# Patient Record
Sex: Female | Born: 1955
Health system: Southern US, Community
[De-identification: ages and names within clinical notes are randomized; demographics above are authoritative.]

## PROBLEM LIST (undated history)

## (undated) DIAGNOSIS — R7303 Prediabetes: Secondary | ICD-10-CM

## (undated) DIAGNOSIS — K219 Gastro-esophageal reflux disease without esophagitis: Secondary | ICD-10-CM

## (undated) DIAGNOSIS — I10 Essential (primary) hypertension: Secondary | ICD-10-CM

## (undated) DIAGNOSIS — G8929 Other chronic pain: Secondary | ICD-10-CM

## (undated) DIAGNOSIS — Z114 Encounter for screening for human immunodeficiency virus [HIV]: Secondary | ICD-10-CM

## (undated) DIAGNOSIS — I700 Atherosclerosis of aorta: Secondary | ICD-10-CM

## (undated) DIAGNOSIS — M1990 Unspecified osteoarthritis, unspecified site: Secondary | ICD-10-CM

## (undated) DIAGNOSIS — F909 Attention-deficit hyperactivity disorder, unspecified type: Secondary | ICD-10-CM

## (undated) DIAGNOSIS — N179 Acute kidney failure, unspecified: Secondary | ICD-10-CM

## (undated) DIAGNOSIS — M87242 Osteonecrosis due to previous trauma, left hand: Secondary | ICD-10-CM

## (undated) DIAGNOSIS — I272 Pulmonary hypertension, unspecified: Secondary | ICD-10-CM

## (undated) DIAGNOSIS — A6009 Herpesviral infection of other urogenital tract: Secondary | ICD-10-CM

## (undated) DIAGNOSIS — E038 Other specified hypothyroidism: Secondary | ICD-10-CM

## (undated) DIAGNOSIS — Z8719 Personal history of other diseases of the digestive system: Secondary | ICD-10-CM

## (undated) DIAGNOSIS — E785 Hyperlipidemia, unspecified: Secondary | ICD-10-CM

## (undated) DIAGNOSIS — I2510 Atherosclerotic heart disease of native coronary artery without angina pectoris: Secondary | ICD-10-CM

## (undated) DIAGNOSIS — F419 Anxiety disorder, unspecified: Secondary | ICD-10-CM

## (undated) DIAGNOSIS — M549 Dorsalgia, unspecified: Secondary | ICD-10-CM

## (undated) DIAGNOSIS — Z8711 Personal history of peptic ulcer disease: Secondary | ICD-10-CM

## (undated) DIAGNOSIS — R6889 Other general symptoms and signs: Secondary | ICD-10-CM

## (undated) DIAGNOSIS — IMO0002 Reserved for concepts with insufficient information to code with codable children: Secondary | ICD-10-CM

## (undated) HISTORY — DX: Atherosclerosis of aorta: I70.0

## (undated) HISTORY — PX: TONSILLECTOMY: SUR1361

## (undated) HISTORY — DX: Other specified hypothyroidism: E03.8

## (undated) HISTORY — DX: Herpesviral infection of other urogenital tract: A60.09

## (undated) HISTORY — DX: Hyperlipidemia, unspecified: E78.5

## (undated) HISTORY — PX: CARDIAC CATHETERIZATION: SHX172

## (undated) HISTORY — DX: Reserved for concepts with insufficient information to code with codable children: IMO0002

## (undated) HISTORY — DX: Other chronic pain: G89.29

## (undated) HISTORY — DX: Pulmonary hypertension, unspecified: I27.20

## (undated) HISTORY — DX: Gastro-esophageal reflux disease without esophagitis: K21.9

## (undated) HISTORY — DX: Atherosclerotic heart disease of native coronary artery without angina pectoris: I25.10

## (undated) HISTORY — PX: COLON SURGERY: SHX602

## (undated) HISTORY — DX: Essential (primary) hypertension: I10

## (undated) HISTORY — DX: Prediabetes: R73.03

## (undated) HISTORY — DX: Unspecified osteoarthritis, unspecified site: M19.90

## (undated) HISTORY — DX: Attention-deficit hyperactivity disorder, unspecified type: F90.9

## (undated) HISTORY — PX: APPENDECTOMY: SHX54

## (undated) HISTORY — PX: EYE SURGERY: SHX253

## (undated) HISTORY — PX: CORONARY ANGIOPLASTY: SHX604

## (undated) HISTORY — PX: SMALL INTESTINE SURGERY: SHX150

## (undated) HISTORY — DX: Osteonecrosis due to previous trauma, left hand: M87.242

## (undated) HISTORY — DX: Other general symptoms and signs: R68.89

## (undated) HISTORY — DX: Dorsalgia, unspecified: M54.9

## (undated) HISTORY — DX: Acute kidney failure, unspecified: N17.9

## (undated) HISTORY — DX: Anxiety disorder, unspecified: F41.9

## (undated) HISTORY — PX: TUBAL LIGATION: SHX77

---

## 2003-05-23 ENCOUNTER — Other Ambulatory Visit: Payer: Self-pay

## 2004-02-15 ENCOUNTER — Emergency Department: Payer: Self-pay | Admitting: General Practice

## 2004-02-15 ENCOUNTER — Other Ambulatory Visit: Payer: Self-pay

## 2004-08-10 ENCOUNTER — Emergency Department: Payer: Self-pay | Admitting: Emergency Medicine

## 2004-11-24 ENCOUNTER — Inpatient Hospital Stay: Payer: Self-pay | Admitting: Internal Medicine

## 2004-11-24 ENCOUNTER — Other Ambulatory Visit: Payer: Self-pay

## 2004-11-25 ENCOUNTER — Other Ambulatory Visit: Payer: Self-pay

## 2006-04-02 ENCOUNTER — Emergency Department: Payer: Self-pay | Admitting: Unknown Physician Specialty

## 2007-04-01 ENCOUNTER — Emergency Department: Payer: Self-pay | Admitting: Emergency Medicine

## 2007-05-06 ENCOUNTER — Other Ambulatory Visit: Payer: Self-pay

## 2007-05-06 ENCOUNTER — Emergency Department: Payer: Self-pay | Admitting: Internal Medicine

## 2007-08-31 ENCOUNTER — Inpatient Hospital Stay (HOSPITAL_COMMUNITY): Admission: EM | Admit: 2007-08-31 | Discharge: 2007-09-01 | Payer: Self-pay | Admitting: Emergency Medicine

## 2007-08-31 ENCOUNTER — Ambulatory Visit: Payer: Self-pay | Admitting: Cardiology

## 2008-05-12 ENCOUNTER — Inpatient Hospital Stay: Payer: Self-pay | Admitting: Psychiatry

## 2008-11-27 ENCOUNTER — Emergency Department: Payer: Self-pay | Admitting: Emergency Medicine

## 2009-01-16 ENCOUNTER — Emergency Department (HOSPITAL_COMMUNITY): Admission: EM | Admit: 2009-01-16 | Discharge: 2009-01-16 | Payer: Self-pay | Admitting: Emergency Medicine

## 2009-09-15 ENCOUNTER — Inpatient Hospital Stay (HOSPITAL_COMMUNITY): Admission: EM | Admit: 2009-09-15 | Discharge: 2009-09-18 | Payer: Self-pay | Admitting: Emergency Medicine

## 2009-09-15 ENCOUNTER — Ambulatory Visit: Payer: Self-pay | Admitting: Internal Medicine

## 2009-09-16 ENCOUNTER — Encounter: Payer: Self-pay | Admitting: Cardiovascular Disease

## 2009-09-18 ENCOUNTER — Telehealth: Payer: Self-pay | Admitting: Cardiovascular Disease

## 2009-09-21 ENCOUNTER — Inpatient Hospital Stay (HOSPITAL_COMMUNITY): Admission: EM | Admit: 2009-09-21 | Discharge: 2009-09-23 | Payer: Self-pay | Admitting: Emergency Medicine

## 2009-09-21 ENCOUNTER — Ambulatory Visit: Payer: Self-pay | Admitting: Cardiology

## 2009-10-02 DIAGNOSIS — I2510 Atherosclerotic heart disease of native coronary artery without angina pectoris: Secondary | ICD-10-CM | POA: Insufficient documentation

## 2009-10-02 DIAGNOSIS — I10 Essential (primary) hypertension: Secondary | ICD-10-CM | POA: Insufficient documentation

## 2009-10-02 DIAGNOSIS — I25119 Atherosclerotic heart disease of native coronary artery with unspecified angina pectoris: Secondary | ICD-10-CM | POA: Insufficient documentation

## 2009-10-03 ENCOUNTER — Ambulatory Visit: Payer: Self-pay | Admitting: Cardiovascular Disease

## 2009-10-03 DIAGNOSIS — E785 Hyperlipidemia, unspecified: Secondary | ICD-10-CM | POA: Insufficient documentation

## 2009-10-23 ENCOUNTER — Emergency Department (HOSPITAL_COMMUNITY): Admission: EM | Admit: 2009-10-23 | Discharge: 2009-10-23 | Payer: Self-pay | Admitting: Emergency Medicine

## 2009-11-05 ENCOUNTER — Encounter: Payer: Self-pay | Admitting: Cardiovascular Disease

## 2009-11-07 ENCOUNTER — Encounter: Payer: Self-pay | Admitting: Cardiovascular Disease

## 2009-11-08 ENCOUNTER — Inpatient Hospital Stay: Payer: Medicare Other | Admitting: General Surgery

## 2009-11-08 ENCOUNTER — Ambulatory Visit: Payer: Self-pay | Admitting: Cardiovascular Disease

## 2009-11-09 LAB — PATHOLOGY REPORT

## 2009-11-16 ENCOUNTER — Encounter: Payer: Self-pay | Admitting: Cardiovascular Disease

## 2009-11-22 ENCOUNTER — Ambulatory Visit: Payer: Medicare Other | Admitting: Oncology

## 2009-11-22 ENCOUNTER — Inpatient Hospital Stay: Payer: Medicare Other | Admitting: General Surgery

## 2009-11-26 ENCOUNTER — Encounter: Payer: Self-pay | Admitting: Cardiovascular Disease

## 2009-11-27 ENCOUNTER — Encounter: Payer: Self-pay | Admitting: Cardiovascular Disease

## 2009-11-29 ENCOUNTER — Encounter: Payer: Self-pay | Admitting: Cardiovascular Disease

## 2009-12-13 ENCOUNTER — Ambulatory Visit: Payer: Medicare Other | Admitting: Oncology

## 2010-01-15 ENCOUNTER — Ambulatory Visit: Payer: Self-pay | Admitting: Cardiovascular Disease

## 2010-01-18 ENCOUNTER — Encounter: Payer: Self-pay | Admitting: Cardiovascular Disease

## 2010-01-31 ENCOUNTER — Telehealth: Payer: Self-pay | Admitting: Cardiovascular Disease

## 2010-02-06 ENCOUNTER — Telehealth: Payer: Self-pay | Admitting: Cardiovascular Disease

## 2010-02-22 ENCOUNTER — Emergency Department: Payer: Medicare Other | Admitting: Emergency Medicine

## 2010-03-29 ENCOUNTER — Emergency Department: Payer: Self-pay | Admitting: Emergency Medicine

## 2010-04-02 ENCOUNTER — Telehealth: Payer: Self-pay | Admitting: Cardiovascular Disease

## 2010-04-29 ENCOUNTER — Telehealth: Payer: Self-pay | Admitting: Cardiovascular Disease

## 2010-05-05 ENCOUNTER — Emergency Department: Payer: Medicare Other | Admitting: Unknown Physician Specialty

## 2010-05-12 LAB — CONVERTED CEMR LAB
ALT: 62 units/L — ABNORMAL HIGH (ref 0–35)
AST: 66 units/L — ABNORMAL HIGH (ref 0–37)
Albumin: 4 g/dL (ref 3.5–5.2)
Alkaline Phosphatase: 73 units/L (ref 39–117)
Bilirubin, Direct: <0.1 mg/dL (ref 0.0–0.3)
Cholesterol: 220 mg/dL — ABNORMAL HIGH (ref 0–200)
HDL: 65 mg/dL (ref >39)
LDL Cholesterol: 138 mg/dL — ABNORMAL HIGH (ref 0–99)
Total Bilirubin: 0.2 mg/dL — ABNORMAL LOW (ref 0.3–1.2)
Total CHOL/HDL Ratio: 3.4
Total Protein: 6.6 g/dL (ref 6.0–8.3)
Triglycerides: 83 mg/dL (ref <150)
VLDL: 17 mg/dL (ref 0–40)

## 2010-05-14 NOTE — Letter (Signed)
SummaryScientist, [facility]   [facility]   Imported By: Roderic Ovens 01/28/2010 10:54:04  _____________________________________________________________________  External Attachment:    Type:   Image     Comment:   External Document

## 2010-05-14 NOTE — Assessment & Plan Note (Signed)
Summary: HOSPITAL FU/LIPID/LIVER-AMD   Visit Type:  Follow-up Primary Provider:  Lacie Scotts  CC:  s/p F/U [facility]. c/o shortness of breath with little exertion.Marland Kitchen  History of Present Illness: Ms. Linda Mejia is a very pleasant 55 year old woman with past medical history of coronary artery disease, stent to her LAD in 1999 with occluded RCA, chest pain leading to cardiac catheter on June 6 with 2.75 x 18 mm promus stent placed to her LAD prior to the previous stent also with 30% left circumflex disease and known occluded RCA also noted to have moderate to severe aortic disease, history of DJD with radiating neck arm pain On pain medication, recent hospitalization for ruptured appendix requiring a long course of antibiotics As an inpatient with laparoscopic resection, with readmission for intussusception, presenting for routine followup.  overall she has been doing well recently. Since her discharge, she reports no further GI complications. She has no chest pain, no significant shortness of breath or chest discomfort.   She has a very strong family history of coronary artery disease, 3 of her siblings have died.  old EKG shows normal sinus rhythm with rate 68 beats per minute, no significant ST or T wave changes.    Current Medications (verified): 1)  Diazepam 10 Mg Tabs (Diazepam) .... Three Times A Day 2)  Aspirin 325 Mg Tabs (Aspirin) .Marland Kitchen.. 1 Qd 3)  Nitrostat 0.4 Mg Subl (Nitroglycerin) .Marland Kitchen.. 1 Tablet Under Tongue At Onset of Chest Pain; You May Repeat Every 5 Minutes For Up To 3 Doses. 4)  Plavix 75 Mg Tabs (Clopidogrel Bisulfate) .... Take One Tablet By Mouth Daily 5)  Zolpidem Tartrate 10 Mg Tabs (Zolpidem Tartrate) .Marland Kitchen.. 1qhs 6)  Cymbalta 30 Mg Cpep (Duloxetine Hcl) .... 3 Tablets Once Daily 7)  Adderall 12.5 Mg Tabs (Amphetamine-Dextroamphetamine) .... 2 Tablets Two Times A Day 8)  Fioricet 50-325-40 Mg Tabs (Butalbital-Apap-Caffeine) .... 4 X Once Daily As Needed 9)  Flexeril 10 Mg Tabs  (Cyclobenzaprine Hcl) .... 3 Tablets Once Daily 10)  Pravastatin Sodium 80 Mg Tabs (Pravastatin Sodium) .... Take One Tablet By Mouth Daily At Bedtime 11)  Metoprolol Tartrate 50 Mg Tabs (Metoprolol Tartrate) .... One Tablet Two Times A Day  Allergies (verified): No Known Drug Allergies  Past History:  Past Medical History: Last updated: 10/02/2009 Anxiety CAD Hypertension ADHD Chronic back pain  Past Surgical History: Last updated: 10/02/2009 C-section Tubal ligation.  Cardiac Catheterization Tonsillectomy  Family History: Last updated: 10/03/2009 Family History of Coronary Artery Disease: father Family History of Diabetes: Mother   Social History: Last updated: 10/03/2009 Widowed  Tobacco Use - Former. 1ppd x 30 yrs Alcohol Use - no Drug Use - no Regular Exercise - no  Risk Factors: Exercise: no (10/03/2009)  Risk Factors: Smoking Status: quit (10/02/2009)  Review of Systems  The patient denies fever, weight loss, weight gain, vision loss, decreased hearing, hoarseness, chest pain, syncope, dyspnea on exertion, peripheral edema, prolonged cough, abdominal pain, incontinence, muscle weakness, depression, and enlarged lymph nodes.    Vital Signs:  Patient profile:   55 year old female Height:      66 inches Weight:      167 pounds BMI:     27.05 Pulse rate:   75 / minute BP sitting:   120 / 80  (left arm) Cuff size:   regular  Vitals Entered By: Bishop Dublin, CMA (January 15, 2010 10:00 AM)  Physical Exam  General:  Well developed, well nourished, in no acute distress. Head:  normocephalic  and atraumatic Neck:  Neck supple, no JVD. No masses, thyromegaly or abnormal cervical nodes. Lungs:  Clear bilaterally to auscultation and percussion. Heart:  Non-displaced PMI, chest non-tender; regular rate and rhythm, S1, S2 without murmurs, rubs or gallops. Carotid upstroke normal, no bruit. . Pedals normal pulses. No edema, no varicosities. Abdomen:  Bowel  sounds positive; abdomen soft and non-tender without masses Msk:  Back normal, normal gait. Muscle strength and tone normal. Pulses:  pulses normal in all 4 extremities Extremities:  No clubbing or cyanosis. Neurologic:  Alert and oriented x 3. Skin:  Intact without lesions or rashes. Psych:  Normal affect.   Impression & Recommendations:  Problem # 1:  CAD, NATIVE VESSEL (ICD-414.01) recent stent to her LAD over 3 months ago. No symptoms of angina. We will decrease her aspirin to 81 mg x2 with her Plavix.  The following medications were removed from the medication list:    Amlodipine Besylate 2.5 Mg Tabs (Amlodipine besylate) .Marland Kitchen... Take one tablet by mouth daily    Atenolol 25 Mg Tabs (Atenolol) .Marland Kitchen... Take one tablet by mouth daily Her updated medication list for this problem includes:    Aspirin 325 Mg Tabs (Aspirin) .Marland Kitchen... 1 qd    Nitrostat 0.4 Mg Subl (Nitroglycerin) .Marland Kitchen... 1 tablet under tongue at onset of chest pain; you may repeat every 5 minutes for up to 3 doses.    Plavix 75 Mg Tabs (Clopidogrel bisulfate) .Marland Kitchen... Take one tablet by mouth daily    Metoprolol Tartrate 50 Mg Tabs (Metoprolol tartrate) ..... One tablet two times a day  Problem # 2:  HYPERLIPIDEMIA-MIXED (ICD-272.4) We will check her cholesterol and LFTs today. Goal is LDL less than 70.  Her updated medication list for this problem includes:    Pravastatin Sodium 80 Mg Tabs (Pravastatin sodium) .Marland Kitchen... Take one tablet by mouth daily at bedtime  Problem # 3:  HYPERTENSION, UNSPECIFIED (ICD-401.9) Blood pressure is well-controlled on her current medication regimen.  The following medications were removed from the medication list:    Amlodipine Besylate 2.5 Mg Tabs (Amlodipine besylate) .Marland Kitchen... Take one tablet by mouth daily    Atenolol 25 Mg Tabs (Atenolol) .Marland Kitchen... Take one tablet by mouth daily Her updated medication list for this problem includes:    Aspirin 325 Mg Tabs (Aspirin) .Marland Kitchen... 1 qd    Metoprolol Tartrate 50  Mg Tabs (Metoprolol tartrate) ..... One tablet two times a day

## 2010-05-14 NOTE — Op Note (Signed)
SummaryScientist, physiological [facility]   [facility]   Imported By: Roderic Ovens 01/28/2010 10:49:27  _____________________________________________________________________  External Attachment:    Type:   Image     Comment:   External Document

## 2010-05-14 NOTE — Assessment & Plan Note (Signed)
Summary: POST HOSPITAL F/U 2 WEEK   Visit Type:  Initial Consult Primary Provider:  Lacie Scotts  CC:  Very fatigue and left arm pain(has a pinched nerve in neck).Marland Kitchen  History of Present Illness: Ms. Linda Mejia is a very pleasant 55 year old woman with past medical history of coronary artery disease, stent to her LAD in 1999 with occluded RCA, chest pain leading to cardiac catheter on June 6 with 2.75 x 18 mm promus stent placed to her LAD prior to the previous stent also with 30% left circumflex disease and known occluded RCA also noted to have moderate to severe aortic disease, history of DJD with radiating neck arm pain presents for routine followup after recent stenting.  She did have an episode of chest pain though ruled out last week and has not had any further episodes of chest pain and has been active. She does have nitroglycerin no has not been taking it. She has been more compliant with her medications including her aspirin, Plavix, pravastatin 80 mg daily. She does not do any exercise plan. She is on disability currently and is interested in doing the cardiac rehabilitation.  She has a very strong family history of coronary artery disease, 3 of her siblings have died.  EKG shows normal sinus rhythm with rate 68 beats per minute, no significant ST or T wave changes.    Preventive Screening-Counseling & Management  Caffeine-Diet-Exercise     Does Patient Exercise: no  Current Medications (verified): 1)  Diazepam 10 Mg Tabs (Diazepam) .... Three Times A Day 2)  Aspirin 325 Mg Tabs (Aspirin) .Marland Kitchen.. 1 Qd 3)  Nitrostat 0.4 Mg Subl (Nitroglycerin) .Marland Kitchen.. 1 Tablet Under Tongue At Onset of Chest Pain; You May Repeat Every 5 Minutes For Up To 3 Doses. 4)  Plavix 75 Mg Tabs (Clopidogrel Bisulfate) .... Take One Tablet By Mouth Daily 5)  Amlodipine Besylate 2.5 Mg Tabs (Amlodipine Besylate) .... Take One Tablet By Mouth Daily 6)  Atenolol 25 Mg Tabs (Atenolol) .... Take One Tablet By Mouth Daily 7)   Zolpidem Tartrate 10 Mg Tabs (Zolpidem Tartrate) .Marland Kitchen.. 1qhs 8)  Cymbalta 30 Mg Cpep (Duloxetine Hcl) .... 3 Tablets Once Daily 9)  Adderall 12.5 Mg Tabs (Amphetamine-Dextroamphetamine) .... 2 Tablets Two Times A Day 10)  Fioricet 50-325-40 Mg Tabs (Butalbital-Apap-Caffeine) .... 4 X Once Daily As Needed 11)  Flexeril 10 Mg Tabs (Cyclobenzaprine Hcl) .... 3 Tablets Once Daily 12)  Pravastatin Sodium 80 Mg Tabs (Pravastatin Sodium) .... Take One Tablet By Mouth Daily At Bedtime  Allergies (verified): No Known Drug Allergies  Past History:  Past Medical History: Last updated: 10/02/2009 Anxiety CAD Hypertension ADHD Chronic back pain  Past Surgical History: Last updated: 10/02/2009 C-section Tubal ligation.  Cardiac Catheterization Tonsillectomy  Family History: Last updated: 10/03/2009 Family History of Coronary Artery Disease: father Family History of Diabetes: Mother   Social History: Last updated: 10/03/2009 Widowed  Tobacco Use - Former. 1ppd x 30 yrs Alcohol Use - no Drug Use - no Regular Exercise - no  Risk Factors: Exercise: no (10/03/2009)  Risk Factors: Smoking Status: quit (10/02/2009)  Family History: Family History of Coronary Artery Disease: father Family History of Diabetes: Mother   Social History: Widowed  Tobacco Use - Former. 1ppd x 30 yrs Alcohol Use - no Drug Use - no Regular Exercise - no Does Patient Exercise:  no  Review of Systems  The patient denies fever, weight loss, weight gain, vision loss, decreased hearing, hoarseness, chest pain, syncope, dyspnea on exertion, peripheral  edema, prolonged cough, abdominal pain, incontinence, muscle weakness, depression, and enlarged lymph nodes.    Vital Signs:  Patient profile:   55 year old female Height:      66 inches Weight:      164 pounds BMI:     26.57 Pulse rate:   68 / minute BP sitting:   120 / 80  (left arm) Cuff size:   regular  Vitals Entered By: Bishop Dublin, CMA (October 03, 2009 10:17 AM)  Physical Exam  General:  Well developed, well nourished, in no acute distress. Head:  normocephalic and atraumatic Neck:  Neck supple, no JVD. No masses, thyromegaly or abnormal cervical nodes. Lungs:  Clear bilaterally to auscultation and percussion. Heart:  Non-displaced PMI, chest non-tender; regular rate and rhythm, S1, S2 without murmurs, rubs or gallops. Carotid upstroke normal, no bruit. . Pedals normal pulses. No edema, no varicosities. Abdomen:  Bowel sounds positive; abdomen soft and non-tender without masses Msk:  Back normal, normal gait. Muscle strength and tone normal. Pulses:  pulses normal in all 4 extremities Extremities:  No clubbing or cyanosis. Neurologic:  Alert and oriented x 3. Skin:  Intact without lesions or rashes. Psych:  Normal affect.   Impression & Recommendations:  Problem # 1:  CAD, NATIVE VESSEL (ICD-414.01) coronary disease as previously detailed. No indication for testing at this time as she is symptom-free. We have stressed to her the importance of medication compliance.  Her updated medication list for this problem includes:    Aspirin 325 Mg Tabs (Aspirin) .Marland Kitchen... 1 qd    Nitrostat 0.4 Mg Subl (Nitroglycerin) .Marland Kitchen... 1 tablet under tongue at onset of chest pain; you may repeat every 5 minutes for up to 3 doses.    Plavix 75 Mg Tabs (Clopidogrel bisulfate) .Marland Kitchen... Take one tablet by mouth daily    Amlodipine Besylate 2.5 Mg Tabs (Amlodipine besylate) .Marland Kitchen... Take one tablet by mouth daily    Atenolol 25 Mg Tabs (Atenolol) .Marland Kitchen... Take one tablet by mouth daily  Problem # 2:  HYPERTENSION, UNSPECIFIED (ICD-401.9) Blood pressure is well controlled and we will continue her on her current medications. She reports that her primary care physician wanted to increase the Norvasc though given her systolic pressures 120 and she feels well with no symptoms, we will leave it at its current dose for now.  Her updated medication list for this problem  includes:    Aspirin 325 Mg Tabs (Aspirin) .Marland Kitchen... 1 qd    Amlodipine Besylate 2.5 Mg Tabs (Amlodipine besylate) .Marland Kitchen... Take one tablet by mouth daily    Atenolol 25 Mg Tabs (Atenolol) .Marland Kitchen... Take one tablet by mouth daily  Problem # 3:  HYPERLIPIDEMIA-MIXED (ICD-272.4) Wwill check her liver and lipids in 3 months time either through our office or through her PMDs office. goal LDL is less than 70.  The following medications were removed from the medication list:    Crestor 10 Mg Tabs (Rosuvastatin calcium) .Marland Kitchen... Take one tablet by mouth daily. Her updated medication list for this problem includes:    Pravastatin Sodium 80 Mg Tabs (Pravastatin sodium) .Marland Kitchen... Take one tablet by mouth daily at bedtime  Patient Instructions: 1)  Your physician recommends that you return for a FASTING lipid profile: In 3 months (Lip/Liver) 2)  Your physician wants you to follow-up in:   6 months You will receive a reminder letter in the mail two months in advance. If you don't receive a letter, please call our office to schedule the follow-up  appointment.

## 2010-05-14 NOTE — Progress Notes (Signed)
Summary: BRUISING  Phone Note Call from Patient Call back at Home Phone [phone]   Caller: SELF Call For: Linda Mejia Summary of Call: PT HAD A CATH AND A STENT PUT IN ON 09/17/09-HAVING SEVERE BRUISING ON HER RIGHT FOOT-IT FEELS LIKE SOMEONE HAS TWISTED IT-NOTED IN THE DISCHARGE INSTRUCTIONS THAT THIS WAS SOMETHING THAT SHE NEEDED TO NOTIFY THE PHYSICIAN ABOUT Initial call taken by: Harlon Flor,  September 18, 2009 3:36 PM  Follow-up for Phone Call        talked to pt bruise was on the top of foot, no bruising around cath site, right and left foot same temp.  Advised pt to watch her cath site for bruising or bleeding and if still concerned to go to ER.  Follow-up by: Benedict Needy, RN,  September 18, 2009 4:34 PM

## 2010-05-14 NOTE — Progress Notes (Signed)
Summary: cholesterol medication change  Phone Note Call from Patient   Caller: Patient Summary of Call: Was told to call regarding cholesterol results and to start a new medication for cholesterol.   Initial call taken by: Bishop Dublin, CMA,  January 31, 2010 4:36 PM  Follow-up for Phone Call        Phone Call Completed:  Spoke with patient about the new medication for cholesterol and she will pick up samples of crestor 20mg  tomorrow. Follow-up by: Bishop Dublin, CMA,  January 31, 2010 4:37 PM    New/Updated Medications: CRESTOR 20 MG TABS (ROSUVASTATIN CALCIUM) one tablet at bedtime

## 2010-05-14 NOTE — Progress Notes (Signed)
Summary: RETURNING CALL  Phone Note Call from Patient Call back at Home Phone [phone]   Caller: SELF Call For: [facility] Summary of Call: PT RETURNING A CALL TO ASHLEY-WOULD LIKE FOR HER TO LEAVE A MESSAGE ON THE MACHING REGARDING WHAT THE PHONE CALL IS ABOUT. Initial call taken by: Harlon Flor,  February 06, 2010 2:14 PM  Follow-up for Phone Call        [facility] Follow-up by: Benedict Needy, RN,  February 06, 2010 4:42 PM

## 2010-05-14 NOTE — Miscellaneous (Signed)
Summary: Rehab Report  Rehab Report   Imported By: [place] Carbo 11/05/2009 09:31:20  _____________________________________________________________________  External Attachment:    Type:   Image     Comment:   External Document

## 2010-05-14 NOTE — Letter (Signed)
Summary: Custom - Lipid  Architectural technologist at [address]   [place], [place] [address]   Phone: [phone]  Fax: [phone]     January 18, 2010 MRN: [id number]   Linda Mejia [address] [place], [place]  [address]   Dear Ms. Mejia,  We have reviewed your cholesterol results.  They are as follows:     Total Cholesterol:    220 (Desirable: less than 150)       HDL  Cholesterol:     65  (Desirable: greater than 40 for men and 50 for women)       LDL Cholesterol:       138  (Desirable: less than 70 for moderate to high risk)       Triglycerides:       83  (Desirable: less than 150)  Our recommendations include: PLEASE CALL OFFICE FOR MEDICATION RECOMMENDATIONS    Call our office at the number listed above if you have any questions.  Lowering your LDL cholesterol is important, but it is only one of a large number of "risk factors" that may indicate that you are at risk for heart disease, stroke or other complications of hardening of the arteries.  Other risk factors include:   A.  Cigarette Smoking* B.  High Blood Pressure* C.  Obesity* D.   Low HDL Cholesterol (see yours above)* E.   Diabetes Mellitus (higher risk if your is uncontrolled) F.  Family history of premature heart disease G.  Previous history of stroke or cardiovascular disease    *These are risk factors YOU HAVE CONTROL OVER.  For more information, visit .  There is now evidence that lowering the TOTAL CHOLESTEROL AND LDL CHOLESTEROL can reduce the risk of heart disease.  The American Heart Association recommends the following guidelines for the treatment of elevated cholesterol:  1.  If there is now current heart disease and less than two risk factors, TOTAL CHOLESTEROL should be less than 200 and LDL CHOLESTEROL should be less than 100. 2.  If there is current heart disease or two or more risk factors, TOTAL CHOLESTEROL should be less than 200 and LDL CHOLESTEROL should be less  than 70.  A diet low in cholesterol, saturated fat, and calories is the cornerstone of treatment for elevated cholesterol.  Cessation of smoking and exercise are also important in the management of elevated cholesterol and preventing vascular disease.  Studies have shown that 30 to 60 minutes of physical activity most days can help lower blood pressure, lower cholesterol, and keep your weight at a healthy level.  Drug therapy is used when cholesterol levels do not respond to therapeutic lifestyle changes (smoking cessation, diet, and exercise) and remains unacceptably high.  If medication is started, it is important to have you levels checked periodically to evaluate the need for further treatment options.  Thank you,    [employer] Team

## 2010-05-16 NOTE — Progress Notes (Signed)
Summary: GI symptoms  Phone Note Call from Patient Call back at [phone]   Caller: Patient Call For: NURSE/DR.GOLLAN Summary of Call: Pt called complaining of vomiting x 2 days, no BM x 3 days, bad taste in mouth, and feels "swollen". pt passed out friday in her bathroom and her daughter found her and called EMS. Recently had surgery on appendix and intestines with Dr.Sankar. Dr.Sankar won't see her this week because he will be out of town. pt wants to know if Dr.Gollan thinks she should see someone. please advise. Initial call taken by: Lysbeth Galas CMA,  April 02, 2010 3:20 PM  Follow-up for Phone Call        Spoke to pt, she states she had appendectomy 11/2009. She now c/o symptoms listed above and appt was made to see Dr. Evette Cristal this week, however, they called her back stating "Dr. Riley Churches his mind and will be out of town" and did not reschedule her with another physician. Informed pt that given symptoms she is presenting, she needs to f/u with GI or her pcp. She saw pcp (Dr. Glenis Smoker) yesterday and she feels he did not address her symptoms. Pt advised to drink plenty of fluids if she can keep down and take Miralax or MOM to help with BM and this may be why pt feels swollen (pt does not have hx of chf or edema). If problems persist pt advised to call Dr. Luan Moore office to see another MD or find another pcp that pt would like to see. Pt is ok with this. Follow-up by: Lanny Hurst RN,  April 02, 2010 4:11 PM

## 2010-05-16 NOTE — Progress Notes (Signed)
Summary: Surgical clearance  Phone Note From Other Clinic Call back at [phone]   Caller: [facility] Call For: [facility] Summary of Call: Needs surgical clearance for a colonoscopy. Initial call taken by: Harlon Flor,  April 29, 2010 9:58 AM  Follow-up for Phone Call        Pt had ov 01/2010. Does pt need f/u? Please advise. Follow-up by: Lanny Hurst RN,  April 29, 2010 11:40 AM  Additional Follow-up for Phone Call Additional follow up Details #1::        Should not come off plavix and ASA for a year from when the stent was placed. She had a drug eluting stent. Can come off 09/2010. for emergency, can come off plavix, keep on ASA. She should have follow up in 06/2010 if she has no new problems (6 months)     Appended Document: Surgical clearance Faxed [facility] note to [facility]. Will schedule f/u for pt.  Appended Document: Surgical clearance Attempted to call pt [facility] TCB /MES  Appended Document: Surgical clearance Spoke to pt, she states she absolutely cannot wait until June 2012 for colonoscopy because she is having sever GI problems. Notified pt the risks of stopping Plavix prior to 1 year after stent placed and that as noted above for emergencies she could stop Plavix but to stay on ASA. Pt understands. Pt states she has no new cardiac problems and would like to move 06/2010 appt to 09/2010.  Appended Document: Surgical clearance reminder put in for 09/2010 for 1 year post cath.sab

## 2010-05-30 ENCOUNTER — Emergency Department (HOSPITAL_COMMUNITY): Payer: Medicare Other

## 2010-05-30 ENCOUNTER — Inpatient Hospital Stay (HOSPITAL_COMMUNITY)
Admission: EM | Admit: 2010-05-30 | Discharge: 2010-05-31 | DRG: 287 | Disposition: A | Discharge status: Home / Self Care | Payer: Medicare Other | Attending: Cardiology | Admitting: Cardiology

## 2010-05-30 DIAGNOSIS — Z9861 Coronary angioplasty status: Secondary | ICD-10-CM

## 2010-05-30 DIAGNOSIS — I10 Essential (primary) hypertension: Secondary | ICD-10-CM | POA: Diagnosis present

## 2010-05-30 DIAGNOSIS — E785 Hyperlipidemia, unspecified: Secondary | ICD-10-CM | POA: Diagnosis present

## 2010-05-30 DIAGNOSIS — I2510 Atherosclerotic heart disease of native coronary artery without angina pectoris: Secondary | ICD-10-CM | POA: Diagnosis present

## 2010-05-30 DIAGNOSIS — R079 Chest pain, unspecified: Secondary | ICD-10-CM

## 2010-05-30 DIAGNOSIS — I252 Old myocardial infarction: Secondary | ICD-10-CM

## 2010-05-30 DIAGNOSIS — Z87891 Personal history of nicotine dependence: Secondary | ICD-10-CM

## 2010-05-30 DIAGNOSIS — R0789 Other chest pain: Principal | ICD-10-CM | POA: Diagnosis present

## 2010-05-30 LAB — POCT CARDIAC MARKERS
CKMB, poc: <1 ng/mL — ABNORMAL LOW (ref 1.0–8.0)
CKMB, poc: 1.1 ng/mL (ref 1.0–8.0)
Myoglobin, poc: 44.4 ng/mL (ref 12–200)
Myoglobin, poc: 60.1 ng/mL (ref 12–200)
Troponin i, poc: <0.05 ng/mL (ref 0.00–0.09)
Troponin i, poc: <0.05 ng/mL (ref 0.00–0.09)

## 2010-05-30 LAB — RAPID URINE DRUG SCREEN, HOSP PERFORMED
Amphetamines: POSITIVE — AB
Barbiturates: POSITIVE — AB
Benzodiazepines: POSITIVE — AB
Cocaine: NOT DETECTED
Opiates: NOT DETECTED
Tetrahydrocannabinol: NOT DETECTED

## 2010-05-30 LAB — DIFFERENTIAL
Basophils Absolute: 0 10*3/uL (ref 0.0–0.1)
Basophils Relative: 1 % (ref 0–1)
Eosinophils Absolute: 0.1 10*3/uL (ref 0.0–0.7)
Eosinophils Relative: 2 % (ref 0–5)
Lymphocytes Relative: 38 % (ref 12–46)
Lymphs Abs: 2.6 10*3/uL (ref 0.7–4.0)
Monocytes Absolute: 0.7 10*3/uL (ref 0.1–1.0)
Monocytes Relative: 10 % (ref 3–12)
Neutro Abs: 3.4 10*3/uL (ref 1.7–7.7)
Neutrophils Relative %: 50 % (ref 43–77)

## 2010-05-30 LAB — CBC
HCT: 33.4 % — ABNORMAL LOW (ref 36.0–46.0)
Hemoglobin: 11.2 g/dL — ABNORMAL LOW (ref 12.0–15.0)
MCH: 30.7 pg (ref 26.0–34.0)
MCHC: 33.5 g/dL (ref 30.0–36.0)
MCV: 91.5 fL (ref 78.0–100.0)
Platelets: 273 10*3/uL (ref 150–400)
RBC: 3.65 MIL/uL — ABNORMAL LOW (ref 3.87–5.11)
RDW: 13.7 % (ref 11.5–15.5)
WBC: 6.8 10*3/uL (ref 4.0–10.5)

## 2010-05-30 LAB — COMPREHENSIVE METABOLIC PANEL
ALT: 18 U/L (ref 0–35)
AST: 28 U/L (ref 0–37)
Albumin: 3.7 g/dL (ref 3.5–5.2)
Alkaline Phosphatase: 60 U/L (ref 39–117)
BUN: 7 mg/dL (ref 6–23)
CO2: 24 mEq/L (ref 19–32)
Calcium: 8.6 mg/dL (ref 8.4–10.5)
Chloride: 104 mEq/L (ref 96–112)
Creatinine, Ser: 0.67 mg/dL (ref 0.4–1.2)
GFR calc Af Amer: >60 mL/min (ref >60)
GFR calc non Af Amer: >60 mL/min (ref >60)
Glucose, Bld: 85 mg/dL (ref 70–99)
Potassium: 3.7 mEq/L (ref 3.5–5.1)
Sodium: 136 mEq/L (ref 135–145)
Total Bilirubin: 0.2 mg/dL — ABNORMAL LOW (ref 0.3–1.2)
Total Protein: 6.8 g/dL (ref 6.0–8.3)

## 2010-05-30 LAB — POCT I-STAT, CHEM 8
BUN: 8 mg/dL (ref 6–23)
Calcium, Ion: 1.01 mmol/L — ABNORMAL LOW (ref 1.12–1.32)
Chloride: 104 mEq/L (ref 96–112)
Creatinine, Ser: 0.8 mg/dL (ref 0.4–1.2)
Glucose, Bld: 83 mg/dL (ref 70–99)
HCT: 34 % — ABNORMAL LOW (ref 36.0–46.0)
Hemoglobin: 11.6 g/dL — ABNORMAL LOW (ref 12.0–15.0)
Potassium: 3.8 mEq/L (ref 3.5–5.1)
Sodium: 138 mEq/L (ref 135–145)
TCO2: 22 mmol/L (ref 0–100)

## 2010-05-30 LAB — D-DIMER, QUANTITATIVE: D-Dimer, Quant: 0.24 ug/mL-FEU (ref 0.00–0.48)

## 2010-05-30 LAB — CK TOTAL AND CKMB (NOT AT ARMC)
CK, MB: 1.6 ng/mL (ref 0.3–4.0)
Relative Index: 0.6 (ref 0.0–2.5)
Total CK: 261 U/L — ABNORMAL HIGH (ref 7–177)

## 2010-05-30 LAB — TROPONIN I: Troponin I: <0.01 ng/mL (ref 0.00–0.06)

## 2010-05-30 LAB — BRAIN NATRIURETIC PEPTIDE: Pro B Natriuretic peptide (BNP): <30 pg/mL (ref 0.0–100.0)

## 2010-05-31 DIAGNOSIS — I2510 Atherosclerotic heart disease of native coronary artery without angina pectoris: Secondary | ICD-10-CM

## 2010-05-31 DIAGNOSIS — R079 Chest pain, unspecified: Secondary | ICD-10-CM

## 2010-05-31 LAB — TSH: TSH: 4.277 u[IU]/mL (ref 0.350–4.500)

## 2010-05-31 LAB — LIPID PANEL
Cholesterol: 177 mg/dL (ref 0–200)
HDL: 51 mg/dL (ref >39)
LDL Cholesterol: 102 mg/dL — ABNORMAL HIGH (ref 0–99)
Total CHOL/HDL Ratio: 3.5 RATIO
Triglycerides: 118 mg/dL (ref <150)
VLDL: 24 mg/dL (ref 0–40)

## 2010-05-31 LAB — CARDIAC PANEL(CRET KIN+CKTOT+MB+TROPI)
CK, MB: 1.9 ng/mL (ref 0.3–4.0)
Relative Index: 0.7 (ref 0.0–2.5)
Total CK: 255 U/L — ABNORMAL HIGH (ref 7–177)
Troponin I: <0.01 ng/mL (ref 0.00–0.06)

## 2010-05-31 LAB — CBC
HCT: 35.3 % — ABNORMAL LOW (ref 36.0–46.0)
Hemoglobin: 11.6 g/dL — ABNORMAL LOW (ref 12.0–15.0)
MCH: 30.9 pg (ref 26.0–34.0)
MCHC: 32.9 g/dL (ref 30.0–36.0)
MCV: 94.1 fL (ref 78.0–100.0)
Platelets: 261 10*3/uL (ref 150–400)
RBC: 3.75 MIL/uL — ABNORMAL LOW (ref 3.87–5.11)
RDW: 14.2 % (ref 11.5–15.5)
WBC: 5.7 10*3/uL (ref 4.0–10.5)

## 2010-05-31 LAB — POCT ACTIVATED CLOTTING TIME: Activated Clotting Time: 81 seconds

## 2010-05-31 LAB — PROTIME-INR
INR: 0.87 (ref 0.00–1.49)
Prothrombin Time: 12 seconds (ref 11.6–15.2)

## 2010-05-31 LAB — HEMOGLOBIN A1C
Hgb A1c MFr Bld: 5.9 % — ABNORMAL HIGH (ref <5.7)
Mean Plasma Glucose: 123 mg/dL — ABNORMAL HIGH (ref <117)

## 2010-05-31 LAB — HEPARIN LEVEL (UNFRACTIONATED): Heparin Unfractionated: 0.15 IU/mL — ABNORMAL LOW (ref 0.30–0.70)

## 2010-05-31 LAB — MRSA PCR SCREENING: MRSA by PCR: NEGATIVE

## 2010-06-07 NOTE — Procedures (Signed)
  NAME:  Linda Mejia, Linda Mejia                 ACCOUNT NO.:  [id number]  MEDICAL RECORD NO.:  [id number]           PATIENT TYPE:  LOCATION:                                 FACILITY:  PHYSICIAN:  Marca Ancona, MD      DATE OF BIRTH:  1955/11/18  DATE OF PROCEDURE:  05/31/2010 DATE OF DISCHARGE:                           CARDIAC CATHETERIZATION   PROCEDURE: 1. Left heart catheterization. 2. Coronary angiography 3. Left ventriculography.  INDICATION:  This is a 55 year old who presented with chest heaviness and she does have a history of percutaneous coronary intervention to the LAD with in-stent restenosis.  PROCEDURE NOTE:  After informed consent was obtained, the patient underwent Allen testing of her right wrist which was negative, therefore we moved to the right femoral area.  The right femoral area was sterilely prepped and draped.  Lidocaine 1% was used to locally anesthetize the right femoral area.  The right common femoral artery was entered using modified Seldinger technique and a 5-French arterial sheath was placed.  The left ventricle was entered using the MP catheter.  The LAD was engaged using the MP catheter and the RCA was engaged using the MP catheter.  There were no complications.  FINDINGS: 1. Hemodynamics:  Aorta 124/71, LV 129/7. 2. Left ventriculography:  EF was estimated to be 55%.  There was     basal inferior hypokinesis. 3. Right coronary artery:  The right coronary artery was totally     occluded to the ostium.  The distal RCA and the PDA reconstituted     via left-to-right collaterals from the LAD system. 4. Left main:  There was no angiographic disease in left main. 5. Left circumflex system:  There were three obtuse marginals.  There     was about a 30% mid circumflex stenosis. 6. LAD system:  There was a long 30% to 40% smooth proximal LAD     stenosis.  This was followed in the proximal LAD by a stent.  The     stent was patent except for a 30% to 40%  distal in-stent     restenosis.  Following the stent, there was a moderate-sized first     diagonal.  The remainder of the LAD had no significant disease.  IMPRESSION:  The patient has patent left anterior descending stent.  She has known right coronary artery occlusion with good left-to-right collaterals.  There is no cause for chest pain noted.  We will plan on discharging her home today.     Marca Ancona, MD     DM/MEDQ  D:  05/31/2010  T:  05/31/2010  Job:  [id number]  cc:   Antonieta Iba, MD  Electronically Signed by Marca Ancona MD on 06/07/2010 10:40:18 AM

## 2010-06-15 NOTE — Discharge Summary (Signed)
NAME:  Linda Mejia, Linda Mejia                 ACCOUNT NO.:  [id number]  MEDICAL RECORD NO.:  [id number]           PATIENT TYPE:  I  LOCATION:  6525                         FACILITY:  [facility]  PHYSICIAN:  Cassell Clement, M.D. DATE OF BIRTH:  July 02, 1955  DATE OF ADMISSION:  05/30/2010 DATE OF DISCHARGE:  05/31/2010                              DISCHARGE SUMMARY   PRIMARY CARDIOLOGIST:  Antonieta Iba, MD  PRIMARY CARE PROVIDER:  [facility].  DISCHARGE DIAGNOSIS:  Chest pain without objective evidence of ischemia.  SECONDARY DIAGNOSES: 1. Coronary artery disease, status post bare-metal stenting of the     left anterior descending coronary artery in 1999. 2. Hypertension. 3. Hyperlipidemia. 4. Remote tobacco abuse quitting in 2009 after a 30-year history. 5. Attention deficit disorder. 6. History of panic attacks. 7. Status post C-section. 8. Status post tubal ligation. 9. Status post tonsillectomy. 10.Status post history of ruptured appendix. 11.Status post abdominal surgery. 12.Status post surgery for intussusception in the fall of 2011.  ALLERGIES:  No known drug allergies.  PROCEDURES:  Left heart cardiac catheterization revealing known total occlusion of the ostium of the right coronary artery.  The patient had nonobstructive disease in the proximal LAD including 40% in-stent restenosis within the distal portion of the stent.  Left circumflex has 30% stenosis.  There are left-to-right collaterals.  EF is 55% with inferior hypokinesis.  HISTORY OF PRESENT ILLNESS:  A 55 year old female with the above problem list, who was in her usual state of health until approximately 5 days prior to admission, when she began to experience dyspnea on exertion. On the day of admission, she developed substernal chest discomfort and heaviness radiating to the neck and back associated with nausea and dyspnea.  She presented to the [facility] ED where she was treated with aspirin,  nitroglycerin, and morphine with some, although not complete relief of discomfort.  She was admitted for further evaluation.  HOSPITAL COURSE:  The patient ruled out for MI.  ECG showed no acute changes.  She continued to have intermittent symptoms.  She underwent diagnostic catheterization this morning showing total occlusion of the right coronary artery, which is old with nonobstructive disease in the left coronary tree and left-to-right collaterals.  EF was 55% with inferior hypokinesis.  Continue medical therapy was recommended.  The patient has been ambulating post cath without recurrent symptoms or limitations and will be discharged home today in good condition.  DISCHARGE LABS:  Hemoglobin 11.6, hematocrit 35.3, WBC 5.7, platelets 261, INR 0.87, D-dimer 0.24.  Sodium 138, potassium 3.8, chloride 104, CO2 of 24, BUN 8, creatinine 0.8, glucose 83.  Total bilirubin 0.2, alkaline phosphatase 60, AST 28, ALT 18, total protein 6, albumin 3.7, calcium 8.6, hemoglobin A1c 5.9.  CK 255, MB 1.9, troponin I less than 0.01, total cholesterol 177, triglycerides 118, HDL 51, LDL 102, TSH 4.277.  Urine drug screen was positive for amphetamines, but the patient is on benzodiazepines.  MRSA screen was negative.  DISPOSITION:  The patient will be discharged home today in good condition.  FOLLOWUP PLANS AND APPOINTMENTS:  We will arrange for followup  with Dr. Mariah Milling in approximately 3-4 weeks.  She is asked to follow up with primary care provider in the next 1-2 weeks.  DISCHARGE MEDICATIONS: 1. Nitroglycerin 0.4 mg sublingual p.r.n. chest pain. 2. Adderall 30 mg 1/2 tablet daily. 3. Aspirin 81 mg daily. 4. Crestor 10 mg at bedtime. 5. Cymbalta 30 mg daily. 6. Fioricet 50/325/40 mg daily p.r.n. 7. Flexeril 10 mg 2 tablets at bedtime. 8. Metoprolol tartrate 100 mg b.i.d. 9. Plavix 75 mg daily. 10.Ropinirole 0.25 mg 2 tablets at bedtime.  OUTSTANDING LAB AND STUDIES:  None.  DURATION OF  DISCHARGE ENCOUNTER:  Forty-five minutes including physician time.     Linda Mejia, ANP   ______________________________ Cassell Clement, M.D.    CB/MEDQ  D:  05/31/2010  T:  06/01/2010  Job:  [id number]  cc:   [facility]  Electronically Signed by Linda Mejia ANP on 06/05/2010 02:06:35 PM Electronically Signed by Cassell Clement M.D. on 06/15/2010 07:54:44 AM

## 2010-06-15 NOTE — H&P (Signed)
NAME:  Linda Mejia, Linda Mejia                 ACCOUNT NO.:  [id number]  MEDICAL RECORD NO.:  [id number]           PATIENT TYPE:  I  LOCATION:  2918                         FACILITY:  [facility]  PHYSICIAN:  Cassell Clement, M.D. DATE OF BIRTH:  07-22-1955  DATE OF ADMISSION:  05/30/2010 DATE OF DISCHARGE:                             HISTORY & PHYSICAL   PRIMARY CARE PHYSICIAN:  [facility].  PRIMARY CARDIOLOGIST:  Antonieta Iba, MD  CHIEF COMPLAINT:  Chest pain.  HISTORY OF PRESENT ILLNESS:  Ms. Linda Mejia is a 55 year old female with a history of coronary artery disease.  She complains of approximately a 5- day history of dyspnea on exertion.  She also had some shortness of breath at rest.  She states these symptoms are new for her.  Today at approximately noon, she developed substernal chest pain that she describes as a heaviness.  Her symptoms persisted and it reached an 8/10.  She also has neck pain, back pain, and nausea.  She had some shortness of breath at rest.  She had no vomiting.  She had some diarrhea today, but this has been a chronic problem for her since she had abdominal surgery last fall.  In the emergency room, she had problems with an IV infiltrating, but received nitroglycerin paste, aspirin 81 mg x3, in addition to the baby aspirin she took at home, sublingual nitroglycerin, and 4 mg of morphine.  Currently, she is experiencing chest pain at a 5/10.  She has had no recent travel or immobility.  These symptoms are similar to her precath symptoms in June 2011, when she had percutaneous intervention.  She appears very anxious. She is concerned about her symptoms.  PAST MEDICAL HISTORY: 1. Status post stent to the LAD in 1999, reportedly secondary to an MI     and done at [facility] by Dr. Juliann Pares. 2. ADD and panic attacks. 3. Chronic back pain. 4. Hypertension. 5. Tobacco abuse. 6. Status post cardiac catheterization last in June 2011 with an 80-     90%  in-stent restenosis in the proximal portion of the stent,     treated with PTCA and 2.75 x 18 mm drug-eluting stent.  The     circumflex and OM had between 38 and 50% lesions.  The RCA was     totalled, but this was old.  There were left-to-left collaterals.     Her EF was 60%. 7. Family history of coronary artery disease.  SURGICAL HISTORY:  She is status post cardiac catheterizations as well as C-section, tubal ligation, tonsillectomy and abdominal surgery for a ruptured appendix and then another one for intussusception this past fall.  ALLERGIES:  LOPRESSOR.  CURRENT MEDICATIONS: 1. Adderall 12.5 mg daily. 2. Amlodipine 2.5 mg a day. 3. Aspirin 325 mg a day. 4. Atenolol 25 mg daily. 5. Crestor 10 mg daily 6. Cymbalta 30 mg a day. 7. Fioricet daily. 8. Sublingual nitroglycerin p.r.n. 9. Plavix 75 mg a day. 10.Ropinirole 0.25 mg two tablets nightly.  SOCIAL HISTORY:  [place]  She lives [place]. She quit tobacco in 2009 with almost  a 30-pack-year history.  She denies alcohol or drug abuse.  She is disabled.  FAMILY HISTORY:  Her father died in his 10s with a history of premature coronary artery disease and her mother is also deceased, but without coronary artery disease; however, multiple siblings have coronary artery disease in their 56s.  REVIEW OF SYSTEMS:  Significant for anxiety as well as chronic back pain.  She has occasional reflux symptoms, but no melena.  She has dyspnea on exertion as well as PND within the last week.  She has not had leg pain or cramping.  She has not been coughing or wheezing and has had no fevers.  Full 14-point review of systems is otherwise negative except as stated in the HPI.  PHYSICAL EXAMINATION:  VITAL SIGNS:  Temperature is 98.3, blood pressure 137/86, pulse 93, respiratory rate 20, O2 saturation 100% on room air. GENERAL:  She is a well-developed, well-nourished white female who appears anxious, but  otherwise in no distress. HEENT:  Normal. NECK:  There is no lymphadenopathy, thyromegaly, bruit or JVD noted. CV:  Her heart is regular in rate and rhythm with an S1-S2 and no significant murmur, rub or gallop is noted.  Distal pulses are intact in all four extremities. LUNGS:  Clear to auscultation bilaterally. SKIN:  No rashes or lesions are noted. ABDOMEN:  Soft and nontender with active bowel sounds. EXTREMITIES:  There is no cyanosis, clubbing or edema noted. NEUROLOGIC:  She is alert and oriented with cranial nerves II through XII grossly intact.  Chest x-ray; no acute disease.  EKG; sinus rhythm, rate 99 with no acute ischemic changes.  LABORATORY VALUES:  Hemoglobin 11.2, hematocrit 33.4, WBC 6.8, platelets 273.  Sodium 136, potassium 3.7, chloride 104, CO2 24, BUN 7, creatinine 0.67, glucose 85.  Point-of-care markers negative x2.  BNP less than 30.  IMPRESSION:  Ms. Linda Mejia was seen today by Dr. Patty Sermons, the patient evaluated and the data reviewed.  She is a 55 year old female with a history of known coronary artery disease.  She had a stent to the left anterior descending artery in 1999 and was treated with a drug-eluting stent for in-stent restenosis in June of this year.  She had vague symptoms of increased dyspnea and fatigue for several days.  Today at noon, she had onset of substernal chest pressure that was not exertional and has not been relieved since then despite nitroglycerin and morphine. She will be admitted for further evaluation.  We will add a D-dimer to her medication regimen.  We will start heparin.  We will continue the nitroglycerin paste.  With ongoing chest pain, she will be admitted to step-down.  We anticipate probable cath in a.m. to clarify her symptoms. She will be continued on all of her other home medications.     Theodore Demark, PA-C   ______________________________ Cassell Clement, M.D.    RB/MEDQ  D:  05/30/2010  T:   05/31/2010  Job:  [id number]  Electronically Signed by Theodore Demark PA-C on 06/05/2010 01:53:02 PM Electronically Signed by Cassell Clement M.D. on 06/15/2010 07:54:48 AM

## 2010-06-23 ENCOUNTER — Encounter: Payer: Self-pay | Admitting: Cardiovascular Disease

## 2010-06-24 ENCOUNTER — Encounter: Payer: Self-pay | Admitting: Cardiovascular Disease

## 2010-06-24 ENCOUNTER — Ambulatory Visit (INDEPENDENT_AMBULATORY_CARE_PROVIDER_SITE_OTHER): Payer: Medicare Other | Admitting: Cardiovascular Disease

## 2010-06-24 DIAGNOSIS — I2510 Atherosclerotic heart disease of native coronary artery without angina pectoris: Secondary | ICD-10-CM

## 2010-06-24 DIAGNOSIS — R079 Chest pain, unspecified: Secondary | ICD-10-CM

## 2010-06-24 DIAGNOSIS — E785 Hyperlipidemia, unspecified: Secondary | ICD-10-CM

## 2010-07-01 LAB — BASIC METABOLIC PANEL
BUN: 11 mg/dL (ref 6–23)
BUN: 12 mg/dL (ref 6–23)
BUN: 14 mg/dL (ref 6–23)
BUN: 16 mg/dL (ref 6–23)
BUN: 5 mg/dL — ABNORMAL LOW (ref 6–23)
CO2: 25 mEq/L (ref 19–32)
CO2: 26 mEq/L (ref 19–32)
CO2: 26 mEq/L (ref 19–32)
CO2: 27 mEq/L (ref 19–32)
CO2: 28 mEq/L (ref 19–32)
Calcium: 8.4 mg/dL (ref 8.4–10.5)
Calcium: 8.6 mg/dL (ref 8.4–10.5)
Calcium: 8.7 mg/dL (ref 8.4–10.5)
Calcium: 8.9 mg/dL (ref 8.4–10.5)
Calcium: 9.1 mg/dL (ref 8.4–10.5)
Chloride: 100 mEq/L (ref 96–112)
Chloride: 102 mEq/L (ref 96–112)
Chloride: 103 mEq/L (ref 96–112)
Chloride: 104 mEq/L (ref 96–112)
Chloride: 105 mEq/L (ref 96–112)
Creatinine, Ser: 0.49 mg/dL (ref 0.4–1.2)
Creatinine, Ser: 0.59 mg/dL (ref 0.4–1.2)
Creatinine, Ser: 0.59 mg/dL (ref 0.4–1.2)
Creatinine, Ser: 0.7 mg/dL (ref 0.4–1.2)
Creatinine, Ser: 0.74 mg/dL (ref 0.4–1.2)
GFR calc Af Amer: >60 mL/min (ref >60)
GFR calc Af Amer: >60 mL/min (ref >60)
GFR calc Af Amer: >60 mL/min (ref >60)
GFR calc Af Amer: >60 mL/min (ref >60)
GFR calc Af Amer: >60 mL/min (ref >60)
GFR calc non Af Amer: >60 mL/min (ref >60)
GFR calc non Af Amer: >60 mL/min (ref >60)
GFR calc non Af Amer: >60 mL/min (ref >60)
GFR calc non Af Amer: >60 mL/min (ref >60)
GFR calc non Af Amer: >60 mL/min (ref >60)
Glucose, Bld: 105 mg/dL — ABNORMAL HIGH (ref 70–99)
Glucose, Bld: 106 mg/dL — ABNORMAL HIGH (ref 70–99)
Glucose, Bld: 69 mg/dL — ABNORMAL LOW (ref 70–99)
Glucose, Bld: 86 mg/dL (ref 70–99)
Glucose, Bld: 99 mg/dL (ref 70–99)
Potassium: 3.7 mEq/L (ref 3.5–5.1)
Potassium: 3.8 mEq/L (ref 3.5–5.1)
Potassium: 3.8 mEq/L (ref 3.5–5.1)
Potassium: 3.8 mEq/L (ref 3.5–5.1)
Potassium: 4.2 mEq/L (ref 3.5–5.1)
Sodium: 134 mEq/L — ABNORMAL LOW (ref 135–145)
Sodium: 136 mEq/L (ref 135–145)
Sodium: 137 mEq/L (ref 135–145)
Sodium: 138 mEq/L (ref 135–145)
Sodium: 140 mEq/L (ref 135–145)

## 2010-07-01 LAB — COMPREHENSIVE METABOLIC PANEL
ALT: 22 U/L (ref 0–35)
AST: 21 U/L (ref 0–37)
Albumin: 3.7 g/dL (ref 3.5–5.2)
Alkaline Phosphatase: 70 U/L (ref 39–117)
BUN: 9 mg/dL (ref 6–23)
CO2: 27 mEq/L (ref 19–32)
Calcium: 8.4 mg/dL (ref 8.4–10.5)
Chloride: 108 mEq/L (ref 96–112)
Creatinine, Ser: 0.65 mg/dL (ref 0.4–1.2)
GFR calc Af Amer: >60 mL/min (ref >60)
GFR calc non Af Amer: >60 mL/min (ref >60)
Glucose, Bld: 89 mg/dL (ref 70–99)
Potassium: 3.5 mEq/L (ref 3.5–5.1)
Sodium: 139 mEq/L (ref 135–145)
Total Bilirubin: 0.8 mg/dL (ref 0.3–1.2)
Total Protein: 6.4 g/dL (ref 6.0–8.3)

## 2010-07-01 LAB — DIFFERENTIAL
Basophils Absolute: 0 10*3/uL (ref 0.0–0.1)
Basophils Absolute: 0 10*3/uL (ref 0.0–0.1)
Basophils Absolute: 0.1 10*3/uL (ref 0.0–0.1)
Basophils Absolute: 0.1 10*3/uL (ref 0.0–0.1)
Basophils Relative: 1 % (ref 0–1)
Basophils Relative: 1 % (ref 0–1)
Basophils Relative: 1 % (ref 0–1)
Basophils Relative: 1 % (ref 0–1)
Eosinophils Absolute: 0 10*3/uL (ref 0.0–0.7)
Eosinophils Absolute: 0.1 10*3/uL (ref 0.0–0.7)
Eosinophils Absolute: 0.1 10*3/uL (ref 0.0–0.7)
Eosinophils Absolute: 0.1 10*3/uL (ref 0.0–0.7)
Eosinophils Relative: 1 % (ref 0–5)
Eosinophils Relative: 1 % (ref 0–5)
Eosinophils Relative: 2 % (ref 0–5)
Eosinophils Relative: 2 % (ref 0–5)
Lymphocytes Relative: 26 % (ref 12–46)
Lymphocytes Relative: 34 % (ref 12–46)
Lymphocytes Relative: 41 % (ref 12–46)
Lymphocytes Relative: 48 % — ABNORMAL HIGH (ref 12–46)
Lymphs Abs: 1.1 10*3/uL (ref 0.7–4.0)
Lymphs Abs: 2 10*3/uL (ref 0.7–4.0)
Lymphs Abs: 2.5 10*3/uL (ref 0.7–4.0)
Lymphs Abs: 2.7 10*3/uL (ref 0.7–4.0)
Monocytes Absolute: 0.5 10*3/uL (ref 0.1–1.0)
Monocytes Absolute: 0.6 10*3/uL (ref 0.1–1.0)
Monocytes Absolute: 0.6 10*3/uL (ref 0.1–1.0)
Monocytes Absolute: 1.1 10*3/uL — ABNORMAL HIGH (ref 0.1–1.0)
Monocytes Relative: 10 % (ref 3–12)
Monocytes Relative: 11 % (ref 3–12)
Monocytes Relative: 13 % — ABNORMAL HIGH (ref 3–12)
Monocytes Relative: 17 % — ABNORMAL HIGH (ref 3–12)
Neutro Abs: 2.3 10*3/uL (ref 1.7–7.7)
Neutro Abs: 2.5 10*3/uL (ref 1.7–7.7)
Neutro Abs: 2.6 10*3/uL (ref 1.7–7.7)
Neutro Abs: 3.2 10*3/uL (ref 1.7–7.7)
Neutrophils Relative %: 39 % — ABNORMAL LOW (ref 43–77)
Neutrophils Relative %: 40 % — ABNORMAL LOW (ref 43–77)
Neutrophils Relative %: 54 % (ref 43–77)
Neutrophils Relative %: 60 % (ref 43–77)

## 2010-07-01 LAB — CBC
HCT: 33.2 % — ABNORMAL LOW (ref 36.0–46.0)
HCT: 34.6 % — ABNORMAL LOW (ref 36.0–46.0)
HCT: 35.7 % — ABNORMAL LOW (ref 36.0–46.0)
HCT: 36.5 % (ref 36.0–46.0)
HCT: 37.7 % (ref 36.0–46.0)
HCT: 38 % (ref 36.0–46.0)
HCT: 38.5 % (ref 36.0–46.0)
Hemoglobin: 11.5 g/dL — ABNORMAL LOW (ref 12.0–15.0)
Hemoglobin: 11.9 g/dL — ABNORMAL LOW (ref 12.0–15.0)
Hemoglobin: 12.1 g/dL (ref 12.0–15.0)
Hemoglobin: 12.7 g/dL (ref 12.0–15.0)
Hemoglobin: 13 g/dL (ref 12.0–15.0)
Hemoglobin: 13 g/dL (ref 12.0–15.0)
Hemoglobin: 13.2 g/dL (ref 12.0–15.0)
MCHC: 33.8 g/dL (ref 30.0–36.0)
MCHC: 34 g/dL (ref 30.0–36.0)
MCHC: 34.1 g/dL (ref 30.0–36.0)
MCHC: 34.3 g/dL (ref 30.0–36.0)
MCHC: 34.6 g/dL (ref 30.0–36.0)
MCHC: 34.8 g/dL (ref 30.0–36.0)
MCHC: 35.1 g/dL (ref 30.0–36.0)
MCV: 95 fL (ref 78.0–100.0)
MCV: 95.9 fL (ref 78.0–100.0)
MCV: 96.4 fL (ref 78.0–100.0)
MCV: 96.4 fL (ref 78.0–100.0)
MCV: 96.5 fL (ref 78.0–100.0)
MCV: 96.6 fL (ref 78.0–100.0)
MCV: 97.3 fL (ref 78.0–100.0)
Platelets: 288 10*3/uL (ref 150–400)
Platelets: 300 10*3/uL (ref 150–400)
Platelets: 311 10*3/uL (ref 150–400)
Platelets: 319 10*3/uL (ref 150–400)
Platelets: 345 10*3/uL (ref 150–400)
Platelets: 347 10*3/uL (ref 150–400)
Platelets: 373 10*3/uL (ref 150–400)
RBC: 3.45 MIL/uL — ABNORMAL LOW (ref 3.87–5.11)
RBC: 3.59 MIL/uL — ABNORMAL LOW (ref 3.87–5.11)
RBC: 3.7 MIL/uL — ABNORMAL LOW (ref 3.87–5.11)
RBC: 3.85 MIL/uL — ABNORMAL LOW (ref 3.87–5.11)
RBC: 3.93 MIL/uL (ref 3.87–5.11)
RBC: 3.95 MIL/uL (ref 3.87–5.11)
RBC: 3.96 MIL/uL (ref 3.87–5.11)
RDW: 13 % (ref 11.5–15.5)
RDW: 13 % (ref 11.5–15.5)
RDW: 13.1 % (ref 11.5–15.5)
RDW: 13.2 % (ref 11.5–15.5)
RDW: 13.3 % (ref 11.5–15.5)
RDW: 13.3 % (ref 11.5–15.5)
RDW: 13.3 % (ref 11.5–15.5)
WBC: 4.3 10*3/uL (ref 4.0–10.5)
WBC: 5.2 10*3/uL (ref 4.0–10.5)
WBC: 5.3 10*3/uL (ref 4.0–10.5)
WBC: 5.6 10*3/uL (ref 4.0–10.5)
WBC: 5.8 10*3/uL (ref 4.0–10.5)
WBC: 5.9 10*3/uL (ref 4.0–10.5)
WBC: 6.3 10*3/uL (ref 4.0–10.5)

## 2010-07-01 LAB — CK TOTAL AND CKMB (NOT AT ARMC)
CK, MB: 1 ng/mL (ref 0.3–4.0)
CK, MB: 1.3 ng/mL (ref 0.3–4.0)
Relative Index: 0.9 (ref 0.0–2.5)
Relative Index: INVALID (ref 0.0–2.5)
Total CK: 137 U/L (ref 7–177)
Total CK: 69 U/L (ref 7–177)

## 2010-07-01 LAB — CARDIAC PANEL(CRET KIN+CKTOT+MB+TROPI)
CK, MB: 0.8 ng/mL (ref 0.3–4.0)
CK, MB: 0.9 ng/mL (ref 0.3–4.0)
CK, MB: 0.9 ng/mL (ref 0.3–4.0)
CK, MB: 1 ng/mL (ref 0.3–4.0)
CK, MB: 1.4 ng/mL (ref 0.3–4.0)
CK, MB: 1.4 ng/mL (ref 0.3–4.0)
CK, MB: 1.5 ng/mL (ref 0.3–4.0)
Relative Index: 1 (ref 0.0–2.5)
Relative Index: 1.1 (ref 0.0–2.5)
Relative Index: 1.3 (ref 0.0–2.5)
Relative Index: INVALID (ref 0.0–2.5)
Relative Index: INVALID (ref 0.0–2.5)
Relative Index: INVALID (ref 0.0–2.5)
Relative Index: INVALID (ref 0.0–2.5)
Total CK: 112 U/L (ref 7–177)
Total CK: 127 U/L (ref 7–177)
Total CK: 134 U/L (ref 7–177)
Total CK: 57 U/L (ref 7–177)
Total CK: 61 U/L (ref 7–177)
Total CK: 75 U/L (ref 7–177)
Total CK: 95 U/L (ref 7–177)
Troponin I: 0.01 ng/mL (ref 0.00–0.06)
Troponin I: 0.02 ng/mL (ref 0.00–0.06)
Troponin I: 0.02 ng/mL (ref 0.00–0.06)
Troponin I: 0.02 ng/mL (ref 0.00–0.06)
Troponin I: 0.03 ng/mL (ref 0.00–0.06)
Troponin I: 0.03 ng/mL (ref 0.00–0.06)
Troponin I: <0.01 ng/mL (ref 0.00–0.06)

## 2010-07-01 LAB — HEMOGLOBIN A1C
Hgb A1c MFr Bld: 5.6 % (ref <5.7)
Mean Plasma Glucose: 114 mg/dL (ref <117)

## 2010-07-01 LAB — MRSA PCR SCREENING
MRSA by PCR: NEGATIVE
MRSA by PCR: NEGATIVE

## 2010-07-01 LAB — POCT CARDIAC MARKERS
CKMB, poc: 1.1 ng/mL (ref 1.0–8.0)
CKMB, poc: <1 ng/mL — ABNORMAL LOW (ref 1.0–8.0)
Myoglobin, poc: 21.9 ng/mL (ref 12–200)
Myoglobin, poc: 25 ng/mL (ref 12–200)
Troponin i, poc: <0.05 ng/mL (ref 0.00–0.09)
Troponin i, poc: <0.05 ng/mL (ref 0.00–0.09)

## 2010-07-01 LAB — MAGNESIUM: Magnesium: 2.2 mg/dL (ref 1.5–2.5)

## 2010-07-01 LAB — TROPONIN I
Troponin I: 0.05 ng/mL (ref 0.00–0.06)
Troponin I: <0.01 ng/mL (ref 0.00–0.06)

## 2010-07-01 LAB — PROTIME-INR
INR: 0.85 (ref 0.00–1.49)
INR: 0.88 (ref 0.00–1.49)
INR: 0.88 (ref 0.00–1.49)
Prothrombin Time: 11.5 seconds — ABNORMAL LOW (ref 11.6–15.2)
Prothrombin Time: 11.9 seconds (ref 11.6–15.2)
Prothrombin Time: 11.9 seconds (ref 11.6–15.2)

## 2010-07-01 LAB — HEPARIN LEVEL (UNFRACTIONATED)
Heparin Unfractionated: 0.34 IU/mL (ref 0.30–0.70)
Heparin Unfractionated: 0.47 IU/mL (ref 0.30–0.70)
Heparin Unfractionated: 0.52 IU/mL (ref 0.30–0.70)
Heparin Unfractionated: 0.63 IU/mL (ref 0.30–0.70)
Heparin Unfractionated: 0.65 IU/mL (ref 0.30–0.70)
Heparin Unfractionated: <0.1 IU/mL — ABNORMAL LOW (ref 0.30–0.70)

## 2010-07-01 LAB — LIPID PANEL
Cholesterol: 238 mg/dL — ABNORMAL HIGH (ref 0–200)
HDL: 93 mg/dL (ref >39)
LDL Cholesterol: 127 mg/dL — ABNORMAL HIGH (ref 0–99)
Total CHOL/HDL Ratio: 2.6 RATIO
Triglycerides: 92 mg/dL (ref <150)
VLDL: 18 mg/dL (ref 0–40)

## 2010-07-01 LAB — TSH: TSH: 1.944 u[IU]/mL (ref 0.350–4.500)

## 2010-07-01 LAB — C-REACTIVE PROTEIN: CRP: 1.1 mg/dL — ABNORMAL HIGH (ref <0.6)

## 2010-07-01 LAB — BRAIN NATRIURETIC PEPTIDE: Pro B Natriuretic peptide (BNP): <30 pg/mL (ref 0.0–100.0)

## 2010-07-01 LAB — APTT: aPTT: 23 seconds — ABNORMAL LOW (ref 24–37)

## 2010-07-01 LAB — SEDIMENTATION RATE: Sed Rate: 15 mm/hr (ref 0–22)

## 2010-07-02 NOTE — Assessment & Plan Note (Signed)
Summary: Cath/AMD   Visit Type:  Follow-up Primary Provider:  Lacie Scotts  CC:  F/U [facility].  c/o fatigue and shortness of breath and just can't get one foot in front of the other..  History of Present Illness: Ms. Linda Mejia is a very pleasant 55 year old woman with past medical history of coronary artery disease, stent to her LAD in 1999 with occluded RCA, chest pain leading to cardiac catheter on June 6 with 2.75 x 18 mm promus stent placed to her LAD prior to the previous stent also with 30% left circumflex disease and known occluded RCA also noted to have moderate to severe aortic disease, history of DJD with radiating neck arm pain On pain medication, recent hospitalization for ruptured appendix requiring a long course of antibiotics As an inpatient with laparoscopic resection, with readmission for intussusception.   she had a recent hospital admission to [facility] May 31, 2010 for her chest pain. Cardiac catheterization was performed that showed stable disease with no intervention needed. She did have 30-40% proximal LAD disease, 30-40% distal in-stent stenosis in the proximal LAD  overall she has been doing well recently. she denies any significant chest pain. She has run out of several of her medications as she did not sign a form of care with part B. She came off her Cymbalta and estrogen and has not felt well. She has been unable to afford these medications.  She has a very strong family history of coronary artery disease, 3 of her siblings have died.  EKG shows normal sinus rhythm with rate 78 beats per minute, no significant ST or T wave changes. QTC 481  Current Medications (verified): 1)  Diazepam 10 Mg Tabs (Diazepam) .... Three Times A Day 2)  Aspirin 325 Mg Tabs (Aspirin) .Marland Kitchen.. 1 Qd 3)  Nitrostat 0.4 Mg Subl (Nitroglycerin) .Marland Kitchen.. 1 Tablet Under Tongue At Onset of Chest Pain; You May Repeat Every 5 Minutes For Up To 3 Doses. 4)  Plavix 75 Mg Tabs (Clopidogrel Bisulfate) ....  Take One Tablet By Mouth Daily 5)  Zolpidem Tartrate 10 Mg Tabs (Zolpidem Tartrate) .Marland Kitchen.. 1qhs 6)  Cymbalta 30 Mg Cpep (Duloxetine Hcl) .... 3 Tablets Once Daily 7)  Adderall 12.5 Mg Tabs (Amphetamine-Dextroamphetamine) .... 2 Tablets Two Times A Day 8)  Fioricet 50-325-40 Mg Tabs (Butalbital-Apap-Caffeine) .... 4 X Once Daily As Needed 9)  Flexeril 10 Mg Tabs (Cyclobenzaprine Hcl) .... 3 Tablets Once Daily 10)  Metoprolol Tartrate 50 Mg Tabs (Metoprolol Tartrate) .... One Tablet Two Times A Day 11)  Crestor 20 Mg Tabs (Rosuvastatin Calcium) .... One Tablet At Bedtime  Allergies (verified): No Known Drug Allergies  Past History:  Past Medical History: Last updated: 10/02/2009 Anxiety CAD Hypertension ADHD Chronic back pain  Past Surgical History: Last updated: 10/02/2009 C-section Tubal ligation.  Cardiac Catheterization Tonsillectomy  Family History: Last updated: 10/03/2009 Family History of Coronary Artery Disease: father Family History of Diabetes: Mother   Social History: Last updated: 10/03/2009 Widowed  Tobacco Use - Former. 1ppd x 30 yrs Alcohol Use - no Drug Use - no Regular Exercise - no  Risk Factors: Exercise: no (10/03/2009)  Risk Factors: Smoking Status: quit (10/02/2009)  Review of Systems  The patient denies fever, weight loss, weight gain, vision loss, decreased hearing, hoarseness, chest pain, syncope, dyspnea on exertion, peripheral edema, prolonged cough, abdominal pain, incontinence, muscle weakness, depression, and enlarged lymph nodes.    Vital Signs:  Patient profile:   55 year old female Height:  66 inches Weight:      164 pounds BMI:     26.57 Pulse rate:   77 / minute BP sitting:   144 / 90  (left arm) Cuff size:   regular  Vitals Entered By: Bishop Dublin, CMA (June 24, 2010 3:43 PM)  Physical Exam  General:  Well developed, well nourished, in no acute distress. Head:  normocephalic and atraumatic Neck:  Neck supple,  no JVD. No masses, thyromegaly or abnormal cervical nodes. Lungs:  Clear bilaterally to auscultation and percussion. Heart:  Non-displaced PMI, chest non-tender; regular rate and rhythm, S1, S2 without murmurs, rubs or gallops. Carotid upstroke normal, no bruit. . Pedals normal pulses. No edema, no varicosities. Abdomen:  Bowel sounds positive; abdomen soft and non-tender without masses Msk:  Back normal, normal gait. Muscle strength and tone normal. Pulses:  pulses normal in all 4 extremities Extremities:  No clubbing or cyanosis. Neurologic:  Alert and oriented x 3. Skin:  Intact without lesions or rashes. Psych:  Normal affect.   Impression & Recommendations:  Problem # 1:  CAD, NATIVE VESSEL (ICD-414.01) stable coronary artery disease. No significant symptoms of chest pain. Continued medical management.  Her updated medication list for this problem includes:    Aspirin 325 Mg Tabs (Aspirin) .Marland Kitchen... 1 qd    Nitrostat 0.4 Mg Subl (Nitroglycerin) .Marland Kitchen... 1 tablet under tongue at onset of chest pain; you may repeat every 5 minutes for up to 3 doses.    Plavix 75 Mg Tabs (Clopidogrel bisulfate) .Marland Kitchen... Take one tablet by mouth daily    Metoprolol Tartrate 50 Mg Tabs (Metoprolol tartrate) ..... One tablet two times a day  Problem # 2:  HYPERLIPIDEMIA-MIXED (ICD-272.4) Continue Crestor 20 mg daily. Goal LDL less than 70.  Her updated medication list for this problem includes:    Crestor 20 Mg Tabs (Rosuvastatin calcium) ..... One tablet at bedtime  Problem # 3:  HYPERTENSION, UNSPECIFIED (ICD-401.9) Blood pressure is borderline elevated. She is anxious today as she has run out of several of her medications. We'll continue to monitor her blood pressure.  Her updated medication list for this problem includes:    Aspirin 325 Mg Tabs (Aspirin) .Marland Kitchen... 1 qd    Metoprolol Tartrate 50 Mg Tabs (Metoprolol tartrate) ..... One tablet two times a day  Patient Instructions: 1)  Your physician  recommends that you schedule a follow-up appointment in: 6 months 2)  Your physician recommends that you continue on your current medications as directed. Please refer to the Current Medication list given to you today.

## 2010-07-12 ENCOUNTER — Ambulatory Visit: Payer: Medicare Other | Admitting: Unknown Physician Specialty

## 2010-07-16 ENCOUNTER — Ambulatory Visit: Payer: Medicare Other | Admitting: Unknown Physician Specialty

## 2010-07-18 LAB — BASIC METABOLIC PANEL
BUN: 15 mg/dL (ref 6–23)
CO2: 27 mEq/L (ref 19–32)
Calcium: 9.2 mg/dL (ref 8.4–10.5)
Chloride: 102 mEq/L (ref 96–112)
Creatinine, Ser: 0.74 mg/dL (ref 0.4–1.2)
GFR calc Af Amer: >60 mL/min (ref >60)
GFR calc non Af Amer: >60 mL/min (ref >60)
Glucose, Bld: 124 mg/dL — ABNORMAL HIGH (ref 70–99)
Potassium: 3.4 mEq/L — ABNORMAL LOW (ref 3.5–5.1)
Sodium: 139 mEq/L (ref 135–145)

## 2010-07-18 LAB — RAPID URINE DRUG SCREEN, HOSP PERFORMED
Amphetamines: NOT DETECTED
Barbiturates: NOT DETECTED
Benzodiazepines: POSITIVE — AB
Cocaine: NOT DETECTED
Opiates: NOT DETECTED
Tetrahydrocannabinol: NOT DETECTED

## 2010-07-18 LAB — ETHANOL: Alcohol, Ethyl (B): <5 mg/dL (ref 0–10)

## 2010-07-18 LAB — DIFFERENTIAL
Basophils Absolute: 0.1 10*3/uL (ref 0.0–0.1)
Basophils Relative: 1 % (ref 0–1)
Eosinophils Absolute: 0.1 10*3/uL (ref 0.0–0.7)
Eosinophils Relative: 2 % (ref 0–5)
Lymphocytes Relative: 43 % (ref 12–46)
Lymphs Abs: 3.5 10*3/uL (ref 0.7–4.0)
Monocytes Absolute: 1 10*3/uL (ref 0.1–1.0)
Monocytes Relative: 13 % — ABNORMAL HIGH (ref 3–12)
Neutro Abs: 3.3 10*3/uL (ref 1.7–7.7)
Neutrophils Relative %: 41 % — ABNORMAL LOW (ref 43–77)

## 2010-07-18 LAB — CBC
HCT: 36.7 % (ref 36.0–46.0)
Hemoglobin: 12.8 g/dL (ref 12.0–15.0)
MCHC: 34.9 g/dL (ref 30.0–36.0)
MCV: 97 fL (ref 78.0–100.0)
Platelets: 283 10*3/uL (ref 150–400)
RBC: 3.78 MIL/uL — ABNORMAL LOW (ref 3.87–5.11)
RDW: 13.5 % (ref 11.5–15.5)
WBC: 8.3 10*3/uL (ref 4.0–10.5)

## 2010-07-18 LAB — TRICYCLICS SCREEN, URINE: TCA Scrn: NOT DETECTED

## 2010-07-29 ENCOUNTER — Other Ambulatory Visit: Payer: Self-pay | Admitting: Emergency Medicine

## 2010-07-29 MED ORDER — METOPROLOL TARTRATE 50 MG PO TABS: 50.0000 mg | ORAL_TABLET | Freq: Two times a day (BID) | ORAL | Status: DC

## 2010-07-30 ENCOUNTER — Other Ambulatory Visit: Payer: Self-pay | Admitting: Emergency Medicine

## 2010-07-30 MED ORDER — METOPROLOL TARTRATE 50 MG PO TABS: 50.0000 mg | ORAL_TABLET | Freq: Two times a day (BID) | ORAL | Status: DC

## 2010-08-13 ENCOUNTER — Ambulatory Visit: Payer: Medicare Other | Admitting: Unknown Physician Specialty

## 2010-08-20 ENCOUNTER — Ambulatory Visit: Payer: Medicare Other | Admitting: Emergency Medicine

## 2010-08-27 NOTE — H&P (Signed)
NAME:  Linda Mejia, Linda Mejia                 ACCOUNT NO.:  [id number]   MEDICAL RECORD NO.:  [id number]          PATIENT TYPE:  INP   LOCATION:  3707                         FACILITY:  [facility]   PHYSICIAN:  Rollene Rotunda, MD, FACCDATE OF BIRTH:  1955-07-25   DATE OF ADMISSION:  08/31/2007  DATE OF DISCHARGE:                              HISTORY & PHYSICAL   PRIMARY CARE PHYSICIAN:  None.   CARDIOLOGIST:  None.   REASON FOR PRESENTATION:  Evaluate the patient with chest pain.   HISTORY OF PRESENT ILLNESS:  The patient is a 55 year old white female  with a past history of cardiac disease, status post stenting.  She has  had 2 days of chest discomfort and left shoulder discomfort.  This has  been off and on predominately present for these last 2 days.  It has  been severe reaching at 10/10 at times.  She has discomfort substernal.  It is heavy.  She has some radiation to her left shoulder.  She has had  twinges in her jaw.  She does not think she could bring this on though  she has not been particularly active in the last 2 days.  She has had  some associated shortness of breath and diaphoresis and mild nausea, but  no vomiting.  She apparently went to [facility] last night with this chest  discomfort.  They wanted to admit her in cath, but she refused.  The  pain persisted today, so she came to the emergency room.  There were no  acute EKG changes.  Point-of-care markers x1 have been negative.  The  pain is now relieved with nitroglycerin.  It is similar to the pain she  had with her heart attack and stent in 1999.  She says that other than  these complaints, she had been having increasing fatigue over the last 2  weeks.   PAST MEDICAL HISTORY:  Stents to a major artery a myocardial  infarction in 1999 (she reports this was done in [place] by Dr.  Juliann Pares), ADD, panic attacks, and chronic back pain.   PAST SURGICAL HISTORY:  C-section, tonsillectomy, and tubal ligation.   ALLERGIES:  None.   MEDICATIONS:  1. Adderall 30 mg b.i.d.  2. Valium.  3. Percocet.  4. Aspirin.  5. Omega-3 fatty acid.   SOCIAL HISTORY:  The patient was smoking one pack a day for 27 years.  She quit about 3 months ago though she picks up cigarettes now and again  when she is stressed.  She lives with two daughters.  She is a widow.   FAMILY HISTORY:  Strongly positive for early coronary artery disease.  She had brother died in his 47s with an MI, another sister died in her  72s with an MI, others have stents.  Another brother died in his 84s  with an myocardial infarction.  Her dad's heart exploded in the 60s.   REVIEW OF SYSTEMS:  Positive for anxiety recently.  Otherwise, negative  for all other systems.   PHYSICAL EXAMINATION:  The patient is somewhat anxious, but in no  distress.  Blood pressure 134/78, heart 86 and regular, respiratory rate 18, and  afebrile.  HEENT:  Eyes are unremarkable.  Pupils are equal, round, and reactive to  light.  Fundi not visualized.  Oral mucosa unremarkable.  NECK:  No jugular distention 45 degrees, carotid upstroke brisk and  symmetrical.  No bruits or thyromegaly.  LYMPHATICS:  No cervical, axillary, or inguinal adenopathy.  LUNGS:  Clear to auscultation bilaterally.  BACK:  No costovertebral angle tenderness.  CHEST:  Unremarkable.  HEART:  PMI not displaced or sustained, S1 and S2 within normal limits.  No S3 and S4.  No clicks, rubs, or murmurs.  ABDOMEN:  Flat, positive  bowel sounds, normal frequency and pitch.  No bruits, rebound, or  guarding.  No midline pulsatile mass, hepatomegaly, or splenomegaly.  SKIN:  No rashes, no nodules.  EXTREMITIES:  2+ pulses throughout.  No edema, no cyanosis, or clubbing.  NEURO:  Oriented to person, place, and time.  Cranial nerves II through  XII are grossly intact, motor grossly intact.   EKG sinus rhythm, rate 80, axis within normal limits, intervals within  normal.  Acute ST-wave change.   Chest x-ray (per  preliminary ER report), no acute disease.   LABORATORY DATA:  WBC 7.3, hemoglobin 12.2, and platelets 333.  Sodium  140, potassium 3.9, BUN 70, and creatinine 0.8.   ASSESSMENT/PLAN:  1. Chest.  The patient's chest discomfort is very suspicious for      unstable angina.  She has known coronary artery disease.  She has      ongoing risk factors.  The pretest probability of obstructive      coronary artery disease is very high.  Given this, we will suggest      cardiac catheterization is the correct diagnostic test.  The      patient understands the risks and benefits.  I described this to      her daughter in detail.  The patient did not want to listen.  She      again has been through the procedures and has had no problems with      it and would accept the risks.  We will treat her with heparin, IV      nitroglycerin, beta-blockers, and aspirin.  We will rule out      myocardial infarction.  2. Hypertension.  We will treat this with beta-blockers.  3. Risk reduction.  We will check lipids.  4. Tobacco.  She will be counseled about the need to stop smoking      completely.  5. Back pain.  She will continue with p.r.n. Percocet.  6. Attention deficit disorder.  She will continue with Adderall.      Rollene Rotunda, MD, [facility]  Electronically Signed     JH/MEDQ  D:  08/31/2007  T:  08/31/2007  Job:  [id number]   cc:   Vesta Mixer, M.D.

## 2010-08-27 NOTE — Cardiovascular Report (Signed)
NAME:  [name], [name]                 ACCOUNT NO.:  [id number]   MEDICAL RECORD NO.:  [id number]          PATIENT TYPE:  INP   LOCATION:  3707                         FACILITY:  [facility]   PHYSICIAN:  Vesta Mixer, M.D. DATE OF BIRTH:  Feb 11, 1956   DATE OF PROCEDURE:  08/31/2007  DATE OF DISCHARGE:                            CARDIAC CATHETERIZATION   Ms. Linda Mejia is a 55 year old female with a history of coronary artery  disease.  She presented last night with 3-day history of chest pain.  Today, she is referred for heart catheterization for further evaluation.   The patient has a history of coronary artery disease and has had a stent  placed in her LAD by Dr. __________ back in 1999.  She presents with 3  days of chest pain.  She was originally admitted to [facility] in [place], [place].  She had some sort of  discrimination with a doctor, and so she left there and presented to  [facility] last night around 1:30.  She is referred for heart  catheterization for further evaluation.   PROCEDURE:  Left heart catheterization with coronary angiography.   The right femoral artery was easily cannulated using modified Seldinger  technique.   HEMODYNAMICS:  LV pressure is 115/3.  Aortic pressure is 120/70.   Angiography left main, the left main is fairly normal.   The left anterior descending artery has minor luminal irregularities.  There is a stent the proximal LAD which is widely patent.  The remaining  LAD is unremarkable.  The first diagonal artery is a moderate-sized  vessel and is normal.   The the left circumflex artery has mild irregularities.  It is a fairly  large system.  The first obtuse marginal artery is a moderate-to-large  vessel.  There is an aneurysmal sac at approximately 3-4 mm in diameter  that comes off the proximal aspect of the first OM.  The remaining  circumflex artery is normal.   The right coronary artery is occluded proximally.  It  fills through an  extensive collateral network left with left-to-right collaterals.   The left ventriculogram was performed in a 30 RAO position.  It reveals  mild hypokinesis of the apex.  The overall ejection fraction is at the  lower limits of normal is probably 50%.  There is no significant mitral  regurgitation.   COMPLICATIONS:  None.   CONCLUSION:  Stable coronary artery disease.  The patient has a patent  stent in her LAD.  She has an occluded right coronary artery, but has  good collateral filling from via left-to-right collaterals.  We will  continue with medical therapy.           ______________________________  Vesta Mixer, M.D.     PJN/MEDQ  D:  08/31/2007  T:  09/01/2007  Job:  [id number]

## 2010-08-27 NOTE — Discharge Summary (Signed)
NAME:  Linda Mejia, Linda Mejia                 ACCOUNT NO.:  [id number]   MEDICAL RECORD NO.:  [id number]          PATIENT TYPE:  INP   LOCATION:  3707                         FACILITY:  [facility]   PHYSICIAN:  Vesta Mixer, M.D. DATE OF BIRTH:  [birth year]   DATE OF ADMISSION:  08/31/2007  DATE OF DISCHARGE:  09/01/2007                               DISCHARGE SUMMARY   DISCHARGE DIAGNOSES:  1. History of coronary artery disease - status post percutaneous      transluminal coronary angioplasty and stenting of her left anterior      descending artery in 1999.  She also has a chronically occluded      right coronary artery with good left-to-right collateral flow.  2. Noncardiac chest pain, this admission.  3. Anxiety and depression.   DISCHARGE MEDICATIONS:  1. Enteric-coated aspirin 81 mg a day.  2. Nitroglycerin as needed.  3. Metoprolol 25 mg twice a day.  4. Adderall 30 mg a day or as otherwise directed by her primary      medical doctor.  5. Percocet tabs as directed by her primary medical doctor.   DISPOSITION:  The patient will see Dr. Elease Hashimoto in 1 month.  She is to see  her primary medical doctor for further problems.   HISTORY:  [name] is a 55 year old female with a history of coronary  artery disease.  She was admitted to the hospital as an unattached  patient with episodes of chest pain.  Please see dictated H&P for  further details.   HOSPITAL COURSE:  Chest pain.  The patient is ruled out for myocardial  infarction.  Her EKG was entirely normal.  Because of her symptoms and  her history, we proceeded with heart catheterization.  She was found to  have a normal left main.  Her left anterior descending artery was widely  patent with a patent mid LAD stent.  Her circumflex artery was  unremarkable.  Her right coronary artery was chronically occluded and  had good left-to-right collateral filling.  She had normal left  ventricular systolic function.   She is now discharged in  satisfactory condition.  She stated that she  will need to get a new medical doctor.  We will have the social worker  talk to her about that.  She lives now in [place] so I am not sure  if she is qualified to the resources here in [place].  I will  see her in a month.           ______________________________  Vesta Mixer, M.D.     PJN/MEDQ  D:  09/01/2007  T:  09/01/2007  Job:  [id number]

## 2010-09-13 ENCOUNTER — Ambulatory Visit: Payer: Medicare Other | Admitting: Unknown Physician Specialty

## 2011-01-08 LAB — BASIC METABOLIC PANEL
BUN: 10
BUN: 9
CO2: 28
CO2: 30
Calcium: 8.5
Calcium: 8.8
Chloride: 102
Chloride: 105
Creatinine, Ser: 0.65
Creatinine, Ser: 0.67
GFR calc Af Amer: >60
GFR calc Af Amer: >60
GFR calc non Af Amer: >60
GFR calc non Af Amer: >60
Glucose, Bld: 101 — ABNORMAL HIGH
Glucose, Bld: 97
Potassium: 3.5
Potassium: 3.6
Sodium: 139
Sodium: 142

## 2011-01-08 LAB — PROTIME-INR
INR: 0.8
Prothrombin Time: 11.6

## 2011-01-08 LAB — D-DIMER, QUANTITATIVE

## 2011-01-08 LAB — POCT I-STAT, CHEM 8
BUN: 17
Calcium, Ion: 1.22
Chloride: 102
Creatinine, Ser: 0.8
Glucose, Bld: 89
HCT: 38
Hemoglobin: 12.9
Potassium: 3.9
Sodium: 140
TCO2: 30

## 2011-01-08 LAB — CBC
HCT: 35.8 — ABNORMAL LOW
HCT: 36.4
HCT: 37.4
Hemoglobin: 12.2
Hemoglobin: 12.4
Hemoglobin: 12.6
MCHC: 33.8
MCHC: 34
MCHC: 34
MCV: 94
MCV: 94.7
MCV: 95.2
Platelets: 305
Platelets: 328
Platelets: 333
RBC: 3.78 — ABNORMAL LOW
RBC: 3.87
RBC: 3.93
RDW: 13.3
RDW: 13.4
RDW: 13.9
WBC: 5.9
WBC: 7.2
WBC: 7.3

## 2011-01-08 LAB — DIFFERENTIAL
Basophils Absolute: 0.1
Basophils Relative: 1
Eosinophils Absolute: 0.2
Eosinophils Relative: 3
Lymphocytes Relative: 46
Lymphs Abs: 3.3
Monocytes Absolute: 0.7
Monocytes Relative: 10
Neutro Abs: 3
Neutrophils Relative %: 41 — ABNORMAL LOW

## 2011-01-08 LAB — HEPARIN LEVEL (UNFRACTIONATED)
Heparin Unfractionated: 0.31
Heparin Unfractionated: <0.1 — ABNORMAL LOW
Heparin Unfractionated: <0.1 — ABNORMAL LOW

## 2011-01-08 LAB — LIPID PANEL
Cholesterol: 188
HDL: 57
LDL Cholesterol: 110 — ABNORMAL HIGH
Total CHOL/HDL Ratio: 3.3
Triglycerides: 107
VLDL: 21

## 2011-01-08 LAB — APTT: aPTT: 25

## 2011-01-08 LAB — CARDIAC PANEL(CRET KIN+CKTOT+MB+TROPI)
CK, MB: 0.7
CK, MB: 1
Relative Index: INVALID
Relative Index: INVALID
Total CK: 64
Total CK: 81
Troponin I: 0.01
Troponin I: 0.01

## 2011-01-08 LAB — POCT CARDIAC MARKERS
CKMB, poc: <1 — ABNORMAL LOW
CKMB, poc: <1 — ABNORMAL LOW
Myoglobin, poc: 10.4 — ABNORMAL LOW
Myoglobin, poc: 13.1
Operator id: 257131
Operator id: 257131
Troponin i, poc: <0.05
Troponin i, poc: <0.05

## 2011-01-08 LAB — TSH: TSH: 1.302

## 2011-01-08 LAB — TROPONIN I

## 2011-01-08 LAB — CK TOTAL AND CKMB (NOT AT ARMC)
CK, MB: 1
Relative Index: INVALID
Total CK: 94

## 2011-06-26 ENCOUNTER — Telehealth: Payer: Self-pay

## 2011-06-26 MED ORDER — METOPROLOL TARTRATE 50 MG PO TABS: 50.0000 mg | ORAL_TABLET | Freq: Two times a day (BID) | ORAL | Status: DC

## 2011-06-26 NOTE — Telephone Encounter (Signed)
Refill sent for metoprolol.  

## 2011-07-29 ENCOUNTER — Ambulatory Visit: Payer: Medicare Other | Admitting: Cardiovascular Disease

## 2011-07-31 ENCOUNTER — Encounter: Payer: Self-pay | Admitting: Cardiovascular Disease

## 2011-10-26 ENCOUNTER — Emergency Department: Payer: Self-pay | Admitting: *Deleted

## 2011-10-26 LAB — BASIC METABOLIC PANEL
Anion Gap: 12 (ref 7–16)
BUN: 15 mg/dL (ref 7–18)
Calcium, Total: 8.2 mg/dL — ABNORMAL LOW (ref 8.5–10.1)
Chloride: 106 mmol/L (ref 98–107)
Co2: 23 mmol/L (ref 21–32)
Creatinine: 0.88 mg/dL (ref 0.60–1.30)
EGFR (African American): >60
EGFR (Non-African Amer.): >60
Glucose: 97 mg/dL (ref 65–99)
Osmolality: 282 (ref 275–301)
Potassium: 3.5 mmol/L (ref 3.5–5.1)
Sodium: 141 mmol/L (ref 136–145)

## 2011-10-26 LAB — CBC
HCT: 37.1 % (ref 35.0–47.0)
HGB: 12.2 g/dL (ref 12.0–16.0)
MCH: 31.8 pg (ref 26.0–34.0)
MCHC: 32.9 g/dL (ref 32.0–36.0)
MCV: 97 fL (ref 80–100)
Platelet: 317 10*3/uL (ref 150–440)
RBC: 3.84 10*6/uL (ref 3.80–5.20)
RDW: 13.2 % (ref 11.5–14.5)
WBC: 6.4 10*3/uL (ref 3.6–11.0)

## 2011-10-26 LAB — TROPONIN I: Troponin-I: <0.02 ng/mL

## 2011-10-26 LAB — CK TOTAL AND CKMB (NOT AT ARMC)
CK, Total: 145 U/L (ref 21–215)
CK-MB: 1.7 ng/mL (ref 0.5–3.6)

## 2011-10-27 LAB — TROPONIN I: Troponin-I: <0.02 ng/mL

## 2011-10-27 LAB — CK TOTAL AND CKMB (NOT AT ARMC)
CK, Total: 129 U/L (ref 21–215)
CK-MB: 1.4 ng/mL (ref 0.5–3.6)

## 2012-01-07 ENCOUNTER — Ambulatory Visit: Payer: Self-pay

## 2012-01-07 LAB — BASIC METABOLIC PANEL
Anion Gap: 6 — ABNORMAL LOW (ref 7–16)
BUN: 18 mg/dL (ref 7–18)
Calcium, Total: 8.4 mg/dL — ABNORMAL LOW (ref 8.5–10.1)
Chloride: 104 mmol/L (ref 98–107)
Co2: 31 mmol/L (ref 21–32)
Creatinine: 0.92 mg/dL (ref 0.60–1.30)
EGFR (African American): >60
EGFR (Non-African Amer.): >60
Glucose: 91 mg/dL (ref 65–99)
Osmolality: 283 (ref 275–301)
Potassium: 4.2 mmol/L (ref 3.5–5.1)
Sodium: 141 mmol/L (ref 136–145)

## 2012-01-07 LAB — CBC
HCT: 37.6 % (ref 35.0–47.0)
HGB: 13 g/dL (ref 12.0–16.0)
MCH: 33.7 pg (ref 26.0–34.0)
MCHC: 34.7 g/dL (ref 32.0–36.0)
MCV: 97 fL (ref 80–100)
Platelet: 324 10*3/uL (ref 150–440)
RBC: 3.87 10*6/uL (ref 3.80–5.20)
RDW: 13 % (ref 11.5–14.5)
WBC: 6.4 10*3/uL (ref 3.6–11.0)

## 2012-01-13 ENCOUNTER — Ambulatory Visit: Payer: Self-pay

## 2012-01-15 LAB — PATHOLOGY REPORT

## 2012-01-30 ENCOUNTER — Other Ambulatory Visit: Payer: Self-pay

## 2012-01-30 MED ORDER — METOPROLOL TARTRATE 50 MG PO TABS: 50.0000 mg | ORAL_TABLET | Freq: Two times a day (BID) | ORAL | Status: DC

## 2012-01-30 NOTE — Telephone Encounter (Signed)
Patient is aware to contact our office to schedule a follow up before we refill her medication again.

## 2012-06-14 ENCOUNTER — Other Ambulatory Visit: Payer: Self-pay

## 2012-06-14 MED ORDER — METOPROLOL TARTRATE 50 MG PO TABS: 50.0000 mg | ORAL_TABLET | Freq: Two times a day (BID) | ORAL | Status: DC

## 2012-06-14 NOTE — Telephone Encounter (Signed)
Refill sent for metoprolol.  

## 2012-06-30 ENCOUNTER — Emergency Department: Payer: Self-pay | Admitting: Emergency Medicine

## 2012-06-30 LAB — BASIC METABOLIC PANEL
Anion Gap: 8 (ref 7–16)
BUN: 12 mg/dL (ref 7–18)
Calcium, Total: 8.3 mg/dL — ABNORMAL LOW (ref 8.5–10.1)
Chloride: 104 mmol/L (ref 98–107)
Co2: 26 mmol/L (ref 21–32)
Creatinine: 0.72 mg/dL (ref 0.60–1.30)
EGFR (African American): >60
EGFR (Non-African Amer.): >60
Glucose: 93 mg/dL (ref 65–99)
Osmolality: 275 (ref 275–301)
Potassium: 4.1 mmol/L (ref 3.5–5.1)
Sodium: 138 mmol/L (ref 136–145)

## 2012-06-30 LAB — CBC
HCT: 38.9 % (ref 35.0–47.0)
HGB: 12.6 g/dL (ref 12.0–16.0)
MCH: 31.4 pg (ref 26.0–34.0)
MCHC: 32.2 g/dL (ref 32.0–36.0)
MCV: 97 fL (ref 80–100)
Platelet: 333 10*3/uL (ref 150–440)
RBC: 4 10*6/uL (ref 3.80–5.20)
RDW: 13.6 % (ref 11.5–14.5)
WBC: 5.6 10*3/uL (ref 3.6–11.0)

## 2012-06-30 LAB — PRO B NATRIURETIC PEPTIDE: B-Type Natriuretic Peptide: 254 pg/mL — ABNORMAL HIGH (ref 0–125)

## 2012-06-30 LAB — TROPONIN I: Troponin-I: <0.02 ng/mL

## 2012-07-07 ENCOUNTER — Ambulatory Visit: Payer: Medicare Other | Admitting: Cardiovascular Disease

## 2012-07-08 ENCOUNTER — Encounter: Payer: Self-pay | Admitting: *Deleted

## 2012-11-04 ENCOUNTER — Emergency Department: Payer: Self-pay | Admitting: Emergency Medicine

## 2012-11-12 ENCOUNTER — Emergency Department: Payer: Self-pay | Admitting: Internal Medicine

## 2012-12-05 ENCOUNTER — Emergency Department: Payer: Self-pay | Admitting: Emergency Medicine

## 2012-12-11 ENCOUNTER — Emergency Department: Payer: Self-pay | Admitting: Emergency Medicine

## 2012-12-11 LAB — CK TOTAL AND CKMB (NOT AT ARMC)
CK, Total: 125 U/L (ref 21–215)
CK-MB: 0.8 ng/mL (ref 0.5–3.6)

## 2012-12-11 LAB — COMPREHENSIVE METABOLIC PANEL
Albumin: 3.6 g/dL (ref 3.4–5.0)
Alkaline Phosphatase: 118 U/L (ref 50–136)
Anion Gap: 5 — ABNORMAL LOW (ref 7–16)
BUN: 9 mg/dL (ref 7–18)
Bilirubin,Total: 0.4 mg/dL (ref 0.2–1.0)
Calcium, Total: 9 mg/dL (ref 8.5–10.1)
Chloride: 103 mmol/L (ref 98–107)
Co2: 30 mmol/L (ref 21–32)
Creatinine: 0.62 mg/dL (ref 0.60–1.30)
EGFR (African American): >60
EGFR (Non-African Amer.): >60
Glucose: 76 mg/dL (ref 65–99)
Osmolality: 273 (ref 275–301)
Potassium: 4.2 mmol/L (ref 3.5–5.1)
SGOT(AST): 32 U/L (ref 15–37)
SGPT (ALT): 19 U/L (ref 12–78)
Sodium: 138 mmol/L (ref 136–145)
Total Protein: 7.3 g/dL (ref 6.4–8.2)

## 2012-12-11 LAB — CBC
HCT: 38.6 % (ref 35.0–47.0)
HGB: 13.3 g/dL (ref 12.0–16.0)
MCH: 32.2 pg (ref 26.0–34.0)
MCHC: 34.5 g/dL (ref 32.0–36.0)
MCV: 93 fL (ref 80–100)
Platelet: 318 10*3/uL (ref 150–440)
RBC: 4.14 10*6/uL (ref 3.80–5.20)
RDW: 13.3 % (ref 11.5–14.5)
WBC: 7.2 10*3/uL (ref 3.6–11.0)

## 2012-12-11 LAB — TROPONIN I: Troponin-I: <0.02 ng/mL

## 2013-01-29 ENCOUNTER — Inpatient Hospital Stay: Payer: Self-pay | Admitting: Internal Medicine

## 2013-01-29 LAB — URINALYSIS, COMPLETE
Bilirubin,UR: NEGATIVE
Glucose,UR: NEGATIVE mg/dL (ref 0–75)
Ketone: NEGATIVE
Nitrite: NEGATIVE
Ph: 5 (ref 4.5–8.0)
Protein: NEGATIVE
RBC,UR: 4 /HPF (ref 0–5)
Specific Gravity: 1.006 (ref 1.003–1.030)
Squamous Epithelial: 9
WBC UR: 9 /HPF (ref 0–5)

## 2013-01-29 LAB — COMPREHENSIVE METABOLIC PANEL
Albumin: 3.3 g/dL — ABNORMAL LOW (ref 3.4–5.0)
Alkaline Phosphatase: 83 U/L (ref 50–136)
Anion Gap: 8 (ref 7–16)
BUN: 10 mg/dL (ref 7–18)
Bilirubin,Total: 0.4 mg/dL (ref 0.2–1.0)
Calcium, Total: 8.3 mg/dL — ABNORMAL LOW (ref 8.5–10.1)
Chloride: 102 mmol/L (ref 98–107)
Co2: 26 mmol/L (ref 21–32)
Creatinine: 0.83 mg/dL (ref 0.60–1.30)
EGFR (African American): >60
EGFR (Non-African Amer.): >60
Glucose: 166 mg/dL — ABNORMAL HIGH (ref 65–99)
Osmolality: 275 (ref 275–301)
Potassium: 3.3 mmol/L — ABNORMAL LOW (ref 3.5–5.1)
SGOT(AST): 28 U/L (ref 15–37)
SGPT (ALT): 20 U/L (ref 12–78)
Sodium: 136 mmol/L (ref 136–145)
Total Protein: 6.7 g/dL (ref 6.4–8.2)

## 2013-01-29 LAB — MAGNESIUM: Magnesium: 1.2 mg/dL — ABNORMAL LOW

## 2013-01-29 LAB — CBC WITH DIFFERENTIAL/PLATELET
Basophil #: 0.1 10*3/uL (ref 0.0–0.1)
Basophil %: 0.4 %
Eosinophil #: 0 10*3/uL (ref 0.0–0.7)
Eosinophil %: 0.1 %
HCT: 37.9 % (ref 35.0–47.0)
HGB: 12.7 g/dL (ref 12.0–16.0)
Lymphocyte #: 1 10*3/uL (ref 1.0–3.6)
Lymphocyte %: 7.1 %
MCH: 31.6 pg (ref 26.0–34.0)
MCHC: 33.5 g/dL (ref 32.0–36.0)
MCV: 94 fL (ref 80–100)
Monocyte #: 1 x10 3/mm — ABNORMAL HIGH (ref 0.2–0.9)
Monocyte %: 7.2 %
Neutrophil #: 11.8 10*3/uL — ABNORMAL HIGH (ref 1.4–6.5)
Neutrophil %: 85.2 %
Platelet: 294 10*3/uL (ref 150–440)
RBC: 4.02 10*6/uL (ref 3.80–5.20)
RDW: 14.2 % (ref 11.5–14.5)
WBC: 13.9 10*3/uL — ABNORMAL HIGH (ref 3.6–11.0)

## 2013-01-29 LAB — TROPONIN I: Troponin-I: <0.02 ng/mL

## 2013-01-29 LAB — PHOSPHORUS: Phosphorus: 2.4 mg/dL — ABNORMAL LOW (ref 2.5–4.9)

## 2013-01-30 LAB — BASIC METABOLIC PANEL
Anion Gap: 3 — ABNORMAL LOW (ref 7–16)
BUN: 7 mg/dL (ref 7–18)
Calcium, Total: 8.3 mg/dL — ABNORMAL LOW (ref 8.5–10.1)
Chloride: 108 mmol/L — ABNORMAL HIGH (ref 98–107)
Co2: 28 mmol/L (ref 21–32)
Creatinine: 0.76 mg/dL (ref 0.60–1.30)
EGFR (African American): >60
EGFR (Non-African Amer.): >60
Glucose: 139 mg/dL — ABNORMAL HIGH (ref 65–99)
Osmolality: 278 (ref 275–301)
Potassium: 3.8 mmol/L (ref 3.5–5.1)
Sodium: 139 mmol/L (ref 136–145)

## 2013-01-30 LAB — RAPID INFLUENZA A&B ANTIGENS

## 2013-01-30 LAB — CBC WITH DIFFERENTIAL/PLATELET
Basophil #: 0 10*3/uL (ref 0.0–0.1)
Basophil %: 0 %
Eosinophil #: 0 10*3/uL (ref 0.0–0.7)
Eosinophil %: 0 %
HCT: 36.1 % (ref 35.0–47.0)
HGB: 12.1 g/dL (ref 12.0–16.0)
Lymphocyte #: 0.7 10*3/uL — ABNORMAL LOW (ref 1.0–3.6)
Lymphocyte %: 4.6 %
MCH: 32 pg (ref 26.0–34.0)
MCHC: 33.6 g/dL (ref 32.0–36.0)
MCV: 95 fL (ref 80–100)
Monocyte #: 0.2 x10 3/mm (ref 0.2–0.9)
Monocyte %: 1.4 %
Neutrophil #: 14.5 10*3/uL — ABNORMAL HIGH (ref 1.4–6.5)
Neutrophil %: 94 %
Platelet: 288 10*3/uL (ref 150–440)
RBC: 3.79 10*6/uL — ABNORMAL LOW (ref 3.80–5.20)
RDW: 14.3 % (ref 11.5–14.5)
WBC: 15.4 10*3/uL — ABNORMAL HIGH (ref 3.6–11.0)

## 2013-01-30 LAB — DRUG SCREEN, URINE
Amphetamines, Ur Screen: POSITIVE (ref ?–1000)
Barbiturates, Ur Screen: NEGATIVE (ref ?–200)
Benzodiazepine, Ur Scrn: POSITIVE (ref ?–200)
Cannabinoid 50 Ng, Ur ~~LOC~~: NEGATIVE (ref ?–50)
Cocaine Metabolite,Ur ~~LOC~~: NEGATIVE (ref ?–300)
MDMA (Ecstasy)Ur Screen: NEGATIVE (ref ?–500)
Methadone, Ur Screen: NEGATIVE (ref ?–300)
Opiate, Ur Screen: POSITIVE (ref ?–300)
Phencyclidine (PCP) Ur S: NEGATIVE (ref ?–25)
Tricyclic, Ur Screen: NEGATIVE (ref ?–1000)

## 2013-01-30 LAB — TSH: Thyroid Stimulating Horm: 0.472 u[IU]/mL

## 2013-01-30 LAB — HEMOGLOBIN A1C: Hemoglobin A1C: 5.5 % (ref 4.2–6.3)

## 2013-01-30 LAB — MAGNESIUM: Magnesium: 2.5 mg/dL — ABNORMAL HIGH

## 2013-01-30 LAB — TROPONIN I: Troponin-I: <0.02 ng/mL

## 2013-01-31 DIAGNOSIS — I059 Rheumatic mitral valve disease, unspecified: Secondary | ICD-10-CM

## 2013-01-31 LAB — PRO B NATRIURETIC PEPTIDE: B-Type Natriuretic Peptide: 2385 pg/mL — ABNORMAL HIGH (ref 0–125)

## 2013-02-01 LAB — BASIC METABOLIC PANEL
Anion Gap: 4 — ABNORMAL LOW (ref 7–16)
BUN: 12 mg/dL (ref 7–18)
Calcium, Total: 8.3 mg/dL — ABNORMAL LOW (ref 8.5–10.1)
Chloride: 106 mmol/L (ref 98–107)
Co2: 30 mmol/L (ref 21–32)
Creatinine: 0.72 mg/dL (ref 0.60–1.30)
EGFR (African American): >60
EGFR (Non-African Amer.): >60
Glucose: 87 mg/dL (ref 65–99)
Osmolality: 279 (ref 275–301)
Potassium: 4 mmol/L (ref 3.5–5.1)
Sodium: 140 mmol/L (ref 136–145)

## 2013-02-03 LAB — CULTURE, BLOOD (SINGLE)

## 2013-02-04 ENCOUNTER — Emergency Department: Payer: Self-pay | Admitting: Emergency Medicine

## 2013-04-11 ENCOUNTER — Emergency Department: Payer: Self-pay | Admitting: Emergency Medicine

## 2013-04-11 LAB — BASIC METABOLIC PANEL
Anion Gap: 7 (ref 7–16)
BUN: 13 mg/dL (ref 7–18)
Calcium, Total: 8.8 mg/dL (ref 8.5–10.1)
Chloride: 102 mmol/L (ref 98–107)
Co2: 27 mmol/L (ref 21–32)
Creatinine: 0.69 mg/dL (ref 0.60–1.30)
EGFR (African American): >60
EGFR (Non-African Amer.): >60
Glucose: 106 mg/dL — ABNORMAL HIGH (ref 65–99)
Osmolality: 272 (ref 275–301)
Potassium: 3.6 mmol/L (ref 3.5–5.1)
Sodium: 136 mmol/L (ref 136–145)

## 2013-04-11 LAB — CBC
HCT: 42.3 % (ref 35.0–47.0)
HGB: 14.2 g/dL (ref 12.0–16.0)
MCH: 32 pg (ref 26.0–34.0)
MCHC: 33.6 g/dL (ref 32.0–36.0)
MCV: 95 fL (ref 80–100)
Platelet: 325 10*3/uL (ref 150–440)
RBC: 4.44 10*6/uL (ref 3.80–5.20)
RDW: 14.6 % — ABNORMAL HIGH (ref 11.5–14.5)
WBC: 7.2 10*3/uL (ref 3.6–11.0)

## 2013-04-11 LAB — TROPONIN I: Troponin-I: <0.02 ng/mL

## 2013-09-12 ENCOUNTER — Emergency Department: Payer: Self-pay | Admitting: Emergency Medicine

## 2014-02-10 ENCOUNTER — Emergency Department: Payer: Self-pay | Admitting: Emergency Medicine

## 2014-05-18 ENCOUNTER — Observation Stay: Payer: Self-pay | Admitting: Internal Medicine

## 2014-05-18 LAB — URINALYSIS, COMPLETE
Bacteria: NONE SEEN
Bilirubin,UR: NEGATIVE
Blood: NEGATIVE
Glucose,UR: NEGATIVE mg/dL (ref 0–75)
Ketone: NEGATIVE
Nitrite: NEGATIVE
Ph: 5 (ref 4.5–8.0)
Protein: NEGATIVE
RBC,UR: 2 /HPF (ref 0–5)
Specific Gravity: 1.017 (ref 1.003–1.030)
Squamous Epithelial: 2
WBC UR: 9 /HPF (ref 0–5)

## 2014-05-18 LAB — CBC WITH DIFFERENTIAL/PLATELET
Basophil #: 0.1 10*3/uL (ref 0.0–0.1)
Basophil %: 0.8 %
Eosinophil #: 0.2 10*3/uL (ref 0.0–0.7)
Eosinophil %: 2.2 %
HCT: 42.6 % (ref 35.0–47.0)
HGB: 14.5 g/dL (ref 12.0–16.0)
Lymphocyte #: 2.7 10*3/uL (ref 1.0–3.6)
Lymphocyte %: 33.5 %
MCH: 32.1 pg (ref 26.0–34.0)
MCHC: 33.9 g/dL (ref 32.0–36.0)
MCV: 95 fL (ref 80–100)
Monocyte #: 1 x10 3/mm — ABNORMAL HIGH (ref 0.2–0.9)
Monocyte %: 11.6 %
Neutrophil #: 4.3 10*3/uL (ref 1.4–6.5)
Neutrophil %: 51.9 %
Platelet: 355 10*3/uL (ref 150–440)
RBC: 4.51 10*6/uL (ref 3.80–5.20)
RDW: 13.9 % (ref 11.5–14.5)
WBC: 8.2 10*3/uL (ref 3.6–11.0)

## 2014-05-18 LAB — CK TOTAL AND CKMB (NOT AT ARMC)
CK, Total: 133 U/L (ref 26–192)
CK-MB: 1.4 ng/mL (ref 0.5–3.6)

## 2014-05-18 LAB — CK-MB
CK-MB: 1.4 ng/mL (ref 0.5–3.6)
CK-MB: 1 ng/mL (ref 0.5–3.6)

## 2014-05-18 LAB — COMPREHENSIVE METABOLIC PANEL
Albumin: 3.8 g/dL (ref 3.4–5.0)
Alkaline Phosphatase: 73 U/L (ref 46–116)
Anion Gap: 8 (ref 7–16)
BUN: 8 mg/dL (ref 7–18)
Bilirubin,Total: 0.2 mg/dL (ref 0.2–1.0)
Calcium, Total: 8.5 mg/dL (ref 8.5–10.1)
Chloride: 103 mmol/L (ref 98–107)
Co2: 29 mmol/L (ref 21–32)
Creatinine: 0.75 mg/dL (ref 0.60–1.30)
EGFR (African American): >60
EGFR (Non-African Amer.): >60
Glucose: 99 mg/dL (ref 65–99)
Osmolality: 278 (ref 275–301)
Potassium: 3.7 mmol/L (ref 3.5–5.1)
SGOT(AST): 29 U/L (ref 15–37)
SGPT (ALT): 18 U/L (ref 14–63)
Sodium: 140 mmol/L (ref 136–145)
Total Protein: 7.8 g/dL (ref 6.4–8.2)

## 2014-05-18 LAB — PROTIME-INR
INR: 0.8
Prothrombin Time: 11.7 secs

## 2014-05-18 LAB — TROPONIN I
Troponin-I: <0.02 ng/mL
Troponin-I: <0.02 ng/mL
Troponin-I: <0.02 ng/mL

## 2014-06-20 ENCOUNTER — Ambulatory Visit: Payer: Self-pay | Admitting: Cardiovascular Disease

## 2014-07-11 ENCOUNTER — Observation Stay
Admit: 2014-07-11 | Disposition: A | Discharge status: Home / Self Care | Payer: Self-pay | Attending: Internal Medicine | Admitting: Internal Medicine

## 2014-07-11 LAB — COMPREHENSIVE METABOLIC PANEL
Albumin: 4.6 g/dL
Alkaline Phosphatase: 68 U/L
Anion Gap: 10 (ref 7–16)
BUN: 16 mg/dL
Bilirubin,Total: 0.3 mg/dL
Calcium, Total: 9.1 mg/dL
Chloride: 102 mmol/L
Co2: 27 mmol/L
Creatinine: 0.78 mg/dL
EGFR (African American): >60
EGFR (Non-African Amer.): >60
Glucose: 96 mg/dL
Potassium: 4 mmol/L
SGOT(AST): 24 U/L
SGPT (ALT): 16 U/L
Sodium: 139 mmol/L
Total Protein: 7.9 g/dL

## 2014-07-11 LAB — CBC WITH DIFFERENTIAL/PLATELET
Basophil #: 0.1 10*3/uL (ref 0.0–0.1)
Basophil %: 0.9 %
Eosinophil #: 0.2 10*3/uL (ref 0.0–0.7)
Eosinophil %: 2.6 %
HCT: 43.8 % (ref 35.0–47.0)
HGB: 14.1 g/dL (ref 12.0–16.0)
Lymphocyte #: 2.2 10*3/uL (ref 1.0–3.6)
Lymphocyte %: 32.8 %
MCH: 30.9 pg (ref 26.0–34.0)
MCHC: 32.2 g/dL (ref 32.0–36.0)
MCV: 96 fL (ref 80–100)
Monocyte #: 0.7 x10 3/mm (ref 0.2–0.9)
Monocyte %: 10.9 %
Neutrophil #: 3.6 10*3/uL (ref 1.4–6.5)
Neutrophil %: 52.8 %
Platelet: 362 10*3/uL (ref 150–440)
RBC: 4.57 10*6/uL (ref 3.80–5.20)
RDW: 13.8 % (ref 11.5–14.5)
WBC: 6.7 10*3/uL (ref 3.6–11.0)

## 2014-07-11 LAB — HEMOGLOBIN: HGB: 12.6 g/dL (ref 12.0–16.0)

## 2014-07-12 LAB — BASIC METABOLIC PANEL
Anion Gap: 2 — ABNORMAL LOW (ref 7–16)
BUN: 10 mg/dL
Calcium, Total: 8.2 mg/dL — ABNORMAL LOW
Chloride: 111 mmol/L
Co2: 27 mmol/L
Creatinine: 0.58 mg/dL
EGFR (African American): >60
EGFR (Non-African Amer.): >60
Glucose: 99 mg/dL
Potassium: 3.8 mmol/L
Sodium: 140 mmol/L

## 2014-07-12 LAB — CBC WITH DIFFERENTIAL/PLATELET
Basophil #: 0.1 10*3/uL (ref 0.0–0.1)
Basophil %: 1 %
Eosinophil #: 0.2 10*3/uL (ref 0.0–0.7)
Eosinophil %: 3.4 %
HCT: 37.4 % (ref 35.0–47.0)
HGB: 12.3 g/dL (ref 12.0–16.0)
Lymphocyte #: 2.2 10*3/uL (ref 1.0–3.6)
Lymphocyte %: 42.8 %
MCH: 31.5 pg (ref 26.0–34.0)
MCHC: 33.1 g/dL (ref 32.0–36.0)
MCV: 95 fL (ref 80–100)
Monocyte #: 0.7 x10 3/mm (ref 0.2–0.9)
Monocyte %: 12.7 %
Neutrophil #: 2.1 10*3/uL (ref 1.4–6.5)
Neutrophil %: 40.1 %
Platelet: 302 10*3/uL (ref 150–440)
RBC: 3.92 10*6/uL (ref 3.80–5.20)
RDW: 13.5 % (ref 11.5–14.5)
WBC: 5.2 10*3/uL (ref 3.6–11.0)

## 2014-07-20 ENCOUNTER — Emergency Department
Admit: 2014-07-20 | Disposition: A | Discharge status: Home / Self Care | Payer: Self-pay | Admitting: Emergency Medicine

## 2014-07-20 LAB — URINALYSIS, COMPLETE
Bacteria: NONE SEEN
Bilirubin,UR: NEGATIVE
Blood: NEGATIVE
Glucose,UR: NEGATIVE mg/dL (ref 0–75)
Ketone: NEGATIVE
Leukocyte Esterase: NEGATIVE
Nitrite: NEGATIVE
Ph: 6 (ref 4.5–8.0)
Protein: NEGATIVE
Specific Gravity: 1.005 (ref 1.003–1.030)
Squamous Epithelial: NONE SEEN

## 2014-07-20 LAB — CBC WITH DIFFERENTIAL/PLATELET
Basophil #: 0.1 10*3/uL (ref 0.0–0.1)
Basophil %: 0.8 %
Eosinophil #: 0.1 10*3/uL (ref 0.0–0.7)
Eosinophil %: 1.9 %
HCT: 39.9 % (ref 35.0–47.0)
HGB: 13 g/dL (ref 12.0–16.0)
Lymphocyte #: 2.2 10*3/uL (ref 1.0–3.6)
Lymphocyte %: 29 %
MCH: 31.3 pg (ref 26.0–34.0)
MCHC: 32.6 g/dL (ref 32.0–36.0)
MCV: 96 fL (ref 80–100)
Monocyte #: 0.8 x10 3/mm (ref 0.2–0.9)
Monocyte %: 10.4 %
Neutrophil #: 4.4 10*3/uL (ref 1.4–6.5)
Neutrophil %: 57.9 %
Platelet: 387 10*3/uL (ref 150–440)
RBC: 4.15 10*6/uL (ref 3.80–5.20)
RDW: 13.1 % (ref 11.5–14.5)
WBC: 7.5 10*3/uL (ref 3.6–11.0)

## 2014-07-20 LAB — COMPREHENSIVE METABOLIC PANEL
Albumin: 4.2 g/dL
Alkaline Phosphatase: 64 U/L
Anion Gap: 9 (ref 7–16)
BUN: 16 mg/dL
Bilirubin,Total: 0.3 mg/dL
Calcium, Total: 8.8 mg/dL — ABNORMAL LOW
Chloride: 98 mmol/L — ABNORMAL LOW
Co2: 29 mmol/L
Creatinine: 1.07 mg/dL — ABNORMAL HIGH
EGFR (African American): >60
EGFR (Non-African Amer.): 58 — ABNORMAL LOW
Glucose: 109 mg/dL — ABNORMAL HIGH
Potassium: 4.1 mmol/L
SGOT(AST): 20 U/L
SGPT (ALT): 17 U/L
Sodium: 136 mmol/L
Total Protein: 7.5 g/dL

## 2014-07-20 LAB — TROPONIN I: Troponin-I: <0.03 ng/mL

## 2014-07-20 LAB — LIPASE, BLOOD: Lipase: 68 U/L — ABNORMAL HIGH

## 2014-07-28 ENCOUNTER — Emergency Department
Admit: 2014-07-28 | Disposition: A | Discharge status: Home / Self Care | Payer: Self-pay | Admitting: Emergency Medicine

## 2014-07-28 LAB — COMPREHENSIVE METABOLIC PANEL
Albumin: 4.2 g/dL
Alkaline Phosphatase: 71 U/L
Anion Gap: 5 — ABNORMAL LOW (ref 7–16)
BUN: 11 mg/dL
Bilirubin,Total: 0.5 mg/dL
Calcium, Total: 9 mg/dL
Chloride: 104 mmol/L
Co2: 28 mmol/L
Creatinine: 0.68 mg/dL
EGFR (African American): >60
EGFR (Non-African Amer.): >60
Glucose: 105 mg/dL — ABNORMAL HIGH
Potassium: 3.7 mmol/L
SGOT(AST): 26 U/L
SGPT (ALT): 17 U/L
Sodium: 137 mmol/L
Total Protein: 7.4 g/dL

## 2014-07-28 LAB — URINALYSIS, COMPLETE
Bacteria: NONE SEEN
Bilirubin,UR: NEGATIVE
Blood: NEGATIVE
Glucose,UR: NEGATIVE mg/dL (ref 0–75)
Ketone: NEGATIVE
Leukocyte Esterase: NEGATIVE
Nitrite: NEGATIVE
Ph: 7 (ref 4.5–8.0)
Protein: NEGATIVE
Specific Gravity: 1.005 (ref 1.003–1.030)

## 2014-07-28 LAB — CBC
HCT: 39.8 % (ref 35.0–47.0)
HGB: 13.3 g/dL (ref 12.0–16.0)
MCH: 31.3 pg (ref 26.0–34.0)
MCHC: 33.4 g/dL (ref 32.0–36.0)
MCV: 94 fL (ref 80–100)
Platelet: 319 10*3/uL (ref 150–440)
RBC: 4.24 10*6/uL (ref 3.80–5.20)
RDW: 13 % (ref 11.5–14.5)
WBC: 5.2 10*3/uL (ref 3.6–11.0)

## 2014-07-28 LAB — LIPASE, BLOOD: Lipase: 42 U/L

## 2014-08-01 NOTE — Op Note (Signed)
PATIENT NAME:  Linda Mejia, Linda Mejia MR#:  [id number] DATE OF BIRTH:  1956-02-25  DATE OF PROCEDURE:  01/13/2012  PREOPERATIVE DIAGNOSIS: Postmenopausal vaginal bleeding.   POSTOPERATIVE DIAGNOSIS: Postmenopausal vaginal bleeding.   PROCEDURES PERFORMED: Dilation and curettage, attempted hysteroscopy.   SURGEON: Wonda Cheng. Laurey Morale, M.D.   OPERATIVE FINDINGS: The patient is postmenopausal and the cervix did not allow introduction of the hysteroscope, small amount of tissue on endometrial curettage.   DESCRIPTION OF PROCEDURE: After adequate general anesthesia, the patient was prepped and draped in routine fashion. The cervix was dilated with ease, but I was unable to insert the hysteroscope secondary to the patient's postmenopausal status. Uterine cavity was systematically curetted with return of a small amount of normal-appearing tissue. Exploration using polyp forceps removed no further tissue. The patient tolerated the procedure well and left the Operating Room in good condition. Sponge and needle counts were said to be correct at the end of the procedure. ____________________________ Wonda Cheng. Laurey Morale, MD pjr:slb D: 01/13/2012 11:05:43 ET T: 01/13/2012 11:20:26 ET JOB#: [id number]  cc: Wonda Cheng. Laurey Morale, MD, <Dictator> Meindert A. Brunetta Genera, MD Rosina Lowenstein MD ELECTRONICALLY SIGNED 01/14/2012 22:42

## 2014-08-03 ENCOUNTER — Emergency Department
Admit: 2014-08-03 | Disposition: A | Discharge status: Home / Self Care | Payer: Self-pay | Admitting: Emergency Medicine

## 2014-08-03 LAB — COMPREHENSIVE METABOLIC PANEL
Albumin: 3.9 g/dL
Alkaline Phosphatase: 63 U/L
Anion Gap: 7 (ref 7–16)
BUN: 15 mg/dL
Bilirubin,Total: <0.1 mg/dL — ABNORMAL LOW
Calcium, Total: 8.3 mg/dL — ABNORMAL LOW
Chloride: 108 mmol/L
Co2: 27 mmol/L
Creatinine: 0.85 mg/dL
EGFR (African American): >60
EGFR (Non-African Amer.): >60
Glucose: 102 mg/dL — ABNORMAL HIGH
Potassium: 3.6 mmol/L
SGOT(AST): 24 U/L
SGPT (ALT): 12 U/L — ABNORMAL LOW
Sodium: 142 mmol/L
Total Protein: 6.9 g/dL

## 2014-08-03 LAB — CBC WITH DIFFERENTIAL/PLATELET
Basophil #: 0.1 10*3/uL (ref 0.0–0.1)
Basophil %: 1.4 %
Eosinophil #: 0.2 10*3/uL (ref 0.0–0.7)
Eosinophil %: 4.2 %
HCT: 39.2 % (ref 35.0–47.0)
HGB: 12.8 g/dL (ref 12.0–16.0)
Lymphocyte #: 2.6 10*3/uL (ref 1.0–3.6)
Lymphocyte %: 51.4 %
MCH: 31 pg (ref 26.0–34.0)
MCHC: 32.6 g/dL (ref 32.0–36.0)
MCV: 95 fL (ref 80–100)
Monocyte #: 0.7 x10 3/mm (ref 0.2–0.9)
Monocyte %: 13.3 %
Neutrophil #: 1.5 10*3/uL (ref 1.4–6.5)
Neutrophil %: 29.7 %
Platelet: 278 10*3/uL (ref 150–440)
RBC: 4.12 10*6/uL (ref 3.80–5.20)
RDW: 12.9 % (ref 11.5–14.5)
WBC: 5.1 10*3/uL (ref 3.6–11.0)

## 2014-08-03 LAB — URINALYSIS, COMPLETE
Bacteria: NONE SEEN
Bilirubin,UR: NEGATIVE
Blood: NEGATIVE
Glucose,UR: NEGATIVE mg/dL (ref 0–75)
Ketone: NEGATIVE
Leukocyte Esterase: NEGATIVE
Nitrite: NEGATIVE
Ph: 6 (ref 4.5–8.0)
Protein: NEGATIVE
Specific Gravity: 1.006 (ref 1.003–1.030)

## 2014-08-03 LAB — TROPONIN I: Troponin-I: <0.03 ng/mL

## 2014-08-03 LAB — LIPASE, BLOOD: Lipase: 50 U/L

## 2014-08-04 NOTE — H&P (Signed)
PATIENT NAME:  Linda Mejia, Linda Mejia MR#:  [id number] DATE OF BIRTH:  05-Aug-1955  DATE OF ADMISSION:  01/29/2013  REFERRING PHYSICIAN: Dr. Cinda Quest  FAMILY PHYSICIAN:  None.  REASON FOR ADMISSION:  Pleuritic chest pain.   HISTORY OF PRESENT ILLNESS: The patient is a 59 year old female who is a nonsmoker, but had recent rib fractures from a fall. Presents with right-sided pleuritic chest pain associated with shortness of breath. In the Emergency Room, the patient was noted to be febrile, tachycardic, and hypoxic. Pneumonia was noted on CT. She is now admitted for further evaluation.   PAST MEDICAL HISTORY: 1.  History of pneumonia.  2.  ASCVD, status post MI.   3.  Status post PTCA with stent placement. 4.  Benign hypertension.  5.  Bipolar disorder.  6.  Depression.  7.  Panic disorder.  8.  Degenerative disk disease.  9.  History of foot surgery.  10.  Status post appendectomy.   MEDICATIONS: 1.  Cymbalta 30 mg p.o. daily.  2.  Adderall 30 mg p.o. daily.  3.  Valium 10 mg p.o. t.i.d.   ALLERGIES: No known drug allergies.   SOCIAL HISTORY: Negative for alcohol or tobacco abuse.   FAMILY HISTORY:  Positive for diabetes, stroke, and hypertension.   REVIEW OF SYSTEMS:    CONSTITUTIONAL: The patient has had fever, but no change in weight.  EYES: No blurry vision or double vision. No glaucoma.  ENT: No tinnitus or hearing loss. No nasal discharge or bleeding. No difficulty swallowing.  RESPIRATORY: The patient has had cough, but no wheezing or hemoptysis. No painful respiration.  CARDIOVASCULAR: No orthopnea or palpitations. No syncope.  GASTROINTESTINAL: No nausea, vomiting, or diarrhea. No abdominal pain. No change in bowel habits.  GENITOURINARY: No dysuria or hematuria. No incontinence.  ENDOCRINE: No polyuria or polydipsia. No heat or cold intolerance.  HEMATOLOGIC: The patient denies anemia, easy bruising or bleeding.  LYMPHATIC: No swollen glands.  MUSCULOSKELETAL: The patient has  back pain, but denies pain in her neck, shoulders, knees or hips. No gout.  NEUROLOGIC: No numbness or migraines. Denies stroke or seizures.  PSYCHIATRIC: The patient denies anxiety, insomnia or depression.   PHYSICAL EXAMINATION: GENERAL: The patient is in no acute distress.  VITAL SIGNS: Currently remarkable for temperature of 99.8 with a heart rate of 107, a respiratory rate of 20 and a blood pressure of 108/66.  HEENT: Normocephalic, atraumatic. Pupils equally round and reactive to light and accommodation. Extraocular  movements are intact. Sclerae are not icteric. Conjunctivae are clear. Oropharynx is dry but clear.  NECK: Supple, without JVD or bruits. No adenopathy or thyromegaly is noted.  LUNGS: Reveal basilar rhonchi bilaterally, right greater than left. Some wheezes. No rales. No dullness. Respiratory effort is normal.  CARDIAC EXAM: A rapid rate, with a regular rhythm. Normal S1 and S2. No significant rubs, murmurs, or gallops. PMI is nondisplaced. Chest wall is nontender.  ABDOMEN: Soft, nontender, with normoactive bowel sounds. No organomegaly or masses were appreciated. No hernias or bruits were noted.  EXTREMITIES: Without clubbing, cyanosis, edema. Pulses were 2+ bilaterally.  SKIN: Warm and dry, without rash or lesions.  NEUROLOGIC EXAM: Revealed cranial nerves II through XII grossly intact. Deep tendon reflexes were symmetric. Motor and sensory exams nonfocal.  PSYCHIATRIC EXAM: Revealed the patient was alert and oriented to person, place, and time. She was cooperative and used good judgment.   LABORATORY DATA: EKG revealed sinus tachycardia at 137 beats per minute, with no acute ischemic changes.  CT of the chest revealed bilateral lower lobe infiltrates, right greater than left. White count was 13.9, with a hemoglobin of 12.7. Glucose was 166, with a BUN of 10, creatinine 0.83, and a sodium of 136, with potassium of 3.3. Magnesium was 1.2. Troponin was less than 0.02. Urinalysis  was unremarkable.   ASSESSMENT: 1.  Pleuritic chest pain.  2.  Hypokalemia.  3.  Hypomagnesemia.  4.  Pneumonia.  5.  Acute respiratory failure.  6.  Panic disorder.  7.  Atherosclerotic cardiovascular disease.   PLAN: The patient will be admitted to the floor with off unit telemetry and maintained on oxygen, IV fluids, IV steroids and IV antibiotics. Will begin SVNs. Will continue her Cymbalta, Adderall and Valium. Will wean oxygen as tolerated. Will follow her sugars while on steroids. Follow up labs and chest x-ray in the morning. Will supplement potassium through her IV fluids, and give IV magnesium now. Further treatment and evaluation will depend upon the patient's progress.   Total time spent on this patient was 50 minutes.     ____________________________ Leonie Douglas Doy Hutching, MD jds:mr D: 01/29/2013 20:34:42 ET T: 01/29/2013 21:13:21 ET JOB#: [id number]  cc: Leonie Douglas. Doy Hutching, MD, <Dictator> Costella Schwarz Lennice Sites MD ELECTRONICALLY SIGNED 01/31/2013 8:12

## 2014-08-04 NOTE — Discharge Summary (Signed)
PATIENT NAME:  Linda Mejia, Linda Mejia MR#:  [id number] DATE OF BIRTH:  06-19-1955  DATE OF ADMISSION:  01/29/2013 DATE OF DISCHARGE:  02/01/2013  ADMITTING DIAGNOSIS: Right-sided pleuritic chest pain.   DISCHARGE DIAGNOSES: 1.  Right-sided pleuritic chest pain, likely musculoskeletal due to injury.  2. Suspected pneumonia in the right side of the lungs. Relative of hypoxia, resolved.  3.  Supraventricular tachycardia,  sinus tachycardia, questionable related to amphetamines, resolved.  4.  History of bipolar disorder.  5.  Fever.  6.  History of coronary artery disease, as well as hypertension.   DISCHARGE CONDITION: Stable.   DISCHARGE MEDICATIONS: The patient is to continue Adderall 30 mg twice daily, diazepam 10 mg 3 times daily.   New medications: Acetaminophen oxycodone 325/5 mg 1 tablet every four hours as needed, aspirin 81 mg p.o. daily, metoprolol tartrate 25 mg p.o. twice daily, Zithromax 250 mg p.o. daily for four more days, fluoxetine 30 mg p.o. daily and Pravachol 20 mg p.o. at bedtime.   HOME OXYGEN: None.   DIET: 2 grams salt, low fat, low cholesterol, carbohydrate-controlled diet, regular consistency.   ACTIVITY LIMITATIONS: As tolerated.    FOLLOWUP APPOINTMENT: With Dr. Rockey Situ in two days after discharge, [facility] in two days after discharge.   CONSULTANTS: Care management, social work.   RADIOLOGIC STUDIES: Chest x-ray PA and lateral 01/29/2013, showed no acute cardiopulmonary disease.   CT scan of chest for pulmonary embolism 01/29/2013, revealed changing concerning for bilateral lower lobe atelectasis versus developing pneumonia, no pulmonary embolism. No thoracic aortic aneurysm or dissection. Borderline mediastinal and right hilar adenopathy were noted. Chest, PA and lateral 01/30/2013, showed no acute cardiopulmonary disease. Again, the patient's repeated chest x-ray PA and lateral, 02/01/2013, showed no acute change.   Kidney ultrasound revealed normal renal  ultrasound.   Bilateral diagnostic mammogram 02/01/2013, showed no evidence of malignancy.   Echocardiogram 01/31/2013, revealed a left ventricular ejection fraction by visual estimation 50% to 55%, low-normal global left ventricular systolic function, mildly dilated left atrium, mild to moderate mitral valve regurgitation, mildly elevated pulmonary arterial systolic pressure, mild to moderate tricuspid regurgitation.   HOSPITAL COURSE:  The patient is 59 year old Caucasian female with past medical history significant for history of bipolar disorder, history of coronary artery disease, not on any medications for coronary artery disease, history of hypertension, depression, panic disorder, who presents to the hospital with complaints of pleuritic chest pain. Apparently, the patient was doing well until she helps family members with moving a bed and she started having right-sided pleuritic chest pain. She was also having some fevers with temperature of 100 and above. She presented to the Emergency Room for further evaluation in no acute distress and her temperature in the Emergency Room, was 99.8, heart rate was 107, respiratory rate was 20, blood pressure 108/66.  Physical examination was unremarkable. The patient had bilateral rhonchi, right more than the left on lung exam and some wheezes. Otherwise, physical exam was unremarkable. The patient's EKG revealed sinus tachy at 137 beats per minute with no acute ischemic changes. Chest x-ray was unremarkable; however, the patient's CT scan revealed questionable pneumonia. The patient's lab data done in the Emergency Room 01/29/2013, revealed glucose of 166, potassium 3.3, phosphorous 2.4, magnesium 1.2, otherwise BMP was unremarkable. The patient's liver enzymes revealed albumin level of 3.3; otherwise normal.  Cardiac enzymes x 2 were within normal limits. The patient's blood cell count was elevated to 13.9, hemoglobin was 12.7, platelet count was 294. Absolute  neutrophil  count was not elevated to 11.8. Blood cultures taken on in the Emergency Room 01/29/2013, showed no growth. The patient influenza testing was also negative. Urinalysis was remarkable for 4 red blood cells, 9 white blood cells, 3+ leukocytes esterase. The patient was initiated on antibiotic therapy with Rocephin and Zithromax and her condition somewhat improved; however, her pleuritic chest pain was somewhat relentless. We initially started her on Norco; however, with no significant improvement and then changed her to Percocet. Percocet works for her quite well. He felt that the patient's pleuritic chest pain was very likely musculoskeletal. As mentioned above, CT scan of the chest did not show any pulmonary embolism. In regards to suspected pneumonia, since the patient febrile and had white blood cell count elevation and her chest x-ray was concerning for possible pneumonia, we felt that she very likely had pneumonia. However, she did not produce any sputum, and we were not able to get any sputum results. Repeat chest x-ray did not show any pneumonia. The patient is, however, to continue antibiotic therapy to complete course for 4 more days. In regards to hypoxia, the patient was relatively hypoxic while she was in the hospital as well as tachycardic requiring CT scan of her chest to rule out pulmonary embolism in view of ongoing pleuritic chest pain. However, the patient was negative for pulmonary embolism and her hypoxia resolved with therapy. The patient presented to the Emergency Room with a SVT, sinus tachycardia. We initiated her on metoprolol and supplemented her low magnesium levels. Aspirin as well as metoprolol improved her heart rate. We had echocardiogram done which revealed normal ejection fraction. However, since the patient had SVT, we still had the concern that the patient may have had an element of congestive heart failure. For this reason, we want her to be seen by cardiologist, Dr.  Rockey Situ in next few days after discharge. No chest pains, otherwise were reported and her cardiac enzymes were negative. The patient is to continue aspirin as well as metoprolol for heart rate control. She was also given Pravachol for history of coronary artery disease. In regards to bipolar disorder, attention deficit/hyperactivity disorder, the patient is to continue her usual outpatient management. No changes were made for hypertension. The patient is to continue metoprolol. We would like to have her be initiated on ACE inhibitor; however, the patient's blood pressure was somewhat on the low side from 951 to 884 systolic, so we felt that the patient's ACE inhibitor should be initiated as outpatient. The patient is being discharged in stable condition with the above-mentioned medications and follow-up.   TIME SPENT:  40 minutes  ____________________________ [name], MD rv:cc D: 02/01/2013 18:33:39 ET T: 02/01/2013 20:56:53 ET JOB#: [id number]  cc: [name], MD, <Dictator> Minna Merritts, MD [facility] [place] MD ELECTRONICALLY SIGNED 02/01/2013 23:21

## 2014-08-13 NOTE — H&P (Signed)
PATIENT NAME:  Linda Mejia, Linda Mejia MR#:  [id number] DATE OF BIRTH:  Feb 19, 1956  DATE OF ADMISSION:  05/18/2014  REFERRING PHYSICIAN:  Valli Glance. Owens Shark, MD   PRIMARY CARE PHYSICIAN:  Nonlocal   ADMITTING DIAGNOSIS:  Chest pain.   HISTORY OF PRESENT ILLNESS:  This is a 59 year old Caucasian female who presents to the Emergency Department via EMS complaining of chest pain. Her chest began to hurt while she was cleaning out her refrigerator. The pain was 10 out of 10 in severity. She states that it started in her upper back and radiated to her left shoulder. She gradually developed tightness in her anterior central chest that was associated with shortness of breath. Once the pain radiated to her anterior chest, she began to feel discomfort on both sides of her neck. Since arrival to the Emergency Department and receiving aspirin and morphine, the patient's chest pain has decreased to 4 out of 5 in severity and is intermittent. She admits to feeling particularly fatigued the last 5 days. She admits to some nausea during the chest pain as well. Due to the patient's history of coronary artery disease and ongoing chest pain, the Emergency Department called for admission.   REVIEW OF SYSTEMS: CONSTITUTIONAL:  The patient denies fever, but admits to fatigue.  EYES:  Denies blurred vision or inflammation.  EARS, NOSE, AND THROAT:  Denies tinnitus or sore throat.  RESPIRATORY:  Denies cough, but admits to shortness of breath associated with chest pain.  CARDIOVASCULAR:  Admits to anterior chest tightness, but denies palpitations, orthopnea, or paroxysmal nocturnal dyspnea.  GASTROINTESTINAL:  Admits to nausea, but denies vomiting. The patient also admits to diarrhea for the last few days.  GENITOURINARY:  Denies dysuria or increased frequency or hesitancy of urination.  ENDOCRINE:  Denies polyuria or polydipsia.  HEMATOLOGIC AND LYMPHATIC:  Denies easy bruising or bleeding.  INTEGUMENTARY:  Denies rashes or  lesions.  MUSCULOSKELETAL:  Denies arthralgias or myalgias.  NEUROLOGIC:  Denies numbness in her extremities or dysarthria.  PSYCHIATRIC:  Denies suicidal ideation or depression.   PAST MEDICAL HISTORY:  Coronary artery disease status post myocardial infarction, hypertension, chronic lower back pain, and osteoarthritis.   PAST SURGICAL HISTORY:  Two C-sections, an appendectomy, an oophorectomy secondary to ectopic pregnancy, and a partial colectomy.   SOCIAL HISTORY:  The patient lives with her daughter. She is a former smoker and occasionally drinks alcohol. She denies any illicit drug use.   FAMILY HISTORY:  The patient's father and siblings are deceased of coronary artery disease. Her mother is deceased of a cerebrovascular accident.   MEDICATIONS: 1.  Adderall 30 mg 1 tablet p.o. b.i.d.  2.  Aspirin 81 mg 1 tablet p.o. daily.  3.  Diazepam 10 mg 1 tab p.o. t.i.d.  4.  Metoprolol tartrate 25 mg 1 tab p.o. b.i.d.   ALLERGIES:  No known drug allergies.   PERTINENT LABORATORY RESULTS AND RADIOGRAPHIC FINDINGS:  Serum glucose is 9, BUN 8, serum creatinine 0.75, serum sodium 140, potassium 3.7, chloride 103, bicarbonate 29, calcium 8.5, serum albumin 2.8, alkaline phosphatase 73, AST 29, and ALT is 18. Troponin is negative. White blood cell count is 8.2, hemoglobin 14.5, hematocrit 42.6, platelet count 355,000, and MCV is 95. INR is 0.8. Urinalysis shows 9 white blood cells per high-power field and 1+ leukocyte esterase, but is negative for nitrites and bacteria. The patient is asymptomatic for urinary tract infection. Chest x-ray shows atelectasis in the right lung base.   PHYSICAL EXAMINATION: VITAL SIGNS:  Temperature is 98.2, pulse 93, respirations 20, blood pressure 154/87, and pulse oximetry is 96% on room air.  GENERAL:  The patient is alert and oriented x 3 and in no apparent distress.  HEENT:  Normocephalic, atraumatic. Pupils are equal, round, and reactive to light and  accommodation. Extraocular movements are intact. Mucous membranes are moist.  NECK:  Trachea is midline. No adenopathy. Thyroid is nonpalpable and nontender.  CHEST:  Symmetric and atraumatic.  CARDIOVASCULAR:  Regular rate and rhythm. Normal S1 and S2. No rubs, clicks, or murmurs appreciated. The patient has no carotid bruits. She has 2+ radial and dorsalis pedis pulses bilaterally.  LUNGS:  Clear to auscultation bilaterally. Normal effort and excursion.  ABDOMEN:  Positive bowel sounds. Soft, nontender, nondistended. No hepatosplenomegaly.  GENITOURINARY:  Deferred.  MUSCULOSKELETAL:  The patient moves all 4 extremities equally. She has full range of motion in all 4 extremities.  SKIN:  Warm and dry. There are no rashes or lesions.  EXTREMITIES:  No clubbing, cyanosis, or edema.  PSYCHIATRIC:  Mood is normal. Affect is congruent. The patient has excellent insight and judgment into her medical condition.  NEUROLOGIC:  Cranial nerves II through XII are grossly intact.   ASSESSMENT AND PLAN:  This is a 59 year old female with chest pain.   1.  Coronary artery disease. The patient has a history of myocardial infarction and continues to have chest pain despite her reassuring Emergency Department evaluation. She has 2 stents in place already, but is not on any antiplatelet agents nor is she on full-dose aspirin. I have increased her aspirin dosage, and I have loaded her with Plavix. I have ordered a lipid panel, and I will start statin therapy for plaque stabilization once we have obtained a blood sample.  2.  Hypertension. Continue metoprolol.  3.  Overweight. The patient's body mass index is barely over 25. I have encouraged her to exercise as tolerated and eat a healthy diet.  4.  Deep vein thrombosis prophylaxis with heparin.  5.  Gastrointestinal prophylaxis. None, as the patient is not critically ill.  CODE STATUS:  This patient is a full code.   TIME SPENT ON ADMISSION ORDERS AND PATIENT  CARE:  Approximately 35 minutes.   ____________________________ Norva Riffle. Marcille Blanco, MD msd:nb D: 05/18/2014 05:17:02 ET T: 05/18/2014 06:06:34 ET JOB#: [id number]  cc: Norva Riffle. Marcille Blanco, MD, <Dictator> Norva Riffle Amaria Mundorf MD ELECTRONICALLY SIGNED 05/25/2014 0:08

## 2014-08-13 NOTE — Discharge Summary (Signed)
PATIENT NAME:  Linda Mejia, Linda Mejia MR#:  [id number] DATE OF BIRTH:  June 12, 1955  DATE OF ADMISSION:  07/11/2014 DATE OF DISCHARGE:  07/12/2014  ADMITTING PHYSICIAN: Dr. Gladstone Lighter.   DISCHARGING PHYSICIAN:  Dr. Gladstone Lighter.    PRIMARY CARE PHYSICIAN: Dr. Elijio Miles.  PRIMARY CARDIOLOGIST: Dr. Neoma Laming.  CONSULTATIONS IN THE HOSPITAL:  GI consultation by Dr. Gustavo Lah.   DISCHARGE DIAGNOSES:  1.  Multiple nonbleeding gastric ulcers from [facility] powder use.  2.  Attention deficit disorder.  3.  Panic attacks.  4.  Coronary artery disease.  5.  Chronic low back pain.  6.  Hypertension.   DISCHARGE HOME MEDICATIONS: 1.  Tramadol 50 mg p.o. q. 6 hours p.r.n. for pain.  2.  Adderall 30 mg p.o. b.i.d.  3.  Diazepam 10 mg p.o. t.i.d.  4.  Aspirin 81 mg p.o. daily.  5.  Imdur 30 mg p.o. daily.  6.  Atorvastatin 20 mg p.o. daily.  7.  Ambien 10 mg p.o. daily.  8.  Prilosec 20 mg p.o. b.i.d.  9.  Metoprolol 25 mg p.o. b.i.d.   DISCHARGE DIET:  Full liquid for 2 more days followed by low residue for 5 days and then a regular low sodium after that.   DISCHARGE ACTIVITY: As tolerated.   FOLLOWUP INSTRUCTIONS:  1.  PCP followup in 1 to 2 weeks.  2.  GI follow-up with Dr. Gustavo Lah in 6 to 8 weeks for repeat EGD.  3.  Cardiology followup as prior scheduled.   LABORATORY AND IMAGING STUDIES PRIOR TO DISCHARGE:  Endoscopy done on 07/12/2014 showing multiple ulcers in the antrum and incisura of the stomach.  Recommend no NSAIDs, low-dose aspirin should be fine.  Helicobacter pylori antibody panel is pending at the time of discharge. WBC 5.2, hemoglobin 12.3, hematocrit 37.4, platelet count 302,000. Sodium 140, potassium 3.8, chloride 111, bicarbonate 27, BUN 10, creatinine 0.58, glucose 99 and calcium of 8.2.   BRIEF HOSPITAL COURSE: Linda Mejia is a 59 year old, pleasant, Caucasian female with past medical history significant for coronary artery disease, hypertension, recent cardiac  catheterization showing patent prior stents. No new disease.  Collaterals found 100% occluded RCA. She was discharged on Plavix 10 days ago prior to this admission; however, the patient has not started taking that yet, but she does use a lot of BC powders for her back pain. She presented to Dr. Bailey Mech office for epigastric pain and hematemesis and was directly admitted to the hospital for a GI evaluation. Her hemoglobin has remained stable throughout the hospital course. She did not need any blood transfusions. She was seen by GI. She was placed on Protonix drip and upper GI endoscopy did reveal that she had multiple nonbleeding ulcers in the gastric incisura and also the antrum.  She was strongly advised to stay away from [facility] powders. No restarting of the Plavix, only aspirin 81 mg should be good to take with her cardiac history. She will need to follow up Dr. Gustavo Lah in 6 to 8 weeks for repeat EGD.  She will need to be on b.i.d. proton pump inhibitor which she was discharged on.   CAD recent with recent catheterization with medical management, 100% occluded RCA with collaterals and previous open patent stent. Continue aspirin. No Plavix.  Last stent was more than 5 years ago. She is on statin and Imdur and metoprolol.   All her other home medications are being continued without any changes.   DISCHARGE CONDITION: Stable.   DISCHARGE DISPOSITION: Home.  TIME SPENT ON DISCHARGE:  45 minutes.   ___________________________ Gladstone Lighter, MD rk:sp D: 07/13/2014 13:30:03 ET T: 07/13/2014 13:49:05 ET JOB#: [id number]  cc: Gladstone Lighter, MD, <Dictator> Lollie Sails, MD Venetia Maxon. Elijio Miles, MD Gladstone Lighter MD ELECTRONICALLY SIGNED 07/20/2014 12:24

## 2014-08-13 NOTE — Consult Note (Signed)
PATIENT NAME:  Linda Mejia, Linda Mejia MR#:  [id number] DATE OF BIRTH:  [birth year]  DATE OF CONSULTATION:  07/11/2014  REFERRING PHYSICIAN:   CONSULTING PHYSICIAN:  Lollie Sails, MD  Patient of Dr. Tressia Miners.   REASON FOR CONSULTATION: Epigastric pain and hematemesis.   HISTORY OF PRESENT ILLNESS: Linda Mejia is a 59 year old Caucasian female who states that she was in her usual health until this past Friday. At that time, she developed some central abdominal "stomach ache" with some diarrhea later that day. She said that this resolved and then she felt better on Saturday and Sunday; however, yesterday she began to have a lot of problems with upper epigastric pain, nausea, vomiting, and heartburn. She had some coffee-ground type material emesis at about 2:00 p.m. yesterday. This occurred intermittently until about 7:00 p.m., but she has not had any emesis since then. She states that outside of the above episode she has had no nausea, vomiting, or abdominal pain. She does take an 81 mg aspirin daily. She states that about 3 weeks ago she saw her cardiologist, Dr. Clayborn Bigness, who wrote her a prescription for Plavix, or rather wanted her to start taking Plavix; however, she states that that prescription was never filled and she has not been taking it. She does, however, take Goody powders on a daily basis. She states that she believes that she was treated for Helicobacter pylori many years ago. However, at that time she did not undergo an EGD. She did, however, undergo both an EGD and colonoscopy on 04/21/2003. This was for hematemesis, weight loss, change of bowel habits, and diarrhea, as well as heartburn. On EGD she was found to have a grade 1 erosive esophagitis, as well as some mild gastritis. The colonoscopy was normal and the impression was irritable bowel. She generally has a bowel movement about every other day, occasionally has to use a stool softener. She does have a history of some hemorrhoids. She denies any  black stool, blood in the stools, or slimy stools including over the past several days. Again, she does have a history of coronary artery disease and coronary stenting done in 1999 and approximately 2011. She states she has not been taking Plavix, but has been taking an 81 mg aspirin. She also has been taking Goody powders on a daily basis.   PAST MEDICAL HISTORY:  1.  History of depression.  2.  Bipolar disorder.  3.  Panic attacks. 4.  "Bulging disk."  5.  Myocardial infarction, remote.  6.  Hypertension.  7.  History of pneumonia, remote.  8.  History of abdominal surgery secondary to intussusception.  9.  Foot surgery.  10.   C-section, remote.  11.   Appendectomy.  12.   Cardiac stents as noted above.   GASTROINTESTINAL FAMILY HISTORY: Negative for colorectal cancer, liver disease or ulcers.   MEDICATIONS: Adderall 30 mg 1 twice a day, Ambien 10 mg at bedtime, 81 mg aspirin daily, atorvastatin 20 mg once a day, clopidogrel 75 mg daily please note below, diazepam 10 mg one 3 times a day, and isosorbide mononitrate 30 mg once a day.   ALLERGIES: She has no known drug allergies.   PHYSICAL EXAMINATION: VITAL SIGNS: Temperature is 97.8, pulse 79, respirations 20, blood pressure 176/94 repeated 160/84, pulse oximetry 91%.  GENERAL: She is a 59 year old Caucasian female, no acute distress.  HEENT: Normocephalic, atraumatic. Eyes are anicteric. Nose: Septum midline. No lesions. Oropharynx: No lesions.  NECK: Supple. No JVD. No lymphadenopathy. No thyromegaly.  HEART: Regular rate and rhythm without rub or gallop.  LUNGS: Bilaterally clear.  ABDOMEN: Soft. There is tenderness to palpation in the epigastric region extending mostly in the upper epigastric region. There is no rebound. Bowel sounds are positive, normoactive. There is no organomegaly or masses felt.  RECTAL: Anorectal exam deferred.  EXTREMITIES: No clubbing, cyanosis, or edema.  NEUROLOGICAL: Cranial nerves II through XII  grossly intact. Muscle strength bilaterally equal and symmetric.  LABORATORIES INCLUDE THE FOLLOWING: On admission to the hospital, she had a glucose of 96, BUN 16, creatinine 0.78, sodium 139, potassium 4.0, chloride 102, bicarbonate 27, calcium 9.1. Hepatic profile normal on admission to the hospital. She had a hemogram showing a white count of 6.7, hemoglobin and hematocrit 14.1/43.8, platelet count of 362,000. Her MCV is 96. She has been typed and screened. A repeat hemoglobin 6 hours after the first one was 12.6. She has had no bowel movements since admission. She has had no further emesis since last night. There has been no imaging.   ASSESSMENT: Coffee-ground emesis as noted. This is in the setting of daily use of Goody powders. She also takes a daily 81 mg aspirin. Fortunately, the patient did not start on the Plavix per her admission that was prescribed about 3 weeks ago by her cardiologist. This was verified by nursing. Concern would be for recurrent peptic ulcer disease that is NSAID-related in light of her using Goody powders on a daily basis. Differential diagnosis would include other GI bleeding type such as a Dieulafoy lesion, Mallory-Weiss tear (the patient stated that the first time she threw up there was coffee-ground material, however).   RECOMMENDATION: We will proceed with EGD tomorrow. I have discussed the risks, benefits, and complications of procedure to include, but not limited to bleeding, infection, perforation, the risk of sedation and she wishes to proceed.    ____________________________ Lollie Sails, MD mus:at D: 07/11/2014 18:00:26 ET T: 07/11/2014 18:18:21 ET JOB#: [id number]  cc: Lollie Sails, MD, <Dictator> Lollie Sails MD ELECTRONICALLY SIGNED 07/25/2014 14:26

## 2014-08-13 NOTE — Consult Note (Signed)
Brief Consult Note: Diagnosis: coffeeground emesis.   Patient was seen by consultant.   Consult note dictated.   Recommend to proceed with surgery or procedure.   Discussed with Attending MD.   Comments: Please see full GI consult#[id number].   Patietn presenting with coffeeground emesis and epigastric pain.  No repeat emesis,  since 7pm yesterday.  No melena.  hemodynamically stable.  Patient uses "goodie powders" on a daily basis for musculoskeletal pain, and 81 mg asa daily.  Was prescribed plavix 3 weeks ago, but states she never filled the prescription.    Continue iv ppi, serial hgb, transfuse if needed.  Will plan for egd tomorrow pm.  I have discussed the risks benefits and complications of egd to include not limited to bleeding ingection perforation and sedation and she wishes to proceed.  Electronic Signatures: Loistine Simas (MD)  (Signed 29-Mar-16 18:04)  Authored: Brief Consult Note   Last Updated: 29-Mar-16 18:04 by Loistine Simas (MD)

## 2014-08-13 NOTE — Discharge Summary (Signed)
PATIENT NAME:  Linda Mejia, Linda Mejia MR#:  [id number] DATE OF BIRTH:  1955/10/03  DATE OF ADMISSION:  05/18/2014 DATE OF DISCHARGE:  05/18/2014  PRIMARY CARE PHYSICIAN: None local.   ADMITTING COMPLAINT: Chest pain.   DISCHARGE DIAGNOSES: 1. Chest pain with acute coronary syndrome ruled out.  2. Migraine.  3. Hypertension.  4. Coronary artery disease, status post myocardial infarction.  5. Chronic low back pain.  6. Ostial sinusitis.   CONSULTATIONS: Dr. Neoma Laming, cardiology.  PROCEDURES: Chest x-ray, February 4th, shows minimal atelectasis in the right lung base, otherwise clear lungs.  HISTORY OF PRESENT ILLNESS: This 59 year old Caucasian female presented to the Emergency Room via EMS complaining of chest pain. Her chest began hurting while she was cleaning her refrigerator. Pain was 10/10, radiating to the upper back and left shoulder. She developed tightness in the anterior central chest with shortness of breath. She was admitted for chest pain rule out.  1. Chest pain, noncardiac: The patient ruled out for acute coronary syndrome with nonspecific EKG changes and no elevation in cardiac enzymes. She was seen by cardiology, Dr. Humphrey Rolls, who felt that she was safe for discharge. She was started on Plavix due to history of percutaneous coronary intervention stenting and current chest pain. She was tachycardic and was started on metoprolol 50 mg b.i.d., as well as isosorbide 30 mg once a day. She is being discharged with an appointment for a stress Myoview at Dr. Laurelyn Sickle office on the day after discharge.  2. Migraine: The patient developed a migraine in the hospital, possibly due to nitrate administration. She was treated with Maxalt and Toradol, and her headache resolved within a few hours. She had no focal neurologic defects. She is discharged with a no further headache.  4. Hypertension: Her blood pressure was controlled and she was started on metoprolol and Imdur during this hospitalization.    DISCHARGE PHYSICAL EXAMINATION: VITAL SIGNS: Temperature 97.5, pulse 66, respirations 20, blood pressure 131/83, o2 sat 97% on room air.  GENERAL: No acute distress.  CARDIOVASCULAR: Regular rate and rhythm. No murmurs, rubs, or gallops. No peripheral edema. Peripheral pulses 2+.  RESPIRATORY: Lungs clear to auscultation bilaterally with good air movement.  ABDOMEN: Soft, nontender, nondistended. Bowel sounds are normal.  PSYCHIATRIC: The patient is alert and oriented x 4 with good insight into her clinical condition.   LABORATORY DATA: Sodium 140, potassium 3.7, chloride 103, bicarbonate 29, BUN 8, creatinine 0.75, glucose 99. LFTs are normal. Cardiac enzymes are negative x 3. White blood cells 8.2, hemoglobin 14.5, platelets 355,000. MCV is 95. Urinalysis is negative for signs of infection. EKG: Sinus tachycardia, nonspecific T-wave abnormalities.   DISCHARGE MEDICATIONS: 1. Adderall 30 mg 1 tablet twice a day.  2. Diazepam 10 mg 1 tablet 3 times a day.  3. Aspirin 81 mg 1 tablet once a day.  4. Metoprolol tartrate 25 mg 1 tablet twice a day.  5. Isosorbide mononitrate 30 mg 1 tablet once a day.  6. Atorvastatin 20 mg 1 tablet once a day.  7. Clopidogrel 75 mg 1 tablet once a day.  8. Maxalt 10 mg 1 tablet orally once a day if needed for migraines. If using  more than twice a week, she should discuss with her primary care physician.   CONDITION: Stable.   DISPOSITION: Discharged to home with no further home health needs.   DISCHARGE INSTRUCTIONS:  DIET: Heart healthy.   ACTIVITY: No restrictions.   TIMEFRAME FOR FOLLOWUP: Follow up tomorrow with Dr. Humphrey Rolls for  a stress Myoview. Follow up in 1-2 weeks with your primary care provider.   TIME SPENT ON DISCHARGE: 35 minutes.   ____________________________ Earleen Newport. Volanda Napoleon, MD cpw:mw D: 05/23/2014 12:46:01 ET T: 05/23/2014 13:45:51 ET JOB#: [id number]  cc: Barnetta Chapel P. Volanda Napoleon, MD, <Dictator> Aldean Jewett MD ELECTRONICALLY  SIGNED 05/24/2014 18:27

## 2014-08-13 NOTE — Consult Note (Signed)
PATIENT NAME:  Linda Mejia, Linda Mejia MR#:  [id number] DATE OF BIRTH:  Aug 30, 1955  DATE OF CONSULTATION:  05/18/2014  REFERRING PHYSICIAN:   CONSULTING PHYSICIAN:  Dionisio David, MD  INDICATION FOR CONSULTATION: Chest pain.   HISTORY OF PRESENT ILLNESS: This is a 59 year old white female with a past medical history of hyperlipidemia, hypertension, osteoarthritis, coronary artery disease and history of MI in the past who presented to the Emergency Room with chest pain. Chest pain was 10/10, associated with shortness of breath and diaphoresis. She described the pain as tightness in the chest. She was given nitroglycerin and that relieved the chest pain. She is no longer having any chest pain.  SOCIAL HISTORY: She is a former smoke. She will occasionally drinks.   FAMILY HISTORY: Her father and sibling died of myocardial infarction.   HOME MEDICATIONS: Aspirin 81 mg, metoprolol 25 mg.   ALLERGIES: None.   PHYSICAL EXAMINATION: GENERAL: She is alert and oriented x3, in no acute distress right now.  VITAL SIGNS: Blood pressure 154/87, respirations 20, pulse 93, temperature 98.2, saturation 96.  NECK: No JVD.  LUNGS: Clear.  HEART: Regular rate and rhythm. Normal S1, S2. No audible murmur.  ABDOMEN: Soft, nontender. Positive bowel sounds.  EXTREMITIES: No pedal edema.  NEUROLOGIC: She appears to be intact.   DIAGNOSTIC STUDIES: Her EKG shows sinus tachycardia at 115 beats per minute.   Two sets of cardiac enzymes are negative. CBC and MET-C unremarkable.   ASSESSMENT AND PLAN: The patient has history of myocardial infarction, history of PCI and stenting. Plavix was loaded and was started on Plavix. She continues to take aspirin. She is quite tachycardic. We will add metoprolol 50 b.i.d., also will add isosorbide 30 mg once a day and will set her up for a stress Myoview, since myocardial infarction has been ruled out, tomorrow in the office.   Thank you very much for the  referral.  ____________________________ Dionisio David, MD sak:sb D: 05/18/2014 08:41:00 ET T: 05/18/2014 08:57:56 ET JOB#: [id number]  cc: Dionisio David, MD, <Dictator> Dionisio David MD ELECTRONICALLY SIGNED 06/15/2014 12:15

## 2014-08-13 NOTE — Consult Note (Signed)
Chief Complaint:  Subjective/Chief Complaint patient seen for coffeeground emesis.  Please see EGD report.  Patietn with multiple GI in the antrum and incisura.  Recommend bid ppi, omeprazole 20 bid, no ASA or nsaids except 81 asa daily.  Will draw h pylori serology, will need 2 days full liquids, 5 days low residue, repeat egd in 7-8 weeks, o/p GI visit about 3-4 weeks from now to arrange.  Discussed with Dr Tressia Miners.   Electronic Signatures: Loistine Simas (MD)  (Signed 30-Mar-16 15:15)  Authored: Chief Complaint   Last Updated: 30-Mar-16 15:15 by Loistine Simas (MD)

## 2014-08-13 NOTE — Consult Note (Signed)
Chief Complaint:  Subjective/Chief Complaint seen for coffeeground emesis.  stable overnight.  no nausea emesis.  mild lower abdominal discomfort.   no bm   VITAL SIGNS/ANCILLARY NOTES: **Vital Signs.:   30-Mar-16 13:55  Vital Signs Type Routine  Temperature Temperature (F) 97.6  Celsius 36.4  Pulse Pulse 74  Respirations Respirations 20  Systolic BP Systolic BP 397  Diastolic BP (mmHg) Diastolic BP (mmHg) 75  Mean BP 98  Pulse Ox % Pulse Ox % 95  Pulse Ox Activity Level  At rest  Oxygen Delivery Room Air/ 21 %   Brief Assessment:  Cardiac Regular   Respiratory clear BS   Gastrointestinal details normal Soft  Nondistended  No masses palpable  Bowel sounds normal  No rebound tenderness  minimal lower discomfort to palpation.   Lab Results: Routine Chem:  30-Mar-16 01:45   BUN 10 (6-20 NOTE: New Reference Range  06/20/14)  Creatinine (comp) 0.58 (0.44-1.00 NOTE: New Reference Range  06/20/14)  Sodium, Serum 140 (135-145 NOTE: New Reference Range  06/20/14)  Potassium, Serum 3.8 (3.5-5.1 NOTE: New Reference Range  06/20/14)  Chloride, Serum 111 (101-111 NOTE: New Reference Range  06/20/14)  CO2, Serum 27 (22-32 NOTE: New Reference Range  06/20/14)  Calcium (Total), Serum  8.2 (8.9-10.3 NOTE: New Reference Range  06/20/14)  Anion Gap  2  eGFR (African American) >60  eGFR (Non-African American) >60 (eGFR values <28m/min/1.73 m2 may be an indication of chronic kidney disease (CKD). Calculated eGFR is useful in patients with stable renal function. The eGFR calculation will not be reliable in acutely ill patients when serum creatinine is changing rapidly. It is not useful in patients on dialysis. The eGFR calculation may not be applicable to patients at the low and high extremes of body sizes, pregnant women, and vegetarians.)  Routine Hem:  30-Mar-16 01:45   WBC (CBC) 5.2  RBC (CBC) 3.92  Hemoglobin (CBC) 12.3  Hematocrit (CBC) 37.4  Platelet Count (CBC)  302  MCV 95  MCH 31.5  MCHC 33.1  RDW 13.5  Neutrophil % 40.1  Lymphocyte % 42.8  Monocyte % 12.7  Eosinophil % 3.4  Basophil % 1.0  Neutrophil # 2.1  Lymphocyte # 2.2  Monocyte # 0.7  Eosinophil # 0.2  Basophil # 0.1 (Result(s) reported on 12 Jul 2014 at 01:59AM.)   Assessment/Plan:  Assessment/Plan:  Assessment 1) coffeeground emesis -not recurrent.  hemodynamically stable, stable labs. history of daily asa/goodie powder use.  2) recent enteritis? with diarrhea, resolved   Plan 1) egd today.  I have discussed the risks benefits and complicatiions of ed to include not limited to bleeding infection perforations dn sedation and she wishes to proceed. further recs to follow.   Electronic Signatures: SLoistine Simas(MD)  (Signed 30-Mar-16 14:26)  Authored: Chief Complaint, VITAL SIGNS/ANCILLARY NOTES, Brief Assessment, Lab Results, Assessment/Plan   Last Updated: 30-Mar-16 14:26 by SLoistine Simas(MD)

## 2014-08-13 NOTE — H&P (Signed)
PATIENT NAME:  Linda Mejia, Linda Mejia MR#:  [id number] DATE OF BIRTH:  [birth year]  DATE OF ADMISSION:  07/11/2014  ADMITTING PHYSICIAN: Gladstone Lighter, MD    PRIMARY CARE PHYSICIAN: Venetia Maxon. Elijio Miles, MD  PRIMARY CARDIOLOGIST: Dionisio David, MD  CHIEF COMPLAINT: Epigastric pain and hematemesis.   HISTORY OF PRESENT ILLNESS: Linda Mejia is a 59 year old pleasant Caucasian female with a past medical history significant for coronary artery disease, status post stent placement in 1999 and a second one about 5 years ago, hypertension, chronic low back pain, attention deficit disorder, osteoarthritis, who presents from Dr. Bailey Mech office for above-mentioned complaint. The patient has been in her normal state of health up until 4 days ago when she experienced intense epigastric pain without any nausea, vomiting. She did have diarrhea that day and denies any melena or bloody stools. She did okay for the next couple of days but since yesterday she has epigastric pain again and she threw up coffee-ground emesis at least 4 to 5 times yesterday. Denies any diarrhea, fever or chills. No recent travel noted. The patient did have history of prior colonoscopy which according to the patient was normal. She had surgery for intussusception and adhesiolysis in 2011, but has not had any issues after that. Never had any upper GI endoscopy done.   PAST MEDICAL HISTORY: 1.  Coronary artery disease status post stents.  2.  Hypertension.  3.  Chronic low back pain.  4.  Osteoarthritis.  5.  Attention deficit disorder.  6.  Panic attacks.   PAST SURGICAL HISTORY:  1.  C-sections.  2.  Appendectomy.  3.  Salpingectomy for ectopic pregnancy.   4.  Adhesiolysis for intussusception.  5.  Tonsillectomy.   ALLERGIES: No known drug allergies.   CURRENT HOME MEDICATIONS:  1.  Adderall 30 mg p.o. b.i.d.  2.  Aspirin 81 mg p.o. daily.  3.  Diazepam 10 mg p.o. t.i.d.  4.  Plavix 75 mg p.o. daily.  5.  Ambien 10 mg p.o. at  bedtime.  6.  Atorvastatin 20 mg p.o. daily.  7.  Imdur 30 mg p.o. daily.   SOCIAL HISTORY: The patient lives at home with her daughter, very stable and independent. No use of any cane or any walker at baseline. Quit smoking about 9 years ago. Occasional alcohol use.   FAMILY HISTORY: Significant for heart disease in the family. Mother deceased from CVA.   REVIEW OF SYSTEMS: CONSTITUTIONAL: No fever, fatigue, or weakness.  EYES: No blurred vision, double vision, inflammation or glaucoma.  ENT: No tinnitus, ear pain, hearing loss, epistaxis or discharge.  RESPIRATORY: No cough, wheeze, hemoptysis, or COPD.   CARDIOVASCULAR: No chest pain, orthopnea, edema, arrhythmia, palpitations, or syncope.  GASTROINTESTINAL: Positive for nausea, vomiting, hematemesis, and epigastric abdominal pain. No diarrhea, constipation, reflux disease.  GENITOURINARY: No dysuria, hematuria, renal calculus, frequency, or incontinence.  ENDOCRINE: No polyuria, nocturia, thyroid problems, heat or cold intolerance.  HEMATOLOGY: No anemia, easy bruising or bleeding.  SKIN: No acne, rash or lesions.  MUSCULOSKELETAL: No neck, back, shoulder pain, arthritis or gout.  NEUROLOGICAL: No numbness, weakness, CVA, TIA, or seizures.  PSYCHOLOGICAL: No anxiety, insomnia, or depression.   PHYSICAL EXAMINATION: VITAL SIGNS: Temperature 97.8 degrees Fahrenheit,  pulse 79, respirations 20, blood pressure 176/94, pulse oximetry 91% on room air.  GENERAL: Well-built, well-nourished female sitting in bed, not in any acute distress.  HEENT: Normocephalic, atraumatic. Pupils equal, round, reacting to light. Anicteric sclerae. Extraocular movements intact. Oropharynx clear without erythema, mass,  or exudates.  NECK: Supple. No thyromegaly, JVD or carotid bruits. No lymphadenopathy.  LUNGS: Moving air bilaterally. No wheeze or crackles. No use of accessory muscles for breathing.  CARDIOVASCULAR: S1, S2, regular rate and rhythm. No  murmurs, rubs, or gallops.  ABDOMEN: Mild discomfort in the epigastric region on palpation. Tenderness present. No rebound tenderness or guarding or rigidity noted. No hepatosplenomegaly. Normal bowel sounds.  EXTREMITIES: No pedal edema. No clubbing or cyanosis, 2+ dorsalis pedis pulses palpable bilaterally.  SKIN: No acne, rash or lesions.  LYMPHATICS: No cervical lymphadenopathy.  NEUROLOGIC: Cranial nerves intact. No focal motor or sensory deficits.  PSYCHOLOGICAL: The patient is awake, alert, oriented x 3.   LABORATORY DATA: WBC 6.7, hemoglobin 14.1, hematocrit 43.8, platelet count 362,000, MCV 96. Her other laboratory results are pending at this time.   ASSESSMENT AND PLAN: A 59 year old female with history of coronary artery disease, status post stent, hypertension, attention deficit disorder, panic attacks, osteoarthritis, admitted from primary care physician's office for hematemesis.  1.  Hematemesis with epigastric pain, gastritis. Could be viral likely. No previous abdominal problems. Hold aspirin and Plavix for now. Gastroenterology consulted. Liquid diet. Hemoglobin check every 6 hours. Admit her for observation. IV Protonix for now.   2.  Coronary artery disease, status post stent. Last stent about 5 years ago. Recent cardiac catheterization 3 weeks ago with no acute disease. Hold aspirin and Plavix for now. Continue Imdur, metoprolol, and statin.  3.  Hypertension, on Imdur and metoprolol.  4.  Attention deficit disorder. Continue her Adderall.   5.  Deep vein thrombosis prophylaxis. Placed on TEDs and SCDs. The patient is ambulatory at this time.   CODE STATUS: Full code.   TIME SPENT ON ADMISSION: 50 minutes.    ____________________________ Gladstone Lighter, MD rk:at D: 07/11/2014 14:32:39 ET T: 07/11/2014 15:07:51 ET JOB#: [id number]  cc: Gladstone Lighter, MD, <Dictator> Sheikh A. Elijio Miles, MD Gladstone Lighter MD ELECTRONICALLY SIGNED 07/20/2014 12:24

## 2014-08-26 ENCOUNTER — Emergency Department: Payer: Medicare Other

## 2014-08-26 ENCOUNTER — Emergency Department
Admission: EM | Admit: 2014-08-26 | Discharge: 2014-08-27 | Disposition: A | Discharge status: Home / Self Care | Payer: Medicare Other | Attending: Emergency Medicine | Admitting: Emergency Medicine

## 2014-08-26 ENCOUNTER — Other Ambulatory Visit: Payer: Self-pay

## 2014-08-26 ENCOUNTER — Encounter: Payer: Self-pay | Admitting: Radiology

## 2014-08-26 DIAGNOSIS — Z87891 Personal history of nicotine dependence: Secondary | ICD-10-CM | POA: Insufficient documentation

## 2014-08-26 DIAGNOSIS — Z7901 Long term (current) use of anticoagulants: Secondary | ICD-10-CM | POA: Insufficient documentation

## 2014-08-26 DIAGNOSIS — I10 Essential (primary) hypertension: Secondary | ICD-10-CM | POA: Insufficient documentation

## 2014-08-26 DIAGNOSIS — R0602 Shortness of breath: Secondary | ICD-10-CM | POA: Insufficient documentation

## 2014-08-26 DIAGNOSIS — R0781 Pleurodynia: Secondary | ICD-10-CM | POA: Diagnosis not present

## 2014-08-26 DIAGNOSIS — Z79899 Other long term (current) drug therapy: Secondary | ICD-10-CM | POA: Diagnosis not present

## 2014-08-26 DIAGNOSIS — Z7982 Long term (current) use of aspirin: Secondary | ICD-10-CM | POA: Diagnosis not present

## 2014-08-26 DIAGNOSIS — R079 Chest pain, unspecified: Secondary | ICD-10-CM | POA: Diagnosis present

## 2014-08-26 LAB — CBC
HCT: 37.1 % (ref 35.0–47.0)
Hemoglobin: 12.2 g/dL (ref 12.0–16.0)
MCH: 31 pg (ref 26.0–34.0)
MCHC: 33 g/dL (ref 32.0–36.0)
MCV: 94.2 fL (ref 80.0–100.0)
Platelets: 345 10*3/uL (ref 150–440)
RBC: 3.94 MIL/uL (ref 3.80–5.20)
RDW: 13.2 % (ref 11.5–14.5)
WBC: 6.8 10*3/uL (ref 3.6–11.0)

## 2014-08-26 LAB — COMPREHENSIVE METABOLIC PANEL
ALT: 13 U/L — ABNORMAL LOW (ref 14–54)
AST: 24 U/L (ref 15–41)
Albumin: 4 g/dL (ref 3.5–5.0)
Alkaline Phosphatase: 67 U/L (ref 38–126)
Anion gap: 12 (ref 5–15)
BUN: 20 mg/dL (ref 6–20)
CO2: 23 mmol/L (ref 22–32)
Calcium: 9 mg/dL (ref 8.9–10.3)
Chloride: 104 mmol/L (ref 101–111)
Creatinine, Ser: 0.87 mg/dL (ref 0.44–1.00)
GFR calc Af Amer: >60 mL/min (ref >60)
GFR calc non Af Amer: >60 mL/min (ref >60)
Glucose, Bld: 117 mg/dL — ABNORMAL HIGH (ref 65–99)
Potassium: 3.6 mmol/L (ref 3.5–5.1)
Sodium: 139 mmol/L (ref 135–145)
Total Bilirubin: 0.3 mg/dL (ref 0.3–1.2)
Total Protein: 7.4 g/dL (ref 6.5–8.1)

## 2014-08-26 LAB — FIBRIN DERIVATIVES D-DIMER (ARMC ONLY): Fibrin derivatives D-dimer (ARMC): 678 — ABNORMAL HIGH (ref 0–499)

## 2014-08-26 LAB — TROPONIN I: Troponin I: <0.03 ng/mL (ref <0.031)

## 2014-08-26 MED ORDER — DIAZEPAM 5 MG PO TABS
10.0000 mg | ORAL_TABLET | Freq: Once | ORAL | Status: AC
  Administered 2014-08-26: 10 mg via ORAL

## 2014-08-26 MED ORDER — KETOROLAC TROMETHAMINE 30 MG/ML IJ SOLN: 30.0000 mg | Freq: Once | INTRAMUSCULAR | Status: DC

## 2014-08-26 MED ORDER — KETOROLAC TROMETHAMINE 60 MG/2ML IM SOLN
INTRAMUSCULAR | Status: AC
  Administered 2014-08-26: 60 mg via INTRAMUSCULAR
  Filled 2014-08-26: qty 2

## 2014-08-26 MED ORDER — DIAZEPAM 5 MG PO TABS: 5.0000 mg | ORAL_TABLET | Freq: Three times a day (TID) | ORAL | Status: DC | PRN

## 2014-08-26 MED ORDER — KETOROLAC TROMETHAMINE 30 MG/ML IJ SOLN
INTRAMUSCULAR | Status: AC
  Filled 2014-08-26: qty 1

## 2014-08-26 MED ORDER — DIAZEPAM 5 MG PO TABS
ORAL_TABLET | ORAL | Status: AC
  Administered 2014-08-26: 10 mg via ORAL
  Filled 2014-08-26: qty 2

## 2014-08-26 MED ORDER — IBUPROFEN 800 MG PO TABS: 800.0000 mg | ORAL_TABLET | Freq: Three times a day (TID) | ORAL | Status: DC | PRN

## 2014-08-26 MED ORDER — IOHEXOL 350 MG/ML SOLN
75.0000 mL | Freq: Once | INTRAVENOUS | Status: AC | PRN
  Administered 2014-08-26: 75 mL via INTRAVENOUS

## 2014-08-26 MED ORDER — KETOROLAC TROMETHAMINE 60 MG/2ML IM SOLN
60.0000 mg | Freq: Once | INTRAMUSCULAR | Status: AC
  Administered 2014-08-26: 60 mg via INTRAMUSCULAR

## 2014-08-26 NOTE — ED Notes (Signed)
Pt presents to ER alert and in NAD. Pt reports left side chets pain, worsens with deep breath. Resp even and unlabored.

## 2014-08-26 NOTE — Discharge Instructions (Signed)
Pleurodynia °Pleurodynia is a sharp pain in the muscles between your ribs (intercostal muscles). This condition makes it painful to breathe. Pleurodynia is sometimes described as an iron grip around the rib cage. Pleurodynia attacks are unpredictable. °CAUSES  °Pleurodynia is commonly caused by a viral infection. A virus, called coxsackievirus B, attacks the intercostal muscles. However, getting pleurodynia from this virus is rare. Most people infected with coxsackievirus B have no symptoms. In some people, the virus causes a mild sore throat, cough, or diarrhea. Coxsackievirus B can live in body fluids, such as saliva and mucus. It is easily spread from person to person through coughing or sneezing. Coming in contact with the stool of an infected person can also spread the virus. °SYMPTOMS  °Symptoms usually start 3 to 6 days after you have been infected with the virus. Very bad chest pain is the main symptom of pleurodynia. The pain is usually felt on one side of the body, along the lower ribs. It starts suddenly and may last from a few seconds to 1 minute. It is hard to breathe when the pain strikes. You might feel pain again a few minutes or hours later. In most cases, the pain keeps coming back for 3 to 5 days. Then it goes away. In some cases, the pain keeps coming back every so often for up to 1 month. °Other symptoms of pleurodynia may include:  °· Fever. °· Rapid heartbeat. °· Sore throat. °· Cough. °· Headache. °· Stomach pain. °· Nausea. °· Vomiting. °· Diarrhea. °· Feeling very tired. °· Rash. °· For males, pain in the testicles. °DIAGNOSIS  °If you have had very bad chest pain, your caregiver will probably order some tests to determine whether you have pleurodynia. These tests may include: °· A throat swab. Your caregiver may rub the back of your throat with a cotton swab. The cotton swab can then be tested for coxsackievirus B. °· Urine and stool samples. These samples will be tested for coxsackievirus  B. °· Blood tests. These tests can tell if you have muscle damage. °· Chest X-rays. °· Electrocardiography (ECG). This test checks your heartbeat. °TREATMENT  °There is no treatment for an infection caused by coxsackievirus B. However, pleurodynia usually goes away on its own. It may take up to 1 month to fully recover. You may be given nonsteroidal anti-inflammatory drugs (NSAIDs) to control your pain. If your chest pain continues, you may need to see a pain specialist to discuss possibly using nerve block injections to relieve your pain. °HOME CARE INSTRUCTIONS °· Only take over-the-counter or prescription medicines for pain, fever, or discomfort as directed by your caregiver. °· Return to your regular activities slowly. °· Wash your hands often. This helps prevent coxsackievirus B from spreading. °· Do not smoke. °· Keep all follow-up appointments as directed by your caregiver. °SEEK MEDICAL CARE IF: °· You have new symptoms. °· Your symptoms are getting worse. °· You develop a cough. °· You have a sore throat. °· You have a rash. °· You have abdominal pain. °· You vomit. °· You have diarrhea. °SEEK IMMEDIATE MEDICAL CARE IF: °· You have very bad chest pain that is getting worse. °· You have trouble breathing. °· You have a fever. °MAKE SURE YOU: °· Understand these instructions. °· Will watch your condition. °· Will get help right away if you are not doing well or get worse. °Document Released: 03/20/2011 Document Revised: 06/23/2011 Document Reviewed: 03/20/2011 °[employer]® Patient Information ©2015 [employer]. This information is not   intended to replace advice given to you by your health care provider. Make sure you discuss any questions you have with your health care provider. ° °

## 2014-08-26 NOTE — ED Notes (Signed)
Pt medicated with toradol im for left rib pain.

## 2014-08-26 NOTE — ED Provider Notes (Signed)
[facility] Emergency Department Provider Note     Time seen: 9:28 PM  I have reviewed the triage vital signs and the nursing notes.   HISTORY  Chief Complaint Chest Pain and Shortness of Breath    HPI Linda Mejia is a 59 y.o. female who presents to ER for less chest pain left sided rib pain, and pain when she takes deep breaths or moves. Patient states she has a history of same when she broke several ribs 4 years ago and has intermittent pain since that period time, has recently been dealing with ulcers. Currently pending severe sharp intermittent movement deep breath makes it worse.    Past Medical History  Diagnosis Date  . Anxiety   . Coronary artery disease   . Hypertension   . ADHD (attention deficit hyperactivity disorder)   . Chronic back pain     Patient Active Problem List   Diagnosis Date Noted  . HYPERLIPIDEMIA-MIXED 10/03/2009  . HYPERTENSION, UNSPECIFIED 10/02/2009  . CAD, NATIVE VESSEL 10/02/2009    Past Surgical History  Procedure Laterality Date  . Cesarean section    . Tubal ligation    . Cardiac catheterization    . Tonsillectomy      Current Outpatient Rx  Name  Route  Sig  Dispense  Refill  . amphetamine-dextroamphetamine (ADDERALL) 12.5 MG tablet   Oral   Take 25 mg by mouth 2 (two) times daily.           Marland Kitchen aspirin 325 MG tablet   Oral   Take 325 mg by mouth daily.           . butalbital-acetaminophen-caffeine (FIORICET WITH CODEINE) 50-325-40-30 MG per capsule   Oral   Take 1 capsule by mouth every 4 (four) hours as needed.           . clopidogrel (PLAVIX) 75 MG tablet   Oral   Take 75 mg by mouth daily.           . cyclobenzaprine (FLEXERIL) 10 MG tablet   Oral   Take 10 mg by mouth 3 (three) times daily as needed.           . diazepam (VALIUM) 10 MG tablet   Oral   Take 10 mg by mouth every 6 (six) hours as needed.           . DULoxetine (CYMBALTA) 30 MG capsule   Oral   Take 90 mg by  mouth daily.           . metoprolol (LOPRESSOR) 50 MG tablet   Oral   Take 1 tablet (50 mg total) by mouth 2 (two) times daily.   60 tablet   1   . nitroGLYCERIN (NITROSTAT) 0.4 MG SL tablet   Sublingual   Place 0.4 mg under the tongue every 5 (five) minutes as needed.           . rosuvastatin (CRESTOR) 20 MG tablet   Oral   Take 20 mg by mouth daily.           Marland Kitchen zolpidem (AMBIEN) 10 MG tablet   Oral   Take 10 mg by mouth at bedtime as needed.             Allergies Review of patient's allergies indicates no known allergies.  Family History  Problem Relation Age of Onset  . Diabetes Mother   . Heart disease Father     Social History History  Substance  Use Topics  . Smoking status: Former Smoker -- 1.00 packs/day for 30 years  . Smokeless tobacco: Not on file  . Alcohol Use: No    Review of Systems Constitutional: Negative for fever. Eyes: Negative for visual changes. ENT: Negative for sore throat. Cardiovascular: Positive for left-sided chest pain Respiratory: Pain with breathing. Gastrointestinal: Negative for abdominal pain, vomiting and diarrhea. Genitourinary: Negative for dysuria. Musculoskeletal: Negative for back pain. Skin: Negative for rash. Neurological: Negative for headaches, focal weakness or numbness.  10-point ROS otherwise negative.  ____________________________________________   PHYSICAL EXAM:  VITAL SIGNS: ED Triage Vitals  Enc Vitals Group     BP 08/26/14 2055 150/88 mmHg     Pulse Rate 08/26/14 2055 109     Resp 08/26/14 2055 20     Temp 08/26/14 2055 98.6 F (37 C)     Temp Source 08/26/14 2055 Oral     SpO2 08/26/14 2055 97 %     Weight 08/26/14 2055 160 lb (72.576 kg)     Height 08/26/14 2055 5\' 6"  (1.676 m)     Head Cir --      Peak Flow --      Pain Score 08/26/14 2056 10     Pain Loc --      Pain Edu? --      Excl. in [place]? --     Constitutional: Alert and oriented. Mild distress Eyes: Conjunctivae are  normal. PERRL. Normal extraocular movements. ENT   Head: Normocephalic and atraumatic.   Nose: No congestion/rhinnorhea.   Mouth/Throat: Mucous membranes are moist.   Neck: No stridor. Hematological/Lymphatic/Immunilogical: No cervical lymphadenopathy. Cardiovascular: Normal rate, regular rhythm. Normal and symmetric distal pulses are present in all extremities. No murmurs, rubs, or gallops. Respiratory: Normal respiratory effort without tachypnea nor retractions. Breath sounds are clear and equal bilaterally. No wheezes/rales/rhonchi. Splinting Gastrointestinal: Soft and nontender. No distention. No abdominal bruits. There is no CVA tenderness. Musculoskeletal: Nontender with normal range of motion in all extremities. No joint effusions.  No lower extremity tenderness nor edema. Neurologic:  Normal speech and language. No gross focal neurologic deficits are appreciated. Speech is normal. No gait instability. Skin:  Skin is warm, dry and intact. No rash noted. Psychiatric: Mood and affect are normal. Speech and behavior are normal. Patient exhibits appropriate insight and judgment.  ____________________________________________    LABS (pertinent positives/negatives)  Labs Reviewed  COMPREHENSIVE METABOLIC PANEL - Abnormal; Notable for the following:    Glucose, Bld 117 (*)    ALT 13 (*)    All other components within normal limits  FIBRIN DERIVATIVES D-DIMER [facility]) - Abnormal; Notable for the following:    Fibrin derivatives D-dimer ([facility]) 678 (*)    All other components within normal limits  CBC  TROPONIN I    ____________________________________________  ED COURSE:  Pertinent labs & imaging results that were available during my care of the patient were reviewed by me and considered in my medical decision making (see chart for details).  D-dimer elevated, will order CT chest ____________________________________________   RADIOLOGY  Chest x-ray  unremarkable  CTA chest - IMPRESSION: 1. No evidence of pulmonary embolus. 2. Minimal bibasilar atelectasis noted. Lungs otherwise clear. 3. Scattered coronary artery calcification noted.   FINAL ASSESSMENT AND PLAN  Pleuritic chest pain CT is negative, will discharge with Motrin, Valium and close follow-up with her primary care doctor.   Earleen Newport, MD   Earleen Newport, MD 08/26/14 [phone]

## 2014-08-27 NOTE — ED Notes (Signed)
Pt with ED tech Alissa to discharge pt; pt very upset; will not rate pain upon discharge as she says "it must be all in my head"; not happy with MD that saw her and his discharge instructions or prescriptions; pt has refused to e-sign her chart; daughter also expressing anger saying "this hospital is a joke" and "we're going to [facility]"

## 2014-08-28 DIAGNOSIS — R0681 Apnea, not elsewhere classified: Secondary | ICD-10-CM | POA: Insufficient documentation

## 2014-09-20 DIAGNOSIS — Z8719 Personal history of other diseases of the digestive system: Secondary | ICD-10-CM | POA: Insufficient documentation

## 2014-09-20 DIAGNOSIS — F1910 Other psychoactive substance abuse, uncomplicated: Secondary | ICD-10-CM | POA: Insufficient documentation

## 2014-09-20 DIAGNOSIS — Z87898 Personal history of other specified conditions: Secondary | ICD-10-CM

## 2014-09-20 DIAGNOSIS — Z8711 Personal history of peptic ulcer disease: Secondary | ICD-10-CM | POA: Insufficient documentation

## 2014-09-20 DIAGNOSIS — F900 Attention-deficit hyperactivity disorder, predominantly inattentive type: Secondary | ICD-10-CM | POA: Insufficient documentation

## 2014-09-20 DIAGNOSIS — F410 Panic disorder [episodic paroxysmal anxiety] without agoraphobia: Secondary | ICD-10-CM | POA: Insufficient documentation

## 2014-09-21 DIAGNOSIS — M542 Cervicalgia: Secondary | ICD-10-CM

## 2015-03-01 ENCOUNTER — Emergency Department: Payer: Medicare Other

## 2015-03-01 ENCOUNTER — Emergency Department
Admission: EM | Admit: 2015-03-01 | Discharge: 2015-03-02 | Disposition: A | Discharge status: Home / Self Care | Payer: Medicare Other | Attending: Emergency Medicine | Admitting: Emergency Medicine

## 2015-03-01 DIAGNOSIS — R079 Chest pain, unspecified: Secondary | ICD-10-CM | POA: Diagnosis not present

## 2015-03-01 DIAGNOSIS — Z7982 Long term (current) use of aspirin: Secondary | ICD-10-CM | POA: Insufficient documentation

## 2015-03-01 DIAGNOSIS — I10 Essential (primary) hypertension: Secondary | ICD-10-CM | POA: Insufficient documentation

## 2015-03-01 DIAGNOSIS — Z87891 Personal history of nicotine dependence: Secondary | ICD-10-CM | POA: Diagnosis not present

## 2015-03-01 DIAGNOSIS — Z79899 Other long term (current) drug therapy: Secondary | ICD-10-CM | POA: Diagnosis not present

## 2015-03-01 HISTORY — DX: Personal history of peptic ulcer disease: Z87.11

## 2015-03-01 HISTORY — DX: Personal history of other diseases of the digestive system: Z87.19

## 2015-03-01 LAB — COMPREHENSIVE METABOLIC PANEL
ALT: 14 U/L (ref 14–54)
AST: 23 U/L (ref 15–41)
Albumin: 3.7 g/dL (ref 3.5–5.0)
Alkaline Phosphatase: 59 U/L (ref 38–126)
Anion gap: 7 (ref 5–15)
BUN: 13 mg/dL (ref 6–20)
CO2: 26 mmol/L (ref 22–32)
Calcium: 8.3 mg/dL — ABNORMAL LOW (ref 8.9–10.3)
Chloride: 107 mmol/L (ref 101–111)
Creatinine, Ser: 0.61 mg/dL (ref 0.44–1.00)
GFR calc Af Amer: >60 mL/min (ref >60)
GFR calc non Af Amer: >60 mL/min (ref >60)
Glucose, Bld: 76 mg/dL (ref 65–99)
Potassium: 3.7 mmol/L (ref 3.5–5.1)
Sodium: 140 mmol/L (ref 135–145)
Total Bilirubin: 0.3 mg/dL (ref 0.3–1.2)
Total Protein: 7 g/dL (ref 6.5–8.1)

## 2015-03-01 LAB — CBC WITH DIFFERENTIAL/PLATELET
Basophils Absolute: 0.1 10*3/uL (ref 0–0.1)
Basophils Relative: 1 %
Eosinophils Absolute: 0.2 10*3/uL (ref 0–0.7)
Eosinophils Relative: 3 %
HCT: 36.1 % (ref 35.0–47.0)
Hemoglobin: 12.2 g/dL (ref 12.0–16.0)
Lymphocytes Relative: 37 %
Lymphs Abs: 3 10*3/uL (ref 1.0–3.6)
MCH: 31.3 pg (ref 26.0–34.0)
MCHC: 33.8 g/dL (ref 32.0–36.0)
MCV: 92.6 fL (ref 80.0–100.0)
Monocytes Absolute: 1 10*3/uL — ABNORMAL HIGH (ref 0.2–0.9)
Monocytes Relative: 13 %
Neutro Abs: 3.8 10*3/uL (ref 1.4–6.5)
Neutrophils Relative %: 46 %
Platelets: 348 10*3/uL (ref 150–440)
RBC: 3.9 MIL/uL (ref 3.80–5.20)
RDW: 13.9 % (ref 11.5–14.5)
WBC: 8.1 10*3/uL (ref 3.6–11.0)

## 2015-03-01 LAB — LIPASE, BLOOD: Lipase: 38 U/L (ref 11–51)

## 2015-03-01 LAB — TROPONIN I
Troponin I: <0.03 ng/mL (ref <0.031)
Troponin I: <0.03 ng/mL (ref <0.031)

## 2015-03-01 MED ORDER — MORPHINE SULFATE (PF) 4 MG/ML IV SOLN
4.0000 mg | Freq: Once | INTRAVENOUS | Status: AC
  Administered 2015-03-01: 4 mg via INTRAVENOUS

## 2015-03-01 MED ORDER — NITROGLYCERIN 0.4 MG SL SUBL
0.4000 mg | SUBLINGUAL_TABLET | SUBLINGUAL | Status: DC | PRN
  Administered 2015-03-01: 0.4 mg via SUBLINGUAL

## 2015-03-01 MED ORDER — MORPHINE SULFATE (PF) 4 MG/ML IV SOLN
4.0000 mg | Freq: Once | INTRAVENOUS | Status: AC
  Administered 2015-03-01: 4 mg via INTRAVENOUS
  Filled 2015-03-01: qty 1

## 2015-03-01 MED ORDER — GI COCKTAIL ~~LOC~~
30.0000 mL | ORAL | Status: DC
  Filled 2015-03-01: qty 30

## 2015-03-01 MED ORDER — NITROGLYCERIN 0.4 MG SL SUBL
SUBLINGUAL_TABLET | SUBLINGUAL | Status: AC
  Administered 2015-03-01: 0.4 mg via SUBLINGUAL
  Filled 2015-03-01: qty 1

## 2015-03-01 MED ORDER — FAMOTIDINE IN NACL 20-0.9 MG/50ML-% IV SOLN
20.0000 mg | Freq: Once | INTRAVENOUS | Status: AC
  Administered 2015-03-01: 20 mg via INTRAVENOUS
  Filled 2015-03-01: qty 50

## 2015-03-01 MED ORDER — MORPHINE SULFATE (PF) 4 MG/ML IV SOLN
INTRAVENOUS | Status: AC
Start: 2015-03-01 — End: 2015-03-01
  Administered 2015-03-01: 4 mg via INTRAVENOUS
  Filled 2015-03-01: qty 1

## 2015-03-02 ENCOUNTER — Encounter: Payer: Self-pay | Admitting: *Deleted

## 2015-03-02 NOTE — ED Notes (Signed)
Pt arrived via EMS from home reporting centralized chest pain that began as a stabbing pain at 1730 this evening. Pt reports pain is now a dull heaviness in the center of her chest radiating to her back. Pt deneis SOB, NVD, or diaphoresis at this time. Pt has hx of MI and reports this pain is similar to past pain. EMS administered 324 ASA and 0.4 Nitro with no relief. Pt is alert and oriented x 4

## 2015-03-02 NOTE — ED Provider Notes (Signed)
[facility] Emergency Department Provider Note  ____________________________________________  Time seen: 7:30 PM  I have reviewed the triage vital signs and the nursing notes.   HISTORY  Chief Complaint Chest Pain    HPI Linda Mejia is a 59 y.o. female who complains of chest pain. The pain as central, aching and dull, nonexertional, nonpleuritic, not radiating or associated with any nausea vomiting shortness of breath or diaphoresis. Not positional were worse with movement. No aggravating or alleviating factors. Moderate in intensity. She had one episode of this pain this morning which lasted for about 2 minutes and then resolve spontaneously, and then a few hours later she had another episode which is ongoing until now.     Past Medical History  Diagnosis Date  . Anxiety   . Coronary artery disease   . Hypertension   . ADHD (attention deficit hyperactivity disorder)   . Chronic back pain      Patient Active Problem List   Diagnosis Date Noted  . HYPERLIPIDEMIA-MIXED 10/03/2009  . HYPERTENSION, UNSPECIFIED 10/02/2009  . CAD, NATIVE VESSEL 10/02/2009     Past Surgical History  Procedure Laterality Date  . Cesarean section    . Tubal ligation    . Cardiac catheterization    . Tonsillectomy       Current Outpatient Rx  Name  Route  Sig  Dispense  Refill  . amphetamine-dextroamphetamine (ADDERALL) 30 MG tablet   Oral   Take 30 mg by mouth 2 (two) times daily.         Marland Kitchen aspirin EC 81 MG tablet   Oral   Take 81 mg by mouth daily.         . diazepam (VALIUM) 5 MG tablet   Oral   Take 1 tablet (5 mg total) by mouth every 8 (eight) hours as needed for anxiety.   30 tablet   0   . metoprolol (LOPRESSOR) 50 MG tablet   Oral   Take 1 tablet (50 mg total) by mouth 2 (two) times daily.   60 tablet   1   . omeprazole (PRILOSEC) 20 MG capsule   Oral   Take 20 mg by mouth 2 (two) times daily before a meal.         . rosuvastatin  (CRESTOR) 20 MG tablet   Oral   Take 20 mg by mouth daily.           Marland Kitchen zolpidem (AMBIEN) 10 MG tablet   Oral   Take 10 mg by mouth at bedtime as needed.           Marland Kitchen ibuprofen (ADVIL,MOTRIN) 800 MG tablet   Oral   Take 1 tablet (800 mg total) by mouth every 8 (eight) hours as needed. Patient not taking: Reported on 03/01/2015   30 tablet   0      Allergies Review of patient's allergies indicates no known allergies.   Family History  Problem Relation Age of Onset  . Diabetes Mother   . Heart disease Father     Social History Social History  Substance Use Topics  . Smoking status: Former Smoker -- 1.00 packs/day for 30 years  . Smokeless tobacco: Not on file  . Alcohol Use: No    Review of Systems  Constitutional:   No fever or chills. No weight changes Eyes:   No blurry vision or double vision.  ENT:   No sore throat. Cardiovascular:   Positive as above chest pain. Respiratory:  No dyspnea or cough. Gastrointestinal:   Negative for abdominal pain, vomiting and diarrhea.  No BRBPR or melena. Genitourinary:   Negative for dysuria, urinary retention, bloody urine, or difficulty urinating. Musculoskeletal:   Negative for back pain. No joint swelling or pain. Skin:   Negative for rash. Neurological:   Negative for headaches, focal weakness or numbness. No dizziness or syncope Psychiatric:  No anxiety or depression.   Endocrine:  No hot/cold intolerance, changes in energy, or sleep difficulty.  10-point ROS otherwise negative.  ____________________________________________   PHYSICAL EXAM:  VITAL SIGNS: ED Triage Vitals  Enc Vitals Group     BP 03/01/15 2305 131/113 mmHg     Pulse Rate 03/01/15 2100 71     Resp 03/01/15 2100 12     Temp --      Temp src --      SpO2 03/01/15 2100 96 %     Weight --      Height --      Head Cir --      Peak Flow --      Pain Score 03/01/15 2306 5     Pain Loc --      Pain Edu? --      Excl. in [place]? --       Constitutional:   Alert and oriented. Well appearing and in no distress. Calm and comfortable Eyes:   No scleral icterus. No conjunctival pallor. PERRL. EOMI ENT   Head:   Normocephalic and atraumatic.   Nose:   No congestion/rhinnorhea. No septal hematoma   Mouth/Throat:   MMM, no pharyngeal erythema. No peritonsillar mass. No uvula shift.   Neck:   No stridor. No SubQ emphysema. No meningismus. Hematological/Lymphatic/Immunilogical:   No cervical lymphadenopathy. Cardiovascular:   RRR. Normal and symmetric distal pulses are present in all extremities. No murmurs, rubs, or gallops. Respiratory:   Normal respiratory effort without tachypnea nor retractions. Breath sounds are clear and equal bilaterally. No wheezes/rales/rhonchi. Gastrointestinal:   Soft and nontender. No distention. There is no CVA tenderness.  No rebound, rigidity, or guarding. Genitourinary:   deferred Musculoskeletal:   Nontender with normal range of motion in all extremities. No joint effusions.  No lower extremity tenderness.  No edema. Chest wall nontender. No pulsatile mass or bruit Neurologic:   Normal speech and language.  CN 2-10 normal. Motor grossly intact. No pronator drift.  Normal gait. No gross focal neurologic deficits are appreciated.  Skin:    Skin is warm, dry and intact. No rash noted.  No petechiae, purpura, or bullae. Psychiatric:   Mood and affect are normal. Speech and behavior are normal. Patient exhibits appropriate insight and judgment.  ____________________________________________    LABS (pertinent positives/negatives) (all labs ordered are listed, but only abnormal results are displayed) Labs Reviewed  COMPREHENSIVE METABOLIC PANEL - Abnormal; Notable for the following:    Calcium 8.3 (*)    All other components within normal limits  CBC WITH DIFFERENTIAL/PLATELET - Abnormal; Notable for the following:    Monocytes Absolute 1.0 (*)    All other components within  normal limits  LIPASE, BLOOD  TROPONIN I  TROPONIN I   ____________________________________________   EKG  Interpreted by me  Date: 03/02/2015  Rate: 74  Rhythm: normal sinus rhythm  QRS Axis: normal  Intervals: normal  ST/T Wave abnormalities: normal  Conduction Disutrbances: none  Narrative Interpretation: unremarkable      ____________________________________________    RADIOLOGY  Chest x-ray unremarkable  ____________________________________________  PROCEDURES   ____________________________________________   INITIAL IMPRESSION / ASSESSMENT AND PLAN / ED COURSE  Pertinent labs & imaging results that were available during my care of the patient were reviewed by me and considered in my medical decision making (see chart for details).  Patient presents with nonspecific chest pain. Low suspicion for ACS PE TAD pneumothorax carditis mediastinitis pneumonia or sepsis. Patient was initially given aspirin and nitroglycerin by EMS without change in symptoms. We'll give her some morphine and antacids in case this will help while getting a workup.  ----------------------------------------- 12:42 AM on 03/02/2015 -----------------------------------------  Pain is improved. Vital signs remained stable and normal. Workup is negative including troponin 2. With this and a normal EKG, even though she does have some ongoing mild pain, have low suspicion that this is ACS or cardiopulmonary related or vascular in nature. I don't think she has a dissection either. We'll discharge her home and have her follow-up with her cardiologist tomorrow, Dr. Humphrey Rolls with [facility].     ____________________________________________   FINAL CLINICAL IMPRESSION(S) / ED DIAGNOSES  Final diagnoses:  Nonspecific chest pain      Carrie Mew, MD 03/02/15 [phone]

## 2015-03-02 NOTE — Discharge Instructions (Signed)
Nonspecific Chest Pain  °Chest pain can be caused by many different conditions. There is always a chance that your pain could be related to something serious, such as a heart attack or a blood clot in your lungs. Chest pain can also be caused by conditions that are not life-threatening. If you have chest pain, it is very important to follow up with your health care provider. °CAUSES  °Chest pain can be caused by: °· Heartburn. °· Pneumonia or bronchitis. °· Anxiety or stress. °· Inflammation around your heart (pericarditis) or lung (pleuritis or pleurisy). °· A blood clot in your lung. °· A collapsed lung (pneumothorax). It can develop suddenly on its own (spontaneous pneumothorax) or from trauma to the chest. °· Shingles infection (varicella-zoster virus). °· Heart attack. °· Damage to the bones, muscles, and cartilage that make up your chest wall. This can include: °¨ Bruised bones due to injury. °¨ Strained muscles or cartilage due to frequent or repeated coughing or overwork. °¨ Fracture to one or more ribs. °¨ Sore cartilage due to inflammation (costochondritis). °RISK FACTORS  °Risk factors for chest pain may include: °· Activities that increase your risk for trauma or injury to your chest. °· Respiratory infections or conditions that cause frequent coughing. °· Medical conditions or overeating that can cause heartburn. °· Heart disease or family history of heart disease. °· Conditions or health behaviors that increase your risk of developing a blood clot. °· Having had chicken pox (varicella zoster). °SIGNS AND SYMPTOMS °Chest pain can feel like: °· Burning or tingling on the surface of your chest or deep in your chest. °· Crushing, pressure, aching, or squeezing pain. °· Dull or sharp pain that is worse when you move, cough, or take a deep breath. °· Pain that is also felt in your back, neck, shoulder, or arm, or pain that spreads to any of these areas. °Your chest pain may come and go, or it may stay  constant. °DIAGNOSIS °Lab tests or other studies may be needed to find the cause of your pain. Your health care provider may have you take a test called an ambulatory ECG (electrocardiogram). An ECG records your heartbeat patterns at the time the test is performed. You may also have other tests, such as: °· Transthoracic echocardiogram (TTE). During echocardiography, sound waves are used to create a picture of all of the heart structures and to look at how blood flows through your heart. °· Transesophageal echocardiogram (TEE). This is a more advanced imaging test that obtains images from inside your body. It allows your health care provider to see your heart in finer detail. °· Cardiac monitoring. This allows your health care provider to monitor your heart rate and rhythm in real time. °· Holter monitor. This is a portable device that records your heartbeat and can help to diagnose abnormal heartbeats. It allows your health care provider to track your heart activity for several days, if needed. °· Stress tests. These can be done through exercise or by taking medicine that makes your heart beat more quickly. °· Blood tests. °· Imaging tests. °TREATMENT  °Your treatment depends on what is causing your chest pain. Treatment may include: °· Medicines. These may include: °¨ Acid blockers for heartburn. °¨ Anti-inflammatory medicine. °¨ Pain medicine for inflammatory conditions. °¨ Antibiotic medicine, if an infection is present. °¨ Medicines to dissolve blood clots. °¨ Medicines to treat coronary artery disease. °· Supportive care for conditions that do not require medicines. This may include: °¨ Resting. °¨ Applying heat   or cold packs to injured areas. °¨ Limiting activities until pain decreases. °HOME CARE INSTRUCTIONS °· If you were prescribed an antibiotic medicine, finish it all even if you start to feel better. °· Avoid any activities that bring on chest pain. °· Do not use any tobacco products, including  cigarettes, chewing tobacco, or electronic cigarettes. If you need help quitting, ask your health care provider. °· Do not drink alcohol. °· Take medicines only as directed by your health care provider. °· Keep all follow-up visits as directed by your health care provider. This is important. This includes any further testing if your chest pain does not go away. °· If heartburn is the cause for your chest pain, you may be told to keep your head raised (elevated) while sleeping. This reduces the chance that acid will go from your stomach into your esophagus. °· Make lifestyle changes as directed by your health care provider. These may include: °¨ Getting regular exercise. Ask your health care provider to suggest some activities that are safe for you. °¨ Eating a heart-healthy diet. A registered dietitian can help you to learn healthy eating options. °¨ Maintaining a healthy weight. °¨ Managing diabetes, if necessary. °¨ Reducing stress. °SEEK MEDICAL CARE IF: °· Your chest pain does not go away after treatment. °· You have a rash with blisters on your chest. °· You have a fever. °SEEK IMMEDIATE MEDICAL CARE IF:  °· Your chest pain is worse. °· You have an increasing cough, or you cough up blood. °· You have severe abdominal pain. °· You have severe weakness. °· You faint. °· You have chills. °· You have sudden, unexplained chest discomfort. °· You have sudden, unexplained discomfort in your arms, back, neck, or jaw. °· You have shortness of breath at any time. °· You suddenly start to sweat, or your skin gets clammy. °· You feel nauseous or you vomit. °· You suddenly feel light-headed or dizzy. °· Your heart begins to beat quickly, or it feels like it is skipping beats. °These symptoms may represent a serious problem that is an emergency. Do not wait to see if the symptoms will go away. Get medical help right away. Call your local emergency services (911 in the [place] Do not drive yourself to the hospital. °  °This  information is not intended to replace advice given to you by your health care provider. Make sure you discuss any questions you have with your health care provider. °  °Document Released: 01/08/2005 Document Revised: 04/21/2014 Document Reviewed: 11/04/2013 °Elsevier Interactive Patient Education ©2016 [employer] ° °

## 2015-03-03 ENCOUNTER — Emergency Department: Payer: Medicare Other

## 2015-03-03 ENCOUNTER — Encounter: Payer: Self-pay | Admitting: Emergency Medicine

## 2015-03-03 ENCOUNTER — Emergency Department
Admission: EM | Admit: 2015-03-03 | Discharge: 2015-03-03 | Disposition: A | Discharge status: Home / Self Care | Payer: Medicare Other | Attending: Emergency Medicine | Admitting: Emergency Medicine

## 2015-03-03 DIAGNOSIS — F10920 Alcohol use, unspecified with intoxication, uncomplicated: Secondary | ICD-10-CM

## 2015-03-03 DIAGNOSIS — S1093XA Contusion of unspecified part of neck, initial encounter: Secondary | ICD-10-CM | POA: Insufficient documentation

## 2015-03-03 DIAGNOSIS — S0001XA Abrasion of scalp, initial encounter: Secondary | ICD-10-CM | POA: Insufficient documentation

## 2015-03-03 DIAGNOSIS — Z87891 Personal history of nicotine dependence: Secondary | ICD-10-CM | POA: Insufficient documentation

## 2015-03-03 DIAGNOSIS — Y9289 Other specified places as the place of occurrence of the external cause: Secondary | ICD-10-CM | POA: Insufficient documentation

## 2015-03-03 DIAGNOSIS — F10120 Alcohol abuse with intoxication, uncomplicated: Secondary | ICD-10-CM | POA: Diagnosis not present

## 2015-03-03 DIAGNOSIS — Y9389 Activity, other specified: Secondary | ICD-10-CM | POA: Insufficient documentation

## 2015-03-03 DIAGNOSIS — I10 Essential (primary) hypertension: Secondary | ICD-10-CM | POA: Insufficient documentation

## 2015-03-03 DIAGNOSIS — S0990XA Unspecified injury of head, initial encounter: Secondary | ICD-10-CM | POA: Diagnosis present

## 2015-03-03 DIAGNOSIS — Y998 Other external cause status: Secondary | ICD-10-CM | POA: Insufficient documentation

## 2015-03-03 DIAGNOSIS — S0083XA Contusion of other part of head, initial encounter: Secondary | ICD-10-CM | POA: Diagnosis not present

## 2015-03-03 DIAGNOSIS — Z7982 Long term (current) use of aspirin: Secondary | ICD-10-CM | POA: Diagnosis not present

## 2015-03-03 DIAGNOSIS — S0003XA Contusion of scalp, initial encounter: Secondary | ICD-10-CM | POA: Diagnosis not present

## 2015-03-03 DIAGNOSIS — Z79899 Other long term (current) drug therapy: Secondary | ICD-10-CM | POA: Diagnosis not present

## 2015-03-03 LAB — COMPREHENSIVE METABOLIC PANEL
ALT: 16 U/L (ref 14–54)
AST: 26 U/L (ref 15–41)
Albumin: 3.9 g/dL (ref 3.5–5.0)
Alkaline Phosphatase: 62 U/L (ref 38–126)
Anion gap: 9 (ref 5–15)
BUN: 9 mg/dL (ref 6–20)
CO2: 23 mmol/L (ref 22–32)
Calcium: 8.2 mg/dL — ABNORMAL LOW (ref 8.9–10.3)
Chloride: 103 mmol/L (ref 101–111)
Creatinine, Ser: 0.59 mg/dL (ref 0.44–1.00)
GFR calc Af Amer: >60 mL/min (ref >60)
GFR calc non Af Amer: >60 mL/min (ref >60)
Glucose, Bld: 98 mg/dL (ref 65–99)
Potassium: 3.7 mmol/L (ref 3.5–5.1)
Sodium: 135 mmol/L (ref 135–145)
Total Bilirubin: 0.3 mg/dL (ref 0.3–1.2)
Total Protein: 7 g/dL (ref 6.5–8.1)

## 2015-03-03 LAB — CBC WITH DIFFERENTIAL/PLATELET
Basophils Absolute: 0.1 10*3/uL (ref 0–0.1)
Basophils Relative: 1 %
Eosinophils Absolute: 0.2 10*3/uL (ref 0–0.7)
Eosinophils Relative: 3 %
HCT: 36.4 % (ref 35.0–47.0)
Hemoglobin: 11.9 g/dL — ABNORMAL LOW (ref 12.0–16.0)
Lymphocytes Relative: 37 %
Lymphs Abs: 2.3 10*3/uL (ref 1.0–3.6)
MCH: 30.2 pg (ref 26.0–34.0)
MCHC: 32.7 g/dL (ref 32.0–36.0)
MCV: 92.6 fL (ref 80.0–100.0)
Monocytes Absolute: 0.8 10*3/uL (ref 0.2–0.9)
Monocytes Relative: 13 %
Neutro Abs: 2.9 10*3/uL (ref 1.4–6.5)
Neutrophils Relative %: 46 %
Platelets: 353 10*3/uL (ref 150–440)
RBC: 3.94 MIL/uL (ref 3.80–5.20)
RDW: 13.8 % (ref 11.5–14.5)
WBC: 6.4 10*3/uL (ref 3.6–11.0)

## 2015-03-03 LAB — ETHANOL: Alcohol, Ethyl (B): 113 mg/dL — ABNORMAL HIGH (ref <5)

## 2015-03-03 MED ORDER — SODIUM CHLORIDE 0.9 % IV BOLUS (SEPSIS)
1000.0000 mL | Freq: Once | INTRAVENOUS | Status: AC
  Administered 2015-03-03: 1000 mL via INTRAVENOUS

## 2015-03-03 MED ORDER — KETOROLAC TROMETHAMINE 30 MG/ML IJ SOLN
30.0000 mg | Freq: Once | INTRAMUSCULAR | Status: AC
  Administered 2015-03-03: 30 mg via INTRAVENOUS

## 2015-03-03 MED ORDER — ONDANSETRON HCL 4 MG/2ML IJ SOLN
4.0000 mg | Freq: Once | INTRAMUSCULAR | Status: AC
  Administered 2015-03-03: 4 mg via INTRAVENOUS
  Filled 2015-03-03: qty 2

## 2015-03-03 MED ORDER — MORPHINE SULFATE (PF) 2 MG/ML IV SOLN
2.0000 mg | Freq: Once | INTRAVENOUS | Status: AC
  Administered 2015-03-03: 2 mg via INTRAVENOUS
  Filled 2015-03-03: qty 1

## 2015-03-03 MED ORDER — KETOROLAC TROMETHAMINE 30 MG/ML IJ SOLN
INTRAMUSCULAR | Status: AC
  Administered 2015-03-03: 30 mg via INTRAVENOUS
  Filled 2015-03-03: qty 1

## 2015-03-03 NOTE — ED Notes (Signed)
Patient transported to CT 
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
## 2015-03-03 NOTE — ED Notes (Signed)
Pt returned to room  
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
## 2015-03-03 NOTE — Discharge Instructions (Signed)
1. Apply ice to affected area several times daily. 2. You may take Tylenol and/or ibuprofen as needed for pain. 3. Return to the ER for worsening symptoms, persistent vomiting, lethargy or other concerns.  Facial or Scalp Contusion A facial or scalp contusion is a deep bruise on the face or head. Injuries to the face and head generally cause a lot of swelling, especially around the eyes. Contusions are the result of an injury that caused bleeding under the skin. The contusion may turn blue, purple, or yellow. Minor injuries will give you a painless contusion, but more severe contusions may stay painful and swollen for a few weeks.  CAUSES  A facial or scalp contusion is caused by a blunt injury or trauma to the face or head area.  SIGNS AND SYMPTOMS   Swelling of the injured area.   Discoloration of the injured area.   Tenderness, soreness, or pain in the injured area.  DIAGNOSIS  The diagnosis can be made by taking a medical history and doing a physical exam. An X-ray exam, CT scan, or MRI may be needed to determine if there are any associated injuries, such as broken bones (fractures). TREATMENT  Often, the best treatment for a facial or scalp contusion is applying cold compresses to the injured area. Over-the-counter medicines may also be recommended for pain control.  HOME CARE INSTRUCTIONS   Only take over-the-counter or prescription medicines as directed by your health care provider.   Apply ice to the injured area.   Put ice in a plastic bag.   Place a towel between your skin and the bag.   Leave the ice on for 20 minutes, 2-3 times a day.  SEEK MEDICAL CARE IF:  You have bite problems.   You have pain with chewing.   You are concerned about facial defects. SEEK IMMEDIATE MEDICAL CARE IF:  You have severe pain or a headache that is not relieved by medicine.   You have unusual sleepiness, confusion, or personality changes.   You throw up (vomit).   You  have a persistent nosebleed.   You have double vision or blurred vision.   You have fluid drainage from your nose or ear.   You have difficulty walking or using your arms or legs.  MAKE SURE YOU:   Understand these instructions.  Will watch your condition.  Will get help right away if you are not doing well or get worse.   This information is not intended to replace advice given to you by your health care provider. Make sure you discuss any questions you have with your health care provider.   Document Released: 05/08/2004 Document Revised: 04/21/2014 Document Reviewed: 11/11/2012 Elsevier Interactive Patient Education 2016 [employer].  Alcohol Intoxication Alcohol intoxication occurs when the amount of alcohol that a person has consumed impairs his or her ability to mentally and physically function. Alcohol directly impairs the normal chemical activity of the brain. Drinking large amounts of alcohol can lead to changes in mental function and behavior, and it can cause many physical effects that can be harmful.  Alcohol intoxication can range in severity from mild to very severe. Various factors can affect the level of intoxication that occurs, such as the person's age, gender, weight, frequency of alcohol consumption, and the presence of other medical conditions (such as diabetes, seizures, or heart conditions). Dangerous levels of alcohol intoxication may occur when people drink large amounts of alcohol in a short period (binge drinking). Alcohol can also be  especially dangerous when combined with certain prescription medicines or "recreational" drugs. SIGNS AND SYMPTOMS Some common signs and symptoms of mild alcohol intoxication include:  Loss of coordination.  Changes in mood and behavior.  Impaired judgment.  Slurred speech. As alcohol intoxication progresses to more severe levels, other signs and symptoms will appear. These may include:  Vomiting.  Confusion and  impaired memory.  Slowed breathing.  Seizures.  Loss of consciousness. DIAGNOSIS  Your health care provider will take a medical history and perform a physical exam. You will be asked about the amount and type of alcohol you have consumed. Blood tests will be done to measure the concentration of alcohol in your blood. In many places, your blood alcohol level must be lower than 80 mg/dL (0.08%) to legally drive. However, many dangerous effects of alcohol can occur at much lower levels.  TREATMENT  People with alcohol intoxication often do not require treatment. Most of the effects of alcohol intoxication are temporary, and they go away as the alcohol naturally leaves the body. Your health care provider will monitor your condition until you are stable enough to go home. Fluids are sometimes given through an IV access tube to help prevent dehydration.  HOME CARE INSTRUCTIONS  Do not drive after drinking alcohol.  Stay hydrated. Drink enough water and fluids to keep your urine clear or pale yellow. Avoid caffeine.   Only take over-the-counter or prescription medicines as directed by your health care provider.  SEEK MEDICAL CARE IF:   You have persistent vomiting.   You do not feel better after a few days.  You have frequent alcohol intoxication. Your health care provider can help determine if you should see a substance use treatment counselor. SEEK IMMEDIATE MEDICAL CARE IF:   You become shaky or tremble when you try to stop drinking.   You shake uncontrollably (seizure).   You throw up (vomit) blood. This may be bright red or may look like black coffee grounds.   You have blood in your stool. This may be bright red or may appear as a black, tarry, bad smelling stool.   You become lightheaded or faint.  MAKE SURE YOU:   Understand these instructions.  Will watch your condition.  Will get help right away if you are not doing well or get worse.   This information is not  intended to replace advice given to you by your health care provider. Make sure you discuss any questions you have with your health care provider.   Document Released: 01/08/2005 Document Revised: 12/01/2012 Document Reviewed: 09/03/2012 Elsevier Interactive Patient Education 2016 [place] Assault Assault includes any behavior or physical attack--whether it is on purpose or not--that results in injury to another person, damage to property, or both. This also includes assault that has not yet happened, but is planned to happen. Threats of assault may be physical, verbal, or written. They may be said or sent by:  Mail.  E-mail.  Text.  Social media.  Fax. The threats may be direct, implied, or understood. WHAT ARE THE DIFFERENT FORMS OF ASSAULT? Forms of assault include:  Physically assaulting a person. This includes physical threats to inflict physical harm as well as:  Slapping.  Hitting.  Poking.  Kicking.  Punching.  Pushing.  Sexually assaulting a person. Sexual assault is any sexual activity that a person is forced, threatened, or coerced to participate in. It may or may not involve physical contact with the person who is assaulting you.  You are sexually assaulted if you are forced to have sexual contact of any kind.  Damaging or destroying a person's assistive equipment, such as glasses, canes, or walkers.  Throwing or hitting objects.  Using or displaying a weapon to harm or threaten someone.  Using or displaying an object that appears to be a weapon in a threatening manner.  Using greater physical size or strength to intimidate someone.  Making intimidating or threatening gestures.  Bullying.  Hazing.  Using language that is intimidating, threatening, hostile, or abusive.  Stalking.  Restraining someone with force. WHAT SHOULD I DO IF I EXPERIENCE ASSAULT?  Report assaults, threats, and stalking to the police. Call your local emergency  services (911 in the [place] if you are in immediate danger or you need medical help.  You can work with a Chief Executive Officer or an advocate to get legal protection against someone who has assaulted you or threatened you with assault. Protection includes restraining orders and private addresses. Crimes against you, such as assault, can also be prosecuted through the courts. Laws will vary depending on where you live.   This information is not intended to replace advice given to you by your health care provider. Make sure you discuss any questions you have with your health care provider.   Document Released: 03/31/2005 Document Revised: 04/21/2014 Document Reviewed: 12/16/2013 Elsevier Interactive Patient Education [employer].

## 2015-03-21 ENCOUNTER — Encounter: Payer: Self-pay | Admitting: Family Medicine

## 2015-03-21 ENCOUNTER — Ambulatory Visit (INDEPENDENT_AMBULATORY_CARE_PROVIDER_SITE_OTHER): Payer: Medicare Other | Admitting: Family Medicine

## 2015-03-21 ENCOUNTER — Other Ambulatory Visit: Payer: Self-pay | Admitting: Family Medicine

## 2015-03-21 VITALS — BP 140/78 | HR 70 | Temp 97.8°F | Resp 16 | Ht 66.0 in | Wt 175.2 lb

## 2015-03-21 DIAGNOSIS — I10 Essential (primary) hypertension: Secondary | ICD-10-CM

## 2015-03-21 DIAGNOSIS — R51 Headache: Secondary | ICD-10-CM

## 2015-03-21 DIAGNOSIS — I25119 Atherosclerotic heart disease of native coronary artery with unspecified angina pectoris: Secondary | ICD-10-CM

## 2015-03-21 DIAGNOSIS — J309 Allergic rhinitis, unspecified: Secondary | ICD-10-CM | POA: Insufficient documentation

## 2015-03-21 DIAGNOSIS — Z113 Encounter for screening for infections with a predominantly sexual mode of transmission: Secondary | ICD-10-CM | POA: Diagnosis not present

## 2015-03-21 DIAGNOSIS — R519 Headache, unspecified: Secondary | ICD-10-CM

## 2015-03-21 DIAGNOSIS — E785 Hyperlipidemia, unspecified: Secondary | ICD-10-CM

## 2015-03-21 DIAGNOSIS — K219 Gastro-esophageal reflux disease without esophagitis: Secondary | ICD-10-CM | POA: Diagnosis not present

## 2015-03-21 DIAGNOSIS — B009 Herpesviral infection, unspecified: Secondary | ICD-10-CM | POA: Diagnosis not present

## 2015-03-21 MED ORDER — BUTALBITAL-APAP-CAFFEINE 50-325-40 MG PO TABS: 1.0000 | ORAL_TABLET | Freq: Four times a day (QID) | ORAL | Status: DC | PRN

## 2015-03-21 MED ORDER — FLUTICASONE PROPIONATE 50 MCG/ACT NA SUSP: 2.0000 | Freq: Every day | NASAL | Status: DC

## 2015-03-21 MED ORDER — METOPROLOL TARTRATE 50 MG PO TABS: 50.0000 mg | ORAL_TABLET | Freq: Two times a day (BID) | ORAL | Status: DC

## 2015-03-21 MED ORDER — OMEPRAZOLE 20 MG PO CPDR: 20.0000 mg | DELAYED_RELEASE_CAPSULE | Freq: Two times a day (BID) | ORAL | Status: DC

## 2015-03-21 NOTE — Progress Notes (Signed)
Name: Linda Mejia   MRN: [id number]    DOB: [birth year]   Date:03/21/2015       Progress Note  Subjective  Chief Complaint  Chief Complaint  Patient presents with  . Establish Care    Patient wants to have a pap done. She wants flu shot today and to see about getting hepatitis c shot  . Medication Refill    Patient wants refill of metoporol, omeprazole, firocet (prescribed by another doctor) and fluticasone (nasal spray).    HPI  Linda Mejia is a 59 y.o. female here today to transition care of medical needs to a primary care provider. She reports recent history of exposure to genital herpes which she is quite upset about. Currently on treatment with Acyclovir three times a day. Otherwise has a history of CAD with angina. Earlier this year taking BC powder for joint pain once in a while combined with high stress levels resulting in multiple stomach ulcers and GI bleed. Now avoid NSAIDs as much as possible. Fioricet helps with headaches. Otherwise would like refill of metoprolol, omeprazole, flonase. Has chronic neck pain. She has had a fall in November with visit to the ER: CT head and neck were done:  IMPRESSION: 1. Posterior scalp hematoma without subjacent fracture or acute intracranial abnormality. 2. No facial bone fracture. 3. Degenerative change in the cervical spine, no acute fracture or subluxation.    Past Medical History  Diagnosis Date  . Anxiety   . Coronary artery disease   . Hypertension   . ADHD (attention deficit hyperactivity disorder)   . Chronic back pain   . History of stomach ulcers     x7 this year. Bleeding  . Herpes genitalis in women     Patient Active Problem List   Diagnosis Date Noted  . Herpes simplex type 2 infection 03/21/2015  . Cervical pain 09/21/2014  . ADD (attention deficit hyperactivity disorder, inattentive type) 09/20/2014  . Episodic paroxysmal anxiety disorder 09/20/2014  . History of gastric ulcer 09/20/2014  . Abuse, drug or  alcohol 09/20/2014  . Breathlessness on exertion 08/28/2014  . HLD (hyperlipidemia) 10/03/2009  . Coronary artery disease involving native coronary artery of native heart with angina pectoris ([place]) 10/02/2009  . Hypertension goal BP (blood pressure) < 140/90 10/02/2009    Social History  Substance Use Topics  . Smoking status: Former Smoker -- 1.00 packs/day for 30 years  . Smokeless tobacco: Not on file  . Alcohol Use: Yes     Current outpatient prescriptions:  .  acyclovir (ZOVIRAX) 400 MG tablet, Take 1 tablet by mouth 3 (three) times daily., Disp: , Rfl:  .  amphetamine-dextroamphetamine (ADDERALL) 30 MG tablet, Take 30 mg by mouth 2 (two) times daily., Disp: , Rfl:  .  aspirin EC 81 MG tablet, Take 81 mg by mouth daily., Disp: , Rfl:  .  diazepam (VALIUM) 10 MG tablet, Take 1 tablet by mouth 3 (three) times daily., Disp: , Rfl:  .  fluticasone (FLONASE) 50 MCG/ACT nasal spray, Place 2 sprays into both nostrils daily., Disp: , Rfl:  .  metoprolol (LOPRESSOR) 50 MG tablet, Take 1 tablet (50 mg total) by mouth 2 (two) times daily., Disp: 60 tablet, Rfl: 1 .  omeprazole (PRILOSEC) 20 MG capsule, Take 20 mg by mouth 2 (two) times daily before a meal., Disp: , Rfl:  .  rosuvastatin (CRESTOR) 20 MG tablet, Take 20 mg by mouth daily.  , Disp: , Rfl:  .  zolpidem (AMBIEN)  10 MG tablet, Take 10 mg by mouth at bedtime as needed.  , Disp: , Rfl:  .  ibuprofen (ADVIL,MOTRIN) 800 MG tablet, Take 1 tablet (800 mg total) by mouth every 8 (eight) hours as needed. (Patient not taking: Reported on 03/01/2015), Disp: 30 tablet, Rfl: 0  Past Surgical History  Procedure Laterality Date  . Cesarean section    . Tubal ligation    . Cardiac catheterization    . Tonsillectomy      Family History  Problem Relation Age of Onset  . Diabetes Mother   . Heart disease Father     No Known Allergies   Review of Systems  CONSTITUTIONAL: No significant weight changes, fever, chills, weakness or  fatigue.  HEENT:  - Eyes: No visual changes.  - Ears: No auditory changes. No pain.  - Nose: No sneezing, congestion, runny nose. - Throat: No sore throat. No changes in swallowing. SKIN: No rash or itching.  CARDIOVASCULAR: No chest pain, chest pressure or chest discomfort. No palpitations or edema.  RESPIRATORY: No shortness of breath, cough or sputum.  GASTROINTESTINAL: No anorexia, nausea, vomiting. No changes in bowel habits. No abdominal pain or blood.  GENITOURINARY: No dysuria. No frequency. No discharge.  NEUROLOGICAL: Yes headaches. No dizziness, syncope, paralysis, ataxia, numbness or tingling in the extremities. No memory changes. No change in bowel or bladder control.  MUSCULOSKELETAL: Chronic joint pain. No muscle pain. HEMATOLOGIC: No anemia, bleeding or bruising.  LYMPHATICS: No enlarged lymph nodes.  PSYCHIATRIC: No change in mood. No change in sleep pattern.  ENDOCRINOLOGIC: No reports of sweating, cold or heat intolerance. No polyuria or polydipsia.     Objective  BP 140/78 mmHg  Pulse 70  Temp(Src) 97.8 F (36.6 C) (Oral)  Resp 16  Ht _0  (1.676 m)  Wt 175 lb 3.2 oz (79.47 kg)  BMI 28.29 kg/m2  SpO2 95% Body mass index is 28.29 kg/(m^2).  Physical Exam  Constitutional: Patient appears well-developed and well-nourished. In no distress.  HEENT:  - Head: Normocephalic and atraumatic.  - Ears: Bilateral TMs gray, no erythema or effusion - Nose: Nasal mucosa moist - Mouth/Throat: Oropharynx is clear and moist. No tonsillar hypertrophy or erythema. No post nasal drainage.  - Eyes: Conjunctivae clear, EOM movements normal. PERRLA. No scleral icterus.  Neck: Normal range of motion. Neck supple. No JVD present. No thyromegaly present.  Cardiovascular: Normal rate, regular rhythm and normal heart sounds.  No murmur heard.  Pulmonary/Chest: Effort normal and breath sounds normal. No respiratory distress. Musculoskeletal: Normal range of motion bilateral UE and  LE, no joint effusions. Peripheral vascular: Bilateral LE no edema. Neurological: CN II-XII grossly intact with no focal deficits. Alert and oriented to person, place, and time. Coordination, balance, strength, speech and gait are normal.  Skin: Skin is warm and dry. No rash noted. No erythema.  Psychiatric: Patient has a normal mood and affect. Behavior is normal in office today. Judgment and thought content normal in office today.   Recent Results (from the past 2160 hour(s))  Comprehensive metabolic panel     Status: Abnormal   Collection Time: 03/01/15  8:03 PM  Result Value Ref Range   Sodium 140 135 - 145 mmol/L   Potassium 3.7 3.5 - 5.1 mmol/L   Chloride 107 101 - 111 mmol/L   CO2 26 22 - 32 mmol/L   Glucose, Bld 76 65 - 99 mg/dL   BUN 13 6 - 20 mg/dL   Creatinine, Ser 0.61 0.44 -  1.00 mg/dL   Calcium 8.3 (L) 8.9 - 10.3 mg/dL   Total Protein 7.0 6.5 - 8.1 g/dL   Albumin 3.7 3.5 - 5.0 g/dL   AST 23 15 - 41 U/L   ALT 14 14 - 54 U/L   Alkaline Phosphatase 59 38 - 126 U/L   Total Bilirubin 0.3 0.3 - 1.2 mg/dL   GFR calc non Af Amer >60 >60 mL/min   GFR calc Af Amer >60 >60 mL/min    Comment: (NOTE) The eGFR has been calculated using the CKD EPI equation. This calculation has not been validated in all clinical situations. eGFR's persistently <60 mL/min signify possible Chronic Kidney Disease.    Anion gap 7 5 - 15  Lipase, blood     Status: None   Collection Time: 03/01/15  8:03 PM  Result Value Ref Range   Lipase 38 11 - 51 U/L  Troponin I     Status: None   Collection Time: 03/01/15  8:03 PM  Result Value Ref Range   Troponin I <0.03 <0.031 ng/mL    Comment:        NO INDICATION OF MYOCARDIAL INJURY.   CBC with Differential     Status: Abnormal   Collection Time: 03/01/15  8:03 PM  Result Value Ref Range   WBC 8.1 3.6 - 11.0 K/uL   RBC 3.90 3.80 - 5.20 MIL/uL   Hemoglobin 12.2 12.0 - 16.0 g/dL   HCT 36.1 35.0 - 47.0 %   MCV 92.6 80.0 - 100.0 fL   MCH 31.3  26.0 - 34.0 pg   MCHC 33.8 32.0 - 36.0 g/dL   RDW 13.9 11.5 - 14.5 %   Platelets 348 150 - 440 K/uL   Neutrophils Relative % 46 %   Neutro Abs 3.8 1.4 - 6.5 K/uL   Lymphocytes Relative 37 %   Lymphs Abs 3.0 1.0 - 3.6 K/uL   Monocytes Relative 13 %   Monocytes Absolute 1.0 (H) 0.2 - 0.9 K/uL   Eosinophils Relative 3 %   Eosinophils Absolute 0.2 0 - 0.7 K/uL   Basophils Relative 1 %   Basophils Absolute 0.1 0 - 0.1 K/uL  Troponin I     Status: None   Collection Time: 03/01/15 11:03 PM  Result Value Ref Range   Troponin I <0.03 <0.031 ng/mL    Comment:        NO INDICATION OF MYOCARDIAL INJURY.   CBC with Differential     Status: Abnormal   Collection Time: 03/03/15  2:48 AM  Result Value Ref Range   WBC 6.4 3.6 - 11.0 K/uL   RBC 3.94 3.80 - 5.20 MIL/uL   Hemoglobin 11.9 (L) 12.0 - 16.0 g/dL   HCT 36.4 35.0 - 47.0 %   MCV 92.6 80.0 - 100.0 fL   MCH 30.2 26.0 - 34.0 pg   MCHC 32.7 32.0 - 36.0 g/dL   RDW 13.8 11.5 - 14.5 %   Platelets 353 150 - 440 K/uL   Neutrophils Relative % 46 %   Neutro Abs 2.9 1.4 - 6.5 K/uL   Lymphocytes Relative 37 %   Lymphs Abs 2.3 1.0 - 3.6 K/uL   Monocytes Relative 13 %   Monocytes Absolute 0.8 0.2 - 0.9 K/uL   Eosinophils Relative 3 %   Eosinophils Absolute 0.2 0 - 0.7 K/uL   Basophils Relative 1 %   Basophils Absolute 0.1 0 - 0.1 K/uL  Comprehensive metabolic panel     Status: Abnormal  Collection Time: 03/03/15  2:48 AM  Result Value Ref Range   Sodium 135 135 - 145 mmol/L   Potassium 3.7 3.5 - 5.1 mmol/L   Chloride 103 101 - 111 mmol/L   CO2 23 22 - 32 mmol/L   Glucose, Bld 98 65 - 99 mg/dL   BUN 9 6 - 20 mg/dL   Creatinine, Ser 0.59 0.44 - 1.00 mg/dL   Calcium 8.2 (L) 8.9 - 10.3 mg/dL   Total Protein 7.0 6.5 - 8.1 g/dL   Albumin 3.9 3.5 - 5.0 g/dL   AST 26 15 - 41 U/L   ALT 16 14 - 54 U/L   Alkaline Phosphatase 62 38 - 126 U/L   Total Bilirubin 0.3 0.3 - 1.2 mg/dL   GFR calc non Af Amer >60 >60 mL/min   GFR calc Af Amer >60  >60 mL/min    Comment: (NOTE) The eGFR has been calculated using the CKD EPI equation. This calculation has not been validated in all clinical situations. eGFR's persistently <60 mL/min signify possible Chronic Kidney Disease.    Anion gap 9 5 - 15  Ethanol     Status: Abnormal   Collection Time: 03/03/15  2:48 AM  Result Value Ref Range   Alcohol, Ethyl (B) 113 (H) <5 mg/dL    Comment:        LOWEST DETECTABLE LIMIT FOR SERUM ALCOHOL IS 5 mg/dL FOR MEDICAL PURPOSES ONLY      Assessment & Plan   1. Herpes simplex type 2 infection Recent exposure. Will get STD testing and have her f/u for CPE with PAP and pelvic exam.  - HIV antibody - HSV 2 antibody, IgG - RPR - Hepatitis C antibody - Hepatitis B surface antibody - Hepatitis A antibody, total  2. Coronary artery disease involving native coronary artery of native heart with angina pectoris [facility]) Medical management optimized.  - metoprolol (LOPRESSOR) 50 MG tablet; Take 1 tablet (50 mg total) by mouth 2 (two) times daily.  Dispense: 60 tablet; Refill: 5 - Hemoglobin A1c - Lipid panel - TSH  3. Hypertension goal BP (blood pressure) < 140/90 Stable.  - metoprolol (LOPRESSOR) 50 MG tablet; Take 1 tablet (50 mg total) by mouth 2 (two) times daily.  Dispense: 60 tablet; Refill: 5 - Hemoglobin A1c - Lipid panel - TSH  4. HLD (hyperlipidemia) Recheck FLP.  - Hemoglobin A1c - Lipid panel - TSH  5. Allergic rhinitis, unspecified allergic rhinitis type Refilled.  - fluticasone (FLONASE) 50 MCG/ACT nasal spray; Place 2 sprays into both nostrils daily.  Dispense: 16 g; Refill: 5  6. Screening for STD (sexually transmitted disease)  - HIV antibody - HSV 2 antibody, IgG - RPR - Hepatitis C antibody - Hepatitis B surface antibody - Hepatitis A antibody, total  7. Headache disorder Refilled.  - butalbital-acetaminophen-caffeine (FIORICET) 50-325-40 MG tablet; Take 1 tablet by mouth every 6 (six) hours as needed  for headache.  Dispense: 30 tablet; Refill: 5  8. GERD without esophagitis Stable on PPI.   - omeprazole (PRILOSEC) 20 MG capsule; Take 1 capsule (20 mg total) by mouth 2 (two) times daily before a meal.  Dispense: 60 capsule; Refill: 5

## 2015-03-22 DIAGNOSIS — I25119 Atherosclerotic heart disease of native coronary artery with unspecified angina pectoris: Secondary | ICD-10-CM | POA: Diagnosis not present

## 2015-03-22 DIAGNOSIS — Z113 Encounter for screening for infections with a predominantly sexual mode of transmission: Secondary | ICD-10-CM | POA: Diagnosis not present

## 2015-03-22 DIAGNOSIS — B009 Herpesviral infection, unspecified: Secondary | ICD-10-CM | POA: Diagnosis not present

## 2015-03-22 DIAGNOSIS — I10 Essential (primary) hypertension: Secondary | ICD-10-CM | POA: Diagnosis not present

## 2015-03-22 DIAGNOSIS — E785 Hyperlipidemia, unspecified: Secondary | ICD-10-CM | POA: Diagnosis not present

## 2015-03-23 LAB — HIV ANTIBODY (ROUTINE TESTING W REFLEX): HIV Screen 4th Generation wRfx: NONREACTIVE

## 2015-03-23 LAB — HEMOGLOBIN A1C
Est. average glucose Bld gHb Est-mCnc: 120 mg/dL
Hgb A1c MFr Bld: 5.8 % — ABNORMAL HIGH (ref 4.8–5.6)

## 2015-03-23 LAB — HEPATITIS A ANTIBODY, TOTAL: Hep A Total Ab: POSITIVE — AB

## 2015-03-23 LAB — HSV 2 ANTIBODY, IGG: HSV 2 Glycoprotein G Ab, IgG: 2.13 index — ABNORMAL HIGH (ref 0.00–0.90)

## 2015-03-23 LAB — LIPID PANEL
Chol/HDL Ratio: 3.5 ratio units (ref 0.0–4.4)
Cholesterol, Total: 264 mg/dL — ABNORMAL HIGH (ref 100–199)
HDL: 75 mg/dL (ref >39)
LDL Calculated: 176 mg/dL — ABNORMAL HIGH (ref 0–99)
Triglycerides: 65 mg/dL (ref 0–149)
VLDL Cholesterol Cal: 13 mg/dL (ref 5–40)

## 2015-03-23 LAB — HEPATITIS B SURFACE ANTIBODY,QUALITATIVE: Hep B Surface Ab, Qual: REACTIVE

## 2015-03-23 LAB — TSH: TSH: 4.95 u[IU]/mL — ABNORMAL HIGH (ref 0.450–4.500)

## 2015-03-23 LAB — RPR: RPR Ser Ql: NONREACTIVE

## 2015-03-23 LAB — HEPATITIS C ANTIBODY: Hep C Virus Ab: 0.1 s/co ratio (ref 0.0–0.9)

## 2015-03-26 ENCOUNTER — Ambulatory Visit (INDEPENDENT_AMBULATORY_CARE_PROVIDER_SITE_OTHER): Payer: Medicare Other | Admitting: Family Medicine

## 2015-03-26 ENCOUNTER — Encounter: Payer: Self-pay | Admitting: Family Medicine

## 2015-03-26 VITALS — BP 124/76 | HR 74 | Temp 97.9°F | Resp 12 | Wt 175.7 lb

## 2015-03-26 DIAGNOSIS — Z23 Encounter for immunization: Secondary | ICD-10-CM

## 2015-03-26 DIAGNOSIS — Z124 Encounter for screening for malignant neoplasm of cervix: Secondary | ICD-10-CM

## 2015-03-26 DIAGNOSIS — J0100 Acute maxillary sinusitis, unspecified: Secondary | ICD-10-CM | POA: Diagnosis not present

## 2015-03-26 DIAGNOSIS — E039 Hypothyroidism, unspecified: Secondary | ICD-10-CM | POA: Insufficient documentation

## 2015-03-26 DIAGNOSIS — Z0000 Encounter for general adult medical examination without abnormal findings: Secondary | ICD-10-CM

## 2015-03-26 DIAGNOSIS — E785 Hyperlipidemia, unspecified: Secondary | ICD-10-CM

## 2015-03-26 DIAGNOSIS — Z1231 Encounter for screening mammogram for malignant neoplasm of breast: Secondary | ICD-10-CM | POA: Diagnosis not present

## 2015-03-26 DIAGNOSIS — E038 Other specified hypothyroidism: Secondary | ICD-10-CM | POA: Insufficient documentation

## 2015-03-26 MED ORDER — PREDNISONE 10 MG (21) PO TBPK
ORAL_TABLET | ORAL | Status: DC
Start: 2015-03-26 — End: 2015-06-05

## 2015-03-26 MED ORDER — ROSUVASTATIN CALCIUM 20 MG PO TABS
20.0000 mg | ORAL_TABLET | Freq: Every day | ORAL | Status: DC
Start: 2015-03-26 — End: 2016-06-09

## 2015-03-26 MED ORDER — AZITHROMYCIN 250 MG PO TABS: ORAL_TABLET | ORAL | Status: DC

## 2015-03-26 NOTE — Progress Notes (Signed)
Name: Linda Mejia   MRN: [id number]    DOB: 09/16/1955   Date:03/26/2015       Progress Note  Subjective  Chief Complaint  Chief Complaint  Patient presents with  . Annual Exam  . Sinusitis    patient reports that she has a lot of nasal congestion at night.    HPI  Patient is here today for a Complete Female Physical Exam:  The patient has has acute complaints of sinus pressure and congestion without fevers or headaches. Onset about 3 days ago. Overall feels health needs are stable. Diet is well balanced. In general does not exercise regularly. Sees dentist regularly and addresses vision concerns with ophthalmologist if applicable. In regards to sexual activity the patient is not currentlysexually active. Currently is concerned about exposure to any STDs. Recently confirmed exposure to HSV Type 2. Other lab work done recently to review today. Not taking statin regularly, crestor did not cause myalgia but simvastatin and lipitor did.   Active Ambulatory Problems    Diagnosis Date Noted  . ADD (attention deficit hyperactivity disorder, inattentive type) 09/20/2014  . Cervical pain 09/21/2014  . Coronary artery disease involving native coronary artery of native heart with angina pectoris ([place]) 10/02/2009  . Breathlessness on exertion 08/28/2014  . Hypertension goal BP (blood pressure) < 140/90 10/02/2009  . HLD (hyperlipidemia) 10/03/2009  . Episodic paroxysmal anxiety disorder 09/20/2014  . History of gastric ulcer 09/20/2014  . Abuse, drug or alcohol 09/20/2014  . Herpes simplex type 2 infection 03/21/2015  . Rhinitis, allergic 03/21/2015  . Screening for STD (sexually transmitted disease) 03/21/2015  . Annual physical exam 03/26/2015  . Encounter for screening mammogram for malignant neoplasm of breast 03/26/2015  . Encounter for screening for malignant neoplasm of cervix 03/26/2015  . Acute maxillary sinusitis 03/26/2015  . Need for immunization against influenza 03/26/2015    Resolved Ambulatory Problems    Diagnosis Date Noted  . HYPERLIPIDEMIA-MIXED 10/03/2009  . HYPERTENSION, UNSPECIFIED 10/02/2009  . CAD, NATIVE VESSEL 10/02/2009   Past Medical History  Diagnosis Date  . Anxiety   . Coronary artery disease   . Hypertension   . ADHD (attention deficit hyperactivity disorder)   . Chronic back pain   . History of stomach ulcers   . Herpes genitalis in women     Past Surgical History  Procedure Laterality Date  . Cesarean section    . Tubal ligation    . Cardiac catheterization    . Tonsillectomy      Family History  Problem Relation Age of Onset  . Diabetes Mother   . Heart disease Father     Social History   Social History  . Marital Status: Married    Spouse Name: N/A  . Number of Children: N/A  . Years of Education: N/A   Occupational History  . Not on file.   Social History Main Topics  . Smoking status: Former Smoker -- 1.00 packs/day for 30 years  . Smokeless tobacco: Not on file  . Alcohol Use: Yes  . Drug Use: No  . Sexual Activity: Not on file   Other Topics Concern  . Not on file   Social History Narrative     Current outpatient prescriptions:  .  acyclovir (ZOVIRAX) 400 MG tablet, Take 1 tablet by mouth 2 (two) times daily as needed., Disp: , Rfl:  .  amphetamine-dextroamphetamine (ADDERALL) 30 MG tablet, Take 30 mg by mouth 2 (two) times daily., Disp: , Rfl:  .  aspirin EC 81 MG tablet, Take 81 mg by mouth daily., Disp: , Rfl:  .  azithromycin (ZITHROMAX) 250 MG tablet, Take 2 tab po day 1 then 1 tab po q day for 4 more days, Zpak, Disp: 6 tablet, Rfl: 0 .  butalbital-acetaminophen-caffeine (FIORICET) 50-325-40 MG tablet, Take 1 tablet by mouth every 6 (six) hours as needed for headache., Disp: 30 tablet, Rfl: 5 .  diazepam (VALIUM) 10 MG tablet, Take 1 tablet by mouth 3 (three) times daily., Disp: , Rfl:  .  fluticasone (FLONASE) 50 MCG/ACT nasal spray, Place 2 sprays into both nostrils daily., Disp: 16 g,  Rfl: 5 .  ibuprofen (ADVIL,MOTRIN) 800 MG tablet, Take 1 tablet (800 mg total) by mouth every 8 (eight) hours as needed. (Patient not taking: Reported on 03/01/2015), Disp: 30 tablet, Rfl: 0 .  metoprolol (LOPRESSOR) 50 MG tablet, Take 1 tablet (50 mg total) by mouth 2 (two) times daily., Disp: 60 tablet, Rfl: 5 .  omeprazole (PRILOSEC) 20 MG capsule, Take 1 capsule (20 mg total) by mouth 2 (two) times daily before a meal., Disp: 60 capsule, Rfl: 5 .  predniSONE (STERAPRED UNI-PAK 21 TAB) 10 MG (21) TBPK tablet, Use as directed in a 6 day taper PredPak, Disp: 21 tablet, Rfl: 0 .  rosuvastatin (CRESTOR) 20 MG tablet, Take 20 mg by mouth daily.  , Disp: , Rfl:  .  zolpidem (AMBIEN) 10 MG tablet, Take 10 mg by mouth at bedtime as needed.  , Disp: , Rfl:   No Known Allergies  ROS  CONSTITUTIONAL: No significant weight changes, fever, chills, weakness or fatigue.  HEENT:  - Eyes: No visual changes.  - Ears: No auditory changes. No pain.  - Nose: No sneezing, congestion, runny nose. - Throat: No sore throat. No changes in swallowing. SKIN: No rash or itching.  CARDIOVASCULAR: No chest pain, chest pressure or chest discomfort. No palpitations or edema.  RESPIRATORY: No shortness of breath, cough or sputum.  GASTROINTESTINAL: No anorexia, nausea, vomiting. No changes in bowel habits. No abdominal pain or blood.  GENITOURINARY: No dysuria. No frequency. No discharge.  NEUROLOGICAL: No headache, dizziness, syncope, paralysis, ataxia, numbness or tingling in the extremities. No memory changes. No change in bowel or bladder control.  MUSCULOSKELETAL: No joint pain. No muscle pain. HEMATOLOGIC: No anemia, bleeding or bruising.  LYMPHATICS: No enlarged lymph nodes.  PSYCHIATRIC: No change in mood. No change in sleep pattern.  ENDOCRINOLOGIC: No reports of sweating, cold or heat intolerance. No polyuria or polydipsia.   Objective  Filed Vitals:   03/26/15 1341  BP: 124/76  Pulse: 74  Temp: 97.9  F (36.6 C)  TempSrc: Oral  Resp: 12  Weight: 175 lb 11.2 oz (79.697 kg)  SpO2: 97%   Body mass index is 28.37 kg/(m^2).   Physical Exam  Constitutional: Patient appears well-developed and well-nourished. In no distress.  HEENT:  - Head: Normocephalic and atraumatic.  - Ears: Bilateral TMs gray, no erythema or effusion - Nose: Nasal mucosa moist, boggy and congested.  - Mouth/Throat: Oropharynx is clear and moist. No tonsillar hypertrophy or erythema. No post nasal drainage.  - Eyes: Conjunctivae clear, EOM movements normal. PERRLA. No scleral icterus.  Neck: Normal range of motion. Neck supple. No JVD present. No thyromegaly present.  Cardiovascular: Normal rate, regular rhythm and normal heart sounds.  No murmur heard.  Pulmonary/Chest: Effort normal and breath sounds normal. No respiratory distress. Abdominal: Soft. Bowel sounds are normal, no distension. There is no tenderness. no  masses BREAST: Bilateral breast exam normal with no masses, skin changes or nipple discharge FEMALE GENITALIA:  External genitalia normal External urethra normal Vaginal vault normal without discharge or lesions Cervix normal without discharge or lesions Bimanual exam normal without masses RECTAL: no rectal masses or hemorrhoids Musculoskeletal: Normal range of motion bilateral UE and LE, no joint effusions. Peripheral vascular: Bilateral LE no edema. Neurological: CN II-XII grossly intact with no focal deficits. Alert and oriented to person, place, and time. Coordination, balance, strength, speech and gait are normal.  Skin: Skin is warm and dry. No rash noted. No erythema.  Psychiatric: Patient has a stable nervous mood and affect. Behavior is normal in office today. Judgment and thought content normal in office today.   Assessment & Plan  1. Annual physical exam Discussed in detail all recommended preventative measures appropriate for age and gender now and in the future.   2. Encounter  for screening mammogram for malignant neoplasm of breast Overdue.  - MM Digital Screening; Future  3. Encounter for screening for malignant neoplasm of cervix  - Pap IG and HPV (high risk) DNA detection  4. HLD (hyperlipidemia) Needs to start back on statin, Lipid panel high.  - rosuvastatin (CRESTOR) 20 MG tablet; Take 1 tablet (20 mg total) by mouth daily.  Dispense: 90 tablet; Refill: 1  5. Acute maxillary sinusitis, recurrence not specified  - azithromycin (ZITHROMAX) 250 MG tablet; Take 2 tab po day 1 then 1 tab po q day for 4 more days, Zpak  Dispense: 6 tablet; Refill: 0 - predniSONE (STERAPRED UNI-PAK 21 TAB) 10 MG (21) TBPK tablet; Use as directed in a 6 day taper PredPak  Dispense: 21 tablet; Refill: 0  6. Need for immunization against influenza  - Flu Vaccine QUAD 36+ mos PF IM (Fluarix & Fluzone Quad PF)

## 2015-04-03 ENCOUNTER — Encounter: Payer: Self-pay | Admitting: Family Medicine

## 2015-04-03 LAB — PAP IG AND HPV HIGH-RISK
HPV, high-risk: NEGATIVE
PAP Smear Comment: 0

## 2015-04-12 ENCOUNTER — Telehealth: Payer: Self-pay

## 2015-04-12 ENCOUNTER — Other Ambulatory Visit: Payer: Self-pay | Admitting: Family Medicine

## 2015-04-12 DIAGNOSIS — R519 Headache, unspecified: Secondary | ICD-10-CM | POA: Insufficient documentation

## 2015-04-12 DIAGNOSIS — J0100 Acute maxillary sinusitis, unspecified: Secondary | ICD-10-CM

## 2015-04-12 DIAGNOSIS — R51 Headache: Secondary | ICD-10-CM

## 2015-04-12 MED ORDER — AMOXICILLIN-POT CLAVULANATE 875-125 MG PO TABS: 1.0000 | ORAL_TABLET | Freq: Two times a day (BID) | ORAL | Status: DC

## 2015-04-12 NOTE — Telephone Encounter (Signed)
Patient is requesting a stronger antibiotic. She stated that the z-pack has done nothing for her. She stated that she has been up since 3am and wants some relief. She also wants the dosage of the Fioricet to be increased. She stated she was only to take 1/day but is having to take 2 due to the headaches coming from her pinched nerve.   Patient was informed that Dr. Nadine Counts is out of the office but that I will send her a message and as soon as she responds I will give her a call back.

## 2015-04-12 NOTE — Telephone Encounter (Signed)
1) Fioricet is only made in one dosage. It can be used as 1-2 tablets every 4-6 hours as needed, not to exceed 6 tablets in 24hrs. If she is having headaches every day and running through the Rx for #30 with 5 refills then this is not normal and we should discuss other alternatives in an office visit.  2) A new antibiotic sent to pharmacy. If symptoms persist despite this then her symptoms may be due to allergies or viral syndrome.

## 2015-04-13 NOTE — Telephone Encounter (Signed)
Patient was informed of Dr. Allie Dimmer message via voicemail and was told to give [place] a call if she had any additional concerns or questions.

## 2015-04-16 ENCOUNTER — Emergency Department: Payer: Medicare Other

## 2015-04-16 ENCOUNTER — Encounter: Payer: Self-pay | Admitting: Emergency Medicine

## 2015-04-16 ENCOUNTER — Emergency Department
Admission: EM | Admit: 2015-04-16 | Discharge: 2015-04-16 | Disposition: A | Discharge status: Home / Self Care | Payer: Medicare Other | Attending: Emergency Medicine | Admitting: Emergency Medicine

## 2015-04-16 DIAGNOSIS — W01118A Fall on same level from slipping, tripping and stumbling with subsequent striking against other sharp object, initial encounter: Secondary | ICD-10-CM | POA: Diagnosis not present

## 2015-04-16 DIAGNOSIS — Y9289 Other specified places as the place of occurrence of the external cause: Secondary | ICD-10-CM | POA: Insufficient documentation

## 2015-04-16 DIAGNOSIS — Z792 Long term (current) use of antibiotics: Secondary | ICD-10-CM | POA: Insufficient documentation

## 2015-04-16 DIAGNOSIS — I10 Essential (primary) hypertension: Secondary | ICD-10-CM | POA: Diagnosis not present

## 2015-04-16 DIAGNOSIS — Z7951 Long term (current) use of inhaled steroids: Secondary | ICD-10-CM | POA: Insufficient documentation

## 2015-04-16 DIAGNOSIS — Y998 Other external cause status: Secondary | ICD-10-CM | POA: Insufficient documentation

## 2015-04-16 DIAGNOSIS — Z79899 Other long term (current) drug therapy: Secondary | ICD-10-CM | POA: Insufficient documentation

## 2015-04-16 DIAGNOSIS — Z7982 Long term (current) use of aspirin: Secondary | ICD-10-CM | POA: Diagnosis not present

## 2015-04-16 DIAGNOSIS — S300XXA Contusion of lower back and pelvis, initial encounter: Secondary | ICD-10-CM | POA: Insufficient documentation

## 2015-04-16 DIAGNOSIS — S301XXA Contusion of abdominal wall, initial encounter: Secondary | ICD-10-CM | POA: Insufficient documentation

## 2015-04-16 DIAGNOSIS — Z87891 Personal history of nicotine dependence: Secondary | ICD-10-CM | POA: Diagnosis not present

## 2015-04-16 DIAGNOSIS — Y9301 Activity, walking, marching and hiking: Secondary | ICD-10-CM | POA: Insufficient documentation

## 2015-04-16 DIAGNOSIS — S3992XA Unspecified injury of lower back, initial encounter: Secondary | ICD-10-CM | POA: Diagnosis present

## 2015-04-16 DIAGNOSIS — M545 Low back pain: Secondary | ICD-10-CM | POA: Diagnosis not present

## 2015-04-16 MED ORDER — HYDROCODONE-ACETAMINOPHEN 5-325 MG PO TABS
2.0000 | ORAL_TABLET | Freq: Once | ORAL | Status: AC
  Administered 2015-04-16: 2 via ORAL
  Filled 2015-04-16: qty 2

## 2015-04-16 MED ORDER — CYCLOBENZAPRINE HCL 5 MG PO TABS: 5.0000 mg | ORAL_TABLET | Freq: Three times a day (TID) | ORAL | Status: DC | PRN

## 2015-04-16 MED ORDER — KETOROLAC TROMETHAMINE 30 MG/ML IJ SOLN
60.0000 mg | Freq: Once | INTRAMUSCULAR | Status: AC
  Administered 2015-04-16: 60 mg via INTRAMUSCULAR
  Filled 2015-04-16: qty 2

## 2015-04-16 MED ORDER — NAPROXEN 500 MG PO TABS: 500.0000 mg | ORAL_TABLET | Freq: Two times a day (BID) | ORAL | Status: DC

## 2015-04-16 MED ORDER — HYDROCODONE-ACETAMINOPHEN 5-325 MG PO TABS: 1.0000 | ORAL_TABLET | ORAL | Status: DC | PRN

## 2015-04-16 NOTE — ED Provider Notes (Signed)
[facility] Emergency Department Provider Note  ____________________________________________  Time seen: Approximately 9:28 AM  I have reviewed the triage vital signs and the nursing notes.   HISTORY  Chief Complaint Back Pain and Fall   HPI Linda Mejia is a 60 y.o. female *presents with complaints of left-sided back pain after falling yesterday. Patient states that she was walking her dog dog tripped and she fell backwards hitting used. Denies any loss consciousness or head injury. Complains mainly of low back pain.   Past Medical History  Diagnosis Date  . Anxiety   . Coronary artery disease   . Hypertension   . ADHD (attention deficit hyperactivity disorder)   . Chronic back pain   . History of stomach ulcers     x7 this year. Bleeding  . Herpes genitalis in women     Patient Active Problem List   Diagnosis Date Noted  . Headache 04/12/2015  . Annual physical exam 03/26/2015  . Encounter for screening mammogram for malignant neoplasm of breast 03/26/2015  . Encounter for screening for malignant neoplasm of cervix 03/26/2015  . Acute maxillary sinusitis 03/26/2015  . Need for immunization against influenza 03/26/2015  . Subclinical hypothyroidism 03/26/2015  . Herpes simplex type 2 infection 03/21/2015  . Rhinitis, allergic 03/21/2015  . Screening for STD (sexually transmitted disease) 03/21/2015  . Cervical pain 09/21/2014  . ADD (attention deficit hyperactivity disorder, inattentive type) 09/20/2014  . Episodic paroxysmal anxiety disorder 09/20/2014  . History of gastric ulcer 09/20/2014  . Abuse, drug or alcohol 09/20/2014  . Breathlessness on exertion 08/28/2014  . HLD (hyperlipidemia) 10/03/2009  . Coronary artery disease involving native coronary artery of native heart with angina pectoris ([place]) 10/02/2009  . Hypertension goal BP (blood pressure) < 140/90 10/02/2009    Past Surgical History  Procedure Laterality Date  . Cesarean  section    . Tubal ligation    . Cardiac catheterization    . Tonsillectomy      Current Outpatient Rx  Name  Route  Sig  Dispense  Refill  . acyclovir (ZOVIRAX) 400 MG tablet   Oral   Take 1 tablet by mouth 2 (two) times daily as needed.         Marland Kitchen amoxicillin-clavulanate (AUGMENTIN) 875-125 MG tablet   Oral   Take 1 tablet by mouth 2 (two) times daily.   20 tablet   0   . amphetamine-dextroamphetamine (ADDERALL) 30 MG tablet   Oral   Take 30 mg by mouth 2 (two) times daily.         Marland Kitchen aspirin EC 81 MG tablet   Oral   Take 81 mg by mouth daily.         . butalbital-acetaminophen-caffeine (FIORICET) 50-325-40 MG tablet   Oral   Take 1 tablet by mouth every 6 (six) hours as needed for headache.   30 tablet   5   . cyclobenzaprine (FLEXERIL) 5 MG tablet   Oral   Take 1 tablet (5 mg total) by mouth every 8 (eight) hours as needed for muscle spasms.   30 tablet   0   . diazepam (VALIUM) 10 MG tablet   Oral   Take 1 tablet by mouth 3 (three) times daily.         . fluticasone (FLONASE) 50 MCG/ACT nasal spray   Each Nare   Place 2 sprays into both nostrils daily.   16 g   5   . HYDROcodone-acetaminophen (NORCO) 5-325 MG  tablet   Oral   Take 1-2 tablets by mouth every 4 (four) hours as needed for moderate pain.   15 tablet   0   . metoprolol (LOPRESSOR) 50 MG tablet   Oral   Take 1 tablet (50 mg total) by mouth 2 (two) times daily.   60 tablet   5   . naproxen (NAPROSYN) 500 MG tablet   Oral   Take 1 tablet (500 mg total) by mouth 2 (two) times daily with a meal.   60 tablet   0   . omeprazole (PRILOSEC) 20 MG capsule   Oral   Take 1 capsule (20 mg total) by mouth 2 (two) times daily before a meal.   60 capsule   5   . predniSONE (STERAPRED UNI-PAK 21 TAB) 10 MG (21) TBPK tablet      Use as directed in a 6 day taper PredPak   21 tablet   0   . rosuvastatin (CRESTOR) 20 MG tablet   Oral   Take 1 tablet (20 mg total) by mouth daily.   90  tablet   1   . zolpidem (AMBIEN) 10 MG tablet   Oral   Take 10 mg by mouth at bedtime as needed.             Allergies Review of patient's allergies indicates no known allergies.  Family History  Problem Relation Age of Onset  . Diabetes Mother   . Heart disease Father     Social History Social History  Substance Use Topics  . Smoking status: Former Smoker -- 1.00 packs/day for 30 years  . Smokeless tobacco: None  . Alcohol Use: Yes    Review of Systems Constitutional: No fever/chills Eyes: No visual changes. ENT: No sore throat. Cardiovascular: Denies chest pain. Respiratory: Denies shortness of breath. Gastrointestinal: No abdominal pain.  No nausea, no vomiting.  No diarrhea.  No constipation. Genitourinary: Negative for dysuria. Musculoskeletal: Positive for left lower back/flank pain Skin: Negative for rash. Neurological: Negative for headaches, focal weakness or numbness.  10-point ROS otherwise negative.  ____________________________________________   PHYSICAL EXAM:  VITAL SIGNS: ED Triage Vitals  Enc Vitals Group     BP --      Pulse --      Resp --      Temp --      Temp src --      SpO2 --      Weight --      Height --      Head Cir --      Peak Flow --      Pain Score 04/16/15 0924 8     Pain Loc --      Pain Edu? --      Excl. in [place]? --     Constitutional: Alert and oriented. Well appearing and in no acute distress.  Cardiovascular: Normal rate, regular rhythm. Grossly normal heart sounds.  Good peripheral circulation. Respiratory: Normal respiratory effort.  No retractions. Lungs CTAB. Musculoskeletal: No lower extremity tenderness nor edema.  No joint effusions. Neurologic:  Normal speech and language. No gross focal neurologic deficits are appreciated. No gait instability. Skin:  Skin is warm, dry and intact. No rash noted. Ecchymosis noted approximately 2.5 x 6 cm on the left flank to the paraspinal lumbar muscle Psychiatric:  Mood and affect are normal. Speech and behavior are normal.  ____________________________________________   LABS (all labs ordered are listed, but only abnormal results are displayed)  Labs Reviewed - No data to display ____________________________________________  RADIOLOGY  Negative for lumbar fracture. ____________________________________________   PROCEDURES  Procedure(s) performed: None  Critical Care performed: No  ____________________________________________   INITIAL IMPRESSION / ASSESSMENT AND PLAN / ED COURSE  Pertinent labs & imaging results that were available during my care of the patient were reviewed by me and considered in my medical decision making (see chart for details).  Status post fall with acute lumbar contusion. Rx given for Vicodin 5/25 and Naprosyn 500 mg twice a day. Patient to follow up PCP or return to ER with any worsening symptomology. ____________________________________________   FINAL CLINICAL IMPRESSION(S) / ED DIAGNOSES  Final diagnoses:  Contusion of lower back, initial encounter      Arlyss Repress, PA-C 04/16/15 1059  Delman Kitten, MD 04/16/15 [phone]

## 2015-04-16 NOTE — Discharge Instructions (Signed)

## 2015-04-16 NOTE — ED Notes (Signed)
Pt to ed with c/o left side back pain after fall yesterday,  Pt states she was walking a dog and the dog tripped her and she fell backwards and hit a stool.

## 2015-05-09 ENCOUNTER — Encounter: Payer: Self-pay | Admitting: Family Medicine

## 2015-05-09 ENCOUNTER — Ambulatory Visit (INDEPENDENT_AMBULATORY_CARE_PROVIDER_SITE_OTHER): Payer: Medicare Other | Admitting: Family Medicine

## 2015-05-09 VITALS — BP 136/82 | HR 71 | Temp 98.0°F | Resp 16 | Ht 65.0 in | Wt 180.4 lb

## 2015-05-09 DIAGNOSIS — J40 Bronchitis, not specified as acute or chronic: Secondary | ICD-10-CM

## 2015-05-09 DIAGNOSIS — J209 Acute bronchitis, unspecified: Secondary | ICD-10-CM

## 2015-05-09 MED ORDER — LEVOFLOXACIN 750 MG PO TABS: 750.0000 mg | ORAL_TABLET | Freq: Every day | ORAL | Status: DC

## 2015-05-09 MED ORDER — HYDROCOD POLST-CPM POLST ER 10-8 MG/5ML PO SUER: 5.0000 mL | Freq: Two times a day (BID) | ORAL | Status: DC | PRN

## 2015-05-09 NOTE — Patient Instructions (Signed)

## 2015-05-09 NOTE — Progress Notes (Signed)
Name: Linda Mejia   MRN: [id number]    DOB: 12-13-1955   Date:05/09/2015       Progress Note  Subjective  Chief Complaint  Chief Complaint  Patient presents with  . URI    HPI  Patient is here today with concerns regarding the following symptoms sore throat, congestion, sneezing and productive cough that started 3 weeks ago.  Has pictures of 1/4 cup of yellow thick mucous she has been coughing up. Former smoker. Associated with chills, sweats and fatigue. Not associated with fever. Has tried the following home remedies: no    Past Medical History  Diagnosis Date  . Anxiety   . Coronary artery disease   . Hypertension   . ADHD (attention deficit hyperactivity disorder)   . Chronic back pain   . History of stomach ulcers     x7 this year. Bleeding  . Herpes genitalis in women     Social History  Substance Use Topics  . Smoking status: Former Smoker -- 1.00 packs/day for 30 years  . Smokeless tobacco: Not on file  . Alcohol Use: Yes     Current outpatient prescriptions:  .  acyclovir (ZOVIRAX) 400 MG tablet, Take 1 tablet by mouth 2 (two) times daily as needed., Disp: , Rfl:  .  amoxicillin-clavulanate (AUGMENTIN) 875-125 MG tablet, Take 1 tablet by mouth 2 (two) times daily., Disp: 20 tablet, Rfl: 0 .  amphetamine-dextroamphetamine (ADDERALL) 30 MG tablet, Take 30 mg by mouth 2 (two) times daily., Disp: , Rfl:  .  aspirin EC 81 MG tablet, Take 81 mg by mouth daily., Disp: , Rfl:  .  butalbital-acetaminophen-caffeine (FIORICET) 50-325-40 MG tablet, Take 1 tablet by mouth every 6 (six) hours as needed for headache., Disp: 30 tablet, Rfl: 5 .  cyclobenzaprine (FLEXERIL) 5 MG tablet, Take 1 tablet (5 mg total) by mouth every 8 (eight) hours as needed for muscle spasms., Disp: 30 tablet, Rfl: 0 .  diazepam (VALIUM) 10 MG tablet, Take 1 tablet by mouth 3 (three) times daily., Disp: , Rfl:  .  fluticasone (FLONASE) 50 MCG/ACT nasal spray, Place 2 sprays into both nostrils daily.,  Disp: 16 g, Rfl: 5 .  HYDROcodone-acetaminophen (NORCO) 5-325 MG tablet, Take 1-2 tablets by mouth every 4 (four) hours as needed for moderate pain., Disp: 15 tablet, Rfl: 0 .  metoprolol (LOPRESSOR) 50 MG tablet, Take 1 tablet (50 mg total) by mouth 2 (two) times daily., Disp: 60 tablet, Rfl: 5 .  naproxen (NAPROSYN) 500 MG tablet, Take 1 tablet (500 mg total) by mouth 2 (two) times daily with a meal., Disp: 60 tablet, Rfl: 0 .  omeprazole (PRILOSEC) 20 MG capsule, Take 1 capsule (20 mg total) by mouth 2 (two) times daily before a meal., Disp: 60 capsule, Rfl: 5 .  predniSONE (STERAPRED UNI-PAK 21 TAB) 10 MG (21) TBPK tablet, Use as directed in a 6 day taper PredPak, Disp: 21 tablet, Rfl: 0 .  rosuvastatin (CRESTOR) 20 MG tablet, Take 1 tablet (20 mg total) by mouth daily., Disp: 90 tablet, Rfl: 1 .  zolpidem (AMBIEN) 10 MG tablet, Take 10 mg by mouth at bedtime as needed.  , Disp: , Rfl:   No Known Allergies  ROS  Positive for fatigue, nasal congestion, sinus pressure, ear fullness, cough as mentioned in HPI, otherwise all systems reviewed and are negative.  Objective  Filed Vitals:   05/09/15 1546  BP: 136/82  Pulse: 71  Temp: 98 F (36.7 C)  TempSrc: Oral  Resp: 16  Height: 5\' 5"  (1.651 m)  Weight: 180 lb 6.4 oz (81.829 kg)  SpO2: 94%   Body mass index is 30.02 kg/(m^2).   Physical Exam  Constitutional: Patient appears well-developed and well-nourished. In no acute distress but does appear to be fatigued from acute illness. HEENT:  - Head: Normocephalic and atraumatic.  - Ears: RIGHT TM bulging with minimal clear exudate, LEFT TM bulging with minimal clear exudate.  - Nose: Nasal mucosa boggy and congested.  - Mouth/Throat: Oropharynx is moist with slight erythema of bilateral tonsils without hypertrophy or exudates. Post nasal drainage present.  - Eyes: Conjunctivae clear, EOM movements normal. PERRLA. No scleral icterus.  Neck: Normal range of motion. Neck supple. No  JVD present. No thyromegaly present. No local lymphadenopathy. Cardiovascular: Regular rate, regular rhythm with no murmurs heard.  Pulmonary/Chest: Effort normal and breath sounds reduced tidal volume and scattered rhonchi upper anterior airways. Musculoskeletal: Normal range of motion bilateral UE and LE, no joint effusions. Skin: Skin is warm and dry. No rash noted. Psychiatric: Patient has a normal mood and affect. Behavior is normal in office today. Judgment and thought content normal in office today.   Assessment & Plan  1. Bronchitis with bronchospasm Etiologies include initial allergic rhinitis or viral infection progressing to superimposed bacterial infection. Instructed patient on increasing hydration, nasal saline spray, steam inhalation, NSAID if tolerated and not contraindicated. Start abx.  - levofloxacin (LEVAQUIN) 750 MG tablet; Take 1 tablet (750 mg total) by mouth daily.  Dispense: 10 tablet; Refill: 0 - chlorpheniramine-HYDROcodone (TUSSIONEX PENNKINETIC ER) 10-8 MG/5ML SUER; Take 5 mLs by mouth every 12 (twelve) hours as needed.  Dispense: 115 mL; Refill: 0

## 2015-05-25 ENCOUNTER — Encounter: Payer: Self-pay | Admitting: Family Medicine

## 2015-05-31 ENCOUNTER — Emergency Department: Payer: Medicare Other

## 2015-05-31 ENCOUNTER — Other Ambulatory Visit: Payer: Self-pay

## 2015-05-31 ENCOUNTER — Encounter: Payer: Self-pay | Admitting: Emergency Medicine

## 2015-05-31 ENCOUNTER — Emergency Department
Admission: EM | Admit: 2015-05-31 | Discharge: 2015-05-31 | Disposition: A | Discharge status: Home / Self Care | Payer: Medicare Other | Attending: Emergency Medicine | Admitting: Emergency Medicine

## 2015-05-31 DIAGNOSIS — M25512 Pain in left shoulder: Secondary | ICD-10-CM | POA: Insufficient documentation

## 2015-05-31 DIAGNOSIS — M549 Dorsalgia, unspecified: Secondary | ICD-10-CM | POA: Insufficient documentation

## 2015-05-31 DIAGNOSIS — Z87891 Personal history of nicotine dependence: Secondary | ICD-10-CM | POA: Diagnosis not present

## 2015-05-31 DIAGNOSIS — Z791 Long term (current) use of non-steroidal anti-inflammatories (NSAID): Secondary | ICD-10-CM | POA: Diagnosis not present

## 2015-05-31 DIAGNOSIS — M25511 Pain in right shoulder: Secondary | ICD-10-CM | POA: Insufficient documentation

## 2015-05-31 DIAGNOSIS — Z7982 Long term (current) use of aspirin: Secondary | ICD-10-CM | POA: Insufficient documentation

## 2015-05-31 DIAGNOSIS — I10 Essential (primary) hypertension: Secondary | ICD-10-CM | POA: Diagnosis not present

## 2015-05-31 DIAGNOSIS — M25519 Pain in unspecified shoulder: Secondary | ICD-10-CM

## 2015-05-31 DIAGNOSIS — Z7952 Long term (current) use of systemic steroids: Secondary | ICD-10-CM | POA: Insufficient documentation

## 2015-05-31 DIAGNOSIS — R531 Weakness: Secondary | ICD-10-CM | POA: Insufficient documentation

## 2015-05-31 DIAGNOSIS — R52 Pain, unspecified: Secondary | ICD-10-CM | POA: Diagnosis present

## 2015-05-31 DIAGNOSIS — Z792 Long term (current) use of antibiotics: Secondary | ICD-10-CM | POA: Diagnosis not present

## 2015-05-31 DIAGNOSIS — M542 Cervicalgia: Secondary | ICD-10-CM | POA: Insufficient documentation

## 2015-05-31 DIAGNOSIS — R0602 Shortness of breath: Secondary | ICD-10-CM | POA: Diagnosis not present

## 2015-05-31 LAB — BASIC METABOLIC PANEL
Anion gap: 9 (ref 5–15)
BUN: 16 mg/dL (ref 6–20)
CO2: 29 mmol/L (ref 22–32)
Calcium: 9.3 mg/dL (ref 8.9–10.3)
Chloride: 100 mmol/L — ABNORMAL LOW (ref 101–111)
Creatinine, Ser: 0.7 mg/dL (ref 0.44–1.00)
GFR calc Af Amer: >60 mL/min (ref >60)
GFR calc non Af Amer: >60 mL/min (ref >60)
Glucose, Bld: 104 mg/dL — ABNORMAL HIGH (ref 65–99)
Potassium: 4.1 mmol/L (ref 3.5–5.1)
Sodium: 138 mmol/L (ref 135–145)

## 2015-05-31 LAB — TROPONIN I: Troponin I: <0.03 ng/mL (ref <0.031)

## 2015-05-31 LAB — CBC
HCT: 41.2 % (ref 35.0–47.0)
Hemoglobin: 13.7 g/dL (ref 12.0–16.0)
MCH: 30.7 pg (ref 26.0–34.0)
MCHC: 33.3 g/dL (ref 32.0–36.0)
MCV: 92.4 fL (ref 80.0–100.0)
Platelets: 406 10*3/uL (ref 150–440)
RBC: 4.46 MIL/uL (ref 3.80–5.20)
RDW: 13.6 % (ref 11.5–14.5)
WBC: 5.3 10*3/uL (ref 3.6–11.0)

## 2015-05-31 LAB — SEDIMENTATION RATE: Sed Rate: 27 mm/hr (ref 0–30)

## 2015-05-31 MED ORDER — SODIUM CHLORIDE 0.9 % IV BOLUS (SEPSIS)
1000.0000 mL | Freq: Once | INTRAVENOUS | Status: AC
Start: 2015-05-31 — End: 2015-05-31
  Administered 2015-05-31: 1000 mL via INTRAVENOUS

## 2015-05-31 NOTE — ED Notes (Signed)
Pt here with c/o bilateral shoulder pain, bilateral leg pain, and cp. Tearful in triage, states she has been feeling very weak at home and unable to get her normal chores done. Had 100% blockage last year and had 2 stents placed. Denies si. Daughter states this is not like her mom, she is normally very active.

## 2015-05-31 NOTE — ED Notes (Signed)
Patient ready to leave, called daughter. IV removed and she was asked to wait for discharge instructions. Verbally went over discharge instructions and after care with PCP. Patient left while papers were being printed.

## 2015-05-31 NOTE — ED Provider Notes (Addendum)
[facility] Emergency Department Provider Note   ____________________________________________  Time seen: ~1215  I have reviewed the triage vital signs and the nursing notes.   HISTORY  Chief Complaint Generalized Body Aches and Chest Pain   History limited by: Not Limited   HPI Linda Mejia is a 60 y.o. female who presents to the emergency department today because of concerns for bilateral shoulder pain and full body pain. She states this is been going on for 1 week. She denies any trauma or unusual activity a week ago that started these symptoms. She states the pain will become severe. It will wake her out of bed. She states that additionally she has felt like she has not had any energy. She does state that when she tries to move around it makes the pain worse. She feels like she should be having fevers although she has not had any measured fevers. She denies any similar symptoms in the past. This does not reminder of the time she had her heart attacks.     Past Medical History  Diagnosis Date   Anxiety    Coronary artery disease    Hypertension    ADHD (attention deficit hyperactivity disorder)    Chronic back pain    History of stomach ulcers     x7 this year. Bleeding   Herpes genitalis in women     Patient Active Problem List   Diagnosis Date Noted   Headache 04/12/2015   Subclinical hypothyroidism 03/26/2015   Herpes simplex type 2 infection 03/21/2015   Rhinitis, allergic 03/21/2015   Cervical pain 09/21/2014   ADD (attention deficit hyperactivity disorder, inattentive type) 09/20/2014   Episodic paroxysmal anxiety disorder 09/20/2014   History of gastric ulcer 09/20/2014       Breathlessness on exertion 08/28/2014   HLD (hyperlipidemia) 10/03/2009   Coronary artery disease involving native coronary artery of native heart with angina pectoris (HCC) 10/02/2009   Hypertension goal BP (blood pressure) < 140/90 10/02/2009   Past Surgical  History  Procedure Laterality Date   Cesarean section     Tubal ligation     Cardiac catheterization     Tonsillectomy      Current Outpatient Rx  Name  Route  Sig  Dispense  Refill   acyclovir (ZOVIRAX) 400 MG tablet   Oral   Take 1 tablet by mouth 2 (two) times daily as needed.          amoxicillin-clavulanate (AUGMENTIN) 875-125 MG tablet   Oral   Take 1 tablet by mouth 2 (two) times daily.   20 tablet   0    amphetamine-dextroamphetamine (ADDERALL) 30 MG tablet   Oral   Take 30 mg by mouth 2 (two) times daily.          aspirin EC 81 MG tablet   Oral   Take 81 mg by mouth daily.          butalbital-acetaminophen-caffeine (FIORICET) 50-325-40 MG tablet   Oral   Take 1 tablet by mouth every 6 (six) hours as needed for headache.   30 tablet   5    chlorpheniramine-HYDROcodone (TUSSIONEX PENNKINETIC ER) 10-8 MG/5ML SUER   Oral   Take 5 mLs by mouth every 12 (twelve) hours as needed.   115 mL   0    cyclobenzaprine (FLEXERIL) 5 MG tablet   Oral   Take 1 tablet (5 mg total) by mouth every 8 (eight) hours as needed for muscle spasms.  30 tablet   0    diazepam (VALIUM) 10 MG tablet   Oral   Take 1 tablet by mouth 3 (three) times daily.          fluticasone (FLONASE) 50 MCG/ACT nasal spray   Each Nare   Place 2 sprays into both nostrils daily.   16 g   5    HYDROcodone-acetaminophen (NORCO) 5-325 MG tablet   Oral   Take 1-2 tablets by mouth every 4 (four) hours as needed for moderate pain.   15 tablet   0    levofloxacin (LEVAQUIN) 750 MG tablet   Oral   Take 1 tablet (750 mg total) by mouth daily.   10 tablet   0    metoprolol (LOPRESSOR) 50 MG tablet   Oral   Take 1 tablet (50 mg total) by mouth 2 (two) times daily.   60 tablet   5    naproxen (NAPROSYN) 500 MG tablet   Oral   Take 1 tablet (500 mg total) by mouth 2 (two) times daily with a meal.   60 tablet   0    omeprazole (PRILOSEC) 20 MG capsule   Oral   Take 1 capsule (20  mg total) by mouth 2 (two) times daily before a meal.   60 capsule   5    predniSONE (STERAPRED UNI-PAK 21 TAB) 10 MG (21) TBPK tablet      Use as directed in a 6 day taper PredPak   21 tablet   0    rosuvastatin (CRESTOR) 20 MG tablet   Oral   Take 1 tablet (20 mg total) by mouth daily.   90 tablet   1    zolpidem (AMBIEN) 10 MG tablet   Oral   Take 10 mg by mouth at bedtime as needed.             Allergies Review of patient's allergies indicates no known allergies.  Family History  Problem Relation Age of Onset   Diabetes Mother    Heart disease Father     Social History Social History  Substance Use Topics   Smoking status: Former Smoker -- 1.00 packs/day for 30 years   Smokeless tobacco: None   Alcohol Use: Yes    Review of Systems  Constitutional: Negative for fever. Cardiovascular: Negative for chest pain. Respiratory: Negative for shortness of breath. Gastrointestinal: Negative for abdominal pain, vomiting and diarrhea. Genitourinary: Negative for dysuria. Musculoskeletal: Positive for back pain. Positive for bilateral shoulder pain Skin: Negative for rash. Neurological: Negative for headaches, focal weakness or numbness.   10-point ROS otherwise negative.  ____________________________________________   PHYSICAL EXAM:  VITAL SIGNS: ED Triage Vitals  Enc Vitals Group     BP 05/31/15 1031 179/103 mmHg     Pulse Rate 05/31/15 1031 110     Resp 05/31/15 1031 18     Temp 05/31/15 1031 98.1 F (36.7 C)     Temp Source 05/31/15 1031 Oral     SpO2 05/31/15 1031 95 %     Weight 05/31/15 1031 170 lb (77.111 kg)     Height 05/31/15 1031 5\' 5"  (1.651 m)     Head Cir --      Peak Flow --      Pain Score 05/31/15 1042 4   Constitutional: Alert and oriented. Well appearing and in no distress. Eyes: Conjunctivae are normal. PERRL. Normal extraocular movements. ENT      Head: Normocephalic and atraumatic.  Nose: No congestion/rhinnorhea.       Mouth/Throat: Mucous membranes are moist.      Neck: No stridor. Hematological/Lymphatic/Immunilogical: No cervical lymphadenopathy. Cardiovascular: Normal rate, regular rhythm.  No murmurs, rubs, or gallops. Respiratory: Normal respiratory effort without tachypnea nor retractions. Breath sounds are clear and equal bilaterally. No wheezes/rales/rhonchi. Gastrointestinal: Soft and nontender. No distention.  Genitourinary: Deferred Musculoskeletal: Pain with attempted range of motion of bilateral shoulders. Pain with palpation of the upper neck. Neurologic:  Normal speech and language. No gross focal neurologic deficits are appreciated.  Skin:  Skin is warm, dry and intact. No rash noted. Psychiatric: Mood and affect are normal. Speech and behavior are normal. Patient exhibits appropriate insight and judgment.  ____________________________________________    LABS (pertinent positives/negatives)  Labs Reviewed  BASIC METABOLIC PANEL - Abnormal; Notable for the following:    Chloride 100 (*)    Glucose, Bld 104 (*)    All other components within normal limits  CBC  TROPONIN I  SEDIMENTATION RATE     ____________________________________________   EKG  I, Phineas Semen, attending physician, personally viewed and interpreted this EKG  EKG Time: 1042 Rate: 102 Rhythm: sinus tachycardia Axis: normal Intervals: qtc 443 QRS: narrow ST changes: no st elevation Impression: sinus tachycardia, otherwise normal ekg   ____________________________________________    RADIOLOGY  CXR IMPRESSION: COPD changes.  No acute abnormalities.  ____________________________________________   PROCEDURES  Procedure(s) performed: None  Critical Care performed: No  ____________________________________________   INITIAL IMPRESSION / ASSESSMENT AND PLAN / ED COURSE  Pertinent labs & imaging results that were available during my care of the patient were reviewed by me and considered  in my medical decision making (see chart for details).   Resented to the emergency department today because of concerns for bilateral shoulder pain and body pain.  She was somewhat tachycardic upon arrival however tachycardia did resolve after fluid bolus. Patient's blood work without any concerning findings. On exam patient did have pain with ROM of bilateral upper extremities, shoulders were without any obvious deformities. Doubt septic joints. Think viral a possibility given associated body aches and weakness, no fever here however. Doubt odd presentation of acs given EKG and normal troponin and length of symptoms.   Patient left ER before she could be given paperwork.  ____________________________________________   FINAL CLINICAL IMPRESSION(S) / ED DIAGNOSES  Final diagnoses:  Shoulder joint pain, unspecified laterality  Weakness     Phineas Semen, MD 05/31/15 1331  Addendum: Patient requested that "Abuse, drug or alcohol Date noted 09/20/2014" be removed. It was removed from Patient Active Problem List.   Phineas Semen, MD 08/19/21 1527

## 2015-05-31 NOTE — Discharge Instructions (Signed)
Please seek medical attention for any high fevers, chest pain, shortness of breath, change in behavior, persistent vomiting, bloody stool or any other new or concerning symptoms.   Joint Pain Joint pain can be caused by many things. The joint can be bruised, infected, weak from aging, or sore from exercise. The pain will probably go away if you follow your doctor's instructions for home care. If your joint pain continues, more tests may be needed to help find the cause of your condition. HOME CARE Watch your condition for any changes. Follow these instructions as told to lessen the pain that you are feeling:  Take medicines only as told by your doctor.  Rest the sore joint for as long as told by your doctor. If your doctor tells you to, raise (elevate) the painful joint above the level of your heart while you are sitting or lying down.  Do not do things that cause pain or make the pain worse.  If told, put ice on the painful area:  Put ice in a plastic bag.  Place a towel between your skin and the bag.  Leave the ice on for 20 minutes, 2-3 times per day.  Wear an elastic bandage, splint, or sling as told by your doctor. Loosen the bandage or splint if your fingers or toes lose feeling (become numb) and tingle, or if they turn cold and blue.  Begin exercising or stretching the joint as told by your doctor. Ask your doctor what types of exercise are safe for you.  Keep all follow-up visits as told by your doctor. This is important. GET HELP IF:  Your pain gets worse and medicine does not help it.  Your joint pain does not get better in 3 days.  You have more bruising or swelling.  You have a fever.  You lose 10 pounds (4.5 kg) or more without trying. GET HELP RIGHT AWAY IF:  You are not able to move the joint.  Your fingers or toes become numb or they turn cold and blue.   This information is not intended to replace advice given to you by your health care provider. Make sure  you discuss any questions you have with your health care provider.   Document Released: 03/19/2009 Document Revised: 04/21/2014 Document Reviewed: 01/10/2014 Elsevier Interactive Patient Education [employer].

## 2015-06-05 ENCOUNTER — Encounter: Payer: Self-pay | Admitting: Family Medicine

## 2015-06-05 ENCOUNTER — Ambulatory Visit (INDEPENDENT_AMBULATORY_CARE_PROVIDER_SITE_OTHER): Payer: Medicare Other | Admitting: Family Medicine

## 2015-06-05 VITALS — BP 124/90 | HR 97 | Temp 97.7°F | Resp 16 | Ht 65.0 in | Wt 182.5 lb

## 2015-06-05 DIAGNOSIS — R5383 Other fatigue: Secondary | ICD-10-CM | POA: Diagnosis not present

## 2015-06-05 DIAGNOSIS — M2550 Pain in unspecified joint: Secondary | ICD-10-CM | POA: Diagnosis not present

## 2015-06-05 DIAGNOSIS — R61 Generalized hyperhidrosis: Secondary | ICD-10-CM

## 2015-06-05 DIAGNOSIS — Z111 Encounter for screening for respiratory tuberculosis: Secondary | ICD-10-CM | POA: Diagnosis not present

## 2015-06-05 NOTE — Progress Notes (Signed)
Name: HANAAN GANCARZ   MRN: [id number]    DOB: Aug 26, 1955   Date:06/05/2015       Progress Note  Subjective  Chief Complaint  Chief Complaint  Patient presents with  . Fatigue  . Joint Pain    onset suddenly 2 weeks ago, worsening. Patient hurting in shoulders, hips, knees, and bottoms of feet.    HPI  Ashauna Bertholf is a 60 year old female that complains of generalized pain in joints such as neck, shoulders, hips, knees, legs, bottoms of feet along with fatigue. No change to urinary color. Onset 2 weeks ago. She did have bronchitis prior to that. She reports nausea and vomiting 2 Sundays ago but now resolved. No abdominal pain. Tanea also states she is tired, has night sweats and is moody. Saw her psychologist and he does not feel that her symptoms are mood disorder related. She denies chest pain, palpitations, dark tarry stools.   Past Medical History  Diagnosis Date  . Anxiety   . Coronary artery disease   . Hypertension   . ADHD (attention deficit hyperactivity disorder)   . Chronic back pain   . History of stomach ulcers     x7 this year. Bleeding  . Herpes genitalis in women     Patient Active Problem List   Diagnosis Date Noted  . Headache 04/12/2015  . Subclinical hypothyroidism 03/26/2015  . Herpes simplex type 2 infection 03/21/2015  . Rhinitis, allergic 03/21/2015  . Cervical pain 09/21/2014  . ADD (attention deficit hyperactivity disorder, inattentive type) 09/20/2014  . Episodic paroxysmal anxiety disorder 09/20/2014  . History of gastric ulcer 09/20/2014  . Abuse, drug or alcohol 09/20/2014  . Breathlessness on exertion 08/28/2014  . HLD (hyperlipidemia) 10/03/2009  . Coronary artery disease involving native coronary artery of native heart with angina pectoris ([place]) 10/02/2009  . Hypertension goal BP (blood pressure) < 140/90 10/02/2009    Social History  Substance Use Topics  . Smoking status: Former Smoker -- 1.00 packs/day for 30 years  . Smokeless tobacco: Not on  file  . Alcohol Use: Yes     Current outpatient prescriptions:  .  amphetamine-dextroamphetamine (ADDERALL) 30 MG tablet, Take 30 mg by mouth 2 (two) times daily., Disp: , Rfl:  .  aspirin EC 81 MG tablet, Take 81 mg by mouth daily., Disp: , Rfl:  .  butalbital-acetaminophen-caffeine (FIORICET) 50-325-40 MG tablet, Take 1 tablet by mouth every 6 (six) hours as needed for headache., Disp: 30 tablet, Rfl: 5 .  diazepam (VALIUM) 10 MG tablet, Take 1 tablet by mouth 3 (three) times daily., Disp: , Rfl:  .  fluticasone (FLONASE) 50 MCG/ACT nasal spray, Place 2 sprays into both nostrils daily., Disp: 16 g, Rfl: 5 .  metoprolol (LOPRESSOR) 50 MG tablet, Take 1 tablet (50 mg total) by mouth 2 (two) times daily., Disp: 60 tablet, Rfl: 5 .  omeprazole (PRILOSEC) 20 MG capsule, Take 1 capsule (20 mg total) by mouth 2 (two) times daily before a meal., Disp: 60 capsule, Rfl: 5 .  rosuvastatin (CRESTOR) 20 MG tablet, Take 1 tablet (20 mg total) by mouth daily., Disp: 90 tablet, Rfl: 1 .  zolpidem (AMBIEN) 10 MG tablet, Take 10 mg by mouth at bedtime as needed.  , Disp: , Rfl:  .  acyclovir (ZOVIRAX) 400 MG tablet, Take 1 tablet by mouth 2 (two) times daily as needed. Reported on 06/05/2015, Disp: , Rfl:  .  amoxicillin-clavulanate (AUGMENTIN) 875-125 MG tablet, Take 1 tablet by mouth  2 (two) times daily. (Patient not taking: Reported on 06/05/2015), Disp: 20 tablet, Rfl: 0 .  chlorpheniramine-HYDROcodone (TUSSIONEX PENNKINETIC ER) 10-8 MG/5ML SUER, Take 5 mLs by mouth every 12 (twelve) hours as needed. (Patient not taking: Reported on 06/05/2015), Disp: 115 mL, Rfl: 0 .  cyclobenzaprine (FLEXERIL) 5 MG tablet, Take 1 tablet (5 mg total) by mouth every 8 (eight) hours as needed for muscle spasms. (Patient not taking: Reported on 06/05/2015), Disp: 30 tablet, Rfl: 0 .  HYDROcodone-acetaminophen (NORCO) 5-325 MG tablet, Take 1-2 tablets by mouth every 4 (four) hours as needed for moderate pain. (Patient not taking:  Reported on 06/05/2015), Disp: 15 tablet, Rfl: 0 .  levofloxacin (LEVAQUIN) 750 MG tablet, Take 1 tablet (750 mg total) by mouth daily. (Patient not taking: Reported on 06/05/2015), Disp: 10 tablet, Rfl: 0 .  naproxen (NAPROSYN) 500 MG tablet, Take 1 tablet (500 mg total) by mouth 2 (two) times daily with a meal. (Patient not taking: Reported on 06/05/2015), Disp: 60 tablet, Rfl: 0 .  predniSONE (STERAPRED UNI-PAK 21 TAB) 10 MG (21) TBPK tablet, Use as directed in a 6 day taper PredPak (Patient not taking: Reported on 06/05/2015), Disp: 21 tablet, Rfl: 0  Past Surgical History  Procedure Laterality Date  . Cesarean section    . Tubal ligation    . Cardiac catheterization    . Tonsillectomy      Family History  Problem Relation Age of Onset  . Diabetes Mother   . Heart disease Father     No Known Allergies   Review of Systems  CONSTITUTIONAL: No significant weight changes, fever, chills. Yes weakness or fatigue.  HEENT:  - Eyes: No visual changes.  - Ears: No auditory changes. No pain.  - Nose: No sneezing, congestion, runny nose. - Throat: No sore throat. No changes in swallowing. SKIN: No rash or itching.  CARDIOVASCULAR: No chest pain, chest pressure or chest discomfort. No palpitations or edema.  RESPIRATORY: No shortness of breath, cough or sputum.  GASTROINTESTINAL: No anorexia, nausea, vomiting. No changes in bowel habits. No abdominal pain or blood.  GENITOURINARY: No dysuria. No frequency. No discharge. NEUROLOGICAL: No headache, dizziness, syncope, paralysis, ataxia, numbness or tingling in the extremities. No memory changes. No change in bowel or bladder control.  MUSCULOSKELETAL: Yes joint pain. Yes muscle pain. HEMATOLOGIC: No anemia, bleeding or bruising.  LYMPHATICS: No enlarged lymph nodes.  PSYCHIATRIC: Yes starting to have some change in mood. No change in sleep pattern.  ENDOCRINOLOGIC: Yes reports of sweating, cold or heat intolerance. No polyuria or polydipsia.      Objective  BP 124/90 mmHg  Pulse 97  Temp(Src) 97.7 F (36.5 C) (Oral)  Resp 16  Ht '5\' 5"'  (1.651 m)  Wt 182 lb 8 oz (82.781 kg)  BMI 30.37 kg/m2  SpO2 98% Body mass index is 30.37 kg/(m^2).  Physical Exam  Constitutional: Patient appears well-developed and well-nourished. In no distress.  Neck: Normal range of motion. Neck supple. No JVD present. No thyromegaly present.  Cardiovascular: Normal rate, regular rhythm and normal heart sounds.  No murmur heard.  Pulmonary/Chest: Effort normal and breath sounds normal. No respiratory distress. Musculoskeletal: Normal range of motion bilateral UE and LE, no joint effusions on hands or knees. Peripheral vascular: Bilateral LE no edema. Neurological: CN II-XII grossly intact with no focal deficits. Alert and oriented to person, place, and time. Coordination, balance, strength, speech and gait are normal.  Skin: Skin is warm and dry. No rash noted. No erythema.  Psychiatric: Patient has a disgruntled mood and affect. Behavior is normal in office today. Judgment and thought content normal in office today.   Recent Results (from the past 2160 hour(s))  Hemoglobin A1c     Status: Abnormal   Collection Time: 03/22/15  8:18 AM  Result Value Ref Range   Hgb A1c MFr Bld 5.8 (H) 4.8 - 5.6 %    Comment:          Pre-diabetes: 5.7 - 6.4          Diabetes: >6.4          Glycemic control for adults with diabetes: <7.0    Est. average glucose Bld gHb Est-mCnc 120 mg/dL  Lipid panel     Status: Abnormal   Collection Time: 03/22/15  8:18 AM  Result Value Ref Range   Cholesterol, Total 264 (H) 100 - 199 mg/dL   Triglycerides 65 0 - 149 mg/dL   HDL 75 >39 mg/dL   VLDL Cholesterol Cal 13 5 - 40 mg/dL   LDL Calculated 176 (H) 0 - 99 mg/dL   Chol/HDL Ratio 3.5 0.0 - 4.4 ratio units    Comment:                                   T. Chol/HDL Ratio                                             Men  Women                               1/2 Avg.Risk   3.4    3.3                                   Avg.Risk  5.0    4.4                                2X Avg.Risk  9.6    7.1                                3X Avg.Risk 23.4   11.0   TSH     Status: Abnormal   Collection Time: 03/22/15  8:18 AM  Result Value Ref Range   TSH 4.950 (H) 0.450 - 4.500 uIU/mL  HIV antibody     Status: None   Collection Time: 03/22/15  8:18 AM  Result Value Ref Range   HIV Screen 4th Generation wRfx Non Reactive Non Reactive  HSV 2 antibody, IgG     Status: Abnormal   Collection Time: 03/22/15  8:18 AM  Result Value Ref Range   HSV 2 Glycoprotein G Ab, IgG 2.13 (H) 0.00 - 0.90 index    Comment:                                  Negative        <0.91  Equivocal 0.91 - 1.09                                  Positive        >1.09  Note: Negative indicates no antibodies detected to  HSV-2. Equivocal may suggest early infection.  If  clinically appropriate, retest at later date. Positive  indicates antibodies detected to HSV-2.   RPR     Status: None   Collection Time: 03/22/15  8:18 AM  Result Value Ref Range   RPR Ser Ql Non Reactive Non Reactive  Hepatitis C antibody     Status: None   Collection Time: 03/22/15  8:18 AM  Result Value Ref Range   Hep C Virus Ab 0.1 0.0 - 0.9 s/co ratio    Comment:                                   Negative:     < 0.8                              Indeterminate: 0.8 - 0.9                                   Positive:     > 0.9  The CDC recommends that a positive HCV antibody result  be followed up with a HCV Nucleic Acid Amplification  test (254270).   Hepatitis B surface antibody     Status: None   Collection Time: 03/22/15  8:18 AM  Result Value Ref Range   Hep B Surface Ab, Qual Reactive     Comment:               Non Reactive: Inconsistent with immunity,                             less than 10 mIU/mL               Reactive:     Consistent with immunity,                              greater than 9.9 mIU/mL **Verified by repeat analysis**   Hepatitis A antibody, total     Status: Abnormal   Collection Time: 03/22/15  8:18 AM  Result Value Ref Range   Hep A Total Ab Positive (A) Negative  Pap IG and HPV (high risk) DNA detection     Status: None   Collection Time: 03/26/15 12:00 AM  Result Value Ref Range   DIAGNOSIS: Comment     Comment: NEGATIVE FOR INTRAEPITHELIAL LESION AND MALIGNANCY. THIS SPECIMEN WAS RESCREENED AS PART OF OUR QUALITY CONTROL PROGRAM.    Specimen adequacy: Comment     Comment: Satisfactory for evaluation. Endocervical and/or squamous metaplastic cells (endocervical component) are present.    CLINICIAN PROVIDED ICD10: Comment     Comment: Z12.4   Performed by: Comment     Comment: Migdalia Dk, Supervisory Cytotechnologist (ASCP)   QC reviewed by: Comment     Comment: Ranell Patrick, Cytotechnologist (ASCP)   PAP SMEAR COMMENT .    Note: Comment  Comment: The Pap smear is a screening test designed to aid in the detection of premalignant and malignant conditions of the uterine cervix.  It is not a diagnostic procedure and should not be used as the sole means of detecting cervical cancer.  Both false-positive and false-negative reports do occur.    Test Methodology Comment     Comment: This liquid based ThinPrep(R) pap test was screened with the use of an image guided system.    HPV, high-risk Negative Negative    Comment: This high-risk HPV test detects thirteen high-risk types (16/18/31/33/35/39/45/51/52/56/58/59/68) without differentiation.   Basic metabolic panel     Status: Abnormal   Collection Time: 05/31/15 10:56 AM  Result Value Ref Range   Sodium 138 135 - 145 mmol/L   Potassium 4.1 3.5 - 5.1 mmol/L   Chloride 100 (L) 101 - 111 mmol/L   CO2 29 22 - 32 mmol/L   Glucose, Bld 104 (H) 65 - 99 mg/dL   BUN 16 6 - 20 mg/dL   Creatinine, Ser 0.70 0.44 - 1.00 mg/dL   Calcium 9.3 8.9 - 10.3 mg/dL   GFR calc non Af Amer >60 >60  mL/min   GFR calc Af Amer >60 >60 mL/min    Comment: (NOTE) The eGFR has been calculated using the CKD EPI equation. This calculation has not been validated in all clinical situations. eGFR's persistently <60 mL/min signify possible Chronic Kidney Disease.    Anion gap 9 5 - 15  CBC     Status: None   Collection Time: 05/31/15 10:56 AM  Result Value Ref Range   WBC 5.3 3.6 - 11.0 K/uL   RBC 4.46 3.80 - 5.20 MIL/uL   Hemoglobin 13.7 12.0 - 16.0 g/dL   HCT 41.2 35.0 - 47.0 %   MCV 92.4 80.0 - 100.0 fL   MCH 30.7 26.0 - 34.0 pg   MCHC 33.3 32.0 - 36.0 g/dL   RDW 13.6 11.5 - 14.5 %   Platelets 406 150 - 440 K/uL  Troponin I     Status: None   Collection Time: 05/31/15 10:56 AM  Result Value Ref Range   Troponin I <0.03 <0.031 ng/mL    Comment:        NO INDICATION OF MYOCARDIAL INJURY.   Sedimentation rate     Status: None   Collection Time: 05/31/15 10:56 AM  Result Value Ref Range   Sed Rate 27 0 - 30 mm/hr     Assessment & Plan   1. Multiple joint pain Etiologies discussed include rhabdo, post viral arthralgia, earl rheumatological disorder, fibromyalgia, statin medication side effect. Stop statin for 1-2 weeks and analyze symptoms. Increase hydration with water. Will get blood work to start with.   - Quantiferon tb gold assay - Sedimentation rate - ANA - Cyclic citrul peptide antibody, IgG - Rheumatoid factor - Iron and TIBC - Ferritin - Iron - Aldolase - Lactate Dehydrogenase - CBC with Differential/Platelet - Basic Metabolic Panel (BMET)  2. Other fatigue  - Quantiferon tb gold assay - Sedimentation rate - ANA - Cyclic citrul peptide antibody, IgG - Rheumatoid factor - Iron and TIBC - Ferritin - Iron - Aldolase - Lactate Dehydrogenase - CBC with Differential/Platelet - Basic Metabolic Panel (BMET)  3. Night sweats Rule out TB  - Quantiferon tb gold assay - Sedimentation rate - ANA - Cyclic citrul peptide antibody, IgG - Rheumatoid  factor - Iron and TIBC - Ferritin - Iron - Aldolase - Lactate Dehydrogenase - CBC  with Differential/Platelet - Basic Metabolic Panel (BMET)  4. Screening for tuberculosis  - Quantiferon tb gold assay

## 2015-06-05 NOTE — Patient Instructions (Signed)
Stop crestor for 1-2 weeks and see if symptoms improve or not.

## 2015-06-06 ENCOUNTER — Telehealth: Payer: Self-pay

## 2015-06-06 ENCOUNTER — Other Ambulatory Visit: Payer: Self-pay | Admitting: Family Medicine

## 2015-06-06 ENCOUNTER — Encounter: Payer: Self-pay | Admitting: Family Medicine

## 2015-06-06 DIAGNOSIS — E039 Hypothyroidism, unspecified: Secondary | ICD-10-CM | POA: Insufficient documentation

## 2015-06-06 DIAGNOSIS — E038 Other specified hypothyroidism: Secondary | ICD-10-CM | POA: Insufficient documentation

## 2015-06-07 NOTE — Telephone Encounter (Signed)
error 

## 2015-06-08 DIAGNOSIS — E038 Other specified hypothyroidism: Secondary | ICD-10-CM | POA: Diagnosis not present

## 2015-06-08 LAB — CBC WITH DIFFERENTIAL/PLATELET
Basophils Absolute: 0 10*3/uL (ref 0.0–0.2)
Basos: 0 %
EOS (ABSOLUTE): 0.1 10*3/uL (ref 0.0–0.4)
Eos: 2 %
Hematocrit: 37.9 % (ref 34.0–46.6)
Hemoglobin: 12.1 g/dL (ref 11.1–15.9)
Immature Grans (Abs): 0 10*3/uL (ref 0.0–0.1)
Immature Granulocytes: 0 %
Lymphocytes Absolute: 2.3 10*3/uL (ref 0.7–3.1)
Lymphs: 40 %
MCH: 30.7 pg (ref 26.6–33.0)
MCHC: 31.9 g/dL (ref 31.5–35.7)
MCV: 96 fL (ref 79–97)
Monocytes Absolute: 0.6 10*3/uL (ref 0.1–0.9)
Monocytes: 10 %
Neutrophils Absolute: 2.6 10*3/uL (ref 1.4–7.0)
Neutrophils: 48 %
Platelets: 314 10*3/uL (ref 150–379)
RBC: 3.94 x10E6/uL (ref 3.77–5.28)
RDW: 13.7 % (ref 12.3–15.4)
WBC: 5.6 10*3/uL (ref 3.4–10.8)

## 2015-06-08 LAB — QUANTIFERON IN TUBE
QFT TB AG MINUS NIL VALUE: 0 IU/mL
QUANTIFERON MITOGEN VALUE: >10 IU/mL
QUANTIFERON TB AG VALUE: 0.04 IU/mL
QUANTIFERON TB GOLD: NEGATIVE
Quantiferon Nil Value: 0.04 IU/mL

## 2015-06-08 LAB — RHEUMATOID FACTOR: Rhuematoid fact SerPl-aCnc: <10 IU/mL (ref 0.0–13.9)

## 2015-06-08 LAB — IRON AND TIBC
Iron Saturation: 24 % (ref 15–55)
Iron: 70 ug/dL (ref 27–159)
Total Iron Binding Capacity: 294 ug/dL (ref 250–450)
UIBC: 224 ug/dL (ref 131–425)

## 2015-06-08 LAB — BASIC METABOLIC PANEL
BUN/Creatinine Ratio: 19 (ref 9–23)
BUN: 13 mg/dL (ref 6–24)
CO2: 26 mmol/L (ref 18–29)
Calcium: 9.3 mg/dL (ref 8.7–10.2)
Chloride: 102 mmol/L (ref 96–106)
Creatinine, Ser: 0.67 mg/dL (ref 0.57–1.00)
GFR calc Af Amer: 112 mL/min/{1.73_m2} (ref >59)
GFR calc non Af Amer: 97 mL/min/{1.73_m2} (ref >59)
Glucose: 100 mg/dL — ABNORMAL HIGH (ref 65–99)
Potassium: 4.3 mmol/L (ref 3.5–5.2)
Sodium: 144 mmol/L (ref 134–144)

## 2015-06-08 LAB — FERRITIN: Ferritin: 48 ng/mL (ref 15–150)

## 2015-06-08 LAB — LACTATE DEHYDROGENASE: LDH: 190 IU/L (ref 119–226)

## 2015-06-08 LAB — ANA: Anti Nuclear Antibody(ANA): NEGATIVE

## 2015-06-08 LAB — QUANTIFERON TB GOLD ASSAY (BLOOD)

## 2015-06-08 LAB — CYCLIC CITRUL PEPTIDE ANTIBODY, IGG/IGA: Cyclic Citrullin Peptide Ab: 4 units (ref 0–19)

## 2015-06-08 LAB — ALDOLASE: Aldolase: 4.9 U/L (ref 3.3–10.3)

## 2015-06-08 LAB — SEDIMENTATION RATE: Sed Rate: 4 mm/hr (ref 0–40)

## 2015-06-09 LAB — T3, FREE: T3, Free: 3.4 pg/mL (ref 2.0–4.4)

## 2015-06-09 LAB — T4, FREE: Free T4: 1.09 ng/dL (ref 0.82–1.77)

## 2015-06-09 LAB — TSH: TSH: 3.16 u[IU]/mL (ref 0.450–4.500)

## 2015-09-20 ENCOUNTER — Inpatient Hospital Stay
Admission: EM | Admit: 2015-09-20 | Discharge: 2015-09-22 | DRG: 379 | Disposition: A | Discharge status: Home / Self Care | Payer: Medicare Other | Attending: Internal Medicine | Admitting: Internal Medicine

## 2015-09-20 DIAGNOSIS — Z833 Family history of diabetes mellitus: Secondary | ICD-10-CM

## 2015-09-20 DIAGNOSIS — I25119 Atherosclerotic heart disease of native coronary artery with unspecified angina pectoris: Secondary | ICD-10-CM | POA: Diagnosis present

## 2015-09-20 DIAGNOSIS — T39395A Adverse effect of other nonsteroidal anti-inflammatory drugs [NSAID], initial encounter: Secondary | ICD-10-CM | POA: Diagnosis not present

## 2015-09-20 DIAGNOSIS — K2100 Gastro-esophageal reflux disease with esophagitis, without bleeding: Secondary | ICD-10-CM | POA: Insufficient documentation

## 2015-09-20 DIAGNOSIS — K2901 Acute gastritis with bleeding: Secondary | ICD-10-CM | POA: Diagnosis not present

## 2015-09-20 DIAGNOSIS — Z8249 Family history of ischemic heart disease and other diseases of the circulatory system: Secondary | ICD-10-CM | POA: Diagnosis not present

## 2015-09-20 DIAGNOSIS — Z7982 Long term (current) use of aspirin: Secondary | ICD-10-CM

## 2015-09-20 DIAGNOSIS — K319 Disease of stomach and duodenum, unspecified: Secondary | ICD-10-CM | POA: Diagnosis not present

## 2015-09-20 DIAGNOSIS — R109 Unspecified abdominal pain: Secondary | ICD-10-CM | POA: Diagnosis not present

## 2015-09-20 DIAGNOSIS — Z79899 Other long term (current) drug therapy: Secondary | ICD-10-CM

## 2015-09-20 DIAGNOSIS — K3189 Other diseases of stomach and duodenum: Secondary | ICD-10-CM | POA: Diagnosis not present

## 2015-09-20 DIAGNOSIS — Z87891 Personal history of nicotine dependence: Secondary | ICD-10-CM

## 2015-09-20 DIAGNOSIS — E785 Hyperlipidemia, unspecified: Secondary | ICD-10-CM | POA: Diagnosis present

## 2015-09-20 DIAGNOSIS — I10 Essential (primary) hypertension: Secondary | ICD-10-CM | POA: Diagnosis present

## 2015-09-20 DIAGNOSIS — K219 Gastro-esophageal reflux disease without esophagitis: Secondary | ICD-10-CM

## 2015-09-20 DIAGNOSIS — K274 Chronic or unspecified peptic ulcer, site unspecified, with hemorrhage: Secondary | ICD-10-CM | POA: Diagnosis present

## 2015-09-20 DIAGNOSIS — K2980 Duodenitis without bleeding: Secondary | ICD-10-CM | POA: Insufficient documentation

## 2015-09-20 DIAGNOSIS — M549 Dorsalgia, unspecified: Secondary | ICD-10-CM | POA: Diagnosis present

## 2015-09-20 DIAGNOSIS — R000 Tachycardia, unspecified: Secondary | ICD-10-CM | POA: Diagnosis not present

## 2015-09-20 DIAGNOSIS — K2990 Gastroduodenitis, unspecified, without bleeding: Secondary | ICD-10-CM | POA: Diagnosis not present

## 2015-09-20 DIAGNOSIS — Z8711 Personal history of peptic ulcer disease: Secondary | ICD-10-CM | POA: Diagnosis not present

## 2015-09-20 DIAGNOSIS — F909 Attention-deficit hyperactivity disorder, unspecified type: Secondary | ICD-10-CM | POA: Diagnosis present

## 2015-09-20 DIAGNOSIS — K259 Gastric ulcer, unspecified as acute or chronic, without hemorrhage or perforation: Secondary | ICD-10-CM | POA: Diagnosis not present

## 2015-09-20 DIAGNOSIS — K922 Gastrointestinal hemorrhage, unspecified: Secondary | ICD-10-CM | POA: Diagnosis not present

## 2015-09-20 DIAGNOSIS — K449 Diaphragmatic hernia without obstruction or gangrene: Secondary | ICD-10-CM | POA: Diagnosis present

## 2015-09-20 DIAGNOSIS — I2510 Atherosclerotic heart disease of native coronary artery without angina pectoris: Secondary | ICD-10-CM | POA: Diagnosis not present

## 2015-09-20 DIAGNOSIS — F419 Anxiety disorder, unspecified: Secondary | ICD-10-CM | POA: Diagnosis present

## 2015-09-20 DIAGNOSIS — K920 Hematemesis: Secondary | ICD-10-CM | POA: Insufficient documentation

## 2015-09-20 DIAGNOSIS — R112 Nausea with vomiting, unspecified: Secondary | ICD-10-CM | POA: Insufficient documentation

## 2015-09-20 MED ORDER — ONDANSETRON HCL 4 MG/2ML IJ SOLN
4.0000 mg | Freq: Once | INTRAMUSCULAR | Status: AC
  Administered 2015-09-20: 4 mg via INTRAVENOUS

## 2015-09-20 MED ORDER — MORPHINE SULFATE (PF) 4 MG/ML IV SOLN
4.0000 mg | Freq: Once | INTRAVENOUS | Status: AC
  Administered 2015-09-20: 4 mg via INTRAVENOUS

## 2015-09-20 MED ORDER — MORPHINE SULFATE (PF) 4 MG/ML IV SOLN
INTRAVENOUS | Status: AC
  Administered 2015-09-20: 4 mg via INTRAVENOUS
  Filled 2015-09-20: qty 1

## 2015-09-20 MED ORDER — ONDANSETRON HCL 4 MG/2ML IJ SOLN
INTRAMUSCULAR | Status: AC
  Administered 2015-09-20: 4 mg via INTRAVENOUS
  Filled 2015-09-20: qty 2

## 2015-09-20 NOTE — ED Notes (Signed)
Pt in with co bloody emesis and bloody stools since today co epigastric pain. Hx of the same she had ulcers.

## 2015-09-21 ENCOUNTER — Inpatient Hospital Stay: Payer: Medicare Other | Admitting: Anesthesiology

## 2015-09-21 ENCOUNTER — Encounter: Payer: Self-pay | Admitting: Internal Medicine

## 2015-09-21 ENCOUNTER — Encounter
Admission: EM | Disposition: A | Discharge status: Home / Self Care | Payer: Self-pay | Source: Home / Self Care | Attending: Internal Medicine

## 2015-09-21 DIAGNOSIS — K2100 Gastro-esophageal reflux disease with esophagitis, without bleeding: Secondary | ICD-10-CM | POA: Insufficient documentation

## 2015-09-21 DIAGNOSIS — Z8711 Personal history of peptic ulcer disease: Secondary | ICD-10-CM | POA: Diagnosis not present

## 2015-09-21 DIAGNOSIS — K449 Diaphragmatic hernia without obstruction or gangrene: Secondary | ICD-10-CM | POA: Diagnosis present

## 2015-09-21 DIAGNOSIS — K3189 Other diseases of stomach and duodenum: Secondary | ICD-10-CM | POA: Diagnosis not present

## 2015-09-21 DIAGNOSIS — Z7982 Long term (current) use of aspirin: Secondary | ICD-10-CM | POA: Diagnosis not present

## 2015-09-21 DIAGNOSIS — Z833 Family history of diabetes mellitus: Secondary | ICD-10-CM | POA: Diagnosis not present

## 2015-09-21 DIAGNOSIS — R000 Tachycardia, unspecified: Secondary | ICD-10-CM | POA: Diagnosis present

## 2015-09-21 DIAGNOSIS — T39395A Adverse effect of other nonsteroidal anti-inflammatory drugs [NSAID], initial encounter: Secondary | ICD-10-CM | POA: Diagnosis present

## 2015-09-21 DIAGNOSIS — E785 Hyperlipidemia, unspecified: Secondary | ICD-10-CM | POA: Diagnosis present

## 2015-09-21 DIAGNOSIS — K274 Chronic or unspecified peptic ulcer, site unspecified, with hemorrhage: Secondary | ICD-10-CM | POA: Diagnosis present

## 2015-09-21 DIAGNOSIS — K319 Disease of stomach and duodenum, unspecified: Secondary | ICD-10-CM | POA: Diagnosis present

## 2015-09-21 DIAGNOSIS — Z79899 Other long term (current) drug therapy: Secondary | ICD-10-CM | POA: Diagnosis not present

## 2015-09-21 DIAGNOSIS — K2901 Acute gastritis with bleeding: Secondary | ICD-10-CM | POA: Diagnosis present

## 2015-09-21 DIAGNOSIS — K2980 Duodenitis without bleeding: Secondary | ICD-10-CM | POA: Insufficient documentation

## 2015-09-21 DIAGNOSIS — Z8249 Family history of ischemic heart disease and other diseases of the circulatory system: Secondary | ICD-10-CM | POA: Diagnosis not present

## 2015-09-21 DIAGNOSIS — K922 Gastrointestinal hemorrhage, unspecified: Secondary | ICD-10-CM | POA: Diagnosis present

## 2015-09-21 DIAGNOSIS — F909 Attention-deficit hyperactivity disorder, unspecified type: Secondary | ICD-10-CM | POA: Diagnosis present

## 2015-09-21 DIAGNOSIS — I10 Essential (primary) hypertension: Secondary | ICD-10-CM | POA: Diagnosis present

## 2015-09-21 DIAGNOSIS — F419 Anxiety disorder, unspecified: Secondary | ICD-10-CM | POA: Diagnosis present

## 2015-09-21 DIAGNOSIS — Z87891 Personal history of nicotine dependence: Secondary | ICD-10-CM | POA: Diagnosis not present

## 2015-09-21 DIAGNOSIS — R112 Nausea with vomiting, unspecified: Secondary | ICD-10-CM | POA: Insufficient documentation

## 2015-09-21 DIAGNOSIS — I25119 Atherosclerotic heart disease of native coronary artery with unspecified angina pectoris: Secondary | ICD-10-CM | POA: Diagnosis present

## 2015-09-21 DIAGNOSIS — K920 Hematemesis: Secondary | ICD-10-CM | POA: Diagnosis not present

## 2015-09-21 DIAGNOSIS — M549 Dorsalgia, unspecified: Secondary | ICD-10-CM | POA: Diagnosis present

## 2015-09-21 HISTORY — PX: ESOPHAGOGASTRODUODENOSCOPY (EGD) WITH PROPOFOL: SHX5813

## 2015-09-21 LAB — BASIC METABOLIC PANEL
Anion gap: 7 (ref 5–15)
BUN: 12 mg/dL (ref 6–20)
CO2: 23 mmol/L (ref 22–32)
Calcium: 8 mg/dL — ABNORMAL LOW (ref 8.9–10.3)
Chloride: 110 mmol/L (ref 101–111)
Creatinine, Ser: 0.73 mg/dL (ref 0.44–1.00)
GFR calc Af Amer: >60 mL/min (ref >60)
GFR calc non Af Amer: >60 mL/min (ref >60)
Glucose, Bld: 87 mg/dL (ref 65–99)
Potassium: 3.8 mmol/L (ref 3.5–5.1)
Sodium: 140 mmol/L (ref 135–145)

## 2015-09-21 LAB — COMPREHENSIVE METABOLIC PANEL
ALT: 17 U/L (ref 14–54)
AST: 20 U/L (ref 15–41)
Albumin: 4.1 g/dL (ref 3.5–5.0)
Alkaline Phosphatase: 62 U/L (ref 38–126)
Anion gap: 9 (ref 5–15)
BUN: 15 mg/dL (ref 6–20)
CO2: 23 mmol/L (ref 22–32)
Calcium: 8.9 mg/dL (ref 8.9–10.3)
Chloride: 105 mmol/L (ref 101–111)
Creatinine, Ser: 0.81 mg/dL (ref 0.44–1.00)
GFR calc Af Amer: >60 mL/min (ref >60)
GFR calc non Af Amer: >60 mL/min (ref >60)
Glucose, Bld: 95 mg/dL (ref 65–99)
Potassium: 3.6 mmol/L (ref 3.5–5.1)
Sodium: 137 mmol/L (ref 135–145)
Total Bilirubin: 0.3 mg/dL (ref 0.3–1.2)
Total Protein: 7.4 g/dL (ref 6.5–8.1)

## 2015-09-21 LAB — CBC
HCT: 38 % (ref 35.0–47.0)
Hemoglobin: 12.9 g/dL (ref 12.0–16.0)
MCH: 31 pg (ref 26.0–34.0)
MCHC: 33.9 g/dL (ref 32.0–36.0)
MCV: 91.6 fL (ref 80.0–100.0)
Platelets: 306 10*3/uL (ref 150–440)
RBC: 4.14 MIL/uL (ref 3.80–5.20)
RDW: 13.5 % (ref 11.5–14.5)
WBC: 10.6 10*3/uL (ref 3.6–11.0)

## 2015-09-21 LAB — HEMOGLOBIN AND HEMATOCRIT, BLOOD
HCT: 35.4 % (ref 35.0–47.0)
HCT: 35.5 % (ref 35.0–47.0)
Hemoglobin: 11.9 g/dL — ABNORMAL LOW (ref 12.0–16.0)
Hemoglobin: 11.8 g/dL — ABNORMAL LOW (ref 12.0–16.0)

## 2015-09-21 LAB — PROTIME-INR
INR: 0.93
Prothrombin Time: 12.7 seconds (ref 11.4–15.0)

## 2015-09-21 SURGERY — ESOPHAGOGASTRODUODENOSCOPY (EGD) WITH PROPOFOL
Anesthesia: General

## 2015-09-21 MED ORDER — MORPHINE SULFATE (PF) 2 MG/ML IV SOLN
2.0000 mg | INTRAVENOUS | Status: DC | PRN
  Administered 2015-09-21 (×4): 2 mg via INTRAVENOUS
  Filled 2015-09-21 (×4): qty 1

## 2015-09-21 MED ORDER — PROPOFOL 10 MG/ML IV BOLUS
INTRAVENOUS | Status: DC | PRN
  Administered 2015-09-21: 100 mg via INTRAVENOUS

## 2015-09-21 MED ORDER — SODIUM CHLORIDE 0.9 % IV BOLUS (SEPSIS)
1000.0000 mL | Freq: Once | INTRAVENOUS | Status: AC
  Administered 2015-09-21: 1000 mL via INTRAVENOUS

## 2015-09-21 MED ORDER — SODIUM CHLORIDE 0.9 % IV SOLN: 80.0000 mg | Freq: Once | INTRAVENOUS | Status: DC

## 2015-09-21 MED ORDER — GLYCOPYRROLATE 0.2 MG/ML IJ SOLN
INTRAMUSCULAR | Status: DC | PRN
  Administered 2015-09-21: 0.2 mg via INTRAVENOUS

## 2015-09-21 MED ORDER — ACETAMINOPHEN 325 MG PO TABS
650.0000 mg | ORAL_TABLET | Freq: Four times a day (QID) | ORAL | Status: DC | PRN
Start: 2015-09-21 — End: 2015-09-22
  Administered 2015-09-21 – 2015-09-22 (×3): 650 mg via ORAL
  Filled 2015-09-21 (×3): qty 2

## 2015-09-21 MED ORDER — PANTOPRAZOLE SODIUM 40 MG IV SOLR: 40.0000 mg | Freq: Two times a day (BID) | INTRAVENOUS | Status: DC

## 2015-09-21 MED ORDER — FLUTICASONE PROPIONATE 50 MCG/ACT NA SUSP
2.0000 | Freq: Every day | NASAL | Status: DC
  Administered 2015-09-21 – 2015-09-22 (×2): 2 via NASAL
  Filled 2015-09-21: qty 16

## 2015-09-21 MED ORDER — ONDANSETRON HCL 4 MG PO TABS: 4.0000 mg | ORAL_TABLET | Freq: Four times a day (QID) | ORAL | Status: DC | PRN

## 2015-09-21 MED ORDER — ONDANSETRON HCL 4 MG/2ML IJ SOLN
4.0000 mg | Freq: Four times a day (QID) | INTRAMUSCULAR | Status: DC | PRN
  Administered 2015-09-21 (×2): 4 mg via INTRAVENOUS
  Filled 2015-09-21 (×2): qty 2

## 2015-09-21 MED ORDER — PROPOFOL 500 MG/50ML IV EMUL
INTRAVENOUS | Status: DC | PRN
  Administered 2015-09-21: 120 ug/kg/min via INTRAVENOUS

## 2015-09-21 MED ORDER — DIAZEPAM 5 MG PO TABS
5.0000 mg | ORAL_TABLET | Freq: Once | ORAL | Status: AC
  Administered 2015-09-21: 5 mg via ORAL
  Filled 2015-09-21: qty 1

## 2015-09-21 MED ORDER — PANTOPRAZOLE SODIUM 40 MG IV SOLR
80.0000 mg | Freq: Once | INTRAVENOUS | Status: AC
  Administered 2015-09-21: 80 mg via INTRAVENOUS
  Filled 2015-09-21: qty 80

## 2015-09-21 MED ORDER — SODIUM CHLORIDE 0.9 % IV SOLN
INTRAVENOUS | Status: DC
  Administered 2015-09-21 – 2015-09-22 (×5): via INTRAVENOUS

## 2015-09-21 MED ORDER — ACETAMINOPHEN 650 MG RE SUPP: 650.0000 mg | Freq: Four times a day (QID) | RECTAL | Status: DC | PRN

## 2015-09-21 MED ORDER — SODIUM CHLORIDE 0.9 % IV SOLN
8.0000 mg/h | INTRAVENOUS | Status: DC
  Administered 2015-09-21 – 2015-09-22 (×2): 8 mg/h via INTRAVENOUS
  Filled 2015-09-21 (×3): qty 80

## 2015-09-21 MED ORDER — LIDOCAINE 2% (20 MG/ML) 5 ML SYRINGE
INTRAMUSCULAR | Status: DC | PRN
  Administered 2015-09-21: 50 mg via INTRAVENOUS

## 2015-09-21 NOTE — Progress Notes (Addendum)
[facility] NAME: Linda Mejia    MR#:  [id number]  DATE OF BIRTH:  1955/08/30  SUBJECTIVE:  Came in with coffee round emesis. anxious  REVIEW OF SYSTEMS:   Review of Systems  Constitutional: Negative for fever, chills and weight loss.  HENT: Negative for ear discharge, ear pain and nosebleeds.   Eyes: Negative for blurred vision, pain and discharge.  Respiratory: Negative for sputum production, shortness of breath, wheezing and stridor.   Cardiovascular: Negative for chest pain, palpitations, orthopnea and PND.  Gastrointestinal: Positive for vomiting and blood in stool. Negative for nausea, abdominal pain and diarrhea.  Genitourinary: Negative for urgency and frequency.  Musculoskeletal: Negative for back pain and joint pain.  Neurological: Negative for sensory change, speech change, focal weakness and weakness.  Psychiatric/Behavioral: Negative for depression and hallucinations. The patient is nervous/anxious.  yes All other systems reviewed and are negative.  Tolerating Diet:yes Tolerating PT: not needed  DRUG ALLERGIES:  No Known Allergies  VITALS:  Blood pressure 105/80, pulse 89, temperature 98.9 F (37.2 C), temperature source Tympanic, resp. rate 17, height 5\' 6"  (1.676 m), weight 87.544 kg (193 lb), SpO2 98 %.  PHYSICAL EXAMINATION:   Physical Exam  GENERAL:  60 y.o.-year-old patient lying in the bed with no acute distress.  EYES: Pupils equal, round, reactive to light and accommodation. No scleral icterus. Extraocular muscles intact.  HEENT: Head atraumatic, normocephalic. Oropharynx and nasopharynx clear.  NECK:  Supple, no jugular venous distention. No thyroid enlargement, no tenderness.  LUNGS: Normal breath sounds bilaterally, no wheezing, rales, rhonchi. No use of accessory muscles of respiration.  CARDIOVASCULAR: S1, S2 normal. No murmurs, rubs, or gallops.  ABDOMEN: Soft, nontender, nondistended. Bowel  sounds present. No organomegaly or mass.  EXTREMITIES: No cyanosis, clubbing or edema b/l.    NEUROLOGIC: Cranial nerves II through XII are intact. No focal Motor or sensory deficits b/l.   PSYCHIATRIC:  patient is alert and oriented x 3.  SKIN: No obvious rash, lesion, or ulcer.   LABORATORY PANEL:  CBC  Recent Labs Lab 09/20/15 2336  09/21/15 1145  WBC 10.6  --   --   HGB 12.9  < > 11.8*  HCT 38.0  < > 35.5  PLT 306  --   --   < > = values in this interval not displayed.  Chemistries   Recent Labs Lab 09/20/15 2336 09/21/15 0531  NA 137 140  K 3.6 3.8  CL 105 110  CO2 23 23  GLUCOSE 95 87  BUN 15 12  CREATININE 0.81 0.73  CALCIUM 8.9 8.0*  AST 20  --   ALT 17  --   ALKPHOS 62  --   BILITOT 0.3  --     ASSESSMENT AND PLAN:  Linda Mejia is a 60 y.o. female with a known history ofCoronary artery disease, hypertension, anxiety disorder, attention deficit hyperactivity disorder, stomach ulcers, GI bleed presented to the emergency room with vomiting of blood and bleeding per rectum since yesterday patient first noticed the symptoms yesterday afternoon. She vomited twice and was bright red blood and patient had 2 episodes of rectal bleed. Has abdominal pain which is aching in nature 5 out of 10 scale of 1-10  1. Upper GI bleed secondary to peptic ulcer disease/gastritis as noted on the EGD. -Appears due to NSAID induced given increased use on and off of ibuprofen, aspirin and Goody powders for back pain. -Patient underwent EGD by  GI results as above were noted -Continue PPI. -We'll start her on full liquid diet advanced as tolerated -Patient advised to stop NSAIDs.  2. Hypertension continue home meds  3. Anxiety continue Valium as needed  4. Hyperlipidemia on Crestor    Case discussed with Care Management/Social Worker. Management plans discussed with the patient, family and they are in agreement.  CODE STATUS: Full  DVT Prophylaxis: SCD and tense   TOTAL TIME  TAKING CARE OF THIS PATIENT: 30 minutes.  >50% time spent on counselling and coordination of care  POSSIBLE D/C IN1 DAYS, DEPENDING ON CLINICAL CONDITION.  Note: This dictation was prepared with Dragon dictation along with smaller phrase technology. Any transcriptional errors that result from this process are unintentional.  Linda Mejia M.D on 09/21/2015 at 2:13 PM  Between 7am to 6pm - Pager - [phone]  After 6pm go to [email or web address] - password [email or web address] [facility]  Office  [phone]  CC: Primary care physician; Bobetta Lime, MD

## 2015-09-21 NOTE — Op Note (Signed)
[name] Patient Name: Linda Mejia Procedure Date: 09/21/2015 11:10 AM MRN: [id number] Account #: [id number] Date of Birth: 24-Aug-1955 Admit Type: Outpatient Age: 60 Room: [facility] ENDO ROOM 4 Gender: Female Note Status: Finalized Procedure:            Upper GI endoscopy Indications:          Coffee-ground emesis Providers:            Lucilla Lame, MD Referring MD:         Collie Siad (Referring MD) Medicines:            Propofol per Anesthesia Complications:        No immediate complications. Procedure:            Pre-Anesthesia Assessment:                       - Prior to the procedure, a History and Physical was                        performed, and patient medications and allergies were                        reviewed. The patient's tolerance of previous                        anesthesia was also reviewed. The risks and benefits of                        the procedure and the sedation options and risks were                        discussed with the patient. All questions were                        answered, and informed consent was obtained. Prior                        Anticoagulants: The patient has taken no previous                        anticoagulant or antiplatelet agents. ASA Grade                        Assessment: II - A patient with mild systemic disease.                        After reviewing the risks and benefits, the patient was                        deemed in satisfactory condition to undergo the                        procedure.                       After obtaining informed consent, the endoscope was                        passed under direct vision. Throughout the procedure,  the patient's blood pressure, pulse, and oxygen                        saturations were monitored continuously. The Endoscope                        was introduced through the mouth, and advanced to the                        second  part of duodenum. The upper GI endoscopy was                        accomplished without difficulty. The patient tolerated                        the procedure well. Findings:      LA Grade A (one or more mucosal breaks less than 5 mm, not extending       between tops of 2 mucosal folds) esophagitis with no bleeding was found       at the gastroesophageal junction.      A small hiatal hernia was present.      A single localized, 4 mm non-bleeding erosion was found in the gastric       antrum. There were no stigmata of recent bleeding.      Localized moderate inflammation characterized by erythema was found in       the gastric antrum.      Localized mild inflammation characterized by erythema was found in the       duodenal bulb. Impression:           - LA Grade A reflux esophagitis.                       - Small hiatal hernia.                       - Non-bleeding erosive gastropathy.                       - Gastritis.                       - Duodenitis.                       - No specimens collected. Recommendation:       - Follow an antireflux regimen.                       - Use a proton pump inhibitor PO daily. Procedure Code(s):    --- Professional ---                       [id number], Esophagogastroduodenoscopy, flexible, transoral;                        diagnostic, including collection of specimen(s) by                        brushing or washing, when performed (separate procedure) Diagnosis Code(s):    --- Professional ---  K92.0, Hematemesis                       K21.0, Gastro-esophageal reflux disease with esophagitis                       K31.89, Other diseases of stomach and duodenum                       K29.70, Gastritis, unspecified, without bleeding                       K29.80, Duodenitis without bleeding CPT copyright 2016 [employer]. All rights reserved. The codes documented in this report are preliminary and upon coder review may   be revised to meet current compliance requirements. Lucilla Lame, MD 09/21/2015 2:08:14 PM This report has been signed electronically. Number of Addenda: 0 Note Initiated On: 09/21/2015 11:10 AM      [facility]

## 2015-09-21 NOTE — ED Provider Notes (Signed)
[facility] Emergency Department Provider Note  ____________________________________________  Time seen: 11:45 PM  I have reviewed the triage vital signs and the nursing notes.   HISTORY  Chief Complaint Hematemesis     HPI Linda Mejia is a 60 y.o. female history of 7 bleeding ulcers diagnosed one year ago by Dr. Gustavo Lah presents with epigastric abdominal pain 8 out of 10" coffee-ground emesis and bright red blood per rectum with onset today. Patient admits to fatigue and dizziness. Of note patient noted to be tachycardic on presentation with a heart rate of 116 however hypertensive pressure 177/107.     Past Medical History  Diagnosis Date  . Anxiety   . Coronary artery disease   . Hypertension   . ADHD (attention deficit hyperactivity disorder)   . Chronic back pain   . History of stomach ulcers     x7 this year. Bleeding  . Herpes genitalis in women     Patient Active Problem List   Diagnosis Date Noted  . Subclinical hypothyroidism 06/06/2015  . Multiple joint pain 06/05/2015  . Other fatigue 06/05/2015  . Night sweats 06/05/2015  . Screening for tuberculosis 06/05/2015  . Headache 04/12/2015  . Subclinical hypothyroidism 03/26/2015  . Herpes simplex type 2 infection 03/21/2015  . Rhinitis, allergic 03/21/2015  . Cervical pain 09/21/2014  . ADD (attention deficit hyperactivity disorder, inattentive type) 09/20/2014  . Episodic paroxysmal anxiety disorder 09/20/2014  . History of gastric ulcer 09/20/2014  . Abuse, drug or alcohol 09/20/2014  . Breathlessness on exertion 08/28/2014  . HLD (hyperlipidemia) 10/03/2009  . Coronary artery disease involving native coronary artery of native heart with angina pectoris ([place]) 10/02/2009  . Hypertension goal BP (blood pressure) < 140/90 10/02/2009    Past Surgical History  Procedure Laterality Date  . Cesarean section    . Tubal ligation    . Cardiac catheterization    . Tonsillectomy       Current Outpatient Rx  Name  Route  Sig  Dispense  Refill  . acyclovir (ZOVIRAX) 400 MG tablet   Oral   Take 1 tablet by mouth 2 (two) times daily as needed. Reported on 06/05/2015         . amphetamine-dextroamphetamine (ADDERALL) 30 MG tablet   Oral   Take 30 mg by mouth 2 (two) times daily.         Marland Kitchen aspirin EC 81 MG tablet   Oral   Take 81 mg by mouth daily.         . butalbital-acetaminophen-caffeine (FIORICET) 50-325-40 MG tablet   Oral   Take 1 tablet by mouth every 6 (six) hours as needed for headache.   30 tablet   5   . diazepam (VALIUM) 10 MG tablet   Oral   Take 1 tablet by mouth 3 (three) times daily.         . fluticasone (FLONASE) 50 MCG/ACT nasal spray   Each Nare   Place 2 sprays into both nostrils daily.   16 g   5   . metoprolol (LOPRESSOR) 50 MG tablet   Oral   Take 1 tablet (50 mg total) by mouth 2 (two) times daily.   60 tablet   5   . omeprazole (PRILOSEC) 20 MG capsule   Oral   Take 1 capsule (20 mg total) by mouth 2 (two) times daily before a meal.   60 capsule   5   . rosuvastatin (CRESTOR) 20 MG tablet  Oral   Take 1 tablet (20 mg total) by mouth daily.   90 tablet   1   . TRINTELLIX 10 MG TABS   Oral   Take 1 tablet by mouth daily.           Dispense as written.   . zolpidem (AMBIEN) 10 MG tablet   Oral   Take 10 mg by mouth at bedtime as needed.             Allergies No known drug allergies  Family History  Problem Relation Age of Onset  . Diabetes Mother   . Heart disease Father     Social History Social History  Substance Use Topics  . Smoking status: Former Smoker -- 1.00 packs/day for 30 years  . Smokeless tobacco: Not on file  . Alcohol Use: Yes    Review of Systems  Constitutional: Negative for fever. Eyes: Negative for visual changes. ENT: Negative for sore throat. Cardiovascular: Negative for chest pain. Respiratory: Negative for shortness of breath. Gastrointestinal: Positive  for abdominal pain and bright red blood per rectum coffee-ground emesis Genitourinary: Negative for dysuria. Musculoskeletal: Negative for back pain. Skin: Negative for rash. Neurological: Negative for headaches, focal weakness or numbness.   10-point ROS otherwise negative.  ____________________________________________   PHYSICAL EXAM:  VITAL SIGNS: ED Triage Vitals  Enc Vitals Group     BP 09/20/15 2301 177/107 mmHg     Pulse Rate 09/20/15 2301 116     Resp 09/20/15 2301 20     Temp 09/20/15 2301 98.5 F (36.9 C)     Temp Source 09/20/15 2301 Oral     SpO2 09/20/15 2301 93 %     Weight 09/20/15 2301 175 lb (79.379 kg)     Height 09/20/15 2301 5\' 6"  (1.676 m)     Head Cir --      Peak Flow --      Pain Score 09/20/15 2302 5     Pain Loc --      Pain Edu? --      Excl. in [place]? --      Constitutional: Alert and oriented. Well appearing and in no distress. Eyes: Conjunctivae are normal. PERRL. Normal extraocular movements. ENT   Head: Normocephalic and atraumatic.   Nose: No congestion/rhinnorhea.   Mouth/Throat: Mucous membranes are moist.   Neck: No stridor. Hematological/Lymphatic/Immunilogical: No cervical lymphadenopathy. Cardiovascular: Normal rate, regular rhythm. Normal and symmetric distal pulses are present in all extremities. No murmurs, rubs, or gallops. Respiratory: Normal respiratory effort without tachypnea nor retractions. Breath sounds are clear and equal bilaterally. No wheezes/rales/rhonchi. Gastrointestinal: Gastric tenderness to palpation. No distention. There is no CVA tenderness. Genitourinary: deferred Musculoskeletal: Nontender with normal range of motion in all extremities. No joint effusions.  No lower extremity tenderness nor edema. Neurologic:  Normal speech and language. No gross focal neurologic deficits are appreciated. Speech is normal.  Skin:  Skin is warm, dry and intact. No rash noted. Psychiatric: Mood and affect are  normal. Speech and behavior are normal. Patient exhibits appropriate insight and judgment.  ____________________________________________    LABS (pertinent positives/negatives)  Labs Reviewed  HEMOGLOBIN AND HEMATOCRIT, BLOOD - Abnormal; Notable for the following:    Hemoglobin 11.9 (*)    All other components within normal limits  CBC  COMPREHENSIVE METABOLIC PANEL  PROTIME-INR  HEMOGLOBIN AND HEMATOCRIT, BLOOD  HEMOGLOBIN AND HEMATOCRIT, BLOOD  HEMOGLOBIN AND HEMATOCRIT, BLOOD  BASIC METABOLIC PANEL      INITIAL IMPRESSION / ASSESSMENT  AND PLAN / ED COURSE  Pertinent labs & imaging results that were available during my care of the patient were reviewed by me and considered in my medical decision making (see chart for details).  Patient given 4 morphine and 4 of Zofran for analgesia and antiemetic respectively.Patient discussed with Dr. Estanislado Pandy for hospital admission.  ____________________________________________   FINAL CLINICAL IMPRESSION(S) / ED DIAGNOSES  Final diagnoses:  None  Upper GI bleed    Gregor Hams, MD 09/21/15 [phone]

## 2015-09-21 NOTE — H&P (Signed)
[place] [facility] [place] NAME: Linda Mejia    MR#:  [id number]  DATE OF BIRTH:  06-08-1955  DATE OF ADMISSION:  09/20/2015  PRIMARY CARE PHYSICIAN: Bobetta Lime, MD   REQUESTING/REFERRING PHYSICIAN:   CHIEF COMPLAINT:   Chief Complaint  Patient presents with  . Hematemesis    HISTORY OF PRESENT ILLNESS: Linda Mejia  is a 60 y.o. female with a known history ofCoronary artery disease, hypertension, anxiety disorder, attention deficit hyperactivity disorder, stomach ulcers, GI bleed presented to the emergency room with vomiting of blood and bleeding per rectum since yesterday patient first noticed the symptoms yesterday afternoon. She vomited twice and was bright red blood and patient had 2 episodes of rectal bleed. Has abdominal pain which is aching in nature 5 out of 10 scale of 1-10. No history of chest pain. No history of shortness of breath. No history of fever or chills or cough. Patient had upper esophagogastroduodenoscopy last year. Hospitalist service was consulted for further care of the patient. Patient takes oral aspirin as outpatient and also takes ibuprofen and Goody powder for pain but not on a regular basis.   PAST MEDICAL HISTORY:   Past Medical History  Diagnosis Date  . Anxiety   . Coronary artery disease   . Hypertension   . ADHD (attention deficit hyperactivity disorder)   . Chronic back pain   . History of stomach ulcers     x7 this year. Bleeding  . Herpes genitalis in women     PAST SURGICAL HISTORY: Past Surgical History  Procedure Laterality Date  . Cesarean section    . Tubal ligation    . Cardiac catheterization    . Tonsillectomy      SOCIAL HISTORY:  Social History  Substance Use Topics  . Smoking status: Former Smoker -- 1.00 packs/day for 30 years  . Smokeless tobacco: Not on file  . Alcohol Use: Yes    FAMILY HISTORY:  Family History  Problem Relation Age of Onset  . Diabetes Mother   . Heart  disease Father     DRUG ALLERGIES: No Known Allergies  REVIEW OF SYSTEMS:   CONSTITUTIONAL: No fever, has weakness.  EYES: No blurred or double vision.  EARS, NOSE, AND THROAT: No tinnitus or ear pain.  RESPIRATORY: No cough, shortness of breath, wheezing or hemoptysis.  CARDIOVASCULAR: No chest pain, orthopnea, edema.  GASTROINTESTINAL: Has nausea, vomiting of blood noted, has abdominal pain, no diarrhea. GENITOURINARY: No dysuria, hematuria.  ENDOCRINE: No polyuria, nocturia,  HEMATOLOGY: No anemia, easy bruising or bleeding SKIN: No rash or lesion. MUSCULOSKELETAL: No joint pain or arthritis.   NEUROLOGIC: No tingling, numbness, weakness.  PSYCHIATRY: No anxiety or depression.   MEDICATIONS AT HOME:  Prior to Admission medications   Medication Sig Start Date End Date Taking? Authorizing Provider  acyclovir (ZOVIRAX) 400 MG tablet Take 1 tablet by mouth 2 (two) times daily as needed. Reported on 06/05/2015 03/20/15  Yes Historical Provider, MD  amphetamine-dextroamphetamine (ADDERALL) 30 MG tablet Take 30 mg by mouth 2 (two) times daily.   Yes Historical Provider, MD  aspirin EC 81 MG tablet Take 81 mg by mouth daily.   Yes Historical Provider, MD  butalbital-acetaminophen-caffeine (FIORICET) 50-325-40 MG tablet Take 1 tablet by mouth every 6 (six) hours as needed for headache. 03/21/15 03/20/16 Yes Bobetta Lime, MD  diazepam (VALIUM) 10 MG tablet Take 1 tablet by mouth 3 (three) times daily. 02/22/15  Yes Historical Provider, MD  fluticasone (FLONASE) 50 MCG/ACT nasal spray Place 2 sprays into both nostrils daily. 03/21/15  Yes Bobetta Lime, MD  metoprolol (LOPRESSOR) 50 MG tablet Take 1 tablet (50 mg total) by mouth 2 (two) times daily. 03/21/15  Yes Bobetta Lime, MD  omeprazole (PRILOSEC) 20 MG capsule Take 1 capsule (20 mg total) by mouth 2 (two) times daily before a meal. 03/21/15  Yes Bobetta Lime, MD  rosuvastatin (CRESTOR) 20 MG tablet Take 1 tablet (20 mg total) by  mouth daily. 03/26/15  Yes Bobetta Lime, MD  TRINTELLIX 10 MG TABS Take 1 tablet by mouth daily. 09/06/15  Yes Historical Provider, MD  zolpidem (AMBIEN) 10 MG tablet Take 10 mg by mouth at bedtime as needed.     Yes Historical Provider, MD      PHYSICAL EXAMINATION:   VITAL SIGNS: Blood pressure 164/94, pulse 97, temperature 98.5 F (36.9 C), temperature source Oral, resp. rate 14, height 5\' 6"  (1.676 m), weight 79.379 kg (175 lb), SpO2 99 %.  GENERAL:  60 y.o.-year-old patient lying in the bed with no acute distress.  EYES: Pupils equal, round, reactive to light and accommodation. No scleral icterus. Extraocular muscles intact.  HEENT: Head atraumatic, normocephalic. Oropharynx and nasopharynx clear.  NECK:  Supple, no jugular venous distention. No thyroid enlargement, no tenderness.  LUNGS: Normal breath sounds bilaterally, no wheezing, rales,rhonchi or crepitation. No use of accessory muscles of respiration.  CARDIOVASCULAR: S1, S2 normal. No murmurs, rubs, or gallops.  ABDOMEN: Soft, mild tenderness around the umbilicus noted, nondistended. Bowel sounds present. No organomegaly or mass.  EXTREMITIES: No pedal edema, cyanosis, or clubbing.  NEUROLOGIC: Cranial nerves II through XII are intact. Muscle strength 5/5 in all extremities. Sensation intact. Gait normal. PSYCHIATRIC: The patient is alert and oriented x 3.  SKIN: No obvious rash, lesion, or ulcer.   LABORATORY PANEL:   CBC  Recent Labs Lab 09/20/15 2336  WBC 10.6  HGB 12.9  HCT 38.0  PLT 306  MCV 91.6  MCH 31.0  MCHC 33.9  RDW 13.5   ------------------------------------------------------------------------------------------------------------------  Chemistries   Recent Labs Lab 09/20/15 2336  NA 137  K 3.6  CL 105  CO2 23  GLUCOSE 95  BUN 15  CREATININE 0.81  CALCIUM 8.9  AST 20  ALT 17  ALKPHOS 62  BILITOT 0.3    ------------------------------------------------------------------------------------------------------------------ estimated creatinine clearance is 79.5 mL/min (by C-G formula based on Cr of 0.81). ------------------------------------------------------------------------------------------------------------------ No results for input(s): TSH, T4TOTAL, T3FREE, THYROIDAB in the last 72 hours.  Invalid input(s): FREET3   Coagulation profile  Recent Labs Lab 09/20/15 2336  INR 0.93   ------------------------------------------------------------------------------------------------------------------- No results for input(s): DDIMER in the last 72 hours. -------------------------------------------------------------------------------------------------------------------  Cardiac Enzymes No results for input(s): CKMB, TROPONINI, MYOGLOBIN in the last 168 hours.  Invalid input(s): CK ------------------------------------------------------------------------------------------------------------------ Invalid input(s): POCBNP  ---------------------------------------------------------------------------------------------------------------  Urinalysis    Component Value Date/Time   COLORURINE Straw 08/03/2014 2125   APPEARANCEUR Clear 08/03/2014 2125   LABSPEC 1.006 08/03/2014 2125   PHURINE 6.0 08/03/2014 2125   GLUCOSEU Negative 08/03/2014 2125   HGBUR Negative 08/03/2014 2125   BILIRUBINUR Negative 08/03/2014 2125   KETONESUR Negative 08/03/2014 2125   PROTEINUR Negative 08/03/2014 2125   NITRITE Negative 08/03/2014 2125   LEUKOCYTESUR Negative 08/03/2014 2125     RADIOLOGY: No results found.  EKG: Orders placed or performed during the hospital encounter of 05/31/15  . ED EKG within 10 minutes  . ED EKG within 10 minutes    IMPRESSION AND PLAN:  60 year old female patient with history of gastric ulcers, coronary artery disease, hypertension, anxiety disorder presented to the  emergency room with vomiting of blood bleeding per rectum. Admitting diagnosis 1. Acute gastrointestinal bleeding 2. Abdominal pain 3. Hypertension 4. History of gastric ulcers Treatment plan Admit patient to medical floor IV fluid hydration IV Protonix drip to continue Monitor hemoglobin and hematocrit every 6 hourly Gastroenterology consultation Nothing by mouth for now DVT prophylaxis with sequential compression device to both lower extremities.  All the records are reviewed and case discussed with ED provider. Management plans discussed with the patient, family and they are in agreement.  CODE STATUS:FULL Code Status History    This patient does not have a recorded code status. Please follow your organizational policy for patients in this situation.       TOTAL TIME TAKING CARE OF THIS PATIENT: 52 minutes.    Saundra Shelling M.D on 09/21/2015 at 1:25 AM  Between 7am to 6pm - Pager - [phone]  After 6pm go to [email or web address] - password [email or web address] [facility]  Office  [phone]  CC: Primary care physician; Bobetta Lime, MD

## 2015-09-21 NOTE — Transfer of Care (Signed)
Immediate Anesthesia Transfer of Care Note  Patient: Linda Mejia  Procedure(s) Performed: Procedure(s): ESOPHAGOGASTRODUODENOSCOPY (EGD) WITH PROPOFOL (N/A)  Patient Location: Endoscopy Unit  Anesthesia Type:General  Level of Consciousness: awake and alert   Airway & Oxygen Therapy: Patient Spontanous Breathing  Post-op Assessment: Report given to RN and Post -op Vital signs reviewed and stable  Post vital signs: Reviewed  Last Vitals:  Filed Vitals:   09/21/15 1333 09/21/15 1410  BP: 130/67 105/80  Pulse: 82 89  Temp: 36.3 C 37.2 C  Resp: 16 17    Last Pain:  Filed Vitals:   09/21/15 1412  PainSc: Asleep      Patients Stated Pain Goal: 0 ([id number])  Complications: No apparent anesthesia complications

## 2015-09-21 NOTE — Anesthesia Postprocedure Evaluation (Signed)
Anesthesia Post Note  Patient: YIXIN CULLITON  Procedure(s) Performed: Procedure(s) (LRB): ESOPHAGOGASTRODUODENOSCOPY (EGD) WITH PROPOFOL (N/A)  Patient location during evaluation: Endoscopy Anesthesia Type: General Level of consciousness: awake and alert Pain management: pain level controlled Vital Signs Assessment: post-procedure vital signs reviewed and stable Respiratory status: spontaneous breathing, nonlabored ventilation, respiratory function stable and patient connected to nasal cannula oxygen Cardiovascular status: blood pressure returned to baseline and stable Postop Assessment: no signs of nausea or vomiting Anesthetic complications: no    Last Vitals:  Filed Vitals:   09/21/15 1430 09/21/15 1440  BP: 115/77 123/75  Pulse: 91 84  Temp:    Resp: 18 16    Last Pain:  Filed Vitals:   09/21/15 1521  PainSc: 1                  Brittania Sudbeck S

## 2015-09-21 NOTE — ED Notes (Signed)
Pt transported to room 205 

## 2015-09-21 NOTE — Anesthesia Preprocedure Evaluation (Signed)
Anesthesia Evaluation  Patient identified by MRN, date of birth, ID band Patient awake    Reviewed: Allergy & Precautions, NPO status , Patient's Chart, lab work & pertinent test results, reviewed documented beta blocker date and time   Airway Mallampati: II  TM Distance: >3 FB     Dental  (+) Chipped   Pulmonary shortness of breath, former smoker,           Cardiovascular hypertension, Pt. on medications and Pt. on home beta blockers + angina + CAD       Neuro/Psych PSYCHIATRIC DISORDERS Anxiety    GI/Hepatic   Endo/Other  Hypothyroidism   Renal/GU      Musculoskeletal   Abdominal   Peds  Hematology   Anesthesia Other Findings   Reproductive/Obstetrics                             Anesthesia Physical Anesthesia Plan  ASA: III  Anesthesia Plan: General   Post-op Pain Management:    Induction: Intravenous  Airway Management Planned: Nasal Cannula  Additional Equipment:   Intra-op Plan:   Post-operative Plan:   Informed Consent: I have reviewed the patients History and Physical, chart, labs and discussed the procedure including the risks, benefits and alternatives for the proposed anesthesia with the patient or authorized representative who has indicated his/her understanding and acceptance.     Plan Discussed with: CRNA  Anesthesia Plan Comments:         Anesthesia Quick Evaluation

## 2015-09-22 MED ORDER — OMEPRAZOLE 20 MG PO CPDR: 40.0000 mg | DELAYED_RELEASE_CAPSULE | Freq: Two times a day (BID) | ORAL | Status: DC

## 2015-09-22 MED ORDER — DIAZEPAM 10 MG PO TABS: 10.0000 mg | ORAL_TABLET | Freq: Three times a day (TID) | ORAL | Status: DC | PRN

## 2015-09-22 NOTE — Discharge Instructions (Signed)
Take medications as prescribed.  Please contact your doctor if you should experience increased abdominal pain, nausea, vomiting (especially if red or looks like coffee grounds), bloody or black tarry stools. Also notify your doctor of any questions or concerns.   AVOID NSAIDS LIKE MOTRIN<IBURPRFEN,BC POWDER AND ASPIRIN

## 2015-09-22 NOTE — Progress Notes (Signed)
Pt stable. IV removed. D/c instructions given and education provided. Electronic prescription verified and given. Patient states she has received needed prescription for valium already. Pt states she understands instructions. Pt dressed and escorted out by staff. Driven home by family.

## 2015-09-22 NOTE — Discharge Summary (Signed)
[facility] NAME: Linda Mejia    MR#:  [id number]  DATE OF BIRTH:  1956-02-19  DATE OF ADMISSION:  09/20/2015 ADMITTING PHYSICIAN: Saundra Shelling, MD  DATE OF DISCHARGE: 09/22/15  PRIMARY CARE PHYSICIAN: Bobetta Lime, MD    ADMISSION DIAGNOSIS:  v blood hematemesis  DISCHARGE DIAGNOSIS:  GI bleed due to Acute Gastritis-NSAIDS induced Chronic anxiety  SECONDARY DIAGNOSIS:   Past Medical History  Diagnosis Date  . Anxiety   . Coronary artery disease   . Hypertension   . ADHD (attention deficit hyperactivity disorder)   . Chronic back pain   . History of stomach ulcers     x7 this year. Bleeding  . Herpes genitalis in women     HOSPITAL COURSE:  Linda Mejia is a 60 y.o. female with a known history ofCoronary artery disease, hypertension, anxiety disorder, attention deficit hyperactivity disorder, stomach ulcers, GI bleed presented to the emergency room with vomiting of blood and bleeding per rectum since yesterday patient first noticed the symptoms yesterday afternoon. She vomited twice and was bright red blood and patient had 2 episodes of rectal bleed. Has abdominal pain which is aching in nature 5 out of 10 scale of 1-10  1. Upper GI bleed secondary to peptic ulcer disease/gastritis as noted on the EGD. -Appears due to NSAID induced given increased use on and off of ibuprofen, aspirin and Goody powders for back pain. -Patient underwent EGD by GI results as above were noted -Continue PPI. -We'll start her on full liquid diet advanced as tolerated -Patient advised to stop NSAIDs.  2. Hypertension continue home meds  3. Anxiety continue Valium as needed  4. Hyperlipidemia on Crestor   Overall stable. D/c home   CONSULTS OBTAINED:  Treatment Team:  Lucilla Lame, MD  DRUG ALLERGIES:  No Known Allergies  DISCHARGE MEDICATIONS:   Current Discharge Medication List    CONTINUE these medications which have  CHANGED   Details  diazepam (VALIUM) 10 MG tablet Take 1 tablet (10 mg total) by mouth every 8 (eight) hours as needed for anxiety. Qty: 30 tablet, Refills: 0    omeprazole (PRILOSEC) 20 MG capsule Take 2 capsules (40 mg total) by mouth 2 (two) times daily before a meal. Qty: 60 capsule, Refills: 3   Associated Diagnoses: GERD without esophagitis      CONTINUE these medications which have NOT CHANGED   Details  acyclovir (ZOVIRAX) 400 MG tablet Take 1 tablet by mouth 2 (two) times daily as needed. Reported on 06/05/2015    amphetamine-dextroamphetamine (ADDERALL) 30 MG tablet Take 30 mg by mouth 2 (two) times daily.    aspirin EC 81 MG tablet Take 81 mg by mouth daily.    butalbital-acetaminophen-caffeine (FIORICET) 50-325-40 MG tablet Take 1 tablet by mouth every 6 (six) hours as needed for headache. Qty: 30 tablet, Refills: 5   Associated Diagnoses: Headache disorder    fluticasone (FLONASE) 50 MCG/ACT nasal spray Place 2 sprays into both nostrils daily. Qty: 16 g, Refills: 5   Associated Diagnoses: Allergic rhinitis, unspecified allergic rhinitis type    metoprolol (LOPRESSOR) 50 MG tablet Take 1 tablet (50 mg total) by mouth 2 (two) times daily. Qty: 60 tablet, Refills: 5   Associated Diagnoses: Coronary artery disease involving native coronary artery of native heart with angina pectoris ([place]); Hypertension goal BP (blood pressure) < 140/90    rosuvastatin (CRESTOR) 20 MG tablet Take 1 tablet (20 mg total) by mouth daily.  Qty: 90 tablet, Refills: 1   Associated Diagnoses: HLD (hyperlipidemia)    TRINTELLIX 10 MG TABS Take 1 tablet by mouth daily.    zolpidem (AMBIEN) 10 MG tablet Take 10 mg by mouth at bedtime as needed.          If you experience worsening of your admission symptoms, develop shortness of breath, life threatening emergency, suicidal or homicidal thoughts you must seek medical attention immediately by calling 911 or calling your MD immediately  if symptoms  less severe.  You Must read complete instructions/literature along with all the possible adverse reactions/side effects for all the Medicines you take and that have been prescribed to you. Take any new Medicines after you have completely understood and accept all the possible adverse reactions/side effects.   Please note  You were cared for by a hospitalist during your hospital stay. If you have any questions about your discharge medications or the care you received while you were in the hospital after you are discharged, you can call the unit and asked to speak with the hospitalist on call if the hospitalist that took care of you is not available. Once you are discharged, your primary care physician will handle any further medical issues. Please note that NO REFILLS for any discharge medications will be authorized once you are discharged, as it is imperative that you return to your primary care physician (or establish a relationship with a primary care physician if you do not have one) for your aftercare needs so that they can reassess your need for medications and monitor your lab values. Today   SUBJECTIVE   headache  VITAL SIGNS:  Blood pressure 127/70, pulse 86, temperature 98.1 F (36.7 C), temperature source Oral, resp. rate 18, height 5\' 6"  (1.676 m), weight 87.544 kg (193 lb), SpO2 96 %.  I/O:   Intake/Output Summary (Last 24 hours) at 09/22/15 0751 Last data filed at 09/22/15 0422  Gross per 24 hour  Intake    870 ml  Output   2850 ml  Net  -1980 ml    PHYSICAL EXAMINATION:  GENERAL:  60 y.o.-year-old patient lying in the bed with no acute distress.  EYES: Pupils equal, round, reactive to light and accommodation. No scleral icterus. Extraocular muscles intact.  HEENT: Head atraumatic, normocephalic. Oropharynx and nasopharynx clear.  NECK:  Supple, no jugular venous distention. No thyroid enlargement, no tenderness.  LUNGS: Normal breath sounds bilaterally, no wheezing,  rales,rhonchi or crepitation. No use of accessory muscles of respiration.  CARDIOVASCULAR: S1, S2 normal. No murmurs, rubs, or gallops.  ABDOMEN: Soft, non-tender, non-distended. Bowel sounds present. No organomegaly or mass.  EXTREMITIES: No pedal edema, cyanosis, or clubbing.  NEUROLOGIC: Cranial nerves II through XII are intact. Muscle strength 5/5 in all extremities. Sensation intact. Gait not checked.  PSYCHIATRIC: The patient is alert and oriented x 3.  SKIN: No obvious rash, lesion, or ulcer.   DATA REVIEW:   CBC   Recent Labs Lab 09/20/15 2336  09/21/15 1145  WBC 10.6  --   --   HGB 12.9  < > 11.8*  HCT 38.0  < > 35.5  PLT 306  --   --   < > = values in this interval not displayed.  Chemistries   Recent Labs Lab 09/20/15 2336 09/21/15 0531  NA 137 140  K 3.6 3.8  CL 105 110  CO2 23 23  GLUCOSE 95 87  BUN 15 12  CREATININE 0.81 0.73  CALCIUM 8.9 8.0*  AST 20  --   ALT 17  --   ALKPHOS 62  --   BILITOT 0.3  --    Management plans discussed with the patient, family and they are in agreement.  CODE STATUS:     Code Status Orders        Start     Ordered   09/21/15 0229  Full code   Continuous     09/21/15 0229    Code Status History    Date Active Date Inactive Code Status Order ID Comments User Context   This patient has a current code status but no historical code status.      TOTAL TIME TAKING CARE OF THIS PATIENT: 40 minutes.    Mushka Laconte M.D on 09/22/2015 at 7:51 AM  Between 7am to 6pm - Pager - [phone] After 6pm go to [email or web address] - password [email or web address]  Office  [phone]  CC: Primary care physician; Bobetta Lime, MD

## 2015-09-24 ENCOUNTER — Encounter: Payer: Self-pay | Admitting: Gastroenterology

## 2015-11-26 ENCOUNTER — Encounter: Payer: Self-pay | Admitting: *Deleted

## 2015-11-26 ENCOUNTER — Emergency Department: Payer: Medicare Other

## 2015-11-26 ENCOUNTER — Emergency Department
Admission: EM | Admit: 2015-11-26 | Discharge: 2015-11-27 | Disposition: A | Discharge status: Home / Self Care | Payer: Medicare Other | Attending: Emergency Medicine | Admitting: Emergency Medicine

## 2015-11-26 DIAGNOSIS — F909 Attention-deficit hyperactivity disorder, unspecified type: Secondary | ICD-10-CM | POA: Diagnosis not present

## 2015-11-26 DIAGNOSIS — R0789 Other chest pain: Secondary | ICD-10-CM | POA: Diagnosis not present

## 2015-11-26 DIAGNOSIS — I2510 Atherosclerotic heart disease of native coronary artery without angina pectoris: Secondary | ICD-10-CM | POA: Diagnosis not present

## 2015-11-26 DIAGNOSIS — Z79899 Other long term (current) drug therapy: Secondary | ICD-10-CM | POA: Diagnosis not present

## 2015-11-26 DIAGNOSIS — Z87891 Personal history of nicotine dependence: Secondary | ICD-10-CM | POA: Insufficient documentation

## 2015-11-26 DIAGNOSIS — R0602 Shortness of breath: Secondary | ICD-10-CM | POA: Diagnosis present

## 2015-11-26 DIAGNOSIS — J189 Pneumonia, unspecified organism: Secondary | ICD-10-CM | POA: Diagnosis not present

## 2015-11-26 DIAGNOSIS — I10 Essential (primary) hypertension: Secondary | ICD-10-CM | POA: Insufficient documentation

## 2015-11-26 DIAGNOSIS — Z7982 Long term (current) use of aspirin: Secondary | ICD-10-CM | POA: Insufficient documentation

## 2015-11-26 DIAGNOSIS — R079 Chest pain, unspecified: Secondary | ICD-10-CM | POA: Diagnosis not present

## 2015-11-26 LAB — COMPREHENSIVE METABOLIC PANEL
ALT: 13 U/L — ABNORMAL LOW (ref 14–54)
AST: 20 U/L (ref 15–41)
Albumin: 3.5 g/dL (ref 3.5–5.0)
Alkaline Phosphatase: 70 U/L (ref 38–126)
Anion gap: 3 — ABNORMAL LOW (ref 5–15)
BUN: 13 mg/dL (ref 6–20)
CO2: 29 mmol/L (ref 22–32)
Calcium: 8.1 mg/dL — ABNORMAL LOW (ref 8.9–10.3)
Chloride: 109 mmol/L (ref 101–111)
Creatinine, Ser: 0.71 mg/dL (ref 0.44–1.00)
GFR calc Af Amer: >60 mL/min (ref >60)
GFR calc non Af Amer: >60 mL/min (ref >60)
Glucose, Bld: 111 mg/dL — ABNORMAL HIGH (ref 65–99)
Potassium: 3.7 mmol/L (ref 3.5–5.1)
Sodium: 141 mmol/L (ref 135–145)
Total Bilirubin: 0.3 mg/dL (ref 0.3–1.2)
Total Protein: 6.5 g/dL (ref 6.5–8.1)

## 2015-11-26 LAB — CBC WITH DIFFERENTIAL/PLATELET
Basophils Absolute: 0.1 10*3/uL (ref 0–0.1)
Basophils Relative: 0 %
Eosinophils Absolute: 0.2 10*3/uL (ref 0–0.7)
Eosinophils Relative: 1 %
HCT: 35.5 % (ref 35.0–47.0)
Hemoglobin: 11.7 g/dL — ABNORMAL LOW (ref 12.0–16.0)
Lymphocytes Relative: 13 %
Lymphs Abs: 2.7 10*3/uL (ref 1.0–3.6)
MCH: 30.5 pg (ref 26.0–34.0)
MCHC: 32.9 g/dL (ref 32.0–36.0)
MCV: 92.5 fL (ref 80.0–100.0)
Monocytes Absolute: 1.8 10*3/uL — ABNORMAL HIGH (ref 0.2–0.9)
Monocytes Relative: 9 %
Neutro Abs: 16.1 10*3/uL — ABNORMAL HIGH (ref 1.4–6.5)
Neutrophils Relative %: 77 %
Platelets: 336 10*3/uL (ref 150–440)
RBC: 3.83 MIL/uL (ref 3.80–5.20)
RDW: 13.7 % (ref 11.5–14.5)
WBC: 20.9 10*3/uL — ABNORMAL HIGH (ref 3.6–11.0)

## 2015-11-26 LAB — TROPONIN I: Troponin I: 0.04 ng/mL (ref <0.03)

## 2015-11-26 NOTE — ED Provider Notes (Addendum)
[facility] Emergency Department Provider Note    ____________________________________________   I have reviewed the triage vital signs and the nursing notes.   HISTORY  Chief Complaint Shortness of Breath and Chest Pain   History limited by: Not Limited   HPI Linda Mejia is a 60 y.o. female who presents to the emergency department today because of concerns for chest pain and shortness of breath. The patient states that for the past couple days she has been feeling more fatigued. She does not however have any specific symptoms. This morning she was woken at about 4 AM with chills. Throughout the day she has been feeling ill. She has had shortness of breath and chest pain and back pain. They have been constant today.     Past Medical History:  Diagnosis Date   ADHD (attention deficit hyperactivity disorder)    Anxiety    Chronic back pain    Coronary artery disease    Herpes genitalis in women    History of stomach ulcers    x7 this year. Bleeding   Hypertension     Patient Active Problem List   Diagnosis Date Noted   GI bleed 09/21/2015   Hematemesis    Reflux esophagitis    Other diseases of stomach and duodenum    Duodenitis    Subclinical hypothyroidism 06/06/2015   Multiple joint pain 06/05/2015   Other fatigue 06/05/2015   Night sweats 06/05/2015   Screening for tuberculosis 06/05/2015   Headache 04/12/2015   Subclinical hypothyroidism 03/26/2015   Herpes simplex type 2 infection 03/21/2015   Rhinitis, allergic 03/21/2015   Cervical pain 09/21/2014   ADD (attention deficit hyperactivity disorder, inattentive type) 09/20/2014   Episodic paroxysmal anxiety disorder 09/20/2014   History of gastric ulcer 09/20/2014       Breathlessness on exertion 08/28/2014   HLD (hyperlipidemia) 10/03/2009   Coronary artery disease involving native coronary artery of native heart with angina pectoris ([facility]) 10/02/2009   Hypertension goal BP (blood  pressure) < 140/90 10/02/2009     Past Surgical History:  Procedure Laterality Date   CARDIAC CATHETERIZATION     CESAREAN SECTION     ESOPHAGOGASTRODUODENOSCOPY (EGD) WITH PROPOFOL N/A 09/21/2015   Procedure: ESOPHAGOGASTRODUODENOSCOPY (EGD) WITH PROPOFOL;  Surgeon: Midge Minium, MD;  Location: [facility] ENDOSCOPY;  Service: Endoscopy;  Laterality: N/A;   TONSILLECTOMY     TUBAL LIGATION      Prior to Admission medications   Medication Sig Start Date End Date Taking? Authorizing Provider  acyclovir (ZOVIRAX) 400 MG tablet Take 1 tablet by mouth 2 (two) times daily as needed. Reported on 06/05/2015 03/20/15   Historical Provider, MD  amphetamine-dextroamphetamine (ADDERALL) 30 MG tablet Take 30 mg by mouth 2 (two) times daily.    Historical Provider, MD  aspirin EC 81 MG tablet Take 81 mg by mouth daily.    Historical Provider, MD  butalbital-acetaminophen-caffeine (FIORICET) 50-325-40 MG tablet Take 1 tablet by mouth every 6 (six) hours as needed for headache. 03/21/15 03/20/16  Edwena Felty, MD  diazepam (VALIUM) 10 MG tablet Take 1 tablet (10 mg total) by mouth every 8 (eight) hours as needed for anxiety. 09/22/15   Enedina Finner, MD  fluticasone (FLONASE) 50 MCG/ACT nasal spray Place 2 sprays into both nostrils daily. 03/21/15   Edwena Felty, MD  metoprolol (LOPRESSOR) 50 MG tablet Take 1 tablet (50 mg total) by mouth 2 (two) times daily. 03/21/15   Edwena Felty, MD  omeprazole (PRILOSEC) 20 MG capsule Take  2 capsules (40 mg total) by mouth 2 (two) times daily before a meal. 09/22/15   Enedina Finner, MD  rosuvastatin (CRESTOR) 20 MG tablet Take 1 tablet (20 mg total) by mouth daily. 03/26/15   Edwena Felty, MD  TRINTELLIX 10 MG TABS Take 1 tablet by mouth daily. 09/06/15   Historical Provider, MD  zolpidem (AMBIEN) 10 MG tablet Take 10 mg by mouth at bedtime as needed.      Historical Provider, MD    Allergies Review of patient's allergies indicates no known allergies.  Family History   Problem Relation Age of Onset   Diabetes Mother    Heart disease Father     Social History Social History  Substance Use Topics   Smoking status: Former Smoker    Packs/day: 1.00    Years: 30.00   Smokeless tobacco: Never Used   Alcohol use Yes     Comment: occasionally    Review of Systems Constitutional: Positive for chills. Cardiovascular: Positive for chest pain. Respiratory: Positive for shortness of breath. Gastrointestinal: Negative for abdominal pain, vomiting and diarrhea. Genitourinary: Negative for dysuria. Musculoskeletal: Positive for back pain. Skin: Negative for rash. Neurological: Negative for headaches, focal weakness or numbness.  10-point ROS otherwise negative.  ____________________________________________   PHYSICAL EXAM:  VITAL SIGNS: ED Triage Vitals  Enc Vitals Group     BP 11/26/15 2138 (!) 125/96     Pulse Rate 11/26/15 2138 86     Resp 11/26/15 2138 20     Temp 11/26/15 2138 98 F (36.7 C)     Temp Source 11/26/15 2138 Oral     SpO2 11/26/15 2138 94 %     Weight 11/26/15 2138 180 lb (81.6 kg)     Height 11/26/15 2138 5\' 5"  (1.651 m)     Head Circumference --      Peak Flow --      Pain Score 11/26/15 2139 4   Constitutional: Alert and oriented. Well appearing and in no distress. Eyes: Conjunctivae are normal. PERRL. Normal extraocular movements. ENT      Head: Normocephalic and atraumatic.      Nose: No congestion/rhinnorhea.      Mouth/Throat: Mucous membranes are moist.      Neck: No stridor. Hematological/Lymphatic/Immunilogical: No cervical lymphadenopathy. Cardiovascular: Normal rate, regular rhythm.  No murmurs, rubs, or gallops. Respiratory: Normal respiratory effort without tachypnea nor retractions. Breath sounds are clear and equal bilaterally. No wheezes/rales/rhonchi. Gastrointestinal: Soft and nontender. No distention. There is no CVA tenderness. Genitourinary: Deferred Musculoskeletal: Normal range of motion in  all extremities. No joint effusions.  No lower extremity tenderness nor edema. Neurologic:  Normal speech and language. No gross focal neurologic deficits are appreciated.  Skin:  Skin is warm, dry and intact. No rash noted. Psychiatric: Mood and affect are normal. Speech and behavior are normal. Patient exhibits appropriate insight and judgment.  ____________________________________________    LABS (pertinent positives/negatives)  Labs Reviewed  TROPONIN I - Abnormal; Notable for the following:       Result Value   Troponin I 0.04 (*)    All other components within normal limits  CBC WITH DIFFERENTIAL/PLATELET - Abnormal; Notable for the following:    WBC 20.9 (*)    Hemoglobin 11.7 (*)    Neutro Abs 16.1 (*)    Monocytes Absolute 1.8 (*)    All other components within normal limits  COMPREHENSIVE METABOLIC PANEL - Abnormal; Notable for the following:    Glucose, Bld 111 (*)  Calcium 8.1 (*)    ALT 13 (*)    Anion gap 3 (*)    All other components within normal limits  TROPONIN I  LACTIC ACID, PLASMA  LACTIC ACID, PLASMA     ____________________________________________   EKG  I, Phineas Semen, attending physician, personally viewed and interpreted this EKG  EKG Time: 2135 Rate: 88 Rhythm: normal sinus rhythm Axis: normal Intervals: qtc 447 QRS: narrow ST changes: no st elevation Impression: normal ekg   ____________________________________________    RADIOLOGY  CXR IMPRESSION: Patchy areas of apparent pneumonia on the right at multiple sites. Minimal scarring left base. Lungs elsewhere clear. Aortic atherosclerosis.  ____________________________________________   PROCEDURES  Procedures  ____________________________________________   INITIAL IMPRESSION / ASSESSMENT AND PLAN / ED COURSE  Pertinent labs & imaging results that were available during my care of the patient were reviewed by me and considered in my medical decision making (see  chart for details).   Patient presented to the emergency department today because of concerns for shortness of breath chest pain and chills. Workup was done prior to my examination and is consistent with pneumonia. Patient also had an elevated troponin at 0.04. At this point I highly doubt ACS and think likely the elevation is secondary to the infection. Will have her recheck a troponin as well as a lactic. Will plan on giving fluids and antibiotics.  Clinical Course   2nd troponin negative, lactic <1. Think initial elevated troponin unrelated to ACS. Will give patient prescription for antibiotics. Discussed repeat x-ray with patient. ____________________________________________   FINAL CLINICAL IMPRESSION(S) / ED DIAGNOSES  Final diagnoses:  Community acquired pneumonia     Note: This dictation was prepared with Office manager. Any transcriptional errors that result from this process are unintentional    Phineas Semen, MD 11/27/15 [phone]  Addendum: Patient requested that "Abuse, drug or alcohol Date noted 09/20/2014" be removed. It was removed from Patient Active Problem List.     Phineas Semen, MD 08/19/21 [phone]

## 2015-11-26 NOTE — ED Triage Notes (Signed)
Pt presents w/ c/o increased shortness of breath and chest pressure that is reproducible w/ inspiration. Pt has PMH of MI w/ occlusions. Pt also dx'd w/ gastric ulcers. Pt c/o general malaise x 1 day and cough.

## 2015-11-27 LAB — TROPONIN I: Troponin I: <0.03 ng/mL (ref <0.03)

## 2015-11-27 LAB — LACTIC ACID, PLASMA: Lactic Acid, Venous: 0.6 mmol/L (ref 0.5–1.9)

## 2015-11-27 MED ORDER — LEVOFLOXACIN 750 MG PO TABS: 750.0000 mg | ORAL_TABLET | Freq: Every day | ORAL | 0 refills | Status: AC

## 2015-11-27 MED ORDER — KETOROLAC TROMETHAMINE 30 MG/ML IJ SOLN
30.0000 mg | Freq: Once | INTRAMUSCULAR | Status: AC
  Administered 2015-11-27: 30 mg via INTRAVENOUS
  Filled 2015-11-27: qty 1

## 2015-11-27 MED ORDER — SODIUM CHLORIDE 0.9 % IV BOLUS (SEPSIS)
1000.0000 mL | Freq: Once | INTRAVENOUS | Status: AC
  Administered 2015-11-27: 1000 mL via INTRAVENOUS

## 2015-11-27 MED ORDER — LEVOFLOXACIN IN D5W 750 MG/150ML IV SOLN
750.0000 mg | Freq: Once | INTRAVENOUS | Status: AC
  Administered 2015-11-27: 750 mg via INTRAVENOUS
  Filled 2015-11-27: qty 150

## 2015-11-27 NOTE — ED Notes (Signed)
Pt given ice chips per Dr Archie Balboa.

## 2015-11-27 NOTE — Discharge Instructions (Signed)
We do recommend that you get a repeat chest x-ray in 3-4 weeks to make sure your pneumonia has improved. Please seek medical attention for any high fevers, chest pain, shortness of breath, change in behavior, persistent vomiting, bloody stool or any other new or concerning symptoms.

## 2015-11-27 NOTE — ED Notes (Signed)
Pt. Verbalizes understanding of d/c instructions, prescriptions, and follow-up. VS stable and pain controlled per pt.  Pt. In NAD at time of d/c and denies further concerns regarding this visit. Pt.ambulatory Out of the unit with steady gait with daughter. Pt advised to return to the ED at any time for emergent concerns, or for new/worsening symptoms.

## 2015-11-28 ENCOUNTER — Emergency Department: Payer: Medicare Other

## 2015-11-28 ENCOUNTER — Emergency Department
Admission: EM | Admit: 2015-11-28 | Discharge: 2015-11-28 | Disposition: A | Discharge status: Home / Self Care | Payer: Medicare Other | Attending: Emergency Medicine | Admitting: Emergency Medicine

## 2015-11-28 ENCOUNTER — Encounter: Payer: Self-pay | Admitting: *Deleted

## 2015-11-28 DIAGNOSIS — R1084 Generalized abdominal pain: Secondary | ICD-10-CM

## 2015-11-28 DIAGNOSIS — Z7982 Long term (current) use of aspirin: Secondary | ICD-10-CM | POA: Diagnosis not present

## 2015-11-28 DIAGNOSIS — I2510 Atherosclerotic heart disease of native coronary artery without angina pectoris: Secondary | ICD-10-CM | POA: Insufficient documentation

## 2015-11-28 DIAGNOSIS — R0602 Shortness of breath: Secondary | ICD-10-CM | POA: Diagnosis present

## 2015-11-28 DIAGNOSIS — Z79899 Other long term (current) drug therapy: Secondary | ICD-10-CM | POA: Diagnosis not present

## 2015-11-28 DIAGNOSIS — R51 Headache: Secondary | ICD-10-CM | POA: Insufficient documentation

## 2015-11-28 DIAGNOSIS — Z87891 Personal history of nicotine dependence: Secondary | ICD-10-CM | POA: Diagnosis not present

## 2015-11-28 DIAGNOSIS — R5381 Other malaise: Secondary | ICD-10-CM

## 2015-11-28 DIAGNOSIS — I10 Essential (primary) hypertension: Secondary | ICD-10-CM | POA: Insufficient documentation

## 2015-11-28 DIAGNOSIS — F909 Attention-deficit hyperactivity disorder, unspecified type: Secondary | ICD-10-CM | POA: Diagnosis not present

## 2015-11-28 DIAGNOSIS — J189 Pneumonia, unspecified organism: Secondary | ICD-10-CM

## 2015-11-28 DIAGNOSIS — Z791 Long term (current) use of non-steroidal anti-inflammatories (NSAID): Secondary | ICD-10-CM | POA: Insufficient documentation

## 2015-11-28 DIAGNOSIS — R079 Chest pain, unspecified: Secondary | ICD-10-CM | POA: Diagnosis not present

## 2015-11-28 DIAGNOSIS — E038 Other specified hypothyroidism: Secondary | ICD-10-CM | POA: Diagnosis not present

## 2015-11-28 LAB — CBC
HCT: 34.4 % — ABNORMAL LOW (ref 35.0–47.0)
Hemoglobin: 11.6 g/dL — ABNORMAL LOW (ref 12.0–16.0)
MCH: 31.5 pg (ref 26.0–34.0)
MCHC: 33.7 g/dL (ref 32.0–36.0)
MCV: 93.5 fL (ref 80.0–100.0)
Platelets: 328 10*3/uL (ref 150–440)
RBC: 3.68 MIL/uL — ABNORMAL LOW (ref 3.80–5.20)
RDW: 13.9 % (ref 11.5–14.5)
WBC: 10.3 10*3/uL (ref 3.6–11.0)

## 2015-11-28 LAB — BASIC METABOLIC PANEL
Anion gap: 10 (ref 5–15)
BUN: 11 mg/dL (ref 6–20)
CO2: 23 mmol/L (ref 22–32)
Calcium: 8.4 mg/dL — ABNORMAL LOW (ref 8.9–10.3)
Chloride: 106 mmol/L (ref 101–111)
Creatinine, Ser: 0.62 mg/dL (ref 0.44–1.00)
GFR calc Af Amer: >60 mL/min (ref >60)
GFR calc non Af Amer: >60 mL/min (ref >60)
Glucose, Bld: 87 mg/dL (ref 65–99)
Potassium: 3.6 mmol/L (ref 3.5–5.1)
Sodium: 139 mmol/L (ref 135–145)

## 2015-11-28 LAB — TROPONIN I
Troponin I: 0.05 ng/mL (ref <0.03)
Troponin I: <0.03 ng/mL (ref <0.03)

## 2015-11-28 LAB — CK: Total CK: 71 U/L (ref 38–234)

## 2015-11-28 MED ORDER — ACETAMINOPHEN 500 MG PO TABS
500.0000 mg | ORAL_TABLET | Freq: Once | ORAL | Status: AC
  Administered 2015-11-28: 500 mg via ORAL

## 2015-11-28 MED ORDER — OXYCODONE-ACETAMINOPHEN 5-325 MG PO TABS
ORAL_TABLET | ORAL | Status: AC
  Administered 2015-11-28: 1 via ORAL
  Filled 2015-11-28: qty 1

## 2015-11-28 MED ORDER — ACETAMINOPHEN 500 MG PO TABS
ORAL_TABLET | ORAL | Status: AC
  Filled 2015-11-28: qty 2

## 2015-11-28 MED ORDER — OXYCODONE-ACETAMINOPHEN 5-325 MG PO TABS
1.0000 | ORAL_TABLET | Freq: Once | ORAL | Status: AC
  Administered 2015-11-28: 1 via ORAL

## 2015-11-28 MED ORDER — OXYCODONE-ACETAMINOPHEN 5-325 MG PO TABS: 1.0000 | ORAL_TABLET | Freq: Four times a day (QID) | ORAL | 0 refills | Status: DC | PRN

## 2015-11-28 MED ORDER — ONDANSETRON 4 MG PO TBDP: 4.0000 mg | ORAL_TABLET | Freq: Three times a day (TID) | ORAL | 0 refills | Status: DC | PRN

## 2015-11-28 NOTE — ED Provider Notes (Signed)
[facility] Emergency Department Provider Note  ____________________________________________  Time seen: Approximately 5:23 PM  I have reviewed the triage vital signs and the nursing notes.   HISTORY  Chief Complaint Chest Pain and Shortness of Breath    HPI Linda Mejia is a 60 y.o. female who complains of generalized abdominal pain and malaise fatigue and nausea in the setting of Levaquin use for the past 48 hours as treatment for community acquired pneumonia. She is eating and drinking. She reports some shortness of breath and chest pain it's worse with breathing but not exertional. No fevers chills or sweats dizziness or syncope. She just states "I don't feel good"     Past Medical History:  Diagnosis Date  . ADHD (attention deficit hyperactivity disorder)   . Anxiety   . Chronic back pain   . Coronary artery disease   . Herpes genitalis in women   . History of stomach ulcers    x7 this year. Bleeding  . Hypertension      Patient Active Problem List   Diagnosis Date Noted  . GI bleed 09/21/2015  . Hematemesis   . Reflux esophagitis   . Other diseases of stomach and duodenum   . Duodenitis   . Subclinical hypothyroidism 06/06/2015  . Multiple joint pain 06/05/2015  . Other fatigue 06/05/2015  . Night sweats 06/05/2015  . Screening for tuberculosis 06/05/2015  . Headache 04/12/2015  . Subclinical hypothyroidism 03/26/2015  . Herpes simplex type 2 infection 03/21/2015  . Rhinitis, allergic 03/21/2015  . Cervical pain 09/21/2014  . ADD (attention deficit hyperactivity disorder, inattentive type) 09/20/2014  . Episodic paroxysmal anxiety disorder 09/20/2014  . History of gastric ulcer 09/20/2014  . Abuse, drug or alcohol 09/20/2014  . Breathlessness on exertion 08/28/2014  . HLD (hyperlipidemia) 10/03/2009  . Coronary artery disease involving native coronary artery of native heart with angina pectoris ([place]) 10/02/2009  . Hypertension goal  BP (blood pressure) < 140/90 10/02/2009     Past Surgical History:  Procedure Laterality Date  . CARDIAC CATHETERIZATION    . CESAREAN SECTION    . ESOPHAGOGASTRODUODENOSCOPY (EGD) WITH PROPOFOL N/A 09/21/2015   Procedure: ESOPHAGOGASTRODUODENOSCOPY (EGD) WITH PROPOFOL;  Surgeon: Lucilla Lame, MD;  Location: [facility] ENDOSCOPY;  Service: Endoscopy;  Laterality: N/A;  . TONSILLECTOMY    . TUBAL LIGATION       Prior to Admission medications   Medication Sig Start Date End Date Taking? Authorizing Provider  acyclovir (ZOVIRAX) 400 MG tablet Take 1 tablet by mouth 2 (two) times daily as needed. Reported on 06/05/2015 03/20/15  Yes Historical Provider, MD  amphetamine-dextroamphetamine (ADDERALL) 30 MG tablet Take 30 mg by mouth 2 (two) times daily.   Yes Historical Provider, MD  aspirin EC 81 MG tablet Take 81 mg by mouth daily.   Yes Historical Provider, MD  butalbital-acetaminophen-caffeine (FIORICET) 50-325-40 MG tablet Take 1 tablet by mouth every 6 (six) hours as needed for headache. 03/21/15 03/20/16 Yes Bobetta Lime, MD  diazepam (VALIUM) 10 MG tablet Take 1 tablet (10 mg total) by mouth every 8 (eight) hours as needed for anxiety. 09/22/15  Yes Fritzi Mandes, MD  fluticasone (FLONASE) 50 MCG/ACT nasal spray Place 2 sprays into both nostrils daily. 03/21/15  Yes Bobetta Lime, MD  ibuprofen (ADVIL,MOTRIN) 400 MG tablet Take 400 mg by mouth every 6 (six) hours as needed.   Yes Historical Provider, MD  levofloxacin (LEVAQUIN) 750 MG tablet Take 1 tablet (750 mg total) by mouth daily. 11/27/15 12/04/15 Yes  Nance Pear, MD  metoprolol (LOPRESSOR) 50 MG tablet Take 1 tablet (50 mg total) by mouth 2 (two) times daily. 03/21/15  Yes Bobetta Lime, MD  omeprazole (PRILOSEC) 20 MG capsule Take 2 capsules (40 mg total) by mouth 2 (two) times daily before a meal. 09/22/15  Yes Fritzi Mandes, MD  rosuvastatin (CRESTOR) 20 MG tablet Take 1 tablet (20 mg total) by mouth daily. 03/26/15  Yes Bobetta Lime, MD   zolpidem (AMBIEN) 10 MG tablet Take 10 mg by mouth at bedtime as needed.     Yes Historical Provider, MD  ondansetron (ZOFRAN ODT) 4 MG disintegrating tablet Take 1 tablet (4 mg total) by mouth every 8 (eight) hours as needed for nausea or vomiting. 11/28/15   Carrie Mew, MD  oxyCODONE-acetaminophen (ROXICET) 5-325 MG tablet Take 1 tablet by mouth every 6 (six) hours as needed for severe pain. 11/28/15   Carrie Mew, MD     Allergies Review of patient's allergies indicates no known allergies.   Family History  Problem Relation Age of Onset  . Diabetes Mother   . Heart disease Father     Social History Social History  Substance Use Topics  . Smoking status: Former Smoker    Packs/day: 1.00    Years: 30.00  . Smokeless tobacco: Never Used  . Alcohol use Yes     Comment: occasionally    Review of Systems  Constitutional:   No fever or chills.  ENT:   No sore throat. No rhinorrhea. Cardiovascular:   Positive chest pain. Respiratory:   Positive shortness of breath and nonproductive cough. Gastrointestinal:  Positive generalized abdominal pain with nausea but no vomiting or diarrhea.  Genitourinary:   Negative for dysuria or difficulty urinating. Musculoskeletal:   Negative for focal pain or swelling Neurological:   Positive bilateral frontal headache 10-point ROS otherwise negative.  ____________________________________________   PHYSICAL EXAM:  VITAL SIGNS: ED Triage Vitals  Enc Vitals Group     BP 11/28/15 1547 (!) 163/91     Pulse Rate 11/28/15 1547 80     Resp 11/28/15 1547 18     Temp 11/28/15 1547 98.2 F (36.8 C)     Temp Source 11/28/15 1547 Oral     SpO2 11/28/15 1547 97 %     Weight 11/28/15 1548 180 lb (81.6 kg)     Height 11/28/15 1548 5\' 5"  (1.651 m)     Head Circumference --      Peak Flow --      Pain Score 11/28/15 1548 5     Pain Loc --      Pain Edu? --      Excl. in [place]? --     Vital signs reviewed, nursing assessments  reviewed.   Constitutional:   Alert and oriented. Well appearing and in no distress. Eyes:   No scleral icterus. No conjunctival pallor. PERRL. EOMI.  No nystagmus. ENT   Head:   Normocephalic and atraumatic.   Nose:   No congestion/rhinnorhea. No septal hematoma   Mouth/Throat:   MMM, no pharyngeal erythema. No peritonsillar mass.    Neck:   No stridor. No SubQ emphysema. No meningismus. Hematological/Lymphatic/Immunilogical:   No cervical lymphadenopathy. Cardiovascular:   RRR. Symmetric bilateral radial and DP pulses.  No murmurs.  Respiratory:   Normal respiratory effort without tachypnea nor retractions. Breath sounds are clear and equal bilaterally. No wheezes/rales/rhonchi. Gastrointestinal:   Soft With mild left upper quadrant tenderness. Non distended. There is no CVA tenderness.  No  rebound, rigidity, or guarding. Genitourinary:   deferred Musculoskeletal:   Nontender with normal range of motion in all extremities. No joint effusions.  No lower extremity tenderness.  No edema. Mild tenderness over the sternum Neurologic:   Normal speech and language.  CN 2-10 normal. Motor grossly intact. No gross focal neurologic deficits are appreciated.  Skin:    Skin is warm, dry and intact. No rash noted.  No petechiae, purpura, or bullae.  ____________________________________________    LABS (pertinent positives/negatives) (all labs ordered are listed, but only abnormal results are displayed) Labs Reviewed  BASIC METABOLIC PANEL - Abnormal; Notable for the following:       Result Value   Calcium 8.4 (*)    All other components within normal limits  CBC - Abnormal; Notable for the following:    RBC 3.68 (*)    Hemoglobin 11.6 (*)    HCT 34.4 (*)    All other components within normal limits  TROPONIN I - Abnormal; Notable for the following:    Troponin I 0.05 (*)    All other components within normal limits  CK  TROPONIN I    ____________________________________________   EKG  Interpreted by me Normal sinus rhythm rate of 86, normal axis intervals QRS ST segments and T waves. Voltage criteria for LVH in the high lateral leads  ____________________________________________    RADIOLOGY  Chest x-ray shows persistent basilar opacity consistent with pneumonia  ____________________________________________   PROCEDURES Procedures  ____________________________________________   INITIAL IMPRESSION / ASSESSMENT AND PLAN / ED COURSE  Pertinent labs & imaging results that were available during my care of the patient were reviewed by me and considered in my medical decision making (see chart for details).  Patient well appearing no acute distress. Presents for follow-up evaluation of symptoms related to pneumonia and treatment with Levaquin. Likely many of her symptoms are related to side effects of the Levaquin but nothing severe.Considering the patient's symptoms, medical history, and physical examination today, I have low suspicion for ACS, PE, TAD, pneumothorax, carditis, mediastinitis, pneumonia, CHF, or sepsis. I have low suspicion for cholecystitis or biliary pathology, pancreatitis, perforation or bowel obstruction, hernia, intra-abdominal abscess, AAA or dissection, volvulus or intussusception, mesenteric ischemia, or appendicitis.  Patient initial labs unremarkable, troponin very slightly elevated, likely erroneous as her presentation is not at all consistent with myocarditis endocarditis or ACS. Repeat troponin negative. Patient feeling better, will treat nausea and pain and have her follow up with primary care. Patient instructed to follow up with cardiology within a week after she is feeling better. Patient is agreeable to this and comfortable with the plan.     Clinical Course  Value Comment By Time  Troponin I: <0.03 Repeat troponin negative.  Carrie Mew, MD 08/16 2000    ____________________________________________   FINAL CLINICAL IMPRESSION(S) / ED DIAGNOSES  Final diagnoses:  Malaise  Generalized abdominal pain  CAP (community acquired pneumonia)       Portions of this note were generated with dragon dictation software. Dictation errors may occur despite best attempts at proofreading.    Carrie Mew, MD 11/28/15 2032

## 2015-11-28 NOTE — ED Notes (Signed)
Discharge instructions reviewed with patient. Patient verbalized understanding. Patient ambulated to lobby without difficulty.   

## 2015-11-28 NOTE — ED Triage Notes (Signed)
Pt arrived to ED reporting increased SOB and increased centralized chest pain from when pt was seen and dx with pneumonia on Monday. Pt reports since she was d/c pt has had left sided jaw pain, back pain and headache. Pt reports having a "sick" feeling all over but denies vomiting and diarrhea. Pt reports also having had multiple episodes of diaphoresis over the past couple nights.

## 2015-11-28 NOTE — Discharge Instructions (Signed)
Pain Medicine Instructions °HOW CAN PAIN MEDICINE AFFECT ME? °You were given a prescription for pain medicine. This medicine may make you tired or drowsy and may affect your ability to think clearly. Pain medicine may also affect your ability to drive or perform certain physical activities. It may not be possible to make all of your pain go away, but you should be comfortable enough to move, breathe, and take care of yourself. °HOW OFTEN SHOULD I TAKE PAIN MEDICINE AND HOW MUCH SHOULD I TAKE? °Take pain medicine only as directed by your health care provider and only as needed for pain. °You do not need to take pain medicine if you are not having pain, unless directed by your health care provider. °You can take less than the prescribed dose if you find that a smaller amount of medicine controls your pain. °WHAT RESTRICTIONS DO I HAVE WHILE TAKING PAIN MEDICINE? °Follow these instructions after you start taking pain medicine, while you are taking the medicine, and for 8 hours after you stop taking the medicine: °Do not drive. °Do not operate machinery. °Do not operate power tools. °Do not sign legal documents. °Do not drink alcohol. °Do not take sleeping pills. °Do not supervise children by yourself. °Do not participate in activities that require climbing or being in high places. °Do not enter a body of water--such as a lake, river, ocean, spa, or swimming pool--without an adult nearby who can monitor and help you. °HOW CAN I KEEP OTHERS SAFE WHILE I AM TAKING PAIN MEDICINE? °Store your pain medicine as directed by your health care provider. Make sure that it is placed where children and pets cannot reach it. °Never share your pain medicine with anyone. °Do not save any leftover pills. If you have any leftover pain medicine, get rid of it or destroy it as directed by your health care provider. °WHAT ELSE DO I NEED TO KNOW ABOUT TAKING PAIN MEDICINE? °Use a stool softener if you become constipated from your pain  medicine. Increasing your intake of fruits and vegetables will also help with constipation. °Write down the times when you take your pain medicine. Look at the times before you take your next dose of medicine. It is easy to become confused while on pain medicine. Recording the times helps you to avoid an overdose. °If your pain is severe, do not try to treat it yourself by taking more pills than instructed on your prescription. Contact your health care provider for help. °You may have been prescribed a pain medicine that contains acetaminophen. Do not take any other acetaminophen while taking this medicine. An overdose of acetaminophen can result in severe liver damage. Acetaminophen is found in many over-the-counter (OTC) and prescription medicines. If you are taking any medicines in addition to your pain medicine, check the active ingredients on those medicines to see if acetaminophen is listed. °WHEN SHOULD I CALL MY HEALTH CARE PROVIDER? °Your medicine is not helping to make the pain go away. °You vomit or have diarrhea shortly after taking the medicine. °You develop new pain in areas that did not hurt before. °You have an allergic reaction to your medicine. This may include: °Itchiness. °Swelling. °Dizziness. °Developing a new rash. °WHEN SHOULD I CALL 911 OR GO TO THE EMERGENCY ROOM? °You feel dizzy or you faint. °You are very confused or disoriented. °You repeatedly vomit. °Your skin or lips turn pale or bluish in color. °You have shortness of breath or you are breathing much more slowly than usual. °You have   a severe allergic reaction to your medicine. This includes: °Developing tongue swelling. °Having difficulty breathing. °  °This information is not intended to replace advice given to you by your health care provider. Make sure you discuss any questions you have with your health care provider. °  °Document Released: 07/07/2000 Document Revised: 08/15/2014 Document Reviewed: 02/02/2014 °Elsevier  Interactive Patient Education ©2016 [employer] ° °

## 2015-12-04 ENCOUNTER — Encounter (HOSPITAL_COMMUNITY): Payer: Self-pay

## 2015-12-04 ENCOUNTER — Emergency Department (HOSPITAL_COMMUNITY)
Admission: EM | Admit: 2015-12-04 | Discharge: 2015-12-04 | Disposition: A | Discharge status: Home / Self Care | Payer: Medicare Other | Attending: Emergency Medicine | Admitting: Emergency Medicine

## 2015-12-04 ENCOUNTER — Encounter: Payer: Self-pay | Admitting: Emergency Medicine

## 2015-12-04 ENCOUNTER — Emergency Department (HOSPITAL_COMMUNITY): Payer: Medicare Other

## 2015-12-04 ENCOUNTER — Emergency Department
Admission: EM | Admit: 2015-12-04 | Discharge: 2015-12-04 | Disposition: A | Discharge status: Home / Self Care | Payer: Medicare Other | Attending: Emergency Medicine | Admitting: Emergency Medicine

## 2015-12-04 DIAGNOSIS — I2510 Atherosclerotic heart disease of native coronary artery without angina pectoris: Secondary | ICD-10-CM | POA: Diagnosis not present

## 2015-12-04 DIAGNOSIS — R0602 Shortness of breath: Secondary | ICD-10-CM | POA: Diagnosis not present

## 2015-12-04 DIAGNOSIS — R079 Chest pain, unspecified: Secondary | ICD-10-CM | POA: Insufficient documentation

## 2015-12-04 DIAGNOSIS — R531 Weakness: Secondary | ICD-10-CM | POA: Diagnosis not present

## 2015-12-04 DIAGNOSIS — Z7982 Long term (current) use of aspirin: Secondary | ICD-10-CM | POA: Insufficient documentation

## 2015-12-04 DIAGNOSIS — F909 Attention-deficit hyperactivity disorder, unspecified type: Secondary | ICD-10-CM | POA: Insufficient documentation

## 2015-12-04 DIAGNOSIS — M25562 Pain in left knee: Secondary | ICD-10-CM | POA: Diagnosis not present

## 2015-12-04 DIAGNOSIS — Z87891 Personal history of nicotine dependence: Secondary | ICD-10-CM | POA: Insufficient documentation

## 2015-12-04 DIAGNOSIS — Z5321 Procedure and treatment not carried out due to patient leaving prior to being seen by health care provider: Secondary | ICD-10-CM | POA: Diagnosis not present

## 2015-12-04 DIAGNOSIS — Z79899 Other long term (current) drug therapy: Secondary | ICD-10-CM | POA: Insufficient documentation

## 2015-12-04 DIAGNOSIS — M25561 Pain in right knee: Secondary | ICD-10-CM | POA: Diagnosis not present

## 2015-12-04 DIAGNOSIS — I10 Essential (primary) hypertension: Secondary | ICD-10-CM | POA: Insufficient documentation

## 2015-12-04 LAB — CBC
HCT: 38.2 % (ref 36.0–46.0)
Hemoglobin: 12.2 g/dL (ref 12.0–15.0)
MCH: 30.5 pg (ref 26.0–34.0)
MCHC: 31.9 g/dL (ref 30.0–36.0)
MCV: 95.5 fL (ref 78.0–100.0)
Platelets: 387 10*3/uL (ref 150–400)
RBC: 4 MIL/uL (ref 3.87–5.11)
RDW: 13.4 % (ref 11.5–15.5)
WBC: 7.9 10*3/uL (ref 4.0–10.5)

## 2015-12-04 LAB — BASIC METABOLIC PANEL
Anion gap: 12 (ref 5–15)
BUN: 10 mg/dL (ref 6–20)
CO2: 22 mmol/L (ref 22–32)
Calcium: 8.9 mg/dL (ref 8.9–10.3)
Chloride: 103 mmol/L (ref 101–111)
Creatinine, Ser: 0.65 mg/dL (ref 0.44–1.00)
GFR calc Af Amer: >60 mL/min (ref >60)
GFR calc non Af Amer: >60 mL/min (ref >60)
Glucose, Bld: 129 mg/dL — ABNORMAL HIGH (ref 65–99)
Potassium: 3.2 mmol/L — ABNORMAL LOW (ref 3.5–5.1)
Sodium: 137 mmol/L (ref 135–145)

## 2015-12-04 LAB — I-STAT TROPONIN, ED: Troponin i, poc: 0.01 ng/mL (ref 0.00–0.08)

## 2015-12-04 NOTE — ED Notes (Addendum)
Pt ambulatory to stat desk without difficulty or distress noted, upset over wait time; st she left [place] due to their long wait; pt informed that all of her protocols results have been reviewed and that she is awaiting an exam room to be evaluated further by the ED provider; pt voices understanding

## 2015-12-04 NOTE — ED Notes (Signed)
Pt to desk upset, stating that her daughter called here and was told that she was discharged; explained to pt that I had not spoken with anyone regarding such and reassured her that she was still in line to be seen, that we were awaiting an exam room for the ED provider to evaluate her further

## 2015-12-04 NOTE — ED Triage Notes (Signed)
Pt seen at [facility] earlier today but left due to "traumas and 40 people in the lobby", didn't feel like waiting. Pt with chest pain and shortness of breath x1 week, was seen last week and dx with pneumonia, has finished antibiotics.

## 2015-12-04 NOTE — ED Notes (Signed)
Charge nurse out to take pt to an exam room; charge nurse informed that pt has left and her c/o

## 2015-12-04 NOTE — ED Notes (Addendum)
Daughter to desk to inquire over wait time; st she called here and was told that her mother had been discharged; explained to daughter that I had not spoken with anyone and relayed that message; explained that all of her protocol results had been received & reviewed by this nurse and that she was awaiting an exam room to be evaluated further by the ED provider;explained that since her initial troponin was WNL that we should go ahead and recheck it now for any changes; daughter voices understanding; pt to desk, upset over wait time, again explained situation, but pt st going to leave now and will call administration regarding her wait; pt upset that she had to wait in the ED lobby with her complaints; explained to pt & daughter purpose of protocols to r/o emergent conditions while waiting to be seen & reassured pt that her labs were reviewed and we would be rechecking them but pt cont to st leaving and requesting this nurse's name to report

## 2015-12-04 NOTE — ED Notes (Signed)
Pt is leaving to be seen at [facility]. RN advised pt against leaving. Pt insistent on going to different facility. Pt's family to drive her.

## 2015-12-04 NOTE — ED Triage Notes (Signed)
Onset chest pain and shortness of breath last week, worse yesterday and today, radiating to left jaw.  Pt feels tired, weak and bilateral legs swollen causing bilateral knee pain. Pt seen at [facility] and was found to have pneumonia, finished antibiotics.  Heart enzymes were noted to be high.  No cough.

## 2015-12-05 ENCOUNTER — Telehealth: Payer: Self-pay | Admitting: Emergency Medicine

## 2015-12-05 ENCOUNTER — Telehealth (HOSPITAL_BASED_OUTPATIENT_CLINIC_OR_DEPARTMENT_OTHER): Payer: Self-pay | Admitting: Emergency Medicine

## 2015-12-05 NOTE — Telephone Encounter (Signed)
Called patient due to lwot to inquire about condition and follow up plans. Pt says she is still sick.  I asked if she was going to pcp, esepcially since she is not better after antibiotics.   She says she has transportation issues.  I told her to call her doctor at least and tell them she is not better. Also that labs and a cxr were done in the ED as protocols.  She did not agree or disagree.

## 2015-12-27 ENCOUNTER — Encounter: Payer: Self-pay | Admitting: Family Medicine

## 2015-12-27 ENCOUNTER — Ambulatory Visit (INDEPENDENT_AMBULATORY_CARE_PROVIDER_SITE_OTHER): Payer: Medicare Other | Admitting: Family Medicine

## 2015-12-27 VITALS — BP 144/86 | HR 78 | Temp 98.0°F | Resp 14 | Wt 199.0 lb

## 2015-12-27 DIAGNOSIS — E669 Obesity, unspecified: Secondary | ICD-10-CM

## 2015-12-27 DIAGNOSIS — K2100 Gastro-esophageal reflux disease with esophagitis, without bleeding: Secondary | ICD-10-CM

## 2015-12-27 DIAGNOSIS — I10 Essential (primary) hypertension: Secondary | ICD-10-CM

## 2015-12-27 DIAGNOSIS — K254 Chronic or unspecified gastric ulcer with hemorrhage: Secondary | ICD-10-CM | POA: Diagnosis not present

## 2015-12-27 DIAGNOSIS — Z6836 Body mass index (BMI) 36.0-36.9, adult: Secondary | ICD-10-CM | POA: Insufficient documentation

## 2015-12-27 DIAGNOSIS — M170 Bilateral primary osteoarthritis of knee: Secondary | ICD-10-CM | POA: Insufficient documentation

## 2015-12-27 NOTE — Assessment & Plan Note (Signed)
Secondary to ulcers per patient; last ulcer in 2017; STOP NSAIDs; refer to GI; continue PPI for now

## 2015-12-27 NOTE — Assessment & Plan Note (Signed)
Encouraged pt to work on weight loss

## 2015-12-27 NOTE — Assessment & Plan Note (Signed)
Urged patient to talk to Dr. Kasandra Knudsen about high blood pressure, and the danger of being on stimulant medicine with CAD and HTN; urged her to stop until you can talk to him; urge to try DASH, see AVS

## 2015-12-27 NOTE — Assessment & Plan Note (Addendum)
Will try topicals and temperature (ice) and tylenol; avoid NSAIDs; get xrays; weight loss encouraged; I will not prescribe narcotics with her already taking benzodiazepine, zolpidem, and drinking alcohol

## 2015-12-27 NOTE — Assessment & Plan Note (Signed)
With bleeding ulcers per patient; refer to GI; never ever take NSAIDs if bleeding ulcers

## 2015-12-27 NOTE — Patient Instructions (Addendum)
Go from here to the xray building across the street  Please do see your cardiologist soon and have cholesterol checked and monitored  We'll have you see the gastroenterologist  Your goal blood pressure is less than 140 mmHg on top. Try to follow the DASH guidelines (DASH stands for Dietary Approaches to Stop Hypertension) Try to limit the sodium in your diet.  Ideally, consume less than 1.5 grams (less than 1,500mg ) per day. Do not add salt when cooking or at the table.  Check the sodium amount on labels when shopping, and choose items lower in sodium when given a choice. Avoid or limit foods that already contain a lot of sodium. Eat a diet rich in fruits and vegetables and whole grains.  I recommend you STOP all NSAIDs If you need something for aches or pains, try to use Tylenol (acetaminophen) instead of non-steroidals (which include Aleve, ibuprofen, Advil, Motrin, and naproxen); non-steroidals can cause long-term kidney damage and gastrointestinal bleeding and ulcers  Try topical agents like Biofreeze right over the knee joints Ask your pharmacist to show you which products are safe for you (I don't want you to have anything with aspirin or NSAIDs in it) Try ice application 3-4 times a day, 15-20 minutes at a time, protect skin with thin cloth to avoid thermal injury  Never ever mix alcohol with diazepam (Valium) and/or zolipdem (Ambien)  DASH Eating Plan DASH stands for "Dietary Approaches to Stop Hypertension." The DASH eating plan is a healthy eating plan that has been shown to reduce high blood pressure (hypertension). Additional health benefits may include reducing the risk of type 2 diabetes mellitus, heart disease, and stroke. The DASH eating plan may also help with weight loss. WHAT DO I NEED TO KNOW ABOUT THE DASH EATING PLAN? For the DASH eating plan, you will follow these general guidelines:  Choose foods with a percent daily value for sodium of less than 5% (as listed on the  food label).  Use salt-free seasonings or herbs instead of table salt or sea salt.  Check with your health care provider or pharmacist before using salt substitutes.  Eat lower-sodium products, often labeled as "lower sodium" or "no salt added."  Eat fresh foods.  Eat more vegetables, fruits, and low-fat dairy products.  Choose whole grains. Look for the word "whole" as the first word in the ingredient list.  Choose fish and skinless chicken or [place] more often than red meat. Limit fish, poultry, and meat to 6 oz (170 g) each day.  Limit sweets, desserts, sugars, and sugary drinks.  Choose heart-healthy fats.  Limit cheese to 1 oz (28 g) per day.  Eat more home-cooked food and less restaurant, buffet, and fast food.  Limit fried foods.  Cook foods using methods other than frying.  Limit canned vegetables. If you do use them, rinse them well to decrease the sodium.  When eating at a restaurant, ask that your food be prepared with less salt, or no salt if possible. WHAT FOODS CAN I EAT? Seek help from a dietitian for individual calorie needs. Grains Whole grain or whole wheat bread. Brown rice. Whole grain or whole wheat pasta. Quinoa, bulgur, and whole grain cereals. Low-sodium cereals. Corn or whole wheat flour tortillas. Whole grain cornbread. Whole grain crackers. Low-sodium crackers. Vegetables Fresh or frozen vegetables (raw, steamed, roasted, or grilled). Low-sodium or reduced-sodium tomato and vegetable juices. Low-sodium or reduced-sodium tomato sauce and paste. Low-sodium or reduced-sodium canned vegetables.  Fruits All fresh, canned (in  natural juice), or frozen fruits. Meat and Other Protein Products Ground beef (85% or leaner), grass-fed beef, or beef trimmed of fat. Skinless chicken or [place]. Ground chicken or [place]. Pork trimmed of fat. All fish and seafood. Eggs. Dried beans, peas, or lentils. Unsalted nuts and seeds. Unsalted canned beans. Dairy Low-fat  dairy products, such as skim or 1% milk, 2% or reduced-fat cheeses, low-fat ricotta or cottage cheese, or plain low-fat yogurt. Low-sodium or reduced-sodium cheeses. Fats and Oils Tub margarines without trans fats. Light or reduced-fat mayonnaise and salad dressings (reduced sodium). Avocado. Safflower, olive, or canola oils. Natural peanut or almond butter. Other Unsalted popcorn and pretzels. The items listed above may not be a complete list of recommended foods or beverages. Contact your dietitian for more options. WHAT FOODS ARE NOT RECOMMENDED? Grains White bread. White pasta. White rice. Refined cornbread. Bagels and croissants. Crackers that contain trans fat. Vegetables Creamed or fried vegetables. Vegetables in a cheese sauce. Regular canned vegetables. Regular canned tomato sauce and paste. Regular tomato and vegetable juices. Fruits Dried fruits. Canned fruit in light or heavy syrup. Fruit juice. Meat and Other Protein Products Fatty cuts of meat. Ribs, chicken wings, bacon, sausage, bologna, salami, chitterlings, fatback, hot dogs, bratwurst, and packaged luncheon meats. Salted nuts and seeds. Canned beans with salt. Dairy Whole or 2% milk, cream, half-and-half, and cream cheese. Whole-fat or sweetened yogurt. Full-fat cheeses or blue cheese. Nondairy creamers and whipped toppings. Processed cheese, cheese spreads, or cheese curds. Condiments Onion and garlic salt, seasoned salt, table salt, and sea salt. Canned and packaged gravies. Worcestershire sauce. Tartar sauce. Barbecue sauce. Teriyaki sauce. Soy sauce, including reduced sodium. Steak sauce. Fish sauce. Oyster sauce. Cocktail sauce. Horseradish. Ketchup and mustard. Meat flavorings and tenderizers. Bouillon cubes. Hot sauce. Tabasco sauce. Marinades. Taco seasonings. Relishes. Fats and Oils Butter, stick margarine, lard, shortening, ghee, and bacon fat. Coconut, palm kernel, or palm oils. Regular salad  dressings. Other Pickles and olives. Salted popcorn and pretzels. The items listed above may not be a complete list of foods and beverages to avoid. Contact your dietitian for more information. WHERE CAN I FIND MORE INFORMATION? National Heart, Lung, and Blood Institute: travelstabloid.com   This information is not intended to replace advice given to you by your health care provider. Make sure you discuss any questions you have with your health care provider.   Document Released: 03/20/2011 Document Revised: 04/21/2014 Document Reviewed: 02/02/2013 Elsevier Interactive Patient Education [employer].

## 2015-12-27 NOTE — Progress Notes (Signed)
BP (!) 144/86   Pulse 78   Temp 98 F (36.7 C) (Oral)   Resp 14   Wt 199 lb (90.3 kg)   SpO2 93%   BMI 32.12 kg/m    Subjective:    Patient ID: Linda Mejia, female    DOB: April 29, 1955, 60 y.o.   MRN: [id number]  HPI: Linda Mejia is a 60 y.o. female  Chief Complaint  Patient presents with  . Edema    bilateral knees   Patient is here for pain in the knees; she is new to me; her previous provider left the practice; the first thing I notice is that she has been to the ER seven times already in 2017  She says that her knees are swelling up; when she gets down, she can't get back up; she has swelling in both knees; pockets of fluid on both knees; they look good right now  She has had pneumonia and has been to the ER She has had seven ER visits this year alone, one was her heart She has coronary artery disease, sees cardiologist, Dr. Humphrey Rolls; saw him last year; he prescribes her cholesterol medicine She has ADHD and is on adderall and he knows about her CAD; patient is aware of the risk of a heart attack with that medicine; she is also on valium per psychiatrist as well; she takes [place] from Dr. Kasandra Knudsen She takes omeprazole BID, seven bleeding ulcers; she does not see a gastroenterologist; she is taking ibuprofen  Depression screen [facility] 2/9 12/27/2015 03/21/2015  Decreased Interest 0 3  Down, Depressed, Hopeless 3 3  PHQ - 2 Score 3 6  Altered sleeping 1 3  Tired, decreased energy 3 3  Change in appetite 1 1  Feeling bad or failure about yourself  2 3  Trouble concentrating 1 1  Moving slowly or fidgety/restless 1 0  Suicidal thoughts 1 0  PHQ-9 Score 13 17  Difficult doing work/chores Very difficult Very difficult  seeing psychiatrist; no thoughts now of hurting herself of others  Relevant past medical, surgical, family and social history reviewed Past Medical History:  Diagnosis Date  . ADHD (attention deficit hyperactivity disorder)   . Anxiety   . Chronic back pain   . Coronary  artery disease   . Herpes genitalis in women   . History of stomach ulcers    x7 this year. Bleeding  . Hypertension    Past Surgical History:  Procedure Laterality Date  . CARDIAC CATHETERIZATION    . CESAREAN SECTION    . ESOPHAGOGASTRODUODENOSCOPY (EGD) WITH PROPOFOL N/A 09/21/2015   Procedure: ESOPHAGOGASTRODUODENOSCOPY (EGD) WITH PROPOFOL;  Surgeon: Lucilla Lame, MD;  Location: [facility] ENDOSCOPY;  Service: Endoscopy;  Laterality: N/A;  . TONSILLECTOMY    . TUBAL LIGATION     Family History  Problem Relation Age of Onset  . Diabetes Mother   . Heart disease Father    Social History  Substance Use Topics  . Smoking status: Former Smoker    Packs/day: 1.00    Years: 30.00  . Smokeless tobacco: Never Used  . Alcohol use Yes     Comment: occasionally  drinks alcohol, maybe a six pack on the weekends  Interim medical history since last visit reviewed. Allergies and medications reviewed  Review of Systems Per HPI unless specifically indicated above     Objective:    BP (!) 144/86   Pulse 78   Temp 98 F (36.7 C) (Oral)   Resp 14  Wt 199 lb (90.3 kg)   SpO2 93%   BMI 32.12 kg/m   Wt Readings from Last 3 Encounters:  12/27/15 199 lb (90.3 kg)  12/04/15 180 lb (81.6 kg)  12/04/15 180 lb (81.6 kg)    Physical Exam  Constitutional: She appears well-developed and well-nourished. No distress.  obese  Eyes: EOM are normal. No scleral icterus.  Neck: No thyromegaly present.  Cardiovascular: Normal rate.   Pulmonary/Chest: Effort normal.  Abdominal: She exhibits no distension.  Musculoskeletal:       Right knee: She exhibits decreased range of motion and effusion (small). She exhibits no swelling. Tenderness found.       Left knee: She exhibits decreased range of motion and effusion (small). She exhibits no swelling. Tenderness found.  Crepitus bilaterally with active flexion and extension  Skin: No pallor.  Psychiatric: She has a normal mood and affect. Her behavior  is normal. Judgment and thought content normal. Her mood appears not anxious. She does not exhibit a depressed mood.   Results for orders placed or performed during the hospital encounter of [id number]  Basic metabolic panel  Result Value Ref Range   Sodium 137 135 - 145 mmol/L   Potassium 3.2 (L) 3.5 - 5.1 mmol/L   Chloride 103 101 - 111 mmol/L   CO2 22 22 - 32 mmol/L   Glucose, Bld 129 (H) 65 - 99 mg/dL   BUN 10 6 - 20 mg/dL   Creatinine, Ser 0.65 0.44 - 1.00 mg/dL   Calcium 8.9 8.9 - 10.3 mg/dL   GFR calc non Af Amer >60 >60 mL/min   GFR calc Af Amer >60 >60 mL/min   Anion gap 12 5 - 15  CBC  Result Value Ref Range   WBC 7.9 4.0 - 10.5 K/uL   RBC 4.00 3.87 - 5.11 MIL/uL   Hemoglobin 12.2 12.0 - 15.0 g/dL   HCT 38.2 36.0 - 46.0 %   MCV 95.5 78.0 - 100.0 fL   MCH 30.5 26.0 - 34.0 pg   MCHC 31.9 30.0 - 36.0 g/dL   RDW 13.4 11.5 - 15.5 %   Platelets 387 150 - 400 K/uL  I-stat troponin, ED  Result Value Ref Range   Troponin i, poc 0.01 0.00 - 0.08 ng/mL   Comment 3              Assessment & Plan:   Problem List Items Addressed This Visit      Cardiovascular and Mediastinum   Hypertension goal BP (blood pressure) < 140/90    Urged patient to talk to Dr. Kasandra Knudsen about high blood pressure, and the danger of being on stimulant medicine with CAD and HTN; urged her to stop until you can talk to him; urge to try DASH, see AVS        Digestive   Reflux esophagitis    With bleeding ulcers per patient; refer to GI; never ever take NSAIDs if bleeding ulcers      Relevant Orders   Ambulatory referral to Gastroenterology   GI bleed    Secondary to ulcers per patient; last ulcer in 2017; STOP NSAIDs; refer to GI; continue PPI for now      Relevant Orders   Ambulatory referral to Gastroenterology     Musculoskeletal and Integument   Degenerative arthritis of knee, bilateral - Primary    Will try topicals and temperature (ice) and tylenol; avoid NSAIDs; get xrays; weight loss  encouraged; I will not prescribe narcotics with  her already taking benzodiazepine, zolpidem, and drinking alcohol      Relevant Orders   DG Knee Complete 4 Views Left   DG Knee Complete 4 Views Right     Other   Obesity    Encouraged pt to work on weight loss       Other Visit Diagnoses   None.     Follow up plan: Return in about 4 weeks (around 01/24/2016) for complete physical.  An after-visit summary was printed and given to the patient at [place].  Please see the patient instructions which may contain other information and recommendations beyond what is mentioned above in the assessment and plan.  Orders Placed This Encounter  Procedures  . DG Knee Complete 4 Views Left  . DG Knee Complete 4 Views Right  . Ambulatory referral to Gastroenterology   Patient declined flu vaccine today

## 2016-01-09 ENCOUNTER — Other Ambulatory Visit: Payer: Self-pay | Admitting: Family Medicine

## 2016-01-09 DIAGNOSIS — Z1239 Encounter for other screening for malignant neoplasm of breast: Secondary | ICD-10-CM

## 2016-01-09 NOTE — Progress Notes (Signed)
LMOM to inform pt to call and schedule an appt °

## 2016-01-09 NOTE — Progress Notes (Signed)
Note from insurance company; pt due for mammo Please let her know I ordered the mammogram and she can call to schedule At last visit, I encouraged pt to return 4 weeks after last visit She has apparently already had her Medicare Wellness visit, so just book for a regular f/u visit (cholesterol, HTN, etc.) visit, and ask her to come fasting; thank you

## 2016-01-30 ENCOUNTER — Other Ambulatory Visit: Payer: Self-pay

## 2016-01-30 DIAGNOSIS — I10 Essential (primary) hypertension: Secondary | ICD-10-CM

## 2016-01-30 DIAGNOSIS — I25119 Atherosclerotic heart disease of native coronary artery with unspecified angina pectoris: Secondary | ICD-10-CM

## 2016-01-30 MED ORDER — METOPROLOL TARTRATE 50 MG PO TABS: 50.0000 mg | ORAL_TABLET | Freq: Two times a day (BID) | ORAL | 0 refills | Status: DC

## 2016-01-30 NOTE — Telephone Encounter (Signed)
I saw patient in September and she was supposed to return 4 weeks later I don't see any future appointments Please ask her to schedule an appointment as soon as possible

## 2016-01-31 NOTE — Telephone Encounter (Signed)
Left detailed voicemail

## 2016-03-12 ENCOUNTER — Other Ambulatory Visit: Payer: Self-pay | Admitting: Family Medicine

## 2016-03-12 DIAGNOSIS — I10 Essential (primary) hypertension: Secondary | ICD-10-CM

## 2016-03-12 DIAGNOSIS — I25119 Atherosclerotic heart disease of native coronary artery with unspecified angina pectoris: Secondary | ICD-10-CM

## 2016-03-12 NOTE — Telephone Encounter (Signed)
Patient was due for a physical in October Please contact her and ask her to schedule; thank you I sent refill as requested

## 2016-03-12 NOTE — Telephone Encounter (Signed)
Tried calling pt and her phone states she is unavailable at this time

## 2016-03-13 NOTE — Telephone Encounter (Signed)
Pt unavailable, phone is off.

## 2016-03-23 ENCOUNTER — Emergency Department: Payer: Medicare Other

## 2016-03-23 ENCOUNTER — Encounter: Payer: Self-pay | Admitting: Emergency Medicine

## 2016-03-23 ENCOUNTER — Emergency Department
Admission: EM | Admit: 2016-03-23 | Discharge: 2016-03-23 | Disposition: A | Discharge status: Home / Self Care | Payer: Medicare Other | Attending: Emergency Medicine | Admitting: Emergency Medicine

## 2016-03-23 DIAGNOSIS — I10 Essential (primary) hypertension: Secondary | ICD-10-CM | POA: Insufficient documentation

## 2016-03-23 DIAGNOSIS — F909 Attention-deficit hyperactivity disorder, unspecified type: Secondary | ICD-10-CM | POA: Insufficient documentation

## 2016-03-23 DIAGNOSIS — G8929 Other chronic pain: Secondary | ICD-10-CM | POA: Insufficient documentation

## 2016-03-23 DIAGNOSIS — Z87891 Personal history of nicotine dependence: Secondary | ICD-10-CM | POA: Insufficient documentation

## 2016-03-23 DIAGNOSIS — M25562 Pain in left knee: Secondary | ICD-10-CM | POA: Diagnosis not present

## 2016-03-23 DIAGNOSIS — M25561 Pain in right knee: Secondary | ICD-10-CM | POA: Insufficient documentation

## 2016-03-23 DIAGNOSIS — Z79899 Other long term (current) drug therapy: Secondary | ICD-10-CM | POA: Insufficient documentation

## 2016-03-23 MED ORDER — TRAMADOL HCL 50 MG PO TABS
50.0000 mg | ORAL_TABLET | Freq: Once | ORAL | Status: AC
  Administered 2016-03-23: 50 mg via ORAL
  Filled 2016-03-23: qty 1

## 2016-03-23 MED ORDER — TRAMADOL HCL 50 MG PO TABS: 50.0000 mg | ORAL_TABLET | Freq: Four times a day (QID) | ORAL | 0 refills | Status: DC | PRN

## 2016-03-23 NOTE — Discharge Instructions (Signed)
Ibuprofen as bad for you given your history of bleeding from your stomach. However, a narcotic-containing medication is also risky for you or anyone else because of the potential for addiction. They're not ideal for long-term pain. However, we will give you a small prescription to get through the next few days until hopefully you can see orthopedic surgery. Return to the emergency room for any new or worrisome symptoms.

## 2016-03-23 NOTE — ED Triage Notes (Signed)
C/O bilateral knee swelling and pain x 1 month, denies injury.

## 2016-03-23 NOTE — ED Provider Notes (Addendum)
[facility] Emergency Department Provider Note  ____________________________________________   I have reviewed the triage vital signs and the nursing notes.   HISTORY  Chief Complaint Knee Pain    HPI Linda Mejia is a 60 y.o. female presents today with knee pain for 5 months. His been going on since January or August. She states that sometimes is one knee and sometimes a [place] knee. Is worse when she walks. She's tried Tylenol and ibuprofen with minimal relief. Sometimes it seemed to swell up. She states she has not yet had a chance to talk to her primary care doctor and intact because she and her primary care doctor do not "click", the patient states that she has started looking for a new doctor and in fact has secured one.  She has had no fever no chills no history of gout no redness no trauma no injury. Today to the right knee which is worse.      Past Medical History:  Diagnosis Date  . ADHD (attention deficit hyperactivity disorder)   . Anxiety   . Chronic back pain   . Coronary artery disease   . Herpes genitalis in women   . History of stomach ulcers    x7 this year. Bleeding  . Hypertension     Patient Active Problem List   Diagnosis Date Noted  . Degenerative arthritis of knee, bilateral 12/27/2015  . Obesity 12/27/2015  . GI bleed 09/21/2015  . Reflux esophagitis   . Other diseases of stomach and duodenum   . Duodenitis   . Subclinical hypothyroidism 06/06/2015  . Multiple joint pain 06/05/2015  . Other fatigue 06/05/2015  . Night sweats 06/05/2015  . Screening for tuberculosis 06/05/2015  . Headache 04/12/2015  . Subclinical hypothyroidism 03/26/2015  . Herpes simplex type 2 infection 03/21/2015  . Rhinitis, allergic 03/21/2015  . Cervical pain 09/21/2014  . ADD (attention deficit hyperactivity disorder, inattentive type) 09/20/2014  . Episodic paroxysmal anxiety disorder 09/20/2014  . History of gastric ulcer 09/20/2014  . Abuse,  drug or alcohol 09/20/2014  . Breathlessness on exertion 08/28/2014  . HLD (hyperlipidemia) 10/03/2009  . Coronary artery disease involving native coronary artery of native heart with angina pectoris ([place]) 10/02/2009  . Hypertension goal BP (blood pressure) < 140/90 10/02/2009    Past Surgical History:  Procedure Laterality Date  . CARDIAC CATHETERIZATION    . CESAREAN SECTION    . ESOPHAGOGASTRODUODENOSCOPY (EGD) WITH PROPOFOL N/A 09/21/2015   Procedure: ESOPHAGOGASTRODUODENOSCOPY (EGD) WITH PROPOFOL;  Surgeon: Lucilla Lame, MD;  Location: [facility] ENDOSCOPY;  Service: Endoscopy;  Laterality: N/A;  . TONSILLECTOMY    . TUBAL LIGATION      Prior to Admission medications   Medication Sig Start Date End Date Taking? Authorizing Provider  acyclovir (ZOVIRAX) 400 MG tablet Take 1 tablet by mouth 2 (two) times daily as needed. Reported on 06/05/2015 03/20/15   Historical Provider, MD  amphetamine-dextroamphetamine (ADDERALL) 30 MG tablet Take 30 mg by mouth 2 (two) times daily.    Historical Provider, MD  aspirin EC 81 MG tablet Take 81 mg by mouth daily.    Historical Provider, MD  diazepam (VALIUM) 10 MG tablet Take 1 tablet (10 mg total) by mouth every 8 (eight) hours as needed for anxiety. 09/22/15   Fritzi Mandes, MD  fluticasone (FLONASE) 50 MCG/ACT nasal spray Place 2 sprays into both nostrils daily. 03/21/15   Bobetta Lime, MD  metoprolol (LOPRESSOR) 50 MG tablet TAKE 1 TABLET BY MOUTH TWICE A DAY  03/12/16   Arnetha Courser, MD  omeprazole (PRILOSEC) 20 MG capsule Take 2 capsules (40 mg total) by mouth 2 (two) times daily before a meal. 09/22/15   Fritzi Mandes, MD  rosuvastatin (CRESTOR) 20 MG tablet Take 1 tablet (20 mg total) by mouth daily. 03/26/15   Bobetta Lime, MD  zolpidem (AMBIEN) 10 MG tablet Take 10 mg by mouth at bedtime as needed.      Historical Provider, MD    Allergies Patient has no known allergies.  Family History  Problem Relation Age of Onset  . Diabetes Mother   .  Heart disease Father     Social History Social History  Substance Use Topics  . Smoking status: Former Smoker    Packs/day: 1.00    Years: 30.00  . Smokeless tobacco: Never Used  . Alcohol use Yes     Comment: occasionally    Review of Systems Constitutional: No fever/chills Eyes: No visual changes. ENT: No sore throat. No stiff neck no neck pain Cardiovascular: Denies chest pain. Respiratory: Denies shortness of breath. Gastrointestinal:   no vomiting.  No diarrhea.  No constipation. Genitourinary: Negative for dysuria. Musculoskeletal: Negative lower extremity swelling Skin: Negative for rash. Neurological: Negative for severe headaches, focal weakness or numbness. 10-point ROS otherwise negative.  ____________________________________________   PHYSICAL EXAM:  VITAL SIGNS: ED Triage Vitals  Enc Vitals Group     BP 03/23/16 1636 (!) 146/74     Pulse Rate 03/23/16 1636 81     Resp 03/23/16 1636 (!) 81     Temp 03/23/16 1636 97.4 F (36.3 C)     Temp Source 03/23/16 1636 Oral     SpO2 03/23/16 1636 94 %     Weight 03/23/16 1635 198 lb (89.8 kg)     Height 03/23/16 1635 5\' 6"  (1.676 m)     Head Circumference --      Peak Flow --      Pain Score 03/23/16 1635 4     Pain Loc --      Pain Edu? --      Excl. in [place]? --     Constitutional: Alert and oriented. Well appearing and in no acute distress. Eyes: Conjunctivae are normal. PERRL. EOMI. Head: Atraumatic. Nose: No congestion/rhinnorhea. Mouth/Throat: Mucous membranes are moist.  Oropharynx non-erythematous. Neck: No stridor.   Nontender with no meningismus Cardiovascular: Normal rate, regular rhythm. Grossly normal heart sounds.  Good peripheral circulation. Respiratory: Normal respiratory effort.  No retractions. Lungs CTAB. Abdominal: Soft and nontender. No distention. No guarding no rebound Back:  There is no focal tenderness or step off.  there is no midline tenderness there are no lesions noted. there is  no CVA tenderness Musculoskeletal: There is slight effusion to the right knee, it is not red or hot to touch, left knee also has minimal tenderness to palpation. She has full range of motion in both knees. They are not erythematous they are not warm. There is no definite crepitus noted., no upper extremity tenderness. No joint effusions, no DVT signs strong distal pulses no edema Neurologic:  Normal speech and language. No gross focal neurologic deficits are appreciated.  Skin:  Skin is warm, dry and intact. No rash noted. Psychiatric: Mood and affect are normal. Speech and behavior are normal.  ____________________________________________   LABS (all labs ordered are listed, but only abnormal results are displayed)  Labs Reviewed - No data to display ____________________________________________  EKG  I personally interpreted any EKGs ordered by  me or triage  ____________________________________________  RADIOLOGY  I reviewed any imaging ordered by me or triage that were performed during my shift and, if possible, patient and/or family made aware of any abnormal findings. ____________________________________________   PROCEDURES  Procedure(s) performed: None  Procedures  Critical Care performed: None  ____________________________________________   INITIAL IMPRESSION / ASSESSMENT AND PLAN / ED COURSE  Pertinent labs & imaging results that were available during my care of the patient were reviewed by me and considered in my medical decision making (see chart for details).  Patient with bilateral reproducible knee pain with no evidence of infection, this is of long-standing, patient does suffer from some degree of obesity. His wife and this is most like arthritis given the chronicity of the complaint and the way the symptoms unfold. I do not think it likely represents gout, DVT or traumatic injury. As she has a small effusion on that right knee, x-ray of the right knee. I will  give her a short course of stronger pain medication I have advised her that these can be addictive, and that she must follow-up with orthopedics possible steroid shot or further evaluation of likely arthritic pathology. Patient very much in understanding of this plan..    ----------------------------------------- 6:06 PM on 03/23/2016 -----------------------------------------  Patient has had an extensive visit with a primary care doctor for this in September. They did encourage weight loss. Is unclear why she did not "click" with his doctor. I'm reluctant to start her narcotic pain medication however with a history of bleeding ulcers and also reluctant to send her home with a risk of taking more Motrin/ibuprofen. She states she mostly takes Tylenol. We will give her a few days as she states that she is unable to get around the house as it is now during this flare, but I have stressed the need to follow-up with her for possible steroid shots or other interventions and we have discussed weight loss ambulatory with a normal gait on discharge.   Clinical Course    ____________________________________________   FINAL CLINICAL IMPRESSION(S) / ED DIAGNOSES  Final diagnoses:  None      This chart was dictated using voice recognition software.  Despite best efforts to proofread,  errors can occur which can change meaning.      Schuyler Amor, MD 03/23/16 Susan Moore, MD 03/23/16 [place], MD 03/23/16 [phone]

## 2016-04-11 DIAGNOSIS — M170 Bilateral primary osteoarthritis of knee: Secondary | ICD-10-CM | POA: Diagnosis not present

## 2016-05-02 NOTE — Progress Notes (Signed)
Pt understands to call and schedule mammo and she will call back to schedule an appt. Pt also would like refill on Metopolol to be sent to [facility].

## 2016-05-06 ENCOUNTER — Other Ambulatory Visit: Payer: Self-pay | Admitting: Family Medicine

## 2016-05-06 DIAGNOSIS — I10 Essential (primary) hypertension: Secondary | ICD-10-CM

## 2016-05-06 DIAGNOSIS — I25119 Atherosclerotic heart disease of native coronary artery with unspecified angina pectoris: Secondary | ICD-10-CM

## 2016-05-06 NOTE — Telephone Encounter (Signed)
Pt called and states she does need to get with her daughter before she can schedule an appt.

## 2016-05-06 NOTE — Telephone Encounter (Signed)
Last visit reviewed Patient was asked to return after 4 weeks for complete physical Please ask her to schedule; thank you

## 2016-05-19 ENCOUNTER — Other Ambulatory Visit: Payer: Self-pay | Admitting: Family Medicine

## 2016-05-19 NOTE — Telephone Encounter (Signed)
Pt has an appt 06/09/16; Rx approved

## 2016-06-09 ENCOUNTER — Ambulatory Visit (INDEPENDENT_AMBULATORY_CARE_PROVIDER_SITE_OTHER): Payer: Medicare Other | Admitting: Family Medicine

## 2016-06-09 ENCOUNTER — Encounter: Payer: Self-pay | Admitting: Family Medicine

## 2016-06-09 VITALS — BP 132/82 | HR 73 | Temp 98.1°F | Resp 16 | Ht 64.4 in | Wt 197.6 lb

## 2016-06-09 DIAGNOSIS — Z1231 Encounter for screening mammogram for malignant neoplasm of breast: Secondary | ICD-10-CM | POA: Diagnosis not present

## 2016-06-09 DIAGNOSIS — I25119 Atherosclerotic heart disease of native coronary artery with unspecified angina pectoris: Secondary | ICD-10-CM

## 2016-06-09 DIAGNOSIS — Z1239 Encounter for other screening for malignant neoplasm of breast: Secondary | ICD-10-CM

## 2016-06-09 DIAGNOSIS — Z0000 Encounter for general adult medical examination without abnormal findings: Secondary | ICD-10-CM | POA: Insufficient documentation

## 2016-06-09 DIAGNOSIS — Z87891 Personal history of nicotine dependence: Secondary | ICD-10-CM | POA: Insufficient documentation

## 2016-06-09 DIAGNOSIS — Z113 Encounter for screening for infections with a predominantly sexual mode of transmission: Secondary | ICD-10-CM | POA: Diagnosis not present

## 2016-06-09 DIAGNOSIS — I10 Essential (primary) hypertension: Secondary | ICD-10-CM

## 2016-06-09 DIAGNOSIS — Z1211 Encounter for screening for malignant neoplasm of colon: Secondary | ICD-10-CM | POA: Diagnosis not present

## 2016-06-09 DIAGNOSIS — Z114 Encounter for screening for human immunodeficiency virus [HIV]: Secondary | ICD-10-CM

## 2016-06-09 DIAGNOSIS — Z23 Encounter for immunization: Secondary | ICD-10-CM

## 2016-06-09 DIAGNOSIS — W548XXA Other contact with dog, initial encounter: Secondary | ICD-10-CM | POA: Diagnosis not present

## 2016-06-09 DIAGNOSIS — E785 Hyperlipidemia, unspecified: Secondary | ICD-10-CM | POA: Diagnosis not present

## 2016-06-09 DIAGNOSIS — R5383 Other fatigue: Secondary | ICD-10-CM | POA: Diagnosis not present

## 2016-06-09 LAB — COMPLETE METABOLIC PANEL WITH GFR
ALT: 17 U/L (ref 6–29)
AST: 24 U/L (ref 10–35)
Albumin: 4.3 g/dL (ref 3.6–5.1)
Alkaline Phosphatase: 76 U/L (ref 33–130)
BUN: 12 mg/dL (ref 7–25)
CO2: 25 mmol/L (ref 20–31)
Calcium: 9.2 mg/dL (ref 8.6–10.4)
Chloride: 102 mmol/L (ref 98–110)
Creat: 0.84 mg/dL (ref 0.50–1.05)
GFR, Est African American: 88 mL/min (ref >=60)
GFR, Est Non African American: 76 mL/min (ref >=60)
Glucose, Bld: 92 mg/dL (ref 65–99)
Potassium: 4.4 mmol/L (ref 3.5–5.3)
Sodium: 140 mmol/L (ref 135–146)
Total Bilirubin: 0.5 mg/dL (ref 0.2–1.2)
Total Protein: 7.2 g/dL (ref 6.1–8.1)

## 2016-06-09 LAB — CBC WITH DIFFERENTIAL/PLATELET
Basophils Absolute: 52 cells/uL (ref 0–200)
Basophils Relative: 1 %
Eosinophils Absolute: 156 cells/uL (ref 15–500)
Eosinophils Relative: 3 %
HCT: 39.4 % (ref 35.0–45.0)
Hemoglobin: 12.8 g/dL (ref 11.7–15.5)
Lymphocytes Relative: 46 %
Lymphs Abs: 2392 cells/uL (ref 850–3900)
MCH: 30.6 pg (ref 27.0–33.0)
MCHC: 32.5 g/dL (ref 32.0–36.0)
MCV: 94.3 fL (ref 80.0–100.0)
MPV: 8.6 fL (ref 7.5–12.5)
Monocytes Absolute: 624 cells/uL (ref 200–950)
Monocytes Relative: 12 %
Neutro Abs: 1976 cells/uL (ref 1500–7800)
Neutrophils Relative %: 38 %
Platelets: 376 10*3/uL (ref 140–400)
RBC: 4.18 MIL/uL (ref 3.80–5.10)
RDW: 13.6 % (ref 11.0–15.0)
WBC: 5.2 10*3/uL (ref 3.8–10.8)

## 2016-06-09 LAB — LIPID PANEL
Cholesterol: 280 mg/dL — ABNORMAL HIGH (ref <200)
HDL: 59 mg/dL (ref >50)
LDL Cholesterol: 194 mg/dL — ABNORMAL HIGH (ref <100)
Total CHOL/HDL Ratio: 4.7 Ratio (ref <5.0)
Triglycerides: 135 mg/dL (ref <150)
VLDL: 27 mg/dL (ref <30)

## 2016-06-09 MED ORDER — METOPROLOL TARTRATE 50 MG PO TABS: 50.0000 mg | ORAL_TABLET | Freq: Two times a day (BID) | ORAL | 11 refills | Status: DC

## 2016-06-09 MED ORDER — FLUTICASONE PROPIONATE 50 MCG/ACT NA SUSP: 2.0000 | Freq: Every day | NASAL | 11 refills | Status: DC

## 2016-06-09 NOTE — Patient Instructions (Addendum)
See if Shingrix is covered I'll recommend 1,000 iu of vitamin D3 once a day Please feel free to schedule an appointment to discuss your stomach or any symptoms that bother you  Health Maintenance, Female Introduction Adopting a healthy lifestyle and getting preventive care can go a long way to promote health and wellness. Talk with your health care provider about what schedule of regular examinations is right for you. This is a good chance for you to check in with your provider about disease prevention and staying healthy. In between checkups, there are plenty of things you can do on your own. Experts have done a lot of research about which lifestyle changes and preventive measures are most likely to keep you healthy. Ask your health care provider for more information. Weight and diet Eat a healthy diet  Be sure to include plenty of vegetables, fruits, low-fat dairy products, and lean protein.  Do not eat a lot of foods high in solid fats, added sugars, or salt.  Get regular exercise. This is one of the most important things you can do for your health.  Most adults should exercise for at least 150 minutes each week. The exercise should increase your heart rate and make you sweat (moderate-intensity exercise).  Most adults should also do strengthening exercises at least twice a week. This is in addition to the moderate-intensity exercise. Maintain a healthy weight  Body mass index (BMI) is a measurement that can be used to identify possible weight problems. It estimates body fat based on height and weight. Your health care provider can help determine your BMI and help you achieve or maintain a healthy weight.  For females 69 years of age and older:  A BMI below 18.5 is considered underweight.  A BMI of 18.5 to 24.9 is normal.  A BMI of 25 to 29.9 is considered overweight.  A BMI of 30 and above is considered obese. Watch levels of cholesterol and blood lipids  You should start  having your blood tested for lipids and cholesterol at 60 years of age, then have this test every 5 years.  You may need to have your cholesterol levels checked more often if:  Your lipid or cholesterol levels are high.  You are older than 60 years of age.  You are at high risk for heart disease. Cancer screening Lung Cancer  Lung cancer screening is recommended for adults 42-12 years old who are at high risk for lung cancer because of a history of smoking.  A yearly low-dose CT scan of the lungs is recommended for people who:  Currently smoke.  Have quit within the past 15 years.  Have at least a 30-pack-year history of smoking. A pack year is smoking an average of one pack of cigarettes a day for 1 year.  Yearly screening should continue until it has been 15 years since you quit.  Yearly screening should stop if you develop a health problem that would prevent you from having lung cancer treatment. Breast Cancer  Practice breast self-awareness. This means understanding how your breasts normally appear and feel.  It also means doing regular breast self-exams. Let your health care provider know about any changes, no matter how small.  If you are in your 20s or 30s, you should have a clinical breast exam (CBE) by a health care provider every 1-3 years as part of a regular health exam.  If you are 49 or older, have a CBE every year. Also consider having a breast  X-ray (mammogram) every year.  If you have a family history of breast cancer, talk to your health care provider about genetic screening.  If you are at high risk for breast cancer, talk to your health care provider about having an MRI and a mammogram every year.  Breast cancer gene (BRCA) assessment is recommended for women who have family members with BRCA-related cancers. BRCA-related cancers include:  Breast.  Ovarian.  Tubal.  Peritoneal cancers.  Results of the assessment will determine the need for genetic  counseling and BRCA1 and BRCA2 testing. Cervical Cancer  Your health care provider may recommend that you be screened regularly for cancer of the pelvic organs (ovaries, uterus, and vagina). This screening involves a pelvic examination, including checking for microscopic changes to the surface of your cervix (Pap test). You may be encouraged to have this screening done every 3 years, beginning at age 73.  For women ages 50-65, health care providers may recommend pelvic exams and Pap testing every 3 years, or they may recommend the Pap and pelvic exam, combined with testing for human papilloma virus (HPV), every 5 years. Some types of HPV increase your risk of cervical cancer. Testing for HPV may also be done on women of any age with unclear Pap test results.  Other health care providers may not recommend any screening for nonpregnant women who are considered low risk for pelvic cancer and who do not have symptoms. Ask your health care provider if a screening pelvic exam is right for you.  If you have had past treatment for cervical cancer or a condition that could lead to cancer, you need Pap tests and screening for cancer for at least 20 years after your treatment. If Pap tests have been discontinued, your risk factors (such as having a new sexual partner) need to be reassessed to determine if screening should resume. Some women have medical problems that increase the chance of getting cervical cancer. In these cases, your health care provider may recommend more frequent screening and Pap tests. Colorectal Cancer  This type of cancer can be detected and often prevented.  Routine colorectal cancer screening usually begins at 60 years of age and continues through 60 years of age.  Your health care provider may recommend screening at an earlier age if you have risk factors for colon cancer.  Your health care provider may also recommend using home test kits to check for hidden blood in the stool.  A  small camera at the end of a tube can be used to examine your colon directly (sigmoidoscopy or colonoscopy). This is done to check for the earliest forms of colorectal cancer.  Routine screening usually begins at age 92.  Direct examination of the colon should be repeated every 5-10 years through 60 years of age. However, you may need to be screened more often if early forms of precancerous polyps or small growths are found. Skin Cancer  Check your skin from head to toe regularly.  Tell your health care provider about any new moles or changes in moles, especially if there is a change in a mole's shape or color.  Also tell your health care provider if you have a mole that is larger than the size of a pencil eraser.  Always use sunscreen. Apply sunscreen liberally and repeatedly throughout the day.  Protect yourself by wearing long sleeves, pants, a wide-brimmed hat, and sunglasses whenever you are outside. Heart disease, diabetes, and high blood pressure  High blood pressure causes heart  disease and increases the risk of stroke. High blood pressure is more likely to develop in:  People who have blood pressure in the high end of the normal range (130-139/85-89 mm Hg).  People who are overweight or obese.  People who are African American.  If you are 58-41 years of age, have your blood pressure checked every 3-5 years. If you are 65 years of age or older, have your blood pressure checked every year. You should have your blood pressure measured twice-once when you are at a hospital or clinic, and once when you are not at a hospital or clinic. Record the average of the two measurements. To check your blood pressure when you are not at a hospital or clinic, you can use:  An automated blood pressure machine at a pharmacy.  A home blood pressure monitor.  If you are between 7 years and 32 years old, ask your health care provider if you should take aspirin to prevent strokes.  Have regular  diabetes screenings. This involves taking a blood sample to check your fasting blood sugar level.  If you are at a normal weight and have a low risk for diabetes, have this test once every three years after 60 years of age.  If you are overweight and have a high risk for diabetes, consider being tested at a younger age or more often. Preventing infection Hepatitis B  If you have a higher risk for hepatitis B, you should be screened for this virus. You are considered at high risk for hepatitis B if:  You were born in a country where hepatitis B is common. Ask your health care provider which countries are considered high risk.  Your parents were born in a high-risk country, and you have not been immunized against hepatitis B (hepatitis B vaccine).  You have HIV or AIDS.  You use needles to inject street drugs.  You live with someone who has hepatitis B.  You have had sex with someone who has hepatitis B.  You get hemodialysis treatment.  You take certain medicines for conditions, including cancer, organ transplantation, and autoimmune conditions. Hepatitis C  Blood testing is recommended for:  Everyone born from [birth year] through 1964.  Anyone with known risk factors for hepatitis C. Sexually transmitted infections (STIs)  You should be screened for sexually transmitted infections (STIs) including gonorrhea and chlamydia if:  You are sexually active and are younger than 60 years of age.  You are older than 60 years of age and your health care provider tells you that you are at risk for this type of infection.  Your sexual activity has changed since you were last screened and you are at an increased risk for chlamydia or gonorrhea. Ask your health care provider if you are at risk.  If you do not have HIV, but are at risk, it may be recommended that you take a prescription medicine daily to prevent HIV infection. This is called pre-exposure prophylaxis (PrEP). You are considered at  risk if:  You are sexually active and do not regularly use condoms or know the HIV status of your partner(s).  You take drugs by injection.  You are sexually active with a partner who has HIV. Talk with your health care provider about whether you are at high risk of being infected with HIV. If you choose to begin PrEP, you should first be tested for HIV. You should then be tested every 3 months for as long as you are taking PrEP.  Pregnancy  If you are premenopausal and you may become pregnant, ask your health care provider about preconception counseling.  If you may become pregnant, take 400 to 800 micrograms (mcg) of folic acid every day.  If you want to prevent pregnancy, talk to your health care provider about birth control (contraception). Osteoporosis and menopause  Osteoporosis is a disease in which the bones lose minerals and strength with aging. This can result in serious bone fractures. Your risk for osteoporosis can be identified using a bone density scan.  If you are 51 years of age or older, or if you are at risk for osteoporosis and fractures, ask your health care provider if you should be screened.  Ask your health care provider whether you should take a calcium or vitamin D supplement to lower your risk for osteoporosis.  Menopause may have certain physical symptoms and risks.  Hormone replacement therapy may reduce some of these symptoms and risks. Talk to your health care provider about whether hormone replacement therapy is right for you. Follow these instructions at home:  Schedule regular health, dental, and eye exams.  Stay current with your immunizations.  Do not use any tobacco products including cigarettes, chewing tobacco, or electronic cigarettes.  If you are pregnant, do not drink alcohol.  If you are breastfeeding, limit how much and how often you drink alcohol.  Limit alcohol intake to no more than 1 drink per day for nonpregnant women. One drink  equals 12 ounces of beer, 5 ounces of wine, or 1 ounces of hard liquor.  Do not use street drugs.  Do not share needles.  Ask your health care provider for help if you need support or information about quitting drugs.  Tell your health care provider if you often feel depressed.  Tell your health care provider if you have ever been abused or do not feel safe at home. This information is not intended to replace advice given to you by your health care provider. Make sure you discuss any questions you have with your health care provider. Document Released: 10/14/2010 Document Revised: 09/06/2015 Document Reviewed: 01/02/2015  2017 Elsevier

## 2016-06-09 NOTE — Assessment & Plan Note (Signed)
USPSTF grade A and B recommendations reviewed with patient; age-appropriate recommendations, preventive care, screening tests, etc discussed and encouraged; healthy living encouraged; see AVS for patient education given to patient  

## 2016-06-09 NOTE — Assessment & Plan Note (Signed)
Order a chest CT, yearly until 15 years from quit date

## 2016-06-09 NOTE — Assessment & Plan Note (Signed)
Check HIV 

## 2016-06-09 NOTE — Assessment & Plan Note (Signed)
Check labs 

## 2016-06-09 NOTE — Progress Notes (Signed)
Patient ID: MYRTLE HALLER, female   DOB: [birth year], 61 y.o.   MRN: [id number]   Subjective:   Linda Mejia is a 61 y.o. female here for a complete physical exam  Interim issues since last visit: none  USPSTF grade A and B recommendations Alcohol: occasional Depression:  Depression screen [facility] 2/9 06/09/2016 12/27/2015 03/21/2015  Decreased Interest 0 0 3  Down, Depressed, Hopeless 0 3 3  PHQ - 2 Score 0 3 6  Altered sleeping - 1 3  Tired, decreased energy - 3 3  Change in appetite - 1 1  Feeling bad or failure about yourself  - 2 3  Trouble concentrating - 1 1  Moving slowly or fidgety/restless - 1 0  Suicidal thoughts - 1 0  PHQ-9 Score - 13 17  Difficult doing work/chores - Very difficult Very difficult   Hypertension: controlled Obesity: will get weight, losing weight Tobacco use:  HIV, hep B, hep C: yes, hiv and hep C already done in 2016; will recheck hiv since new sexual partner Lipids: check today; last thing to drink was diet ginger ale 3-1/2 hours before Glucose: check today, fasting Colorectal cancer: no fam hx of colon cancer; no blood in the stool; colonoscopy done she thinks but can't remember; had EGD 09/21/15; not sure when, maybe 5+ years ago; at [facility] Breast cancer: last mammogram 5+ years ago BRCA gene screening: no fam hx of breast or ovarian cancer Intimate partner violence: no abuse Cervical cancer screening: due Dec 2019; no abnormals Lung cancer: quit 12 years ago; smoked 30 years Osteoporosis: not many steroids Fall prevention/vitamin D: discussed AAA: n/a Aspirin: taking Vaccine: shingles will check if covered Diet: starting to eat more greens, not quite 5 Exercise: starting to exercise, on daughter's treadmill; knees are less limiting (shots in the knees), not steroids Skin cancer: SKs on the stomach  Past Medical History:  Diagnosis Date  . ADHD (attention deficit hyperactivity disorder)   . Anxiety   . Chronic back pain   . Coronary artery disease    . Herpes genitalis in women   . History of stomach ulcers    x7 this year. Bleeding  . Hypertension    Past Surgical History:  Procedure Laterality Date  . CARDIAC CATHETERIZATION    . CESAREAN SECTION    . ESOPHAGOGASTRODUODENOSCOPY (EGD) WITH PROPOFOL N/A 09/21/2015   Procedure: ESOPHAGOGASTRODUODENOSCOPY (EGD) WITH PROPOFOL;  Surgeon: Lucilla Lame, MD;  Location: [facility] ENDOSCOPY;  Service: Endoscopy;  Laterality: N/A;  . TONSILLECTOMY    . TUBAL LIGATION     Family History  Problem Relation Age of Onset  . Diabetes Mother   . Heart disease Father   . Heart disease Sister   . Heart disease Brother   . Heart disease Sister   . Heart disease Sister   . Heart disease Sister   . Heart disease Brother   . Heart disease Brother   . Heart disease Brother    Social History  Substance Use Topics  . Smoking status: Former Smoker    Packs/day: 1.00    Years: 30.00  . Smokeless tobacco: Never Used  . Alcohol use Yes     Comment: occasionally   Review of Systems  Cardiovascular: Negative for chest pain.    Objective:   Vitals:   06/09/16 1108  BP: 132/82  Pulse: 73  Resp: 16  Temp: 98.1 F (36.7 C)  TempSrc: Oral  SpO2: 93%  Weight: 197 lb 9 oz (89.6 kg)  Height: 5' 4.4" (1.636 m)   Body mass index is 33.49 kg/m. Wt Readings from Last 3 Encounters:  06/09/16 197 lb 9 oz (89.6 kg)  03/23/16 198 lb (89.8 kg)  12/27/15 199 lb (90.3 kg)   Physical Exam  Constitutional: She appears well-developed and well-nourished.  HENT:  Head: Normocephalic and atraumatic.  Right Ear: Hearing, tympanic membrane, external ear and ear canal normal.  Left Ear: Hearing, tympanic membrane, external ear and ear canal normal.  Eyes: Conjunctivae and EOM are normal. Right eye exhibits no hordeolum. Left eye exhibits no hordeolum. No scleral icterus.  Neck: Carotid bruit is not present. No thyromegaly present.  Cardiovascular: Normal rate, regular rhythm, S1 normal, S2 normal and normal  heart sounds.   No extrasystoles are present.  Pulmonary/Chest: Effort normal and breath sounds normal. No respiratory distress. Right breast exhibits no inverted nipple, no mass, no nipple discharge, no skin change and no tenderness. Left breast exhibits no inverted nipple, no mass, no nipple discharge, no skin change and no tenderness. Breasts are symmetrical.  Abdominal: Soft. Normal appearance and bowel sounds are normal. She exhibits no distension, no abdominal bruit, no pulsatile midline mass and no mass. There is no hepatosplenomegaly. There is no tenderness. No hernia.  Musculoskeletal: Normal range of motion. She exhibits no edema.  Lymphadenopathy:       Head (right side): No submandibular adenopathy present.       Head (left side): No submandibular adenopathy present.    She has no cervical adenopathy.    She has no axillary adenopathy.  Neurological: She is alert. She displays no tremor. No cranial nerve deficit. She exhibits normal muscle tone. Gait normal.  Reflex Scores:      Patellar reflexes are 2+ on the right side and 2+ on the left side. Skin: Skin is warm and dry. No cyanosis. No pallor.  Scratch on the left forearm; surrounding bruising; no drainage; no proximal red streaks  Psychiatric: Her speech is normal and behavior is normal. Thought content normal. Her mood appears not anxious. She does not exhibit a depressed mood.    Assessment/Plan:   Problem List Items Addressed This Visit      Cardiovascular and Mediastinum   Hypertension goal BP (blood pressure) < 140/90   Relevant Medications   metoprolol (LOPRESSOR) 50 MG tablet   Coronary artery disease involving native coronary artery of native heart with angina pectoris (HCC)   Relevant Medications   metoprolol (LOPRESSOR) 50 MG tablet     Other   Screen for STD (sexually transmitted disease)    Check labs      Relevant Orders   Hepatitis C Antibody (Completed)   Preventative health care - Primary    USPSTF  grade A and B recommendations reviewed with patient; age-appropriate recommendations, preventive care, screening tests, etc discussed and encouraged; healthy living encouraged; see AVS for patient education given to patient       Relevant Orders   Lipid panel (Completed)   CBC with Differential/Platelet (Completed)   COMPLETE METABOLIC PANEL WITH GFR (Completed)   Hx of tobacco use, presenting hazards to health    Order a chest CT, yearly until 15 years from quit date      Relevant Orders   CT CHEST LUNG CA SCREEN LOW DOSE W/O CM   Encounter for HIV (human immunodeficiency virus) test    Check HIV      Relevant Orders   HIV antibody (Completed)    Other Visit Diagnoses  Breast cancer screening       Relevant Orders   MM Digital Screening   Colon cancer screening       Relevant Orders   Ambulatory referral to Gastroenterology   Needs flu shot       Relevant Orders   Flu Vaccine QUAD 36+ mos PF IM (Fluarix & Fluzone Quad PF) (Completed)   Dog scratch       does not appear infected; tetanus booster offered, given today   Relevant Orders   Tdap vaccine greater than or equal to 7yo IM (Completed)      Last minute, patient said she was having some stomach issues; will get colonoscopy; may return for visit if bothersome Meds ordered this encounter  Medications  . metoprolol (LOPRESSOR) 50 MG tablet    Sig: Take 1 tablet (50 mg total) by mouth 2 (two) times daily.    Dispense:  60 tablet    Refill:  11    Appointment please  . fluticasone (FLONASE) 50 MCG/ACT nasal spray    Sig: Place 2 sprays into both nostrils daily.    Dispense:  16 g    Refill:  11   Orders Placed This Encounter  Procedures  . CT CHEST LUNG CA SCREEN LOW DOSE W/O CM    Scheduling Instructions:     Please work with patient in regards to scheduling    Order Specific Question:   Reason for Exam (SYMPTOM  OR DIAGNOSIS REQUIRED)    Answer:   30 pack years    Order Specific Question:   Is the patient  pregnant?    Answer:   No  . MM Digital Screening    Standing Status:   Future    Standing Expiration Date:   08/07/2017    Order Specific Question:   Reason for Exam (SYMPTOM  OR DIAGNOSIS REQUIRED)    Answer:   yearly breast screening    Order Specific Question:   Is the patient pregnant?    Answer:   No    Order Specific Question:   Preferred imaging location?    Answer:   [facility]  . Flu Vaccine QUAD 36+ mos PF IM (Fluarix & Fluzone Quad PF)  . Tdap vaccine greater than or equal to 7yo IM  . Lipid panel  . CBC with Differential/Platelet  . COMPLETE METABOLIC PANEL WITH GFR  . HIV antibody  . Hepatitis C Antibody  . Ambulatory referral to Gastroenterology    Referral Priority:   Routine    Referral Type:   Consultation    Referral Reason:   Specialty Services Required    Number of Visits Requested:   1    Follow up plan: Return in about 1 year (around 06/09/2017) for complete physical.  An After Visit Summary was printed and given to the patient.

## 2016-06-10 LAB — HIV ANTIBODY (ROUTINE TESTING W REFLEX): HIV 1&2 Ab, 4th Generation: NONREACTIVE

## 2016-06-10 LAB — HEPATITIS C ANTIBODY: HCV Ab: NEGATIVE

## 2016-06-11 ENCOUNTER — Telehealth: Payer: Self-pay | Admitting: *Deleted

## 2016-06-11 ENCOUNTER — Encounter: Payer: Self-pay | Admitting: Family Medicine

## 2016-06-11 NOTE — Telephone Encounter (Signed)
Received referral for low dose lung cancer screening CT scan. Voicemail left at phone number listed in EMR for patient to call me back to facilitate scheduling scan.  

## 2016-06-17 ENCOUNTER — Telehealth: Payer: Self-pay | Admitting: *Deleted

## 2016-06-17 DIAGNOSIS — Z87891 Personal history of nicotine dependence: Secondary | ICD-10-CM

## 2016-06-17 NOTE — Telephone Encounter (Signed)
Received referral for initial lung cancer screening scan. Contacted patient and obtained smoking history,(former, quit 2007, 30 pack year) as well as answering questions related to screening process. Patient denies signs of lung cancer such as weight loss or hemoptysis. Patient denies comorbidity that would prevent curative treatment if lung cancer were found. Patient is tentatively scheduled for shared decision making visit and CT scan on 06/23/16, pending insurance approval from business office.

## 2016-06-23 ENCOUNTER — Ambulatory Visit: Payer: Medicare Other

## 2016-06-23 ENCOUNTER — Inpatient Hospital Stay: Payer: Medicare Other | Admitting: Oncology

## 2016-06-30 ENCOUNTER — Telehealth: Payer: Self-pay | Admitting: *Deleted

## 2016-06-30 DIAGNOSIS — M170 Bilateral primary osteoarthritis of knee: Secondary | ICD-10-CM | POA: Diagnosis not present

## 2016-06-30 NOTE — Telephone Encounter (Signed)
Received referral for low dose lung cancer screening CT scan. Voicemail left at phone number listed in EMR for patient to call me back to facilitate scheduling scan.  

## 2016-07-04 ENCOUNTER — Encounter: Payer: Self-pay | Admitting: Family Medicine

## 2016-07-07 ENCOUNTER — Encounter: Payer: Self-pay | Admitting: *Deleted

## 2016-07-08 MED ORDER — PITAVASTATIN CALCIUM 2 MG PO TABS: ORAL_TABLET | ORAL | 1 refills | Status: DC

## 2016-07-29 ENCOUNTER — Observation Stay
Admission: EM | Admit: 2016-07-29 | Discharge: 2016-07-30 | Disposition: A | Discharge status: Home / Self Care | Payer: Medicare Other | Attending: Internal Medicine | Admitting: Internal Medicine

## 2016-07-29 DIAGNOSIS — K449 Diaphragmatic hernia without obstruction or gangrene: Secondary | ICD-10-CM | POA: Diagnosis not present

## 2016-07-29 DIAGNOSIS — K2980 Duodenitis without bleeding: Secondary | ICD-10-CM | POA: Insufficient documentation

## 2016-07-29 DIAGNOSIS — R112 Nausea with vomiting, unspecified: Secondary | ICD-10-CM | POA: Diagnosis present

## 2016-07-29 DIAGNOSIS — F410 Panic disorder [episodic paroxysmal anxiety] without agoraphobia: Secondary | ICD-10-CM | POA: Diagnosis not present

## 2016-07-29 DIAGNOSIS — F900 Attention-deficit hyperactivity disorder, predominantly inattentive type: Secondary | ICD-10-CM | POA: Diagnosis not present

## 2016-07-29 DIAGNOSIS — K921 Melena: Secondary | ICD-10-CM | POA: Diagnosis not present

## 2016-07-29 DIAGNOSIS — I2510 Atherosclerotic heart disease of native coronary artery without angina pectoris: Secondary | ICD-10-CM | POA: Insufficient documentation

## 2016-07-29 DIAGNOSIS — K210 Gastro-esophageal reflux disease with esophagitis: Secondary | ICD-10-CM | POA: Diagnosis not present

## 2016-07-29 DIAGNOSIS — Z8249 Family history of ischemic heart disease and other diseases of the circulatory system: Secondary | ICD-10-CM | POA: Insufficient documentation

## 2016-07-29 DIAGNOSIS — E669 Obesity, unspecified: Secondary | ICD-10-CM | POA: Diagnosis not present

## 2016-07-29 DIAGNOSIS — B9681 Helicobacter pylori [H. pylori] as the cause of diseases classified elsewhere: Secondary | ICD-10-CM | POA: Insufficient documentation

## 2016-07-29 DIAGNOSIS — E039 Hypothyroidism, unspecified: Secondary | ICD-10-CM | POA: Insufficient documentation

## 2016-07-29 DIAGNOSIS — Z8711 Personal history of peptic ulcer disease: Secondary | ICD-10-CM | POA: Diagnosis not present

## 2016-07-29 DIAGNOSIS — Z8719 Personal history of other diseases of the digestive system: Secondary | ICD-10-CM | POA: Diagnosis not present

## 2016-07-29 DIAGNOSIS — K2950 Unspecified chronic gastritis without bleeding: Secondary | ICD-10-CM | POA: Insufficient documentation

## 2016-07-29 DIAGNOSIS — Z833 Family history of diabetes mellitus: Secondary | ICD-10-CM | POA: Insufficient documentation

## 2016-07-29 DIAGNOSIS — Z823 Family history of stroke: Secondary | ICD-10-CM | POA: Insufficient documentation

## 2016-07-29 DIAGNOSIS — K920 Hematemesis: Secondary | ICD-10-CM | POA: Diagnosis not present

## 2016-07-29 DIAGNOSIS — G8929 Other chronic pain: Secondary | ICD-10-CM | POA: Diagnosis not present

## 2016-07-29 DIAGNOSIS — M170 Bilateral primary osteoarthritis of knee: Secondary | ICD-10-CM | POA: Diagnosis not present

## 2016-07-29 DIAGNOSIS — Z6833 Body mass index (BMI) 33.0-33.9, adult: Secondary | ICD-10-CM | POA: Insufficient documentation

## 2016-07-29 DIAGNOSIS — I10 Essential (primary) hypertension: Secondary | ICD-10-CM | POA: Diagnosis not present

## 2016-07-29 DIAGNOSIS — Z7982 Long term (current) use of aspirin: Secondary | ICD-10-CM | POA: Insufficient documentation

## 2016-07-29 DIAGNOSIS — Z8619 Personal history of other infectious and parasitic diseases: Secondary | ICD-10-CM | POA: Insufficient documentation

## 2016-07-29 DIAGNOSIS — M940 Chondrocostal junction syndrome [Tietze]: Secondary | ICD-10-CM | POA: Diagnosis not present

## 2016-07-29 DIAGNOSIS — K922 Gastrointestinal hemorrhage, unspecified: Secondary | ICD-10-CM

## 2016-07-29 DIAGNOSIS — E785 Hyperlipidemia, unspecified: Secondary | ICD-10-CM | POA: Diagnosis not present

## 2016-07-29 DIAGNOSIS — K254 Chronic or unspecified gastric ulcer with hemorrhage: Secondary | ICD-10-CM | POA: Diagnosis not present

## 2016-07-29 DIAGNOSIS — J309 Allergic rhinitis, unspecified: Secondary | ICD-10-CM | POA: Diagnosis not present

## 2016-07-29 DIAGNOSIS — R197 Diarrhea, unspecified: Secondary | ICD-10-CM | POA: Diagnosis not present

## 2016-07-29 DIAGNOSIS — Z79899 Other long term (current) drug therapy: Secondary | ICD-10-CM | POA: Insufficient documentation

## 2016-07-29 DIAGNOSIS — Z87891 Personal history of nicotine dependence: Secondary | ICD-10-CM | POA: Diagnosis not present

## 2016-07-29 LAB — COMPREHENSIVE METABOLIC PANEL
ALT: 17 U/L (ref 14–54)
AST: 23 U/L (ref 15–41)
Albumin: 3.9 g/dL (ref 3.5–5.0)
Alkaline Phosphatase: 58 U/L (ref 38–126)
Anion gap: 10 (ref 5–15)
BUN: 22 mg/dL — ABNORMAL HIGH (ref 6–20)
CO2: 23 mmol/L (ref 22–32)
Calcium: 8.4 mg/dL — ABNORMAL LOW (ref 8.9–10.3)
Chloride: 105 mmol/L (ref 101–111)
Creatinine, Ser: 0.76 mg/dL (ref 0.44–1.00)
GFR calc Af Amer: >60 mL/min (ref >60)
GFR calc non Af Amer: >60 mL/min (ref >60)
Glucose, Bld: 115 mg/dL — ABNORMAL HIGH (ref 65–99)
Potassium: 4.2 mmol/L (ref 3.5–5.1)
Sodium: 138 mmol/L (ref 135–145)
Total Bilirubin: 0.5 mg/dL (ref 0.3–1.2)
Total Protein: 7.5 g/dL (ref 6.5–8.1)

## 2016-07-29 LAB — URINALYSIS, COMPLETE (UACMP) WITH MICROSCOPIC
Bilirubin Urine: NEGATIVE
Glucose, UA: NEGATIVE mg/dL
Ketones, ur: NEGATIVE mg/dL
Nitrite: NEGATIVE
Protein, ur: NEGATIVE mg/dL
Specific Gravity, Urine: 1.01 (ref 1.005–1.030)
pH: 5 (ref 5.0–8.0)

## 2016-07-29 LAB — ETHANOL: Alcohol, Ethyl (B): <5 mg/dL (ref <5)

## 2016-07-29 LAB — URINE DRUG SCREEN, QUALITATIVE (ARMC ONLY)
Amphetamines, Ur Screen: NOT DETECTED
Barbiturates, Ur Screen: NOT DETECTED
Benzodiazepine, Ur Scrn: POSITIVE — AB
Cannabinoid 50 Ng, Ur ~~LOC~~: NOT DETECTED
Cocaine Metabolite,Ur ~~LOC~~: NOT DETECTED
MDMA (Ecstasy)Ur Screen: NOT DETECTED
Methadone Scn, Ur: NOT DETECTED
Opiate, Ur Screen: NOT DETECTED
Phencyclidine (PCP) Ur S: NOT DETECTED
Tricyclic, Ur Screen: NOT DETECTED

## 2016-07-29 LAB — CBC
HCT: 40.6 % (ref 35.0–47.0)
Hemoglobin: 13.8 g/dL (ref 12.0–16.0)
MCH: 32.3 pg (ref 26.0–34.0)
MCHC: 34.1 g/dL (ref 32.0–36.0)
MCV: 94.8 fL (ref 80.0–100.0)
Platelets: 366 10*3/uL (ref 150–440)
RBC: 4.28 MIL/uL (ref 3.80–5.20)
RDW: 14 % (ref 11.5–14.5)
WBC: 7.6 10*3/uL (ref 3.6–11.0)

## 2016-07-29 LAB — TROPONIN I
Troponin I: <0.03 ng/mL (ref <0.03)
Troponin I: <0.03 ng/mL (ref <0.03)

## 2016-07-29 LAB — LIPASE, BLOOD: Lipase: 47 U/L (ref 11–51)

## 2016-07-29 LAB — POCT PREGNANCY, URINE: Preg Test, Ur: NEGATIVE

## 2016-07-29 LAB — HEMOGLOBIN: Hemoglobin: 13 g/dL (ref 12.0–16.0)

## 2016-07-29 MED ORDER — SODIUM CHLORIDE 0.9 % IV SOLN
80.0000 mg | Freq: Once | INTRAVENOUS | Status: AC
  Administered 2016-07-29: 80 mg via INTRAVENOUS
  Filled 2016-07-29 (×2): qty 80

## 2016-07-29 MED ORDER — MORPHINE SULFATE (PF) 4 MG/ML IV SOLN
4.0000 mg | Freq: Once | INTRAVENOUS | Status: AC
  Administered 2016-07-29: 4 mg via INTRAVENOUS
  Filled 2016-07-29: qty 1

## 2016-07-29 MED ORDER — FLUTICASONE PROPIONATE 50 MCG/ACT NA SUSP
2.0000 | Freq: Every day | NASAL | Status: DC
  Administered 2016-07-29 – 2016-07-30 (×2): 2 via NASAL
  Filled 2016-07-29: qty 16

## 2016-07-29 MED ORDER — AMPHETAMINE-DEXTROAMPHETAMINE 30 MG PO TABS
30.0000 mg | ORAL_TABLET | Freq: Two times a day (BID) | ORAL | Status: DC
  Filled 2016-07-29 (×2): qty 1

## 2016-07-29 MED ORDER — PANTOPRAZOLE SODIUM 40 MG IV SOLR: 40.0000 mg | Freq: Two times a day (BID) | INTRAVENOUS | Status: DC

## 2016-07-29 MED ORDER — ACYCLOVIR 400 MG PO TABS
400.0000 mg | ORAL_TABLET | Freq: Two times a day (BID) | ORAL | Status: DC
  Filled 2016-07-29 (×2): qty 1

## 2016-07-29 MED ORDER — SODIUM CHLORIDE 0.9 % IV SOLN
8.0000 mg/h | INTRAVENOUS | Status: DC
  Administered 2016-07-29 – 2016-07-30 (×2): 8 mg/h via INTRAVENOUS
  Filled 2016-07-29 (×3): qty 80

## 2016-07-29 MED ORDER — ACETAMINOPHEN 325 MG PO TABS: 650.0000 mg | ORAL_TABLET | Freq: Four times a day (QID) | ORAL | Status: DC | PRN

## 2016-07-29 MED ORDER — SODIUM CHLORIDE 0.9 % IV BOLUS (SEPSIS)
1000.0000 mL | Freq: Once | INTRAVENOUS | Status: AC
  Administered 2016-07-29: 1000 mL via INTRAVENOUS

## 2016-07-29 MED ORDER — ACETAMINOPHEN 650 MG RE SUPP: 650.0000 mg | Freq: Four times a day (QID) | RECTAL | Status: DC | PRN

## 2016-07-29 MED ORDER — ZOLPIDEM TARTRATE 5 MG PO TABS: 5.0000 mg | ORAL_TABLET | Freq: Every evening | ORAL | Status: DC | PRN

## 2016-07-29 MED ORDER — ACYCLOVIR 200 MG PO CAPS
400.0000 mg | ORAL_CAPSULE | Freq: Two times a day (BID) | ORAL | Status: DC
  Administered 2016-07-29 – 2016-07-30 (×2): 400 mg via ORAL
  Filled 2016-07-29: qty 2

## 2016-07-29 MED ORDER — ONDANSETRON HCL 4 MG/2ML IJ SOLN
4.0000 mg | Freq: Once | INTRAMUSCULAR | Status: AC
  Administered 2016-07-29: 4 mg via INTRAVENOUS
  Filled 2016-07-29: qty 2

## 2016-07-29 MED ORDER — DIAZEPAM 5 MG PO TABS
10.0000 mg | ORAL_TABLET | Freq: Three times a day (TID) | ORAL | Status: DC | PRN
  Administered 2016-07-29: 10 mg via ORAL
  Filled 2016-07-29: qty 2

## 2016-07-29 MED ORDER — METOPROLOL TARTRATE 50 MG PO TABS
50.0000 mg | ORAL_TABLET | Freq: Two times a day (BID) | ORAL | Status: DC
  Administered 2016-07-29 – 2016-07-30 (×2): 50 mg via ORAL
  Filled 2016-07-29 (×2): qty 1

## 2016-07-29 MED ORDER — METHYLPREDNISOLONE SODIUM SUCC 40 MG IJ SOLR
20.0000 mg | Freq: Once | INTRAMUSCULAR | Status: AC
  Administered 2016-07-29: 20 mg via INTRAVENOUS
  Filled 2016-07-29: qty 1

## 2016-07-29 MED ORDER — AMPHETAMINE-DEXTROAMPHETAMINE 5 MG PO TABS
30.0000 mg | ORAL_TABLET | Freq: Two times a day (BID) | ORAL | Status: DC
  Administered 2016-07-29 – 2016-07-30 (×2): 30 mg via ORAL
  Filled 2016-07-29 (×2): qty 6

## 2016-07-29 MED ORDER — OXYCODONE HCL 5 MG PO TABS
5.0000 mg | ORAL_TABLET | ORAL | Status: DC | PRN
  Administered 2016-07-29 – 2016-07-30 (×5): 5 mg via ORAL
  Filled 2016-07-29 (×5): qty 1

## 2016-07-29 NOTE — ED Provider Notes (Signed)
[facility] Emergency Department Provider Note  ____________________________________________  Time seen: Approximately 2:41 PM  I have reviewed the triage vital signs and the nursing notes.   HISTORY  Chief Complaint Emesis and Diarrhea   HPI Linda Mejia is a 61 y.o. female with a history of peptic ulcer disease who presents for evaluation of melena and coffee-ground emesis. Patient reports 2 episodes of coffee-ground emesis and 3 of melena starting today. Also started having left upper quadrant abdominal pain that she describes as sharp, intermittent, nonradiating, moderate in intensity. She reports that she takes 400-600 mg of Motrin almost every day for low back pain even though she was told by her GI doctor not to take them anymore. She reports that she drinks occasionally. She also reports she is feeling lightheaded since this morning. No syncope, no chest pain or shortness of breath. She is not on any blood thinners or  Past Medical History:  Diagnosis Date  . ADHD (attention deficit hyperactivity disorder)   . Anxiety   . Chronic back pain   . Coronary artery disease   . Herpes genitalis in women   . History of stomach ulcers    x7 this year. Bleeding  . Hypertension     Patient Active Problem List   Diagnosis Date Noted  . Nausea & vomiting 07/29/2016  . Hx of tobacco use, presenting hazards to health 06/09/2016  . Encounter for HIV (human immunodeficiency virus) test 06/09/2016  . Preventative health care 06/09/2016  . Screen for STD (sexually transmitted disease) 06/09/2016  . Degenerative arthritis of knee, bilateral 12/27/2015  . Obesity 12/27/2015  . GI bleed 09/21/2015  . Reflux esophagitis   . Other diseases of stomach and duodenum   . Duodenitis   . Subclinical hypothyroidism 06/06/2015  . Multiple joint pain 06/05/2015  . Other fatigue 06/05/2015  . Night sweats 06/05/2015  . Screening for tuberculosis 06/05/2015  . Headache  04/12/2015  . Subclinical hypothyroidism 03/26/2015  . Herpes simplex type 2 infection 03/21/2015  . Rhinitis, allergic 03/21/2015  . Cervical pain 09/21/2014  . ADD (attention deficit hyperactivity disorder, inattentive type) 09/20/2014  . Episodic paroxysmal anxiety disorder 09/20/2014  . History of gastric ulcer 09/20/2014  . Abuse, drug or alcohol 09/20/2014  . Breathlessness on exertion 08/28/2014  . HLD (hyperlipidemia) 10/03/2009  . Coronary artery disease involving native coronary artery of native heart with angina pectoris ([place]) 10/02/2009  . Hypertension goal BP (blood pressure) < 140/90 10/02/2009    Past Surgical History:  Procedure Laterality Date  . CARDIAC CATHETERIZATION    . CESAREAN SECTION    . ESOPHAGOGASTRODUODENOSCOPY (EGD) WITH PROPOFOL N/A 09/21/2015   Procedure: ESOPHAGOGASTRODUODENOSCOPY (EGD) WITH PROPOFOL;  Surgeon: Lucilla Lame, MD;  Location: [facility] ENDOSCOPY;  Service: Endoscopy;  Laterality: N/A;  . TONSILLECTOMY    . TUBAL LIGATION      Prior to Admission medications   Medication Sig Start Date End Date Taking? Authorizing Provider  acyclovir (ZOVIRAX) 400 MG tablet take 1 tablet by mouth twice a day 05/19/16  Yes Arnetha Courser, MD  amphetamine-dextroamphetamine (ADDERALL) 30 MG tablet Take 30 mg by mouth 2 (two) times daily.   Yes Historical Provider, MD  aspirin EC 81 MG tablet Take 81 mg by mouth daily.   Yes Historical Provider, MD  diazepam (VALIUM) 10 MG tablet Take 1 tablet (10 mg total) by mouth every 8 (eight) hours as needed for anxiety. 09/22/15  Yes Fritzi Mandes, MD  fluticasone (FLONASE) 50  MCG/ACT nasal spray Place 2 sprays into both nostrils daily. 06/09/16  Yes Arnetha Courser, MD  ibuprofen (ADVIL,MOTRIN) 200 MG tablet Take 600 mg by mouth every 6 (six) hours as needed.   Yes Historical Provider, MD  metoprolol (LOPRESSOR) 50 MG tablet Take 1 tablet (50 mg total) by mouth 2 (two) times daily. 06/09/16  Yes Arnetha Courser, MD  Pitavastatin  Calcium (LIVALO) 2 MG TABS One by mouth daily at bedtime (for cholesterol) Patient taking differently: Take 1 tablet by mouth daily. One by mouth daily at bedtime (for cholesterol) 07/08/16  Yes Arnetha Courser, MD  zolpidem (AMBIEN) 10 MG tablet Take 10 mg by mouth at bedtime as needed.     Yes Historical Provider, MD    Allergies Patient has no known allergies.  Family History  Problem Relation Age of Onset  . Diabetes Mother   . CVA Mother   . Heart disease Father   . Heart disease Sister   . Heart disease Brother   . Heart disease Sister   . Heart disease Sister   . Heart disease Sister   . Heart disease Brother   . Heart disease Brother   . Heart disease Brother     Social History Social History  Substance Use Topics  . Smoking status: Former Smoker    Packs/day: 1.00    Years: 30.00  . Smokeless tobacco: Never Used  . Alcohol use Yes     Comment: occasionally    Review of Systems  Constitutional: Negative for fever. Eyes: Negative for visual changes. ENT: Negative for sore throat. Neck: No neck pain  Cardiovascular: Negative for chest pain. Respiratory: Negative for shortness of breath. Gastrointestinal: + LUQ abdominal pain, vomiting and diarrhea. Genitourinary: Negative for dysuria. Musculoskeletal: Negative for back pain. Skin: Negative for rash. Neurological: Negative for headaches, weakness or numbness. Psych: No SI or HI  ____________________________________________   PHYSICAL EXAM:  VITAL SIGNS: ED Triage Vitals  Enc Vitals Group     BP 07/29/16 1400 (!) 170/100     Pulse Rate 07/29/16 1357 79     Resp 07/29/16 1357 (!) 23     Temp 07/29/16 1357 98.2 F (36.8 C)     Temp Source 07/29/16 1357 Oral     SpO2 07/29/16 1400 96 %     Weight 07/29/16 1359 200 lb (90.7 kg)     Height 07/29/16 1359 5\' 5"  (1.651 m)     Head Circumference --      Peak Flow --      Pain Score 07/29/16 1357 5     Pain Loc --      Pain Edu? --      Excl. in [place]? --       Constitutional: Alert and oriented. Well appearing and in no apparent distress. HEENT:      Head: Normocephalic and atraumatic.         Eyes: Conjunctivae are normal. Sclera is non-icteric. EOMI. PERRL      Mouth/Throat: Mucous membranes are moist.       Neck: Supple with no signs of meningismus. Cardiovascular: Regular rate and rhythm. No murmurs, gallops, or rubs. 2+ symmetrical distal pulses are present in all extremities. No JVD. Respiratory: Normal respiratory effort. Lungs are clear to auscultation bilaterally. No wheezes, crackles, or rhonchi.  Gastrointestinal: Soft, non tender, and non distended with positive bowel sounds. No rebound or guarding. Genitourinary: No CVA tenderness.Rectal exam with no stool in the vault Musculoskeletal: Nontender with  normal range of motion in all extremities. No edema, cyanosis, or erythema of extremities. Neurologic: Normal speech and language. Face is symmetric. Moving all extremities. No gross focal neurologic deficits are appreciated. Skin: Skin is warm, dry and intact. No rash noted. Psychiatric: Mood and affect are normal. Speech and behavior are normal.  ____________________________________________   LABS (all labs ordered are listed, but only abnormal results are displayed)  Labs Reviewed  COMPREHENSIVE METABOLIC PANEL - Abnormal; Notable for the following:       Result Value   Glucose, Bld 115 (*)    BUN 22 (*)    Calcium 8.4 (*)    All other components within normal limits  URINALYSIS, COMPLETE (UACMP) WITH MICROSCOPIC - Abnormal; Notable for the following:    Color, Urine YELLOW (*)    APPearance HAZY (*)    Hgb urine dipstick SMALL (*)    Leukocytes, UA MODERATE (*)    Bacteria, UA RARE (*)    Squamous Epithelial / LPF 0-5 (*)    All other components within normal limits  URINE DRUG SCREEN, QUALITATIVE ([facility] ONLY) - Abnormal; Notable for the following:    Benzodiazepine, Ur Scrn POSITIVE (*)    All other components within  normal limits  LIPASE, BLOOD  CBC  ETHANOL  TROPONIN I  HEMOGLOBIN  TROPONIN I  BASIC METABOLIC PANEL  CBC  TROPONIN I  POC URINE PREG, ED  POCT PREGNANCY, URINE  TYPE AND SCREEN   ____________________________________________  EKG  none ____________________________________________  RADIOLOGY  none  ____________________________________________   PROCEDURES  Procedure(s) performed: None Procedures Critical Care performed:  None ____________________________________________   INITIAL IMPRESSION / ASSESSMENT AND PLAN / ED COURSE  61 y.o. female with a history of peptic ulcer disease who presents for evaluation of melena and coffee-ground emesis since this morning. Patient is well-appearing, in no distress, she is normal vital signs, rectal exam with no stool in the rectal vault. Patient's last endoscopy was 2 years ago. She has had 7 bleeding ulcers in the past. She is followed by Dr. Donnella Sham. Patient is HD stable. hgb is stable. GI is on call. Will start patient on protonix, give IVF, type and screen and admit to Hospitalist.     Pertinent labs & imaging results that were available during my care of the patient were reviewed by me and considered in my medical decision making (see chart for details).    ____________________________________________   FINAL CLINICAL IMPRESSION(S) / ED DIAGNOSES  Final diagnoses:  UGIB (upper gastrointestinal bleed)      NEW MEDICATIONS STARTED DURING THIS VISIT:  Current Discharge Medication List       Note:  This document was prepared using Dragon voice recognition software and may include unintentional dictation errors.    Rudene Re, MD 07/29/16 2027

## 2016-07-29 NOTE — ED Notes (Signed)
Called lab to make sure we could add on those extra labs Dr. Alfred Levins added on. They stated they could and would run the tests.

## 2016-07-29 NOTE — H&P (Signed)
[place] [facility] [place] NAME: Linda Mejia    MR#:  [id number]  DATE OF BIRTH:  July 18, 1955  DATE OF ADMISSION:  07/29/2016  PRIMARY CARE PHYSICIAN: Enid Derry, MD   REQUESTING/REFERRING PHYSICIAN: Dr Gonzella Lex  CHIEF COMPLAINT:   Chief Complaint  Patient presents with  . Emesis  . Diarrhea    HISTORY OF PRESENT ILLNESS:  Linda Mejia  is a 61 y.o. female presents with not feeling well since Easter. At that time had coffee-ground emesis. She just felt very run down. Under her left chest hurts with moving around and bending and twisting. This a.m. at 5:30 she had a couple episodes of coffee-ground emesis. She is having pain in her stomach. She is feeling dizzy. She is having stomach swelling. She's hungry all the time. This morning she had 3 episodes of black diarrhea. In the ER, she is hemodynamically stable and hospitalist services were contacted for admission.  PAST MEDICAL HISTORY:   Past Medical History:  Diagnosis Date  . ADHD (attention deficit hyperactivity disorder)   . Anxiety   . Chronic back pain   . Coronary artery disease   . Herpes genitalis in women   . History of stomach ulcers    x7 this year. Bleeding  . Hypertension     PAST SURGICAL HISTORY:   Past Surgical History:  Procedure Laterality Date  . CARDIAC CATHETERIZATION    . CESAREAN SECTION    . ESOPHAGOGASTRODUODENOSCOPY (EGD) WITH PROPOFOL N/A 09/21/2015   Procedure: ESOPHAGOGASTRODUODENOSCOPY (EGD) WITH PROPOFOL;  Surgeon: Lucilla Lame, MD;  Location: [facility] ENDOSCOPY;  Service: Endoscopy;  Laterality: N/A;  . TONSILLECTOMY    . TUBAL LIGATION      SOCIAL HISTORY:   Social History  Substance Use Topics  . Smoking status: Former Smoker    Packs/day: 1.00    Years: 30.00  . Smokeless tobacco: Never Used  . Alcohol use Yes     Comment: occasionally    FAMILY HISTORY:   Family History  Problem Relation Age of Onset  . Diabetes Mother   .  CVA Mother   . Heart disease Father   . Heart disease Sister   . Heart disease Brother   . Heart disease Sister   . Heart disease Sister   . Heart disease Sister   . Heart disease Brother   . Heart disease Brother   . Heart disease Brother     DRUG ALLERGIES:  No Known Allergies  REVIEW OF SYSTEMS:  CONSTITUTIONAL: No fever. Positive for sweats. Positive for weight gain. Positive for fatigue.  EYES: Wears reading glasses EARS, NOSE, AND THROAT: No tinnitus or ear pain. No sore throat. Positive for runny nose RESPIRATORY: No cough, shortness of breath, wheezing or hemoptysis.  CARDIOVASCULAR: Left-sided chest pain when bending or twisting. No orthopnea, edema.  GASTROINTESTINAL: Positive nausea, vomiting, diarrhea and abdominal pain. Coffee-ground emesis and black diarrhea. GENITOURINARY: No dysuria, hematuria.  ENDOCRINE: No polyuria, nocturia,  HEMATOLOGY: No anemia, easy bruising or bleeding SKIN: No rash or lesion. Positive for itching MUSCULOSKELETAL: Positive for joint pain all over especially her left knee NEUROLOGIC: No tingling, numbness, weakness.  PSYCHIATRY: History of anxiety and panic attacks   MEDICATIONS AT HOME:   Prior to Admission medications   Medication Sig Start Date End Date Taking? Authorizing Provider  acyclovir (ZOVIRAX) 400 MG tablet take 1 tablet by mouth twice a day 05/19/16   Arnetha Courser, MD  amphetamine-dextroamphetamine (ADDERALL) 30 MG  tablet Take 30 mg by mouth 2 (two) times daily.    Historical Provider, MD  aspirin EC 81 MG tablet Take 81 mg by mouth daily.    Historical Provider, MD  diazepam (VALIUM) 10 MG tablet Take 1 tablet (10 mg total) by mouth every 8 (eight) hours as needed for anxiety. 09/22/15   Fritzi Mandes, MD  fluticasone (FLONASE) 50 MCG/ACT nasal spray Place 2 sprays into both nostrils daily. 06/09/16   Arnetha Courser, MD  metoprolol (LOPRESSOR) 50 MG tablet Take 1 tablet (50 mg total) by mouth 2 (two) times daily. 06/09/16    Arnetha Courser, MD  Pitavastatin Calcium (LIVALO) 2 MG TABS One by mouth daily at bedtime (for cholesterol) 07/08/16   Arnetha Courser, MD  zolpidem (AMBIEN) 10 MG tablet Take 10 mg by mouth at bedtime as needed.      Historical Provider, MD      VITAL SIGNS:  Blood pressure (!) 143/78, pulse 67, temperature 98.2 F (36.8 C), temperature source Oral, resp. rate 20, height 5\' 5"  (1.651 m), weight 90.7 kg (200 lb), SpO2 95 %.  PHYSICAL EXAMINATION:  GENERAL:  61 y.o.-year-old patient lying in the bed with no acute distress.  EYES: Pupils equal, round, reactive to light and accommodation. No scleral icterus. Extraocular muscles intact.  HEENT: Head atraumatic, normocephalic. Oropharynx and nasopharynx clear.  NECK:  Supple, no jugular venous distention. No thyroid enlargement, no tenderness.  LUNGS: Normal breath sounds bilaterally, no wheezing, rales,rhonchi or crepitation. No use of accessory muscles of respiration.  CARDIOVASCULAR: S1, S2 normal. No murmurs, rubs, or gallops.  Left chest wall pain to palpation ABDOMEN: Soft, left upper quadrant tenderness, nondistended. Bowel sounds present. No organomegaly or mass.  EXTREMITIES: No pedal edema, cyanosis, or clubbing.  NEUROLOGIC: Cranial nerves II through XII are intact. Muscle strength 5/5 in all extremities. Sensation intact. Gait not checked.  PSYCHIATRIC: The patient is alert and oriented x 3.  SKIN: No rash, lesion, or ulcer.   LABORATORY PANEL:   CBC  Recent Labs Lab 07/29/16 1401  WBC 7.6  HGB 13.8  HCT 40.6  PLT 366   ------------------------------------------------------------------------------------------------------------------  Chemistries   Recent Labs Lab 07/29/16 1401  NA 138  K 4.2  CL 105  CO2 23  GLUCOSE 115*  BUN 22*  CREATININE 0.76  CALCIUM 8.4*  AST 23  ALT 17  ALKPHOS 58  BILITOT 0.5    ------------------------------------------------------------------------------------------------------------------    EKG:   Ordered by me  IMPRESSION AND PLAN:   1. Upper GI bleed. Left upper quadrant abdominal pain with nausea vomiting with coffee-ground emesis and 3 episodes of black stools. Patient is hemodynamically stable. Serial hemoglobins. Case discussed with Dr. Vicente Males gastroenterology to see the patient tomorrow morning for endoscopy. Patient placed on Protonix drip. Hold aspirin. 2. History of coronary artery disease on metoprolol. Hold aspirin 3. Left-sided chest discomfort which is a costochondritis. One dose of Solu-Medrol. Reproducible chest pain. We'll also get cardiac enzyme 4. Anxiety with panic attacks on Valium    All the records are reviewed and case discussed with ED provider. Management plans discussed with the patient, family and they are in agreement.  CODE STATUS: Full code  TOTAL TIME TAKING CARE OF THIS PATIENT: 50 minutes.    [name] M.D on 07/29/2016 at 3:35 PM  Between 7am to 6pm - Pager - [phone]  After 6pm call admission pager [phone]  [facility] Office  [phone]  CC: Primary care physician; Enid Derry, MD

## 2016-07-29 NOTE — ED Triage Notes (Signed)
Pt presents to ED via [facility] from home for N&V&D since Easter. Pt has L upper abd pain. Distention noticed to R lower abd. Pt has been taking ODT zofran at home. Not nauseous upon arrival but holding L upper abd. Pt alert, oriented. Hx of bleeding ulcers and bowel obstruction, hx of MI. Pt states coffee ground/black emesis and stool.

## 2016-07-29 NOTE — ED Notes (Signed)
Dr Wieting at bedside 

## 2016-07-30 ENCOUNTER — Encounter
Admission: EM | Disposition: A | Discharge status: Home / Self Care | Payer: Self-pay | Source: Home / Self Care | Attending: Emergency Medicine

## 2016-07-30 ENCOUNTER — Observation Stay: Payer: Medicare Other | Admitting: Anesthesiology

## 2016-07-30 ENCOUNTER — Encounter: Payer: Self-pay | Admitting: Anesthesiology

## 2016-07-30 DIAGNOSIS — K2900 Acute gastritis without bleeding: Secondary | ICD-10-CM | POA: Diagnosis not present

## 2016-07-30 DIAGNOSIS — K2990 Gastroduodenitis, unspecified, without bleeding: Secondary | ICD-10-CM | POA: Diagnosis not present

## 2016-07-30 DIAGNOSIS — K922 Gastrointestinal hemorrhage, unspecified: Secondary | ICD-10-CM

## 2016-07-30 DIAGNOSIS — K2980 Duodenitis without bleeding: Secondary | ICD-10-CM | POA: Diagnosis not present

## 2016-07-30 DIAGNOSIS — K2970 Gastritis, unspecified, without bleeding: Secondary | ICD-10-CM | POA: Diagnosis not present

## 2016-07-30 DIAGNOSIS — K259 Gastric ulcer, unspecified as acute or chronic, without hemorrhage or perforation: Secondary | ICD-10-CM

## 2016-07-30 DIAGNOSIS — I10 Essential (primary) hypertension: Secondary | ICD-10-CM | POA: Diagnosis not present

## 2016-07-30 DIAGNOSIS — B9681 Helicobacter pylori [H. pylori] as the cause of diseases classified elsewhere: Secondary | ICD-10-CM | POA: Diagnosis not present

## 2016-07-30 DIAGNOSIS — Z791 Long term (current) use of non-steroidal anti-inflammatories (NSAID): Secondary | ICD-10-CM | POA: Diagnosis not present

## 2016-07-30 DIAGNOSIS — I2510 Atherosclerotic heart disease of native coronary artery without angina pectoris: Secondary | ICD-10-CM | POA: Diagnosis not present

## 2016-07-30 DIAGNOSIS — M1990 Unspecified osteoarthritis, unspecified site: Secondary | ICD-10-CM | POA: Diagnosis not present

## 2016-07-30 HISTORY — PX: ESOPHAGOGASTRODUODENOSCOPY (EGD) WITH PROPOFOL: SHX5813

## 2016-07-30 LAB — BASIC METABOLIC PANEL
Anion gap: 7 (ref 5–15)
BUN: 15 mg/dL (ref 6–20)
CO2: 25 mmol/L (ref 22–32)
Calcium: 8.3 mg/dL — ABNORMAL LOW (ref 8.9–10.3)
Chloride: 105 mmol/L (ref 101–111)
Creatinine, Ser: 0.7 mg/dL (ref 0.44–1.00)
GFR calc Af Amer: >60 mL/min (ref >60)
GFR calc non Af Amer: >60 mL/min (ref >60)
Glucose, Bld: 107 mg/dL — ABNORMAL HIGH (ref 65–99)
Potassium: 4 mmol/L (ref 3.5–5.1)
Sodium: 137 mmol/L (ref 135–145)

## 2016-07-30 LAB — CBC
HCT: 37.7 % (ref 35.0–47.0)
Hemoglobin: 12.8 g/dL (ref 12.0–16.0)
MCH: 32.2 pg (ref 26.0–34.0)
MCHC: 33.9 g/dL (ref 32.0–36.0)
MCV: 95.2 fL (ref 80.0–100.0)
Platelets: 339 10*3/uL (ref 150–440)
RBC: 3.97 MIL/uL (ref 3.80–5.20)
RDW: 14 % (ref 11.5–14.5)
WBC: 10 10*3/uL (ref 3.6–11.0)

## 2016-07-30 LAB — TYPE AND SCREEN
ABO/RH(D): B POS
Antibody Screen: NEGATIVE

## 2016-07-30 LAB — HEMOGLOBIN: Hemoglobin: 13.7 g/dL (ref 12.0–16.0)

## 2016-07-30 LAB — TROPONIN I: Troponin I: <0.03 ng/mL (ref <0.03)

## 2016-07-30 SURGERY — ESOPHAGOGASTRODUODENOSCOPY (EGD) WITH PROPOFOL
Anesthesia: General

## 2016-07-30 MED ORDER — PROPOFOL 10 MG/ML IV BOLUS
INTRAVENOUS | Status: AC
  Filled 2016-07-30: qty 40

## 2016-07-30 MED ORDER — PROPOFOL 10 MG/ML IV BOLUS
INTRAVENOUS | Status: DC | PRN
  Administered 2016-07-30: 80 mg via INTRAVENOUS

## 2016-07-30 MED ORDER — LACTATED RINGERS IV SOLN
INTRAVENOUS | Status: DC | PRN
  Administered 2016-07-30: 12:00:00 via INTRAVENOUS

## 2016-07-30 MED ORDER — LIDOCAINE HCL (PF) 2 % IJ SOLN
INTRAMUSCULAR | Status: AC
  Filled 2016-07-30: qty 2

## 2016-07-30 MED ORDER — PANTOPRAZOLE SODIUM 40 MG PO TBEC
40.0000 mg | DELAYED_RELEASE_TABLET | Freq: Every day | ORAL | 0 refills | Status: DC
Start: 2016-07-30 — End: 2017-03-18

## 2016-07-30 NOTE — Anesthesia Post-op Follow-up Note (Cosign Needed)
Anesthesia QCDR form completed.        

## 2016-07-30 NOTE — Op Note (Signed)
[facility] Gastroenterology Patient Name: Linda Mejia Procedure Date: 07/30/2016 12:10 PM MRN: [id number] Account #: [id number] Date of Birth: [birth year] Admit Type: Inpatient Age: 61 Room: [facility] ENDO ROOM 3 Gender: Female Note Status: Finalized Procedure:            Upper GI endoscopy Indications:          Hematemesis Providers:            Jonathon Bellows MD, MD Referring MD:         Dwana Curd. Marzetta Board (Referring MD) Medicines:            Monitored Anesthesia Care Complications:        No immediate complications. Procedure:            Pre-Anesthesia Assessment:                       - Prior to the procedure, a History and Physical was                        performed, and patient medications, allergies and                        sensitivities were reviewed. The patient's tolerance of                        previous anesthesia was reviewed.                       - The risks and benefits of the procedure and the                        sedation options and risks were discussed with the                        patient. All questions were answered and informed                        consent was obtained.                       - ASA Grade Assessment: III - A patient with severe                        systemic disease.                       After obtaining informed consent, the endoscope was                        passed under direct vision. Throughout the procedure,                        the patient's blood pressure, pulse, and oxygen                        saturations were monitored continuously. The Endoscope                        was introduced through the mouth, and advanced to the  third part of duodenum. The upper GI endoscopy was                        accomplished with ease. The patient tolerated the                        procedure well. Findings:      The esophagus was normal.      Three non-bleeding superficial gastric ulcers with a clean  ulcer base       (Forrest Class III) were found in the gastric antrum. The largest lesion       was 6 mm in largest dimension. Biopsies were taken with a cold forceps       for histology.      Localized moderate inflammation characterized by congestion (edema) and       erythema was found in the gastric antrum. Biopsies were taken with a       cold forceps for histology.      Localized moderate inflammation characterized by congestion (edema) and       erythema was found in the duodenal bulb. Biopsies were taken with a cold       forceps for histology. Impression:           - Normal esophagus.                       - Non-bleeding gastric ulcers with a clean ulcer base                        (Forrest Class III). Biopsied.                       - Gastritis. Biopsied.                       - Duodenitis. Biopsied. Recommendation:       - Await pathology results.                       - Return patient to hospital ward for ongoing care.                       - Resume previous diet.                       - Continue present medications.                       - Await pathology results.                       - 1. Stop Ibuprofen                       2. Check stool for H pylori pylori antigen                       3. PPI for 6 weeks oral                       4. Outpatient GI follow up for her abdominal pain                       5. I will sign  out , please call with questions Procedure Code(s):    --- Professional ---                       [phone], Esophagogastroduodenoscopy, flexible, transoral;                        with biopsy, single or multiple Diagnosis Code(s):    --- Professional ---                       K25.9, Gastric ulcer, unspecified as acute or chronic,                        without hemorrhage or perforation                       K29.70, Gastritis, unspecified, without bleeding                       K29.80, Duodenitis without bleeding                       K92.0, Hematemesis CPT  copyright 2016 American Medical Association. All rights reserved. The codes documented in this report are preliminary and upon coder review may  be revised to meet current compliance requirements. Jonathon Bellows, MD Jonathon Bellows MD, MD 07/30/2016 12:25:51 PM This report has been signed electronically. Number of Addenda: 0 Note Initiated On: 07/30/2016 12:10 PM      [facility]

## 2016-07-30 NOTE — Progress Notes (Signed)
Per MD okay to give oral meds with a sip of water.

## 2016-07-30 NOTE — Anesthesia Preprocedure Evaluation (Signed)
Anesthesia Evaluation  Patient identified by MRN, date of birth, ID band Patient awake    Reviewed: Allergy & Precautions, NPO status , Patient's Chart, lab work & pertinent test results, reviewed documented beta blocker date and time   Airway Mallampati: II  TM Distance: >3 FB     Dental  (+) Chipped   Pulmonary former smoker,           Cardiovascular hypertension, Pt. on medications and Pt. on home beta blockers + angina + CAD       Neuro/Psych  Headaches, PSYCHIATRIC DISORDERS Anxiety    GI/Hepatic   Endo/Other  Hypothyroidism   Renal/GU      Musculoskeletal  (+) Arthritis ,   Abdominal   Peds  Hematology   Anesthesia Other Findings ADD  Reproductive/Obstetrics                             Anesthesia Physical Anesthesia Plan  ASA: III  Anesthesia Plan: General   Post-op Pain Management:    Induction: Intravenous  Airway Management Planned:   Additional Equipment:   Intra-op Plan:   Post-operative Plan:   Informed Consent: I have reviewed the patients History and Physical, chart, labs and discussed the procedure including the risks, benefits and alternatives for the proposed anesthesia with the patient or authorized representative who has indicated his/her understanding and acceptance.     Plan Discussed with: CRNA  Anesthesia Plan Comments:         Anesthesia Quick Evaluation

## 2016-07-30 NOTE — Discharge Instructions (Signed)
Gastrointestinal Bleeding °Gastrointestinal bleeding is bleeding somewhere along the path food travels through the body (digestive tract). This path is anywhere between the mouth and the opening of the butt (anus). You may have blood in your poop (stools) or have black poop. If you throw up (vomit), there may be blood in it. °This condition can be mild, serious, or even life-threatening. If you have a lot of bleeding, you may need to stay in the hospital. °Follow these instructions at home: °· Take over-the-counter and prescription medicines only as told by your doctor. °· Eat foods that have a lot of fiber in them. These foods include whole grains, fruits, and vegetables. You can also try eating 1-3 prunes each day. °· Drink enough fluid to keep your pee (urine) clear or pale yellow. °· Keep all follow-up visits as told by your doctor. This is important. °Contact a doctor if: °· Your symptoms do not get better. °Get help right away if: °· Your bleeding gets worse. °· You feel dizzy or you pass out (faint). °· You feel weak. °· You have very bad cramps in your back or belly (abdomen). °· You pass large clumps of blood (clots) in your poop. °· Your symptoms are getting worse. °This information is not intended to replace advice given to you by your health care provider. Make sure you discuss any questions you have with your health care provider. °Document Released: 01/08/2008 Document Revised: 09/06/2015 Document Reviewed: 09/18/2014 °Elsevier Interactive Patient Education © 2017 Elsevier Inc. ° °

## 2016-07-30 NOTE — Care Management Obs Status (Deleted)
[facility] NOTIFICATION   Patient Details  Name: CHARLESA EHLE MRN: [id number] Date of Birth: [birth year]   Medicare Observation Status Notification Given:   Chauncey Mann, RN 07/30/2016, [phone]

## 2016-07-30 NOTE — Transfer of Care (Signed)
Immediate Anesthesia Transfer of Care Note  Patient: Linda Mejia  Procedure(s) Performed: Procedure(s): ESOPHAGOGASTRODUODENOSCOPY (EGD) WITH PROPOFOL (N/A)  Patient Location: PACU  Anesthesia Type:General  Level of Consciousness: sedated  Airway & Oxygen Therapy: Patient Spontanous Breathing and Patient connected to nasal cannula oxygen  Post-op Assessment: Report given to RN and Post -op Vital signs reviewed and stable  Post vital signs: Reviewed and stable  Last Vitals:  Vitals:   07/30/16 1024 07/30/16 1153  BP: (!) 156/69 (!) 161/92  Pulse: 68 63  Resp:  18  Temp:  36.4 C    Last Pain:  Vitals:   07/30/16 1153  TempSrc: Tympanic  PainSc:       Patients Stated Pain Goal: 0 (19/41/74 0814)  Complications: No apparent anesthesia complications

## 2016-07-30 NOTE — Progress Notes (Signed)
EGD- see procedure note for recommendations  Dr Jonathon Bellows  Gastroenterology/Hepatology Pager: [phone]

## 2016-07-30 NOTE — H&P (Signed)
Jonathon Bellows MD [address] [place], [place] [address] Phone: [phone] Fax : [phone]  Primary Care Physician:  Enid Derry, MD Primary Gastroenterologist:  Dr. Jonathon Bellows   Pre-Procedure History & Physical: HPI:  Linda Mejia is a 61 y.o. female is here for an endoscopy.   Past Medical History:  Diagnosis Date  . ADHD (attention deficit hyperactivity disorder)   . Anxiety   . Chronic back pain   . Coronary artery disease   . Herpes genitalis in women   . History of stomach ulcers    x7 this year. Bleeding  . Hypertension     Past Surgical History:  Procedure Laterality Date  . CARDIAC CATHETERIZATION    . CESAREAN SECTION    . ESOPHAGOGASTRODUODENOSCOPY (EGD) WITH PROPOFOL N/A 09/21/2015   Procedure: ESOPHAGOGASTRODUODENOSCOPY (EGD) WITH PROPOFOL;  Surgeon: Lucilla Lame, MD;  Location: [facility] ENDOSCOPY;  Service: Endoscopy;  Laterality: N/A;  . TONSILLECTOMY    . TUBAL LIGATION      Prior to Admission medications   Medication Sig Start Date End Date Taking? Authorizing Provider  acyclovir (ZOVIRAX) 400 MG tablet take 1 tablet by mouth twice a day 05/19/16  Yes Arnetha Courser, MD  amphetamine-dextroamphetamine (ADDERALL) 30 MG tablet Take 30 mg by mouth 2 (two) times daily.   Yes Historical Provider, MD  aspirin EC 81 MG tablet Take 81 mg by mouth daily.   Yes Historical Provider, MD  diazepam (VALIUM) 10 MG tablet Take 1 tablet (10 mg total) by mouth every 8 (eight) hours as needed for anxiety. 09/22/15  Yes Fritzi Mandes, MD  fluticasone (FLONASE) 50 MCG/ACT nasal spray Place 2 sprays into both nostrils daily. 06/09/16  Yes Arnetha Courser, MD  ibuprofen (ADVIL,MOTRIN) 200 MG tablet Take 600 mg by mouth every 6 (six) hours as needed.   Yes Historical Provider, MD  metoprolol (LOPRESSOR) 50 MG tablet Take 1 tablet (50 mg total) by mouth 2 (two) times daily. 06/09/16  Yes Arnetha Courser, MD  Pitavastatin Calcium (LIVALO) 2 MG TABS One by mouth daily at bedtime (for  cholesterol) Patient taking differently: Take 1 tablet by mouth daily. One by mouth daily at bedtime (for cholesterol) 07/08/16  Yes Arnetha Courser, MD  zolpidem (AMBIEN) 10 MG tablet Take 10 mg by mouth at bedtime as needed.     Yes Historical Provider, MD    Allergies as of 07/29/2016  . (No Known Allergies)    Family History  Problem Relation Age of Onset  . Diabetes Mother   . CVA Mother   . Heart disease Father   . Heart disease Sister   . Heart disease Brother   . Heart disease Sister   . Heart disease Sister   . Heart disease Sister   . Heart disease Brother   . Heart disease Brother   . Heart disease Brother     Social History   Social History  . Marital status: Widowed    Spouse name: N/A  . Number of children: N/A  . Years of education: N/A   Occupational History  . disabled    Social History Main Topics  . Smoking status: Former Smoker    Packs/day: 1.00    Years: 30.00  . Smokeless tobacco: Never Used  . Alcohol use Yes     Comment: occasionally  . Drug use: No  . Sexual activity: No   Other Topics Concern  . Not on file   Social History Narrative  .  No narrative on file    Review of Systems: See HPI, otherwise negative ROS  Physical Exam: BP (!) 161/92   Pulse 63   Temp 97.6 F (36.4 C) (Tympanic)   Resp 18   Ht 5\' 5"  (1.651 m)   Wt 201 lb 11.5 oz (91.5 kg)   SpO2 96%   BMI 33.57 kg/m  General:   Alert,  pleasant and cooperative in NAD Head:  Normocephalic and atraumatic. Neck:  Supple; no masses or thyromegaly. Lungs:  Clear throughout to auscultation.    Heart:  Regular rate and rhythm. Abdomen:  Soft, nontender and nondistended. Normal bowel sounds, without guarding, and without rebound.   Neurologic:  Alert and  oriented x4;  grossly normal neurologically.  Impression/Plan: Linda Mejia is here for an endoscopy to be performed for hematemesis   Risks, benefits, limitations, and alternatives regarding  endoscopy have been  reviewed with the patient.  Questions have been answered.  All parties agreeable.   Jonathon Bellows, MD  07/30/2016, 12:03 PM

## 2016-07-30 NOTE — Progress Notes (Signed)
Okay per Dr. Benjie Karvonen to place pt on regular diet

## 2016-07-30 NOTE — Anesthesia Postprocedure Evaluation (Signed)
Anesthesia Post Note  Patient: EURIKA SANDY  Procedure(s) Performed: Procedure(s) (LRB): ESOPHAGOGASTRODUODENOSCOPY (EGD) WITH PROPOFOL (N/A)  Patient location during evaluation: Endoscopy Anesthesia Type: General Level of consciousness: awake and alert Pain management: pain level controlled Vital Signs Assessment: post-procedure vital signs reviewed and stable Respiratory status: spontaneous breathing, nonlabored ventilation, respiratory function stable and patient connected to nasal cannula oxygen Cardiovascular status: blood pressure returned to baseline and stable Postop Assessment: no signs of nausea or vomiting Anesthetic complications: no     Last Vitals:  Vitals:   07/30/16 1248 07/30/16 1258  BP: (!) 141/90 (!) 151/83  Pulse: 66 74  Resp: 12 15  Temp:      Last Pain:  Vitals:   07/30/16 1228  TempSrc: Tympanic  PainSc:                  Artrice Kraker S

## 2016-07-30 NOTE — Progress Notes (Signed)
Pt A and O x 4. VSS. Pt tolerating diet well. No complaints of pain or nausea. IV removed intact, prescriptions given. Pt voiced understanding of discharge instructions with no further questions. Pt discharged via wheelchair with nurse.

## 2016-07-30 NOTE — Consult Note (Signed)
Jonathon Bellows MD  [address] [place], [place] [address] Phone: [phone] Fax : [phone]  Consultation  Referring Provider:   Dr Leslye Peer  Primary Care Physician:  Enid Derry, MD Primary Gastroenterologist:  None          Reason for Consultation:     Gi bleed  Date of Admission:  07/29/2016 Date of Consultation:  07/30/2016         HPI:   Linda Mejia is a 61 y.o. female presented to the Er yestrerday afternoon with a few episodes of coffee ground emesis with abdominal pain. She also had a few episodes of melena .   Hb on admission was 13.8 grams. EGD in 12/2015 showed grade A esophagitis, gastritis and a small hiatal hernia.  She says the first time she threw up "coffee grounds " was on easter Sunday and occurred 3 times then she had none till yesterday when she had 2 further similar episodes. She also had 1 episode of tarry black stool yesterday . She also says she takes 2-3 ibuprofen every day for many months for back pain. She has also had some left upper abdominal discomfort for a while. She takes a baby asprin daily but no other blood thinners.   Overnight since admission she has not had any further episodes    Past Medical History:  Diagnosis Date  . ADHD (attention deficit hyperactivity disorder)   . Anxiety   . Chronic back pain   . Coronary artery disease   . Herpes genitalis in women   . History of stomach ulcers    x7 this year. Bleeding  . Hypertension     Past Surgical History:  Procedure Laterality Date  . CARDIAC CATHETERIZATION    . CESAREAN SECTION    . ESOPHAGOGASTRODUODENOSCOPY (EGD) WITH PROPOFOL N/A 09/21/2015   Procedure: ESOPHAGOGASTRODUODENOSCOPY (EGD) WITH PROPOFOL;  Surgeon: Lucilla Lame, MD;  Location: [facility] ENDOSCOPY;  Service: Endoscopy;  Laterality: N/A;  . TONSILLECTOMY    . TUBAL LIGATION      Prior to Admission medications   Medication Sig Start Date End Date Taking? Authorizing Provider  acyclovir (ZOVIRAX) 400 MG tablet take 1 tablet  by mouth twice a day 05/19/16  Yes Arnetha Courser, MD  amphetamine-dextroamphetamine (ADDERALL) 30 MG tablet Take 30 mg by mouth 2 (two) times daily.   Yes Historical Provider, MD  aspirin EC 81 MG tablet Take 81 mg by mouth daily.   Yes Historical Provider, MD  diazepam (VALIUM) 10 MG tablet Take 1 tablet (10 mg total) by mouth every 8 (eight) hours as needed for anxiety. 09/22/15  Yes Fritzi Mandes, MD  fluticasone (FLONASE) 50 MCG/ACT nasal spray Place 2 sprays into both nostrils daily. 06/09/16  Yes Arnetha Courser, MD  ibuprofen (ADVIL,MOTRIN) 200 MG tablet Take 600 mg by mouth every 6 (six) hours as needed.   Yes Historical Provider, MD  metoprolol (LOPRESSOR) 50 MG tablet Take 1 tablet (50 mg total) by mouth 2 (two) times daily. 06/09/16  Yes Arnetha Courser, MD  Pitavastatin Calcium (LIVALO) 2 MG TABS One by mouth daily at bedtime (for cholesterol) Patient taking differently: Take 1 tablet by mouth daily. One by mouth daily at bedtime (for cholesterol) 07/08/16  Yes Arnetha Courser, MD  zolpidem (AMBIEN) 10 MG tablet Take 10 mg by mouth at bedtime as needed.     Yes Historical Provider, MD    Family History  Problem Relation Age of Onset  . Diabetes Mother   .  CVA Mother   . Heart disease Father   . Heart disease Sister   . Heart disease Brother   . Heart disease Sister   . Heart disease Sister   . Heart disease Sister   . Heart disease Brother   . Heart disease Brother   . Heart disease Brother      Social History  Substance Use Topics  . Smoking status: Former Smoker    Packs/day: 1.00    Years: 30.00  . Smokeless tobacco: Never Used  . Alcohol use Yes     Comment: occasionally    Allergies as of 07/29/2016  . (No Known Allergies)    Review of Systems:    All systems reviewed and negative except where noted in HPI.   Physical Exam:  Vital signs in last 24 hours: Temp:  [97.4 F (36.3 C)-98.6 F (37 C)] 97.8 F (36.6 C) (04/18 0746) Pulse Rate:  [61-79] 75 (04/18  0746) Resp:  [17-23] 18 (04/18 0746) BP: (143-180)/(47-107) 149/81 (04/18 0746) SpO2:  [94 %-100 %] 95 % (04/18 0746) Weight:  [200 lb (90.7 kg)-201 lb 11.5 oz (91.5 kg)] 201 lb 11.5 oz (91.5 kg) (04/17 1624) Last BM Date: 07/29/16 General:   Pleasant, cooperative in NAD Head:  Normocephalic and atraumatic. Eyes:   No icterus.   Conjunctiva pink. PERRLA. Ears:  Normal auditory acuity. Neck:  Supple; no masses or thyroidomegaly Lungs: Respirations even and unlabored. Lungs clear to auscultation bilaterally.   No wheezes, crackles, or rhonchi.  Heart:  Regular rate and rhythm;  Without murmur, clicks, rubs or gallops Abdomen:  Soft, nondistended, nontender. Normal bowel sounds. No appreciable masses or hepatomegaly.  No rebound or guarding.   Extremities:  Without edema, cyanosis or clubbing. Neurologic:  Alert and oriented x3;  grossly normal neurologically. Psych:  Alert and cooperative. Normal affect.  LAB RESULTS:  Recent Labs  07/29/16 1401 07/29/16 2056 07/30/16 0435  WBC 7.6  --  10.0  HGB 13.8 13.0 12.8  HCT 40.6  --  37.7  PLT 366  --  339   BMET  Recent Labs  07/29/16 1401 07/30/16 0435  NA 138 137  K 4.2 4.0  CL 105 105  CO2 23 25  GLUCOSE 115* 107*  BUN 22* 15  CREATININE 0.76 0.70  CALCIUM 8.4* 8.3*   LFT  Recent Labs  07/29/16 1401  PROT 7.5  ALBUMIN 3.9  AST 23  ALT 17  ALKPHOS 58  BILITOT 0.5   PT/INR No results for input(s): LABPROT, INR in the last 72 hours.  STUDIES: No results found.    Impression / Plan:   Linda Mejia is a 61 y.o. y/o female with a brief history of hematemesis and melena. H/o esophagitis and a hiatal hernia. Likely coffee ground emesis secondary to long term NSAID related mucosal injury of the upper GI tract .    Plan  1. EGD today 2. PPI  3. H pylori stool antigen  4. Would need to limit use of nsaids   Thank you for involving me in the care of this patient.      LOS: 0 days   Jonathon Bellows, MD   07/30/2016, 7:54 AM

## 2016-07-30 NOTE — Care Management Obs Status (Signed)
[facility] NOTIFICATION   Patient Details  Name: KIMMBERLY WISSER MRN: [id number] Date of Birth: 25-May-1955   Medicare Observation Status Notification Given:  Yes    Marshell Garfinkel, RN 07/30/2016, 9:31 AM

## 2016-07-30 NOTE — Discharge Summary (Signed)
[facility] NAME: Linda Mejia    MR#:  [id number]  DATE OF BIRTH:  [birth year]  DATE OF ADMISSION:  07/29/2016 ADMITTING PHYSICIAN: [name], MD  DATE OF DISCHARGE: 07/30/2016  PRIMARY CARE PHYSICIAN: Enid Derry, MD    ADMISSION DIAGNOSIS:  UGIB (upper gastrointestinal bleed) [K92.2]  DISCHARGE DIAGNOSIS:  Active Problems:   Nausea & vomiting   SECONDARY DIAGNOSIS:   Past Medical History:  Diagnosis Date  . ADHD (attention deficit hyperactivity disorder)   . Anxiety   . Chronic back pain   . Coronary artery disease   . Herpes genitalis in women   . History of stomach ulcers    x7 this year. Bleeding  . Hypertension     HOSPITAL COURSE:  61 year old female with arthritis presented with nausea and vomiting.  1. Gastric ulcers: Patient underwent EGD which showed 3 nonbleeding superficial gastric ulcers with clean base. GI recommendations were for PPI. EGD also shows gastritis and do tinnitus Patient will follow up with GI in 4 weeks.  2. History of CAD: Holding aspirin due to problem #1 continue metoprolol  3. Arthritis: Patient will use because her family physician regarding medications.   DISCHARGE CONDITIONS AND DIET:   Patient stable for discharge on regular diet  CONSULTS OBTAINED:    DRUG ALLERGIES:  No Known Allergies  DISCHARGE MEDICATIONS:   Current Discharge Medication List    START taking these medications   Details  pantoprazole (PROTONIX) 40 MG tablet Take 1 tablet (40 mg total) by mouth daily. Qty: 30 tablet, Refills: 0      CONTINUE these medications which have NOT CHANGED   Details  acyclovir (ZOVIRAX) 400 MG tablet take 1 tablet by mouth twice a day Qty: 60 tablet, Refills: 0    amphetamine-dextroamphetamine (ADDERALL) 30 MG tablet Take 30 mg by mouth 2 (two) times daily.    diazepam (VALIUM) 10 MG tablet Take 1 tablet (10 mg total) by mouth every 8 (eight) hours as needed for  anxiety. Qty: 30 tablet, Refills: 0    fluticasone (FLONASE) 50 MCG/ACT nasal spray Place 2 sprays into both nostrils daily. Qty: 16 g, Refills: 11    metoprolol (LOPRESSOR) 50 MG tablet Take 1 tablet (50 mg total) by mouth 2 (two) times daily. Qty: 60 tablet, Refills: 11   Associated Diagnoses: Coronary artery disease involving native coronary artery of native heart with angina pectoris ([place]); Hypertension goal BP (blood pressure) < 140/90    Pitavastatin Calcium (LIVALO) 2 MG TABS One by mouth daily at bedtime (for cholesterol) Qty: 30 tablet, Refills: 1    zolpidem (AMBIEN) 10 MG tablet Take 10 mg by mouth at bedtime as needed.        STOP taking these medications     aspirin EC 81 MG tablet      ibuprofen (ADVIL,MOTRIN) 200 MG tablet           Today   CHIEF COMPLAINT:  Patient underwent EGD. No acute events overnight   VITAL SIGNS:  Blood pressure (!) 151/83, pulse 74, temperature 97.1 F (36.2 C), temperature source Tympanic, resp. rate 15, height 5\' 5"  (1.651 m), weight 91.5 kg (201 lb 11.5 oz), SpO2 95 %.   REVIEW OF SYSTEMS:  Review of Systems  Constitutional: Negative.  Negative for chills, fever and malaise/fatigue.  HENT: Negative.  Negative for ear discharge, ear pain, hearing loss, nosebleeds and sore throat.   Eyes: Negative.  Negative for blurred vision  and pain.  Respiratory: Negative.  Negative for cough, hemoptysis, shortness of breath and wheezing.   Cardiovascular: Negative.  Negative for chest pain, palpitations and leg swelling.  Gastrointestinal: Negative.  Negative for abdominal pain, blood in stool, diarrhea, nausea and vomiting.  Genitourinary: Negative.  Negative for dysuria.  Musculoskeletal: Negative.  Negative for back pain.  Skin: Negative.   Neurological: Negative for dizziness, tremors, speech change, focal weakness, seizures and headaches.  Endo/Heme/Allergies: Negative.  Does not bruise/bleed easily.  Psychiatric/Behavioral:  Negative.  Negative for depression, hallucinations and suicidal ideas.     PHYSICAL EXAMINATION:  GENERAL:  61 y.o.-year-old patient lying in the bed with no acute distress.  NECK:  Supple, no jugular venous distention. No thyroid enlargement, no tenderness.  LUNGS: Normal breath sounds bilaterally, no wheezing, rales,rhonchi  No use of accessory muscles of respiration.  CARDIOVASCULAR: S1, S2 normal. No murmurs, rubs, or gallops.  ABDOMEN: Soft, non-tender, non-distended. Bowel sounds present. No organomegaly or mass.  EXTREMITIES: No pedal edema, cyanosis, or clubbing.  PSYCHIATRIC: The patient is alert and oriented x 3.  SKIN: No obvious rash, lesion, or ulcer.   DATA REVIEW:   CBC  Recent Labs Lab 07/30/16 0435  WBC 10.0  HGB 12.8  HCT 37.7  PLT 339    Chemistries   Recent Labs Lab 07/29/16 1401 07/30/16 0435  NA 138 137  K 4.2 4.0  CL 105 105  CO2 23 25  GLUCOSE 115* 107*  BUN 22* 15  CREATININE 0.76 0.70  CALCIUM 8.4* 8.3*  AST 23  --   ALT 17  --   ALKPHOS 58  --   BILITOT 0.5  --     Cardiac Enzymes  Recent Labs Lab 07/29/16 1401 07/29/16 2056 07/30/16 0435  TROPONINI <0.03 <0.03 <0.03    Microbiology Results  @MICRORSLT48 @  RADIOLOGY:  No results found.    Current Discharge Medication List    START taking these medications   Details  pantoprazole (PROTONIX) 40 MG tablet Take 1 tablet (40 mg total) by mouth daily. Qty: 30 tablet, Refills: 0      CONTINUE these medications which have NOT CHANGED   Details  acyclovir (ZOVIRAX) 400 MG tablet take 1 tablet by mouth twice a day Qty: 60 tablet, Refills: 0    amphetamine-dextroamphetamine (ADDERALL) 30 MG tablet Take 30 mg by mouth 2 (two) times daily.    diazepam (VALIUM) 10 MG tablet Take 1 tablet (10 mg total) by mouth every 8 (eight) hours as needed for anxiety. Qty: 30 tablet, Refills: 0    fluticasone (FLONASE) 50 MCG/ACT nasal spray Place 2 sprays into both nostrils  daily. Qty: 16 g, Refills: 11    metoprolol (LOPRESSOR) 50 MG tablet Take 1 tablet (50 mg total) by mouth 2 (two) times daily. Qty: 60 tablet, Refills: 11   Associated Diagnoses: Coronary artery disease involving native coronary artery of native heart with angina pectoris ([place]); Hypertension goal BP (blood pressure) < 140/90    Pitavastatin Calcium (LIVALO) 2 MG TABS One by mouth daily at bedtime (for cholesterol) Qty: 30 tablet, Refills: 1    zolpidem (AMBIEN) 10 MG tablet Take 10 mg by mouth at bedtime as needed.        STOP taking these medications     aspirin EC 81 MG tablet      ibuprofen (ADVIL,MOTRIN) 200 MG tablet           Management plans discussed with the patient and she is in agreement. Stable  for discharge home  Patient should follow up with pcp  CODE STATUS:     Code Status Orders        Start     Ordered   07/29/16 1533  Full code  Continuous     07/29/16 1533    Code Status History    Date Active Date Inactive Code Status Order ID Comments User Context   09/21/2015  2:29 AM 09/22/2015  2:18 PM Full Code [id number]  Saundra Shelling, MD Inpatient      TOTAL TIME TAKING CARE OF THIS PATIENT: 37 minutes.    Note: This dictation was prepared with Dragon dictation along with smaller phrase technology. Any transcriptional errors that result from this process are unintentional.  Artavia Jeanlouis M.D on 07/30/2016 at 1:32 PM  Between 7am to 6pm - Pager - [phone] After 6pm go to [email or web address] - password [email or web address] [facility]  Office  [phone]  CC: Primary care physician; Enid Derry, MD

## 2016-07-31 ENCOUNTER — Encounter: Payer: Self-pay | Admitting: Gastroenterology

## 2016-07-31 LAB — SURGICAL PATHOLOGY

## 2016-08-01 ENCOUNTER — Telehealth: Payer: Self-pay | Admitting: Family Medicine

## 2016-08-01 ENCOUNTER — Other Ambulatory Visit: Payer: Self-pay

## 2016-08-01 ENCOUNTER — Telehealth: Payer: Self-pay

## 2016-08-01 DIAGNOSIS — K2970 Gastritis, unspecified, without bleeding: Principal | ICD-10-CM

## 2016-08-01 DIAGNOSIS — B9681 Helicobacter pylori [H. pylori] as the cause of diseases classified elsewhere: Secondary | ICD-10-CM

## 2016-08-01 MED ORDER — AMOXICILLIN 500 MG PO CAPS: 1000.0000 mg | ORAL_CAPSULE | Freq: Three times a day (TID) | ORAL | 0 refills | Status: DC

## 2016-08-01 MED ORDER — CLARITHROMYCIN 250 MG PO TABS: 500.0000 mg | ORAL_TABLET | Freq: Two times a day (BID) | ORAL | 0 refills | Status: AC

## 2016-08-01 MED ORDER — OMEPRAZOLE 20 MG PO CPDR: 20.0000 mg | DELAYED_RELEASE_CAPSULE | Freq: Two times a day (BID) | ORAL | 0 refills | Status: DC

## 2016-08-01 NOTE — Telephone Encounter (Signed)
Tylenol per package directions is what is recommended for patients who cannot take NSAIDs Always follow the instructions on the package If pain worsens or worrisome symptoms develop, go back to the ER

## 2016-08-01 NOTE — Telephone Encounter (Signed)
Spoke with patient about H. Pylori gastritis, per Dr. Vicente Males.   Advised medication prescriptions with instructions.   Pt was concerned about arthritis. Advised pt to contact PCP Nurse concerning medications.  Pt stated no penicillin allergy

## 2016-08-01 NOTE — Telephone Encounter (Signed)
-----   Message from Jonathon Bellows, MD sent at 08/01/2016  9:37 AM EDT ----- H pylori gastritis.   Suggest clarithromycin 500 mg PO BID, amoxicillin 1 gram TID, omeprazole 20 mg BID all for 14 days.  , check for penicillin allergy and will need repeat H pylori stool antigen to check for eradication after .

## 2016-08-01 NOTE — Telephone Encounter (Signed)
Left detail voice mail

## 2016-08-01 NOTE — Telephone Encounter (Signed)
Pt said that she has been diag with H-pylori and she needs to know what she can take for her pain. She usually takes ibuprofen but they say she cannot take that now. She is in a lot of pain. Please advise what to take.

## 2016-08-06 ENCOUNTER — Ambulatory Visit: Payer: Medicare Other | Admitting: Family Medicine

## 2016-08-08 ENCOUNTER — Ambulatory Visit: Payer: Medicare Other | Admitting: Family Medicine

## 2016-08-11 ENCOUNTER — Encounter: Payer: Self-pay | Admitting: Emergency Medicine

## 2016-08-11 ENCOUNTER — Emergency Department: Payer: Medicare Other

## 2016-08-11 ENCOUNTER — Observation Stay
Admission: EM | Admit: 2016-08-11 | Discharge: 2016-08-12 | Disposition: A | Discharge status: Home / Self Care | Payer: Medicare Other | Attending: Internal Medicine | Admitting: Internal Medicine

## 2016-08-11 DIAGNOSIS — Z8249 Family history of ischemic heart disease and other diseases of the circulatory system: Secondary | ICD-10-CM | POA: Insufficient documentation

## 2016-08-11 DIAGNOSIS — K210 Gastro-esophageal reflux disease with esophagitis: Secondary | ICD-10-CM | POA: Diagnosis not present

## 2016-08-11 DIAGNOSIS — R0789 Other chest pain: Secondary | ICD-10-CM | POA: Diagnosis not present

## 2016-08-11 DIAGNOSIS — F909 Attention-deficit hyperactivity disorder, unspecified type: Secondary | ICD-10-CM | POA: Diagnosis not present

## 2016-08-11 DIAGNOSIS — M170 Bilateral primary osteoarthritis of knee: Secondary | ICD-10-CM | POA: Insufficient documentation

## 2016-08-11 DIAGNOSIS — R079 Chest pain, unspecified: Secondary | ICD-10-CM | POA: Diagnosis present

## 2016-08-11 DIAGNOSIS — B9681 Helicobacter pylori [H. pylori] as the cause of diseases classified elsewhere: Secondary | ICD-10-CM

## 2016-08-11 DIAGNOSIS — K3189 Other diseases of stomach and duodenum: Secondary | ICD-10-CM | POA: Insufficient documentation

## 2016-08-11 DIAGNOSIS — Z87891 Personal history of nicotine dependence: Secondary | ICD-10-CM | POA: Insufficient documentation

## 2016-08-11 DIAGNOSIS — F410 Panic disorder [episodic paroxysmal anxiety] without agoraphobia: Secondary | ICD-10-CM | POA: Insufficient documentation

## 2016-08-11 DIAGNOSIS — I249 Acute ischemic heart disease, unspecified: Secondary | ICD-10-CM | POA: Diagnosis not present

## 2016-08-11 DIAGNOSIS — Z6833 Body mass index (BMI) 33.0-33.9, adult: Secondary | ICD-10-CM | POA: Insufficient documentation

## 2016-08-11 DIAGNOSIS — M79662 Pain in left lower leg: Secondary | ICD-10-CM | POA: Diagnosis not present

## 2016-08-11 DIAGNOSIS — E785 Hyperlipidemia, unspecified: Secondary | ICD-10-CM | POA: Diagnosis not present

## 2016-08-11 DIAGNOSIS — Z7982 Long term (current) use of aspirin: Secondary | ICD-10-CM | POA: Insufficient documentation

## 2016-08-11 DIAGNOSIS — Z955 Presence of coronary angioplasty implant and graft: Secondary | ICD-10-CM | POA: Diagnosis not present

## 2016-08-11 DIAGNOSIS — I2510 Atherosclerotic heart disease of native coronary artery without angina pectoris: Secondary | ICD-10-CM | POA: Diagnosis not present

## 2016-08-11 DIAGNOSIS — Z8719 Personal history of other diseases of the digestive system: Secondary | ICD-10-CM | POA: Insufficient documentation

## 2016-08-11 DIAGNOSIS — Z8619 Personal history of other infectious and parasitic diseases: Secondary | ICD-10-CM | POA: Insufficient documentation

## 2016-08-11 DIAGNOSIS — M79661 Pain in right lower leg: Secondary | ICD-10-CM | POA: Diagnosis not present

## 2016-08-11 DIAGNOSIS — E039 Hypothyroidism, unspecified: Secondary | ICD-10-CM | POA: Insufficient documentation

## 2016-08-11 DIAGNOSIS — B009 Herpesviral infection, unspecified: Secondary | ICD-10-CM | POA: Diagnosis not present

## 2016-08-11 DIAGNOSIS — R42 Dizziness and giddiness: Secondary | ICD-10-CM

## 2016-08-11 DIAGNOSIS — Z79899 Other long term (current) drug therapy: Secondary | ICD-10-CM | POA: Insufficient documentation

## 2016-08-11 DIAGNOSIS — J309 Allergic rhinitis, unspecified: Secondary | ICD-10-CM | POA: Diagnosis not present

## 2016-08-11 DIAGNOSIS — E669 Obesity, unspecified: Secondary | ICD-10-CM | POA: Insufficient documentation

## 2016-08-11 DIAGNOSIS — R0602 Shortness of breath: Secondary | ICD-10-CM | POA: Diagnosis not present

## 2016-08-11 DIAGNOSIS — I10 Essential (primary) hypertension: Secondary | ICD-10-CM | POA: Diagnosis not present

## 2016-08-11 DIAGNOSIS — K2970 Gastritis, unspecified, without bleeding: Secondary | ICD-10-CM

## 2016-08-11 DIAGNOSIS — F419 Anxiety disorder, unspecified: Secondary | ICD-10-CM | POA: Diagnosis not present

## 2016-08-11 DIAGNOSIS — G8929 Other chronic pain: Secondary | ICD-10-CM | POA: Diagnosis not present

## 2016-08-11 DIAGNOSIS — Z833 Family history of diabetes mellitus: Secondary | ICD-10-CM | POA: Insufficient documentation

## 2016-08-11 DIAGNOSIS — Z823 Family history of stroke: Secondary | ICD-10-CM | POA: Insufficient documentation

## 2016-08-11 LAB — CBC
HCT: 42.5 % (ref 35.0–47.0)
Hemoglobin: 14.1 g/dL (ref 12.0–16.0)
MCH: 32.4 pg (ref 26.0–34.0)
MCHC: 33.2 g/dL (ref 32.0–36.0)
MCV: 97.5 fL (ref 80.0–100.0)
Platelets: 397 10*3/uL (ref 150–440)
RBC: 4.35 MIL/uL (ref 3.80–5.20)
RDW: 14.3 % (ref 11.5–14.5)
WBC: 8.6 10*3/uL (ref 3.6–11.0)

## 2016-08-11 LAB — BASIC METABOLIC PANEL
Anion gap: 9 (ref 5–15)
Anion gap: 11 (ref 5–15)
BUN: 13 mg/dL (ref 6–20)
BUN: 13 mg/dL (ref 6–20)
CO2: 27 mmol/L (ref 22–32)
CO2: 30 mmol/L (ref 22–32)
Calcium: 8.6 mg/dL — ABNORMAL LOW (ref 8.9–10.3)
Calcium: 9.2 mg/dL (ref 8.9–10.3)
Chloride: 102 mmol/L (ref 101–111)
Chloride: 99 mmol/L — ABNORMAL LOW (ref 101–111)
Creatinine, Ser: 0.83 mg/dL (ref 0.44–1.00)
Creatinine, Ser: 1.11 mg/dL — ABNORMAL HIGH (ref 0.44–1.00)
GFR calc Af Amer: >60 mL/min (ref >60)
GFR calc Af Amer: >60 mL/min (ref >60)
GFR calc non Af Amer: >60 mL/min (ref >60)
GFR calc non Af Amer: 53 mL/min — ABNORMAL LOW (ref >60)
Glucose, Bld: 103 mg/dL — ABNORMAL HIGH (ref 65–99)
Glucose, Bld: 146 mg/dL — ABNORMAL HIGH (ref 65–99)
Potassium: 4.3 mmol/L (ref 3.5–5.1)
Potassium: 4.1 mmol/L (ref 3.5–5.1)
Sodium: 138 mmol/L (ref 135–145)
Sodium: 140 mmol/L (ref 135–145)

## 2016-08-11 LAB — CBC WITH DIFFERENTIAL/PLATELET
Basophils Absolute: 0.1 10*3/uL (ref 0–0.1)
Basophils Relative: 1 %
Eosinophils Absolute: 0.1 10*3/uL (ref 0–0.7)
Eosinophils Relative: 1 %
HCT: 38.5 % (ref 35.0–47.0)
Hemoglobin: 13 g/dL (ref 12.0–16.0)
Lymphocytes Relative: 33 %
Lymphs Abs: 2.6 10*3/uL (ref 1.0–3.6)
MCH: 32 pg (ref 26.0–34.0)
MCHC: 33.9 g/dL (ref 32.0–36.0)
MCV: 94.4 fL (ref 80.0–100.0)
Monocytes Absolute: 0.9 10*3/uL (ref 0.2–0.9)
Monocytes Relative: 11 %
Neutro Abs: 4.2 10*3/uL (ref 1.4–6.5)
Neutrophils Relative %: 54 %
Platelets: 369 10*3/uL (ref 150–440)
RBC: 4.07 MIL/uL (ref 3.80–5.20)
RDW: 14.1 % (ref 11.5–14.5)
WBC: 7.9 10*3/uL (ref 3.6–11.0)

## 2016-08-11 LAB — CK: Total CK: 118 U/L (ref 38–234)

## 2016-08-11 LAB — TSH: TSH: 3.541 u[IU]/mL (ref 0.350–4.500)

## 2016-08-11 LAB — TROPONIN I
Troponin I: <0.03 ng/mL (ref <0.03)
Troponin I: <0.03 ng/mL (ref <0.03)
Troponin I: <0.03 ng/mL (ref <0.03)

## 2016-08-11 LAB — BRAIN NATRIURETIC PEPTIDE: B Natriuretic Peptide: 63 pg/mL (ref 0.0–100.0)

## 2016-08-11 LAB — MAGNESIUM: Magnesium: 2.2 mg/dL (ref 1.7–2.4)

## 2016-08-11 LAB — PROTIME-INR
INR: 0.92
Prothrombin Time: 12.3 seconds (ref 11.4–15.2)

## 2016-08-11 MED ORDER — NITROGLYCERIN 0.4 MG SL SUBL
SUBLINGUAL_TABLET | SUBLINGUAL | Status: AC
  Filled 2016-08-11: qty 1

## 2016-08-11 MED ORDER — AMOXICILLIN 500 MG PO CAPS
1000.0000 mg | ORAL_CAPSULE | Freq: Three times a day (TID) | ORAL | Status: DC
  Administered 2016-08-11: 1000 mg via ORAL
  Filled 2016-08-11: qty 2

## 2016-08-11 MED ORDER — TRAMADOL HCL 50 MG PO TABS
50.0000 mg | ORAL_TABLET | Freq: Four times a day (QID) | ORAL | Status: DC | PRN
  Administered 2016-08-11: 50 mg via ORAL
  Filled 2016-08-11: qty 1

## 2016-08-11 MED ORDER — ASPIRIN 81 MG PO CHEW
81.0000 mg | CHEWABLE_TABLET | ORAL | Status: AC
  Administered 2016-08-12: 81 mg via ORAL
  Filled 2016-08-11: qty 1

## 2016-08-11 MED ORDER — LISINOPRIL 5 MG PO TABS
ORAL_TABLET | ORAL | Status: AC
  Administered 2016-08-11: 5 mg via ORAL
  Filled 2016-08-11: qty 1

## 2016-08-11 MED ORDER — DOCUSATE SODIUM 100 MG PO CAPS
100.0000 mg | ORAL_CAPSULE | Freq: Two times a day (BID) | ORAL | Status: DC
  Administered 2016-08-11: 100 mg via ORAL
  Filled 2016-08-11 (×2): qty 1

## 2016-08-11 MED ORDER — LISINOPRIL 5 MG PO TABS
5.0000 mg | ORAL_TABLET | Freq: Every day | ORAL | Status: DC
  Administered 2016-08-11 – 2016-08-12 (×2): 5 mg via ORAL
  Filled 2016-08-11: qty 1

## 2016-08-11 MED ORDER — ONDANSETRON HCL 4 MG/2ML IJ SOLN
4.0000 mg | Freq: Four times a day (QID) | INTRAMUSCULAR | Status: DC | PRN
  Administered 2016-08-12: 4 mg via INTRAVENOUS
  Filled 2016-08-11: qty 2

## 2016-08-11 MED ORDER — METOPROLOL TARTRATE 50 MG PO TABS
50.0000 mg | ORAL_TABLET | Freq: Two times a day (BID) | ORAL | Status: DC
  Administered 2016-08-11 – 2016-08-12 (×2): 50 mg via ORAL
  Filled 2016-08-11 (×2): qty 1

## 2016-08-11 MED ORDER — PRAVASTATIN SODIUM 40 MG PO TABS
40.0000 mg | ORAL_TABLET | Freq: Every day | ORAL | Status: DC
  Administered 2016-08-11: 40 mg via ORAL
  Filled 2016-08-11: qty 1

## 2016-08-11 MED ORDER — CLARITHROMYCIN 500 MG PO TABS
500.0000 mg | ORAL_TABLET | Freq: Two times a day (BID) | ORAL | Status: DC
Start: 2016-08-11 — End: 2016-08-12
  Administered 2016-08-11 – 2016-08-12 (×2): 500 mg via ORAL
  Filled 2016-08-11 (×2): qty 1

## 2016-08-11 MED ORDER — ONDANSETRON HCL 4 MG/2ML IJ SOLN
4.0000 mg | Freq: Once | INTRAMUSCULAR | Status: AC
  Administered 2016-08-11: 4 mg via INTRAVENOUS
  Filled 2016-08-11: qty 2

## 2016-08-11 MED ORDER — MORPHINE SULFATE (PF) 4 MG/ML IV SOLN
4.0000 mg | INTRAVENOUS | Status: DC | PRN
  Administered 2016-08-11: 4 mg via INTRAVENOUS
  Filled 2016-08-11: qty 1

## 2016-08-11 MED ORDER — ENOXAPARIN SODIUM 40 MG/0.4ML ~~LOC~~ SOLN
40.0000 mg | SUBCUTANEOUS | Status: DC
  Administered 2016-08-11: 40 mg via SUBCUTANEOUS
  Filled 2016-08-11: qty 0.4

## 2016-08-11 MED ORDER — NITROGLYCERIN 0.4 MG SL SUBL: 0.4000 mg | SUBLINGUAL_TABLET | SUBLINGUAL | Status: DC | PRN

## 2016-08-11 MED ORDER — ONDANSETRON HCL 4 MG PO TABS: 4.0000 mg | ORAL_TABLET | Freq: Four times a day (QID) | ORAL | Status: DC | PRN

## 2016-08-11 MED ORDER — ACYCLOVIR 200 MG PO CAPS
400.0000 mg | ORAL_CAPSULE | Freq: Two times a day (BID) | ORAL | Status: DC
  Administered 2016-08-11 – 2016-08-12 (×2): 400 mg via ORAL
  Filled 2016-08-11 (×2): qty 2

## 2016-08-11 MED ORDER — SODIUM CHLORIDE 0.9 % WEIGHT BASED INFUSION: 3.0000 mL/kg/h | INTRAVENOUS | Status: AC

## 2016-08-11 MED ORDER — SODIUM CHLORIDE 0.9% FLUSH
3.0000 mL | Freq: Two times a day (BID) | INTRAVENOUS | Status: DC
  Administered 2016-08-11: 3 mL via INTRAVENOUS

## 2016-08-11 MED ORDER — ASPIRIN EC 81 MG PO TBEC
81.0000 mg | DELAYED_RELEASE_TABLET | Freq: Every day | ORAL | Status: DC
  Administered 2016-08-11: 81 mg via ORAL
  Filled 2016-08-11 (×2): qty 1

## 2016-08-11 MED ORDER — SODIUM CHLORIDE 0.9 % WEIGHT BASED INFUSION
1.0000 mL/kg/h | INTRAVENOUS | Status: DC
  Administered 2016-08-12: 1 mL/kg/h via INTRAVENOUS

## 2016-08-11 MED ORDER — ACETAMINOPHEN 325 MG PO TABS: 650.0000 mg | ORAL_TABLET | Freq: Four times a day (QID) | ORAL | Status: DC | PRN

## 2016-08-11 MED ORDER — MORPHINE SULFATE (PF) 4 MG/ML IV SOLN
4.0000 mg | Freq: Once | INTRAVENOUS | Status: AC
  Administered 2016-08-11: 4 mg via INTRAVENOUS
  Filled 2016-08-11: qty 1

## 2016-08-11 MED ORDER — AMPHETAMINE-DEXTROAMPHETAMINE 5 MG PO TABS
30.0000 mg | ORAL_TABLET | Freq: Two times a day (BID) | ORAL | Status: DC
  Administered 2016-08-11 – 2016-08-12 (×2): 30 mg via ORAL
  Filled 2016-08-11 (×2): qty 6

## 2016-08-11 MED ORDER — PANTOPRAZOLE SODIUM 40 MG PO TBEC
40.0000 mg | DELAYED_RELEASE_TABLET | Freq: Every day | ORAL | Status: DC
  Administered 2016-08-11: 40 mg via ORAL
  Filled 2016-08-11: qty 1

## 2016-08-11 MED ORDER — NITROGLYCERIN 2 % TD OINT
1.0000 [in_us] | TOPICAL_OINTMENT | Freq: Four times a day (QID) | TRANSDERMAL | Status: DC
  Filled 2016-08-11: qty 1

## 2016-08-11 MED ORDER — NITROGLYCERIN 0.4 MG/SPRAY TL SOLN: 1.0000 | Status: DC | PRN

## 2016-08-11 MED ORDER — SODIUM CHLORIDE 0.9% FLUSH
3.0000 mL | INTRAVENOUS | Status: DC | PRN
  Administered 2016-08-12 (×2): 3 mL via INTRAVENOUS
  Filled 2016-08-11 (×2): qty 3

## 2016-08-11 MED ORDER — ZOLPIDEM TARTRATE 5 MG PO TABS: 5.0000 mg | ORAL_TABLET | Freq: Every evening | ORAL | Status: DC | PRN

## 2016-08-11 MED ORDER — ACETAMINOPHEN 650 MG RE SUPP: 650.0000 mg | Freq: Four times a day (QID) | RECTAL | Status: DC | PRN

## 2016-08-11 MED ORDER — SODIUM CHLORIDE 0.9 % IV SOLN: 250.0000 mL | INTRAVENOUS | Status: DC | PRN

## 2016-08-11 MED ORDER — DIAZEPAM 5 MG PO TABS: 10.0000 mg | ORAL_TABLET | Freq: Three times a day (TID) | ORAL | Status: DC | PRN

## 2016-08-11 MED ORDER — FLUTICASONE PROPIONATE 50 MCG/ACT NA SUSP
2.0000 | Freq: Every day | NASAL | Status: DC | PRN
  Filled 2016-08-11: qty 16

## 2016-08-11 NOTE — ED Triage Notes (Signed)
Pt in via EMS from home with complaints of intermittent left chest pain with radiation to left arm and back since yesterday; pt reports associated dizziness and nausea, denies any vomiting.  Pt with hx of MI and stent placement.  Pt took baby aspirin at home, refused Nitro per EMS.  Pt A/Ox4, NAD noted at this time.

## 2016-08-11 NOTE — ED Provider Notes (Signed)
[facility] Emergency Department Provider Note  ____________________________________________   First MD Initiated Contact with Patient 08/11/16 1249     (approximate)  I have reviewed the triage vital signs and the nursing notes.   HISTORY  Chief Complaint Chest Pain   HPI Linda Mejia is a 61 y.o. female with a history of anxietyas well as coronary artery disease with stenting 22 years ago who is presenting to emergency department with left-sided chest pain which is been intermittent over the past week. She says that the pain right now as a 5 out of 10 and feels like a squeezing pain underneath her left breast. She says it radiates through to her back. She denies any shortness of breath reports nausea. Denies any diaphoresis. No worsening with exertion. Says it feels similar to her chest pain in the past when she has required stents. Recent admission to the hospital for upper GI bleed with renal bleeding ulcers found in the stomach. Patient denies any blood in her stool since discharge. No vomiting today. Says that she took 81 mg enteric-coated aspirin this morning prior to arrival. Refused aspirin en route because of her bleeding ulcers in the past. Also does not take nitroglycerin secondary to headaches.   Past Medical History:  Diagnosis Date  . ADHD (attention deficit hyperactivity disorder)   . Anxiety   . Chronic back pain   . Coronary artery disease   . Herpes genitalis in women   . History of stomach ulcers    x7 this year. Bleeding  . Hypertension     Patient Active Problem List   Diagnosis Date Noted  . Nausea & vomiting 07/29/2016  . Hx of tobacco use, presenting hazards to health 06/09/2016  . Encounter for HIV (human immunodeficiency virus) test 06/09/2016  . Preventative health care 06/09/2016  . Screen for STD (sexually transmitted disease) 06/09/2016  . Degenerative arthritis of knee, bilateral 12/27/2015  . Obesity 12/27/2015  . GI  bleed 09/21/2015  . Reflux esophagitis   . Other diseases of stomach and duodenum   . Duodenitis   . Subclinical hypothyroidism 06/06/2015  . Multiple joint pain 06/05/2015  . Other fatigue 06/05/2015  . Night sweats 06/05/2015  . Screening for tuberculosis 06/05/2015  . Headache 04/12/2015  . Subclinical hypothyroidism 03/26/2015  . Herpes simplex type 2 infection 03/21/2015  . Rhinitis, allergic 03/21/2015  . Cervical pain 09/21/2014  . ADD (attention deficit hyperactivity disorder, inattentive type) 09/20/2014  . Episodic paroxysmal anxiety disorder 09/20/2014  . History of gastric ulcer 09/20/2014  . Abuse, drug or alcohol 09/20/2014  . Breathlessness on exertion 08/28/2014  . HLD (hyperlipidemia) 10/03/2009  . Coronary artery disease involving native coronary artery of native heart with angina pectoris ([place]) 10/02/2009  . Hypertension goal BP (blood pressure) < 140/90 10/02/2009    Past Surgical History:  Procedure Laterality Date  . CARDIAC CATHETERIZATION    . CESAREAN SECTION    . ESOPHAGOGASTRODUODENOSCOPY (EGD) WITH PROPOFOL N/A 09/21/2015   Procedure: ESOPHAGOGASTRODUODENOSCOPY (EGD) WITH PROPOFOL;  Surgeon: Lucilla Lame, MD;  Location: [facility] ENDOSCOPY;  Service: Endoscopy;  Laterality: N/A;  . ESOPHAGOGASTRODUODENOSCOPY (EGD) WITH PROPOFOL N/A 07/30/2016   Procedure: ESOPHAGOGASTRODUODENOSCOPY (EGD) WITH PROPOFOL;  Surgeon: Jonathon Bellows, MD;  Location: [facility] ENDOSCOPY;  Service: Endoscopy;  Laterality: N/A;  . TONSILLECTOMY    . TUBAL LIGATION      Prior to Admission medications   Medication Sig Start Date End Date Taking? Authorizing Provider  acyclovir (ZOVIRAX) 400 MG tablet  take 1 tablet by mouth twice a day 05/19/16  Yes Arnetha Courser, MD  amoxicillin (AMOXIL) 500 MG capsule Take 2 capsules (1,000 mg total) by mouth 3 (three) times daily. 08/01/16 08/15/16 Yes Jonathon Bellows, MD  amphetamine-dextroamphetamine (ADDERALL) 30 MG tablet Take 30 mg by mouth 2 (two) times daily.    Yes Historical Provider, MD  aspirin EC 81 MG tablet Take 81 mg by mouth daily.   Yes Historical Provider, MD  clarithromycin (BIAXIN) 250 MG tablet Take 2 tablets (500 mg total) by mouth 2 (two) times daily. 08/01/16 08/15/16 Yes Jonathon Bellows, MD  diazepam (VALIUM) 10 MG tablet Take 1 tablet (10 mg total) by mouth every 8 (eight) hours as needed for anxiety. 09/22/15  Yes Fritzi Mandes, MD  fluticasone (FLONASE) 50 MCG/ACT nasal spray Place 2 sprays into both nostrils daily. 06/09/16  Yes Arnetha Courser, MD  metoprolol (LOPRESSOR) 50 MG tablet Take 1 tablet (50 mg total) by mouth 2 (two) times daily. 06/09/16  Yes Arnetha Courser, MD  omeprazole (PRILOSEC) 20 MG capsule Take 1 capsule (20 mg total) by mouth 2 (two) times daily before a meal. 08/01/16 08/15/16 Yes Jonathon Bellows, MD  pantoprazole (PROTONIX) 40 MG tablet Take 1 tablet (40 mg total) by mouth daily. 07/30/16  Yes Bettey Costa, MD  Pitavastatin Calcium (LIVALO) 2 MG TABS One by mouth daily at bedtime (for cholesterol) Patient taking differently: Take 1 tablet by mouth daily. One by mouth daily at bedtime (for cholesterol) 07/08/16  Yes Arnetha Courser, MD  zolpidem (AMBIEN) 10 MG tablet Take 10 mg by mouth at bedtime as needed.     Yes Historical Provider, MD  [place] 5-2.5-18.5 injection  06/09/16   Historical Provider, MD    Allergies Patient has no known allergies.  Family History  Problem Relation Age of Onset  . Diabetes Mother   . CVA Mother   . Heart disease Father   . Heart disease Sister   . Heart disease Brother   . Heart disease Sister   . Heart disease Sister   . Heart disease Sister   . Heart disease Brother   . Heart disease Brother   . Heart disease Brother     Social History Social History  Substance Use Topics  . Smoking status: Former Smoker    Packs/day: 1.00    Years: 30.00  . Smokeless tobacco: Never Used  . Alcohol use Yes     Comment: occasionally    Review of Systems  Constitutional: No fever/chills Eyes: No  visual changes. ENT: No sore throat. Cardiovascular: as above Respiratory: Denies shortness of breath. Gastrointestinal: No abdominal pain.  No nausea, no vomiting.  No diarrhea.  No constipation. Genitourinary: Negative for dysuria. Musculoskeletal: Negative for back pain. Skin: Negative for rash. Neurological: Negative for headaches, focal weakness or numbness.   ____________________________________________   PHYSICAL EXAM:  VITAL SIGNS: ED Triage Vitals  Enc Vitals Group     BP 08/11/16 1250 (!) 156/87     Pulse Rate 08/11/16 1250 67     Resp 08/11/16 1250 12     Temp 08/11/16 1250 98.6 F (37 C)     Temp Source 08/11/16 1250 Oral     SpO2 08/11/16 1250 96 %     Weight 08/11/16 1245 200 lb (90.7 kg)     Height 08/11/16 1245 5\' 5"  (1.651 m)     Head Circumference --      Peak Flow --  Pain Score 08/11/16 1244 6     Pain Loc --      Pain Edu? --      Excl. in [place]? --     Constitutional: Alert and oriented. Well appearing and in no acute distress. Eyes: Conjunctivae are normal. PERRL. EOMI. Head: Atraumatic. Nose: No congestion/rhinnorhea. Mouth/Throat: Mucous membranes are moist.   Neck: No stridor.   Cardiovascular: Normal rate, regular rhythm. Grossly normal heart sounds.  Chest pain is not reproducible to palpation. Respiratory: Normal respiratory effort.  No retractions. Lungs CTAB. Gastrointestinal: Soft and nontender. No distention.  Musculoskeletal: No lower extremity tenderness nor edema.  No joint effusions. Neurologic:  Normal speech and language. No gross focal neurologic deficits are appreciated. Skin:  Skin is warm, dry and intact. No rash noted. Psychiatric: Mood and affect are normal. Speech and behavior are normal.  ____________________________________________   LABS (all labs ordered are listed, but only abnormal results are displayed)  Labs Reviewed  BASIC METABOLIC PANEL - Abnormal; Notable for the following:       Result Value    Glucose, Bld 103 (*)    Calcium 8.6 (*)    All other components within normal limits  CBC WITH DIFFERENTIAL/PLATELET  TROPONIN I  TROPONIN I   ____________________________________________  EKG  ED ECG REPORT I, Doran Stabler, the attending physician, personally viewed and interpreted this ECG.   Date: 08/11/2016  EKG Time: 1250  Rate: 63  Rhythm: normal sinus rhythm. EKG machine read as atrial fibrillation however the P waves are easily seen in lead V3 as well as a rhythm strip, lead 2.  Axis: Normal  Intervals:none  ST&T Change: No ST segment elevation or depression. No abnormal T-wave inversion.  ____________________________________________  [id number]  DG Chest 1 View (Final result)  Result time 08/11/16 13:19:40  Final result by Gilford Silvius, MD (08/11/16 13:19:40)           Narrative:   CLINICAL DATA: Chest pain and shortness of breath since yesterday  EXAM: CHEST 1 VIEW  COMPARISON: 11/28/2015  FINDINGS: Resolved right-sided airspace disease. There is no edema, consolidation, effusion, or pneumothorax. Normal heart size and mediastinal contours. Coronary stent noted.  IMPRESSION: No evidence of active disease.   Electronically Signed By: Monte Fantasia M.D. On: 08/11/2016 13:19            ____________________________________________   PROCEDURES  Procedure(s) performed:   Procedures  Critical Care performed:   ____________________________________________   INITIAL IMPRESSION / ASSESSMENT AND PLAN / ED COURSE  Pertinent labs & imaging results that were available during my care of the patient were reviewed by me and considered in my medical decision making (see chart for details).   ----------------------------------------- 2:47 PM on 08/11/2016 -----------------------------------------  Patient evaluated by Dr.Khan of cardiology who recommends the patient admitted for likely catheterization. She had normal lab  tests as well as EKGs in the past with CAD that required a stent. He is concerned about her chest pain enough to admit her for the catheterization during this visit. The patient is aware of the plan and willing to comply. Signed out to Dr. Bridgett Larsson of the hospitalist service. Patient without any signs of distress at this time but does report that she is still having consistent chest pain despite morphine.     ____________________________________________   FINAL CLINICAL IMPRESSION(S) / ED DIAGNOSES  Chest pain.    NEW MEDICATIONS STARTED DURING THIS VISIT:  New Prescriptions   No medications on file  Note:  This document was prepared using Dragon voice recognition software and may include unintentional dictation errors.    Orbie Pyo, MD 08/11/16 [phone]

## 2016-08-11 NOTE — H&P (Addendum)
[facility] NAME: Linda Mejia    MR#:  [id number]  DATE OF BIRTH:  Apr 01, 1956  DATE OF ADMISSION:  08/11/2016  PRIMARY CARE PHYSICIAN: Enid Derry, MD   REQUESTING/REFERRING PHYSICIAN:   CHIEF COMPLAINT:   Chief Complaint  Patient presents with  . Chest Pain    HISTORY OF PRESENT ILLNESS: Linda Mejia  is a 61 y.o. female with a known history of Recent admission in the middle of April for nausea, vomiting, suspected hematemesis, status post EGD revealing gastric ulcers with clean base, coronary artery disease, who presents to the hospital with complaints of midsternal chest pain. According to the patient, she started having midsternal to the left side. The chest pain yesterday while she was standing with a dock outside of the house. Patient's pain was described as squeezing 10 other than by intensity, intermittent pain, initially lasting around 10 minutes. Worsening with a walking, improving whenever she laid down. Patient broke out in sweat, she felt her legs/Square painful. She felt somewhat dizzy. She decided to come to emergency room for further evaluation and treatment, however, she was seen by cardiologist and cardiac catheterization was recommended and admission.   PAST MEDICAL HISTORY:   Past Medical History:  Diagnosis Date  . ADHD (attention deficit hyperactivity disorder)   . Anxiety   . Chronic back pain   . Coronary artery disease   . Herpes genitalis in women   . History of stomach ulcers    x7 this year. Bleeding  . Hypertension     PAST SURGICAL HISTORY: Past Surgical History:  Procedure Laterality Date  . CARDIAC CATHETERIZATION    . CESAREAN SECTION    . ESOPHAGOGASTRODUODENOSCOPY (EGD) WITH PROPOFOL N/A 09/21/2015   Procedure: ESOPHAGOGASTRODUODENOSCOPY (EGD) WITH PROPOFOL;  Surgeon: Lucilla Lame, MD;  Location: [facility] ENDOSCOPY;  Service: Endoscopy;  Laterality: N/A;  . ESOPHAGOGASTRODUODENOSCOPY (EGD) WITH  PROPOFOL N/A 07/30/2016   Procedure: ESOPHAGOGASTRODUODENOSCOPY (EGD) WITH PROPOFOL;  Surgeon: Jonathon Bellows, MD;  Location: [facility] ENDOSCOPY;  Service: Endoscopy;  Laterality: N/A;  . TONSILLECTOMY    . TUBAL LIGATION      SOCIAL HISTORY:  Social History  Substance Use Topics  . Smoking status: Former Smoker    Packs/day: 1.00    Years: 30.00  . Smokeless tobacco: Never Used  . Alcohol use Yes     Comment: occasionally    FAMILY HISTORY:  Family History  Problem Relation Age of Onset  . Diabetes Mother   . CVA Mother   . Heart disease Father   . Heart disease Sister   . Heart disease Brother   . Heart disease Sister   . Heart disease Sister   . Heart disease Sister   . Heart disease Brother   . Heart disease Brother   . Heart disease Brother     DRUG ALLERGIES: No Known Allergies  Review of Systems  Constitutional: Positive for malaise/fatigue. Negative for chills, fever and weight loss.  HENT: Negative for congestion.   Eyes: Negative for blurred vision and double vision.  Respiratory: Negative for cough, sputum production, shortness of breath and wheezing.   Cardiovascular: Positive for chest pain. Negative for palpitations, orthopnea, leg swelling and PND.  Gastrointestinal: Positive for nausea. Negative for abdominal pain, blood in stool, constipation, diarrhea and vomiting.  Genitourinary: Negative for dysuria, frequency, hematuria and urgency.  Musculoskeletal: Positive for myalgias. Negative for falls.  Neurological: Positive for dizziness. Negative for tremors, focal weakness and  headaches.  Endo/Heme/Allergies: Does not bruise/bleed easily.  Psychiatric/Behavioral: Negative for depression. The patient does not have insomnia.     MEDICATIONS AT HOME:  Prior to Admission medications   Medication Sig Start Date End Date Taking? Authorizing Provider  acyclovir (ZOVIRAX) 400 MG tablet take 1 tablet by mouth twice a day 05/19/16  Yes Arnetha Courser, MD  amoxicillin  (AMOXIL) 500 MG capsule Take 2 capsules (1,000 mg total) by mouth 3 (three) times daily. 08/01/16 08/15/16 Yes Jonathon Bellows, MD  amphetamine-dextroamphetamine (ADDERALL) 30 MG tablet Take 30 mg by mouth 2 (two) times daily.   Yes Historical Provider, MD  aspirin EC 81 MG tablet Take 81 mg by mouth daily.   Yes Historical Provider, MD  clarithromycin (BIAXIN) 250 MG tablet Take 2 tablets (500 mg total) by mouth 2 (two) times daily. 08/01/16 08/15/16 Yes Jonathon Bellows, MD  diazepam (VALIUM) 10 MG tablet Take 1 tablet (10 mg total) by mouth every 8 (eight) hours as needed for anxiety. 09/22/15  Yes Fritzi Mandes, MD  fluticasone (FLONASE) 50 MCG/ACT nasal spray Place 2 sprays into both nostrils daily. 06/09/16  Yes Arnetha Courser, MD  metoprolol (LOPRESSOR) 50 MG tablet Take 1 tablet (50 mg total) by mouth 2 (two) times daily. 06/09/16  Yes Arnetha Courser, MD  omeprazole (PRILOSEC) 20 MG capsule Take 1 capsule (20 mg total) by mouth 2 (two) times daily before a meal. 08/01/16 08/15/16 Yes Jonathon Bellows, MD  pantoprazole (PROTONIX) 40 MG tablet Take 1 tablet (40 mg total) by mouth daily. 07/30/16  Yes Bettey Costa, MD  Pitavastatin Calcium (LIVALO) 2 MG TABS One by mouth daily at bedtime (for cholesterol) Patient taking differently: Take 1 tablet by mouth daily. One by mouth daily at bedtime (for cholesterol) 07/08/16  Yes Arnetha Courser, MD  zolpidem (AMBIEN) 10 MG tablet Take 10 mg by mouth at bedtime as needed.     Yes Historical Provider, MD  [place] 5-2.5-18.5 injection  06/09/16   Historical Provider, MD      PHYSICAL EXAMINATION:   VITAL SIGNS: Blood pressure 113/71, pulse 66, temperature 98.6 F (37 C), temperature source Oral, resp. rate 18, height 5\' 5"  (1.651 m), weight 90.7 kg (200 lb), SpO2 93 %.  GENERAL:  61 y.o.-year-old patient lying in the bed In mild  distress, inattentive, has some intermittent myoclonus, anxious appearance.  EYES: Pupils equal, round, reactive to light and accommodation. No scleral  icterus. Extraocular muscles intact.  HEENT: Head atraumatic, normocephalic. Oropharynx and nasopharynx clear.  NECK:  Supple, no jugular venous distention. No thyroid enlargement, no tenderness.  LUNGS: Normal breath sounds bilaterally, no wheezing, rales,rhonchi or crepitation. No use of accessory muscles of respiration.  CARDIOVASCULAR: S1, S2 , irregular. No murmurs, rubs, or gallops.  ABDOMEN: Soft, nontender, nondistended. Bowel sounds present. No organomegaly or mass.  EXTREMITIES: No pedal edema, cyanosis, or clubbing. Some calf tenderness on palpation bilaterally NEUROLOGIC: Cranial nerves II through XII are intact. Muscle strength 5/5 in all extremities. Sensation intact. Gait not checked.  PSYCHIATRIC: The patient is alert and oriented x 3.  SKIN: No obvious rash, lesion, or ulcer.   LABORATORY PANEL:   CBC  Recent Labs Lab 08/11/16 1255  WBC 7.9  HGB 13.0  HCT 38.5  PLT 369  MCV 94.4  MCH 32.0  MCHC 33.9  RDW 14.1  LYMPHSABS 2.6  MONOABS 0.9  EOSABS 0.1  BASOSABS 0.1   ------------------------------------------------------------------------------------------------------------------  Chemistries   Recent Labs Lab 08/11/16 1255  NA  138  K 4.3  CL 102  CO2 27  GLUCOSE 103*  BUN 13  CREATININE 0.83  CALCIUM 8.6*   ------------------------------------------------------------------------------------------------------------------  Cardiac Enzymes  Recent Labs Lab 08/11/16 1255  TROPONINI <0.03   ------------------------------------------------------------------------------------------------------------------  RADIOLOGY: Dg Chest 1 View  Result Date: 08/11/2016 CLINICAL DATA:  Chest pain and shortness of breath since yesterday EXAM: CHEST 1 VIEW COMPARISON:  11/28/2015 FINDINGS: Resolved right-sided airspace disease. There is no edema, consolidation, effusion, or pneumothorax. Normal heart size and mediastinal contours. Coronary stent noted.  IMPRESSION: No evidence of active disease. Electronically Signed   By: Monte Fantasia M.D.   On: 08/11/2016 13:19    EKG: Orders placed or performed during the hospital encounter of 08/11/16  . ED EKG  . ED EKG  EKG in the emergency room revealed atrial rhythm at rate of 63, normal axis, no acute ST-T changes  IMPRESSION AND PLAN:  Active Problems:   Chest pain   Essential hypertension, malignant   Dizziness   Bilateral calf pain  #1. Chest pain of unclear etiology, EKG reveals atrial rhythm, continue metoprolol, aspirin, Lovenox, statin, follow cardiac enzymes 3, cartilage consultation is requested, per neurologist recommended cardiac catheterization tomorrow #2. Malignant essential hypertension, continue metoprolol, added lisinopril #3, dizziness, could be hypertensive encephalopathy, follow with therapy, get physical therapist involved recommendations #4. Bilateral calf pain of unclear etiology, check CK levels to rule out rhabdomyolysis due to statin #5. Peptic ulcer disease, continue Protonix  All the records are reviewed and case discussed with ED provider. Management plans discussed with the patient, family and they are in agreement.  CODE STATUS: Code Status History    Date Active Date Inactive Code Status Order ID Comments User Context   07/29/2016  3:33 PM 07/30/2016  9:17 PM Full Code [id number]  [name], MD ED   09/21/2015  2:29 AM 09/22/2015  2:18 PM Full Code [id number]  Saundra Shelling, MD Inpatient       TOTAL TIME TAKING CARE OF THIS PATIENT: 50 minutes.    [name] M.D on 08/11/2016 at 3:37 PM  Between 7am to 6pm - Pager - [phone] After 6pm go to [email or web address] - password [email or web address] [facility]  Office  [phone]  CC: Primary care physician; Enid Derry, MD

## 2016-08-11 NOTE — Progress Notes (Signed)
Linda Mejia is a 61 y.o. female  [id number]  Primary Cardiologist: Linda Mejia Reason for Consultation: Chest pain  HPI: This is a 61 year old white female with a history of coronary artery disease status post PCI and stenting of the mid LAD presented to the hospital with chest pain shortness of breath.   Review of Systems: Chest pain radiates to the back and neck no orthopnea PND or leg swelling   Past Medical History:  Diagnosis Date  . ADHD (attention deficit hyperactivity disorder)   . Anxiety   . Chronic back pain   . Coronary artery disease   . Herpes genitalis in women   . History of stomach ulcers    x7 this year. Bleeding  . Hypertension      (Not in a hospital admission)     Infusions:   No Known Allergies  Social History   Social History  . Marital status: Widowed    Spouse name: N/A  . Number of children: N/A  . Years of education: N/A   Occupational History  . disabled    Social History Main Topics  . Smoking status: Former Smoker    Packs/day: 1.00    Years: 30.00  . Smokeless tobacco: Never Used  . Alcohol use Yes     Comment: occasionally  . Drug use: No  . Sexual activity: No   Other Topics Concern  . Not on file   Social History Narrative  . No narrative on file    Family History  Problem Relation Age of Onset  . Diabetes Mother   . CVA Mother   . Heart disease Father   . Heart disease Sister   . Heart disease Brother   . Heart disease Sister   . Heart disease Sister   . Heart disease Sister   . Heart disease Brother   . Heart disease Brother   . Heart disease Brother     PHYSICAL EXAM: Vitals:   08/11/16 1330 08/11/16 1400  BP: (!) 153/102 113/71  Pulse: 67 66  Resp: 13 18  Temp:      No intake or output data in the 24 hours ending 08/11/16 1457  General:  Well appearing. No respiratory difficulty HEENT: normal Neck: supple. no JVD. Carotids 2+ bilat; no bruits. No lymphadenopathy or thryomegaly  appreciated. Cor: PMI nondisplaced. Regular rate & rhythm. No rubs, gallops or murmurs. Lungs: clear Abdomen: soft, nontender, nondistended. No hepatosplenomegaly. No bruits or masses. Good bowel sounds. Extremities: no cyanosis, clubbing, rash, edema Neuro: alert & oriented x 3, cranial nerves grossly intact. moves all 4 extremities w/o difficulty. Affect pleasant.  TFT:DDUKG rhythm no acute changes  Results for orders placed or performed during the hospital encounter of 08/11/16 (from the past 24 hour(s))  Basic metabolic panel     Status: Abnormal   Collection Time: 08/11/16 12:55 PM  Result Value Ref Range   Sodium 138 135 - 145 mmol/L   Potassium 4.3 3.5 - 5.1 mmol/L   Chloride 102 101 - 111 mmol/L   CO2 27 22 - 32 mmol/L   Glucose, Bld 103 (H) 65 - 99 mg/dL   BUN 13 6 - 20 mg/dL   Creatinine, Ser 0.83 0.44 - 1.00 mg/dL   Calcium 8.6 (L) 8.9 - 10.3 mg/dL   GFR calc non Af Amer >60 >60 mL/min   GFR calc Af Amer >60 >60 mL/min   Anion gap 9 5 - 15  CBC with Differential  Status: None   Collection Time: 08/11/16 12:55 PM  Result Value Ref Range   WBC 7.9 3.6 - 11.0 K/uL   RBC 4.07 3.80 - 5.20 MIL/uL   Hemoglobin 13.0 12.0 - 16.0 g/dL   HCT 38.5 35.0 - 47.0 %   MCV 94.4 80.0 - 100.0 fL   MCH 32.0 26.0 - 34.0 pg   MCHC 33.9 32.0 - 36.0 g/dL   RDW 14.1 11.5 - 14.5 %   Platelets 369 150 - 440 K/uL   Neutrophils Relative % 54 %   Neutro Abs 4.2 1.4 - 6.5 K/uL   Lymphocytes Relative 33 %   Lymphs Abs 2.6 1.0 - 3.6 K/uL   Monocytes Relative 11 %   Monocytes Absolute 0.9 0.2 - 0.9 K/uL   Eosinophils Relative 1 %   Eosinophils Absolute 0.1 0 - 0.7 K/uL   Basophils Relative 1 %   Basophils Absolute 0.1 0 - 0.1 K/uL  Troponin I     Status: None   Collection Time: 08/11/16 12:55 PM  Result Value Ref Range   Troponin I <0.03 <0.03 ng/mL   Dg Chest 1 View  Result Date: 08/11/2016 CLINICAL DATA:  Chest pain and shortness of breath since yesterday EXAM: CHEST 1 VIEW  COMPARISON:  11/28/2015 FINDINGS: Resolved right-sided airspace disease. There is no edema, consolidation, effusion, or pneumothorax. Normal heart size and mediastinal contours. Coronary stent noted. IMPRESSION: No evidence of active disease. Electronically Signed   By: Monte Fantasia M.D.   On: 08/11/2016 13:19     ASSESSMENT AND PLAN:Acute coronary syndrome with history of PCI and stenting of the mid LAD presented to the hospital with chest pain at rest associated with shortness of breath and radiation to the back and neck. Advise cardiac catheterization. She had CTA coronaries in 2016 which showed stent in the mid LAD had no significant restenosis and it was recommended to do cardiac catheter if she has recurrent chest pain.  Linda Mejia A

## 2016-08-12 ENCOUNTER — Encounter: Payer: Self-pay | Admitting: Cardiovascular Disease

## 2016-08-12 ENCOUNTER — Encounter
Admission: EM | Disposition: A | Discharge status: Home / Self Care | Payer: Self-pay | Source: Home / Self Care | Attending: Internal Medicine

## 2016-08-12 DIAGNOSIS — I249 Acute ischemic heart disease, unspecified: Secondary | ICD-10-CM | POA: Diagnosis not present

## 2016-08-12 DIAGNOSIS — R42 Dizziness and giddiness: Secondary | ICD-10-CM | POA: Diagnosis not present

## 2016-08-12 DIAGNOSIS — R079 Chest pain, unspecified: Secondary | ICD-10-CM | POA: Diagnosis not present

## 2016-08-12 DIAGNOSIS — I10 Essential (primary) hypertension: Secondary | ICD-10-CM | POA: Diagnosis not present

## 2016-08-12 DIAGNOSIS — M79661 Pain in right lower leg: Secondary | ICD-10-CM | POA: Diagnosis not present

## 2016-08-12 HISTORY — PX: LEFT HEART CATH AND CORONARY ANGIOGRAPHY: CATH118249

## 2016-08-12 LAB — CBC
HCT: 38.2 % (ref 35.0–47.0)
Hemoglobin: 12.7 g/dL (ref 12.0–16.0)
MCH: 32.2 pg (ref 26.0–34.0)
MCHC: 33.3 g/dL (ref 32.0–36.0)
MCV: 96.8 fL (ref 80.0–100.0)
Platelets: 346 10*3/uL (ref 150–440)
RBC: 3.94 MIL/uL (ref 3.80–5.20)
RDW: 14.3 % (ref 11.5–14.5)
WBC: 7 10*3/uL (ref 3.6–11.0)

## 2016-08-12 LAB — BASIC METABOLIC PANEL
Anion gap: 5 (ref 5–15)
BUN: 20 mg/dL (ref 6–20)
CO2: 31 mmol/L (ref 22–32)
Calcium: 8.2 mg/dL — ABNORMAL LOW (ref 8.9–10.3)
Chloride: 103 mmol/L (ref 101–111)
Creatinine, Ser: 1.16 mg/dL — ABNORMAL HIGH (ref 0.44–1.00)
GFR calc Af Amer: 59 mL/min — ABNORMAL LOW (ref >60)
GFR calc non Af Amer: 50 mL/min — ABNORMAL LOW (ref >60)
Glucose, Bld: 104 mg/dL — ABNORMAL HIGH (ref 65–99)
Potassium: 4.7 mmol/L (ref 3.5–5.1)
Sodium: 139 mmol/L (ref 135–145)

## 2016-08-12 LAB — TROPONIN I
Troponin I: <0.03 ng/mL (ref <0.03)
Troponin I: <0.03 ng/mL (ref <0.03)

## 2016-08-12 SURGERY — LEFT HEART CATH AND CORONARY ANGIOGRAPHY
Anesthesia: Moderate Sedation | Laterality: Right

## 2016-08-12 MED ORDER — SODIUM CHLORIDE 0.9% FLUSH: 3.0000 mL | INTRAVENOUS | Status: DC | PRN

## 2016-08-12 MED ORDER — HEPARIN (PORCINE) IN NACL 2-0.9 UNIT/ML-% IJ SOLN
INTRAMUSCULAR | Status: AC
  Filled 2016-08-12: qty 500

## 2016-08-12 MED ORDER — ACETAMINOPHEN 325 MG PO TABS: 650.0000 mg | ORAL_TABLET | ORAL | Status: DC | PRN

## 2016-08-12 MED ORDER — PANTOPRAZOLE SODIUM 40 MG PO TBEC: 40.0000 mg | DELAYED_RELEASE_TABLET | Freq: Two times a day (BID) | ORAL | Status: DC

## 2016-08-12 MED ORDER — SODIUM CHLORIDE 0.9 % WEIGHT BASED INFUSION: 1.0000 mL/kg/h | INTRAVENOUS | Status: DC

## 2016-08-12 MED ORDER — SODIUM CHLORIDE 0.9 % IV SOLN: 250.0000 mL | INTRAVENOUS | Status: DC | PRN

## 2016-08-12 MED ORDER — MIDAZOLAM HCL 2 MG/2ML IJ SOLN
INTRAMUSCULAR | Status: AC
  Filled 2016-08-12: qty 2

## 2016-08-12 MED ORDER — ONDANSETRON HCL 4 MG/2ML IJ SOLN: 4.0000 mg | Freq: Four times a day (QID) | INTRAMUSCULAR | Status: DC | PRN

## 2016-08-12 MED ORDER — IOPAMIDOL (ISOVUE-300) INJECTION 61%
INTRAVENOUS | Status: DC | PRN
  Administered 2016-08-12: 90 mL via INTRA_ARTERIAL

## 2016-08-12 MED ORDER — LIDOCAINE HCL (PF) 1 % IJ SOLN
INTRAMUSCULAR | Status: DC | PRN
Start: 2016-08-12 — End: 2016-08-12
  Administered 2016-08-12: 18 mL via SUBCUTANEOUS

## 2016-08-12 MED ORDER — FENTANYL CITRATE (PF) 100 MCG/2ML IJ SOLN
INTRAMUSCULAR | Status: DC | PRN
  Administered 2016-08-12: 25 ug via INTRAVENOUS

## 2016-08-12 MED ORDER — LIDOCAINE HCL (PF) 1 % IJ SOLN
INTRAMUSCULAR | Status: AC
  Filled 2016-08-12: qty 30

## 2016-08-12 MED ORDER — LISINOPRIL 5 MG PO TABS: 5.0000 mg | ORAL_TABLET | Freq: Every day | ORAL | 0 refills | Status: DC

## 2016-08-12 MED ORDER — MIDAZOLAM HCL 2 MG/2ML IJ SOLN
INTRAMUSCULAR | Status: DC | PRN
  Administered 2016-08-12: 1 mg via INTRAVENOUS

## 2016-08-12 MED ORDER — AMOXICILLIN 500 MG PO CAPS: 1000.0000 mg | ORAL_CAPSULE | Freq: Two times a day (BID) | ORAL | Status: DC

## 2016-08-12 MED ORDER — FENTANYL CITRATE (PF) 100 MCG/2ML IJ SOLN
INTRAMUSCULAR | Status: AC
  Filled 2016-08-12: qty 2

## 2016-08-12 MED ORDER — AMOXICILLIN 500 MG PO CAPS: 1000.0000 mg | ORAL_CAPSULE | Freq: Two times a day (BID) | ORAL | 0 refills | Status: AC

## 2016-08-12 MED ORDER — SODIUM CHLORIDE 0.9% FLUSH: 3.0000 mL | Freq: Two times a day (BID) | INTRAVENOUS | Status: DC

## 2016-08-12 SURGICAL SUPPLY — 10 items
CATH INFINITI 5FR ANG PIGTAIL (CATHETERS) ×1 IMPLANT
CATH INFINITI 5FR JL4 (CATHETERS) ×1 IMPLANT
CATH INFINITI JR4 5F (CATHETERS) ×2 IMPLANT
DEVICE CLOSURE MYNXGRIP 5F (Vascular Products) ×1 IMPLANT
KIT MANI 3VAL PERCEP (MISCELLANEOUS) ×2 IMPLANT
NDL PERC 18GX7CM (NEEDLE) IMPLANT
NEEDLE PERC 18GX7CM (NEEDLE) ×2 IMPLANT
PACK CARDIAC CATH (CUSTOM PROCEDURE TRAY) ×2 IMPLANT
SHEATH PINNACLE 5F 10CM (SHEATH) ×1 IMPLANT
WIRE EMERALD 3MM-J .035X260CM (WIRE) ×1 IMPLANT

## 2016-08-12 NOTE — Progress Notes (Signed)
SUBJECTIVE: No further cp   Vitals:   08/11/16 2222 08/12/16 0501 08/12/16 0716 08/12/16 0857  BP: 133/71 96/75 107/60   Pulse: 76 61 61   Resp:  18 20   Temp: 98.1 F (36.7 C) 98.1 F (36.7 C) 97.4 F (36.3 C)   TempSrc: Oral Oral    SpO2: 92% 97% 95% 99%  Weight:   200 lb (90.7 kg)   Height:   5\' 5"  (1.651 m)    No intake or output data in the 24 hours ending 08/12/16 0943  LABS: Basic Metabolic Panel:  Recent Labs  08/11/16 1255 08/11/16 1912  NA 138 140  K 4.3 4.1  CL 102 99*  CO2 27 30  GLUCOSE 103* 146*  BUN 13 13  CREATININE 0.83 1.11*  CALCIUM 8.6* 9.2  MG  --  2.2   Liver Function Tests: No results for input(s): AST, ALT, ALKPHOS, BILITOT, PROT, ALBUMIN in the last 72 hours. No results for input(s): LIPASE, AMYLASE in the last 72 hours. CBC:  Recent Labs  08/11/16 1255 08/11/16 1912  WBC 7.9 8.6  NEUTROABS 4.2  --   HGB 13.0 14.1  HCT 38.5 42.5  MCV 94.4 97.5  PLT 369 397   Cardiac Enzymes:  Recent Labs  08/11/16 1545 08/11/16 1912 08/12/16 0105  CKTOTAL 118  --   --   TROPONINI <0.03 <0.03 <0.03   BNP: Invalid input(s): POCBNP D-Dimer: No results for input(s): DDIMER in the last 72 hours. Hemoglobin A1C: No results for input(s): HGBA1C in the last 72 hours. Fasting Lipid Panel: No results for input(s): CHOL, HDL, LDLCALC, TRIG, CHOLHDL, LDLDIRECT in the last 72 hours. Thyroid Function Tests:  Recent Labs  08/11/16 1912  TSH 3.541   Anemia Panel: No results for input(s): VITAMINB12, FOLATE, FERRITIN, TIBC, IRON, RETICCTPCT in the last 72 hours.   PHYSICAL EXAM General: Well developed, well nourished, in no acute distress HEENT:  Normocephalic and atramatic Neck:  No JVD.  Lungs: Clear bilaterally to auscultation and percussion. Heart: HRRR . Normal S1 and S2 without gallops or murmurs.  Abdomen: Bowel sounds are positive, abdomen soft and non-tender  Msk:  Back normal, normal gait. Normal strength and tone for  age. Extremities: No clubbing, cyanosis or edema.   Neuro: Alert and oriented X 3. Psych:  Good affect, responds appropriately  TELEMETRY: NSR  ASSESSMENT AND PLAN: Cardiac cath revelaed, stent in LAD has no significant restenosis but RCA is occluded with good collaterals, treat medically and and tretment fro GERD be done. May go home with f/u Friday at 11 am.  Active Problems:   Chest pain   Essential hypertension, malignant   Dizziness   Bilateral calf pain    Mujtaba Bollig A, MD, [facility] 08/12/2016 9:43 AM

## 2016-08-12 NOTE — Progress Notes (Signed)
Patient clinically stable post heart cath. Vitals stable. Daughter at bedside. No bleeding nor hematoma at right groin site. Sinus rhythm per monitor. Report called to Serenity RN on telemetry with plan reviewed. Questions answered.

## 2016-08-12 NOTE — Discharge Instructions (Signed)
Coronary Artery Disease, Female Coronary artery disease (CAD) is a condition in which the arteries that lead to the heart (coronary arteries) become narrow or blocked. The narrowing or blockage can lead to decreased blood flow to the heart. Prolonged reduced blood flow can cause a heart attack (myocardial infarction or MI). This condition may also be called coronary heart disease. Because CAD is the leading cause of death in women, it is important to understand what causes this condition and how it is treated. What are the causes? CAD is most often caused by atherosclerosis. This is the buildup of fat and cholesterol (plaque) on the inside of the arteries. Over time, the plaque may narrow or block the artery, reducing blood flow to the heart. Plaque can also become weak and break off within a coronary artery and cause a sudden blockage. Other less common causes of CAD include:  An embolism or blood clot in a coronary artery.  A tearing of the artery (spontaneous coronary artery dissection).  An aneurysm.  Inflammation (vasculitis) in the artery wall.  What increases the risk? The following factors may make you more likely to develop this condition:  Age. Women over age 55 are at a greater risk of CAD.  Family history of CAD.  High blood pressure (hypertension).  Diabetes.  High cholesterol levels.  Tobacco use.  Lack of exercise.  Menopause. ? All postmenopausal women are at greater risk of CAD. ? Women who have experienced menopause between the ages of 40-45 (early menopause) are at a higher risk of CAD. ? Women who have experienced menopause before age 40 (premature menopause) are at a very high risk of CAD.  Excessive alcohol use  A diet high in saturated and trans fats, such as fried food and processed meat.  Other possible risk factors include:  High stress levels.  Depression  Obesity.  Sleep apnea.  What are the signs or symptoms? Many people do not have any  symptoms during the early stages of CAD. As the condition progresses, symptoms may include:  Chest pain (angina). The pain can: ? Feel like crushing or squeezing, or a tightness, pressure, fullness, or heaviness in the chest. ? Last more than a few minutes or can stop and recur. The pain tends to get worse with exercise or stress and to fade with rest.  Pain in the arms, neck, jaw, or back.  Unexplained heartburn or indigestion.  Shortness of breath.  Nausea.  Sudden cold sweats.  Sudden light-headedness.  Fluttering or fast heartbeat (palpitations).  Many women have chest discomfort and the other symptoms. However, women often have unusual (atypical) symptoms, such as:  Fatigue.  Vomiting.  Unexplained feelings of nervousness or anxiety.  Unexplained weakness.  Dizziness or fainting.  How is this diagnosed? This condition is diagnosed based on:  Your family and medical history.  A physical exam.  Tests, including: ? A test to check the electrical signals in your heart (electrocardiogram). ? Exercise stress test. This looks for signs of blockage when the heart is stressed with exercise, such as running on a treadmill. ? Pharmacologic stress test. This test looks for signs of blockage when the heart is being stressed with a medicine. ? Blood tests. ? Coronary angiogram. This is a procedure to look at the coronary arteries to see if there is any blockage. During this test, a dye is injected into your arteries so they appear on an X-ray. ? A test that uses sound waves to take a picture of   your heart (echocardiogram). ? Chest X-ray.  How is this treated? This condition may be treated by:  Healthy lifestyle changes to reduce risk factors.  Medicines such as: ? Antiplatelet medicines and blood-thinning medicines, such as aspirin. These help prevent blood clots. ? Nitroglycerin. ? Blood pressure medicines. ? Cholesterol-lowering medicine.  Coronary angioplasty and  stenting. During this procedure, a thin, flexible tube is inserted through a blood vessel and into a blocked artery. A balloon or similar device on the end of the tube is inflated to open up the artery. In some cases, a small, mesh tube (stent) is inserted into the artery to keep it open.  Coronary artery bypass surgery. During this surgery, veins or arteries from other parts of the body are used to create a bypass around the blockage and allow blood to reach your heart.  Follow these instructions at home: Medicines  Take over-the-counter and prescription medicines only as told by your health care provider.  Do not take the following medicines unless your health care provider approves: ? NSAIDs, such as ibuprofen, naproxen, or celecoxib. ? Vitamin supplements that contain vitamin A, vitamin E, or both. ? Hormone replacement therapy that contains estrogen with or without progestin. Lifestyle  Follow an exercise program approved by your health care provider. Aim for 150 minutes of moderate exercise or 75 minutes of vigorous exercise each week.  Maintain a healthy weight or lose weight as approved by your health care provider.  Rest when you are tired.  Learn to manage stress or try to limit your stress. Ask your health care provider for suggestions if you need help.  Get screened for depression and seek treatment, if needed.  Do not use any products that contain nicotine or tobacco, such as cigarettes and e-cigarettes. If you need help quitting, ask your health care provider.  Do not use illegal drugs. Eating and drinking  Follow a heart-healthy diet. A dietitian can help educate you about healthy food options and changes. In general, eat plenty of fruits and vegetables, lean meats, and whole grains.  Avoid foods high in: ? Sugar. ? Salt (sodium). ? Saturated fats, such as processed or fatty meat. ? Trans fats, such as fried food.  Use healthy cooking methods such as roasting,  grilling, broiling, baking, poaching, steaming, or stir-frying.  If you drink alcohol, and your health care provider approves, limit your alcohol intake to no more than 1 drink per day. One drink equals 12 ounces of beer, 5 ounces of wine, or 1 ounces of hard liquor. General instructions  Manage any other health conditions, such as hypertension and diabetes. These conditions affect your heart.  Your health care provider may ask you to monitor your blood pressure. Ideally, your blood pressure should be below 130/80.  Keep all follow-up visits as told by your health care provider. This is important. Get help right away if:  You have pain in your chest, neck, arm, jaw, stomach, or back that: ? Lasts more than a few minutes. ? Is recurring. ? Is not relieved by taking medicine under your tongue (sublingualnitroglycerin).  You have profuse sweating without cause.  You have unexplained: ? Heartburn or indigestion. ? Shortness of breath or difficulty breathing. ? Fluttering or fast heartbeat (palpitations). ? Nausea or vomiting. ? Fatigue. ? Feelings of nervousness or anxiety. ? Weakness. ? Diarrhea.  You have sudden light-headedness or dizziness.  You faint.  You feel like hurting yourself or think about taking your own life. These symptoms may represent   a serious problem that is an emergency. Do not wait to see if the symptoms will go away. Get medical help right away. Call your local emergency services (911 in the [place] Do not drive yourself to the hospital. Summary  Coronary artery disease (CAD) is a process in which the arteries that lead to the heart (coronary arteries) become narrow or blocked. The narrowing or blockage can lead to a heart attack.  Many women have chest discomfort and other common symptoms of CAD. However, women often have different (atypical) symptoms, such as fatigue, vomiting, and dizziness or weakness.  CAD can be treated with lifestyle changes,  medicines, surgery, or a combination of these treatments. This information is not intended to replace advice given to you by your health care provider. Make sure you discuss any questions you have with your health care provider. Document Released: 06/23/2011 Document Revised: 03/21/2016 Document Reviewed: 03/21/2016 Elsevier Interactive Patient Education  2017 [employer]vier Inc.  

## 2016-08-12 NOTE — Progress Notes (Signed)
IVs and tele removed. Discharge instructions given to patient. Patient verbalized understanding. No distress at this time. Daughter is at bedside and will be transporting patient home.

## 2016-08-12 NOTE — Discharge Summary (Signed)
[facility] [place] NAME: Linda Mejia    MR#:  [id number]  DATE OF BIRTH:  1956/01/08  DATE OF ADMISSION:  08/11/2016   ADMITTING PHYSICIAN: [name], MD  DATE OF DISCHARGE: 08/12/2016  3:35 PM  PRIMARY CARE PHYSICIAN: Enid Derry, MD   ADMISSION DIAGNOSIS:  Nonspecific chest pain [R07.9] DISCHARGE DIAGNOSIS:  Active Problems:   Chest pain   Essential hypertension, malignant   Dizziness   Bilateral calf pain  SECONDARY DIAGNOSIS:   Past Medical History:  Diagnosis Date  . ADHD (attention deficit hyperactivity disorder)   . Anxiety   . Chronic back pain   . Coronary artery disease   . Herpes genitalis in women   . History of stomach ulcers    x7 this year. Bleeding  . Hypertension    HOSPITAL COURSE:  61 y.o. female with a known history of Recent admission in the middle of April for nausea, vomiting, suspected hematemesis, status post EGD revealing gastric ulcers with clean base, coronary artery disease, admitted with complaints of midsternal chest pain.  *Chest pain: Can be due to anxiety with underlying coronary disease.  She was ruled out with 3 negative serial troponins. Evaluated by cardiology who recommended cardiac cath which was performed on May 1st - stent in LAD has no significant restenosis but RCA is occluded with good collaterals.  They recommended medical management.  Close follow-up with primary care physician and her cardiology.  Patient and family in agreement.  Her chest pain has resolved.  *Anxiety: This seemed to be contributor a lot of her symptoms.  She is already on medications for same. recommend continuing. *History of H. Pylori: Being treated with medications. *Dizziness: Could be due to hypotension.  Limiting my ability to add any new antihypertensive or cardiac medications at this time. History of H. pylori *  DISCHARGE CONDITIONS:  Stable CONSULTS OBTAINED:  Treatment Team:  Dionisio David, MD DRUG ALLERGIES:  No Known Allergies DISCHARGE MEDICATIONS:   Allergies as of 08/12/2016   No Known Allergies     Medication List    TAKE these medications   acyclovir 400 MG tablet Commonly known as:  ZOVIRAX take 1 tablet by mouth twice a day   amoxicillin 500 MG capsule Commonly known as:  AMOXIL Take 2 capsules (1,000 mg total) by mouth 2 (two) times daily. What changed:  when to take this   amphetamine-dextroamphetamine 30 MG tablet Commonly known as:  ADDERALL Take 30 mg by mouth 2 (two) times daily.   aspirin EC 81 MG tablet Take 81 mg by mouth daily.   BOOSTRIX 5-2.5-18.5 LF-MCG/0.5 injection Generic drug:  Tdap   clarithromycin 250 MG tablet Commonly known as:  BIAXIN Take 2 tablets (500 mg total) by mouth 2 (two) times daily.   diazepam 10 MG tablet Commonly known as:  VALIUM Take 1 tablet (10 mg total) by mouth every 8 (eight) hours as needed for anxiety.   fluticasone 50 MCG/ACT nasal spray Commonly known as:  FLONASE Place 2 sprays into both nostrils daily.   metoprolol 50 MG tablet Commonly known as:  LOPRESSOR Take 1 tablet (50 mg total) by mouth 2 (two) times daily.   omeprazole 20 MG capsule Commonly known as:  PRILOSEC Take 1 capsule (20 mg total) by mouth 2 (two) times daily before a meal.   pantoprazole 40 MG tablet Commonly known as:  PROTONIX Take 1 tablet (  40 mg total) by mouth daily.   Pitavastatin Calcium 2 MG Tabs Commonly known as:  LIVALO One by mouth daily at bedtime (for cholesterol) What changed:  how much to take  how to take this  when to take this  additional instructions   zolpidem 10 MG tablet Commonly known as:  AMBIEN Take 10 mg by mouth at bedtime as needed.      DISCHARGE INSTRUCTIONS:   DIET:  Cardiac diet DISCHARGE CONDITION:  Good ACTIVITY:  Activity as tolerated OXYGEN:  Home Oxygen: No.  Oxygen Delivery: room air DISCHARGE LOCATION:  home   If you experience worsening of your  admission symptoms, develop shortness of breath, life threatening emergency, suicidal or homicidal thoughts you must seek medical attention immediately by calling 911 or calling your MD immediately  if symptoms less severe.  You Must read complete instructions/literature along with all the possible adverse reactions/side effects for all the Medicines you take and that have been prescribed to you. Take any new Medicines after you have completely understood and accpet all the possible adverse reactions/side effects.   Please note  You were cared for by a hospitalist during your hospital stay. If you have any questions about your discharge medications or the care you received while you were in the hospital after you are discharged, you can call the unit and asked to speak with the hospitalist on call if the hospitalist that took care of you is not available. Once you are discharged, your primary care physician will handle any further medical issues. Please note that NO REFILLS for any discharge medications will be authorized once you are discharged, as it is imperative that you return to your primary care physician (or establish a relationship with a primary care physician if you do not have one) for your aftercare needs so that they can reassess your need for medications and monitor your lab values.    On the day of Discharge:  VITAL SIGNS:  Blood pressure 105/60, pulse (!) 59, temperature 98 F (36.7 C), temperature source Oral, resp. rate 16, height 5\' 5"  (1.651 m), weight 90.7 kg (200 lb), SpO2 98 %. PHYSICAL EXAMINATION:  GENERAL:  61 y.o.-year-old patient lying in the bed with no acute distress.  EYES: Pupils equal, round, reactive to light and accommodation. No scleral icterus. Extraocular muscles intact.  HEENT: Head atraumatic, normocephalic. Oropharynx and nasopharynx clear.  NECK:  Supple, no jugular venous distention. No thyroid enlargement, no tenderness.  LUNGS: Normal breath sounds  bilaterally, no wheezing, rales,rhonchi or crepitation. No use of accessory muscles of respiration.  CARDIOVASCULAR: S1, S2 normal. No murmurs, rubs, or gallops.  ABDOMEN: Soft, non-tender, non-distended. Bowel sounds present. No organomegaly or mass.  EXTREMITIES: No pedal edema, cyanosis, or clubbing.  NEUROLOGIC: Cranial nerves II through XII are intact. Muscle strength 5/5 in all extremities. Sensation intact. Gait not checked.  PSYCHIATRIC: The patient is alert and oriented x 3.  SKIN: No obvious rash, lesion, or ulcer.  DATA REVIEW:   CBC  Recent Labs Lab 08/12/16 1104  WBC 7.0  HGB 12.7  HCT 38.2  PLT 346    Chemistries   Recent Labs Lab 08/11/16 1912 08/12/16 1104  NA 140 139  K 4.1 4.7  CL 99* 103  CO2 30 31  GLUCOSE 146* 104*  BUN 13 20  CREATININE 1.11* 1.16*  CALCIUM 9.2 8.2*  MG 2.2  --     Follow-up Information    Enid Derry, MD. Go on 08/19/2016.  Specialty:  Family Medicine Why:  Appointment Time: 1:40pm Contact information: [address] [place] [address] [phone]        Dionisio David, MD. Go on 08/15/2016.   Specialty:  Cardiology Why:  @ 11 am Contact information: [address] [place] [address] [phone]          Management plans discussed with the patient, family and they are in agreement.  CODE STATUS: Full Code   TOTAL TIME TAKING CARE OF THIS PATIENT: 45 minutes.    Max Sane M.D on 08/12/2016 at 4:52 PM  Between 7am to 6pm - Pager - [phone]  After 6pm go to [email or web address] - Proofreader  [facility]  Office  [phone]  CC: Primary care physician; Enid Derry, MD   Note: This dictation was prepared with Dragon dictation along with smaller phrase technology. Any transcriptional errors that result from this process are unintentional.

## 2016-08-12 NOTE — Progress Notes (Signed)
Pt refused her midnight and 0600 dose of nitro , stated" it given me bad headaches". Pt was educated on the consequences of not taking medication as ordered, she still refused.

## 2016-08-12 NOTE — Progress Notes (Signed)
PT Cancellation Note  Patient Details Name: Linda Mejia MRN: [id number] DOB: 1956/02/25   Cancelled Treatment:    Reason Eval/Treat Not Completed: Other (comment). Consult received and chart reviewed. Pt currently out of room for cardiac cath at this time. Unavailable for treatment. Will re-attempt at another time.   Kamrynn Melott 08/12/2016, 8:16 AM Greggory Stallion, PT, DPT [phone]

## 2016-08-12 NOTE — Care Management Obs Status (Signed)
[facility] NOTIFICATION   Patient Details  Name: Linda Mejia MRN: [id number] Date of Birth: 1955-11-25   Medicare Observation Status Notification Given:  No < 24 hours   Katrina Stack, RN 08/12/2016, 2:45 PM

## 2016-08-13 LAB — HEMOGLOBIN A1C
Hgb A1c MFr Bld: 5.8 % — ABNORMAL HIGH (ref 4.8–5.6)
Mean Plasma Glucose: 120 mg/dL

## 2016-08-19 ENCOUNTER — Ambulatory Visit: Payer: Medicare Other | Admitting: Family Medicine

## 2016-08-28 ENCOUNTER — Other Ambulatory Visit: Payer: Self-pay | Admitting: Family Medicine

## 2016-08-28 ENCOUNTER — Ambulatory Visit: Payer: Medicare Other | Admitting: Gastroenterology

## 2016-08-28 DIAGNOSIS — S2239XA Fracture of one rib, unspecified side, initial encounter for closed fracture: Secondary | ICD-10-CM | POA: Insufficient documentation

## 2016-08-28 DIAGNOSIS — S39012A Strain of muscle, fascia and tendon of lower back, initial encounter: Secondary | ICD-10-CM | POA: Insufficient documentation

## 2016-10-06 ENCOUNTER — Ambulatory Visit: Payer: Medicare Other | Admitting: Gastroenterology

## 2016-10-14 DIAGNOSIS — Z79899 Other long term (current) drug therapy: Secondary | ICD-10-CM | POA: Diagnosis not present

## 2016-10-16 DIAGNOSIS — E785 Hyperlipidemia, unspecified: Secondary | ICD-10-CM | POA: Diagnosis not present

## 2016-10-16 DIAGNOSIS — R079 Chest pain, unspecified: Secondary | ICD-10-CM | POA: Diagnosis not present

## 2016-10-16 DIAGNOSIS — I2510 Atherosclerotic heart disease of native coronary artery without angina pectoris: Secondary | ICD-10-CM | POA: Diagnosis not present

## 2016-10-23 ENCOUNTER — Telehealth: Payer: Self-pay | Admitting: Family Medicine

## 2016-10-23 NOTE — Telephone Encounter (Signed)
Patient no showed for her hospital follow-up appt May 8th She needs an appt please We'll get fasting labs then too Thank you

## 2016-10-24 NOTE — Telephone Encounter (Signed)
Pt scheduled appt for Aug. She wanted to wait and come in after she see Dr Cleda Mccreedy therefore you can get his notes. She apologizes for not being able to come in sooner but it was due to finances.

## 2016-11-04 ENCOUNTER — Other Ambulatory Visit: Payer: Self-pay

## 2016-11-04 ENCOUNTER — Emergency Department
Admission: EM | Admit: 2016-11-04 | Discharge: 2016-11-04 | Disposition: A | Discharge status: Home / Self Care | Payer: Medicare Other | Attending: Emergency Medicine | Admitting: Emergency Medicine

## 2016-11-04 ENCOUNTER — Emergency Department: Payer: Medicare Other

## 2016-11-04 ENCOUNTER — Encounter: Payer: Self-pay | Admitting: Medical Oncology

## 2016-11-04 DIAGNOSIS — R072 Precordial pain: Secondary | ICD-10-CM | POA: Diagnosis not present

## 2016-11-04 DIAGNOSIS — Z955 Presence of coronary angioplasty implant and graft: Secondary | ICD-10-CM | POA: Diagnosis not present

## 2016-11-04 DIAGNOSIS — I2510 Atherosclerotic heart disease of native coronary artery without angina pectoris: Secondary | ICD-10-CM | POA: Insufficient documentation

## 2016-11-04 DIAGNOSIS — E039 Hypothyroidism, unspecified: Secondary | ICD-10-CM | POA: Diagnosis not present

## 2016-11-04 DIAGNOSIS — R0602 Shortness of breath: Secondary | ICD-10-CM | POA: Diagnosis not present

## 2016-11-04 DIAGNOSIS — R42 Dizziness and giddiness: Secondary | ICD-10-CM | POA: Diagnosis not present

## 2016-11-04 DIAGNOSIS — Z87891 Personal history of nicotine dependence: Secondary | ICD-10-CM | POA: Diagnosis not present

## 2016-11-04 DIAGNOSIS — R079 Chest pain, unspecified: Secondary | ICD-10-CM | POA: Diagnosis not present

## 2016-11-04 DIAGNOSIS — Z79899 Other long term (current) drug therapy: Secondary | ICD-10-CM | POA: Diagnosis not present

## 2016-11-04 DIAGNOSIS — I10 Essential (primary) hypertension: Secondary | ICD-10-CM | POA: Insufficient documentation

## 2016-11-04 LAB — BASIC METABOLIC PANEL
Anion gap: 10 (ref 5–15)
BUN: 12 mg/dL (ref 6–20)
CO2: 25 mmol/L (ref 22–32)
Calcium: 8.8 mg/dL — ABNORMAL LOW (ref 8.9–10.3)
Chloride: 101 mmol/L (ref 101–111)
Creatinine, Ser: 0.75 mg/dL (ref 0.44–1.00)
GFR calc Af Amer: >60 mL/min (ref >60)
GFR calc non Af Amer: >60 mL/min (ref >60)
Glucose, Bld: 107 mg/dL — ABNORMAL HIGH (ref 65–99)
Potassium: 3.9 mmol/L (ref 3.5–5.1)
Sodium: 136 mmol/L (ref 135–145)

## 2016-11-04 LAB — CBC
HCT: 40 % (ref 35.0–47.0)
Hemoglobin: 13.3 g/dL (ref 12.0–16.0)
MCH: 32.8 pg (ref 26.0–34.0)
MCHC: 33.3 g/dL (ref 32.0–36.0)
MCV: 98.7 fL (ref 80.0–100.0)
Platelets: 377 10*3/uL (ref 150–440)
RBC: 4.05 MIL/uL (ref 3.80–5.20)
RDW: 14.5 % (ref 11.5–14.5)
WBC: 6.2 10*3/uL (ref 3.6–11.0)

## 2016-11-04 LAB — TROPONIN I
Troponin I: <0.03 ng/mL (ref <0.03)
Troponin I: <0.03 ng/mL (ref <0.03)

## 2016-11-04 MED ORDER — MORPHINE SULFATE (PF) 4 MG/ML IV SOLN
4.0000 mg | Freq: Once | INTRAVENOUS | Status: AC
  Administered 2016-11-04: 4 mg via INTRAVENOUS

## 2016-11-04 MED ORDER — IOPAMIDOL (ISOVUE-370) INJECTION 76%
75.0000 mL | Freq: Once | INTRAVENOUS | Status: AC | PRN
  Administered 2016-11-04: 75 mL via INTRAVENOUS

## 2016-11-04 MED ORDER — SODIUM CHLORIDE 0.9 % IV BOLUS (SEPSIS)
500.0000 mL | Freq: Once | INTRAVENOUS | Status: AC
  Administered 2016-11-04: 500 mL via INTRAVENOUS

## 2016-11-04 MED ORDER — ONDANSETRON HCL 4 MG/2ML IJ SOLN
4.0000 mg | Freq: Once | INTRAMUSCULAR | Status: AC
  Administered 2016-11-04: 4 mg via INTRAVENOUS

## 2016-11-04 MED ORDER — NITROGLYCERIN 0.4 MG SL SUBL
0.4000 mg | SUBLINGUAL_TABLET | SUBLINGUAL | Status: DC | PRN
  Administered 2016-11-04: 0.4 mg via SUBLINGUAL
  Filled 2016-11-04: qty 1

## 2016-11-04 MED ORDER — ASPIRIN 81 MG PO CHEW
324.0000 mg | CHEWABLE_TABLET | Freq: Once | ORAL | Status: AC
  Administered 2016-11-04: 324 mg via ORAL
  Filled 2016-11-04: qty 4

## 2016-11-04 MED ORDER — ONDANSETRON HCL 4 MG/2ML IJ SOLN
INTRAMUSCULAR | Status: AC
  Administered 2016-11-04: 4 mg via INTRAVENOUS
  Filled 2016-11-04: qty 2

## 2016-11-04 MED ORDER — MORPHINE SULFATE (PF) 4 MG/ML IV SOLN
INTRAVENOUS | Status: AC
  Administered 2016-11-04: 4 mg via INTRAVENOUS
  Filled 2016-11-04: qty 1

## 2016-11-04 NOTE — ED Provider Notes (Signed)
[facility] Emergency Department Provider Note  ____________________________________________  Time seen: Approximately 5:26 PM  I have reviewed the triage vital signs and the nursing notes.   HISTORY  Chief Complaint Chest Pain   HPI Linda Mejia is a 61 y.o. female with a history of coronary artery disease status post stents, hypertension, peptic ulcer disease, hyperlipidemia who presents for evaluation of chest pain. Patient last left heart catheterization was done 2 months agowhich showed 100% stenosis of proximal RCA, patent stand to the LAD, and 30-40% stenosis of other coronary arteries. Patient reports that an hour prior to arrival she was sitting in bed when she started having severe substernal chest pain that she describes as sharp and constant, nonradiating, associated with shortness of breath and dizziness. Patient denies ever having similar pain. She denies personal or family history blood clots, recent travel or immobilization, leg pain or swelling, hemoptysis, exogenous hormones. Pain is not pleuritic in nature. She denies paresthesias, numbness, weakness of her extremities. She denies nausea, vomiting, diaphoresis, palpitations.  Past Medical History:  Diagnosis Date  . ADHD (attention deficit hyperactivity disorder)   . Anxiety   . Chronic back pain   . Coronary artery disease   . Herpes genitalis in women   . History of stomach ulcers    x7 this year. Bleeding  . Hypertension     Patient Active Problem List   Diagnosis Date Noted  . Fracture of rib 08/28/2016  . Low back strain 08/28/2016  . Chest pain 08/11/2016  . Essential hypertension, malignant 08/11/2016  . Dizziness 08/11/2016  . Bilateral calf pain 08/11/2016  . Nausea & vomiting 07/29/2016  . Hx of tobacco use, presenting hazards to health 06/09/2016  . Encounter for HIV (human immunodeficiency virus) test 06/09/2016  . Preventative health care 06/09/2016  . Screen for STD  (sexually transmitted disease) 06/09/2016  . Degenerative arthritis of knee, bilateral 12/27/2015  . Obesity 12/27/2015  . GI bleed 09/21/2015  . Reflux esophagitis   . Other diseases of stomach and duodenum   . Duodenitis   . Subclinical hypothyroidism 06/06/2015  . Multiple joint pain 06/05/2015  . Other fatigue 06/05/2015  . Night sweats 06/05/2015  . Screening for tuberculosis 06/05/2015  . Headache 04/12/2015  . Subclinical hypothyroidism 03/26/2015  . Herpes simplex type 2 infection 03/21/2015  . Rhinitis, allergic 03/21/2015  . Cervical pain 09/21/2014  . ADD (attention deficit hyperactivity disorder, inattentive type) 09/20/2014  . Episodic paroxysmal anxiety disorder 09/20/2014  . History of gastric ulcer 09/20/2014  . Abuse, drug or alcohol 09/20/2014  . Breathlessness on exertion 08/28/2014  . HLD (hyperlipidemia) 10/03/2009  . Coronary artery disease involving native coronary artery of native heart with angina pectoris ([place]) 10/02/2009  . Hypertension goal BP (blood pressure) < 140/90 10/02/2009    Past Surgical History:  Procedure Laterality Date  . CARDIAC CATHETERIZATION    . CESAREAN SECTION    . ESOPHAGOGASTRODUODENOSCOPY (EGD) WITH PROPOFOL N/A 09/21/2015   Procedure: ESOPHAGOGASTRODUODENOSCOPY (EGD) WITH PROPOFOL;  Surgeon: Lucilla Lame, MD;  Location: [facility] ENDOSCOPY;  Service: Endoscopy;  Laterality: N/A;  . ESOPHAGOGASTRODUODENOSCOPY (EGD) WITH PROPOFOL N/A 07/30/2016   Procedure: ESOPHAGOGASTRODUODENOSCOPY (EGD) WITH PROPOFOL;  Surgeon: Jonathon Bellows, MD;  Location: [facility] ENDOSCOPY;  Service: Endoscopy;  Laterality: N/A;  . LEFT HEART CATH AND CORONARY ANGIOGRAPHY Right 08/12/2016   Procedure: Left Heart Cath and Coronary Angiography;  Surgeon: Dionisio David, MD;  Location: [place] CV LAB;  Service: Cardiovascular;  Laterality: Right;  .  TONSILLECTOMY    . TUBAL LIGATION      Prior to Admission medications   Medication Sig Start Date End Date Taking?  Authorizing Provider  acyclovir (ZOVIRAX) 400 MG tablet take 1 tablet by mouth twice a day 08/29/16   Arnetha Courser, MD  amphetamine-dextroamphetamine (ADDERALL XR) 30 MG 24 hr capsule Amphetamine Salt Combo 30 mg tablet    [provider]  amphetamine-dextroamphetamine (ADDERALL) 30 MG tablet Take 30 mg by mouth 2 (two) times daily.    [provider]  aspirin EC 81 MG tablet Take 81 mg by mouth daily.    [provider]  [place] 5-2.5-18.5 injection  06/09/16   [provider]  diazepam (VALIUM) 10 MG tablet Take 1 tablet (10 mg total) by mouth every 8 (eight) hours as needed for anxiety. 09/22/15   Fritzi Mandes, MD  fluticasone (FLONASE) 50 MCG/ACT nasal spray Place 2 sprays into both nostrils daily. 06/09/16   Arnetha Courser, MD  lisinopril (PRINIVIL,ZESTRIL) 5 MG tablet TAKE 1 TABLET BY MOUTH ONCE A DAY 10/23/16   Arnetha Courser, MD  LIVALO 4 MG TABS  07/09/16   [provider]  metoprolol (LOPRESSOR) 50 MG tablet Take 1 tablet (50 mg total) by mouth 2 (two) times daily. 06/09/16   Arnetha Courser, MD  omeprazole (PRILOSEC) 20 MG capsule Take 1 capsule (20 mg total) by mouth 2 (two) times daily before a meal. 08/01/16 08/15/16  Jonathon Bellows, MD  pantoprazole (PROTONIX) 40 MG tablet Take 1 tablet (40 mg total) by mouth daily. 07/30/16   Bettey Costa, MD  Pitavastatin Calcium (LIVALO) 2 MG TABS One by mouth daily at bedtime (for cholesterol) Patient taking differently: Take 1 tablet by mouth daily. One by mouth daily at bedtime (for cholesterol) 07/08/16   Lada, Satira Anis, MD  zolpidem (AMBIEN) 10 MG tablet Take 10 mg by mouth at bedtime as needed.      [provider]    Allergies Patient has no known allergies.  Family History  Problem Relation Age of Onset  . Diabetes Mother   . CVA Mother   . Heart disease Father   . Heart disease Sister   . Heart disease Brother   . Heart disease Sister   . Heart disease Sister   . Heart disease Sister    . Heart disease Brother   . Heart disease Brother   . Heart disease Brother     Social History Social History  Substance Use Topics  . Smoking status: Former Smoker    Packs/day: 1.00    Years: 30.00  . Smokeless tobacco: Never Used  . Alcohol use Yes     Comment: occasionally    Review of Systems  Constitutional: Negative for fever. + Lightheadedness Eyes: Negative for visual changes. ENT: Negative for sore throat. Neck: No neck pain  Cardiovascular: + chest pain. Respiratory: + shortness of breath. Gastrointestinal: Negative for abdominal pain, vomiting or diarrhea. Genitourinary: Negative for dysuria. Musculoskeletal: Negative for back pain. Skin: Negative for rash. Neurological: Negative for headaches, weakness or numbness. Psych: No SI or HI  ____________________________________________   PHYSICAL EXAM:  VITAL SIGNS: ED Triage Vitals  Enc Vitals Group     BP 11/04/16 1407 (!) 180/104     Pulse Rate 11/04/16 1407 64     Resp 11/04/16 1407 19     Temp 11/04/16 1407 98.7 F (37.1 C)     Temp Source 11/04/16 1407 Oral  SpO2 11/04/16 1407 96 %     Weight 11/04/16 1408 195 lb (88.5 kg)     Height 11/04/16 1408 5\' 5"  (1.651 m)     Head Circumference --      Peak Flow --      Pain Score 11/04/16 1406 8     Pain Loc --      Pain Edu? --      Excl. in [place]? --     Constitutional: Alert and oriented. Well appearing and in no apparent distress. HEENT:      Head: Normocephalic and atraumatic.         Eyes: Conjunctivae are normal. Sclera is non-icteric.       Mouth/Throat: Mucous membranes are moist.       Neck: Supple with no signs of meningismus. Cardiovascular: Regular rate and rhythm. No murmurs, gallops, or rubs. 2+ symmetrical distal pulses are present in all extremities. No JVD. Respiratory: Normal respiratory effort. Lungs are clear to auscultation bilaterally. No wheezes, crackles, or rhonchi.  Gastrointestinal: Soft, non tender, and non distended  with positive bowel sounds. No rebound or guarding. Musculoskeletal: Nontender with normal range of motion in all extremities. No edema, cyanosis, or erythema of extremities. Neurologic: Normal speech and language. Face is symmetric. Moving all extremities. No gross focal neurologic deficits are appreciated. Skin: Skin is warm, dry and intact. No rash noted. Psychiatric: Mood and affect are normal. Speech and behavior are normal.  ____________________________________________   LABS (all labs ordered are listed, but only abnormal results are displayed)  Labs Reviewed  BASIC METABOLIC PANEL - Abnormal; Notable for the following:       Result Value   Glucose, Bld 107 (*)    Calcium 8.8 (*)    All other components within normal limits  CBC  TROPONIN I  TROPONIN I   ____________________________________________  EKG  ED ECG REPORT I, Rudene Re, the attending physician, personally viewed and interpreted this ECG.  14:18 - normal sinus rhythm, rate of 67, normal intervals, normal axis, no ST elevations or depressions, T-wave inversion in lead 3  16:48 - normal sinus rhythm, rate of 91, normal intervals, normal axis, no ST elevations or depressions, persistent T wave inversion in lead 3. ____________________________________________  RADIOLOGY  CXR: negative  CTA chest:  1. No pulmonary embolus or acute intrathoracic abnormality. 2. Aortic atherosclerosis. Coronary artery calcifications/stents. ____________________________________________   PROCEDURES  Procedure(s) performed: None Procedures Critical Care performed:  None ____________________________________________   INITIAL IMPRESSION / ASSESSMENT AND PLAN / ED COURSE  61 y.o. female with a history of coronary artery disease status post stents, hypertension, peptic ulcer disease, hyperlipidemia who presents for evaluation of chest pain constant for several hours associated with shortness of breath and dizziness. 2  EKGs with no evidence of ischemia. First troponin is negative. Pain was unchanged with sublingual nitroglycerin. Patient will be sent for CTA to rule out PE or dissection. She was given aspirin.  ----------------------------------------- 4:40 PM on 11/04/2016 -----------------------------------------   OBSERVATION CARE: This patient is being placed under observation care for the following reasons: Chest pain with repeat testing to rule out ischemia   ----------------------------------------- 6:20 PM on 11/04/2016 ----------------------------------------- Patient is pain free after morphine and remains well appearing. Repeat EKG with no ischemic changes. Will continue to monitor. CTA pending. 2nd troponin pending   ----------------------------------------- 7:01 PM on 11/04/2016 -----------------------------------------   END OF OBSERVATION STATUS: After an appropriate period of observation, this patient is being discharged due to the following reason(s):  Clinical Course as of Nov 04 1900  Tue Nov 04, 2016  1859 Patient reports full resolution of her chest pain after receiving morphine. CTA is negative for PE, dissection, pneumothorax, or pneumonia. Troponin 2 is negative. We'll discharge home with close follow-up with patient's cardiologist Dr. Chancy Milroy in 24-48 hours for reevaluation.  [CV]    Clinical Course User Index [CV] Rudene Re, MD    Pertinent labs & imaging results that were available during my care of the patient were reviewed by me and considered in my medical decision making (see chart for details).    ____________________________________________   FINAL CLINICAL IMPRESSION(S) / ED DIAGNOSES  Final diagnoses:  Chest pain, unspecified type      NEW MEDICATIONS STARTED DURING THIS VISIT:  New Prescriptions   No medications on file     Note:  This document was prepared using Dragon voice recognition software and may include unintentional  dictation errors.    Alfred Levins, [place], MD 11/04/16 Lurline Hare

## 2016-11-04 NOTE — ED Triage Notes (Signed)
Pt reports that she began having stabbing left sided chest pain about 2 hrs pta. Pt reports sob with pain.

## 2016-11-04 NOTE — ED Notes (Signed)
Pt ambulatory to toilet by self.  

## 2016-11-27 ENCOUNTER — Ambulatory Visit: Payer: Medicare Other | Admitting: Family Medicine

## 2016-12-02 ENCOUNTER — Encounter: Payer: Self-pay | Admitting: Family Medicine

## 2016-12-02 ENCOUNTER — Ambulatory Visit (INDEPENDENT_AMBULATORY_CARE_PROVIDER_SITE_OTHER): Payer: Medicare Other | Admitting: Family Medicine

## 2016-12-02 DIAGNOSIS — E782 Mixed hyperlipidemia: Secondary | ICD-10-CM | POA: Diagnosis not present

## 2016-12-02 DIAGNOSIS — E6609 Other obesity due to excess calories: Secondary | ICD-10-CM | POA: Diagnosis not present

## 2016-12-02 DIAGNOSIS — R6889 Other general symptoms and signs: Secondary | ICD-10-CM | POA: Diagnosis not present

## 2016-12-02 DIAGNOSIS — R7303 Prediabetes: Secondary | ICD-10-CM | POA: Diagnosis not present

## 2016-12-02 DIAGNOSIS — I2510 Atherosclerotic heart disease of native coronary artery without angina pectoris: Secondary | ICD-10-CM | POA: Diagnosis not present

## 2016-12-02 DIAGNOSIS — I700 Atherosclerosis of aorta: Secondary | ICD-10-CM

## 2016-12-02 DIAGNOSIS — Z6833 Body mass index (BMI) 33.0-33.9, adult: Secondary | ICD-10-CM

## 2016-12-02 DIAGNOSIS — I10 Essential (primary) hypertension: Secondary | ICD-10-CM | POA: Diagnosis not present

## 2016-12-02 HISTORY — DX: Prediabetes: R73.03

## 2016-12-02 HISTORY — DX: Other general symptoms and signs: R68.89

## 2016-12-02 HISTORY — DX: Atherosclerosis of aorta: I70.0

## 2016-12-02 NOTE — Progress Notes (Signed)
BP 138/72   Pulse 76   Temp 98.1 F (36.7 C) (Oral)   Resp 14   Wt 199 lb 8 oz (90.5 kg)   SpO2 94%   BMI 33.20 kg/m    Subjective:    Patient ID: Linda Mejia, female    DOB: 05/18/1955, 61 y.o.   MRN: [id number]  HPI: Linda Mejia is a 61 y.o. female  Chief Complaint  Patient presents with  . ER Followup    Chest Pains    HPI Patient is here for f/u She was in the ER on July 24th She had sharp pain, stabbing, came out of nowhere she says She saw Dr. Humphrey Rolls and he told her that her cholesterol was off the chart; cannot take statins; he ordered her a new medicine; she has taken it twice, due for next injection on Friday She is only on the short-acting adderall Not taking ACE-I Going to have echo soon She has seen a lot on the news about PPI She will use the acid medicine just here and there if trigger foods Nurse went out and evaluated her yesterday; she did the blood pressure test in her legs; blockage in her left leg She told her to keep exercising She'll ask Dr. Humphrey Rolls about it next month; would prefer to lose weight and exercise; walking Cr 0.75 on November 04, 2016 CBC was normal Dr. Humphrey Rolls is monitoring her cholesterol; he got the Praluent approved  Chest CT done in the ER IMPRESSION: 1. No pulmonary embolus or acute intrathoracic abnormality. 2. Aortic atherosclerosis.  Coronary artery calcifications/stents.  Aortic Atherosclerosis (ICD10-I70.0).   Electronically Signed   By: Jeb Levering M.D.   On: 11/04/2016 18:27  Aortic athero and coronary artery Ca2+ discussed She has a pinched nerve in her neck; patient asked about getting a prescription for Fioricet; Dr. Ronnald Ramp wanted to do surgery; I offered physical therapy; neck and lower back; has the artrhitis in her knees  Depression screen [facility] 2/9 12/02/2016 06/09/2016 12/27/2015 03/21/2015  Decreased Interest 0 0 0 3  Down, Depressed, Hopeless 0 0 3 3  PHQ - 2 Score 0 0 3 6  Altered sleeping - - 1 3  Tired,  decreased energy - - 3 3  Change in appetite - - 1 1  Feeling bad or failure about yourself  - - 2 3  Trouble concentrating - - 1 1  Moving slowly or fidgety/restless - - 1 0  Suicidal thoughts - - 1 0  PHQ-9 Score - - 13 17  Difficult doing work/chores - - Very difficult Very difficult    Relevant past medical, surgical, family and social history reviewed Past Medical History:  Diagnosis Date  . Abnormal ankle brachial index (ABI) 12/02/2016  . ADHD (attention deficit hyperactivity disorder)   . Anxiety   . Aortic atherosclerosis ([place]) 12/02/2016   July 2018  . Chronic back pain   . Coronary artery disease   . Herpes genitalis in women   . History of stomach ulcers    x7 this year. Bleeding  . Hypertension   . Prediabetes 12/02/2016   Past Surgical History:  Procedure Laterality Date  . CARDIAC CATHETERIZATION    . CESAREAN SECTION    . ESOPHAGOGASTRODUODENOSCOPY (EGD) WITH PROPOFOL N/A 09/21/2015   Procedure: ESOPHAGOGASTRODUODENOSCOPY (EGD) WITH PROPOFOL;  Surgeon: Lucilla Lame, MD;  Location: [facility] ENDOSCOPY;  Service: Endoscopy;  Laterality: N/A;  . ESOPHAGOGASTRODUODENOSCOPY (EGD) WITH PROPOFOL N/A 07/30/2016   Procedure: ESOPHAGOGASTRODUODENOSCOPY (EGD) WITH  PROPOFOL;  Surgeon: Jonathon Bellows, MD;  Location: [facility] ENDOSCOPY;  Service: Endoscopy;  Laterality: N/A;  . LEFT HEART CATH AND CORONARY ANGIOGRAPHY Right 08/12/2016   Procedure: Left Heart Cath and Coronary Angiography;  Surgeon: Dionisio David, MD;  Location: [place] CV LAB;  Service: Cardiovascular;  Laterality: Right;  . TONSILLECTOMY    . TUBAL LIGATION     Family History  Problem Relation Age of Onset  . Diabetes Mother   . CVA Mother   . Heart disease Father   . Heart disease Sister   . Heart disease Brother   . Heart disease Sister   . Heart disease Sister   . Heart disease Sister   . Heart disease Brother   . Heart disease Brother   . Heart disease Brother    Social History   Social History  .  Marital status: Widowed    Spouse name: N/A  . Number of children: N/A  . Years of education: N/A   Occupational History  . disabled    Social History Main Topics  . Smoking status: Former Smoker    Packs/day: 1.00    Years: 30.00  . Smokeless tobacco: Never Used  . Alcohol use Yes     Comment: occasionally  . Drug use: No  . Sexual activity: Yes    Birth control/ protection: None   Other Topics Concern  . Not on file   Social History Narrative  . No narrative on file    Interim medical history since last visit reviewed. Allergies and medications reviewed  Review of Systems Per HPI unless specifically indicated above     Objective:    BP 138/72   Pulse 76   Temp 98.1 F (36.7 C) (Oral)   Resp 14   Wt 199 lb 8 oz (90.5 kg)   SpO2 94%   BMI 33.20 kg/m   Wt Readings from Last 3 Encounters:  12/02/16 199 lb 8 oz (90.5 kg)  11/04/16 195 lb (88.5 kg)  07/29/16 201 lb 11.5 oz (91.5 kg)    Physical Exam  Constitutional: She appears well-developed and well-nourished. No distress.  HENT:  Head: Normocephalic and atraumatic.  Eyes: EOM are normal. No scleral icterus.  Neck: No thyromegaly present.  Cardiovascular: Normal rate, regular rhythm and normal heart sounds.   No murmur heard. Pulmonary/Chest: Effort normal and breath sounds normal. No respiratory distress. She has no wheezes.  Abdominal: Soft. Bowel sounds are normal. She exhibits no distension.  Musculoskeletal: She exhibits no edema.  Neurological: She is alert.  Skin: Skin is warm and dry. She is not diaphoretic. No pallor.  Psychiatric: She has a normal mood and affect. Her behavior is normal. Judgment and thought content normal.    Results for orders placed or performed during the hospital encounter of 79/89/21  Basic metabolic panel  Result Value Ref Range   Sodium 136 135 - 145 mmol/L   Potassium 3.9 3.5 - 5.1 mmol/L   Chloride 101 101 - 111 mmol/L   CO2 25 22 - 32 mmol/L   Glucose, Bld 107  (H) 65 - 99 mg/dL   BUN 12 6 - 20 mg/dL   Creatinine, Ser 0.75 0.44 - 1.00 mg/dL   Calcium 8.8 (L) 8.9 - 10.3 mg/dL   GFR calc non Af Amer >60 >60 mL/min   GFR calc Af Amer >60 >60 mL/min   Anion gap 10 5 - 15  CBC  Result Value Ref Range   WBC 6.2  3.6 - 11.0 K/uL   RBC 4.05 3.80 - 5.20 MIL/uL   Hemoglobin 13.3 12.0 - 16.0 g/dL   HCT 40.0 35.0 - 47.0 %   MCV 98.7 80.0 - 100.0 fL   MCH 32.8 26.0 - 34.0 pg   MCHC 33.3 32.0 - 36.0 g/dL   RDW 14.5 11.5 - 14.5 %   Platelets 377 150 - 440 K/uL  Troponin I  Result Value Ref Range   Troponin I <0.03 <0.03 ng/mL  Troponin I  Result Value Ref Range   Troponin I <0.03 <0.03 ng/mL      Assessment & Plan:   Problem List Items Addressed This Visit      Cardiovascular and Mediastinum   Hypertension goal BP (blood pressure) < 140/90    Controlled today; try to follow DASH guidelines      Relevant Medications   PRALUENT 75 MG/ML SOPN   Coronary artery calcification seen on CT scan    Managed by Dr. Humphrey Rolls; on aspirin; on Praluent; as LDL comes down, expect improvement      Relevant Medications   PRALUENT 75 MG/ML SOPN   Aortic atherosclerosis ([place])    On aspirin and Praluent; intolerant to statins; follow-up with Dr. Humphrey Rolls      Relevant Medications   PRALUENT 75 MG/ML SOPN     Other   Prediabetes    Work on weight loss and healthy eating and walking; see AVS; check A1c in late October or November      Obesity    Down 2+ pounds since April; she'll work on building up activity very gradually      Relevant Medications   amphetamine-dextroamphetamine (ADDERALL) 30 MG tablet   HLD (hyperlipidemia)    Intolerant to statins; on Praluent from cardiologist; he monitors      Relevant Medications   PRALUENT 75 MG/ML SOPN   Abnormal ankle brachial index (ABI)    Continue aspirin, Praluent; build up walking gradually; she'll talk to Dr. Humphrey Rolls about this instead of seeing vascular doctor quite yet          Follow up  plan: Return in about 2 months (around 02/11/2017) for twenty minute follow-up with fasting labs.  An after-visit summary was printed and given to the patient at [place].  Please see the patient instructions which may contain other information and recommendations beyond what is mentioned above in the assessment and plan.  Meds ordered this encounter  Medications  . PRALUENT 75 MG/ML SOPN    Sig: Inject 75 mLs as directed every 14 (fourteen) days.  Marland Kitchen amphetamine-dextroamphetamine (ADDERALL) 30 MG tablet    Sig: Take 1 tablet by mouth 2 (two) times daily.    No orders of the defined types were placed in this encounter.

## 2016-12-02 NOTE — Assessment & Plan Note (Signed)
Continue aspirin, Praluent; build up walking gradually; she'll talk to Dr. Humphrey Rolls about this instead of seeing vascular doctor quite yet

## 2016-12-02 NOTE — Assessment & Plan Note (Signed)
Intolerant to statins; on Praluent from cardiologist; he monitors

## 2016-12-02 NOTE — Assessment & Plan Note (Signed)
Work on weight loss and healthy eating and walking; see AVS; check A1c in late October or November

## 2016-12-02 NOTE — Assessment & Plan Note (Signed)
On aspirin and Praluent; intolerant to statins; follow-up with Dr. Humphrey Rolls

## 2016-12-02 NOTE — Assessment & Plan Note (Signed)
Controlled today; try to follow DASH guidelines 

## 2016-12-02 NOTE — Assessment & Plan Note (Signed)
Managed by Dr. Humphrey Rolls; on aspirin; on Praluent; as LDL comes down, expect improvement

## 2016-12-02 NOTE — Patient Instructions (Addendum)
Let me know if you change your mind and you'd like the referral to physical therapy for your neck Try to limit saturated fats in your diet (bologna, hot dogs, barbeque, cheeseburgers, hamburgers, steak, bacon, sausage, cheese, etc.) and get more fresh fruits, vegetables, and whole grains Quinoa is a complete protein and you can cook that and use as your meat substitute    DASH Eating Plan DASH stands for "Dietary Approaches to Stop Hypertension." The DASH eating plan is a healthy eating plan that has been shown to reduce high blood pressure (hypertension). It may also reduce your risk for type 2 diabetes, heart disease, and stroke. The DASH eating plan may also help with weight loss. What are tips for following this plan? General guidelines  Avoid eating more than 2,300 mg (milligrams) of salt (sodium) a day. If you have hypertension, you may need to reduce your sodium intake to 1,500 mg a day.  Limit alcohol intake to no more than 1 drink a day for nonpregnant women and 2 drinks a day for men. One drink equals 12 oz of beer, 5 oz of wine, or 1 oz of hard liquor.  Work with your health care provider to maintain a healthy body weight or to lose weight. Ask what an ideal weight is for you.  Get at least 30 minutes of exercise that causes your heart to beat faster (aerobic exercise) most days of the week. Activities may include walking, swimming, or biking.  Work with your health care provider or diet and nutrition specialist (dietitian) to adjust your eating plan to your individual calorie needs. Reading food labels  Check food labels for the amount of sodium per serving. Choose foods with less than 5 percent of the Daily Value of sodium. Generally, foods with less than 300 mg of sodium per serving fit into this eating plan.  To find whole grains, look for the word "whole" as the first word in the ingredient list. Shopping  Buy products labeled as "low-sodium" or "no salt added."  Buy  fresh foods. Avoid canned foods and premade or frozen meals. Cooking  Avoid adding salt when cooking. Use salt-free seasonings or herbs instead of table salt or sea salt. Check with your health care provider or pharmacist before using salt substitutes.  Do not fry foods. Cook foods using healthy methods such as baking, boiling, grilling, and broiling instead.  Cook with heart-healthy oils, such as olive, canola, soybean, or sunflower oil. Meal planning   Eat a balanced diet that includes: ? 5 or more servings of fruits and vegetables each day. At each meal, try to fill half of your plate with fruits and vegetables. ? Up to 6-8 servings of whole grains each day. ? Less than 6 oz of lean meat, poultry, or fish each day. A 3-oz serving of meat is about the same size as a deck of cards. One egg equals 1 oz. ? 2 servings of low-fat dairy each day. ? A serving of nuts, seeds, or beans 5 times each week. ? Heart-healthy fats. Healthy fats called Omega-3 fatty acids are found in foods such as flaxseeds and coldwater fish, like sardines, salmon, and mackerel.  Limit how much you eat of the following: ? Canned or prepackaged foods. ? Food that is high in trans fat, such as fried foods. ? Food that is high in saturated fat, such as fatty meat. ? Sweets, desserts, sugary drinks, and other foods with added sugar. ? Full-fat dairy products.  Do not  salt foods before eating.  Try to eat at least 2 vegetarian meals each week.  Eat more home-cooked food and less restaurant, buffet, and fast food.  When eating at a restaurant, ask that your food be prepared with less salt or no salt, if possible. What foods are recommended? The items listed may not be a complete list. Talk with your dietitian about what dietary choices are best for you. Grains Whole-grain or whole-wheat bread. Whole-grain or whole-wheat pasta. Brown rice. [place]. Bulgur. Whole-grain and low-sodium cereals. Pita bread.  Low-fat, low-sodium crackers. Whole-wheat flour tortillas. Vegetables Fresh or frozen vegetables (raw, steamed, roasted, or grilled). Low-sodium or reduced-sodium tomato and vegetable juice. Low-sodium or reduced-sodium tomato sauce and tomato paste. Low-sodium or reduced-sodium canned vegetables. Fruits All fresh, dried, or frozen fruit. Canned fruit in natural juice (without added sugar). Meat and other protein foods Skinless chicken or [place]. Ground chicken or [place]. Pork with fat trimmed off. Fish and seafood. Egg whites. Dried beans, peas, or lentils. Unsalted nuts, nut butters, and seeds. Unsalted canned beans. Lean cuts of beef with fat trimmed off. Low-sodium, lean deli meat. Dairy Low-fat (1%) or fat-free (skim) milk. Fat-free, low-fat, or reduced-fat cheeses. Nonfat, low-sodium ricotta or cottage cheese. Low-fat or nonfat yogurt. Low-fat, low-sodium cheese. Fats and oils Soft margarine without trans fats. Vegetable oil. Low-fat, reduced-fat, or light mayonnaise and salad dressings (reduced-sodium). Canola, safflower, olive, soybean, and sunflower oils. Avocado. Seasoning and other foods Herbs. Spices. Seasoning mixes without salt. Unsalted popcorn and pretzels. Fat-free sweets. What foods are not recommended? The items listed may not be a complete list. Talk with your dietitian about what dietary choices are best for you. Grains Baked goods made with fat, such as croissants, muffins, or some breads. Dry pasta or rice meal packs. Vegetables Creamed or fried vegetables. Vegetables in a cheese sauce. Regular canned vegetables (not low-sodium or reduced-sodium). Regular canned tomato sauce and paste (not low-sodium or reduced-sodium). Regular tomato and vegetable juice (not low-sodium or reduced-sodium). Angie Fava. Olives. Fruits Canned fruit in a light or heavy syrup. Fried fruit. Fruit in cream or butter sauce. Meat and other protein foods Fatty cuts of meat. Ribs. Fried meat. Berniece Salines.  Sausage. Bologna and other processed lunch meats. Salami. Fatback. Hotdogs. Bratwurst. Salted nuts and seeds. Canned beans with added salt. Canned or smoked fish. Whole eggs or egg yolks. Chicken or [place] with skin. Dairy Whole or 2% milk, cream, and half-and-half. Whole or full-fat cream cheese. Whole-fat or sweetened yogurt. Full-fat cheese. Nondairy creamers. Whipped toppings. Processed cheese and cheese spreads. Fats and oils Butter. Stick margarine. Lard. Shortening. Ghee. Bacon fat. Tropical oils, such as coconut, palm kernel, or palm oil. Seasoning and other foods Salted popcorn and pretzels. Onion salt, garlic salt, seasoned salt, table salt, and sea salt. Worcestershire sauce. Tartar sauce. Barbecue sauce. Teriyaki sauce. Soy sauce, including reduced-sodium. Steak sauce. Canned and packaged gravies. Fish sauce. Oyster sauce. Cocktail sauce. Horseradish that you find on the shelf. Ketchup. Mustard. Meat flavorings and tenderizers. Bouillon cubes. Hot sauce and Tabasco sauce. Premade or packaged marinades. Premade or packaged taco seasonings. Relishes. Regular salad dressings. Where to find more information:  National Heart, Lung, and [place]: https://wilson-eaton.com/  American Heart Association: www.heart.org Summary  The DASH eating plan is a healthy eating plan that has been shown to reduce high blood pressure (hypertension). It may also reduce your risk for type 2 diabetes, heart disease, and stroke.  With the DASH eating plan, you should limit salt (sodium) intake to  2,300 mg a day. If you have hypertension, you may need to reduce your sodium intake to 1,500 mg a day.  When on the DASH eating plan, aim to eat more fresh fruits and vegetables, whole grains, lean proteins, low-fat dairy, and heart-healthy fats.  Work with your health care provider or diet and nutrition specialist (dietitian) to adjust your eating plan to your individual calorie needs. This information is not intended  to replace advice given to you by your health care provider. Make sure you discuss any questions you have with your health care provider. Document Released: 03/20/2011 Document Revised: 03/24/2016 Document Reviewed: 03/24/2016 Elsevier Interactive Patient Education  2017 Elsevier Inc.  Prediabetes Prediabetes is the condition of having a blood sugar (blood glucose) level that is higher than it should be, but not high enough for you to be diagnosed with type 2 diabetes. Having prediabetes puts you at risk for developing type 2 diabetes (type 2 diabetes mellitus). Prediabetes may be called impaired glucose tolerance or impaired fasting glucose. Prediabetes usually does not cause symptoms. Your health care provider can diagnose this condition with blood tests. You may be tested for prediabetes if you are overweight and if you have at least one other risk factor for prediabetes. Risk factors for prediabetes include:  Having a family member with type 2 diabetes.  Being overweight or obese.  Being older than age 69.  Being of American-Indian, African-American, Hispanic/Latino, or Asian/Pacific Islander descent.  Having an inactive (sedentary) lifestyle.  Having a history of gestational diabetes or polycystic ovarian syndrome (PCOS).  Having low levels of good cholesterol (HDL-C) or high levels of blood fats (triglycerides).  Having high blood pressure.  What is blood glucose and how is blood glucose measured?  Blood glucose refers to the amount of glucose in your bloodstream. Glucose comes from eating foods that contain sugars and starches (carbohydrates) that the body breaks down into glucose. Your blood glucose level may be measured in mg/dL (milligrams per deciliter) or mmol/L (millimoles per liter).Your blood glucose may be checked with one or more of the following blood tests:  A fasting blood glucose (FBG) test. You will not be allowed to eat (you will fast) for at least 8 hours before  a blood sample is taken. ? A normal range for FBG is 70-100 mg/dl (3.9-5.6 mmol/L).  An A1c (hemoglobin A1c) blood test. This test provides information about blood glucose control over the previous 2?62months.  An oral glucose tolerance test (OGTT). This test measures your blood glucose twice: ? After fasting. This is your baseline level. ? Two hours after you drink a beverage that contains glucose.  You may be diagnosed with prediabetes:  If your FBG is 100?125 mg/dL (5.6-6.9 mmol/L).  If your A1c level is 5.7?6.4%.  If your OGGT result is 140?199 mg/dL (7.8-11 mmol/L).  These blood tests may be repeated to confirm your diagnosis. What happens if blood glucose is too high? The pancreas produces a hormone (insulin) that helps move glucose from the bloodstream into cells. When cells in the body do not respond properly to insulin that the body makes (insulin resistance), excess glucose builds up in the blood instead of going into cells. As a result, high blood glucose (hyperglycemia) can develop, which can cause many complications. This is a symptom of prediabetes. What can happen if blood glucose stays higher than normal for a long time? Having high blood glucose for a long time is dangerous. Too much glucose in your blood can damage your  nerves and blood vessels. Long-term damage can lead to complications from diabetes, which may include:  Heart disease.  Stroke.  Blindness.  Kidney disease.  Depression.  Poor circulation in the feet and legs, which could lead to surgical removal (amputation) in severe cases.  How can prediabetes be prevented from turning into type 2 diabetes?  To help prevent type 2 diabetes, take the following actions:  Be physically active. ? Do moderate-intensity physical activity for at least 30 minutes on at least 5 days of the week, or as much as told by your health care provider. This could be brisk walking, biking, or water aerobics. ? Ask your  health care provider what activities are safe for you. A mix of physical activities may be best, such as walking, swimming, cycling, and strength training.  Lose weight as told by your health care provider. ? Losing 5-7% of your body weight can reverse insulin resistance. ? Your health care provider can determine how much weight loss is best for you and can help you lose weight safely.  Follow a healthy meal plan. This includes eating lean proteins, complex carbohydrates, fresh fruits and vegetables, low-fat dairy products, and healthy fats. ? Follow instructions from your health care provider about eating or drinking restrictions. ? Make an appointment to see a diet and nutrition specialist (registered dietitian) to help you create a healthy eating plan that is right for you.  Do not smoke or use any tobacco products, such as cigarettes, chewing tobacco, and e-cigarettes. If you need help quitting, ask your health care provider.  Take over-the-counter and prescription medicines as told by your health care provider. You may be prescribed medicines that help lower the risk of type 2 diabetes.  This information is not intended to replace advice given to you by your health care provider. Make sure you discuss any questions you have with your health care provider. Document Released: 07/23/2015 Document Revised: 09/06/2015 Document Reviewed: 05/22/2015 Elsevier Interactive Patient Education  [employer].

## 2016-12-02 NOTE — Assessment & Plan Note (Signed)
Down 2+ pounds since April; she'll work on building up activity very gradually

## 2016-12-22 ENCOUNTER — Encounter: Payer: Self-pay | Admitting: Family Medicine

## 2016-12-22 ENCOUNTER — Ambulatory Visit (INDEPENDENT_AMBULATORY_CARE_PROVIDER_SITE_OTHER): Payer: Medicare Other | Admitting: Family Medicine

## 2016-12-22 VITALS — BP 138/80 | HR 79 | Temp 97.9°F | Resp 16 | Ht 65.0 in | Wt 205.4 lb

## 2016-12-22 DIAGNOSIS — N3001 Acute cystitis with hematuria: Secondary | ICD-10-CM

## 2016-12-22 LAB — POCT URINALYSIS DIPSTICK
Bilirubin, UA: NEGATIVE
Glucose, UA: NEGATIVE
Ketones, UA: NEGATIVE
Nitrite, UA: NEGATIVE
Spec Grav, UA: <=1.005 — AB (ref 1.010–1.025)
Urobilinogen, UA: 0.2 E.U./dL
pH, UA: 5 (ref 5.0–8.0)

## 2016-12-22 MED ORDER — NITROFURANTOIN MONOHYD MACRO 100 MG PO CAPS: 100.0000 mg | ORAL_CAPSULE | Freq: Two times a day (BID) | ORAL | 0 refills | Status: AC

## 2016-12-22 NOTE — Progress Notes (Addendum)
Name: Linda Mejia   MRN: [id number]    DOB: 1955/09/08   Date:12/22/2016       Progress Note  Subjective  Chief Complaint  Chief Complaint  Patient presents with  . Urinary Tract Infection    burning    HPI  Patient presents with two days of dysuria, urgency, and chills. No frequency, fevers, abdominal pain, NVD, flank pain or back pain.  No history of kidney stones.  [facility] 12[facility] Azithromycin x1 dose in the office and was feeling fine after this treatment until two days ago when new symptoms started.  Patient Active Problem List   Diagnosis Date Noted  . Coronary artery calcification seen on CT scan 12/02/2016  . Aortic atherosclerosis ([place]) 12/02/2016  . Abnormal ankle brachial index (ABI) 12/02/2016  . Prediabetes 12/02/2016  . Fracture of rib 08/28/2016  . Low back strain 08/28/2016  . Chest pain 08/11/2016  . Dizziness 08/11/2016  . Nausea & vomiting 07/29/2016  . Hx of tobacco use, presenting hazards to health 06/09/2016  . Encounter for HIV (human immunodeficiency virus) test 06/09/2016  . Degenerative arthritis of knee, bilateral 12/27/2015  . Obesity 12/27/2015  . GI bleed 09/21/2015  . Reflux esophagitis   . Other diseases of stomach and duodenum   . Duodenitis   . Subclinical hypothyroidism 06/06/2015  . Multiple joint pain 06/05/2015  . Night sweats 06/05/2015  . Headache 04/12/2015  . Subclinical hypothyroidism 03/26/2015  . Herpes simplex type 2 infection 03/21/2015  . Rhinitis, allergic 03/21/2015  . Cervical pain 09/21/2014  . ADD (attention deficit hyperactivity disorder, inattentive type) 09/20/2014  . Episodic paroxysmal anxiety disorder 09/20/2014  . History of gastric ulcer 09/20/2014  . Abuse, drug or alcohol 09/20/2014  . HLD (hyperlipidemia) 10/03/2009  . Coronary artery disease involving native coronary artery of native heart with angina pectoris ([place]) 10/02/2009  .  Hypertension goal BP (blood pressure) < 140/90 10/02/2009    Social History  Substance Use Topics  . Smoking status: Former Smoker    Packs/day: 1.00    Years: 30.00  . Smokeless tobacco: Never Used  . Alcohol use Yes     Comment: occasionally     Current Outpatient Prescriptions:  .  acyclovir (ZOVIRAX) 400 MG tablet, take 1 tablet by mouth twice a day, Disp: 60 tablet, Rfl: 5 .  amphetamine-dextroamphetamine (ADDERALL) 30 MG tablet, Take 1 tablet by mouth 2 (two) times daily., Disp: , Rfl:  .  aspirin EC 81 MG tablet, Take 81 mg by mouth daily., Disp: , Rfl:  .  diazepam (VALIUM) 10 MG tablet, Take 1 tablet (10 mg total) by mouth every 8 (eight) hours as needed for anxiety., Disp: 30 tablet, Rfl: 0 .  fluticasone (FLONASE) 50 MCG/ACT nasal spray, Place 2 sprays into both nostrils daily., Disp: 16 g, Rfl: 11 .  metoprolol (LOPRESSOR) 50 MG tablet, Take 1 tablet (50 mg total) by mouth 2 (two) times daily., Disp: 60 tablet, Rfl: 11 .  pantoprazole (PROTONIX) 40 MG tablet, Take 1 tablet (40 mg total) by mouth daily. (Patient taking differently: Take 40 mg by mouth daily as needed. ), Disp: 30 tablet, Rfl: 0 .  PRALUENT 75 MG/ML SOPN, Inject 75 mLs as directed every 14 (fourteen) days., Disp: , Rfl:  .  zolpidem (AMBIEN) 10 MG tablet, Take 10 mg by mouth at bedtime as needed.  , Disp: , Rfl:   No Known Allergies  ROS  Ten systems reviewed and is negative except as mentioned in HPI  Objective  Vitals:   12/22/16 1425  BP: 138/80  Pulse: 79  Resp: 16  Temp: 97.9 F (36.6 C)  TempSrc: Oral  SpO2: 95%  Weight: 205 lb 6.4 oz (93.2 kg)  Height: '5\' 5"'  (1.651 m)   Body mass index is 34.18 kg/m.  Nursing Note and Vital Signs reviewed.  Physical Exam  Constitutional: Patient appears well-developed and well-nourished. Obese No distress.  HEENT: head atraumatic, normocephalic Cardiovascular: Normal rate, regular rhythm, S1/S2 present.  No murmur or rub heard. No BLE  edema. Pulmonary/Chest: Effort normal and breath sounds clear. No respiratory distress or retractions. Abdominal: Soft and non-tender, bowel sounds present x4 quadrants.  No CVA Tenderness. Psychiatric: Patient has a normal mood and affect. behavior is normal. Judgment and thought content normal.  Recent Results (from the past 2160 hour(s))  Basic metabolic panel     Status: Abnormal   Collection Time: 11/04/16  2:07 PM  Result Value Ref Range   Sodium 136 135 - 145 mmol/L   Potassium 3.9 3.5 - 5.1 mmol/L   Chloride 101 101 - 111 mmol/L   CO2 25 22 - 32 mmol/L   Glucose, Bld 107 (H) 65 - 99 mg/dL   BUN 12 6 - 20 mg/dL   Creatinine, Ser 0.75 0.44 - 1.00 mg/dL   Calcium 8.8 (L) 8.9 - 10.3 mg/dL   GFR calc non Af Amer >60 >60 mL/min   GFR calc Af Amer >60 >60 mL/min    Comment: (NOTE) The eGFR has been calculated using the CKD EPI equation. This calculation has not been validated in all clinical situations. eGFR's persistently <60 mL/min signify possible Chronic Kidney Disease.    Anion gap 10 5 - 15  CBC     Status: None   Collection Time: 11/04/16  2:07 PM  Result Value Ref Range   WBC 6.2 3.6 - 11.0 K/uL   RBC 4.05 3.80 - 5.20 MIL/uL   Hemoglobin 13.3 12.0 - 16.0 g/dL   HCT 40.0 35.0 - 47.0 %   MCV 98.7 80.0 - 100.0 fL   MCH 32.8 26.0 - 34.0 pg   MCHC 33.3 32.0 - 36.0 g/dL   RDW 14.5 11.5 - 14.5 %   Platelets 377 150 - 440 K/uL  Troponin I     Status: None   Collection Time: 11/04/16  2:07 PM  Result Value Ref Range   Troponin I <0.03 <0.03 ng/mL  Troponin I     Status: None   Collection Time: 11/04/16  5:29 PM  Result Value Ref Range   Troponin I <0.03 <0.03 ng/mL  POCT urinalysis dipstick     Status: Abnormal   Collection Time: 12/22/16  2:28 PM  Result Value Ref Range   Color, UA yellow    Clarity, UA clear    Glucose, UA negative    Bilirubin, UA negative    Ketones, UA negative    Spec Grav, UA <=1.005 (A) 1.010 - 1.025   Blood, UA small    pH, UA 5.0 5.0  - 8.0   Protein, UA trace    Urobilinogen, UA 0.2 0.2 or 1.0 E.U./dL   Nitrite, UA negative    Leukocytes, UA Moderate (2+) (A) Negative     Assessment & Plan  1. Acute cystitis with hematuria - POCT urinalysis dipstick - nitrofurantoin, macrocrystal-monohydrate, (MACROBID) 100 MG capsule; Take 1 capsule (100 mg total) by mouth 2 (two) times daily.  Dispense: 10 capsule; Refill: 0 - Urine Culture  -Red flags and when to present for emergency care or RTC including fever >101.12F, flank pain, back pain, nausea or vomiting, new/worsening/un-resolving symptoms, reviewed with patient at time of visit. Follow up and care instructions discussed and provided in AVS.  I have reviewed this encounter including the documentation in this note and/or discussed this patient with the Johney Maine, FNP, NP-C. I am certifying that I agree with the content of this note as supervising physician.  Steele Sizer, MD [facility] 12/26/2016, 5:19 PM

## 2016-12-22 NOTE — Patient Instructions (Addendum)

## 2016-12-25 LAB — URINE CULTURE
MICRO NUMBER:: 80993692
SPECIMEN QUALITY:: ADEQUATE

## 2017-01-07 ENCOUNTER — Encounter: Payer: Self-pay | Admitting: Family Medicine

## 2017-01-07 ENCOUNTER — Ambulatory Visit (INDEPENDENT_AMBULATORY_CARE_PROVIDER_SITE_OTHER): Payer: Medicare Other | Admitting: Family Medicine

## 2017-01-07 VITALS — BP 128/84 | HR 75 | Temp 97.9°F | Resp 16 | Ht 65.0 in | Wt 203.0 lb

## 2017-01-07 DIAGNOSIS — L298 Other pruritus: Secondary | ICD-10-CM | POA: Diagnosis not present

## 2017-01-07 DIAGNOSIS — B373 Candidiasis of vulva and vagina: Secondary | ICD-10-CM

## 2017-01-07 DIAGNOSIS — R300 Dysuria: Secondary | ICD-10-CM

## 2017-01-07 DIAGNOSIS — B3731 Acute candidiasis of vulva and vagina: Secondary | ICD-10-CM

## 2017-01-07 DIAGNOSIS — Z7251 High risk heterosexual behavior: Secondary | ICD-10-CM

## 2017-01-07 DIAGNOSIS — N898 Other specified noninflammatory disorders of vagina: Secondary | ICD-10-CM

## 2017-01-07 LAB — POCT URINALYSIS DIPSTICK
Bilirubin, UA: NEGATIVE
Glucose, UA: NEGATIVE
Ketones, UA: NEGATIVE
Nitrite, UA: NEGATIVE
Spec Grav, UA: 1.015 (ref 1.010–1.025)
Urobilinogen, UA: 0.2 E.U./dL
pH, UA: 5 (ref 5.0–8.0)

## 2017-01-07 MED ORDER — FLUCONAZOLE 150 MG PO TABS: 150.0000 mg | ORAL_TABLET | Freq: Once | ORAL | 1 refills | Status: AC

## 2017-01-07 NOTE — Patient Instructions (Addendum)
Preventing Sexually Transmitted Infections, Adult Sexually transmitted infections (STIs) are diseases that are passed (transmitted) from person to person through bodily fluids exchanged during sex or sexual contact. Bodily fluids include saliva, semen, blood, vaginal mucus, and urine. You may have an increased risk for developing an STI if you have unprotected oral, vaginal, or anal sex. Some common STIs include:  Herpes.  Hepatitis B.  Chlamydia.  Gonorrhea.  Syphilis.  HPV (human papillomavirus).  HIV (humanimmunodeficiency virus), the virus that can cause AIDS (acquired immunodeficiency virus).  How can I protect myself from sexually transmitted infections? The only way to completely prevent STIs is not to have sex of any kind (practice abstinence). This includes oral, vaginal, or anal sex. If you are sexually active, take these actions to lower your risk of getting an STI:  Have only one sex partner (be monogamous) or limit the number of sexual partners you have.  Stay up-to-date on immunizations. Certain vaccines can lower your risk of getting certain STIs, such as: ? Hepatitis A and B vaccines. You may have been vaccinated as a young child, but likely need a booster shot as a teen or young adult. ? HPV vaccine. This vaccine is recommended if you are a man under age 22 or a woman under age 27.  Use methods that prevent the exchange of body fluids between partners (barrier protection) every time you have sex. Barrier protection can be used during oral, vaginal, or anal sex. Commonly used barrier methods include: ? Female condom. ? Female condom. ? Dental dam.  Get tested regularly for STIs. Have your sexual partner get tested regularly as well.  Avoid mixing alcohol, drugs, and sex. Alcohol and drug use can affect your ability to make good decisions and can lead to risky sexual behaviors.  Ask your health care provider about taking pre-exposure prophylaxis (PrEP) to prevent HIV  infection if you: ? Have a HIV-positive sexual partner. ? Have multiple sexual partners or partners who do not know their HIV status, and do not regularly use a condom during sex. ? Use injection drugs and share needles.  Birth control pills, injections, implants, and intrauterine devices (IUDs) do not protect against STIs. To prevent both STIs and pregnancy, always use a condom with another form of birth control. Some STIs, such as herpes, are spread through skin to skin contact. A condom does not protect you from getting such STIs. If you or your partner have herpes and there is an active flare with open sores, avoid all sexual contact. Why are these changes important? Taking steps to practice safe sex protects you and others. Many STIs can be cured. However, some STIs are not curable and will affect you for the rest of your life. STIs can be passed on to another person even if you do not have symptoms. What can happen if changes are not made? Certain STIs may:  Require you to take medicine for the rest of your life.  Affect your ability to have children (your fertility).  Increase your risk for developing another STI or certain serious health conditions, such as: ? Cervical cancer. ? Head and neck cancer. ? Pelvic inflammatory disease (PID) in women. ? Organ damage or damage to other parts of your body, if the infection spreads.  Be passed to a baby during childbirth.  How are sexually transmitted infections treated? If you or your partner know or think that you may have an STI:  Talk with your healthcare provider about what can be   done to treat it. Some STIs can be treated and cured with medicines.  For curable STIs, you and your partner should avoid sex during treatment and for several days after treatment is complete.  You and your partner should both be treated at the same time, if there is any chance that your partner is infected as well. If you get treatment but your partner  does not, your partner can re-infect you when you resume sexual contact.  Do not have unprotected sex.  Where to find more information: Learn more about sexually transmitted diseases and infections from:  Centers for Disease Control and Prevention: ? More information about specific STIs: AppraiserFraud.fi ? Find places to get sexual health counseling and treatment for free or for a low cost: gettested.StoreMirror.com.cy  [place] Department of Health and Human Services: http://white.info/.html  Summary  The only way to completely prevent STIs is not to have sex (practice abstinence), including oral, vaginal, or anal sex.  STIs can spread through saliva, semen, blood, vaginal mucus, urine, or sexual contact.  If you do have sex, limit your number of sexual partners and use a barrier protection method every time you have sex.  If you develop an STI, get treated right away and ask your partner to be treated as well. Do not resume having sex until both of you have completed treatment for the STI. This information is not intended to replace advice given to you by your health care provider. Make sure you discuss any questions you have with your health care provider. Document Released: 03/27/2016 Document Revised: 03/27/2016 Document Reviewed: 03/27/2016 Elsevier Interactive Patient Education  2018 [employer].  Vaginal Yeast infection, Adult Vaginal yeast infection is a condition that causes soreness, swelling, and redness (inflammation) of the vagina. It also causes vaginal discharge. This is a common condition. Some women get this infection frequently. What are the causes? This condition is caused by a change in the normal balance of the yeast (candida) and bacteria that live in the vagina. This change causes an overgrowth of yeast, which causes the inflammation. What increases the risk? This condition is more likely to develop  in:  Women who take antibiotic medicines.  Women who have diabetes.  Women who take birth control pills.  Women who are pregnant.  Women who douche often.  Women who have a weak defense (immune) system.  Women who have been taking steroid medicines for a long time.  Women who frequently wear tight clothing.  What are the signs or symptoms? Symptoms of this condition include:  White, thick vaginal discharge.  Swelling, itching, redness, and irritation of the vagina. The lips of the vagina (vulva) may be affected as well.  Pain or a burning feeling while urinating.  Pain during sex.  How is this diagnosed? This condition is diagnosed with a medical history and physical exam. This will include a pelvic exam. Your health care provider will examine a sample of your vaginal discharge under a microscope. Your health care provider may send this sample for testing to confirm the diagnosis. How is this treated? This condition is treated with medicine. Medicines may be over-the-counter or prescription. You may be told to use one or more of the following:  Medicine that is taken orally.  Medicine that is applied as a cream.  Medicine that is inserted directly into the vagina (suppository).  Follow these instructions at home:  Take or apply over-the-counter and prescription medicines only as told by your health care provider.  Do  not have sex until your health care provider has approved. Tell your sex partner that you have a yeast infection. That person should go to his or her health care provider if he or she develops symptoms.  Do not wear tight clothes, such as pantyhose or tight pants.  Avoid using tampons until your health care provider approves.  Eat more yogurt. This may help to keep your yeast infection from returning.  Try taking a sitz bath to help with discomfort. This is a warm water bath that is taken while you are sitting down. The water should only come up to  your hips and should cover your buttocks. Do this 3-4 times per day or as told by your health care provider.  Do not douche.  Wear breathable, cotton underwear.  If you have diabetes, keep your blood sugar levels under control. Contact a health care provider if:  You have a fever.  Your symptoms go away and then return.  Your symptoms do not get better with treatment.  Your symptoms get worse.  You have new symptoms.  You develop blisters in or around your vagina.  You have blood coming from your vagina and it is not your menstrual period.  You develop pain in your abdomen. This information is not intended to replace advice given to you by your health care provider. Make sure you discuss any questions you have with your health care provider. Document Released: 01/08/2005 Document Revised: 09/12/2015 Document Reviewed: 10/02/2014 Elsevier Interactive Patient Education  2018 [employer].

## 2017-01-07 NOTE — Progress Notes (Addendum)
Name: Linda Mejia   MRN: [id number]    DOB: 1955/04/17   Date:01/07/2017       Progress Note  Subjective  Chief Complaint  Chief Complaint  Patient presents with  . Urinary Tract Infection    HPI  Patient presents with concern for vaginal irritation and dysuria. She notes vaginal itching, some fatigue, urinary frequency and urgency, and mild left lower back pain.  Denies vaginal discharge or bleeding, abdominal pain, nausea or vomiting.  She does note that she has had multiple new sexual partners in the last year, and is agreeable to STI testing today.  She [facility]ich[facility] within the last two months.  She does have a history of genital herpes and takes acyclovir daily.    Patient Active Problem List   Diagnosis Date Noted  . Coronary artery calcification seen on CT scan 12/02/2016  . Aortic atherosclerosis ([place]) 12/02/2016  . Abnormal ankle brachial index (ABI) 12/02/2016  . Prediabetes 12/02/2016  . Fracture of rib 08/28/2016  . Low back strain 08/28/2016  . Chest pain 08/11/2016  . Dizziness 08/11/2016  . Nausea & vomiting 07/29/2016  . Hx of tobacco use, presenting hazards to health 06/09/2016  . Encounter for HIV (human immunodeficiency virus) test 06/09/2016  . Degenerative arthritis of knee, bilateral 12/27/2015  . Obesity 12/27/2015  . GI bleed 09/21/2015  . Reflux esophagitis   . Other diseases of stomach and duodenum   . Duodenitis   . Subclinical hypothyroidism 06/06/2015  . Multiple joint pain 06/05/2015  . Night sweats 06/05/2015  . Headache 04/12/2015  . Subclinical hypothyroidism 03/26/2015  . Herpes simplex type 2 infection 03/21/2015  . Rhinitis, allergic 03/21/2015  . Cervical pain 09/21/2014  . ADD (attention deficit hyperactivity disorder, inattentive type) 09/20/2014  . Episodic paroxysmal anxiety disorder 09/20/2014  . History of gastric ulcer 09/20/2014  . Abuse, drug or alcohol 09/20/2014  . HLD  (hyperlipidemia) 10/03/2009  . Coronary artery disease involving native coronary artery of native heart with angina pectoris ([place]) 10/02/2009  . Hypertension goal BP (blood pressure) < 140/90 10/02/2009    Social History  Substance Use Topics  . Smoking status: Former Smoker    Packs/day: 1.00    Years: 30.00  . Smokeless tobacco: Never Used  . Alcohol use Yes     Comment: occasionally     Current Outpatient Prescriptions:  .  acyclovir (ZOVIRAX) 400 MG tablet, take 1 tablet by mouth twice a day, Disp: 60 tablet, Rfl: 5 .  amphetamine-dextroamphetamine (ADDERALL) 30 MG tablet, Take 1 tablet by mouth 2 (two) times daily., Disp: , Rfl:  .  aspirin EC 81 MG tablet, Take 81 mg by mouth daily., Disp: , Rfl:  .  diazepam (VALIUM) 10 MG tablet, Take 1 tablet (10 mg total) by mouth every 8 (eight) hours as needed for anxiety., Disp: 30 tablet, Rfl: 0 .  fluticasone (FLONASE) 50 MCG/ACT nasal spray, Place 2 sprays into both nostrils daily., Disp: 16 g, Rfl: 11 .  metoprolol (LOPRESSOR) 50 MG tablet, Take 1 tablet (50 mg total) by mouth 2 (two) times daily., Disp: 60 tablet, Rfl: 11 .  pantoprazole (PROTONIX) 40 MG tablet, Take 1 tablet (40 mg total) by mouth daily. (Patient taking differently: Take 40 mg by mouth daily as needed. ), Disp: 30 tablet, Rfl: 0 .  PRALUENT 75 MG/ML SOPN, Inject 75 mLs as directed every 14 (fourteen) days., Disp: , Rfl:  .  zolpidem (AMBIEN) 10  MG tablet, Take 10 mg by mouth at bedtime as needed.  , Disp: , Rfl:  .  fluconazole (DIFLUCAN) 150 MG tablet, Take 1 tablet (150 mg total) by mouth once., Disp: 1 tablet, Rfl: 1  No Known Allergies  ROS  Ten systems reviewed and is negative except as mentioned in HPI  Objective  Vitals:   01/07/17 1316  BP: 128/84  Pulse: 75  Resp: 16  Temp: 97.9 F (36.6 C)  TempSrc: Oral  SpO2: 94%  Weight: 203 lb (92.1 kg)  Height: _0  (1.651 m)   Body mass index is 33.78 kg/m.  Nursing Note and Vital Signs  reviewed.  Physical Exam  Constitutional: Patient appears well-developed and well-nourished. Obese. No distress.  HEENT: head atraumatic, normocephalic Cardiovascular: Normal rate, regular rhythm, S1/S2 present.  No murmur or rub heard. No BLE edema. Pulmonary/Chest: Effort normal and breath sounds clear. No respiratory distress or retractions. Abdominal: Soft and non-tender, bowel sounds present x4 quadrants.  No CVA Tenderness Psychiatric: Patient has a normal mood and affect. behavior is normal. Judgment and thought content normal. FEMALE GENITALIA:  External genitalia moderately erythematous without any lesions - area is non-tender to palpation, no excoriation noted. External urethra normal Vaginal vault normal without lesions; small amount of thin white discharge present Cervix normal without discharge or lesions Bimanual exam normal without masses Musculoskeletal: Normal range of motion, no joint effusions. No gross deformities. Mild tenderness to LEFT low back musculature. Neurological: he is alert and oriented to person, place, and time. No cranial nerve deficit. Coordination, balance, strength, speech and gait are normal.    Recent Results (from the past 2160 hour(s))  Basic metabolic panel     Status: Abnormal   Collection Time: 11/04/16  2:07 PM  Result Value Ref Range   Sodium 136 135 - 145 mmol/L   Potassium 3.9 3.5 - 5.1 mmol/L   Chloride 101 101 - 111 mmol/L   CO2 25 22 - 32 mmol/L   Glucose, Bld 107 (H) 65 - 99 mg/dL   BUN 12 6 - 20 mg/dL   Creatinine, Ser 0.75 0.44 - 1.00 mg/dL   Calcium 8.8 (L) 8.9 - 10.3 mg/dL   GFR calc non Af Amer >60 >60 mL/min   GFR calc Af Amer >60 >60 mL/min    Comment: (NOTE) The eGFR has been calculated using the CKD EPI equation. This calculation has not been validated in all clinical situations. eGFR's persistently <60 mL/min signify possible Chronic Kidney Disease.    Anion gap 10 5 - 15  CBC     Status: None   Collection Time:  11/04/16  2:07 PM  Result Value Ref Range   WBC 6.2 3.6 - 11.0 K/uL   RBC 4.05 3.80 - 5.20 MIL/uL   Hemoglobin 13.3 12.0 - 16.0 g/dL   HCT 40.0 35.0 - 47.0 %   MCV 98.7 80.0 - 100.0 fL   MCH 32.8 26.0 - 34.0 pg   MCHC 33.3 32.0 - 36.0 g/dL   RDW 14.5 11.5 - 14.5 %   Platelets 377 150 - 440 K/uL  Troponin I     Status: None   Collection Time: 11/04/16  2:07 PM  Result Value Ref Range   Troponin I <0.03 <0.03 ng/mL  Troponin I     Status: None   Collection Time: 11/04/16  5:29 PM  Result Value Ref Range   Troponin I <0.03 <0.03 ng/mL  POCT urinalysis dipstick     Status: Abnormal  Collection Time: 12/22/16  2:28 PM  Result Value Ref Range   Color, UA yellow    Clarity, UA clear    Glucose, UA negative    Bilirubin, UA negative    Ketones, UA negative    Spec Grav, UA <=1.005 (A) 1.010 - 1.025   Blood, UA small    pH, UA 5.0 5.0 - 8.0   Protein, UA trace    Urobilinogen, UA 0.2 0.2 or 1.0 E.U./dL   Nitrite, UA negative    Leukocytes, UA Moderate (2+) (A) Negative  Urine Culture     Status: Abnormal   Collection Time: 12/22/16  3:10 PM  Result Value Ref Range   MICRO NUMBER: [id number]    SPECIMEN QUALITY: ADEQUATE    Sample Source URINE, CLEAN CATCH    STATUS: FINAL    ISOLATE 1: Escherichia coli (A)       Susceptibility   Escherichia coli - URINE CULTURE, REFLEX    AMOX/CLAVULANIC 8 Sensitive     AMPICILLIN >=32 Resistant     AMPICILLIN/SULBACTAM 16 Intermediate     CEFAZOLIN* <=4 Not Reportable      * For infections other than uncomplicated UTIcaused by E. coli, K. pneumoniae or P. mirabilis:Cefazolin is resistant if MIC > or = 8 mcg/mL.(Distinguishing susceptible versus intermediatefor isolates with MIC < or = 4 mcg/mL requiresadditional testing.)For uncomplicated UTI caused by E. coli,K. pneumoniae or P. mirabilis: Cefazolin issusceptible if MIC <32 mcg/mL and predictssusceptible to the oral agents cefaclor, cefdinir,cefpodoxime, cefprozil, cefuroxime, cephalexinand  loracarbef.    CEFEPIME <=1 Sensitive     CEFTRIAXONE <=1 Sensitive     CIPROFLOXACIN 1 Sensitive     LEVOFLOXACIN 1 Sensitive     ERTAPENEM <=0.5 Sensitive     GENTAMICIN <=1 Sensitive     IMIPENEM <=0.25 Sensitive     NITROFURANTOIN <=16 Sensitive     PIP/TAZO <=4 Sensitive     TOBRAMYCIN <=1 Sensitive     TRIMETH/SULFA* >=320 Resistant      * For infections other than uncomplicated UTIcaused by E. coli, K. pneumoniae or P. mirabilis:Cefazolin is resistant if MIC > or = 8 mcg/mL.(Distinguishing susceptible versus intermediatefor isolates with MIC < or = 4 mcg/mL requiresadditional testing.)For uncomplicated UTI caused by E. coli,K. pneumoniae or P. mirabilis: Cefazolin issusceptible if MIC <32 mcg/mL and predictssusceptible to the oral agents cefaclor, cefdinir,cefpodoxime, cefprozil, cefuroxime, cephalexinand loracarbef.Legend:S = Susceptible  I = IntermediateR = Resistant  NS = Not susceptible* = Not tested  NR = Not reported**NN = See antimicrobic comments  POCT urinalysis dipstick     Status: Abnormal   Collection Time: 01/07/17  1:58 PM  Result Value Ref Range   Color, UA yellow    Clarity, UA clear    Glucose, UA negative    Bilirubin, UA negative    Ketones, UA negative    Spec Grav, UA 1.015 1.010 - 1.025   Blood, UA moderate    pH, UA 5.0 5.0 - 8.0   Protein, UA trace    Urobilinogen, UA 0.2 0.2 or 1.0 E.U./dL   Nitrite, UA negative    Leukocytes, UA Small (1+) (A) Negative   Assessment & Plan  1. Vaginal candidiasis - fluconazole (DIFLUCAN) 150 MG tablet; Take 1 tablet (150 mg total) by mouth once.  Dispense: 1 tablet; Refill: 1  2. Dysuria - POCT urinalysis dipstick  - WET PREP BY MOLECULAR PROBE - C. trachomatis/N. gonorrhoeae RNA - Urine Culture - Advised that due to recent use of antibiotics to  treat E. Coli cystitis, and equivocal urine dip, we will await culture and other testing prior to treating.  We will re-test urine at her upcoming appointment on  02/02/2017 to determine if ongoing hematuria is present.  3. Vaginal itching - POCT urinalysis dipstick - WET PREP BY MOLECULAR PROBE - C. trachomatis/N. gonorrhoeae RNA  4. High risk sexual behavior - WET PREP BY MOLECULAR PROBE - C. trachomatis/N. gonorrhoeae RNA - Discussed safe sexual practices including condom use for STI prevention in great detail, pt verbalizes understanding.   -Red flags and when to present for emergency care or RTC including fever >101.30F, severe abdominal or back pain, vomiting, new/worsening/un-resolving symptoms, reviewed with patient at time of visit. Follow up and care instructions discussed and provided in AVS.  I have reviewed this encounter including the documentation in this note and/or discussed this patient with the Johney Maine, FNP, NP-C. I am certifying that I agree with the content of this note as supervising physician.  Steele Sizer, MD [facility] 01/07/2017, 5:22 PM

## 2017-01-08 ENCOUNTER — Other Ambulatory Visit: Payer: Self-pay | Admitting: Family Medicine

## 2017-01-08 DIAGNOSIS — B9689 Other specified bacterial agents as the cause of diseases classified elsewhere: Secondary | ICD-10-CM

## 2017-01-08 DIAGNOSIS — M170 Bilateral primary osteoarthritis of knee: Secondary | ICD-10-CM | POA: Diagnosis not present

## 2017-01-08 DIAGNOSIS — N760 Acute vaginitis: Principal | ICD-10-CM

## 2017-01-08 LAB — WET PREP BY MOLECULAR PROBE
Candida species: NOT DETECTED
MICRO NUMBER:: 81067162
SPECIMEN QUALITY:: ADEQUATE
Trichomonas vaginosis: NOT DETECTED

## 2017-01-08 LAB — C. TRACHOMATIS/N. GONORRHOEAE RNA
C. trachomatis RNA, TMA: NOT DETECTED
N. gonorrhoeae RNA, TMA: NOT DETECTED

## 2017-01-08 MED ORDER — METRONIDAZOLE 500 MG PO TABS: 500.0000 mg | ORAL_TABLET | Freq: Two times a day (BID) | ORAL | 0 refills | Status: AC

## 2017-01-09 ENCOUNTER — Other Ambulatory Visit: Payer: Self-pay | Admitting: Family Medicine

## 2017-01-09 DIAGNOSIS — N3001 Acute cystitis with hematuria: Secondary | ICD-10-CM

## 2017-01-09 DIAGNOSIS — R110 Nausea: Secondary | ICD-10-CM

## 2017-01-09 MED ORDER — ONDANSETRON HCL 4 MG PO TABS: 4.0000 mg | ORAL_TABLET | Freq: Three times a day (TID) | ORAL | 0 refills | Status: DC | PRN

## 2017-01-09 MED ORDER — CIPROFLOXACIN HCL 250 MG PO TABS: 250.0000 mg | ORAL_TABLET | Freq: Two times a day (BID) | ORAL | 0 refills | Status: DC

## 2017-01-09 NOTE — Progress Notes (Signed)
Pt called in to report that she had 2-3 drops of blood in her urine today and she is feeling fatigued. She is taking flagyl for BV, and we are awaiting the final urine culture results.  I advised that based on her exam and UA from 01/07/2017 and preliminary results of E. Coli from urine culture, we will begin treatment with 3 days of Ciprofloxacin.  Emergency Precautions reviewed in detail with patient and she verbalizes understanding.

## 2017-01-09 NOTE — Progress Notes (Signed)
Patient initially declined Zofran, but called back this afternoon to request medication while she's taking antibiotics.

## 2017-01-10 LAB — URINE CULTURE
MICRO NUMBER:: 81067167
SPECIMEN QUALITY:: ADEQUATE

## 2017-01-28 ENCOUNTER — Ambulatory Visit (INDEPENDENT_AMBULATORY_CARE_PROVIDER_SITE_OTHER): Payer: Medicare Other | Admitting: Family Medicine

## 2017-01-28 ENCOUNTER — Encounter: Payer: Self-pay | Admitting: Family Medicine

## 2017-01-28 VITALS — BP 118/74 | HR 73 | Temp 97.8°F | Resp 18 | Ht 65.0 in | Wt 201.9 lb

## 2017-01-28 DIAGNOSIS — R8290 Unspecified abnormal findings in urine: Secondary | ICD-10-CM

## 2017-01-28 DIAGNOSIS — Z113 Encounter for screening for infections with a predominantly sexual mode of transmission: Secondary | ICD-10-CM

## 2017-01-28 DIAGNOSIS — R350 Frequency of micturition: Secondary | ICD-10-CM | POA: Diagnosis not present

## 2017-01-28 DIAGNOSIS — Z7251 High risk heterosexual behavior: Secondary | ICD-10-CM

## 2017-01-28 LAB — POCT URINALYSIS DIPSTICK
Bilirubin, UA: NEGATIVE
Blood, UA: NEGATIVE
Glucose, UA: NEGATIVE
Ketones, UA: NEGATIVE
Leukocytes, UA: NEGATIVE
Nitrite, UA: NEGATIVE
Protein, UA: NEGATIVE
Spec Grav, UA: 1.01 (ref 1.010–1.025)
Urobilinogen, UA: NEGATIVE E.U./dL — AB
pH, UA: 5 (ref 5.0–8.0)

## 2017-01-28 NOTE — Patient Instructions (Addendum)
Preventing Sexually Transmitted Infections, Adult Sexually transmitted infections (STIs) are diseases that are passed (transmitted) from person to person through bodily fluids exchanged during sex or sexual contact. Bodily fluids include saliva, semen, blood, vaginal mucus, and urine. You may have an increased risk for developing an STI if you have unprotected oral, vaginal, or anal sex. Some common STIs include:  Herpes.  Hepatitis B.  Chlamydia.  Gonorrhea.  Syphilis.  HPV (human papillomavirus).  HIV (humanimmunodeficiency virus), the virus that can cause AIDS (acquired immunodeficiency virus).  How can I protect myself from sexually transmitted infections? The only way to completely prevent STIs is not to have sex of any kind (practice abstinence). This includes oral, vaginal, or anal sex. If you are sexually active, take these actions to lower your risk of getting an STI:  Have only one sex partner (be monogamous) or limit the number of sexual partners you have.  Stay up-to-date on immunizations. Certain vaccines can lower your risk of getting certain STIs, such as: ? Hepatitis A and B vaccines. You may have been vaccinated as a young child, but likely need a booster shot as a teen or young adult. ? HPV vaccine. This vaccine is recommended if you are a man under age 22 or a woman under age 27.  Use methods that prevent the exchange of body fluids between partners (barrier protection) every time you have sex. Barrier protection can be used during oral, vaginal, or anal sex. Commonly used barrier methods include: ? Female condom. ? Female condom. ? Dental dam.  Get tested regularly for STIs. Have your sexual partner get tested regularly as well.  Avoid mixing alcohol, drugs, and sex. Alcohol and drug use can affect your ability to make good decisions and can lead to risky sexual behaviors.  Ask your health care provider about taking pre-exposure prophylaxis (PrEP) to prevent HIV  infection if you: ? Have a HIV-positive sexual partner. ? Have multiple sexual partners or partners who do not know their HIV status, and do not regularly use a condom during sex. ? Use injection drugs and share needles.  Birth control pills, injections, implants, and intrauterine devices (IUDs) do not protect against STIs. To prevent both STIs and pregnancy, always use a condom with another form of birth control. Some STIs, such as herpes, are spread through skin to skin contact. A condom does not protect you from getting such STIs. If you or your partner have herpes and there is an active flare with open sores, avoid all sexual contact. Why are these changes important? Taking steps to practice safe sex protects you and others. Many STIs can be cured. However, some STIs are not curable and will affect you for the rest of your life. STIs can be passed on to another person even if you do not have symptoms. What can happen if changes are not made? Certain STIs may:  Require you to take medicine for the rest of your life.  Affect your ability to have children (your fertility).  Increase your risk for developing another STI or certain serious health conditions, such as: ? Cervical cancer. ? Head and neck cancer. ? Pelvic inflammatory disease (PID) in women. ? Organ damage or damage to other parts of your body, if the infection spreads.  Be passed to a baby during childbirth.  How are sexually transmitted infections treated? If you or your partner know or think that you may have an STI:  Talk with your healthcare provider about what can be   done to treat it. Some STIs can be treated and cured with medicines.  For curable STIs, you and your partner should avoid sex during treatment and for several days after treatment is complete.  You and your partner should both be treated at the same time, if there is any chance that your partner is infected as well. If you get treatment but your partner  does not, your partner can re-infect you when you resume sexual contact.  Do not have unprotected sex.  Where to find more information: Learn more about sexually transmitted diseases and infections from:  Centers for Disease Control and Prevention: ? More information about specific STIs: www.cdc.gov/std ? Find places to get sexual health counseling and treatment for free or for a low cost: gettested.cdc.gov  [place].S[place] Department of Health and Human Services: www.womenshealth.gov/publications/our-publications/fact-sheet/sexually-transmitted-infections.html  Summary  The only way to completely prevent STIs is not to have sex (practice abstinence), including oral, vaginal, or anal sex.  STIs can spread through saliva, semen, blood, vaginal mucus, urine, or sexual contact.  If you do have sex, limit your number of sexual partners and use a barrier protection method every time you have sex.  If you develop an STI, get treated right away and ask your partner to be treated as well. Do not resume having sex until both of you have completed treatment for the STI. This information is not intended to replace advice given to you by your health care provider. Make sure you discuss any questions you have with your health care provider. Document Released: 03/27/2016 Document Revised: 03/27/2016 Document Reviewed: 03/27/2016 Elsevier Interactive Patient Education  2018 [employer].  

## 2017-01-28 NOTE — Progress Notes (Signed)
Name: Linda Mejia   MRN: [id number]    DOB: 1955/10/19   Date:01/28/2017       Progress Note  Subjective  Chief Complaint  Chief Complaint  Patient presents with  . Follow-up    HPI  Pt presents for concern for STI's - she was positive for BV and cystitis on 01/07/2017.  She [facility] also [facility] (had subsequent negative testing on 01/07/2017.  She has had intercourse since then (same partner) and used a condom this time - she is planning to continue condom use.  She notes that since she completed antibiotic treatments, she is having additional vaginal "moisture" but denies actual discharge.  Denies vaginal itching, pain with intercourse, vaginal bleeding, no vaginal lesions that she has noticed - she has not had any genital herpes outbreaks in over 2 years.  No abdominal/flank/back pain, no urinary symptoms - no dysuria, hematuria. Endorses urinary frequency and foul-smelling urine.  She is very concerned for recurrent STI's and would like to be tested again today.  Declines HIV or RPR testing - no rashes, no sores that she has noted. Has had negative of both in the past.  Patient Active Problem List   Diagnosis Date Noted  . Coronary artery calcification seen on CT scan 12/02/2016  . Aortic atherosclerosis ([place]) 12/02/2016  . Abnormal ankle brachial index (ABI) 12/02/2016  . Prediabetes 12/02/2016  . Fracture of rib 08/28/2016  . Low back strain 08/28/2016  . Chest pain 08/11/2016  . Dizziness 08/11/2016  . Nausea & vomiting 07/29/2016  . Hx of tobacco use, presenting hazards to health 06/09/2016  . Encounter for HIV (human immunodeficiency virus) test 06/09/2016  . Degenerative arthritis of knee, bilateral 12/27/2015  . Obesity 12/27/2015  . GI bleed 09/21/2015  . Reflux esophagitis   . Other diseases of stomach and duodenum   . Duodenitis   . Subclinical hypothyroidism 06/06/2015  . Multiple joint pain 06/05/2015  . Night sweats 06/05/2015   . Headache 04/12/2015  . Subclinical hypothyroidism 03/26/2015  . Herpes simplex type 2 infection 03/21/2015  . Rhinitis, allergic 03/21/2015  . Cervical pain 09/21/2014  . ADD (attention deficit hyperactivity disorder, inattentive type) 09/20/2014  . Episodic paroxysmal anxiety disorder 09/20/2014  . History of gastric ulcer 09/20/2014  . Abuse, drug or alcohol ([place]) 09/20/2014  . HLD (hyperlipidemia) 10/03/2009  . Coronary artery disease involving native coronary artery of native heart with angina pectoris ([place]) 10/02/2009  . Hypertension goal BP (blood pressure) < 140/90 10/02/2009    Social History  Substance Use Topics  . Smoking status: Former Smoker    Packs/day: 1.00    Years: 30.00  . Smokeless tobacco: Never Used  . Alcohol use Yes     Comment: occasionally     Current Outpatient Prescriptions:  .  acyclovir (ZOVIRAX) 400 MG tablet, take 1 tablet by mouth twice a day, Disp: 60 tablet, Rfl: 5 .  amphetamine-dextroamphetamine (ADDERALL) 30 MG tablet, Take 1 tablet by mouth 2 (two) times daily., Disp: , Rfl:  .  aspirin EC 81 MG tablet, Take 81 mg by mouth daily., Disp: , Rfl:  .  diazepam (VALIUM) 10 MG tablet, Take 1 tablet (10 mg total) by mouth every 8 (eight) hours as needed for anxiety., Disp: 30 tablet, Rfl: 0 .  fluticasone (FLONASE) 50 MCG/ACT nasal spray, Place 2 sprays into both nostrils daily., Disp: 16 g, Rfl: 11 .  metoprolol (LOPRESSOR) 50 MG tablet, Take 1 tablet (50  mg total) by mouth 2 (two) times daily., Disp: 60 tablet, Rfl: 11 .  ondansetron (ZOFRAN) 4 MG tablet, Take 1 tablet (4 mg total) by mouth every 8 (eight) hours as needed for nausea or vomiting., Disp: 20 tablet, Rfl: 0 .  pantoprazole (PROTONIX) 40 MG tablet, Take 1 tablet (40 mg total) by mouth daily. (Patient taking differently: Take 40 mg by mouth daily as needed. ), Disp: 30 tablet, Rfl: 0 .  PRALUENT 75 MG/ML SOPN, Inject 75 mLs as directed every 14 (fourteen) days., Disp: , Rfl:  .   zolpidem (AMBIEN) 10 MG tablet, Take 10 mg by mouth at bedtime as needed.  , Disp: , Rfl:  .  ciprofloxacin (CIPRO) 250 MG tablet, Take 1 tablet (250 mg total) by mouth 2 (two) times daily. (Patient not taking: Reported on 01/28/2017), Disp: 6 tablet, Rfl: 0  No Known Allergies  ROS Constitutional: Negative for fever or weight change.  Respiratory: Negative for cough and shortness of breath.   Cardiovascular: Negative for chest pain or palpitations.  Gastrointestinal: Negative for abdominal pain, no bowel changes.  Musculoskeletal: Negative for gait problem or joint swelling.  Skin: Negative for rash.  Neurological: Negative for dizziness or headache.  No other specific complaints in a complete review of systems (except as listed in HPI above).  Objective  Vitals:   01/28/17 1038  BP: 118/74  Pulse: 73  Resp: 18  Temp: 97.8 F (36.6 C)  TempSrc: Oral  SpO2: 97%  Weight: 201 lb 14.4 oz (91.6 kg)  Height: _0  (1.651 m)   Body mass index is 33.6 kg/m.  Nursing Note and Vital Signs reviewed.  Physical Exam  Constitutional: Patient appears well-developed and well-nourished. Obese No distress.  HEENT: head atraumatic, normocephalic Cardiovascular: Normal rate, regular rhythm, S1/S2 present.  No murmur or rub heard. No BLE edema. Pulmonary/Chest: Effort normal and breath sounds clear. No respiratory distress or retractions. Abdominal: Soft and non-tender, bowel sounds present x4 quadrants.  No CVA Tenderness Psychiatric: Patient has a normal mood and affect. behavior is normal. Judgment and thought content normal. Female Genitalia: Deferred.  Recent Results (from the past 2160 hour(s))  Basic metabolic panel     Status: Abnormal   Collection Time: 11/04/16  2:07 PM  Result Value Ref Range   Sodium 136 135 - 145 mmol/L   Potassium 3.9 3.5 - 5.1 mmol/L   Chloride 101 101 - 111 mmol/L   CO2 25 22 - 32 mmol/L   Glucose, Bld 107 (H) 65 - 99 mg/dL   BUN 12 6 - 20 mg/dL    Creatinine, Ser 0.75 0.44 - 1.00 mg/dL   Calcium 8.8 (L) 8.9 - 10.3 mg/dL   GFR calc non Af Amer >60 >60 mL/min   GFR calc Af Amer >60 >60 mL/min    Comment: (NOTE) The eGFR has been calculated using the CKD EPI equation. This calculation has not been validated in all clinical situations. eGFR's persistently <60 mL/min signify possible Chronic Kidney Disease.    Anion gap 10 5 - 15  CBC     Status: None   Collection Time: 11/04/16  2:07 PM  Result Value Ref Range   WBC 6.2 3.6 - 11.0 K/uL   RBC 4.05 3.80 - 5.20 MIL/uL   Hemoglobin 13.3 12.0 - 16.0 g/dL   HCT 40.0 35.0 - 47.0 %   MCV 98.7 80.0 - 100.0 fL   MCH 32.8 26.0 - 34.0 pg   MCHC 33.3 32.0 - 36.0  g/dL   RDW 14.5 11.5 - 14.5 %   Platelets 377 150 - 440 K/uL  Troponin I     Status: None   Collection Time: 11/04/16  2:07 PM  Result Value Ref Range   Troponin I <0.03 <0.03 ng/mL  Troponin I     Status: None   Collection Time: 11/04/16  5:29 PM  Result Value Ref Range   Troponin I <0.03 <0.03 ng/mL  POCT urinalysis dipstick     Status: Abnormal   Collection Time: 12/22/16  2:28 PM  Result Value Ref Range   Color, UA yellow    Clarity, UA clear    Glucose, UA negative    Bilirubin, UA negative    Ketones, UA negative    Spec Grav, UA <=1.005 (A) 1.010 - 1.025   Blood, UA small    pH, UA 5.0 5.0 - 8.0   Protein, UA trace    Urobilinogen, UA 0.2 0.2 or 1.0 E.U./dL   Nitrite, UA negative    Leukocytes, UA Moderate (2+) (A) Negative  Urine Culture     Status: Abnormal   Collection Time: 12/22/16  3:10 PM  Result Value Ref Range   MICRO NUMBER: [id number]    SPECIMEN QUALITY: ADEQUATE    Sample Source URINE, CLEAN CATCH    STATUS: FINAL    ISOLATE 1: Escherichia coli (A)       Susceptibility   Escherichia coli - URINE CULTURE, REFLEX    AMOX/CLAVULANIC 8 Sensitive     AMPICILLIN >=32 Resistant     AMPICILLIN/SULBACTAM 16 Intermediate     CEFAZOLIN* <=4 Not Reportable      * For infections other than uncomplicated  UTIcaused by E. coli, K. pneumoniae or P. mirabilis:Cefazolin is resistant if MIC > or = 8 mcg/mL.(Distinguishing susceptible versus intermediatefor isolates with MIC < or = 4 mcg/mL requiresadditional testing.)For uncomplicated UTI caused by E. coli,K. pneumoniae or P. mirabilis: Cefazolin issusceptible if MIC <32 mcg/mL and predictssusceptible to the oral agents cefaclor, cefdinir,cefpodoxime, cefprozil, cefuroxime, cephalexinand loracarbef.    CEFEPIME <=1 Sensitive     CEFTRIAXONE <=1 Sensitive     CIPROFLOXACIN 1 Sensitive     LEVOFLOXACIN 1 Sensitive     ERTAPENEM <=0.5 Sensitive     GENTAMICIN <=1 Sensitive     IMIPENEM <=0.25 Sensitive     NITROFURANTOIN <=16 Sensitive     PIP/TAZO <=4 Sensitive     TOBRAMYCIN <=1 Sensitive     TRIMETH/SULFA* >=320 Resistant      * For infections other than uncomplicated UTIcaused by E. coli, K. pneumoniae or P. mirabilis:Cefazolin is resistant if MIC > or = 8 mcg/mL.(Distinguishing susceptible versus intermediatefor isolates with MIC < or = 4 mcg/mL requiresadditional testing.)For uncomplicated UTI caused by E. coli,K. pneumoniae or P. mirabilis: Cefazolin issusceptible if MIC <32 mcg/mL and predictssusceptible to the oral agents cefaclor, cefdinir,cefpodoxime, cefprozil, cefuroxime, cephalexinand loracarbef.Legend:S = Susceptible  I = IntermediateR = Resistant  NS = Not susceptible* = Not tested  NR = Not reported**NN = See antimicrobic comments  POCT urinalysis dipstick     Status: Abnormal   Collection Time: 01/07/17  1:58 PM  Result Value Ref Range   Color, UA yellow    Clarity, UA clear    Glucose, UA negative    Bilirubin, UA negative    Ketones, UA negative    Spec Grav, UA 1.015 1.010 - 1.025   Blood, UA moderate    pH, UA 5.0 5.0 - 8.0   Protein, UA  trace    Urobilinogen, UA 0.2 0.2 or 1.0 E.U./dL   Nitrite, UA negative    Leukocytes, UA Small (1+) (A) Negative  C. trachomatis/N. gonorrhoeae RNA     Status: None   Collection Time:  01/07/17  2:27 PM  Result Value Ref Range   C. trachomatis RNA, TMA NOT DETECTED NOT DETECT   N. gonorrhoeae RNA, TMA NOT DETECTED NOT DETECT    Comment: This test was performed using the [place] ([place] . The analytical performance characteristics of this  assay, when used to test [facility] specimens have been determined by [employer]. .   Urine Culture     Status: Abnormal   Collection Time: 01/07/17  2:28 PM  Result Value Ref Range   MICRO NUMBER: [id number]    SPECIMEN QUALITY: ADEQUATE    Sample Source URINE    STATUS: FINAL    ISOLATE 1: Escherichia coli (A)       Susceptibility   Escherichia coli - URINE CULTURE, REFLEX    AMOX/CLAVULANIC 4 Sensitive     AMPICILLIN >=32 Resistant     AMPICILLIN/SULBACTAM 16 Intermediate     CEFAZOLIN* <=4 Not Reportable      * For infections other than uncomplicated UTIcaused by E. coli, K. pneumoniae or P. mirabilis:Cefazolin is resistant if MIC > or = 8 mcg/mL.(Distinguishing susceptible versus intermediatefor isolates with MIC < or = 4 mcg/mL requiresadditional testing.)For uncomplicated UTI caused by E. coli,K. pneumoniae or P. mirabilis: Cefazolin issusceptible if MIC <32 mcg/mL and predictssusceptible to the oral agents cefaclor, cefdinir,cefpodoxime, cefprozil, cefuroxime, cephalexinand loracarbef.    CEFEPIME <=1 Sensitive     CEFTRIAXONE <=1 Sensitive     CIPROFLOXACIN <=0.25 Sensitive     LEVOFLOXACIN 1 Sensitive     ERTAPENEM <=0.5 Sensitive     GENTAMICIN <=1 Sensitive     IMIPENEM <=0.25 Sensitive     NITROFURANTOIN <=16 Sensitive     PIP/TAZO <=4 Sensitive     TOBRAMYCIN <=1 Sensitive     TRIMETH/SULFA* >=320 Resistant      * For infections other than uncomplicated UTIcaused by E. coli, K. pneumoniae or P. mirabilis:Cefazolin is resistant if MIC > or = 8 mcg/mL.(Distinguishing susceptible versus intermediatefor isolates with MIC < or = 4 mcg/mL requiresadditional testing.)For uncomplicated UTI  caused by E. coli,K. pneumoniae or P. mirabilis: Cefazolin issusceptible if MIC <32 mcg/mL and predictssusceptible to the oral agents cefaclor, cefdinir,cefpodoxime, cefprozil, cefuroxime, cephalexinand loracarbef.Legend:S = Susceptible  I = IntermediateR = Resistant  NS = Not susceptible* = Not tested  NR = Not reported**NN = See antimicrobic comments  WET PREP BY MOLECULAR PROBE     Status: Abnormal   Collection Time: 01/07/17  2:30 PM  Result Value Ref Range   MICRO NUMBER: [id number]    SPECIMEN QUALITY: ADEQUATE    SOURCE: NOT GIVEN    STATUS: FINAL    Trichomonas vaginosis Not Detected    Gardnerella vaginalis (A)     Detected. Increased levels of G. vaginalis may not be significant in the absence of signs and symptoms of bacterial vaginosis.   Candida species Not Detected   POCT urinalysis dipstick     Status: Abnormal   Collection Time: 01/28/17 10:54 AM  Result Value Ref Range   Color, UA yellow    Clarity, UA clear    Glucose, UA negative    Bilirubin, UA negative    Ketones, UA negative    Spec Grav, UA 1.010 1.010 - 1.025   Blood,  UA negative    pH, UA 5.0 5.0 - 8.0   Protein, UA negative    Urobilinogen, UA negative (A) 0.2 or 1.0 E.U./dL   Nitrite, UA negative    Leukocytes, UA Negative Negative     Assessment & Plan  1. Foul smelling urine - POCT urinalysis dipstick - Negative - Urine Culture  2. Urinary frequency - POCT urinalysis dipstick - Negative - Urine Culture  3. Screen for STD (sexually transmitted disease) - WET PREP BY MOLECULAR PROBE - C. trachomatis/N. gonorrhoeae RNA  - Reassurance provided, discussed safe sexual practices at length with patient including abstinence and condom use.  She is very agreeable. We will not treat today due to minimal symptoms - we will await lab testing. -Red flags and when to present for emergency care or RTC including fever >101.35F, abdominal pain, vaginal discharge or bleeding, back pain, dysuria, hematuria,  new/worsening/un-resolving symptoms, reviewed with patient at time of visit. Follow up and care instructions discussed and provided in AVS.

## 2017-01-29 LAB — WET PREP BY MOLECULAR PROBE
Candida species: NOT DETECTED
Gardnerella vaginalis: NOT DETECTED
MICRO NUMBER:: 81160923
SPECIMEN QUALITY:: ADEQUATE
Trichomonas vaginosis: NOT DETECTED

## 2017-01-29 LAB — C. TRACHOMATIS/N. GONORRHOEAE RNA
C. trachomatis RNA, TMA: NOT DETECTED
N. gonorrhoeae RNA, TMA: NOT DETECTED

## 2017-02-02 ENCOUNTER — Encounter: Payer: Self-pay | Admitting: *Deleted

## 2017-02-02 ENCOUNTER — Ambulatory Visit: Payer: Medicare Other

## 2017-02-02 ENCOUNTER — Ambulatory Visit: Payer: Medicare Other | Admitting: Family Medicine

## 2017-02-02 ENCOUNTER — Telehealth: Payer: Self-pay | Admitting: Family Medicine

## 2017-02-02 NOTE — Telephone Encounter (Signed)
This encounter was created in error - please disregard.

## 2017-02-02 NOTE — Telephone Encounter (Signed)
Please call lab and ask about urine culture - it looks like they didn't run it. Thanks!

## 2017-02-03 ENCOUNTER — Ambulatory Visit: Payer: Medicare Other

## 2017-02-03 ENCOUNTER — Ambulatory Visit: Payer: Medicare Other | Admitting: Family Medicine

## 2017-02-03 NOTE — Telephone Encounter (Signed)
Please call patient and ask how she is feeling. If she is still having foul-smelling urine, she needs to come in to provide new urine sample for culture.  If symptoms have resolved, she does not need to come in. Thanks!

## 2017-02-03 NOTE — Telephone Encounter (Signed)
Spoke to [place] at [facility]. The Urine culture was never released, so the lab did not see to run culture.

## 2017-02-03 NOTE — Telephone Encounter (Signed)
Spoke to [employer] and she will send a copy of results

## 2017-02-04 NOTE — Telephone Encounter (Signed)
Patient stated she is feeling better, no odor at this time, will call if any issues come up

## 2017-02-04 NOTE — Telephone Encounter (Signed)
Documentation reviewed 

## 2017-02-10 ENCOUNTER — Ambulatory Visit: Payer: Medicare Other

## 2017-02-10 ENCOUNTER — Telehealth: Payer: Self-pay | Admitting: Family Medicine

## 2017-02-10 ENCOUNTER — Ambulatory Visit: Payer: Medicare Other | Admitting: Family Medicine

## 2017-02-10 NOTE — Telephone Encounter (Signed)
This encounter was created in error - please disregard.

## 2017-02-10 NOTE — Telephone Encounter (Signed)
Per triage note: Pt bought detox from [facility] and is asking if their is any interactions with her meds.

## 2017-03-02 ENCOUNTER — Ambulatory Visit: Payer: Medicare Other | Admitting: Family Medicine

## 2017-03-17 ENCOUNTER — Other Ambulatory Visit: Payer: Self-pay | Admitting: Family Medicine

## 2017-03-17 DIAGNOSIS — B373 Candidiasis of vulva and vagina: Secondary | ICD-10-CM

## 2017-03-17 DIAGNOSIS — B3731 Acute candidiasis of vulva and vagina: Secondary | ICD-10-CM

## 2017-03-18 ENCOUNTER — Ambulatory Visit (INDEPENDENT_AMBULATORY_CARE_PROVIDER_SITE_OTHER): Payer: Medicare Other | Admitting: Family Medicine

## 2017-03-18 ENCOUNTER — Encounter: Payer: Self-pay | Admitting: Family Medicine

## 2017-03-18 VITALS — BP 126/74 | HR 75 | Temp 98.0°F | Ht 65.0 in | Wt 204.2 lb

## 2017-03-18 DIAGNOSIS — I10 Essential (primary) hypertension: Secondary | ICD-10-CM

## 2017-03-18 DIAGNOSIS — E782 Mixed hyperlipidemia: Secondary | ICD-10-CM

## 2017-03-18 DIAGNOSIS — I25119 Atherosclerotic heart disease of native coronary artery with unspecified angina pectoris: Secondary | ICD-10-CM

## 2017-03-18 DIAGNOSIS — F900 Attention-deficit hyperactivity disorder, predominantly inattentive type: Secondary | ICD-10-CM | POA: Diagnosis not present

## 2017-03-18 DIAGNOSIS — Z5181 Encounter for therapeutic drug level monitoring: Secondary | ICD-10-CM

## 2017-03-18 DIAGNOSIS — Z1231 Encounter for screening mammogram for malignant neoplasm of breast: Secondary | ICD-10-CM

## 2017-03-18 DIAGNOSIS — Z23 Encounter for immunization: Secondary | ICD-10-CM

## 2017-03-18 DIAGNOSIS — E6609 Other obesity due to excess calories: Secondary | ICD-10-CM

## 2017-03-18 DIAGNOSIS — M2550 Pain in unspecified joint: Secondary | ICD-10-CM

## 2017-03-18 DIAGNOSIS — R7303 Prediabetes: Secondary | ICD-10-CM | POA: Diagnosis not present

## 2017-03-18 DIAGNOSIS — F1910 Other psychoactive substance abuse, uncomplicated: Secondary | ICD-10-CM | POA: Diagnosis not present

## 2017-03-18 DIAGNOSIS — M170 Bilateral primary osteoarthritis of knee: Secondary | ICD-10-CM | POA: Diagnosis not present

## 2017-03-18 DIAGNOSIS — Z1211 Encounter for screening for malignant neoplasm of colon: Secondary | ICD-10-CM | POA: Diagnosis not present

## 2017-03-18 DIAGNOSIS — E039 Hypothyroidism, unspecified: Secondary | ICD-10-CM

## 2017-03-18 DIAGNOSIS — Z6833 Body mass index (BMI) 33.0-33.9, adult: Secondary | ICD-10-CM

## 2017-03-18 DIAGNOSIS — E038 Other specified hypothyroidism: Secondary | ICD-10-CM

## 2017-03-18 NOTE — Assessment & Plan Note (Signed)
Check vit D 

## 2017-03-18 NOTE — Assessment & Plan Note (Signed)
Used to drink beer, and now says she just drinks wine; she does not feel she has a problem now with alcohol, but did after her husband died; she went through the grieving; no recreational drugs; advised that she should stay vigilant and watch for signs of alcohol escalation

## 2017-03-18 NOTE — Assessment & Plan Note (Signed)
So proud of patient for giving up salt; continue medicine

## 2017-03-18 NOTE — Assessment & Plan Note (Signed)
Suggested visit to nutritionist; patient declined; see AVS; check TSH

## 2017-03-18 NOTE — Progress Notes (Signed)
BP 126/74 (BP Location: Right Arm, Patient Position: Sitting, Cuff Size: Large)   Pulse 75   Temp 98 F (36.7 C) (Oral)   Ht 5\' 5"  (1.651 m)   Wt 204 lb 3.2 oz (92.6 kg)   SpO2 98%   BMI 33.98 kg/m    Subjective:    Patient ID: Linda Mejia, female    DOB: 04-Nov-1955, 61 y.o.   MRN: [id number]  HPI: Linda Mejia is a 61 y.o. female  Chief Complaint  Patient presents with  . Follow-up    Orders placed for mammo, and colonoscopy   . Weight Loss    Pt states that she has changed diet and has not lost any weight   . Anxiety    Pt states that she gets very sweaty when she thinks about leaving home, gets very anxious     HPI Patient is here for follow-up Her last visit with me was in August 2018 She has obesity She has been changing her diet, but hasn't been losing any weight Seldom has wine; cut out the beer in May Cut out salt and sugar Cut out cheese I asked about exercise; she said, well.., I'm afraid I'll hurt my back doing sit ups; she has a bad knee, left knee, might need surgery; has had shots in both knees No one in the family with thyroid disease, not sure Lab Results  Component Value Date   TSH 3.541 08/11/2016  good water drinker; taking B12 Not eating late in the evening High cholesterol; runs in the family most likely; father died of a heart attack, mother had a stroke; lots of heart trouble in the family; taking fish oil; could not tolerate statin; now on injection through cardiologist Lab Results  Component Value Date   CHOL 280 (H) 06/09/2016   HDL 59 06/09/2016   LDLCALC 194 (H) 06/09/2016   TRIG 135 06/09/2016   CHOLHDL 4.7 06/09/2016  CAD; sees cardiologist, Dr. Humphrey Rolls; difficulty with transportation Low calcium in hospital labs; not much time outdoors; has vit D at home but not taking, but will start back; does not drink milk; not many greens Prediabetes; last A1c 5.8 in April; she has cut out sweets; trying to lose weight; not drinking sugary  drinks Anxiety and ADHD; seeing psychiatrist Neck pain, taking aleve, no belly pain or blood in stool  Depression screen [facility] 2/9 03/18/2017 12/22/2016 12/02/2016 06/09/2016 12/27/2015  Decreased Interest 0 0 0 0 0  Down, Depressed, Hopeless 0 0 0 0 3  PHQ - 2 Score 0 0 0 0 3  Altered sleeping - 0 - - 1  Tired, decreased energy - 0 - - 3  Change in appetite - 0 - - 1  Feeling bad or failure about yourself  - 0 - - 2  Trouble concentrating - 0 - - 1  Moving slowly or fidgety/restless - 0 - - 1  Suicidal thoughts - 0 - - 1  PHQ-9 Score - 0 - - 13  Difficult doing work/chores - Not difficult at all - - Very difficult    Relevant past medical, surgical, family and social history reviewed Past Medical History:  Diagnosis Date  . Abnormal ankle brachial index (ABI) 12/02/2016  . ADHD (attention deficit hyperactivity disorder)   . Anxiety   . Aortic atherosclerosis ([place]) 12/02/2016   July 2018  . Chronic back pain   . Coronary artery disease   . Herpes genitalis in women   . History of  stomach ulcers    x7 this year. Bleeding  . Hypertension   . Prediabetes 12/02/2016   Past Surgical History:  Procedure Laterality Date  . CARDIAC CATHETERIZATION    . CESAREAN SECTION    . ESOPHAGOGASTRODUODENOSCOPY (EGD) WITH PROPOFOL N/A 09/21/2015   Procedure: ESOPHAGOGASTRODUODENOSCOPY (EGD) WITH PROPOFOL;  Surgeon: Lucilla Lame, MD;  Location: [facility] ENDOSCOPY;  Service: Endoscopy;  Laterality: N/A;  . ESOPHAGOGASTRODUODENOSCOPY (EGD) WITH PROPOFOL N/A 07/30/2016   Procedure: ESOPHAGOGASTRODUODENOSCOPY (EGD) WITH PROPOFOL;  Surgeon: Jonathon Bellows, MD;  Location: [facility] ENDOSCOPY;  Service: Endoscopy;  Laterality: N/A;  . LEFT HEART CATH AND CORONARY ANGIOGRAPHY Right 08/12/2016   Procedure: Left Heart Cath and Coronary Angiography;  Surgeon: Dionisio David, MD;  Location: [place] [facility];  Service: Cardiovascular;  Laterality: Right;  . TONSILLECTOMY    . TUBAL LIGATION     Family History  Problem  Relation Age of Onset  . Diabetes Mother   . CVA Mother   . Heart disease Father   . Heart disease Sister   . Heart disease Brother   . Heart disease Sister   . Heart disease Sister   . Heart disease Sister   . Heart disease Brother   . Heart disease Brother   . Heart disease Brother    Social History   Tobacco Use  . Smoking status: Former Smoker    Packs/day: 1.00    Years: 30.00    Pack years: 30.00  . Smokeless tobacco: Never Used  Substance Use Topics  . Alcohol use: Yes    Comment: occasionally  . Drug use: No    Interim medical history since last visit reviewed. Allergies and medications reviewed  Review of Systems Per HPI unless specifically indicated above     Objective:    BP 126/74 (BP Location: Right Arm, Patient Position: Sitting, Cuff Size: Large)   Pulse 75   Temp 98 F (36.7 C) (Oral)   Ht 5\' 5"  (1.651 m)   Wt 204 lb 3.2 oz (92.6 kg)   SpO2 98%   BMI 33.98 kg/m   Wt Readings from Last 3 Encounters:  03/18/17 204 lb 3.2 oz (92.6 kg)  01/28/17 201 lb 14.4 oz (91.6 kg)  01/07/17 203 lb (92.1 kg)    Physical Exam  Constitutional: She appears well-developed and well-nourished. No distress.  obese  HENT:  Head: Normocephalic and atraumatic.  Eyes: EOM are normal. No scleral icterus.  Neck: No thyromegaly present.  Cardiovascular: Normal rate, regular rhythm and normal heart sounds.  No murmur heard. Pulmonary/Chest: Effort normal and breath sounds normal. No respiratory distress. She has no wheezes.  Abdominal: Soft. Bowel sounds are normal. She exhibits no distension.  Musculoskeletal: She exhibits no edema.  Neurological: She is alert.  Skin: Skin is warm and dry. She is not diaphoretic. No pallor.  Psychiatric: She has a normal mood and affect. Her behavior is normal. Judgment and thought content normal.   Results for orders placed or performed in visit on 01/28/17  WET PREP BY MOLECULAR PROBE  Result Value Ref Range   MICRO NUMBER:  [id number]    SPECIMEN QUALITY: ADEQUATE    SOURCE: NOT GIVEN    STATUS: FINAL    Trichomonas vaginosis Not Detected    Gardnerella vaginalis Not Detected    Candida species Not Detected   C. trachomatis/N. gonorrhoeae RNA  Result Value Ref Range   C. trachomatis RNA, TMA NOT DETECTED NOT DETECT   N. gonorrhoeae RNA, TMA NOT DETECTED  NOT DETECT  POCT urinalysis dipstick  Result Value Ref Range   Color, UA yellow    Clarity, UA clear    Glucose, UA negative    Bilirubin, UA negative    Ketones, UA negative    Spec Grav, UA 1.010 1.010 - 1.025   Blood, UA negative    pH, UA 5.0 5.0 - 8.0   Protein, UA negative    Urobilinogen, UA negative (A) 0.2 or 1.0 E.U./dL   Nitrite, UA negative    Leukocytes, UA Negative Negative      Assessment & Mejia:   Problem List Items Addressed This Visit      Cardiovascular and Mediastinum   Hypertension goal BP (blood pressure) < 140/90 (Chronic)    So proud of patient for giving up salt; continue medicine      Coronary artery disease involving native coronary artery of native heart with angina pectoris (HCC) (Chronic)    Patient to see cardiologist        Endocrine   Subclinical hypothyroidism    Check thyroid      Relevant Orders   TSH     Musculoskeletal and Integument   Degenerative arthritis of knee, bilateral    Suggested weight loss and turmeric        Other   Prediabetes - Primary    Check A1c today; praise given for diet changes      Relevant Orders   Hemoglobin A1c   Obesity    Suggested visit to nutritionist; patient declined; see AVS; check TSH      Multiple joint pain    Check vit D      Relevant Orders   VITAMIN D 25 Hydroxy (Vit-D Deficiency, Fractures)   Medication monitoring encounter    Check liver and kidneys      Relevant Orders   COMPLETE METABOLIC PANEL WITH GFR   HLD (hyperlipidemia)    Check fasting lipids; proud of patient for giving up cheese      Relevant Orders   Lipid panel    Colon cancer screening    Discussed Cologuard      Relevant Orders   Cologuard   ADHD (attention deficit hyperactivity disorder), inattentive type    Inattentive per patient, seeing Dr. Kasandra Knudsen      Abuse, drug or alcohol ([place])    Used to drink beer, and now says she just drinks wine; she does not feel she has a problem now with alcohol, but did after her husband died; she went through the grieving; no recreational drugs; advised that she should stay vigilant and watch for signs of alcohol escalation       Other Visit Diagnoses    Flu vaccine need       Relevant Orders   Flu Vaccine QUAD 36+ mos IM (Completed)   Encounter for screening mammogram for breast cancer       ordered mammogram   Relevant Orders   MM DIGITAL SCREENING BILATERAL   Encounter for screening colonoscopy          Follow up Mejia: Return in about 6 months (around 09/16/2017) for twenty minute follow-up with fasting labs.  An after-visit summary was printed and given to the patient at [facility].  Please see the patient instructions which may contain other information and recommendations beyond what is mentioned above in the assessment and Mejia.  No orders of the defined types were placed in this encounter.   Orders Placed This Encounter  Procedures  .  MM DIGITAL SCREENING BILATERAL  . Flu Vaccine QUAD 36+ mos IM  . Lipid panel  . Hemoglobin A1c  . COMPLETE METABOLIC PANEL WITH GFR  . TSH  . VITAMIN D 25 Hydroxy (Vit-D Deficiency, Fractures)  . Cologuard

## 2017-03-18 NOTE — Assessment & Plan Note (Signed)
Check liver and kidneys 

## 2017-03-18 NOTE — Assessment & Plan Note (Signed)
Check fasting lipids; proud of patient for giving up cheese

## 2017-03-18 NOTE — Assessment & Plan Note (Signed)
Check thyroid   

## 2017-03-18 NOTE — Assessment & Plan Note (Signed)
Suggested weight loss and turmeric

## 2017-03-18 NOTE — Assessment & Plan Note (Signed)
Inattentive per patient, seeing Dr. Kasandra Knudsen

## 2017-03-18 NOTE — Assessment & Plan Note (Signed)
Check A1c today; praise given for diet changes

## 2017-03-18 NOTE — Assessment & Plan Note (Signed)
Discussed Cologuard

## 2017-03-18 NOTE — Assessment & Plan Note (Signed)
Patient to see cardiologist

## 2017-03-18 NOTE — Patient Instructions (Addendum)
  Check out the information at familydoctor.org entitled "Nutrition for Weight Loss: What You Need to Know about Fad Diets" Try to lose between 1-2 pounds per week by taking in fewer calories and burning off more calories You can succeed by limiting portions, limiting foods dense in calories and fat, becoming more active, and drinking 8 glasses of water a day (64 ounces) Don't skip meals, especially breakfast, as skipping meals may alter your metabolism Do not use over-the-counter weight loss pills or gimmicks that claim rapid weight loss A healthy BMI (or body mass index) is between 18.5 and 24.9 You can calculate your ideal BMI at the [place] website ClubMonetize.fr  Try turmeric as a natural anti-inflammatory (for pain and arthritis). It comes in capsules where you buy aspirin and fish oil, but also as a spice where you buy pepper and garlic powder.  Try toning exercises that don't strain your back or knees  Try to limit saturated fats in your diet (bologna, hot dogs, barbeque, cheeseburgers, hamburgers, steak, bacon, sausage, cheese, etc.) and get more fresh fruits, vegetables, and whole grains

## 2017-03-19 LAB — HEMOGLOBIN A1C
Hgb A1c MFr Bld: 5.8 % of total Hgb — ABNORMAL HIGH (ref <5.7)
Mean Plasma Glucose: 120 (calc)
eAG (mmol/L): 6.6 (calc)

## 2017-03-19 LAB — LIPID PANEL
Cholesterol: 189 mg/dL (ref <200)
HDL: 55 mg/dL (ref >50)
LDL Cholesterol (Calc): 107 mg/dL (calc) — ABNORMAL HIGH
Non-HDL Cholesterol (Calc): 134 mg/dL (calc) — ABNORMAL HIGH (ref <130)
Total CHOL/HDL Ratio: 3.4 (calc) (ref <5.0)
Triglycerides: 156 mg/dL — ABNORMAL HIGH (ref <150)

## 2017-03-19 LAB — COMPLETE METABOLIC PANEL WITH GFR
AG Ratio: 1.6 (calc) (ref 1.0–2.5)
ALT: 19 U/L (ref 6–29)
AST: 29 U/L (ref 10–35)
Albumin: 4.2 g/dL (ref 3.6–5.1)
Alkaline phosphatase (APISO): 62 U/L (ref 33–130)
BUN: 13 mg/dL (ref 7–25)
CO2: 31 mmol/L (ref 20–32)
Calcium: 9 mg/dL (ref 8.6–10.4)
Chloride: 100 mmol/L (ref 98–110)
Creat: 0.91 mg/dL (ref 0.50–0.99)
GFR, Est African American: 79 mL/min/{1.73_m2} (ref >=60)
GFR, Est Non African American: 69 mL/min/{1.73_m2} (ref >=60)
Globulin: 2.6 g/dL (calc) (ref 1.9–3.7)
Glucose, Bld: 96 mg/dL (ref 65–99)
Potassium: 4.2 mmol/L (ref 3.5–5.3)
Sodium: 138 mmol/L (ref 135–146)
Total Bilirubin: 0.4 mg/dL (ref 0.2–1.2)
Total Protein: 6.8 g/dL (ref 6.1–8.1)

## 2017-03-19 LAB — TSH: TSH: 1.69 mIU/L (ref 0.40–4.50)

## 2017-03-19 LAB — VITAMIN D 25 HYDROXY (VIT D DEFICIENCY, FRACTURES): Vit D, 25-Hydroxy: 28 ng/mL — ABNORMAL LOW (ref 30–100)

## 2017-04-27 ENCOUNTER — Telehealth: Payer: Self-pay | Admitting: Family Medicine

## 2017-04-27 NOTE — Telephone Encounter (Signed)
Pt states she is unable to take statins. And she wants to know why Dr. Sanda Klein would want to change her from taking Praluent? Please advise.

## 2017-04-27 NOTE — Telephone Encounter (Signed)
Patient is on Praluent for her cholesterol However, I would like to put her on a very low dose statin if that's okay Please ask her what her experience is with statins, and if we could even have her take just a low dose pill even once a week, any little bit would be helpful Thank you

## 2017-04-27 NOTE — Telephone Encounter (Signed)
I called patient, left detailed message I never meant for her to stop the Praluent The low dose statin would have been in addition to the Praluent Do not stop that There is some data that a little statin is better than no statin, just hoping we could have tried 10 mg once a week, but understand she can't take it

## 2017-04-27 NOTE — Telephone Encounter (Signed)
Called pt, no answer. LM for pt informing her of the information below. Advised pt to call back with experience regarding statin drugs. CRM created.

## 2017-06-01 DIAGNOSIS — Z79899 Other long term (current) drug therapy: Secondary | ICD-10-CM | POA: Diagnosis not present

## 2017-06-10 ENCOUNTER — Other Ambulatory Visit: Payer: Self-pay

## 2017-06-10 ENCOUNTER — Encounter: Payer: Self-pay | Admitting: Family Medicine

## 2017-06-10 ENCOUNTER — Ambulatory Visit (INDEPENDENT_AMBULATORY_CARE_PROVIDER_SITE_OTHER): Payer: Medicare Other | Admitting: Family Medicine

## 2017-06-10 ENCOUNTER — Other Ambulatory Visit: Payer: Self-pay | Admitting: Family Medicine

## 2017-06-10 ENCOUNTER — Encounter: Payer: Medicare Other | Admitting: Family Medicine

## 2017-06-10 VITALS — BP 126/72 | HR 86 | Temp 98.0°F | Resp 16 | Ht 65.0 in | Wt 203.7 lb

## 2017-06-10 DIAGNOSIS — I25119 Atherosclerotic heart disease of native coronary artery with unspecified angina pectoris: Secondary | ICD-10-CM

## 2017-06-10 DIAGNOSIS — H00025 Hordeolum internum left lower eyelid: Secondary | ICD-10-CM

## 2017-06-10 DIAGNOSIS — I10 Essential (primary) hypertension: Secondary | ICD-10-CM

## 2017-06-10 MED ORDER — ERYTHROMYCIN 5 MG/GM OP OINT: 1.0000 "application " | TOPICAL_OINTMENT | Freq: Three times a day (TID) | OPHTHALMIC | 0 refills | Status: DC

## 2017-06-10 MED ORDER — METOPROLOL TARTRATE 50 MG PO TABS: 50.0000 mg | ORAL_TABLET | Freq: Two times a day (BID) | ORAL | 5 refills | Status: DC

## 2017-06-10 NOTE — Progress Notes (Signed)
Name: Linda Mejia   MRN: [id number]    DOB: August 10, 1955   Date:06/10/2017       Progress Note  Subjective  Chief Complaint  Chief Complaint  Patient presents with  . Eye Pain    left eye irritated for 3 days    HPI  PT presents with 3 days of LEFT lower lid irritation, she noted a small stye in the middle of the internal left lower lid.  She reports some pain as well as itching and mild erythema.  She denies any injury to the area.  Denies vision changes, fevers/chills or swelling to the area.  Patient Active Problem List   Diagnosis Date Noted  . Medication monitoring encounter 03/18/2017  . Colon cancer screening 03/18/2017  . Coronary artery calcification seen on CT scan 12/02/2016  . Aortic atherosclerosis ([place]) 12/02/2016  . Abnormal ankle brachial index (ABI) 12/02/2016  . Prediabetes 12/02/2016  . Fracture of rib 08/28/2016  . Chest pain 08/11/2016  . Hx of tobacco use, presenting hazards to health 06/09/2016  . Degenerative arthritis of knee, bilateral 12/27/2015  . Obesity 12/27/2015  . GI bleed 09/21/2015  . Reflux esophagitis   . Other diseases of stomach and duodenum   . Multiple joint pain 06/05/2015  . Night sweats 06/05/2015  . Headache 04/12/2015  . Subclinical hypothyroidism 03/26/2015  . Herpes simplex type 2 infection 03/21/2015  . Rhinitis, allergic 03/21/2015  . Cervical pain 09/21/2014  . ADHD (attention deficit hyperactivity disorder), inattentive type 09/20/2014  . Episodic paroxysmal anxiety disorder 09/20/2014  . History of gastric ulcer 09/20/2014  . Abuse, drug or alcohol ([place]) 09/20/2014  . Substance abuse ([place]) 09/20/2014  . HLD (hyperlipidemia) 10/03/2009  . Coronary artery disease involving native coronary artery of native heart with angina pectoris ([place]) 10/02/2009  . Hypertension goal BP (blood pressure) < 140/90 10/02/2009    Social History   Tobacco Use  . Smoking status: Former Smoker    Packs/day: 1.00    Years: 30.00    Pack  years: 30.00  . Smokeless tobacco: Never Used  Substance Use Topics  . Alcohol use: Yes    Comment: occasionally     Current Outpatient Medications:  .  acyclovir (ZOVIRAX) 400 MG tablet, take 1 tablet by mouth twice a day, Disp: 60 tablet, Rfl: 5 .  amphetamine-dextroamphetamine (ADDERALL) 30 MG tablet, Take 1 tablet by mouth 2 (two) times daily., Disp: , Rfl:  .  aspirin EC 81 MG tablet, Take 81 mg by mouth daily., Disp: , Rfl:  .  diazepam (VALIUM) 10 MG tablet, Take 1 tablet (10 mg total) by mouth every 8 (eight) hours as needed for anxiety., Disp: 30 tablet, Rfl: 0 .  fluticasone (FLONASE) 50 MCG/ACT nasal spray, Place 2 sprays into both nostrils daily. (Patient taking differently: Place 2 sprays into both nostrils as needed. ), Disp: 16 g, Rfl: 11 .  metoprolol (LOPRESSOR) 50 MG tablet, Take 1 tablet (50 mg total) by mouth 2 (two) times daily., Disp: 60 tablet, Rfl: 11 .  PRALUENT 75 MG/ML SOPN, Inject 75 mLs as directed every 14 (fourteen) days., Disp: , Rfl:   No Known Allergies  ROS  Constitutional: Negative for fever or weight change.  Respiratory: Negative for cough and shortness of breath.   Cardiovascular: Negative for chest pain or palpitations.  Gastrointestinal: Negative for abdominal pain, no bowel changes.  Musculoskeletal: Negative for gait problem or joint swelling.  Skin: Negative for rash.  Neurological: Negative for dizziness or  headache.  No other specific complaints in a complete review of systems (except as listed in HPI above).  Objective  Vitals:   06/10/17 1134  BP: 126/72  Pulse: 86  Resp: 16  Temp: 98 F (36.7 C)  TempSrc: Oral  SpO2: 95%  Weight: 203 lb 11.2 oz (92.4 kg)  Height: 5\' 5"  (1.651 m)    Body mass index is 33.9 kg/m.  Nursing Note and Vital Signs reviewed.  Physical Exam  Constitutional: Patient appears well-developed and well-nourished. Obese. No distress.  HEENT: head atraumatic, normocephalic, pupils equal and reactive  to light, EOM's intact; LEFT lower lid presents with centrally located small internal hordeolum, mild surrounding erythema without edema, mild tenderness on palpation. No maxillary or frontal sinus tenderness , neck supple without lymphadenopathy, oropharynx pink and moist without exudate Cardiovascular: Normal rate, regular rhythm, S1/S2 present.  No murmur or rub heard. No BLE edema. Pulmonary/Chest: Effort normal and breath sounds clear. No respiratory distress or retractions. Psychiatric: Patient has a normal mood and affect. behavior is normal. Judgment and thought content normal.  No results found for this or any previous visit (from the past 72 hour(s)).  Assessment & Plan  1. Hordeolum internum of left lower eyelid - Warm compresses multiple times a day. - erythromycin ophthalmic ointment; Place 1 application into the left eye 3 (three) times daily.  Dispense: 3.5 g; Refill: 0 -Red flags and when to present for emergency care or RTC including fever >101.71F, new/worsening/un-resolving symptoms, pain with ocular movement or periorbital swelling, reviewed with patient at time of visit. Follow up and care instructions discussed and provided in AVS.

## 2017-06-10 NOTE — Patient Instructions (Addendum)

## 2017-06-12 ENCOUNTER — Ambulatory Visit (INDEPENDENT_AMBULATORY_CARE_PROVIDER_SITE_OTHER): Payer: Medicare Other | Admitting: Family Medicine

## 2017-06-12 ENCOUNTER — Encounter: Payer: Self-pay | Admitting: Family Medicine

## 2017-06-12 VITALS — BP 132/84 | HR 98 | Temp 97.5°F | Resp 16 | Ht 65.0 in | Wt 201.6 lb

## 2017-06-12 DIAGNOSIS — S8992XA Unspecified injury of left lower leg, initial encounter: Secondary | ICD-10-CM | POA: Diagnosis not present

## 2017-06-12 DIAGNOSIS — M1712 Unilateral primary osteoarthritis, left knee: Secondary | ICD-10-CM | POA: Diagnosis not present

## 2017-06-12 DIAGNOSIS — M170 Bilateral primary osteoarthritis of knee: Secondary | ICD-10-CM

## 2017-06-12 NOTE — Progress Notes (Signed)
Name: Linda Mejia   MRN: [id number]    DOB: Feb 09, 1956   Date:06/12/2017       Progress Note  Subjective  Chief Complaint  Chief Complaint  Patient presents with  . Knee Pain    left knee pain today    HPI  PT presents with left knee pain - she twisted her LEFT knee while lifting a chair at her daughter's apartment this morning. She has a history of arthritis and chronic left knee pain.  She called her orthopedist (Dr. Mack Guise) at [facility] and they were not able to see her today - they have done steroid injections in the past, is considering gel injection in the future.  She notes significant pain on ambulation in the popliteal fossa with some sensation of tightness.   Denies numbness/tingling, no bruising.  Patient Active Problem List   Diagnosis Date Noted  . Medication monitoring encounter 03/18/2017  . Colon cancer screening 03/18/2017  . Coronary artery calcification seen on CT scan 12/02/2016  . Aortic atherosclerosis ([place]) 12/02/2016  . Abnormal ankle brachial index (ABI) 12/02/2016  . Prediabetes 12/02/2016  . Fracture of rib 08/28/2016  . Chest pain 08/11/2016  . Hx of tobacco use, presenting hazards to health 06/09/2016  . Degenerative arthritis of knee, bilateral 12/27/2015  . Obesity 12/27/2015  . GI bleed 09/21/2015  . Reflux esophagitis   . Other diseases of stomach and duodenum   . Multiple joint pain 06/05/2015  . Night sweats 06/05/2015  . Headache 04/12/2015  . Subclinical hypothyroidism 03/26/2015  . Herpes simplex type 2 infection 03/21/2015  . Rhinitis, allergic 03/21/2015  . Cervical pain 09/21/2014  . ADHD (attention deficit hyperactivity disorder), inattentive type 09/20/2014  . Episodic paroxysmal anxiety disorder 09/20/2014  . History of gastric ulcer 09/20/2014  . Abuse, drug or alcohol ([place]) 09/20/2014  . Substance abuse ([place]) 09/20/2014  . HLD (hyperlipidemia) 10/03/2009  . Coronary artery disease involving native coronary artery of native  heart with angina pectoris ([place]) 10/02/2009  . Hypertension goal BP (blood pressure) < 140/90 10/02/2009    Social History   Tobacco Use  . Smoking status: Former Smoker    Packs/day: 1.00    Years: 30.00    Pack years: 30.00  . Smokeless tobacco: Never Used  Substance Use Topics  . Alcohol use: Yes    Comment: occasionally     Current Outpatient Medications:  .  acyclovir (ZOVIRAX) 400 MG tablet, take 1 tablet by mouth twice a day, Disp: 60 tablet, Rfl: 5 .  amphetamine-dextroamphetamine (ADDERALL) 30 MG tablet, Take 1 tablet by mouth 2 (two) times daily., Disp: , Rfl:  .  aspirin EC 81 MG tablet, Take 81 mg by mouth daily., Disp: , Rfl:  .  diazepam (VALIUM) 10 MG tablet, Take 1 tablet (10 mg total) by mouth every 8 (eight) hours as needed for anxiety., Disp: 30 tablet, Rfl: 0 .  erythromycin ophthalmic ointment, Place 1 application into the left eye 3 (three) times daily., Disp: 3.5 g, Rfl: 0 .  fluticasone (FLONASE) 50 MCG/ACT nasal spray, Place 2 sprays into both nostrils daily. (Patient taking differently: Place 2 sprays into both nostrils as needed. ), Disp: 16 g, Rfl: 11 .  metoprolol tartrate (LOPRESSOR) 50 MG tablet, Take 1 tablet (50 mg total) by mouth 2 (two) times daily., Disp: 60 tablet, Rfl: 5 .  PRALUENT 75 MG/ML SOPN, Inject 75 mLs as directed every 14 (fourteen) days., Disp: , Rfl:   No Known Allergies  ROS  Constitutional: Negative for fever or weight change.  Respiratory: Negative for cough and shortness of breath.   Cardiovascular: Negative for chest pain or palpitations.  Gastrointestinal: Negative for abdominal pain, no bowel changes.  Musculoskeletal: See HPI Skin: Negative for rash.  Neurological: Negative for dizziness or headache.  No other specific complaints in a complete review of systems (except as listed in HPI above).  Objective  Vitals:   06/12/17 1054  BP: 132/84  Pulse: 98  Resp: 16  Temp: (!) 97.5 F (36.4 C)  TempSrc: Oral   SpO2: 94%  Weight: 201 lb 9.6 oz (91.4 kg)  Height: 5\' 5"  (1.651 m)   Body mass index is 33.55 kg/m.  Nursing Note and Vital Signs reviewed.  Physical Exam  Constitutional: Patient appears well-developed and well-nourished. Obese No distress.  HEENT: head atraumatic, normocephalic Cardiovascular: Normal rate, regular rhythm, S1/S2 present.  No murmur or rub heard. No BLE edema. Pulmonary/Chest: Effort normal and breath sounds clear. No respiratory distress or retractions. Psychiatric: Patient has a normal mood and affect. behavior is normal. Judgment and thought content normal. Musculoskeletal: Normal range of motion, strength is +5 in BLE, no laxity.  LEFT popliteal fossa has area of palpable edema, pulse +2.  No ecchymosis, no anterior swelling. Neurological: he is alert and oriented to person, place, and time. No cranial nerve deficit. Coordination, balance, strength, speech and gait are normal.  Skin: Skin is warm and dry. No rash noted. No erythema.   No results found for this or any previous visit (from the past 72 hour(s)).  Assessment & Plan  1. Injury of left knee, initial encounter - Ambulatory referral to Orthopedic Surgery - Question if baker's cyst present vs simple edema from acute injury.  Pt is established with [facility] and prefers to see them if possible.  Advised we will attempt to make appointment today, if unable, she may present to their walk-in clinic from 1pm-7pm today for further evaluation. - Discussed pain management  Options - she has a history of GI bleed and is unable to take NSAIDS. I recommend RICE until able to see Orthopedist.  2. Primary osteoarthritis of both knees - Ambulatory referral to Orthopedic Surgery

## 2017-06-12 NOTE — Patient Instructions (Addendum)
RICE for Routine Care of Injuries Many injuries can be cared for using rest, ice, compression, and elevation (RICE therapy). Using RICE therapy can help to lessen pain and swelling. It can help your body to heal. Rest Reduce your normal activities and avoid using the injured part of your body. You can go back to your normal activities when you feel okay and your doctor says it is okay. Ice Do not put ice on your bare skin.  Put ice in a plastic bag.  Place a towel between your skin and the bag.  Leave the ice on for 20 minutes, 2-3 times a day.  Do this for as long as told by your doctor. Compression Compression means putting pressure on the injured area. This can be done with an elastic bandage. If an elastic bandage has been applied:  Remove and reapply the bandage every 3-4 hours or as told by your doctor.  Make sure the bandage is not wrapped too tight. Wrap the bandage more loosely if part of your body beyond the bandage is blue, swollen, cold, painful, or loses feeling (numb).  See your doctor if the bandage seems to make your problems worse.  Elevation Elevation means keeping the injured area raised. Raise the injured area above your heart or the center of your chest if you can. When should I get help? You should get help if:  You keep having pain and swelling.  Your symptoms get worse.  Get help right away if: You should get help right away if:  You have sudden bad pain at or below the area of your injury.  You have redness or more swelling around your injury.  You have tingling or numbness at or below the injury that does not go away when you take off the bandage.  This information is not intended to replace advice given to you by your health care provider. Make sure you discuss any questions you have with your health care provider. Document Released: 09/17/2007 Document Revised: 02/26/2016 Document Reviewed: 03/08/2014 Elsevier Interactive Patient Education  2017  [employer]vier Inc[employer]  

## 2017-06-15 DIAGNOSIS — Z1212 Encounter for screening for malignant neoplasm of rectum: Secondary | ICD-10-CM | POA: Diagnosis not present

## 2017-06-15 DIAGNOSIS — Z1211 Encounter for screening for malignant neoplasm of colon: Secondary | ICD-10-CM | POA: Diagnosis not present

## 2017-06-18 ENCOUNTER — Other Ambulatory Visit: Payer: Self-pay | Admitting: Family Medicine

## 2017-06-18 DIAGNOSIS — B3731 Acute candidiasis of vulva and vagina: Secondary | ICD-10-CM

## 2017-06-18 DIAGNOSIS — B373 Candidiasis of vulva and vagina: Secondary | ICD-10-CM

## 2017-06-19 ENCOUNTER — Ambulatory Visit (INDEPENDENT_AMBULATORY_CARE_PROVIDER_SITE_OTHER): Payer: Medicare Other | Admitting: Family Medicine

## 2017-06-19 ENCOUNTER — Encounter: Payer: Self-pay | Admitting: Family Medicine

## 2017-06-19 VITALS — BP 140/86 | HR 80 | Temp 98.3°F | Resp 16 | Wt 198.8 lb

## 2017-06-19 DIAGNOSIS — Z6833 Body mass index (BMI) 33.0-33.9, adult: Secondary | ICD-10-CM

## 2017-06-19 DIAGNOSIS — B3731 Acute candidiasis of vulva and vagina: Secondary | ICD-10-CM

## 2017-06-19 DIAGNOSIS — J0100 Acute maxillary sinusitis, unspecified: Secondary | ICD-10-CM | POA: Diagnosis not present

## 2017-06-19 DIAGNOSIS — B373 Candidiasis of vulva and vagina: Secondary | ICD-10-CM

## 2017-06-19 DIAGNOSIS — E6609 Other obesity due to excess calories: Secondary | ICD-10-CM

## 2017-06-19 DIAGNOSIS — J029 Acute pharyngitis, unspecified: Secondary | ICD-10-CM | POA: Diagnosis not present

## 2017-06-19 DIAGNOSIS — I10 Essential (primary) hypertension: Secondary | ICD-10-CM | POA: Diagnosis not present

## 2017-06-19 MED ORDER — AMOXICILLIN-POT CLAVULANATE 875-125 MG PO TABS: 1.0000 | ORAL_TABLET | Freq: Two times a day (BID) | ORAL | 0 refills | Status: DC

## 2017-06-19 MED ORDER — FLUCONAZOLE 150 MG PO TABS: 150.0000 mg | ORAL_TABLET | Freq: Once | ORAL | 0 refills | Status: AC

## 2017-06-19 MED ORDER — BENZONATATE 100 MG PO CAPS: 100.0000 mg | ORAL_CAPSULE | Freq: Two times a day (BID) | ORAL | 0 refills | Status: DC | PRN

## 2017-06-19 NOTE — Assessment & Plan Note (Signed)
Praise given for weight loss and healthy lifestyle habits

## 2017-06-19 NOTE — Progress Notes (Signed)
BP 140/86   Pulse 80   Temp 98.3 F (36.8 C) (Oral)   Resp 16   Wt 198 lb 12.8 oz (90.2 kg)   SpO2 94%   BMI 33.08 kg/m    Subjective:    Patient ID: Linda Mejia, female    DOB: [birth year], 62 y.o.   MRN: [id number]  HPI: Linda Mejia is a 62 y.o. female  Chief Complaint  Patient presents with  . URI    cough, sore throat, hot flashes, wheezing, runny nose and headache    HPI Patient is here for an acute visit She started to get sick on: Wednesday Early symptoms included: sore throat; lost voice, headache and coughing (productive), blowing out stuff from nose Other symptoms: wheezing;  Pertinent negatives: n/v, diarrhea, rash Further details: used to smoke, 10 years ago, recurrent bronchitis Remedies tried: drinking water, ibuprofen OTC Sick contacts: going to gym, no one known   She has started working out; complete change in lifestyle; losing weight; not drinking any alcohol at all now; no beer since last June; then had one; then started having a glass of wine and it started tasted good, just quit it; prayed about it  Yeast infection from working out; asked for diflucan  HTN; not picked up her medicine; asking for ibuprofen Rx (explained, no, can run up BP, lead to heart attack or stroke)  Depression screen [facility] 2/9 06/19/2017 03/18/2017 12/22/2016 12/02/2016 06/09/2016  Decreased Interest 0 0 0 0 0  Down, Depressed, Hopeless 0 0 0 0 0  PHQ - 2 Score 0 0 0 0 0  Altered sleeping - - 0 - -  Tired, decreased energy - - 0 - -  Change in appetite - - 0 - -  Feeling bad or failure about yourself  - - 0 - -  Trouble concentrating - - 0 - -  Moving slowly or fidgety/restless - - 0 - -  Suicidal thoughts - - 0 - -  PHQ-9 Score - - 0 - -  Difficult doing work/chores - - Not difficult at all - -    Relevant past medical, surgical, family and social history reviewed Past Medical History:  Diagnosis Date  . Abnormal ankle brachial index (ABI) 12/02/2016  . ADHD (attention deficit  hyperactivity disorder)   . Anxiety   . Aortic atherosclerosis ([place]) 12/02/2016   July 2018  . Chronic back pain   . Coronary artery disease   . Herpes genitalis in women   . History of stomach ulcers    x7 this year. Bleeding  . Hypertension   . Prediabetes 12/02/2016   Past Surgical History:  Procedure Laterality Date  . CARDIAC CATHETERIZATION    . CESAREAN SECTION    . ESOPHAGOGASTRODUODENOSCOPY (EGD) WITH PROPOFOL N/A 09/21/2015   Procedure: ESOPHAGOGASTRODUODENOSCOPY (EGD) WITH PROPOFOL;  Surgeon: Lucilla Lame, MD;  Location: [facility] ENDOSCOPY;  Service: Endoscopy;  Laterality: N/A;  . ESOPHAGOGASTRODUODENOSCOPY (EGD) WITH PROPOFOL N/A 07/30/2016   Procedure: ESOPHAGOGASTRODUODENOSCOPY (EGD) WITH PROPOFOL;  Surgeon: Jonathon Bellows, MD;  Location: [facility] ENDOSCOPY;  Service: Endoscopy;  Laterality: N/A;  . LEFT HEART CATH AND CORONARY ANGIOGRAPHY Right 08/12/2016   Procedure: Left Heart Cath and Coronary Angiography;  Surgeon: Dionisio David, MD;  Location: [place] [facility];  Service: Cardiovascular;  Laterality: Right;  . TONSILLECTOMY    . TUBAL LIGATION     Family History  Problem Relation Age of Onset  . Diabetes Mother   . CVA Mother   .  Heart disease Father   . Heart disease Sister   . Heart disease Brother   . Heart disease Sister   . Heart disease Sister   . Heart disease Sister   . Heart disease Brother   . Heart disease Brother   . Heart disease Brother    Social History   Tobacco Use  . Smoking status: Former Smoker    Packs/day: 1.00    Years: 30.00    Pack years: 30.00  . Smokeless tobacco: Never Used  Substance Use Topics  . Alcohol use: No    Frequency: Never    Comment: occasionally  . Drug use: No    Interim medical history since last visit reviewed. Allergies and medications reviewed  Review of Systems Per HPI unless specifically indicated above     Objective:    BP 140/86   Pulse 80   Temp 98.3 F (36.8 C) (Oral)   Resp 16   Wt 198 lb  12.8 oz (90.2 kg)   SpO2 94%   BMI 33.08 kg/m   Wt Readings from Last 3 Encounters:  06/19/17 198 lb 12.8 oz (90.2 kg)  06/12/17 201 lb 9.6 oz (91.4 kg)  06/10/17 203 lb 11.2 oz (92.4 kg)    Physical Exam  Constitutional: She appears well-developed and well-nourished.  HENT:  Right Ear: Tympanic membrane is not erythematous. No middle ear effusion.  Left Ear: Tympanic membrane is not erythematous.  No middle ear effusion.  Nose: Mucosal edema and rhinorrhea present.  Mouth/Throat: Mucous membranes are normal. Posterior oropharyngeal erythema present. No oropharyngeal exudate or posterior oropharyngeal edema.  Hoarse voice; yellowish material in the sinus passages  Eyes: EOM are normal. No scleral icterus.  Cardiovascular: Normal rate and regular rhythm.  Pulmonary/Chest: Effort normal and breath sounds normal. She has no decreased breath sounds. She has no wheezes. She has no rhonchi.  Lymphadenopathy:    She has cervical adenopathy ([place]).  Skin: She is not diaphoretic. No pallor.  Psychiatric: She has a normal mood and affect. Her mood appears not anxious. She does not exhibit a depressed mood.      Assessment & Plan:   Problem List Items Addressed This Visit      Cardiovascular and Mediastinum   Hypertension goal BP (blood pressure) < 140/90 (Chronic)    Tylenol per package directions; DASH guidelines encouraged        Other   Obesity    Praise given for weight loss and healthy lifestyle habits       Other Visit Diagnoses    Acute non-recurrent maxillary sinusitis    -  Primary   Relevant Medications   fluconazole (DIFLUCAN) 150 MG tablet   amoxicillin-clavulanate (AUGMENTIN) 875-125 MG tablet   benzonatate (TESSALON) 100 MG capsule   Pharyngitis, unspecified etiology       rest, hydration; will be treating sinusitis with augmentin; explained we can skip the test if she'll promise to take the whole course of antibiotics; she agrees   Vaginal yeast infection         diflucan x 1; going to be on augmentin, urged yogurt or kimchi or probiotics to prevent another on augment   Relevant Medications   fluconazole (DIFLUCAN) 150 MG tablet       Follow up plan: No Follow-up on file.  An after-visit summary was printed and given to the patient at [facility].  Please see the patient instructions which may contain other information and recommendations beyond what is mentioned above in  the assessment and plan.  Meds ordered this encounter  Medications  . fluconazole (DIFLUCAN) 150 MG tablet    Sig: Take 1 tablet (150 mg total) by mouth once for 1 dose.    Dispense:  1 tablet    Refill:  0  . amoxicillin-clavulanate (AUGMENTIN) 875-125 MG tablet    Sig: Take 1 tablet by mouth 2 (two) times daily.    Dispense:  20 tablet    Refill:  0  . benzonatate (TESSALON) 100 MG capsule    Sig: Take 1 capsule (100 mg total) by mouth 2 (two) times daily as needed for cough.    Dispense:  20 capsule    Refill:  0    No orders of the defined types were placed in this encounter.

## 2017-06-19 NOTE — Assessment & Plan Note (Signed)
Tylenol per package directions; DASH guidelines encouraged

## 2017-06-19 NOTE — Patient Instructions (Addendum)
Start the antibiotics Use the cough medicine if needed Try vitamin C (orange juice if not diabetic or vitamin C tablets) and drink green tea to help your immune system during your illness Get plenty of rest and hydration Please do eat yogurt or kimchi or take a probiotic daily for the next month We want to replace the healthy germs in the gut If you notice foul, watery diarrhea in the next two months, schedule an appointment RIGHT AWAY or go to an urgent care or the emergency room if a holiday or over a weekend   Sinusitis, Adult Sinusitis is soreness and inflammation of your sinuses. Sinuses are hollow spaces in the bones around your face. They are located:  Around your eyes.  In the middle of your forehead.  Behind your nose.  In your cheekbones.  Your sinuses and nasal passages are lined with a stringy fluid (mucus). Mucus normally drains out of your sinuses. When your nasal tissues get inflamed or swollen, the mucus can get trapped or blocked so air cannot flow through your sinuses. This lets bacteria, viruses, and funguses grow, and that leads to infection. Follow these instructions at home: Medicines  Take, use, or apply over-the-counter and prescription medicines only as told by your doctor. These may include nasal sprays.  If you were prescribed an antibiotic medicine, take it as told by your doctor. Do not stop taking the antibiotic even if you start to feel better. Hydrate and Humidify  Drink enough water to keep your pee (urine) clear or pale yellow.  Use a cool mist humidifier to keep the humidity level in your home above 50%.  Breathe in steam for 10-15 minutes, 3-4 times a day or as told by your doctor. You can do this in the bathroom while a hot shower is running.  Try not to spend time in cool or dry air. Rest  Rest as much as possible.  Sleep with your head raised (elevated).  Make sure to get enough sleep each night. General instructions  Put a warm,  moist washcloth on your face 3-4 times a day or as told by your doctor. This will help with discomfort.  Wash your hands often with soap and water. If there is no soap and water, use hand sanitizer.  Do not smoke. Avoid being around people who are smoking (secondhand smoke).  Keep all follow-up visits as told by your doctor. This is important. Contact a doctor if:  You have a fever.  Your symptoms get worse.  Your symptoms do not get better within 10 days. Get help right away if:  You have a very bad headache.  You cannot stop throwing up (vomiting).  You have pain or swelling around your face or eyes.  You have trouble seeing.  You feel confused.  Your neck is stiff.  You have trouble breathing. This information is not intended to replace advice given to you by your health care provider. Make sure you discuss any questions you have with your health care provider. Document Released: 09/17/2007 Document Revised: 11/25/2015 Document Reviewed: 01/24/2015 Elsevier Interactive Patient Education  [employer].

## 2017-06-22 ENCOUNTER — Telehealth: Payer: Self-pay | Admitting: Family Medicine

## 2017-06-22 DIAGNOSIS — R059 Cough, unspecified: Secondary | ICD-10-CM

## 2017-06-22 DIAGNOSIS — R05 Cough: Secondary | ICD-10-CM

## 2017-06-22 NOTE — Telephone Encounter (Signed)
Copied from [place] [id number]. Topic: Quick Communication - See Telephone Encounter >> Jun 22, 2017  9:04 AM Bea Graff, NT wrote: CRM for notification. See Telephone encounter for: Pt states she was seen on Friday and she is not feeling any better. She said she is coughing so much and her head, back and neck hurt. She wants to see if something can be called in to help her. Uses [facility] on [address].   06/22/17.

## 2017-06-22 NOTE — Telephone Encounter (Signed)
Called pt, informed her of information below. Pt states that she will seek care in ER.

## 2017-06-22 NOTE — Telephone Encounter (Signed)
I cannot prescribe anything stronger for her cough If she is coughing this badly, please send her for a chest xray OR she can go to urgent care

## 2017-06-24 LAB — COLOGUARD: Cologuard: NEGATIVE

## 2017-07-06 ENCOUNTER — Encounter: Payer: Medicare Other | Admitting: Family Medicine

## 2017-08-25 ENCOUNTER — Emergency Department
Admission: EM | Admit: 2017-08-25 | Discharge: 2017-08-26 | Disposition: A | Discharge status: Home / Self Care | Payer: Medicare Other | Attending: Emergency Medicine | Admitting: Emergency Medicine

## 2017-08-25 ENCOUNTER — Emergency Department: Payer: Medicare Other

## 2017-08-25 ENCOUNTER — Ambulatory Visit: Payer: Self-pay | Admitting: *Deleted

## 2017-08-25 ENCOUNTER — Encounter: Payer: Self-pay | Admitting: Emergency Medicine

## 2017-08-25 ENCOUNTER — Other Ambulatory Visit: Payer: Self-pay

## 2017-08-25 DIAGNOSIS — R1110 Vomiting, unspecified: Secondary | ICD-10-CM | POA: Diagnosis not present

## 2017-08-25 DIAGNOSIS — R112 Nausea with vomiting, unspecified: Secondary | ICD-10-CM | POA: Diagnosis not present

## 2017-08-25 DIAGNOSIS — Z87891 Personal history of nicotine dependence: Secondary | ICD-10-CM | POA: Insufficient documentation

## 2017-08-25 DIAGNOSIS — R1012 Left upper quadrant pain: Secondary | ICD-10-CM | POA: Diagnosis present

## 2017-08-25 DIAGNOSIS — Z79899 Other long term (current) drug therapy: Secondary | ICD-10-CM | POA: Diagnosis not present

## 2017-08-25 DIAGNOSIS — Z7982 Long term (current) use of aspirin: Secondary | ICD-10-CM | POA: Insufficient documentation

## 2017-08-25 DIAGNOSIS — I10 Essential (primary) hypertension: Secondary | ICD-10-CM | POA: Insufficient documentation

## 2017-08-25 DIAGNOSIS — I2510 Atherosclerotic heart disease of native coronary artery without angina pectoris: Secondary | ICD-10-CM | POA: Insufficient documentation

## 2017-08-25 DIAGNOSIS — R079 Chest pain, unspecified: Secondary | ICD-10-CM | POA: Diagnosis not present

## 2017-08-25 LAB — COMPREHENSIVE METABOLIC PANEL WITH GFR
ALT: 25 U/L (ref 14–54)
AST: 29 U/L (ref 15–41)
Albumin: 4.6 g/dL (ref 3.5–5.0)
Alkaline Phosphatase: 87 U/L (ref 38–126)
Anion gap: 10 (ref 5–15)
BUN: 19 mg/dL (ref 6–20)
CO2: 27 mmol/L (ref 22–32)
Calcium: 9.4 mg/dL (ref 8.9–10.3)
Chloride: 98 mmol/L — ABNORMAL LOW (ref 101–111)
Creatinine, Ser: 0.88 mg/dL (ref 0.44–1.00)
GFR calc Af Amer: >60 mL/min
GFR calc non Af Amer: >60 mL/min
Glucose, Bld: 110 mg/dL — ABNORMAL HIGH (ref 65–99)
Potassium: 4.6 mmol/L (ref 3.5–5.1)
Sodium: 135 mmol/L (ref 135–145)
Total Bilirubin: 0.4 mg/dL (ref 0.3–1.2)
Total Protein: 8.2 g/dL — ABNORMAL HIGH (ref 6.5–8.1)

## 2017-08-25 LAB — URINALYSIS, COMPLETE (UACMP) WITH MICROSCOPIC
Bacteria, UA: NONE SEEN
Bilirubin Urine: NEGATIVE
Glucose, UA: NEGATIVE mg/dL
Hgb urine dipstick: NEGATIVE
Ketones, ur: NEGATIVE mg/dL
Leukocytes, UA: NEGATIVE
Nitrite: NEGATIVE
Protein, ur: NEGATIVE mg/dL
Specific Gravity, Urine: 1.006 (ref 1.005–1.030)
Squamous Epithelial / LPF: NONE SEEN (ref 0–5)
pH: 5 (ref 5.0–8.0)

## 2017-08-25 LAB — CBC
HCT: 40.1 % (ref 35.0–47.0)
Hemoglobin: 13.8 g/dL (ref 12.0–16.0)
MCH: 32.8 pg (ref 26.0–34.0)
MCHC: 34.4 g/dL (ref 32.0–36.0)
MCV: 95.2 fL (ref 80.0–100.0)
Platelets: 412 10*3/uL (ref 150–440)
RBC: 4.21 MIL/uL (ref 3.80–5.20)
RDW: 13.6 % (ref 11.5–14.5)
WBC: 9.6 10*3/uL (ref 3.6–11.0)

## 2017-08-25 LAB — LIPASE, BLOOD: Lipase: 32 U/L (ref 11–51)

## 2017-08-25 LAB — TROPONIN I: Troponin I: <0.03 ng/mL

## 2017-08-25 MED ORDER — GI COCKTAIL ~~LOC~~
30.0000 mL | Freq: Once | ORAL | Status: AC
  Administered 2017-08-26: 30 mL via ORAL
  Filled 2017-08-25: qty 30

## 2017-08-25 MED ORDER — FAMOTIDINE IN NACL 20-0.9 MG/50ML-% IV SOLN
20.0000 mg | Freq: Once | INTRAVENOUS | Status: AC
  Administered 2017-08-26: 20 mg via INTRAVENOUS
  Filled 2017-08-25: qty 50

## 2017-08-25 NOTE — Telephone Encounter (Signed)
Patient is calling to get advisement from office on pain she has been having in her chest and legs. Patient states she has to get up out of bed last night because she was hurting so bad. Patient worked through triage and she is advised to go to ED for evaluation. She states her daughter is graduating in [employer] and she will go after that. Advised patient if she should have symptoms at any time during the evening no matter where she is- go to ED. She agrees. Told patient I would notified her PCP.  Reason for Disposition . [1] Intermittent  chest pain or "angina" AND [2] increasing in severity or frequency  (Exception: pains lasting a few seconds)  Answer Assessment - Initial Assessment Questions 1. LOCATION: "Where does it hurt?"       Pain under her  L breast- patient turns certain ways and she has pain- she has been going to the gym 2. RADIATION: "Does the pain go anywhere else?" (e.g., into neck, jaw, arms, back)     Pain radiates around to sides to back- better if patient sits up and leans forward 3. ONSET: "When did the chest pain begin?" (Minutes, hours or days)      1 week- worse last couple nights 4. PATTERN "Does the pain come and go, or has it been constant since it started?"  "Does it get worse with exertion?"      Comes/ goes  5. DURATION: "How long does it last" (e.g., seconds, minutes, hours)     When she stretches she feels better 6. SEVERITY: "How bad is the pain?"  (e.g., Scale 1-10; mild, moderate, or severe)    - MILD (1-3): doesn't interfere with normal activities     - MODERATE (4-7): interferes with normal activities or awakens from sleep    - SEVERE (8-10): excruciating pain, unable to do any normal activities       severe 7. CARDIAC RISK FACTORS: "Do you have any history of heart problems or risk factors for heart disease?" (e.g., prior heart attack, angina; high blood pressure, diabetes, being overweight, high cholesterol, smoking, or strong family history of heart  disease)     2 stints 8. PULMONARY RISK FACTORS: "Do you have any history of lung disease?"  (e.g., blood clots in lung, asthma, emphysema, birth control pills)     COPD- no swelling 9. CAUSE: "What do you think is causing the chest pain?"     movement 10. OTHER SYMPTOMS: "Do you have any other symptoms?" (e.g., dizziness, nausea, vomiting, sweating, fever, difficulty breathing, cough)       Nausea, vomiting- last week, some nausea, feverish, SOB- tad cough 11. PREGNANCY: "Is there any chance you are pregnant?" "When was your last menstrual period?"       n/a  Protocols used: CHEST PAIN-A-AH

## 2017-08-25 NOTE — ED Triage Notes (Signed)
Patient ambulatory to triage with steady gait, without difficulty or distress noted; pt reports pain beneath left breast radiating around into back; had N/V 5/1 and 5/4 and feeling bad since; pain began 2 days ago esp when lying supine, leg cramping; st hx of same with bleeding ulcers

## 2017-08-25 NOTE — ED Provider Notes (Signed)
[facility] Emergency Department Provider Note  ____________________________________________   First MD Initiated Contact with Patient 08/25/17 2307     (approximate)  I have reviewed the triage vital signs and the nursing notes.   HISTORY  Chief Complaint Chest Pain   HPI Linda Mejia is a 62 y.o. female self presents to the emergency department with left upper quadrant abdominal pain for the past several days.  It wraps around from her anterior left upper quadrant under her breast towards her back.  It is not ripping or tearing.  It is moderate to severe.  It seems to be intermittent.  Somewhat improved when sitting up somewhat worse when lying down.  Is not particularly chest pain per se.  It is nonexertional.  No leg swelling.  No history of DVT or pulmonary embolism.  She does have a long-standing history of gastric ulcers and is not currently taking any medications.  She does have a history of coronary artery disease as well as intussusception which was operated on several years ago.  Past Medical History:  Diagnosis Date  . Abnormal ankle brachial index (ABI) 12/02/2016  . ADHD (attention deficit hyperactivity disorder)   . Anxiety   . Aortic atherosclerosis ([place]) 12/02/2016   July 2018  . Chronic back pain   . Coronary artery disease   . Herpes genitalis in women   . History of stomach ulcers    x7 this year. Bleeding  . Hypertension   . Prediabetes 12/02/2016    Patient Active Problem List   Diagnosis Date Noted  . Medication monitoring encounter 03/18/2017  . Colon cancer screening 03/18/2017  . Coronary artery calcification seen on CT scan 12/02/2016  . Aortic atherosclerosis ([place]) 12/02/2016  . Abnormal ankle brachial index (ABI) 12/02/2016  . Prediabetes 12/02/2016  . Fracture of rib 08/28/2016  . Chest pain 08/11/2016  . Hx of tobacco use, presenting hazards to health 06/09/2016  . Degenerative arthritis of knee, bilateral 12/27/2015    . Obesity 12/27/2015  . GI bleed 09/21/2015  . Reflux esophagitis   . Other diseases of stomach and duodenum   . Multiple joint pain 06/05/2015  . Night sweats 06/05/2015  . Headache 04/12/2015  . Subclinical hypothyroidism 03/26/2015  . Herpes simplex type 2 infection 03/21/2015  . Rhinitis, allergic 03/21/2015  . Cervical pain 09/21/2014  . ADHD (attention deficit hyperactivity disorder), inattentive type 09/20/2014  . Episodic paroxysmal anxiety disorder 09/20/2014  . History of gastric ulcer 09/20/2014  . Abuse, drug or alcohol ([place]) 09/20/2014  . Substance abuse ([place]) 09/20/2014  . HLD (hyperlipidemia) 10/03/2009  . Coronary artery disease involving native coronary artery of native heart with angina pectoris ([place]) 10/02/2009  . Hypertension goal BP (blood pressure) < 140/90 10/02/2009    Past Surgical History:  Procedure Laterality Date  . CARDIAC CATHETERIZATION    . CESAREAN SECTION    . ESOPHAGOGASTRODUODENOSCOPY (EGD) WITH PROPOFOL N/A 09/21/2015   Procedure: ESOPHAGOGASTRODUODENOSCOPY (EGD) WITH PROPOFOL;  Surgeon: Lucilla Lame, MD;  Location: [facility] ENDOSCOPY;  Service: Endoscopy;  Laterality: N/A;  . ESOPHAGOGASTRODUODENOSCOPY (EGD) WITH PROPOFOL N/A 07/30/2016   Procedure: ESOPHAGOGASTRODUODENOSCOPY (EGD) WITH PROPOFOL;  Surgeon: Jonathon Bellows, MD;  Location: [facility] ENDOSCOPY;  Service: Endoscopy;  Laterality: N/A;  . LEFT HEART CATH AND CORONARY ANGIOGRAPHY Right 08/12/2016   Procedure: Left Heart Cath and Coronary Angiography;  Surgeon: Dionisio David, MD;  Location: [place] [facility];  Service: Cardiovascular;  Laterality: Right;  . TONSILLECTOMY    .  TUBAL LIGATION      Prior to Admission medications   Medication Sig Start Date End Date Taking? Authorizing Provider  acyclovir (ZOVIRAX) 400 MG tablet take 1 tablet by mouth twice a day 08/29/16   Arnetha Courser, MD  amoxicillin-clavulanate (AUGMENTIN) 875-125 MG tablet Take 1 tablet by mouth 2 (two) times daily. 06/19/17    Lada, Satira Anis, MD  amphetamine-dextroamphetamine (ADDERALL) 30 MG tablet Take 1 tablet by mouth 2 (two) times daily. 12/02/16   Arnetha Courser, MD  aspirin EC 81 MG tablet Take 81 mg by mouth daily.    [provider]  benzonatate (TESSALON) 100 MG capsule Take 1 capsule (100 mg total) by mouth 2 (two) times daily as needed for cough. 06/19/17   Lada, Satira Anis, MD  diazepam (VALIUM) 10 MG tablet Take 1 tablet (10 mg total) by mouth every 8 (eight) hours as needed for anxiety. 09/22/15   Fritzi Mandes, MD  erythromycin ophthalmic ointment Place 1 application into the left eye 3 (three) times daily. 06/10/17   Hubbard Hartshorn, FNP  famotidine (PEPCID) 20 MG tablet Take 1 tablet (20 mg total) by mouth 2 (two) times daily. 08/26/17 08/26/18  Darel Hong, MD  fluticasone (FLONASE) 50 MCG/ACT nasal spray Place 2 sprays into both nostrils daily. Patient taking differently: Place 2 sprays into both nostrils as needed.  06/09/16   Arnetha Courser, MD  metoprolol tartrate (LOPRESSOR) 50 MG tablet Take 1 tablet (50 mg total) by mouth 2 (two) times daily. 06/10/17   Arnetha Courser, MD  pantoprazole (PROTONIX) 40 MG tablet Take 1 tablet (40 mg total) by mouth daily. 08/26/17 08/26/18  Darel Hong, MD  PRALUENT 75 MG/ML SOPN Inject 75 mLs as directed every 14 (fourteen) days. 11/25/16   [provider]    Allergies Patient has no known allergies.  Family History  Problem Relation Age of Onset  . Diabetes Mother   . CVA Mother   . Heart disease Father   . Heart disease Sister   . Heart disease Brother   . Heart disease Sister   . Heart disease Sister   . Heart disease Sister   . Heart disease Brother   . Heart disease Brother   . Heart disease Brother     Social History Social History   Tobacco Use  . Smoking status: Former Smoker    Packs/day: 1.00    Years: 30.00    Pack years: 30.00  . Smokeless tobacco: Never Used  Substance Use Topics  . Alcohol use: No    Frequency:  Never    Comment: occasionally  . Drug use: No    Review of Systems Constitutional: No fever/chills Eyes: No visual changes. ENT: No sore throat. Cardiovascular: Positive for chest pain. Respiratory: Denies shortness of breath. Gastrointestinal: Positive for abdominal pain.  Positive for nausea, positive for vomiting.  No diarrhea.  No constipation. Genitourinary: Negative for dysuria. Musculoskeletal: Negative for back pain. Skin: Negative for rash. Neurological: Negative for headaches, focal weakness or numbness.   ____________________________________________   PHYSICAL EXAM:  VITAL SIGNS: ED Triage Vitals [08/25/17 1913]  Enc Vitals Group     BP (!) 154/100     Pulse Rate (!) 110     Resp 20     Temp 98.2 F (36.8 C)     Temp Source Oral     SpO2 100 %     Weight 200 lb (90.7 kg)     Height 5\' 6"  (  1.676 m)     Head Circumference      Peak Flow      Pain Score 4     Pain Loc      Pain Edu?      Excl. in [place]?     Constitutional: Alert and oriented x4 appears somewhat uncomfortable nontoxic no diaphoresis speaks full clear sentences Eyes: PERRL EOMI. Head: Atraumatic. Nose: No congestion/rhinnorhea. Mouth/Throat: No trismus Neck: No stridor.   Cardiovascular: Normal rate, regular rhythm. Grossly normal heart sounds.  Good peripheral circulation. Respiratory: Normal respiratory effort.  No retractions. Lungs CTAB and moving good air Gastrointestinal: Soft nondistended mild upper abdominal tenderness no rebound or guarding no peritonitis Musculoskeletal: No lower extremity edema   Neurologic:  Normal speech and language. No gross focal neurologic deficits are appreciated. Skin:  Skin is warm, dry and intact. No rash noted. Pacifically no zoster rash Psychiatric: Mood and affect are normal. Speech and behavior are normal.    ____________________________________________   DIFFERENTIAL includes but not limited to  Gastritis, gastric ulcer, GI bleed,  pancreatitis, zoster, cholecystitis, acute coronary syndrome ____________________________________________   LABS (all labs ordered are listed, but only abnormal results are displayed)  Labs Reviewed  COMPREHENSIVE METABOLIC PANEL - Abnormal; Notable for the following components:      Result Value   Chloride 98 (*)    Glucose, Bld 110 (*)    Total Protein 8.2 (*)    All other components within normal limits  URINALYSIS, COMPLETE (UACMP) WITH MICROSCOPIC - Abnormal; Notable for the following components:   Color, Urine STRAW (*)    APPearance CLEAR (*)    All other components within normal limits  CBC  TROPONIN I  LIPASE, BLOOD    Lab work reviewed by me with no acute disease __________________________________________  EKG  ED ECG REPORT I, Darel Hong, the attending physician, personally viewed and interpreted this ECG.  Date: 08/25/2017 EKG Time:  Rate: 96 Rhythm: normal sinus rhythm QRS Axis: Leftward axis Intervals: normal ST/T Wave abnormalities: normal Narrative Interpretation: no evidence of acute ischemia  ____________________________________________  RADIOLOGY  CT abdomen pelvis reviewed by me with no acute disease ____________________________________________   PROCEDURES  Procedure(s) performed: no  Procedures  Critical Care performed: no  Observation: no ____________________________________________   INITIAL IMPRESSION / ASSESSMENT AND PLAN / ED COURSE  Pertinent labs & imaging results that were available during my care of the patient were reviewed by me and considered in my medical decision making (see chart for details).  The patient's pain appears to be more abdominal than chest.  Troponin negative.  She does have some tenderness and multiple previous abdominal surgeries.  CT scan is pending.  I do not believe that this is zoster.     ----------------------------------------- 1:15 AM on  08/26/2017 -----------------------------------------  The patient's pain is only minimally improved.  Abdominal exam is benign.  CT is reassuring.  The patient feels like this is a recurrence of her reflux and ulcer.  I had a lengthy discussion regarding the diagnostic uncertainty and the importance of following up with her primary care for further possible H. pylori testing and referral to gastroenterology.  She verbalizes understanding and agreement with the plan.  Discharged home in improved condition she verbalizes understanding and agreement the plan. ____________________________________________   FINAL CLINICAL IMPRESSION(S) / ED DIAGNOSES  Final diagnoses:  Left upper quadrant pain      NEW MEDICATIONS STARTED DURING THIS VISIT:  Discharge Medication List as of 08/26/2017  1:15  AM    START taking these medications   Details  famotidine (PEPCID) 20 MG tablet Take 1 tablet (20 mg total) by mouth 2 (two) times daily., Starting Wed 08/26/2017, Until Thu 08/26/2018, Print    pantoprazole (PROTONIX) 40 MG tablet Take 1 tablet (40 mg total) by mouth daily., Starting Wed 08/26/2017, Until Thu 08/26/2018, Print         Note:  This document was prepared using Dragon voice recognition software and may include unintentional dictation errors.     Darel Hong, MD 08/26/17 2235

## 2017-08-26 DIAGNOSIS — R112 Nausea with vomiting, unspecified: Secondary | ICD-10-CM | POA: Diagnosis not present

## 2017-08-26 DIAGNOSIS — R1110 Vomiting, unspecified: Secondary | ICD-10-CM | POA: Diagnosis not present

## 2017-08-26 MED ORDER — HYDROCODONE-ACETAMINOPHEN 5-325 MG PO TABS
1.0000 | ORAL_TABLET | Freq: Once | ORAL | Status: AC
  Administered 2017-08-26: 1 via ORAL
  Filled 2017-08-26: qty 1

## 2017-08-26 MED ORDER — PANTOPRAZOLE SODIUM 40 MG PO TBEC: 40.0000 mg | DELAYED_RELEASE_TABLET | Freq: Every day | ORAL | 0 refills | Status: DC

## 2017-08-26 MED ORDER — FAMOTIDINE 20 MG PO TABS: 20.0000 mg | ORAL_TABLET | Freq: Two times a day (BID) | ORAL | 0 refills | Status: DC

## 2017-08-26 MED ORDER — IOPAMIDOL (ISOVUE-370) INJECTION 76%
75.0000 mL | Freq: Once | INTRAVENOUS | Status: AC | PRN
  Administered 2017-08-26: 75 mL via INTRAVENOUS

## 2017-08-26 NOTE — ED Notes (Signed)
Pt discharged to home.  Discharge instructions reviewed.  Pt taking uber home.  Verbalized understanding.  No questions or concerns at this time.  Teach back verified.  Pt in NAD.  No items left in ED.

## 2017-08-26 NOTE — Discharge Instructions (Signed)
It was a pleasure to take care of you today, and thank you for coming to our emergency department.  If you have any questions or concerns before leaving please ask the nurse to grab me and I'm more than happy to go through your aftercare instructions again.  If you were prescribed any opioid pain medication today such as Norco, Vicodin, Percocet, morphine, hydrocodone, or oxycodone please make sure you do not drive when you are taking this medication as it can alter your ability to drive safely.  If you have any concerns once you are home that you are not improving or are in fact getting worse before you can make it to your follow-up appointment, please do not hesitate to call 911 and come back for further evaluation.  Darel Hong, MD  Results for orders placed or performed during the hospital encounter of 08/25/17  CBC  Result Value Ref Range   WBC 9.6 3.6 - 11.0 K/uL   RBC 4.21 3.80 - 5.20 MIL/uL   Hemoglobin 13.8 12.0 - 16.0 g/dL   HCT 40.1 35.0 - 47.0 %   MCV 95.2 80.0 - 100.0 fL   MCH 32.8 26.0 - 34.0 pg   MCHC 34.4 32.0 - 36.0 g/dL   RDW 13.6 11.5 - 14.5 %   Platelets 412 150 - 440 K/uL  Troponin I  Result Value Ref Range   Troponin I <0.03 <0.03 ng/mL  Comprehensive metabolic panel  Result Value Ref Range   Sodium 135 135 - 145 mmol/L   Potassium 4.6 3.5 - 5.1 mmol/L   Chloride 98 (L) 101 - 111 mmol/L   CO2 27 22 - 32 mmol/L   Glucose, Bld 110 (H) 65 - 99 mg/dL   BUN 19 6 - 20 mg/dL   Creatinine, Ser 0.88 0.44 - 1.00 mg/dL   Calcium 9.4 8.9 - 10.3 mg/dL   Total Protein 8.2 (H) 6.5 - 8.1 g/dL   Albumin 4.6 3.5 - 5.0 g/dL   AST 29 15 - 41 U/L   ALT 25 14 - 54 U/L   Alkaline Phosphatase 87 38 - 126 U/L   Total Bilirubin 0.4 0.3 - 1.2 mg/dL   GFR calc non Af Amer >60 >60 mL/min   GFR calc Af Amer >60 >60 mL/min   Anion gap 10 5 - 15  Urinalysis, Complete w Microscopic  Result Value Ref Range   Color, Urine STRAW (A) YELLOW   APPearance CLEAR (A) CLEAR   Specific  Gravity, Urine 1.006 1.005 - 1.030   pH 5.0 5.0 - 8.0   Glucose, UA NEGATIVE NEGATIVE mg/dL   Hgb urine dipstick NEGATIVE NEGATIVE   Bilirubin Urine NEGATIVE NEGATIVE   Ketones, ur NEGATIVE NEGATIVE mg/dL   Protein, ur NEGATIVE NEGATIVE mg/dL   Nitrite NEGATIVE NEGATIVE   Leukocytes, UA NEGATIVE NEGATIVE   RBC / HPF 0-5 0 - 5 RBC/hpf   WBC, UA 0-5 0 - 5 WBC/hpf   Bacteria, UA NONE SEEN NONE SEEN   Squamous Epithelial / LPF NONE SEEN 0 - 5  Lipase, blood  Result Value Ref Range   Lipase 32 11 - 51 U/L   Dg Chest 2 View  Result Date: 08/25/2017 CLINICAL DATA:  Chest pain EXAM: CHEST - 2 VIEW COMPARISON:  11/04/2016 FINDINGS: Mild scarring in the lung bases. Negative for heart failure or pneumonia. No effusion. Heart size within normal limits. IMPRESSION: No active cardiopulmonary disease. Electronically Signed   By: Franchot Gallo M.D.   On: 08/25/2017 19:36  Ct Abdomen Pelvis W Contrast  Result Date: 08/26/2017 CLINICAL DATA:  Acute onset of nausea and vomiting. Pain beneath the left breast, radiating to the back. EXAM: CT ABDOMEN AND PELVIS WITH CONTRAST TECHNIQUE: Multidetector CT imaging of the abdomen and pelvis was performed using the standard protocol following bolus administration of intravenous contrast. CONTRAST:  29mL ISOVUE-370 IOPAMIDOL (ISOVUE-370) INJECTION 76% COMPARISON:  Right upper quadrant ultrasound performed 08/03/2014, and CT of the abdomen and pelvis from 08/20/2010 FINDINGS: Lower chest: The visualized lung bases are clear. Scattered coronary artery calcifications are seen. Hepatobiliary: The liver is unremarkable in appearance. The gallbladder is unremarkable in appearance. The common bile duct remains normal in caliber. Pancreas: The pancreas is within normal limits. Spleen: The spleen is unremarkable in appearance. Adrenals/Urinary Tract: The adrenal glands are unremarkable in appearance. The kidneys are within normal limits. There is no evidence of hydronephrosis.  No renal or ureteral stones are identified. No perinephric stranding is seen. Stomach/Bowel: The stomach is unremarkable in appearance. The small bowel is within normal limits. The appendix is visualized and normal in caliber, without evidence of appendicitis. There is no evidence of prior appendectomy. The colon is unremarkable in appearance. Vascular/Lymphatic: Scattered calcification is seen along the abdominal aorta and its branches. The abdominal aorta is otherwise grossly unremarkable. The inferior vena cava is grossly unremarkable. No retroperitoneal lymphadenopathy is seen. No pelvic sidewall lymphadenopathy is identified. Reproductive: The bladder is mildly distended and within normal limits. The uterus is grossly unremarkable in appearance. The ovaries are relatively symmetric. No suspicious adnexal masses are seen. Other: No additional soft tissue abnormalities are seen. Musculoskeletal: No acute osseous abnormalities are identified. There is minimal chronic loss of height at vertebral body T12. The visualized musculature is unremarkable in appearance. IMPRESSION: 1. No acute abnormality seen within the abdomen or pelvis. 2. Scattered coronary artery calcifications seen. Aortic Atherosclerosis (ICD10-I70.0). Electronically Signed   By: Garald Balding M.D.   On: 08/26/2017 00:34

## 2017-08-28 DIAGNOSIS — I2510 Atherosclerotic heart disease of native coronary artery without angina pectoris: Secondary | ICD-10-CM | POA: Diagnosis not present

## 2017-08-28 DIAGNOSIS — E785 Hyperlipidemia, unspecified: Secondary | ICD-10-CM | POA: Diagnosis not present

## 2017-08-28 DIAGNOSIS — K259 Gastric ulcer, unspecified as acute or chronic, without hemorrhage or perforation: Secondary | ICD-10-CM | POA: Diagnosis not present

## 2017-08-28 DIAGNOSIS — I10 Essential (primary) hypertension: Secondary | ICD-10-CM | POA: Diagnosis not present

## 2017-09-03 DIAGNOSIS — R079 Chest pain, unspecified: Secondary | ICD-10-CM | POA: Diagnosis not present

## 2017-09-07 DIAGNOSIS — I2510 Atherosclerotic heart disease of native coronary artery without angina pectoris: Secondary | ICD-10-CM | POA: Diagnosis not present

## 2017-09-07 DIAGNOSIS — I351 Nonrheumatic aortic (valve) insufficiency: Secondary | ICD-10-CM | POA: Diagnosis not present

## 2017-09-07 DIAGNOSIS — R079 Chest pain, unspecified: Secondary | ICD-10-CM | POA: Diagnosis not present

## 2017-09-07 DIAGNOSIS — I340 Nonrheumatic mitral (valve) insufficiency: Secondary | ICD-10-CM | POA: Diagnosis not present

## 2017-09-15 ENCOUNTER — Ambulatory Visit: Payer: Medicare Other

## 2017-09-15 ENCOUNTER — Encounter: Payer: Medicare Other | Admitting: Family Medicine

## 2017-09-16 DIAGNOSIS — R072 Precordial pain: Secondary | ICD-10-CM | POA: Diagnosis not present

## 2017-09-16 DIAGNOSIS — R079 Chest pain, unspecified: Secondary | ICD-10-CM | POA: Diagnosis not present

## 2017-09-17 ENCOUNTER — Ambulatory Visit: Payer: Medicare Other | Admitting: Family Medicine

## 2017-09-18 DIAGNOSIS — I10 Essential (primary) hypertension: Secondary | ICD-10-CM | POA: Diagnosis not present

## 2017-09-18 DIAGNOSIS — I2510 Atherosclerotic heart disease of native coronary artery without angina pectoris: Secondary | ICD-10-CM | POA: Diagnosis not present

## 2017-09-18 DIAGNOSIS — R079 Chest pain, unspecified: Secondary | ICD-10-CM | POA: Diagnosis not present

## 2017-09-18 DIAGNOSIS — Z9861 Coronary angioplasty status: Secondary | ICD-10-CM | POA: Diagnosis not present

## 2017-09-18 DIAGNOSIS — R0602 Shortness of breath: Secondary | ICD-10-CM | POA: Diagnosis not present

## 2017-09-22 ENCOUNTER — Other Ambulatory Visit: Payer: Self-pay

## 2017-09-22 MED ORDER — FLUTICASONE PROPIONATE 50 MCG/ACT NA SUSP: 2.0000 | NASAL | 11 refills | Status: DC | PRN

## 2017-10-05 ENCOUNTER — Ambulatory Visit: Payer: Medicare Other | Admitting: Gastroenterology

## 2017-10-14 ENCOUNTER — Ambulatory Visit: Payer: Self-pay | Admitting: Podiatry

## 2017-11-10 ENCOUNTER — Ambulatory Visit: Payer: Medicare Other

## 2017-11-13 ENCOUNTER — Other Ambulatory Visit: Payer: Self-pay | Admitting: Family Medicine

## 2017-11-17 ENCOUNTER — Ambulatory Visit: Payer: Medicare Other | Admitting: Gastroenterology

## 2017-11-17 NOTE — Progress Notes (Deleted)
She presented to the ER on 08/25/17 with abdominal pain in the left upper part which began a few days prior. Troponins checked were negative, she has a history of multiple abdominal surgeries. CT scan of the abdomen was negative for any acute changes. She was treated for reflux  And discharged on PPI.   In 07/2016 she was admitted to the hospital with coffee ground emesis and melena. She had been on Ibuprofen . EGD showed small gastric ulcers. Did not follow up after  Hb on admission was 13.8 grams. EGD in 12/2015 showed grade A esophagitis, gastritis and a small hiatal hernia.      CBC Latest Ref Rng & Units 08/25/2017 11/04/2016 08/12/2016  WBC 3.6 - 11.0 K/uL 9.6 6.2 7.0  Hemoglobin 12.0 - 16.0 g/dL 13.8 13.3 12.7  Hematocrit 35.0 - 47.0 % 40.1 40.0 38.2  Platelets 150 - 440 K/uL 412 377 346  ] Hepatic Function Latest Ref Rng & Units 08/25/2017 03/18/2017 07/29/2016  Total Protein 6.5 - 8.1 g/dL 8.2(H) 6.8 7.5  Albumin 3.5 - 5.0 g/dL 4.6 - 3.9  AST 15 - 41 U/L '29 29 23  ' ALT 14 - 54 U/L '25 19 17  ' Alk Phosphatase 38 - 126 U/L 87 - 58  Total Bilirubin 0.3 - 1.2 mg/dL 0.4 0.4 0.5  Bilirubin, Direct 0.0 - 0.3 mg/dL - - -   BMP Latest Ref Rng & Units 08/25/2017 03/18/2017 11/04/2016  Glucose 65 - 99 mg/dL 110(H) 96 107(H)  BUN 6 - 20 mg/dL '19 13 12  ' Creatinine 0.44 - 1.00 mg/dL 0.88 0.91 0.75  BUN/Creat Ratio 6 - 22 (calc) - NOT APPLICABLE -  Sodium 903 - 145 mmol/L 135 138 136  Potassium 3.5 - 5.1 mmol/L 4.6 4.2 3.9  Chloride 101 - 111 mmol/L 98(L) 100 101  CO2 22 - 32 mmol/L '27 31 25  ' Calcium 8.9 - 10.3 mg/dL 9.4 9.0 8.8(L)     Plan  1. Stool H pylori antigen 2. PPI 3. No NSAID's

## 2017-11-22 ENCOUNTER — Other Ambulatory Visit: Payer: Self-pay

## 2017-11-22 ENCOUNTER — Emergency Department
Admission: EM | Admit: 2017-11-22 | Discharge: 2017-11-22 | Disposition: A | Discharge status: Home / Self Care | Payer: Medicare Other | Attending: Emergency Medicine | Admitting: Emergency Medicine

## 2017-11-22 ENCOUNTER — Encounter: Payer: Self-pay | Admitting: Emergency Medicine

## 2017-11-22 DIAGNOSIS — I25119 Atherosclerotic heart disease of native coronary artery with unspecified angina pectoris: Secondary | ICD-10-CM | POA: Insufficient documentation

## 2017-11-22 DIAGNOSIS — Z87891 Personal history of nicotine dependence: Secondary | ICD-10-CM | POA: Diagnosis not present

## 2017-11-22 DIAGNOSIS — E039 Hypothyroidism, unspecified: Secondary | ICD-10-CM | POA: Insufficient documentation

## 2017-11-22 DIAGNOSIS — M545 Low back pain: Secondary | ICD-10-CM

## 2017-11-22 DIAGNOSIS — F909 Attention-deficit hyperactivity disorder, unspecified type: Secondary | ICD-10-CM | POA: Diagnosis not present

## 2017-11-22 DIAGNOSIS — I10 Essential (primary) hypertension: Secondary | ICD-10-CM | POA: Diagnosis not present

## 2017-11-22 DIAGNOSIS — Z79899 Other long term (current) drug therapy: Secondary | ICD-10-CM | POA: Insufficient documentation

## 2017-11-22 LAB — CBC WITH DIFFERENTIAL/PLATELET
Basophils Absolute: 0.1 10*3/uL (ref 0–0.1)
Basophils Relative: 1 %
Eosinophils Absolute: 0.2 10*3/uL (ref 0–0.7)
Eosinophils Relative: 2 %
HCT: 37 % (ref 35.0–47.0)
Hemoglobin: 13.1 g/dL (ref 12.0–16.0)
Lymphocytes Relative: 34 %
Lymphs Abs: 3.3 10*3/uL (ref 1.0–3.6)
MCH: 33.1 pg (ref 26.0–34.0)
MCHC: 35.3 g/dL (ref 32.0–36.0)
MCV: 93.7 fL (ref 80.0–100.0)
Monocytes Absolute: 1.2 10*3/uL — ABNORMAL HIGH (ref 0.2–0.9)
Monocytes Relative: 13 %
Neutro Abs: 4.8 10*3/uL (ref 1.4–6.5)
Neutrophils Relative %: 50 %
Platelets: 284 10*3/uL (ref 150–440)
RBC: 3.95 MIL/uL (ref 3.80–5.20)
RDW: 13.4 % (ref 11.5–14.5)
WBC: 9.6 10*3/uL (ref 3.6–11.0)

## 2017-11-22 LAB — URINALYSIS, COMPLETE (UACMP) WITH MICROSCOPIC
Bacteria, UA: NONE SEEN
Bilirubin Urine: NEGATIVE
Glucose, UA: NEGATIVE mg/dL
Hgb urine dipstick: NEGATIVE
Ketones, ur: 5 mg/dL — AB
Leukocytes, UA: NEGATIVE
Nitrite: NEGATIVE
Protein, ur: NEGATIVE mg/dL
Specific Gravity, Urine: 1.019 (ref 1.005–1.030)
pH: 5 (ref 5.0–8.0)

## 2017-11-22 LAB — ALKALINE PHOSPHATASE: Alkaline Phosphatase: 77 U/L (ref 38–126)

## 2017-11-22 LAB — BASIC METABOLIC PANEL
Anion gap: 13 (ref 5–15)
BUN: 30 mg/dL — ABNORMAL HIGH (ref 8–23)
CO2: 25 mmol/L (ref 22–32)
Calcium: 8.5 mg/dL — ABNORMAL LOW (ref 8.9–10.3)
Chloride: 101 mmol/L (ref 98–111)
Creatinine, Ser: 1.01 mg/dL — ABNORMAL HIGH (ref 0.44–1.00)
GFR calc Af Amer: >60 mL/min (ref >60)
GFR calc non Af Amer: 59 mL/min — ABNORMAL LOW (ref >60)
Glucose, Bld: 97 mg/dL (ref 70–99)
Potassium: 4.4 mmol/L (ref 3.5–5.1)
Sodium: 139 mmol/L (ref 135–145)

## 2017-11-22 LAB — BILIRUBIN, TOTAL: Total Bilirubin: 0.6 mg/dL (ref 0.3–1.2)

## 2017-11-22 LAB — CK: Total CK: 137 U/L (ref 38–234)

## 2017-11-22 LAB — LIPASE, BLOOD: Lipase: 31 U/L (ref 11–51)

## 2017-11-22 LAB — ALT: ALT: 10 U/L (ref 0–44)

## 2017-11-22 LAB — ALBUMIN: Albumin: 4.3 g/dL (ref 3.5–5.0)

## 2017-11-22 LAB — PROTEIN, TOTAL: Total Protein: 7.6 g/dL (ref 6.5–8.1)

## 2017-11-22 LAB — AST: AST: 21 U/L (ref 15–41)

## 2017-11-22 MED ORDER — KETOROLAC TROMETHAMINE 30 MG/ML IJ SOLN
15.0000 mg | Freq: Once | INTRAMUSCULAR | Status: AC
  Administered 2017-11-22: 15 mg via INTRAVENOUS

## 2017-11-22 MED ORDER — TRAMADOL HCL 50 MG PO TABS: 50.0000 mg | ORAL_TABLET | Freq: Four times a day (QID) | ORAL | 0 refills | Status: DC | PRN

## 2017-11-22 MED ORDER — SODIUM CHLORIDE 0.9 % IV SOLN
1000.0000 mL | Freq: Once | INTRAVENOUS | Status: AC
  Administered 2017-11-22: 1000 mL via INTRAVENOUS

## 2017-11-22 NOTE — ED Provider Notes (Signed)
[facility] Emergency Department Provider Note   ____________________________________________    I have reviewed the triage vital signs and the nursing notes.   HISTORY  Chief Complaint Flank Pain and Abdominal Pain     HPI Linda Mejia is a 62 y.o. female who presents with multiple complaints.  Patient reports over the last month she has had cramping pains in both of her legs primarily in her thighs both anterior and posterior.  She has recently started a new cholesterol injection.  She presents today because she has developed a sharp pain in her low back on the left causes pain "in her tailbone ".  No chest pain or difficulty breathing.  No fevers or chills.  No difficulty breathing.  No pleurisy.  No rash.  Past Medical History:  Diagnosis Date  . Abnormal ankle brachial index (ABI) 12/02/2016  . ADHD (attention deficit hyperactivity disorder)   . Anxiety   . Aortic atherosclerosis ([place]) 12/02/2016   July 2018  . Chronic back pain   . Coronary artery disease   . Herpes genitalis in women   . History of stomach ulcers    x7 this year. Bleeding  . Hypertension   . Prediabetes 12/02/2016    Patient Active Problem List   Diagnosis Date Noted  . Medication monitoring encounter 03/18/2017  . Colon cancer screening 03/18/2017  . Coronary artery calcification seen on CT scan 12/02/2016  . Aortic atherosclerosis ([place]) 12/02/2016  . Abnormal ankle brachial index (ABI) 12/02/2016  . Prediabetes 12/02/2016  . Fracture of rib 08/28/2016  . Chest pain 08/11/2016  . Hx of tobacco use, presenting hazards to health 06/09/2016  . Degenerative arthritis of knee, bilateral 12/27/2015  . Obesity 12/27/2015  . GI bleed 09/21/2015  . Reflux esophagitis   . Other diseases of stomach and duodenum   . Multiple joint pain 06/05/2015  . Night sweats 06/05/2015  . Headache 04/12/2015  . Subclinical hypothyroidism 03/26/2015  . Herpes simplex type 2 infection  03/21/2015  . Rhinitis, allergic 03/21/2015  . Cervical pain 09/21/2014  . ADHD (attention deficit hyperactivity disorder), inattentive type 09/20/2014  . Episodic paroxysmal anxiety disorder 09/20/2014  . History of gastric ulcer 09/20/2014  . Abuse, drug or alcohol () 09/20/2014  . Substance abuse ([place]) 09/20/2014  . HLD (hyperlipidemia) 10/03/2009  . Coronary artery disease involving native coronary artery of native heart with angina pectoris ([place]) 10/02/2009  . Hypertension goal BP (blood pressure) < 140/90 10/02/2009    Past Surgical History:  Procedure Laterality Date  . CARDIAC CATHETERIZATION    . CESAREAN SECTION    . ESOPHAGOGASTRODUODENOSCOPY (EGD) WITH PROPOFOL N/A 09/21/2015   Procedure: ESOPHAGOGASTRODUODENOSCOPY (EGD) WITH PROPOFOL;  Surgeon: Lucilla Lame, MD;  Location: [facility] ENDOSCOPY;  Service: Endoscopy;  Laterality: N/A;  . ESOPHAGOGASTRODUODENOSCOPY (EGD) WITH PROPOFOL N/A 07/30/2016   Procedure: ESOPHAGOGASTRODUODENOSCOPY (EGD) WITH PROPOFOL;  Surgeon: Jonathon Bellows, MD;  Location: [facility] ENDOSCOPY;  Service: Endoscopy;  Laterality: N/A;  . LEFT HEART CATH AND CORONARY ANGIOGRAPHY Right 08/12/2016   Procedure: Left Heart Cath and Coronary Angiography;  Surgeon: Dionisio David, MD;  Location: [place] [facility];  Service: Cardiovascular;  Laterality: Right;  . TONSILLECTOMY    . TUBAL LIGATION      Prior to Admission medications   Medication Sig Start Date End Date Taking? Authorizing Provider  acyclovir (ZOVIRAX) 400 MG tablet take 1 tablet by mouth twice a day 11/13/17   Arnetha Courser, MD  amoxicillin-clavulanate (AUGMENTIN) [id number]  MG tablet Take 1 tablet by mouth 2 (two) times daily. 06/19/17   Lada, Satira Anis, MD  amphetamine-dextroamphetamine (ADDERALL) 30 MG tablet Take 1 tablet by mouth 2 (two) times daily. 12/02/16   Arnetha Courser, MD  aspirin EC 81 MG tablet Take 81 mg by mouth daily.    [provider]  benzonatate (TESSALON) 100 MG capsule Take 1  capsule (100 mg total) by mouth 2 (two) times daily as needed for cough. 06/19/17   Lada, Satira Anis, MD  diazepam (VALIUM) 10 MG tablet Take 1 tablet (10 mg total) by mouth every 8 (eight) hours as needed for anxiety. 09/22/15   Fritzi Mandes, MD  erythromycin ophthalmic ointment Place 1 application into the left eye 3 (three) times daily. 06/10/17   Hubbard Hartshorn, FNP  famotidine (PEPCID) 20 MG tablet Take 1 tablet (20 mg total) by mouth 2 (two) times daily. 08/26/17 08/26/18  Darel Hong, MD  fluticasone (FLONASE) 50 MCG/ACT nasal spray Place 2 sprays into both nostrils as needed. 09/22/17   Arnetha Courser, MD  metoprolol tartrate (LOPRESSOR) 50 MG tablet Take 1 tablet (50 mg total) by mouth 2 (two) times daily. 06/10/17   Arnetha Courser, MD  omeprazole (PRILOSEC) 20 MG capsule Take by mouth.    [provider]  pantoprazole (PROTONIX) 40 MG tablet Take 1 tablet (40 mg total) by mouth daily. 08/26/17 08/26/18  Darel Hong, MD  PRALUENT 75 MG/ML SOPN Inject 75 mLs as directed every 14 (fourteen) days. 11/25/16   [provider]  pravastatin (PRAVACHOL) 40 MG tablet Take by mouth. 09/20/14   [provider]  ranolazine (RANEXA) 500 MG 12 hr tablet  09/21/17   [provider]  traMADol (ULTRAM) 50 MG tablet Take 1 tablet (50 mg total) by mouth every 6 (six) hours as needed. 11/22/17 11/22/18  Lavonia Drafts, MD  zolpidem (AMBIEN) 10 MG tablet Take by mouth.    [provider]     Allergies Patient has no known allergies.  Family History  Problem Relation Age of Onset  . Diabetes Mother   . CVA Mother   . Heart disease Father   . Heart disease Sister   . Heart disease Brother   . Heart disease Sister   . Heart disease Sister   . Heart disease Sister   . Heart disease Brother   . Heart disease Brother   . Heart disease Brother     Social History Social History   Tobacco Use  . Smoking status: Former Smoker    Packs/day: 1.00    Years: 30.00     Pack years: 30.00  . Smokeless tobacco: Never Used  Substance Use Topics  . Alcohol use: No    Frequency: Never    Comment: occasionally  . Drug use: No    Review of Systems  Constitutional: No fever/chills Eyes: No visual changes.  ENT: No neck pain Cardiovascular: Denies chest pain. Respiratory: Denies shortness of breath. Gastrointestinal:  No nausea, no vomiting.   Genitourinary: Negative for dysuria. Musculoskeletal: As above Skin: Negative for rash. Neurological: Negative for headaches   ____________________________________________   PHYSICAL EXAM:  VITAL SIGNS: ED Triage Vitals  Enc Vitals Group     BP 11/22/17 0726 (!) 147/92     Pulse Rate 11/22/17 0726 77     Resp 11/22/17 0726 20     Temp 11/22/17 0726 98.4 F (36.9 C)     Temp Source 11/22/17 0726 Oral  SpO2 11/22/17 0726 99 %     Weight 11/22/17 0723 95.7 kg (211 lb)     Height 11/22/17 0723 1.651 m (5\' 5" )     Head Circumference --      Peak Flow --      Pain Score 11/22/17 0722 8     Pain Loc --      Pain Edu? --      Excl. in [place]? --     Constitutional: Alert and oriented. No acute distress. Pleasant and interactive Eyes: Conjunctivae are normal.   Nose: No congestion/rhinnorhea. Mouth/Throat: Mucous membranes are moist.    Cardiovascular: Normal rate, regular rhythm. Grossly normal heart sounds.  Good peripheral circulation. Respiratory: Normal respiratory effort.  No retractions.  Gastrointestinal: Soft and nontender. No distention.  Musculoskeletal:   Back: No vertebral tenderness palpation, mild tenderness to palpation left lumbar paraspinal area.  Lower extremities warm and well perfused, 2+ DP pulses bilaterally.  Neurologic:  Normal speech and language. No gross focal neurologic deficits are appreciated.  Skin:  Skin is warm, dry and intact. No rash noted. Psychiatric: Mood and affect are normal. Speech and behavior are normal.  ____________________________________________     LABS (all labs ordered are listed, but only abnormal results are displayed)  Labs Reviewed  CBC WITH DIFFERENTIAL/PLATELET - Abnormal; Notable for the following components:      Result Value   Monocytes Absolute 1.2 (*)    All other components within normal limits  BASIC METABOLIC PANEL - Abnormal; Notable for the following components:   BUN 30 (*)    Creatinine, Ser 1.01 (*)    Calcium 8.5 (*)    GFR calc non Af Amer 59 (*)    All other components within normal limits  URINALYSIS, COMPLETE (UACMP) WITH MICROSCOPIC - Abnormal; Notable for the following components:   Color, Urine YELLOW (*)    APPearance CLEAR (*)    Ketones, ur 5 (*)    All other components within normal limits  CK  LIPASE, BLOOD  ALBUMIN  ALKALINE PHOSPHATASE  ALT  AST  BILIRUBIN, TOTAL  PROTEIN, TOTAL   ____________________________________________  EKG  None ____________________________________________  RADIOLOGY  None ____________________________________________   PROCEDURES  Procedure(s) performed: No  Procedures   Critical Care performed: No ____________________________________________   INITIAL IMPRESSION / ASSESSMENT AND PLAN / ED COURSE  Pertinent labs & imaging results that were available during my care of the patient were reviewed by me and considered in my medical decision making (see chart for details).  Patient presents with primary complaint of back pain which apparently began this morning.  Nurses note mentions abdominal pain however this appears to be primarily low back pain.  Cramping pain in her legs over the last month which worsens with exertion however no evidence of lower extreme ischemia.  Will check labs including CK given recent new cholesterol medication.  Review of records demonstrates the patient's symptoms appear to be somewhat chronic and have been ongoing for many months  CK is normal, lab work is unremarkable  Given chronicity of symptoms feel outpatient  work-up is appropriate given reassuring work-up here in the emergency department.  Discussed with the patient she agrees    ____________________________________________   FINAL CLINICAL IMPRESSION(S) / ED DIAGNOSES  Final diagnoses:  Bilateral low back pain, unspecified chronicity, with sciatica presence unspecified        Note:  This document was prepared using Dragon voice recognition software and may include unintentional dictation errors.  Lavonia Drafts, MD 11/22/17 1109

## 2017-11-22 NOTE — ED Triage Notes (Signed)
Presents with lower back pain and pain into both legs  Pain is mainly to left flank area  Describes pain as labor pain  Denies any n/v/d or urinary sx's

## 2017-11-26 ENCOUNTER — Ambulatory Visit: Payer: Medicare Other | Admitting: Family Medicine

## 2017-12-11 ENCOUNTER — Other Ambulatory Visit: Payer: Self-pay

## 2017-12-11 ENCOUNTER — Emergency Department
Admission: EM | Admit: 2017-12-11 | Discharge: 2017-12-11 | Disposition: A | Discharge status: Home / Self Care | Payer: Medicare Other | Attending: Emergency Medicine | Admitting: Emergency Medicine

## 2017-12-11 ENCOUNTER — Ambulatory Visit (INDEPENDENT_AMBULATORY_CARE_PROVIDER_SITE_OTHER): Payer: Medicare Other | Admitting: Family Medicine

## 2017-12-11 ENCOUNTER — Encounter: Payer: Self-pay | Admitting: Emergency Medicine

## 2017-12-11 ENCOUNTER — Encounter: Payer: Self-pay | Admitting: Family Medicine

## 2017-12-11 VITALS — BP 130/80 | HR 73 | Temp 98.1°F | Resp 16 | Ht 65.0 in | Wt 205.1 lb

## 2017-12-11 DIAGNOSIS — E785 Hyperlipidemia, unspecified: Secondary | ICD-10-CM | POA: Insufficient documentation

## 2017-12-11 DIAGNOSIS — R42 Dizziness and giddiness: Secondary | ICD-10-CM | POA: Diagnosis not present

## 2017-12-11 DIAGNOSIS — K921 Melena: Secondary | ICD-10-CM

## 2017-12-11 DIAGNOSIS — K529 Noninfective gastroenteritis and colitis, unspecified: Secondary | ICD-10-CM

## 2017-12-11 DIAGNOSIS — I10 Essential (primary) hypertension: Secondary | ICD-10-CM | POA: Diagnosis not present

## 2017-12-11 DIAGNOSIS — R197 Diarrhea, unspecified: Secondary | ICD-10-CM | POA: Insufficient documentation

## 2017-12-11 DIAGNOSIS — R5383 Other fatigue: Secondary | ICD-10-CM

## 2017-12-11 DIAGNOSIS — Z87891 Personal history of nicotine dependence: Secondary | ICD-10-CM | POA: Insufficient documentation

## 2017-12-11 DIAGNOSIS — Z79899 Other long term (current) drug therapy: Secondary | ICD-10-CM | POA: Insufficient documentation

## 2017-12-11 DIAGNOSIS — I2510 Atherosclerotic heart disease of native coronary artery without angina pectoris: Secondary | ICD-10-CM | POA: Diagnosis not present

## 2017-12-11 LAB — CBC
HCT: 42.2 % (ref 35.0–47.0)
Hemoglobin: 14.5 g/dL (ref 12.0–16.0)
MCH: 32.3 pg (ref 26.0–34.0)
MCHC: 34.3 g/dL (ref 32.0–36.0)
MCV: 94.1 fL (ref 80.0–100.0)
Platelets: 397 10*3/uL (ref 150–440)
RBC: 4.48 MIL/uL (ref 3.80–5.20)
RDW: 13.9 % (ref 11.5–14.5)
WBC: 7.2 10*3/uL (ref 3.6–11.0)

## 2017-12-11 LAB — COMPREHENSIVE METABOLIC PANEL
ALT: 14 U/L (ref 0–44)
AST: 25 U/L (ref 15–41)
Albumin: 4.5 g/dL (ref 3.5–5.0)
Alkaline Phosphatase: 69 U/L (ref 38–126)
Anion gap: 10 (ref 5–15)
BUN: 14 mg/dL (ref 8–23)
CO2: 26 mmol/L (ref 22–32)
Calcium: 9 mg/dL (ref 8.9–10.3)
Chloride: 101 mmol/L (ref 98–111)
Creatinine, Ser: 0.77 mg/dL (ref 0.44–1.00)
GFR calc Af Amer: >60 mL/min (ref >60)
GFR calc non Af Amer: >60 mL/min (ref >60)
Glucose, Bld: 96 mg/dL (ref 70–99)
Potassium: 4 mmol/L (ref 3.5–5.1)
Sodium: 137 mmol/L (ref 135–145)
Total Bilirubin: 0.6 mg/dL (ref 0.3–1.2)
Total Protein: 8.2 g/dL — ABNORMAL HIGH (ref 6.5–8.1)

## 2017-12-11 LAB — HEMOCCULT GUIAC POC 1CARD (OFFICE): Fecal Occult Blood, POC: NEGATIVE

## 2017-12-11 LAB — TYPE AND SCREEN
ABO/RH(D): B POS
Antibody Screen: NEGATIVE

## 2017-12-11 NOTE — ED Triage Notes (Signed)
Pt c/o diarrhea X 1 month and now having black stools last couple days.  No blood thinners.  Ambulatory with steady gait. Hx bleeding ulcer. VSS.  Pain to left flank area.

## 2017-12-11 NOTE — Progress Notes (Signed)
Name: Linda Mejia   MRN: [id number]    DOB: 11-09-1955   Date:12/11/2017       Progress Note  Subjective  Chief Complaint  Chief Complaint  Patient presents with  . Diarrhea    black tarry watery stools for 2 days. Was on and off for 1 month    HPI  Pt presents with concern for black and tarry stools x2 days, diarrhea for over a month.  She has a history of "7 bleeding ulcers" in the past.  She notes increased fatigue lately.  No recent abx use, she has not seen any bright red blood in her stool.  She does report some dry cough over the last few days.  Did cologuard - negative results on 06/24/2017.   Tried to call Dr. Georgeann Oppenheim office but could not get an appointment.  She brings with her two photos of her bowel movements from today and last night - both appear very dark/black and tarry, and she notes both BM's were a result of stool incontinence.  Patient Active Problem List   Diagnosis Date Noted  . Medication monitoring encounter 03/18/2017  . Colon cancer screening 03/18/2017  . Coronary artery calcification seen on CT scan 12/02/2016  . Aortic atherosclerosis ([place]) 12/02/2016  . Abnormal ankle brachial index (ABI) 12/02/2016  . Prediabetes 12/02/2016  . Fracture of rib 08/28/2016  . Chest pain 08/11/2016  . Hx of tobacco use, presenting hazards to health 06/09/2016  . Degenerative arthritis of knee, bilateral 12/27/2015  . Obesity 12/27/2015  . GI bleed 09/21/2015  . Reflux esophagitis   . Other diseases of stomach and duodenum   . Multiple joint pain 06/05/2015  . Night sweats 06/05/2015  . Headache 04/12/2015  . Subclinical hypothyroidism 03/26/2015  . Herpes simplex type 2 infection 03/21/2015  . Rhinitis, allergic 03/21/2015  . Cervical pain 09/21/2014  . ADHD (attention deficit hyperactivity disorder), inattentive type 09/20/2014  . Episodic paroxysmal anxiety disorder 09/20/2014  . History of gastric ulcer 09/20/2014  . Abuse, drug or alcohol ([place]) 09/20/2014  .  Substance abuse ([place]) 09/20/2014  . HLD (hyperlipidemia) 10/03/2009  . Coronary artery disease involving native coronary artery of native heart with angina pectoris ([place]) 10/02/2009  . Hypertension goal BP (blood pressure) < 140/90 10/02/2009    Social History   Tobacco Use  . Smoking status: Former Smoker    Packs/day: 1.00    Years: 30.00    Pack years: 30.00  . Smokeless tobacco: Never Used  Substance Use Topics  . Alcohol use: No    Frequency: Never    Comment: occasionally    Current Outpatient Medications:  .  acyclovir (ZOVIRAX) 400 MG tablet, take 1 tablet by mouth twice a day, Disp: 60 tablet, Rfl: 5 .  amphetamine-dextroamphetamine (ADDERALL) 30 MG tablet, Take 1 tablet by mouth 2 (two) times daily., Disp: , Rfl:  .  aspirin EC 81 MG tablet, Take 81 mg by mouth daily., Disp: , Rfl:  .  benzonatate (TESSALON) 100 MG capsule, Take 1 capsule (100 mg total) by mouth 2 (two) times daily as needed for cough., Disp: 20 capsule, Rfl: 0 .  diazepam (VALIUM) 10 MG tablet, Take 1 tablet (10 mg total) by mouth every 8 (eight) hours as needed for anxiety., Disp: 30 tablet, Rfl: 0 .  erythromycin ophthalmic ointment, Place 1 application into the left eye 3 (three) times daily., Disp: 3.5 g, Rfl: 0 .  famotidine (PEPCID) 20 MG tablet, Take 1 tablet (20  mg total) by mouth 2 (two) times daily., Disp: 60 tablet, Rfl: 0 .  fluticasone (FLONASE) 50 MCG/ACT nasal spray, Place 2 sprays into both nostrils as needed., Disp: 16 g, Rfl: 11 .  metoprolol tartrate (LOPRESSOR) 50 MG tablet, Take 1 tablet (50 mg total) by mouth 2 (two) times daily., Disp: 60 tablet, Rfl: 5 .  omeprazole (PRILOSEC) 20 MG capsule, Take by mouth., Disp: , Rfl:  .  pantoprazole (PROTONIX) 40 MG tablet, Take 1 tablet (40 mg total) by mouth daily., Disp: 30 tablet, Rfl: 0 .  PRALUENT 75 MG/ML SOPN, Inject 75 mLs as directed every 14 (fourteen) days., Disp: , Rfl:  .  pravastatin (PRAVACHOL) 40 MG tablet, Take by mouth., Disp:  , Rfl:  .  ranolazine (RANEXA) 500 MG 12 hr tablet, , Disp: , Rfl: 0 .  traMADol (ULTRAM) 50 MG tablet, Take 1 tablet (50 mg total) by mouth every 6 (six) hours as needed., Disp: 20 tablet, Rfl: 0 .  zolpidem (AMBIEN) 10 MG tablet, Take by mouth., Disp: , Rfl:  .  amoxicillin-clavulanate (AUGMENTIN) 875-125 MG tablet, Take 1 tablet by mouth 2 (two) times daily. (Patient not taking: Reported on 12/11/2017), Disp: 20 tablet, Rfl: 0  No Known Allergies  ROS  Constitutional: Negative for fever or weight change.  Respiratory: Positive for cough and shortness of breath.   Cardiovascular: Negative for chest pain; positive for palpitations.  Gastrointestinal: Positive for LUQ abdominal pain and bowel changes.  Musculoskeletal: Negative for gait problem or joint swelling.  Skin: Negative for rash.  Neurological: Lightheadedness with position changes. No other specific complaints in a complete review of systems (except as listed in HPI above).  Objective  Vitals:   12/11/17 1128  BP: 130/80  Pulse: 73  Resp: 16  Temp: 98.1 F (36.7 C)  TempSrc: Oral  SpO2: 97%  Weight: 205 lb 1.6 oz (93 kg)  Height: 5\' 5"  (1.651 m)   Body mass index is 34.13 kg/m.  Nursing Note and Vital Signs reviewed.  Physical Exam  Constitutional: Patient appears well-developed and well-nourished. No distress.  HENT: Head: Normocephalic and atraumatic.  Neck: Normal range of motion. Neck supple. No JVD present. No thyromegaly present.  Cardiovascular: Normal rate, regular rhythm and normal heart sounds.  No murmur heard. No BLE edema. Pulmonary/Chest: Effort normal and breath sounds normal. No respiratory distress. Abdominal: Soft. Bowel sounds are normal, no distension. There is generalized tenderness. no masses Musculoskeletal: Normal range of motion, no joint effusions. No gross deformities Neurological: he is alert and oriented to person, place, and time. No cranial nerve deficit. Coordination, balance,  strength, speech and gait are normal.  Skin: Skin is warm and dry. No rash noted. No erythema. Ms. Szymanowski does appear more pale than her typical baseline. Psychiatric: Patient has a normal mood and affect. behavior is normal. Judgment and thought content normal.  Results for orders placed or performed in visit on 12/11/17 (from the past 72 hour(s))  POCT Occult Blood Stool     Status: Normal   Collection Time: 12/11/17 12:38 PM  Result Value Ref Range   Fecal Occult Blood, POC Negative Negative   Card #1 Date     Card #2 Fecal Occult Blod, POC     Card #2 Date     Card #3 Fecal Occult Blood, POC     Card #3 Date      Assessment & Plan  1. Black tarry stools - POCT Occult Blood Stool - Negative in office  2. Fatigue, unspecified type - POCT Occult Blood Stool - Negative in office. 3. Orthostatic lightheadedness  - Strongly recommend she present for ER care as I am concerned that the patient is having an acute GI bleed.  She is in agreement and will drive herself over - declines EMS transport.  She appears reasonably stable - VSS, ambulating without difficulty -  for POV transport.

## 2017-12-11 NOTE — ED Triage Notes (Signed)
First Nurse Note:  AAOx3. Skin warm and dry. NAD.  Ambulates with easy and steady gait.

## 2017-12-11 NOTE — ED Notes (Signed)
Pt did not wait for dc papers, therefore no dep vitals.

## 2017-12-11 NOTE — ED Provider Notes (Signed)
[facility] Emergency Department Provider Note  ____________________________________________  Time seen: Approximately 3:51 PM  I have reviewed the triage vital signs and the nursing notes.   HISTORY  Chief Complaint GI Bleeding    HPI Linda Mejia is a 62 y.o. female with a history of CAD hypertension and anxiety who complains of diarrhea for the past month.  Is well controlled by taking antidiarrheal medicine.  She denies fevers chills or sweats.  No bloody stool but it is black often.  She does not recall with the antidiarrheal medicine is.  She does report a history of bleeding stomach ulcers.  No dizziness or syncope.  No exertional symptoms.  Symptoms are intermittent, no aggravating or alleviating factors, moderate intensity.  She is worried that she is having a GI bleed.      Past Medical History:  Diagnosis Date  . Abnormal ankle brachial index (ABI) 12/02/2016  . ADHD (attention deficit hyperactivity disorder)   . Anxiety   . Aortic atherosclerosis ([place]) 12/02/2016   July 2018  . Chronic back pain   . Coronary artery disease   . Herpes genitalis in women   . History of stomach ulcers    x7 this year. Bleeding  . Hypertension   . Prediabetes 12/02/2016     Patient Active Problem List   Diagnosis Date Noted  . Medication monitoring encounter 03/18/2017  . Colon cancer screening 03/18/2017  . Coronary artery calcification seen on CT scan 12/02/2016  . Aortic atherosclerosis ([place]) 12/02/2016  . Abnormal ankle brachial index (ABI) 12/02/2016  . Prediabetes 12/02/2016  . Fracture of rib 08/28/2016  . Chest pain 08/11/2016  . Hx of tobacco use, presenting hazards to health 06/09/2016  . Degenerative arthritis of knee, bilateral 12/27/2015  . Obesity 12/27/2015  . GI bleed 09/21/2015  . Reflux esophagitis   . Other diseases of stomach and duodenum   . Multiple joint pain 06/05/2015  . Night sweats 06/05/2015  . Headache 04/12/2015  .  Subclinical hypothyroidism 03/26/2015  . Herpes simplex type 2 infection 03/21/2015  . Rhinitis, allergic 03/21/2015  . Cervical pain 09/21/2014  . ADHD (attention deficit hyperactivity disorder), inattentive type 09/20/2014  . Episodic paroxysmal anxiety disorder 09/20/2014  . History of gastric ulcer 09/20/2014  . Abuse, drug or alcohol ([place]) 09/20/2014  . Substance abuse ([place]) 09/20/2014  . HLD (hyperlipidemia) 10/03/2009  . Coronary artery disease involving native coronary artery of native heart with angina pectoris ([place]) 10/02/2009  . Hypertension goal BP (blood pressure) < 140/90 10/02/2009     Past Surgical History:  Procedure Laterality Date  . CARDIAC CATHETERIZATION    . CESAREAN SECTION    . ESOPHAGOGASTRODUODENOSCOPY (EGD) WITH PROPOFOL N/A 09/21/2015   Procedure: ESOPHAGOGASTRODUODENOSCOPY (EGD) WITH PROPOFOL;  Surgeon: Lucilla Lame, MD;  Location: [facility] ENDOSCOPY;  Service: Endoscopy;  Laterality: N/A;  . ESOPHAGOGASTRODUODENOSCOPY (EGD) WITH PROPOFOL N/A 07/30/2016   Procedure: ESOPHAGOGASTRODUODENOSCOPY (EGD) WITH PROPOFOL;  Surgeon: Jonathon Bellows, MD;  Location: [facility] ENDOSCOPY;  Service: Endoscopy;  Laterality: N/A;  . LEFT HEART CATH AND CORONARY ANGIOGRAPHY Right 08/12/2016   Procedure: Left Heart Cath and Coronary Angiography;  Surgeon: Dionisio David, MD;  Location: [place] [facility];  Service: Cardiovascular;  Laterality: Right;  . TONSILLECTOMY    . TUBAL LIGATION       Prior to Admission medications   Medication Sig Start Date End Date Taking? Authorizing Provider  acyclovir (ZOVIRAX) 400 MG tablet take 1 tablet by mouth twice a day 11/13/17  Arnetha Courser, MD  amoxicillin-clavulanate (AUGMENTIN) 875-125 MG tablet Take 1 tablet by mouth 2 (two) times daily. Patient not taking: Reported on 12/11/2017 06/19/17   Arnetha Courser, MD  amphetamine-dextroamphetamine (ADDERALL) 30 MG tablet Take 1 tablet by mouth 2 (two) times daily. 12/02/16   Arnetha Courser, MD  aspirin  EC 81 MG tablet Take 81 mg by mouth daily.    [provider]  benzonatate (TESSALON) 100 MG capsule Take 1 capsule (100 mg total) by mouth 2 (two) times daily as needed for cough. 06/19/17   Lada, Satira Anis, MD  diazepam (VALIUM) 10 MG tablet Take 1 tablet (10 mg total) by mouth every 8 (eight) hours as needed for anxiety. 09/22/15   Fritzi Mandes, MD  erythromycin ophthalmic ointment Place 1 application into the left eye 3 (three) times daily. 06/10/17   Hubbard Hartshorn, FNP  famotidine (PEPCID) 20 MG tablet Take 1 tablet (20 mg total) by mouth 2 (two) times daily. 08/26/17 08/26/18  Darel Hong, MD  fluticasone (FLONASE) 50 MCG/ACT nasal spray Place 2 sprays into both nostrils as needed. 09/22/17   Arnetha Courser, MD  metoprolol tartrate (LOPRESSOR) 50 MG tablet Take 1 tablet (50 mg total) by mouth 2 (two) times daily. 06/10/17   Arnetha Courser, MD  omeprazole (PRILOSEC) 20 MG capsule Take by mouth.    [provider]  pantoprazole (PROTONIX) 40 MG tablet Take 1 tablet (40 mg total) by mouth daily. 08/26/17 08/26/18  Darel Hong, MD  PRALUENT 75 MG/ML SOPN Inject 75 mLs as directed every 14 (fourteen) days. 11/25/16   [provider]  pravastatin (PRAVACHOL) 40 MG tablet Take by mouth. 09/20/14   [provider]  ranolazine (RANEXA) 500 MG 12 hr tablet  09/21/17   [provider]  traMADol (ULTRAM) 50 MG tablet Take 1 tablet (50 mg total) by mouth every 6 (six) hours as needed. 11/22/17 11/22/18  Lavonia Drafts, MD  zolpidem (AMBIEN) 10 MG tablet Take by mouth.    [provider]     Allergies Patient has no known allergies.   Family History  Problem Relation Age of Onset  . Diabetes Mother   . CVA Mother   . Heart disease Father   . Heart disease Sister   . Heart disease Brother   . Heart disease Sister   . Heart disease Sister   . Heart disease Sister   . Heart disease Brother   . Heart disease Brother   . Heart disease Brother      Social History Social History   Tobacco Use  . Smoking status: Former Smoker    Packs/day: 1.00    Years: 30.00    Pack years: 30.00  . Smokeless tobacco: Never Used  Substance Use Topics  . Alcohol use: No    Frequency: Never    Comment: occasionally  . Drug use: No    Review of Systems  Constitutional:   No fever or chills.  ENT:   No sore throat. No rhinorrhea. Cardiovascular:   No chest pain or syncope. Respiratory:   No dyspnea or cough. Gastrointestinal:   Negative for abdominal pain, vomiting, positive chronic diarrhea Musculoskeletal:   Negative for focal pain or swelling All other systems reviewed and are negative except as documented above in ROS and HPI.  ____________________________________________   PHYSICAL EXAM:  VITAL SIGNS: ED Triage Vitals  Enc Vitals Group     BP 12/11/17 1231 (!) 172/95  Pulse Rate 12/11/17 1231 71     Resp 12/11/17 1231 18     Temp 12/11/17 1231 98.6 F (37 C)     Temp Source 12/11/17 1231 Oral     SpO2 12/11/17 1231 96 %     Weight 12/11/17 1231 200 lb (90.7 kg)     Height 12/11/17 1231 5\' 5"  (1.651 m)     Head Circumference --      Peak Flow --      Pain Score 12/11/17 1230 5     Pain Loc --      Pain Edu? --      Excl. in [place]? --     Vital signs reviewed, nursing assessments reviewed.   Constitutional:   Alert and oriented. Non-toxic appearance. Eyes:   Conjunctivae are normal. EOMI. PERRL. ENT      Head:   Normocephalic and atraumatic.      Nose:   No congestion/rhinnorhea.       Mouth/Throat:   MMM, no pharyngeal erythema. No peritonsillar mass.       Neck:   No meningismus. Full ROM. Hematological/Lymphatic/Immunilogical:   No cervical lymphadenopathy. Cardiovascular:   RRR. Symmetric bilateral radial and DP pulses.  No murmurs. Cap refill less than 2 seconds. Respiratory:   Normal respiratory effort without tachypnea/retractions. Breath sounds are clear and equal bilaterally. No  wheezes/rales/rhonchi. Gastrointestinal:   Soft and nontender. Non distended. There is no CVA tenderness.  No rebound, rigidity, or guarding.  Rectal exam reveals scant dark stool, Hemoccult negative,  Musculoskeletal:   Normal range of motion in all extremities. No joint effusions.  No lower extremity tenderness.  No edema. Neurologic:   Normal speech and language.  Motor grossly intact. No acute focal neurologic deficits are appreciated.  Skin:    Skin is warm, dry and intact. No rash noted.  No petechiae, purpura, or bullae.  ____________________________________________    LABS (pertinent positives/negatives) (all labs ordered are listed, but only abnormal results are displayed) Labs Reviewed  COMPREHENSIVE METABOLIC PANEL - Abnormal; Notable for the following components:      Result Value   Total Protein 8.2 (*)    All other components within normal limits  CBC  URINALYSIS, COMPLETE (UACMP) WITH MICROSCOPIC  POC OCCULT BLOOD, ED  TYPE AND SCREEN   ____________________________________________   EKG    ____________________________________________    RADIOLOGY  No results found.  ____________________________________________   PROCEDURES Procedures  ____________________________________________    CLINICAL IMPRESSION / ASSESSMENT AND PLAN / ED COURSE  Pertinent labs & imaging results that were available during my care of the patient were reviewed by me and considered in my medical decision making (see chart for details).    Patient presents with chronic diarrhea.  Controlled with over-the-counter medicines.  Vital signs are normal.  Exam is unremarkable.  Stable for discharge home.  Doubt varices or bleeding ulcer or perforation or obstruction.  Doubt significant GI bleed.  Labs are all normal.      ____________________________________________   FINAL CLINICAL IMPRESSION(S) / ED DIAGNOSES    Final diagnoses:  Chronic diarrhea     ED Discharge  Orders    None      Portions of this note were generated with dragon dictation software. Dictation errors may occur despite best attempts at proofreading.    Carrie Mew, MD 12/11/17 [phone]

## 2017-12-15 ENCOUNTER — Telehealth: Payer: Self-pay | Admitting: Family Medicine

## 2017-12-15 ENCOUNTER — Ambulatory Visit (INDEPENDENT_AMBULATORY_CARE_PROVIDER_SITE_OTHER): Payer: Medicare Other

## 2017-12-15 VITALS — BP 132/68 | HR 66 | Temp 97.9°F | Resp 12 | Ht 65.0 in | Wt 210.4 lb

## 2017-12-15 DIAGNOSIS — B372 Candidiasis of skin and nail: Secondary | ICD-10-CM

## 2017-12-15 DIAGNOSIS — Z23 Encounter for immunization: Secondary | ICD-10-CM

## 2017-12-15 DIAGNOSIS — Z0000 Encounter for general adult medical examination without abnormal findings: Secondary | ICD-10-CM

## 2017-12-15 MED ORDER — KETOCONAZOLE 2 % EX CREA: 1.0000 "application " | TOPICAL_CREAM | Freq: Every day | CUTANEOUS | 0 refills | Status: DC

## 2017-12-15 NOTE — Telephone Encounter (Signed)
Patient said that you all talked about her yeast under her breast but nothing never was called in. Please call her in something for this please.

## 2017-12-15 NOTE — Progress Notes (Signed)
Subjective:   Linda Mejia is a 62 y.o. female who presents for an Initial Medicare Annual Wellness Visit.  Review of Systems    N/A  Cardiac Risk Factors include: advanced age (>72men, >30 women);dyslipidemia;hypertension;obesity (BMI >30kg/m2);sedentary lifestyle     Objective:    Today's Vitals   12/15/17 0812  BP: 132/68  Pulse: 66  Resp: 12  Temp: 97.9 F (36.6 C)  TempSrc: Oral  SpO2: 94%  Weight: 210 lb 6.4 oz (95.4 kg)  Height: 5\' 5"  (1.651 m)   Body mass index is 35.01 kg/m.  Advanced Directives 12/15/2017 12/11/2017 11/22/2017 01/28/2017 01/07/2017 12/22/2016 12/02/2016  Does Patient Have a Medical Advance Directive? No No No No No No No  Would patient like information on creating a medical advance directive? Yes (MAU/Ambulatory/Procedural Areas - Information given) - No - Patient declined - - - -  Some encounter information is confidential and restricted. Go to Review Flowsheets activity to see all data.    Current Medications (verified) Outpatient Encounter Medications as of 12/15/2017  Medication Sig  . acyclovir (ZOVIRAX) 400 MG tablet take 1 tablet by mouth twice a day  . amphetamine-dextroamphetamine (ADDERALL) 30 MG tablet Take 1 tablet by mouth 2 (two) times daily.  . Ascorbic Acid (VITAMIN C) 100 MG tablet Take 100 mg by mouth daily.  Marland Kitchen aspirin EC 81 MG tablet Take 81 mg by mouth daily.  . cholecalciferol (VITAMIN D) 1000 units tablet Take 1,000 Units by mouth daily.  . diazepam (VALIUM) 10 MG tablet Take 1 tablet (10 mg total) by mouth every 8 (eight) hours as needed for anxiety.  Marland Kitchen erythromycin ophthalmic ointment Place 1 application into the left eye 3 (three) times daily.  . fluticasone (FLONASE) 50 MCG/ACT nasal spray Place 2 sprays into both nostrils as needed.  . metoprolol tartrate (LOPRESSOR) 50 MG tablet Take 1 tablet (50 mg total) by mouth 2 (two) times daily.  . Omega-3 Fatty Acids (FISH OIL) 1000 MG CAPS Take 1 capsule by mouth daily.  Marland Kitchen PRALUENT  75 MG/ML SOPN Inject 75 mLs as directed every 14 (fourteen) days.  . pravastatin (PRAVACHOL) 40 MG tablet Take by mouth.  Marland Kitchen amoxicillin-clavulanate (AUGMENTIN) 875-125 MG tablet Take 1 tablet by mouth 2 (two) times daily. (Patient not taking: Reported on 12/11/2017)  . benzonatate (TESSALON) 100 MG capsule Take 1 capsule (100 mg total) by mouth 2 (two) times daily as needed for cough.  . famotidine (PEPCID) 20 MG tablet Take 1 tablet (20 mg total) by mouth 2 (two) times daily. (Patient not taking: Reported on 12/15/2017)  . omeprazole (PRILOSEC) 20 MG capsule Take by mouth.  . pantoprazole (PROTONIX) 40 MG tablet Take 1 tablet (40 mg total) by mouth daily. (Patient not taking: Reported on 12/15/2017)  . ranolazine (RANEXA) 500 MG 12 hr tablet   . traMADol (ULTRAM) 50 MG tablet Take 1 tablet (50 mg total) by mouth every 6 (six) hours as needed. (Patient not taking: Reported on 12/15/2017)  . zolpidem (AMBIEN) 10 MG tablet Take by mouth.   No facility-administered encounter medications on file as of 12/15/2017.     Allergies (verified) Patient has no known allergies.   History: Past Medical History:  Diagnosis Date  . Abnormal ankle brachial index (ABI) 12/02/2016  . ADHD (attention deficit hyperactivity disorder)   . Anxiety   . Aortic atherosclerosis ([place]) 12/02/2016   July 2018  . Chronic back pain   . Coronary artery disease   . Herpes genitalis in women   .  History of stomach ulcers    x7 this year. Bleeding  . Hypertension   . Prediabetes 12/02/2016   Past Surgical History:  Procedure Laterality Date  . CARDIAC CATHETERIZATION    . CESAREAN SECTION    . ESOPHAGOGASTRODUODENOSCOPY (EGD) WITH PROPOFOL N/A 09/21/2015   Procedure: ESOPHAGOGASTRODUODENOSCOPY (EGD) WITH PROPOFOL;  Surgeon: Lucilla Lame, MD;  Location: [facility] ENDOSCOPY;  Service: Endoscopy;  Laterality: N/A;  . ESOPHAGOGASTRODUODENOSCOPY (EGD) WITH PROPOFOL N/A 07/30/2016   Procedure: ESOPHAGOGASTRODUODENOSCOPY (EGD) WITH  PROPOFOL;  Surgeon: Jonathon Bellows, MD;  Location: [facility] ENDOSCOPY;  Service: Endoscopy;  Laterality: N/A;  . LEFT HEART CATH AND CORONARY ANGIOGRAPHY Right 08/12/2016   Procedure: Left Heart Cath and Coronary Angiography;  Surgeon: Dionisio David, MD;  Location: [place] [facility];  Service: Cardiovascular;  Laterality: Right;  . TONSILLECTOMY    . TUBAL LIGATION     Family History  Problem Relation Age of Onset  . Diabetes Mother   . CVA Mother   . Heart disease Father   . Heart disease Sister   . Heart disease Brother   . Heart disease Sister   . Heart disease Sister   . Heart disease Sister   . Heart disease Brother   . Heart disease Brother   . Heart disease Brother    Social History   Socioeconomic History  . Marital status: Divorced    Spouse name: Not on file  . Number of children: 2  . Years of education: Not on file  . Highest education level: GED or equivalent  Occupational History  . Occupation: disabled  Social Needs  . Financial resource strain: Not hard at all  . Food insecurity:    Worry: Never true    Inability: Never true  . Transportation needs:    Medical: No    Non-medical: No  Tobacco Use  . Smoking status: Former Smoker    Packs/day: 1.00    Years: 30.00    Pack years: 30.00    Types: Cigarettes    Last attempt to quit: 2006    Years since quitting: 13.6  . Smokeless tobacco: Never Used  . Tobacco comment: smoking cessation materials not required  Substance and Sexual Activity  . Alcohol use: Yes    Frequency: Never    Comment: occasionally  . Drug use: No  . Sexual activity: Not Currently    Birth control/protection: None, Post-menopausal  Lifestyle  . Physical activity:    Days per week: 0 days    Minutes per session: 0 min  . Stress: Not at all  Relationships  . Social connections:    Talks on phone: Patient refused    Gets together: Patient refused    Attends religious service: Patient refused    Active member of club or  organization: Patient refused    Attends meetings of clubs or organizations: Patient refused    Relationship status: Divorced  Other Topics Concern  . Not on file  Social History Narrative  . Not on file    Tobacco Counseling Counseling given: No Comment: smoking cessation materials not required  Clinical Intake:  Pre-visit preparation completed: Yes  Pain : No/denies pain   BMI - recorded: 35.01 Nutritional Status: BMI > 30  Obese Nutritional Risks: None Diabetes: No  How often do you need to have someone help you when you read instructions, pamphlets, or other written materials from your doctor or pharmacy?: 1 - Never  Interpreter Needed?: No  Information entered by :: [place],  LPN   Activities of Daily Living In your present state of health, do you have any difficulty performing the following activities: 12/15/2017 06/19/2017  Hearing? N N  Comment denies hearing aids -  Vision? N N  Comment wears eyeglasses -  Difficulty concentrating or making decisions? N N  Walking or climbing stairs? Y N  Comment back pain -  Dressing or bathing? N N  Doing errands, shopping? N N  Preparing Food and eating ? N -  Comment upper dentures -  Using the Toilet? N -  In the past six months, have you accidently leaked urine? N -  Do you have problems with loss of bowel control? Y -  Comment occ loose stool -  Managing your Medications? N -  Managing your Finances? N -  Housekeeping or managing your Housekeeping? N -  Some recent data might be hidden     Immunizations and Health Maintenance Immunization History  Administered Date(s) Administered  . Influenza,inj,Quad PF,6+ Mos 03/26/2015, 06/09/2016, 03/18/2017, 12/15/2017  . Tdap 06/09/2016   Health Maintenance Due  Topic Date Due  . MAMMOGRAM  09/18/1973    Patient Care Team: Arnetha Courser, MD as PCP - General (Family Medicine)  Indicate any recent Medical Services you may have received from other than [facility]  providers in the past year (date may be approximate).     Assessment:   This is a routine wellness examination for Prim.  Hearing/Vision screen Vision Screening Comments: Not established with a provider for annual eye exams  Dietary issues and exercise activities discussed: Current Exercise Habits: The patient does not participate in regular exercise at present, Exercise limited by: None identified  Goals    . DIET - INCREASE WATER INTAKE     Recommend to drink at least 6-8 8oz glasses of water per day.      Depression Screen PHQ 2/9 Scores 12/15/2017 12/11/2017 06/19/2017 03/18/2017 12/22/2016 12/02/2016 06/09/2016  PHQ - 2 Score 0 0 0 0 0 0 0  PHQ- 9 Score 0 0 - - 0 - -    Fall Risk Fall Risk  12/15/2017 12/11/2017 06/19/2017 03/18/2017 12/02/2016  Falls in the past year? No No No No No  Number falls in past yr: - - - - -  Injury with Fall? - - - - -  [place] for fall due to : Impaired vision;History of fall(s);Medication side effect - - - -  Risk for fall due to: Comment wears eyeglasses; back pain; fell in tub - - - -    FALL RISK PREVENTION PERTAINING TO HOME: Is your home free of loose throw rugs in walkways, pet beds, electrical cords, etc? Yes Is there adequate lighting in your home to reduce risk of falls?  Yes Are there stairs in or around your home WITH handrails? Yes  ASSISTIVE DEVICES UTILIZED TO PREVENT FALLS: Use of a cane, walker or w/c? No Grab bars in the bathroom? Yes  Shower chair or a place to sit while bathing? No An elevated toilet seat or a handicapped toilet? No  Timed Get Up and Go Performed: Yes. Pt ambulated 10 feet within 14 sec. Gait slow, steady and without the use of an assistive device. No intervention required at this time. Fall risk prevention has been discussed.  Community Resource Referral:  Pt declined my offer to send [employer] Referral to Care Guide for a shower chair or an elevated toilet seat.  Cognitive Function:  6CIT Screen 12/15/2017  What Year? 0 points  What month? 0 points  What time? 0 points  Count back from 20 0 points  Months in reverse 0 points  Repeat phrase 2 points  Total Score 2    Screening Tests Health Maintenance  Topic Date Due  . MAMMOGRAM  09[birth year]18/1973  . PAP SMEAR  03/25/2018  . Fecal DNA (Cologuard)  06/24/2020  . TETANUS/TDAP  06/09/2026  . INFLUENZA VACCINE  Completed  . Hepatitis C Screening  Completed  . HIV Screening  Completed    Qualifies for Shingles Vaccine? Yes. Due for Shingrix. Education has been provided regarding the importance of this vaccine. Pt has been advised to call insurance company to determine out of pocket expense. Advised may also receive vaccine at local pharmacy or Health Dept. Verbalized acceptance and understanding.  Cancer Screenings: Lung: Low Dose CT Chest recommended if Age 47-80 years, 30 pack-year currently smoking OR have quit w/in 15years. Patient does not qualify. Breast: Up to date on Mammogram? No. Ordered by Dr. Sanda Klein 03/18/17. Not yet completed. Provided with contact info and advised to schedule appt.   Bone Density/Dexa: Not yet required Colorectal: Completed Cologuard 06/24/17. Repeat every 3 years  Additional Screenings: Hepatitis C Screening: Completed 06/09/16   Plan:  I have personally reviewed and addressed the Medicare Annual Wellness questionnaire and have noted the following in the patient's chart:  A. Medical and social history B. Use of alcohol, tobacco or illicit drugs  C. Current medications and supplements D. Functional ability and status E.  Nutritional status F.  Physical activity G. Advance directives H. List of other physicians I.  Hospitalizations, surgeries, and ER visits in previous 12 months J.  [place] such as hearing and vision if needed, cognitive and depression L. Referrals and appointments  In addition, I have reviewed and discussed with patient certain preventive protocols,  quality metrics, and best practice recommendations. A written personalized care plan for preventive services as well as general preventive health recommendations were provided to patient.  See attached scanned questionnaire for additional information.   Signed,  Aleatha Borer, LPN Nurse Health Advisor

## 2017-12-15 NOTE — Telephone Encounter (Signed)
Ketoconazole Cream sent in - apply once daily after cleansing the area with gentle soap and drying the area completely.    How is she feeling? Have her BM's improved - is she still having diarrhea?

## 2017-12-15 NOTE — Patient Instructions (Signed)
Linda Mejia , Thank you for taking time to come for your Medicare Wellness Visit. I appreciate your ongoing commitment to your health goals. Please review the following plan we discussed and let me know if I can assist you in the future.   Screening recommendations/referrals: Colorectal Screening: Up to date Mammogram: Please call to schedule your appointment Bone Density: Not yet required  Vision and Dental Exams: Recommended annual ophthalmology exams for early detection of glaucoma and other disorders of the eye Recommended annual dental exams for proper oral hygiene  Vaccinations: Influenza vaccine: Completed today Pneumococcal vaccine: Not yet required Tdap vaccine: Up to date Shingles vaccine: Please call your insurance company to determine your out of pocket expense for the Shingrix vaccine. You may also receive this vaccine at your local pharmacy or Health Dept.    Advanced directives: Advance directive discussed with you today. I have provided a copy for you to complete at home and have notarized. Once this is complete please bring a copy in to our office so we can scan it into your chart.  Goals: Recommend to drink at least 6-8 8oz glasses of water per day.  Next appointment: Please schedule your Annual Wellness Visit with your Nurse Health Advisor in one year.  Preventive Care 18 Years and Older, Female Preventive care refers to lifestyle choices and visits with your health care provider that can promote health and wellness. What does preventive care include?  A yearly physical exam. This is also called an annual well check.  Dental exams once or twice a year.  Routine eye exams. Ask your health care provider how often you should have your eyes checked.  Personal lifestyle choices, including:  Daily care of your teeth and gums.  Regular physical activity.  Eating a healthy diet.  Avoiding tobacco and drug use.  Limiting alcohol use.  Practicing safe sex.  Taking  low-dose aspirin every day.  Taking vitamin and mineral supplements as recommended by your health care provider. What happens during an annual well check? The services and screenings done by your health care provider during your annual well check will depend on your age, overall health, lifestyle risk factors, and family history of disease. Counseling  Your health care provider may ask you questions about your:  Alcohol use.  Tobacco use.  Drug use.  Emotional well-being.  Home and relationship well-being.  Sexual activity.  Eating habits.  History of falls.  Memory and ability to understand (cognition).  Work and work Statistician.  Reproductive health. Screening  You may have the following tests or measurements:  Height, weight, and BMI.  Blood pressure.  Lipid and cholesterol levels. These may be checked every 5 years, or more frequently if you are over 34 years old.  Skin check.  Lung cancer screening. You may have this screening every year starting at age 75 if you have a 30-pack-year history of smoking and currently smoke or have quit within the past 15 years.  Fecal occult blood test (FOBT) of the stool. You may have this test every year starting at age 106.  Flexible sigmoidoscopy or colonoscopy. You may have a sigmoidoscopy every 5 years or a colonoscopy every 10 years starting at age 30.  Hepatitis C blood test.  Hepatitis B blood test.  Sexually transmitted disease (STD) testing.  Diabetes screening. This is done by checking your blood sugar (glucose) after you have not eaten for a while (fasting). You may have this done every 1-3 years.  Bone density  scan. This is done to screen for osteoporosis. You may have this done starting at age 46.  Mammogram. This may be done every 1-2 years. Talk to your health care provider about how often you should have regular mammograms. Talk with your health care provider about your test results, treatment options, and  if necessary, the need for more tests. Vaccines  Your health care provider may recommend certain vaccines, such as:  Influenza vaccine. This is recommended every year.  Tetanus, diphtheria, and acellular pertussis (Tdap, Td) vaccine. You may need a Td booster every 10 years.  Zoster vaccine. You may need this after age 71.  Pneumococcal 13-valent conjugate (PCV13) vaccine. One dose is recommended after age 32.  Pneumococcal polysaccharide (PPSV23) vaccine. One dose is recommended after age 24. Talk to your health care provider about which screenings and vaccines you need and how often you need them. This information is not intended to replace advice given to you by your health care provider. Make sure you discuss any questions you have with your health care provider. Document Released: 04/27/2015 Document Revised: 12/19/2015 Document Reviewed: 01/30/2015 Elsevier Interactive Patient Education  2017 [place] Prevention in the Home Falls can cause injuries. They can happen to people of all ages. There are many things you can do to make your home safe and to help prevent falls. What can I do on the outside of my home?  Regularly fix the edges of walkways and driveways and fix any cracks.  Remove anything that might make you trip as you walk through a door, such as a raised step or threshold.  Trim any bushes or trees on the path to your home.  Use bright outdoor lighting.  Clear any walking paths of anything that might make someone trip, such as rocks or tools.  Regularly check to see if handrails are loose or broken. Make sure that both sides of any steps have handrails.  Any raised decks and porches should have guardrails on the edges.  Have any leaves, snow, or ice cleared regularly.  Use sand or salt on walking paths during winter.  Clean up any spills in your garage right away. This includes oil or grease spills. What can I do in the bathroom?  Use night  lights.  Install grab bars by the toilet and in the tub and shower. Do not use towel bars as grab bars.  Use non-skid mats or decals in the tub or shower.  If you need to sit down in the shower, use a plastic, non-slip stool.  Keep the floor dry. Clean up any water that spills on the floor as soon as it happens.  Remove soap buildup in the tub or shower regularly.  Attach bath mats securely with double-sided non-slip rug tape.  Do not have throw rugs and other things on the floor that can make you trip. What can I do in the bedroom?  Use night lights.  Make sure that you have a light by your bed that is easy to reach.  Do not use any sheets or blankets that are too big for your bed. They should not hang down onto the floor.  Have a firm chair that has side arms. You can use this for support while you get dressed.  Do not have throw rugs and other things on the floor that can make you trip. What can I do in the kitchen?  Clean up any spills right away.  Avoid walking on wet  floors.  Keep items that you use a lot in easy-to-reach places.  If you need to reach something above you, use a strong step stool that has a grab bar.  Keep electrical cords out of the way.  Do not use floor polish or wax that makes floors slippery. If you must use wax, use non-skid floor wax.  Do not have throw rugs and other things on the floor that can make you trip. What can I do with my stairs?  Do not leave any items on the stairs.  Make sure that there are handrails on both sides of the stairs and use them. Fix handrails that are broken or loose. Make sure that handrails are as long as the stairways.  Check any carpeting to make sure that it is firmly attached to the stairs. Fix any carpet that is loose or worn.  Avoid having throw rugs at the top or bottom of the stairs. If you do have throw rugs, attach them to the floor with carpet tape.  Make sure that you have a light switch at the  top of the stairs and the bottom of the stairs. If you do not have them, ask someone to add them for you. What else can I do to help prevent falls?  Wear shoes that:  Do not have high heels.  Have rubber bottoms.  Are comfortable and fit you well.  Are closed at the toe. Do not wear sandals.  If you use a stepladder:  Make sure that it is fully opened. Do not climb a closed stepladder.  Make sure that both sides of the stepladder are locked into place.  Ask someone to hold it for you, if possible.  Clearly mark and make sure that you can see:  Any grab bars or handrails.  First and last steps.  Where the edge of each step is.  Use tools that help you move around (mobility aids) if they are needed. These include:  Canes.  Walkers.  Scooters.  Crutches.  Turn on the lights when you go into a dark area. Replace any light bulbs as soon as they burn out.  Set up your furniture so you have a clear path. Avoid moving your furniture around.  If any of your floors are uneven, fix them.  If there are any pets around you, be aware of where they are.  Review your medicines with your doctor. Some medicines can make you feel dizzy. This can increase your chance of falling. Ask your doctor what other things that you can do to help prevent falls. This information is not intended to replace advice given to you by your health care provider. Make sure you discuss any questions you have with your health care provider. Document Released: 01/25/2009 Document Revised: 09/06/2015 Document Reviewed: 05/05/2014 Elsevier Interactive Patient Education  2017 [employer].

## 2017-12-21 NOTE — Telephone Encounter (Signed)
Pt called back; pt states she doesn't pick up restricted numbers; have Amy(??) or whomever reached out to pt to contact

## 2018-01-22 ENCOUNTER — Other Ambulatory Visit: Payer: Self-pay

## 2018-01-22 ENCOUNTER — Encounter: Payer: Self-pay | Admitting: Family Medicine

## 2018-01-22 ENCOUNTER — Other Ambulatory Visit
Admission: RE | Admit: 2018-01-22 | Discharge: 2018-01-22 | Disposition: A | Discharge status: Home / Self Care | Payer: Medicare Other | Source: Ambulatory Visit | Attending: Family Medicine | Admitting: Family Medicine

## 2018-01-22 ENCOUNTER — Ambulatory Visit
Admission: RE | Admit: 2018-01-22 | Discharge: 2018-01-22 | Disposition: A | Discharge status: Home / Self Care | Payer: Medicare Other | Source: Ambulatory Visit | Attending: Family Medicine | Admitting: Family Medicine

## 2018-01-22 ENCOUNTER — Ambulatory Visit (INDEPENDENT_AMBULATORY_CARE_PROVIDER_SITE_OTHER): Payer: Medicare Other | Admitting: Family Medicine

## 2018-01-22 ENCOUNTER — Emergency Department: Payer: Medicare Other

## 2018-01-22 ENCOUNTER — Emergency Department
Admission: EM | Admit: 2018-01-22 | Discharge: 2018-01-22 | Disposition: A | Discharge status: Home / Self Care | Payer: Medicare Other | Source: Home / Self Care | Attending: Emergency Medicine | Admitting: Emergency Medicine

## 2018-01-22 VITALS — BP 128/72 | HR 97 | Temp 98.1°F | Ht 66.0 in | Wt 207.2 lb

## 2018-01-22 DIAGNOSIS — I10 Essential (primary) hypertension: Secondary | ICD-10-CM

## 2018-01-22 DIAGNOSIS — R0602 Shortness of breath: Secondary | ICD-10-CM

## 2018-01-22 DIAGNOSIS — M545 Low back pain: Secondary | ICD-10-CM | POA: Diagnosis not present

## 2018-01-22 DIAGNOSIS — Z7952 Long term (current) use of systemic steroids: Secondary | ICD-10-CM | POA: Diagnosis not present

## 2018-01-22 DIAGNOSIS — I25110 Atherosclerotic heart disease of native coronary artery with unstable angina pectoris: Secondary | ICD-10-CM | POA: Diagnosis not present

## 2018-01-22 DIAGNOSIS — E039 Hypothyroidism, unspecified: Secondary | ICD-10-CM

## 2018-01-22 DIAGNOSIS — Z87891 Personal history of nicotine dependence: Secondary | ICD-10-CM | POA: Insufficient documentation

## 2018-01-22 DIAGNOSIS — I700 Atherosclerosis of aorta: Secondary | ICD-10-CM | POA: Diagnosis not present

## 2018-01-22 DIAGNOSIS — R079 Chest pain, unspecified: Secondary | ICD-10-CM | POA: Diagnosis not present

## 2018-01-22 DIAGNOSIS — I252 Old myocardial infarction: Secondary | ICD-10-CM | POA: Diagnosis not present

## 2018-01-22 DIAGNOSIS — R9431 Abnormal electrocardiogram [ECG] [EKG]: Secondary | ICD-10-CM | POA: Diagnosis not present

## 2018-01-22 DIAGNOSIS — E6609 Other obesity due to excess calories: Secondary | ICD-10-CM | POA: Diagnosis not present

## 2018-01-22 DIAGNOSIS — G8929 Other chronic pain: Secondary | ICD-10-CM | POA: Diagnosis not present

## 2018-01-22 DIAGNOSIS — I25119 Atherosclerotic heart disease of native coronary artery with unspecified angina pectoris: Secondary | ICD-10-CM

## 2018-01-22 DIAGNOSIS — L237 Allergic contact dermatitis due to plants, except food: Secondary | ICD-10-CM

## 2018-01-22 DIAGNOSIS — Z79899 Other long term (current) drug therapy: Secondary | ICD-10-CM

## 2018-01-22 DIAGNOSIS — Z7982 Long term (current) use of aspirin: Secondary | ICD-10-CM

## 2018-01-22 DIAGNOSIS — I110 Hypertensive heart disease with heart failure: Secondary | ICD-10-CM | POA: Diagnosis not present

## 2018-01-22 DIAGNOSIS — R000 Tachycardia, unspecified: Secondary | ICD-10-CM | POA: Diagnosis not present

## 2018-01-22 DIAGNOSIS — I2510 Atherosclerotic heart disease of native coronary artery without angina pectoris: Secondary | ICD-10-CM

## 2018-01-22 DIAGNOSIS — J449 Chronic obstructive pulmonary disease, unspecified: Secondary | ICD-10-CM | POA: Diagnosis not present

## 2018-01-22 DIAGNOSIS — Z955 Presence of coronary angioplasty implant and graft: Secondary | ICD-10-CM | POA: Diagnosis not present

## 2018-01-22 DIAGNOSIS — I5033 Acute on chronic diastolic (congestive) heart failure: Secondary | ICD-10-CM | POA: Diagnosis not present

## 2018-01-22 DIAGNOSIS — Z8249 Family history of ischemic heart disease and other diseases of the circulatory system: Secondary | ICD-10-CM | POA: Diagnosis not present

## 2018-01-22 DIAGNOSIS — Z6833 Body mass index (BMI) 33.0-33.9, adult: Secondary | ICD-10-CM

## 2018-01-22 DIAGNOSIS — I200 Unstable angina: Secondary | ICD-10-CM | POA: Diagnosis not present

## 2018-01-22 DIAGNOSIS — Z7951 Long term (current) use of inhaled steroids: Secondary | ICD-10-CM | POA: Diagnosis not present

## 2018-01-22 DIAGNOSIS — R0609 Other forms of dyspnea: Secondary | ICD-10-CM | POA: Diagnosis not present

## 2018-01-22 DIAGNOSIS — Z833 Family history of diabetes mellitus: Secondary | ICD-10-CM | POA: Diagnosis not present

## 2018-01-22 LAB — CBC
HCT: 36.4 % (ref 36.0–46.0)
Hemoglobin: 12 g/dL (ref 12.0–15.0)
MCH: 31.2 pg (ref 26.0–34.0)
MCHC: 33 g/dL (ref 30.0–36.0)
MCV: 94.5 fL (ref 80.0–100.0)
Platelets: 311 10*3/uL (ref 150–400)
RBC: 3.85 MIL/uL — ABNORMAL LOW (ref 3.87–5.11)
RDW: 13.3 % (ref 11.5–15.5)
WBC: 6.2 10*3/uL (ref 4.0–10.5)
nRBC: 0 % (ref 0.0–0.2)

## 2018-01-22 LAB — TSH: TSH: 1.971 u[IU]/mL (ref 0.350–4.500)

## 2018-01-22 LAB — BASIC METABOLIC PANEL
Anion gap: 12 (ref 5–15)
BUN: 14 mg/dL (ref 8–23)
CO2: 28 mmol/L (ref 22–32)
Calcium: 8.5 mg/dL — ABNORMAL LOW (ref 8.9–10.3)
Chloride: 98 mmol/L (ref 98–111)
Creatinine, Ser: 0.76 mg/dL (ref 0.44–1.00)
GFR calc Af Amer: >60 mL/min (ref >60)
GFR calc non Af Amer: >60 mL/min (ref >60)
Glucose, Bld: 101 mg/dL — ABNORMAL HIGH (ref 70–99)
Potassium: 4.2 mmol/L (ref 3.5–5.1)
Sodium: 138 mmol/L (ref 135–145)

## 2018-01-22 LAB — HEPATIC FUNCTION PANEL
ALT: 12 U/L (ref 0–44)
AST: 25 U/L (ref 15–41)
Albumin: 3.7 g/dL (ref 3.5–5.0)
Alkaline Phosphatase: 67 U/L (ref 38–126)
Bilirubin, Direct: 0.1 mg/dL (ref 0.0–0.2)
Indirect Bilirubin: 0.4 mg/dL (ref 0.3–0.9)
Total Bilirubin: 0.5 mg/dL (ref 0.3–1.2)
Total Protein: 7 g/dL (ref 6.5–8.1)

## 2018-01-22 LAB — FIBRIN DERIVATIVES D-DIMER (ARMC ONLY): Fibrin derivatives D-dimer (ARMC): 1108.93 ng/mL (FEU) — ABNORMAL HIGH (ref 0.00–499.00)

## 2018-01-22 LAB — BRAIN NATRIURETIC PEPTIDE: B Natriuretic Peptide: 42 pg/mL (ref 0.0–100.0)

## 2018-01-22 LAB — TROPONIN I: Troponin I: <0.03 ng/mL (ref <0.03)

## 2018-01-22 MED ORDER — KETOROLAC TROMETHAMINE 30 MG/ML IJ SOLN
15.0000 mg | Freq: Once | INTRAMUSCULAR | Status: AC
  Administered 2018-01-22: 15 mg via INTRAVENOUS

## 2018-01-22 MED ORDER — RANOLAZINE ER 500 MG PO TB12: 1000.0000 mg | ORAL_TABLET | Freq: Two times a day (BID) | ORAL | 0 refills | Status: DC

## 2018-01-22 MED ORDER — IOHEXOL 350 MG/ML SOLN
75.0000 mL | Freq: Once | INTRAVENOUS | Status: AC | PRN
  Administered 2018-01-22: 75 mL via INTRAVENOUS

## 2018-01-22 MED ORDER — ACETAMINOPHEN 500 MG PO TABS
ORAL_TABLET | ORAL | Status: AC
  Filled 2018-01-22: qty 2

## 2018-01-22 MED ORDER — TRIAMCINOLONE ACETONIDE 0.1 % EX CREA: 1.0000 "application " | TOPICAL_CREAM | Freq: Two times a day (BID) | CUTANEOUS | 0 refills | Status: DC

## 2018-01-22 MED ORDER — ALBUTEROL SULFATE (2.5 MG/3ML) 0.083% IN NEBU
5.0000 mg | INHALATION_SOLUTION | Freq: Once | RESPIRATORY_TRACT | Status: AC
  Administered 2018-01-22: 5 mg via RESPIRATORY_TRACT
  Filled 2018-01-22: qty 6

## 2018-01-22 MED ORDER — KETOROLAC TROMETHAMINE 30 MG/ML IJ SOLN: 30.0000 mg | Freq: Once | INTRAMUSCULAR | Status: DC

## 2018-01-22 MED ORDER — KETOROLAC TROMETHAMINE 30 MG/ML IJ SOLN
INTRAMUSCULAR | Status: AC
  Administered 2018-01-22: 15 mg via INTRAVENOUS
  Filled 2018-01-22: qty 1

## 2018-01-22 NOTE — ED Provider Notes (Signed)
[facility] Emergency Department Provider Note  ____________________________________________  Time seen: Approximately 2:36 PM  I have reviewed the triage vital signs and the nursing notes.   HISTORY  Chief Complaint Shortness of Breath    HPI Linda Mejia is a 62 y.o. female with a history of anxiety, chronic back pain, CAD, hypertension who complains of shortness of breath, worse with exertion, present for at least a month.  She reports that over the past few days it has been worse and associated with some ankle swelling which is currently resolved.  Shortness of breath is constant but exertional.  No alleviating factors.  She feels like she has wheezing at night as well.  She is more comfortable sitting upright, more short of breath try to lay down on 2 pillows which is her norm.  No cough fevers chills or sweats.  No weight changes.  She saw her PCP this morning for blisters on the hand that happened after gardening and pulling weeds.  She was sent to the ED for stat labs, but while in the lab was sent to the emergency room due to her shortness of breath.  Patient is frustrated and irate by her ongoing symptoms.      Past Medical History:  Diagnosis Date  . Abnormal ankle brachial index (ABI) 12/02/2016  . ADHD (attention deficit hyperactivity disorder)   . Anxiety   . Aortic atherosclerosis ([place]) 12/02/2016   July 2018  . Chronic back pain   . Coronary artery disease   . Herpes genitalis in women   . History of stomach ulcers    x7 this year. Bleeding  . Hypertension   . Prediabetes 12/02/2016     Patient Active Problem List   Diagnosis Date Noted  . Medication monitoring encounter 03/18/2017  . Colon cancer screening 03/18/2017  . Coronary artery calcification seen on CT scan 12/02/2016  . Aortic atherosclerosis ([place]) 12/02/2016  . Abnormal ankle brachial index (ABI) 12/02/2016  . Prediabetes 12/02/2016  . Fracture of rib 08/28/2016  . Chest  pain 08/11/2016  . Hx of tobacco use, presenting hazards to health 06/09/2016  . Degenerative arthritis of knee, bilateral 12/27/2015  . Obesity 12/27/2015  . GI bleed 09/21/2015  . Reflux esophagitis   . Other diseases of stomach and duodenum   . Multiple joint pain 06/05/2015  . Night sweats 06/05/2015  . Headache 04/12/2015  . Subclinical hypothyroidism 03/26/2015  . Herpes simplex type 2 infection 03/21/2015  . Rhinitis, allergic 03/21/2015  . Cervical pain 09/21/2014  . ADHD (attention deficit hyperactivity disorder), inattentive type 09/20/2014  . Episodic paroxysmal anxiety disorder 09/20/2014  . History of gastric ulcer 09/20/2014  . Abuse, drug or alcohol ([place]) 09/20/2014  . Substance abuse ([place]) 09/20/2014  . HLD (hyperlipidemia) 10/03/2009  . Coronary artery disease involving native coronary artery of native heart with angina pectoris ([place]) 10/02/2009  . Hypertension goal BP (blood pressure) < 140/90 10/02/2009     Past Surgical History:  Procedure Laterality Date  . CARDIAC CATHETERIZATION    . CESAREAN SECTION    . ESOPHAGOGASTRODUODENOSCOPY (EGD) WITH PROPOFOL N/A 09/21/2015   Procedure: ESOPHAGOGASTRODUODENOSCOPY (EGD) WITH PROPOFOL;  Surgeon: Lucilla Lame, MD;  Location: [facility] ENDOSCOPY;  Service: Endoscopy;  Laterality: N/A;  . ESOPHAGOGASTRODUODENOSCOPY (EGD) WITH PROPOFOL N/A 07/30/2016   Procedure: ESOPHAGOGASTRODUODENOSCOPY (EGD) WITH PROPOFOL;  Surgeon: Jonathon Bellows, MD;  Location: [facility] ENDOSCOPY;  Service: Endoscopy;  Laterality: N/A;  . LEFT HEART CATH AND CORONARY ANGIOGRAPHY Right 08/12/2016  Procedure: Left Heart Cath and Coronary Angiography;  Surgeon: Dionisio David, MD;  Location: [facility];  Service: Cardiovascular;  Laterality: Right;  . TONSILLECTOMY    . TUBAL LIGATION       Prior to Admission medications   Medication Sig Start Date End Date Taking? Authorizing Provider  acyclovir (ZOVIRAX) 400 MG tablet take 1 tablet by mouth twice a day  11/13/17   Arnetha Courser, MD  amphetamine-dextroamphetamine (ADDERALL) 30 MG tablet Take 20 mg by mouth 2 (two) times daily.  12/02/16   Arnetha Courser, MD  Ascorbic Acid (VITAMIN C) 100 MG tablet Take 100 mg by mouth daily.    [provider]  aspirin EC 81 MG tablet Take 81 mg by mouth daily.    [provider]  cholecalciferol (VITAMIN D) 1000 units tablet Take 1,000 Units by mouth daily.    [provider]  diazepam (VALIUM) 10 MG tablet Take 1 tablet (10 mg total) by mouth every 8 (eight) hours as needed for anxiety. 09/22/15   Fritzi Mandes, MD  fluticasone (FLONASE) 50 MCG/ACT nasal spray Place 2 sprays into both nostrils as needed. 09/22/17   Arnetha Courser, MD  metoprolol tartrate (LOPRESSOR) 50 MG tablet Take 1 tablet (50 mg total) by mouth 2 (two) times daily. 06/10/17   Arnetha Courser, MD  Omega-3 Fatty Acids (FISH OIL) 1000 MG CAPS Take 1 capsule by mouth daily.    [provider]  PRALUENT 75 MG/ML SOPN Inject 75 mLs as directed every 14 (fourteen) days. 11/25/16   [provider]  ranolazine (RANEXA) 500 MG 12 hr tablet Take 2 tablets (1,000 mg total) by mouth 2 (two) times daily for 15 days. 01/22/18 02/06/18  Carrie Mew, MD  triamcinolone cream (KENALOG) 0.1 % Apply 1 application topically 2 (two) times daily. As needed; too strong for face, under arms, in the groin 01/22/18   Lada, Satira Anis, MD     Allergies Patient has no known allergies.   Family History  Problem Relation Age of Onset  . Diabetes Mother   . CVA Mother   . Heart disease Father   . Heart disease Sister   . Heart disease Brother   . Heart disease Sister   . Heart disease Sister   . Heart disease Sister   . Heart disease Brother   . Heart disease Brother   . Heart disease Brother     Social History Social History   Tobacco Use  . Smoking status: Former Smoker    Packs/day: 1.00    Years: 30.00    Pack years: 30.00    Types: Cigarettes    Last  attempt to quit: 2006    Years since quitting: 13.7  . Smokeless tobacco: Never Used  . Tobacco comment: smoking cessation materials not required  Substance Use Topics  . Alcohol use: Yes    Frequency: Never    Comment: occasionally  . Drug use: No    Review of Systems  Constitutional:   No fever or chills.  ENT:   No sore throat. No rhinorrhea. Cardiovascular:   No chest pain or syncope. Respiratory:   Positive shortness of breath without cough. Gastrointestinal:   Negative for abdominal pain, vomiting and diarrhea.  Musculoskeletal: Chronic low back pain.  No acute focal pain, positive bilateral ankle swelling All other systems reviewed and are negative except as documented above in ROS and HPI.  ____________________________________________   PHYSICAL EXAM:  VITAL  SIGNS: ED Triage Vitals  Enc Vitals Group     BP 01/22/18 1017 (!) 156/89     Pulse Rate 01/22/18 1017 71     Resp 01/22/18 1017 18     Temp 01/22/18 1017 98.1 F (36.7 C)     Temp Source 01/22/18 1017 Oral     SpO2 01/22/18 1017 99 %     Weight 01/22/18 1018 207 lb 3.2 oz (94 kg)     Height 01/22/18 1018 5\' 6"  (1.676 m)     Head Circumference --      Peak Flow --      Pain Score 01/22/18 1017 5     Pain Loc --      Pain Edu? --      Excl. in [place]? --     Vital signs reviewed, nursing assessments reviewed.   Constitutional:   Alert and oriented. Non-toxic appearance. Eyes:   Conjunctivae are normal. EOMI. PERRL. ENT      Head:   Normocephalic and atraumatic.      Nose:   No congestion/rhinnorhea.       Mouth/Throat:   MMM, no pharyngeal erythema. No peritonsillar mass.       Neck:   No meningismus. Full ROM. Hematological/Lymphatic/Immunilogical:   No cervical lymphadenopathy. Cardiovascular:   RRR. Symmetric bilateral radial and DP pulses.  No murmurs. Cap refill less than 2 seconds. Respiratory:   Normal respiratory effort without tachypnea/retractions. Breath sounds are clear and equal  bilaterally. No wheezes/rales/rhonchi. Gastrointestinal:   Soft and nontender. Non distended. There is no CVA tenderness.  No rebound, rigidity, or guarding.  Musculoskeletal:   Normal range of motion in all extremities. No joint effusions.  No lower extremity tenderness.  No appreciable edema. Neurologic:   Normal speech and language.  Motor grossly intact. No acute focal neurologic deficits are appreciated.  Skin:    Skin is warm, dry and intact. No rash noted.  No petechiae, purpura, or bullae.  ____________________________________________    LABS (pertinent positives/negatives) (all labs ordered are listed, but only abnormal results are displayed) Labs Reviewed  BASIC METABOLIC PANEL - Abnormal; Notable for the following components:      Result Value   Glucose, Bld 101 (*)    Calcium 8.5 (*)    All other components within normal limits  CBC - Abnormal; Notable for the following components:   RBC 3.85 (*)    All other components within normal limits  FIBRIN DERIVATIVES D-DIMER ([facility] ONLY) - Abnormal; Notable for the following components:   Fibrin derivatives D-dimer [facility]) 1,108.93 (*)    All other components within normal limits  TROPONIN I  HEPATIC FUNCTION PANEL  TSH  BRAIN NATRIURETIC PEPTIDE   ____________________________________________   EKG  Interpreted by me  Date: 01/22/2018  Rate: 81  Rhythm: normal sinus rhythm  QRS Axis: normal  Intervals: normal  ST/T Wave abnormalities: normal  Conduction Disutrbances: none  Narrative Interpretation: unremarkable      ____________________________________________    RADIOLOGY  Dg Chest 2 View  Result Date: 01/22/2018 CLINICAL DATA:  C/o SOB and just not feeling well for over 1 month; States she's just tired all the time; CAD, HTN, former smoker EXAM: CHEST - 2 VIEW COMPARISON:  08/25/2017 FINDINGS: Normal mediastinum and cardiac silhouette. Normal pulmonary vasculature. No evidence of effusion, infiltrate,  or pneumothorax. No acute bony abnormality. Remote LEFT rib fractures. Stable sclerotic lesion in the LEFT scapula. IMPRESSION: No acute cardiopulmonary process. Electronically Signed   By: Nicole Kindred  Leonia Reeves M.D.   On: 01/22/2018 10:02   Ct Angio Chest Pe W And/or Wo Contrast  Result Date: 01/22/2018 CLINICAL DATA:  Pt states she has had increased SOB over the past few days with some LE swelling. Hx of CAD, HTN, former smoker. EXAM: CT ANGIOGRAPHY CHEST WITH CONTRAST TECHNIQUE: Multidetector CT imaging of the chest was performed using the standard protocol during bolus administration of intravenous contrast. Multiplanar CT image reconstructions and MIPs were obtained to evaluate the vascular anatomy. CONTRAST:  26mL OMNIPAQUE IOHEXOL 350 MG/ML SOLN COMPARISON:  CTA chest dated 11/04/2016. FINDINGS: Cardiovascular: There is no pulmonary embolism identified within the main, lobar or segmental pulmonary arteries bilaterally. Heart size is within normal limits. No pericardial effusion. Coronary artery calcifications. No thoracic aortic aneurysm or evidence of aortic dissection. Mild aortic atherosclerosis. Mediastinum/Nodes: No mass or enlarged lymph nodes within the mediastinum or perihilar regions. Esophagus appears normal. Trachea and central bronchi are unremarkable. Lungs/Pleura: Small patchy consolidations at each lung base, compatible with mild atelectasis. Lungs otherwise clear. No pleural effusion or pneumothorax seen. Upper Abdomen: No acute abnormality. Musculoskeletal: Degenerative spondylosis within the lower thoracic spine, at least moderate in degree with associated disc space narrowings, vacuum disc phenomenon and osseous spurring. No more than mild central canal stenosis appreciated at any level. No acute or suspicious osseous finding. Review of the MIP images confirms the above findings. IMPRESSION: 1. No acute findings. No pulmonary embolism seen. No evidence of pneumonia or pulmonary edema. 2.  Coronary artery calcifications, particularly dense within the LEFT anterior descending and LEFT circumflex coronary arteries. Recommend correlation with any possible associated cardiac symptoms. Heart size is normal. Aortic Atherosclerosis (ICD10-I70.0). Electronically Signed   By: Franki Cabot M.D.   On: 01/22/2018 13:59    ____________________________________________   PROCEDURES Procedures  ____________________________________________  DIFFERENTIAL DIAGNOSIS   Non-STEMI, pulmonary embolism, pneumonia  CLINICAL IMPRESSION / ASSESSMENT AND PLAN / ED COURSE  Pertinent labs & imaging results that were available during my care of the patient were reviewed by me and considered in my medical decision making (see chart for details).    Patient presents with shortness of breath for the past month, worse with exertion.  Known CAD based on heart catheterization 18 months ago.  Denies any chest pain.  Patient is frustrated about her symptoms that so far have included a firm diagnosis from her perspective.  I will give albuterol as a symptomatic trial   ----------------------------------------- 2:41 PM on 01/22/2018 -----------------------------------------   Initial labs are significant for an elevated d-dimer, otherwise negative.  With her symptoms being persistent over the course of a month, serial troponins are not necessary.  CT angiogram obtained to rule out PE, which was negative and does not show any signs of pleural effusion pulmonary edema or pneumonia.  I will contact her cardiologist for further recommendations at this time.  ----------------------------------------- 3:41 PM on 01/22/2018 -----------------------------------------  Case discussed with Dr. Yancey Flemings who notes that findings and symptoms are similar to when she had a coronary CTA in June 2019.  He recommends increasing her Ranexa to 1000 mg twice daily.  He can follow-up with her on Monday at 9:00 AM, 3 days from now.   The symptoms are chronic, mostly unchanged without acute pulmonary edema or heart failure symptoms, stable for outpatient follow-up.  Doubt ACS or carditis.  Daughter is worried that some of her symptoms could be related to Adderall use.  I recommended she follow-up with Dr. Sanda Klein to continue discussing this.  ____________________________________________   FINAL CLINICAL IMPRESSION(S) / ED DIAGNOSES    Final diagnoses:  SOB (shortness of breath)     ED Discharge Orders         Ordered    ranolazine (RANEXA) 500 MG 12 hr tablet  2 times daily     01/22/18 1541          Portions of this note were generated with dragon dictation software. Dictation errors may occur despite best attempts at proofreading.    Carrie Mew, MD 01/22/18 [phone]

## 2018-01-22 NOTE — Assessment & Plan Note (Signed)
Encouraged patient to work on weight loss; explained calories in vs calories out; refer to nutritionist

## 2018-01-22 NOTE — Discharge Instructions (Addendum)
Your lab tests and CT scan of the chest today were okay. Please increase your Ranexa to 1000mg  two times a day until you see Dr. Humphrey Rolls.

## 2018-01-22 NOTE — ED Triage Notes (Signed)
Pt states she went to her PCP today for a small area of blisters to the left hand and her PCP noticed she was SOB with walking. Pt states she has had increased SOB over the past few days with some LE swelling. Pt is able to speak in complete sentences on arrival states she did get an outpatient chest xray today.

## 2018-01-22 NOTE — Patient Instructions (Addendum)
Please contact Dr. Humphrey Rolls this morning about your shortness of breath Please go to the hospital now for chest xray and labs; stay there until directed to go home or to the ER  Check out the information at familydoctor.org entitled "Nutrition for Weight Loss: What You Need to Know about Fad Diets" Try to lose between 1-2 pounds per week by taking in fewer calories and burning off more calories You can succeed by limiting portions, limiting foods dense in calories and fat, becoming more active, and drinking 8 glasses of water a day (64 ounces) Don't skip meals, especially breakfast, as skipping meals may alter your metabolism Do not use over-the-counter weight loss pills or gimmicks that claim rapid weight loss A healthy BMI (or body mass index) is between 18.5 and 24.9 You can calculate your ideal BMI at the [place] website ClubMonetize.fr   Obesity, Adult Obesity is the condition of having too much total body fat. Being overweight or obese means that your weight is greater than what is considered healthy for your body size. Obesity is determined by a measurement called BMI. BMI is an estimate of body fat and is calculated from height and weight. For adults, a BMI of 30 or higher is considered obese. Obesity can eventually lead to other health concerns and major illnesses, including:  Stroke.  Coronary artery disease (CAD).  Type 2 diabetes.  Some types of cancer, including cancers of the colon, breast, uterus, and gallbladder.  Osteoarthritis.  High blood pressure (hypertension).  High cholesterol.  Sleep apnea.  Gallbladder stones.  Infertility problems.  What are the causes? The main cause of obesity is taking in (consuming) more calories than your body uses for energy. Other factors that contribute to this condition may include:  Being born with genes that make you more likely to become obese.  Having a medical condition that  causes obesity. These conditions include: ? Hypothyroidism. ? Polycystic ovarian syndrome (PCOS). ? Binge-eating disorder. ? Cushing syndrome.  Taking certain medicines, such as steroids, antidepressants, and seizure medicines.  Not being physically active (sedentary lifestyle).  Living where there are limited places to exercise safely or buy healthy foods.  Not getting enough sleep.  What increases the risk? The following factors may increase your risk of this condition:  Having a family history of obesity.  Being a woman of African-American descent.  Being a man of Hispanic descent.  What are the signs or symptoms? Having excessive body fat is the main symptom of this condition. How is this diagnosed? This condition may be diagnosed based on:  Your symptoms.  Your medical history.  A physical exam. Your health care provider may measure: ? Your BMI. If you are an adult with a BMI between 25 and less than 30, you are considered overweight. If you are an adult with a BMI of 30 or higher, you are considered obese. ? The distances around your hips and your waist (circumferences). These may be compared to each other to help diagnose your condition. ? Your skinfold thickness. Your health care provider may gently pinch a fold of your skin and measure it.  How is this treated? Treatment for this condition often includes changing your lifestyle. Treatment may include some or all of the following:  Dietary changes. Work with your health care provider and a dietitian to set a weight-loss goal that is healthy and reasonable for you. Dietary changes may include eating: ? Smaller portions. A portion size is the amount of a particular food  that is healthy for you to eat at one time. This varies from person to person. ? Low-calorie or low-fat options. ? More whole grains, fruits, and vegetables.  Regular physical activity. This may include aerobic activity (cardio) and strength  training.  Medicine to help you lose weight. Your health care provider may prescribe medicine if you are unable to lose 1 pound a week after 6 weeks of eating more healthily and doing more physical activity.  Surgery. Surgical options may include gastric banding and gastric bypass. Surgery may be done if: ? Other treatments have not helped to improve your condition. ? You have a BMI of 40 or higher. ? You have life-threatening health problems related to obesity.  Follow these instructions at home:  Eating and drinking   Follow recommendations from your health care provider about what you eat and drink. Your health care provider may advise you to: ? Limit fast foods, sweets, and processed snack foods. ? Choose low-fat options, such as low-fat milk instead of whole milk. ? Eat 5 or more servings of fruits or vegetables every day. ? Eat at home more often. This gives you more control over what you eat. ? Choose healthy foods when you eat out. ? Learn what a healthy portion size is. ? Keep low-fat snacks on hand. ? Avoid sugary drinks, such as soda, fruit juice, iced tea sweetened with sugar, and flavored milk. ? Eat a healthy breakfast.  Drink enough water to keep your urine clear or pale yellow.  Do not go without eating for long periods of time (do not fast) or follow a fad diet. Fasting and fad diets can be unhealthy and even dangerous. Physical Activity  Exercise regularly, as told by your health care provider. Ask your health care provider what types of exercise are safe for you and how often you should exercise.  Warm up and stretch before being active.  Cool down and stretch after being active.  Rest between periods of activity. Lifestyle  Limit the time that you spend in front of your TV, computer, or video game system.  Find ways to reward yourself that do not involve food.  Limit alcohol intake to no more than 1 drink a day for nonpregnant women and 2 drinks a day  for men. One drink equals 12 oz of beer, 5 oz of wine, or 1 oz of hard liquor. General instructions  Keep a weight loss journal to keep track of the food you eat and how much you exercise you get.  Take over-the-counter and prescription medicines only as told by your health care provider.  Take vitamins and supplements only as told by your health care provider.  Consider joining a support group. Your health care provider may be able to recommend a support group.  Keep all follow-up visits as told by your health care provider. This is important. Contact a health care provider if:  You are unable to meet your weight loss goal after 6 weeks of dietary and lifestyle changes. This information is not intended to replace advice given to you by your health care provider. Make sure you discuss any questions you have with your health care provider. Document Released: 05/08/2004 Document Revised: 09/03/2015 Document Reviewed: 01/17/2015 Elsevier Interactive Patient Education  2018 [place].  Preventing Unhealthy [employer], Adult Staying at a healthy weight is important. When fat builds up in your body, you may become overweight or obese. These conditions put you at greater risk for developing certain health  problems, such as heart disease, diabetes, sleeping problems, joint problems, and some cancers. Unhealthy weight gain is often the result of making unhealthy choices in what you eat. It is also a result of not getting enough exercise. You can make changes to your lifestyle to prevent obesity and stay as healthy as possible. What nutrition changes can be made? To maintain a healthy weight and prevent obesity:  Eat only as much as your body needs. To do this: ? Pay attention to signs that you are hungry or full. Stop eating as soon as you feel full. ? If you feel hungry, try drinking water first. Drink enough water so your urine is clear or pale yellow. ? Eat smaller portions. ? Look at  serving sizes on food labels. Most foods contain more than one serving per container. ? Eat the recommended amount of calories for your gender and activity level. While most active people should eat around 2,000 calories per day, if you are trying to lose weight or are not very active, you main need to eat less calories. Talk to your health care provider or dietitian about how many calories you should eat each day.  Choose healthy foods, such as: ? Fruits and vegetables. Try to fill at least half of your plate at each meal with fruits and vegetables. ? Whole grains, such as whole wheat bread, brown rice, and quinoa. ? Lean meats, such as chicken or fish. ? Other healthy proteins, such as beans, eggs, or tofu. ? Healthy fats, such as nuts, seeds, fatty fish, and olive oil. ? Low-fat or fat-free dairy.  Check food labels and avoid food and drinks that: ? Are high in calories. ? Have added sugar. ? Are high in sodium. ? Have saturated fats or trans fats.  Limit how much you eat of the following foods: ? Prepackaged meals. ? Fast food. ? Fried foods. ? Processed meat, such as bacon, sausage, and deli meats. ? Fatty cuts of red meat and poultry with skin.  Cook foods in healthier ways, such as by baking, broiling, or grilling.  When grocery shopping, try to shop around the outside of the store. This helps you buy mostly fresh foods and avoid canned and prepackaged foods.  What lifestyle changes can be made?  Exercise at least 30 minutes 5 or more days each week. Exercising includes brisk walking, yard work, biking, running, swimming, and team sports like basketball and soccer. Ask your health care provider which exercises are safe for you.  Do not use any products that contain nicotine or tobacco, such as cigarettes and e-cigarettes. If you need help quitting, ask your health care provider.  Limit alcohol intake to no more than 1 drink a day for nonpregnant women and 2 drinks a day for  men. One drink equals 12 oz of beer, 5 oz of wine, or 1 oz of hard liquor.  Try to get 7-9 hours of sleep each night. What other changes can be made?  Keep a food and activity journal to keep track of: ? What you ate and how many calories you had. Remember to count sauces, dressings, and side dishes. ? Whether you were active, and what exercises you did. ? Your calorie, weight, and activity goals.  Check your weight regularly. Track any changes. If you notice you have gained weight, make changes to your diet or activity routine.  Avoid taking weight-loss medicines or supplements. Talk to your health care provider before starting any new medicine or supplement.  Talk to your health care provider before trying any new diet or exercise plan. Why are these changes important? Eating healthy, staying active, and having healthy habits not only help prevent obesity, they also:  Help you to manage stress and emotions.  Help you to connect with friends and family.  Improve your self-esteem.  Improve your sleep.  Prevent long-term health problems.  What can happen if changes are not made? Being obese or overweight can cause you to develop joint or bone problems, which can make it hard for you to stay active or do activities you enjoy. Being obese or overweight also puts stress on your heart and lungs and can lead to health problems like diabetes, heart disease, and some cancers. Where to find more information: Talk with your health care provider or a dietitian about healthy eating and healthy lifestyle choices. You may also find other information through these resources:  U.S. Department of Agriculture MyPlate: FormerBoss.no  American Heart Association: www.heart.org  Centers for Disease Control and Prevention: http://www.wolf.info/  Summary  Staying at a healthy weight is important. It helps prevent certain diseases and health problems, such as heart disease, diabetes, joint problems,  sleep disorders, and some cancers.  Being obese or overweight can cause you to develop joint or bone problems, which can make it hard for you to stay active or do activities you enjoy.  You can prevent unhealthy weight gain by eating a healthy diet, exercising regularly, not smoking, limiting alcohol, and getting enough sleep.  Talk with your health care provider or a dietitian for guidance about healthy eating and healthy lifestyle choices. This information is not intended to replace advice given to you by your health care provider. Make sure you discuss any questions you have with your health care provider. Document Released: 04/01/2016 Document Revised: 05/07/2016 Document Reviewed: 05/07/2016 Elsevier Interactive Patient Education  [employer].

## 2018-01-22 NOTE — Progress Notes (Signed)
BP 128/72   Pulse 97   Temp 98.1 F (36.7 C)   Ht 5\' 6"  (1.676 m)   Wt 207 lb 3.2 oz (94 kg)   SpO2 94%   BMI 33.44 kg/m    Subjective:    Patient ID: Linda Mejia, female    DOB: [birth year], 62 y.o.   MRN: [id number]  HPI: Linda Mejia is a 62 y.o. female  Chief Complaint  Patient presents with  . Rash    onset sunday after working in the garden, left hand, some on right hand  . Headache    onset sunday  . Shortness of Breath    HPI She is here for several things Her left hand breaking out with blisters since Sunday; she was working in the garden; just the left palm; burning and itching; several red places on the legs; the rash under the breast has been going on for a while  GERD; not taking any of the three medicines on her list  Insomnia; used to take Ambien by Dr. Kasandra Knudsen; taking valium for panic attacks; on adderall from Dr. Kasandra Knudsen  Heart doctor has her on praluent and ranolazine; cardiologist is Dr. Neoma Laming; she is [facility]; she has not contacted Dr. Humphrey Rolls; she is not able to lay flat at night, but she has always slept on two pillows; that is not new; she tried to walk about the complex last night, but could only make... I don't know, my distance, can usually go 3 rounds; could only go one block last night; no chest pain; some swelling in the left ankle; she has a little wheeze  She has a headache, started Sunday, like a "damn, sorry, like a band"; tried tylenol and ibuprofen and [facility]  Depression screen [facility] 2/9 01/22/2018 12/15/2017 12/11/2017 06/19/2017 03/18/2017  Decreased Interest 3 0 0 0 0  Down, Depressed, Hopeless 3 0 0 0 0  PHQ - 2 Score 6 0 0 0 0  Altered sleeping 0 0 0 - -  Tired, decreased energy 3 0 0 - -  Change in appetite 0 0 0 - -  Feeling bad or failure about yourself  3 0 0 - -  Trouble concentrating 0 0 0 - -  Moving slowly or fidgety/restless 0 0 0 - -  Suicidal thoughts 0 0 0 - -  PHQ-9 Score 12 0 0 - -  Difficult doing work/chores Very difficult Not difficult  at all Not difficult at all - -   Fall Risk  01/22/2018 12/15/2017 12/11/2017 06/19/2017 03/18/2017  Falls in the past year? No No No No No  Number falls in past yr: - - - - -  Injury with Fall? - - - - -  [place] for fall due to : - Impaired vision;History of fall(s);Medication side effect - - -  Risk for fall due to: Comment - wears eyeglasses; back pain; fell in tub - - -    Relevant past medical, surgical, family and social history reviewed Past Medical History:  Diagnosis Date  . Abnormal ankle brachial index (ABI) 12/02/2016  . ADHD (attention deficit hyperactivity disorder)   . Anxiety   . Aortic atherosclerosis ([place]) 12/02/2016   July 2018  . Chronic back pain   . Coronary artery disease   . Herpes genitalis in women   . History of stomach ulcers    x7 this year. Bleeding  . Hypertension   . Prediabetes 12/02/2016  Past Surgical History:  Procedure Laterality Date  . CARDIAC CATHETERIZATION    . CESAREAN SECTION    . ESOPHAGOGASTRODUODENOSCOPY (EGD) WITH PROPOFOL N/A 09/21/2015   Procedure: ESOPHAGOGASTRODUODENOSCOPY (EGD) WITH PROPOFOL;  Surgeon: Lucilla Lame, MD;  Location: [facility] ENDOSCOPY;  Service: Endoscopy;  Laterality: N/A;  . ESOPHAGOGASTRODUODENOSCOPY (EGD) WITH PROPOFOL N/A 07/30/2016   Procedure: ESOPHAGOGASTRODUODENOSCOPY (EGD) WITH PROPOFOL;  Surgeon: Jonathon Bellows, MD;  Location: [facility] ENDOSCOPY;  Service: Endoscopy;  Laterality: N/A;  . LEFT HEART CATH AND CORONARY ANGIOGRAPHY Right 08/12/2016   Procedure: Left Heart Cath and Coronary Angiography;  Surgeon: Dionisio David, MD;  Location: [place] [facility];  Service: Cardiovascular;  Laterality: Right;  . TONSILLECTOMY    . TUBAL LIGATION     Family History  Problem Relation Age of Onset  . Diabetes Mother   . CVA Mother   . Heart disease Father   . Heart disease Sister   . Heart disease Brother   . Heart disease Sister   . Heart disease Sister   . Heart disease Sister   . Heart disease Brother    . Heart disease Brother   . Heart disease Brother    Social History   Tobacco Use  . Smoking status: Former Smoker    Packs/day: 1.00    Years: 30.00    Pack years: 30.00    Types: Cigarettes    Last attempt to quit: 2006    Years since quitting: 13.7  . Smokeless tobacco: Never Used  . Tobacco comment: smoking cessation materials not required  Substance Use Topics  . Alcohol use: Yes    Frequency: Never    Comment: occasionally  . Drug use: No     [facility] Visit from 01/22/2018 in [facility]  AUDIT-C Score  2      Interim medical history since last visit reviewed. Allergies and medications reviewed  Review of Systems Per HPI unless specifically indicated above     Objective:    BP 128/72   Pulse 97   Temp 98.1 F (36.7 C)   Ht 5\' 6"  (1.676 m)   Wt 207 lb 3.2 oz (94 kg)   SpO2 94%   BMI 33.44 kg/m   Wt Readings from Last 3 Encounters:  01/22/18 207 lb 3.2 oz (94 kg)  12/15/17 210 lb 6.4 oz (95.4 kg)  12/11/17 200 lb (90.7 kg)    Physical Exam  Constitutional: She appears well-developed and well-nourished. No distress.  HENT:  Head: Normocephalic and atraumatic.  Eyes: EOM are normal. No scleral icterus.  Neck: No thyromegaly present.  Cardiovascular: Normal rate, regular rhythm and normal heart sounds.  No murmur heard. Pulmonary/Chest: Effort normal and breath sounds normal. No respiratory distress. She has no wheezes.  Abdominal: Soft. Bowel sounds are normal. She exhibits no distension.  Musculoskeletal: She exhibits edema (nonpitting edema left lower leg; no erythema; no palpable cords; no increased warmth).  Neurological: She is alert.  Skin: Skin is warm and dry. Rash (blistered erythematous lesions in teh left palm; dry erythematous lesions on the thights and trunk) noted. She is not diaphoretic. No pallor.  Psychiatric: She has a normal mood and affect. Her behavior is normal. Judgment and thought content normal.    Results  for orders placed or performed during the hospital encounter of 12/11/17  Comprehensive metabolic panel  Result Value Ref Range   Sodium 137 135 - 145 mmol/L   Potassium 4.0 3.5 - 5.1 mmol/L   Chloride 101  98 - 111 mmol/L   CO2 26 22 - 32 mmol/L   Glucose, Bld 96 70 - 99 mg/dL   BUN 14 8 - 23 mg/dL   Creatinine, Ser 0.77 0.44 - 1.00 mg/dL   Calcium 9.0 8.9 - 10.3 mg/dL   Total Protein 8.2 (H) 6.5 - 8.1 g/dL   Albumin 4.5 3.5 - 5.0 g/dL   AST 25 15 - 41 U/L   ALT 14 0 - 44 U/L   Alkaline Phosphatase 69 38 - 126 U/L   Total Bilirubin 0.6 0.3 - 1.2 mg/dL   GFR calc non Af Amer >60 >60 mL/min   GFR calc Af Amer >60 >60 mL/min   Anion gap 10 5 - 15  CBC  Result Value Ref Range   WBC 7.2 3.6 - 11.0 K/uL   RBC 4.48 3.80 - 5.20 MIL/uL   Hemoglobin 14.5 12.0 - 16.0 g/dL   HCT 42.2 35.0 - 47.0 %   MCV 94.1 80.0 - 100.0 fL   MCH 32.3 26.0 - 34.0 pg   MCHC 34.3 32.0 - 36.0 g/dL   RDW 13.9 11.5 - 14.5 %   Platelets 397 150 - 440 K/uL  Type and screen [place]  Result Value Ref Range   ABO/RH(D) B POS    Antibody Screen NEG    Sample Expiration      12/14/2017 Performed at [facility], [address], [place], [place] [address]       Assessment & Mejia:   Problem List Items Addressed This Visit      Cardiovascular and Mediastinum   Coronary artery disease involving native coronary artery of native heart with angina pectoris (HCC) (Chronic)   Relevant Orders   Troponin I   Hypertension goal BP (blood pressure) < 140/90 (Chronic)    Controlled today      Coronary artery calcification seen on CT scan    Will check trop I as part of work-up, but patient denies chest pain        Other   Obesity    Encouraged patient to work on weight loss; explained calories in vs calories out; refer to nutritionist      Relevant Orders   Amb ref to Medical Nutrition Therapy-MNT    Other Visit Diagnoses    Shortness of breath    -  Primary   pt to  contact her cardiologist this AM; sending her to hospital for stat labs, CXR; to ER over weekend if worse; she agrees; close f/u Monday   Relevant Orders   DG Chest 2 View   D-Dimer, Quantitative   CBC with Differential/Platelet   B Nat Peptide   Comprehensive metabolic panel   TSH   Allergic contact dermatitis due to plants, except food       triamcinolone cream       Follow up Mejia: Return in about 3 days (around 01/25/2018) for follow-up visit with Dr. Sanda Klein or Suezanne Cheshire, DNP.  An after-visit summary was printed and given to the patient at [place].  Please see the patient instructions which may contain other information and recommendations beyond what is mentioned above in the assessment and Mejia.  Meds ordered this encounter  Medications  . triamcinolone cream (KENALOG) 0.1 %    Sig: Apply 1 application topically 2 (two) times daily. As needed; too strong for face, under arms, in the groin    Dispense:  30 g    Refill:  0  Orders Placed This Encounter  Procedures  . DG Chest 2 View  . D-Dimer, Quantitative  . CBC with Differential/Platelet  . B Nat Peptide  . Comprehensive metabolic panel  . TSH  . Troponin I  . Amb ref to Medical Nutrition Therapy-MNT

## 2018-01-22 NOTE — Assessment & Plan Note (Signed)
Controlled today 

## 2018-01-22 NOTE — Assessment & Plan Note (Signed)
Will check trop I as part of work-up, but patient denies chest pain

## 2018-01-23 ENCOUNTER — Inpatient Hospital Stay
Admission: EM | Admit: 2018-01-23 | Discharge: 2018-01-26 | DRG: 286 | Disposition: A | Discharge status: Home / Self Care | Payer: Medicare Other | Attending: Internal Medicine | Admitting: Internal Medicine

## 2018-01-23 ENCOUNTER — Emergency Department: Payer: Medicare Other

## 2018-01-23 ENCOUNTER — Other Ambulatory Visit: Payer: Self-pay

## 2018-01-23 ENCOUNTER — Encounter: Payer: Self-pay | Admitting: Emergency Medicine

## 2018-01-23 DIAGNOSIS — I25110 Atherosclerotic heart disease of native coronary artery with unstable angina pectoris: Principal | ICD-10-CM | POA: Diagnosis present

## 2018-01-23 DIAGNOSIS — I252 Old myocardial infarction: Secondary | ICD-10-CM | POA: Diagnosis not present

## 2018-01-23 DIAGNOSIS — I200 Unstable angina: Secondary | ICD-10-CM | POA: Diagnosis not present

## 2018-01-23 DIAGNOSIS — R0609 Other forms of dyspnea: Secondary | ICD-10-CM | POA: Diagnosis not present

## 2018-01-23 DIAGNOSIS — I5033 Acute on chronic diastolic (congestive) heart failure: Secondary | ICD-10-CM | POA: Diagnosis present

## 2018-01-23 DIAGNOSIS — Z6833 Body mass index (BMI) 33.0-33.9, adult: Secondary | ICD-10-CM

## 2018-01-23 DIAGNOSIS — Z8249 Family history of ischemic heart disease and other diseases of the circulatory system: Secondary | ICD-10-CM | POA: Diagnosis not present

## 2018-01-23 DIAGNOSIS — E669 Obesity, unspecified: Secondary | ICD-10-CM | POA: Diagnosis present

## 2018-01-23 DIAGNOSIS — Z833 Family history of diabetes mellitus: Secondary | ICD-10-CM | POA: Diagnosis not present

## 2018-01-23 DIAGNOSIS — F909 Attention-deficit hyperactivity disorder, unspecified type: Secondary | ICD-10-CM | POA: Diagnosis present

## 2018-01-23 DIAGNOSIS — I110 Hypertensive heart disease with heart failure: Secondary | ICD-10-CM | POA: Diagnosis present

## 2018-01-23 DIAGNOSIS — Z7982 Long term (current) use of aspirin: Secondary | ICD-10-CM

## 2018-01-23 DIAGNOSIS — R0602 Shortness of breath: Secondary | ICD-10-CM

## 2018-01-23 DIAGNOSIS — F17200 Nicotine dependence, unspecified, uncomplicated: Secondary | ICD-10-CM | POA: Diagnosis present

## 2018-01-23 DIAGNOSIS — Z7951 Long term (current) use of inhaled steroids: Secondary | ICD-10-CM | POA: Diagnosis not present

## 2018-01-23 DIAGNOSIS — Z955 Presence of coronary angioplasty implant and graft: Secondary | ICD-10-CM

## 2018-01-23 DIAGNOSIS — Z7952 Long term (current) use of systemic steroids: Secondary | ICD-10-CM

## 2018-01-23 DIAGNOSIS — Z79899 Other long term (current) drug therapy: Secondary | ICD-10-CM | POA: Diagnosis not present

## 2018-01-23 DIAGNOSIS — J449 Chronic obstructive pulmonary disease, unspecified: Secondary | ICD-10-CM | POA: Diagnosis present

## 2018-01-23 LAB — CBC WITH DIFFERENTIAL/PLATELET
Abs Immature Granulocytes: 0.03 10*3/uL (ref 0.00–0.07)
Basophils Absolute: 0 10*3/uL (ref 0.0–0.1)
Basophils Relative: 1 %
Eosinophils Absolute: 0.2 10*3/uL (ref 0.0–0.5)
Eosinophils Relative: 3 %
HCT: 35.6 % — ABNORMAL LOW (ref 36.0–46.0)
Hemoglobin: 11.6 g/dL — ABNORMAL LOW (ref 12.0–15.0)
Immature Granulocytes: 0 %
Lymphocytes Relative: 40 %
Lymphs Abs: 2.7 10*3/uL (ref 0.7–4.0)
MCH: 30.9 pg (ref 26.0–34.0)
MCHC: 32.6 g/dL (ref 30.0–36.0)
MCV: 94.9 fL (ref 80.0–100.0)
Monocytes Absolute: 0.9 10*3/uL (ref 0.1–1.0)
Monocytes Relative: 13 %
Neutro Abs: 2.9 10*3/uL (ref 1.7–7.7)
Neutrophils Relative %: 43 %
Platelets: 327 10*3/uL (ref 150–400)
RBC: 3.75 MIL/uL — ABNORMAL LOW (ref 3.87–5.11)
RDW: 13.4 % (ref 11.5–15.5)
WBC: 6.7 10*3/uL (ref 4.0–10.5)
nRBC: 0 % (ref 0.0–0.2)

## 2018-01-23 LAB — COMPREHENSIVE METABOLIC PANEL
ALT: 14 U/L (ref 0–44)
AST: 25 U/L (ref 15–41)
Albumin: 3.8 g/dL (ref 3.5–5.0)
Alkaline Phosphatase: 65 U/L (ref 38–126)
Anion gap: 11 (ref 5–15)
BUN: 20 mg/dL (ref 8–23)
CO2: 26 mmol/L (ref 22–32)
Calcium: 9.1 mg/dL (ref 8.9–10.3)
Chloride: 103 mmol/L (ref 98–111)
Creatinine, Ser: 0.78 mg/dL (ref 0.44–1.00)
GFR calc Af Amer: >60 mL/min (ref >60)
GFR calc non Af Amer: >60 mL/min (ref >60)
Glucose, Bld: 102 mg/dL — ABNORMAL HIGH (ref 70–99)
Potassium: 4.1 mmol/L (ref 3.5–5.1)
Sodium: 140 mmol/L (ref 135–145)
Total Bilirubin: 0.5 mg/dL (ref 0.3–1.2)
Total Protein: 7 g/dL (ref 6.5–8.1)

## 2018-01-23 LAB — TROPONIN I: Troponin I: <0.03 ng/mL (ref <0.03)

## 2018-01-23 LAB — ETHANOL: Alcohol, Ethyl (B): <10 mg/dL (ref <10)

## 2018-01-23 LAB — APTT: aPTT: 27 seconds (ref 24–36)

## 2018-01-23 LAB — PROTIME-INR
INR: 0.85
Prothrombin Time: 11.5 seconds (ref 11.4–15.2)

## 2018-01-23 LAB — BRAIN NATRIURETIC PEPTIDE: B Natriuretic Peptide: 75 pg/mL (ref 0.0–100.0)

## 2018-01-23 MED ORDER — HEPARIN SODIUM (PORCINE) 5000 UNIT/ML IJ SOLN
5000.0000 [IU] | Freq: Three times a day (TID) | INTRAMUSCULAR | Status: DC
  Administered 2018-01-24 – 2018-01-26 (×6): 5000 [IU] via SUBCUTANEOUS
  Filled 2018-01-23 (×6): qty 1

## 2018-01-23 MED ORDER — DIAZEPAM 5 MG PO TABS: 10.0000 mg | ORAL_TABLET | Freq: Three times a day (TID) | ORAL | Status: DC | PRN

## 2018-01-23 MED ORDER — VITAMIN C 500 MG PO TABS
250.0000 mg | ORAL_TABLET | Freq: Every day | ORAL | Status: DC
  Administered 2018-01-24 – 2018-01-26 (×2): 250 mg via ORAL
  Filled 2018-01-23 (×2): qty 1

## 2018-01-23 MED ORDER — ONDANSETRON HCL 4 MG/2ML IJ SOLN
4.0000 mg | Freq: Four times a day (QID) | INTRAMUSCULAR | Status: DC | PRN
  Administered 2018-01-24: 4 mg via INTRAVENOUS
  Filled 2018-01-23: qty 2

## 2018-01-23 MED ORDER — TRAZODONE HCL 50 MG PO TABS
25.0000 mg | ORAL_TABLET | Freq: Every evening | ORAL | Status: DC | PRN
  Administered 2018-01-24: 25 mg via ORAL
  Filled 2018-01-23: qty 1

## 2018-01-23 MED ORDER — VITAMIN D3 25 MCG (1000 UNIT) PO TABS
1000.0000 [IU] | ORAL_TABLET | Freq: Every day | ORAL | Status: DC
  Administered 2018-01-24 – 2018-01-26 (×2): 1000 [IU] via ORAL
  Filled 2018-01-23 (×3): qty 1

## 2018-01-23 MED ORDER — DOCUSATE SODIUM 100 MG PO CAPS
100.0000 mg | ORAL_CAPSULE | Freq: Two times a day (BID) | ORAL | Status: DC
  Administered 2018-01-24 – 2018-01-26 (×4): 100 mg via ORAL
  Filled 2018-01-23 (×4): qty 1

## 2018-01-23 MED ORDER — AMPHETAMINE-DEXTROAMPHETAMINE 30 MG PO TABS: 30.0000 mg | ORAL_TABLET | Freq: Two times a day (BID) | ORAL | Status: DC

## 2018-01-23 MED ORDER — SODIUM CHLORIDE 0.9 % IV SOLN
Freq: Once | INTRAVENOUS | Status: AC
  Administered 2018-01-24: 02:00:00 via INTRAVENOUS

## 2018-01-23 MED ORDER — ACYCLOVIR 200 MG PO CAPS
400.0000 mg | ORAL_CAPSULE | Freq: Two times a day (BID) | ORAL | Status: DC
  Administered 2018-01-24 – 2018-01-26 (×4): 400 mg via ORAL
  Filled 2018-01-23 (×7): qty 2

## 2018-01-23 MED ORDER — METOPROLOL TARTRATE 50 MG PO TABS
50.0000 mg | ORAL_TABLET | Freq: Two times a day (BID) | ORAL | Status: DC
  Administered 2018-01-24 – 2018-01-25 (×4): 50 mg via ORAL
  Filled 2018-01-23 (×5): qty 1

## 2018-01-23 MED ORDER — FLUTICASONE PROPIONATE 50 MCG/ACT NA SUSP
2.0000 | NASAL | Status: DC | PRN
  Filled 2018-01-23: qty 16

## 2018-01-23 MED ORDER — BISACODYL 5 MG PO TBEC: 5.0000 mg | DELAYED_RELEASE_TABLET | Freq: Every day | ORAL | Status: DC | PRN

## 2018-01-23 MED ORDER — AMPHETAMINE-DEXTROAMPHETAMINE 20 MG PO TABS
30.0000 mg | ORAL_TABLET | Freq: Two times a day (BID) | ORAL | Status: DC
  Filled 2018-01-23 (×2): qty 1

## 2018-01-23 MED ORDER — HYDROCODONE-ACETAMINOPHEN 5-325 MG PO TABS
1.0000 | ORAL_TABLET | ORAL | Status: DC | PRN
  Administered 2018-01-24 (×3): 2 via ORAL
  Administered 2018-01-25: 1 via ORAL
  Filled 2018-01-23: qty 2
  Filled 2018-01-23: qty 1
  Filled 2018-01-23 (×2): qty 2

## 2018-01-23 MED ORDER — OXYCODONE-ACETAMINOPHEN 5-325 MG PO TABS
1.0000 | ORAL_TABLET | Freq: Once | ORAL | Status: AC
  Administered 2018-01-23: 1 via ORAL
  Filled 2018-01-23: qty 1

## 2018-01-23 MED ORDER — ACETAMINOPHEN 650 MG RE SUPP: 650.0000 mg | Freq: Four times a day (QID) | RECTAL | Status: DC | PRN

## 2018-01-23 MED ORDER — ACETAMINOPHEN 325 MG PO TABS: 650.0000 mg | ORAL_TABLET | Freq: Four times a day (QID) | ORAL | Status: DC | PRN

## 2018-01-23 MED ORDER — ASPIRIN 81 MG PO CHEW
324.0000 mg | CHEWABLE_TABLET | Freq: Once | ORAL | Status: AC
  Administered 2018-01-23: 324 mg via ORAL
  Filled 2018-01-23: qty 4

## 2018-01-23 MED ORDER — ASPIRIN EC 81 MG PO TBEC
81.0000 mg | DELAYED_RELEASE_TABLET | Freq: Every day | ORAL | Status: DC
  Administered 2018-01-24: 81 mg via ORAL
  Filled 2018-01-23: qty 1

## 2018-01-23 MED ORDER — ONDANSETRON HCL 4 MG PO TABS: 4.0000 mg | ORAL_TABLET | Freq: Four times a day (QID) | ORAL | Status: DC | PRN

## 2018-01-23 MED ORDER — RANOLAZINE ER 500 MG PO TB12
1000.0000 mg | ORAL_TABLET | Freq: Two times a day (BID) | ORAL | Status: DC
  Administered 2018-01-24 – 2018-01-26 (×4): 1000 mg via ORAL
  Filled 2018-01-23 (×7): qty 2

## 2018-01-23 NOTE — ED Provider Notes (Signed)
[facility] Emergency Department Provider Note  ____________________________________________   I have reviewed the triage vital signs and the nursing notes. Where available I have reviewed prior notes and, if possible and indicated, outside hospital notes.    HISTORY  Chief Complaint Shortness of Breath    HPI Linda Mejia is a 62 y.o. female with a history of ACS who states she does have 2 stents placed, has had several months of worsening exertional dyspnea worse again over the last couple weeks.  Was seen here yesterday had a CT scan which did not show a PE fortunately but did show coronary calcifications in the LAD.  Patient has had episodes of diaphoresis and feeling exertional dyspnea.  She is also had some nausea but not today.  She has had no chest pain although sometimes she has a sensation in her arm which is familiar to her from prior ACS.  She is not having any discomfort at this time.  She called because she was getting more short of breath when she walks around.  She is also had decreased energy generally speaking.  She has had some slight lower extremity swelling but nothing significant.  Eyes any rectal bleeding.  She does take a baby aspirin every day.    Past Medical History:  Diagnosis Date  . Abnormal ankle brachial index (ABI) 12/02/2016  . ADHD (attention deficit hyperactivity disorder)   . Anxiety   . Aortic atherosclerosis ([place]) 12/02/2016   July 2018  . Chronic back pain   . Coronary artery disease   . Herpes genitalis in women   . History of stomach ulcers    x7 this year. Bleeding  . Hypertension   . Prediabetes 12/02/2016    Patient Active Problem List   Diagnosis Date Noted  . Medication monitoring encounter 03/18/2017  . Colon cancer screening 03/18/2017  . Coronary artery calcification seen on CT scan 12/02/2016  . Aortic atherosclerosis ([place]) 12/02/2016  . Abnormal ankle brachial index (ABI) 12/02/2016  . Prediabetes  12/02/2016  . Fracture of rib 08/28/2016  . Chest pain 08/11/2016  . Hx of tobacco use, presenting hazards to health 06/09/2016  . Degenerative arthritis of knee, bilateral 12/27/2015  . Obesity 12/27/2015  . GI bleed 09/21/2015  . Reflux esophagitis   . Other diseases of stomach and duodenum   . Multiple joint pain 06/05/2015  . Night sweats 06/05/2015  . Headache 04/12/2015  . Subclinical hypothyroidism 03/26/2015  . Herpes simplex type 2 infection 03/21/2015  . Rhinitis, allergic 03/21/2015  . Cervical pain 09/21/2014  . ADHD (attention deficit hyperactivity disorder), inattentive type 09/20/2014  . Episodic paroxysmal anxiety disorder 09/20/2014  . History of gastric ulcer 09/20/2014  . Abuse, drug or alcohol ([place]) 09/20/2014  . Substance abuse ([place]) 09/20/2014  . HLD (hyperlipidemia) 10/03/2009  . Coronary artery disease involving native coronary artery of native heart with angina pectoris ([place]) 10/02/2009  . Hypertension goal BP (blood pressure) < 140/90 10/02/2009    Past Surgical History:  Procedure Laterality Date  . CARDIAC CATHETERIZATION    . CESAREAN SECTION    . ESOPHAGOGASTRODUODENOSCOPY (EGD) WITH PROPOFOL N/A 09/21/2015   Procedure: ESOPHAGOGASTRODUODENOSCOPY (EGD) WITH PROPOFOL;  Surgeon: Lucilla Lame, MD;  Location: [facility] ENDOSCOPY;  Service: Endoscopy;  Laterality: N/A;  . ESOPHAGOGASTRODUODENOSCOPY (EGD) WITH PROPOFOL N/A 07/30/2016   Procedure: ESOPHAGOGASTRODUODENOSCOPY (EGD) WITH PROPOFOL;  Surgeon: Jonathon Bellows, MD;  Location: [facility] ENDOSCOPY;  Service: Endoscopy;  Laterality: N/A;  . LEFT HEART CATH AND CORONARY ANGIOGRAPHY  Right 08/12/2016   Procedure: Left Heart Cath and Coronary Angiography;  Surgeon: Dionisio David, MD;  Location: [facility];  Service: Cardiovascular;  Laterality: Right;  . TONSILLECTOMY    . TUBAL LIGATION      Prior to Admission medications   Medication Sig Start Date End Date Taking? Authorizing Provider  acyclovir (ZOVIRAX)  400 MG tablet take 1 tablet by mouth twice a day 11/13/17   Arnetha Courser, MD  amphetamine-dextroamphetamine (ADDERALL) 30 MG tablet Take 20 mg by mouth 2 (two) times daily.  12/02/16   Arnetha Courser, MD  Ascorbic Acid (VITAMIN C) 100 MG tablet Take 100 mg by mouth daily.    [provider]  aspirin EC 81 MG tablet Take 81 mg by mouth daily.    [provider]  cholecalciferol (VITAMIN D) 1000 units tablet Take 1,000 Units by mouth daily.    [provider]  diazepam (VALIUM) 10 MG tablet Take 1 tablet (10 mg total) by mouth every 8 (eight) hours as needed for anxiety. 09/22/15   Fritzi Mandes, MD  fluticasone (FLONASE) 50 MCG/ACT nasal spray Place 2 sprays into both nostrils as needed. 09/22/17   Arnetha Courser, MD  metoprolol tartrate (LOPRESSOR) 50 MG tablet Take 1 tablet (50 mg total) by mouth 2 (two) times daily. 06/10/17   Arnetha Courser, MD  Omega-3 Fatty Acids (FISH OIL) 1000 MG CAPS Take 1 capsule by mouth daily.    [provider]  PRALUENT 75 MG/ML SOPN Inject 75 mLs as directed every 14 (fourteen) days. 11/25/16   [provider]  ranolazine (RANEXA) 500 MG 12 hr tablet Take 2 tablets (1,000 mg total) by mouth 2 (two) times daily for 15 days. 01/22/18 02/06/18  Carrie Mew, MD  triamcinolone cream (KENALOG) 0.1 % Apply 1 application topically 2 (two) times daily. As needed; too strong for face, under arms, in the groin 01/22/18   Lada, Satira Anis, MD    Allergies Patient has no known allergies.  Family History  Problem Relation Age of Onset  . Diabetes Mother   . CVA Mother   . Heart disease Father   . Heart disease Sister   . Heart disease Brother   . Heart disease Sister   . Heart disease Sister   . Heart disease Sister   . Heart disease Brother   . Heart disease Brother   . Heart disease Brother     Social History Social History   Tobacco Use  . Smoking status: Former Smoker    Packs/day: 1.00    Years: 30.00    Pack  years: 30.00    Types: Cigarettes    Last attempt to quit: 2006    Years since quitting: 13.7  . Smokeless tobacco: Never Used  . Tobacco comment: smoking cessation materials not required  Substance Use Topics  . Alcohol use: Yes    Frequency: Never    Comment: occasionally  . Drug use: No    Review of Systems Constitutional: No fever/chills Eyes: No visual changes. ENT: No sore throat. No stiff neck no neck pain Cardiovascular: Denies chest pain. Respiratory: + Exertional shortness of breath. Gastrointestinal:   no vomiting.  No diarrhea.  No constipation. Genitourinary: Negative for dysuria. Musculoskeletal: Negative lower extremity swelling Skin: Negative for rash. Neurological: Negative for severe headaches, focal weakness or numbness.   ____________________________________________   PHYSICAL EXAM:  VITAL SIGNS: ED Triage Vitals  Enc Vitals Group  BP 01/23/18 1901 (!) 188/108     Pulse Rate 01/23/18 1901 (!) 102     Resp 01/23/18 1901 12     Temp 01/23/18 1901 98.1 F (36.7 C)     Temp Source 01/23/18 1901 Oral     SpO2 01/23/18 1901 97 %     Weight 01/23/18 1904 207 lb 0.2 oz (93.9 kg)     Height 01/23/18 1904 5\' 6"  (1.676 m)     Head Circumference --      Peak Flow --      Pain Score 01/23/18 1903 8     Pain Loc --      Pain Edu? --      Excl. in [place]? --     Constitutional: Alert and oriented. Well appearing and in no acute distress.  Somewhat anxious Eyes: Conjunctivae are normal Head: Atraumatic HEENT: No congestion/rhinnorhea. Mucous membranes are moist.  Oropharynx non-erythematous Neck:   Nontender with no meningismus, no masses, no stridor Cardiovascular: Normal rate, regular rhythm. Grossly normal heart sounds.  Good peripheral circulation. Respiratory: Normal respiratory effort.  No retractions. Lungs CTAB. Abdominal: Soft and nontender. No distention. No guarding no rebound Back:  There is no focal tenderness or step off.  there is no  midline tenderness there are no lesions noted. there is no CVA tenderness Musculoskeletal: No lower extremity tenderness, no upper extremity tenderness. No joint effusions, no DVT signs strong distal pulses no edema Neurologic:  Normal speech and language. No gross focal neurologic deficits are appreciated.  Skin:  Skin is warm, dry and intact. No rash noted. Psychiatric: Mood and affect are normal. Speech and behavior are normal.  ____________________________________________   LABS (all labs ordered are listed, but only abnormal results are displayed)  Labs Reviewed  COMPREHENSIVE METABOLIC PANEL  PROTIME-INR  ETHANOL  TROPONIN I  BRAIN NATRIURETIC PEPTIDE  APTT    Pertinent labs  results that were available during my care of the patient were reviewed by me and considered in my medical decision making (see chart for details). ____________________________________________  EKG  I personally interpreted any EKGs ordered by me or triage Sinus tachycardia rate 102 no acute ST elevation or depression, normal axis, unremarkable EKG aside from mild tachycardia ____________________________________________  RADIOLOGY  Pertinent labs & imaging results that were available during my care of the patient were reviewed by me and considered in my medical decision making (see chart for details). If possible, patient and/or family made aware of any abnormal findings.  No results found. ____________________________________________    PROCEDURES  Procedure(s) performed: None  Procedures  Critical Care performed: None  ____________________________________________   INITIAL IMPRESSION / ASSESSMENT AND PLAN / ED COURSE  Pertinent labs & imaging results that were available during my care of the patient were reviewed by me and considered in my medical decision making (see chart for details).  Patient here with progressive dyspnea on exertion similar to prior ACS.  Has been getting worse  for several months but she states over the last several days is been getting much worse she was seen here yesterday discharged after a negative PE study but she feels that she cannot continue at home because she is too dyspneic and she gets diaphoretic and has had tingling in her left arm.  Patient does have a history of 2 stents placed in the past she states.  Time she had a heart catheter was 2018, May, at which time she had LAD stent, which was okay, and 30% LAD  lesion approximately 40% mid LAD lesion, 40% mid circumflex lesion, and 100% proximal RCA ostia lesion which was treated medically because of good collaterals from LAD.  Given patient's history of extensive coronary artery disease, prior stents, exertional dyspnea, and repeat visits to the emergency room for same, with a CT scan which was specifically started as having dense LAD calcifications, I think it is it in the patient's best interest to be admitted.  IVC no significant talk about PE given negative test yesterday.  Will check BNP to ensure that she does not have a subtle CHF, and we will talk to the hospital doctors after work-up is here.  Patient denies symptoms at this moment at rest    ____________________________________________   FINAL CLINICAL IMPRESSION(S) / ED DIAGNOSES  Final diagnoses:  SOB (shortness of breath)      This chart was dictated using voice recognition software.  Despite best efforts to proofread,  errors can occur which can change meaning.      Schuyler Amor, MD 01/23/18 [phone]

## 2018-01-23 NOTE — H&P (Signed)
[facility] NAME: Linda Mejia    MR#:  [id number]  DATE OF BIRTH:  [birth year]  DATE OF ADMISSION:  01/23/2018  PRIMARY CARE PHYSICIAN: Arnetha Courser, MD   REQUESTING/REFERRING PHYSICIAN:   CHIEF COMPLAINT:   Chief Complaint  Patient presents with  . Shortness of Breath    HISTORY OF PRESENT ILLNESS: Linda Mejia  is a 62 y.o. female with a known history of CAD, status post 2 stents placement in the past. Patient presents to emergency room for exertional dyspnea going on for the past 2 to 3 weeks, gradually getting worse.  In the past 24 hours, patient has had nausea and diaphoresis associated to the shortness of breath.  She does not have chest pain but she does complain of pain in her left arm during the dyspnea episodes.  Her symptoms improved with nitroglycerin.  Per patient, the symptoms are very similar to her prior ACS episode.  She is compliant with her medications. No reports of fever, chills, vomiting, diarrhea, bleeding. Blood test done emergency room, including CBC and CMP are grossly unremarkable.  Troponin level is less than 0.03. EKG shows sinus tachycardia with heart rate at 102, no acute ischemic changes. CTA of the chest is negative for PE, but shows coronary artery calcifications, particularly dense within the LEFT anterior descending and LEFT circumflex coronary arteries.   Off note, most recent cardiac cath report from May 2018 showed:  Prox LAD lesion, 30 %stenosed.  Mid LAD lesion, 40 %stenosed.  Prox Cx to Mid Cx lesion, 40 %stenosed.  Ost RCA to Prox RCA lesion, 100 %stenosed.  CTO of small RCA with good collaterals from LAD, treat medically. Stent in LAD ok and normal LVEF and wall motion.  Patient is admitted for further evaluation and treatment.   PAST MEDICAL HISTORY:   Past Medical History:  Diagnosis Date  . Abnormal ankle brachial index (ABI) 12/02/2016  . ADHD (attention deficit  hyperactivity disorder)   . Anxiety   . Aortic atherosclerosis ([place]) 12/02/2016   July 2018  . Chronic back pain   . Coronary artery disease   . Herpes genitalis in women   . History of stomach ulcers    x7 this year. Bleeding  . Hypertension   . Prediabetes 12/02/2016    PAST SURGICAL HISTORY:  Past Surgical History:  Procedure Laterality Date  . CARDIAC CATHETERIZATION    . CESAREAN SECTION    . ESOPHAGOGASTRODUODENOSCOPY (EGD) WITH PROPOFOL N/A 09/21/2015   Procedure: ESOPHAGOGASTRODUODENOSCOPY (EGD) WITH PROPOFOL;  Surgeon: Lucilla Lame, MD;  Location: [facility] END[facility];  Service: Endoscopy;  Laterality: N/A;  . ESOPHAGOGASTRODUODENOSCOPY (EGD) WITH PROPOFOL N/A 07/30/2016   Procedure: ESOPHAGOGASTRODUODENOSCOPY (EGD) WITH PROPOFOL;  Surgeon: Jonathon Bellows, MD;  Location: [facility] ENDOSCOPY;  Service: Endoscopy;  Laterality: N/A;  . LEFT HEART CATH AND CORONARY ANGIOGRAPHY Right 08/12/2016   Procedure: Left Heart Cath and Coronary Angiography;  Surgeon: Dionisio David, MD;  Location: [facility];  Service: Cardiovascular;  Laterality: Right;  . TONSILLECTOMY    . TUBAL LIGATION      SOCIAL HISTORY:  Social History   Tobacco Use  . Smoking status: Former Smoker    Packs/day: 1.00    Years: 30.00    Pack years: 30.00    Types: Cigarettes    Last attempt to quit: 2006    Years since quitting: 13.7  . Smokeless tobacco: Never Used  . Tobacco comment: smoking cessation materials  not required  Substance Use Topics  . Alcohol use: Yes    Frequency: Never    Comment: occasionally    FAMILY HISTORY:  Family History  Problem Relation Age of Onset  . Diabetes Mother   . CVA Mother   . Heart disease Father   . Heart disease Sister   . Heart disease Brother   . Heart disease Sister   . Heart disease Sister   . Heart disease Sister   . Heart disease Brother   . Heart disease Brother   . Heart disease Brother     DRUG ALLERGIES: No Known Allergies  REVIEW OF SYSTEMS:    CONSTITUTIONAL: No fever, fatigue or weakness.  EYES: No changes in vision.  EARS, NOSE, AND THROAT: No tinnitus or ear pain.  RESPIRATORY: Positive for shortness of breath with exertion.  No cough, wheezing or hemoptysis.  CARDIOVASCULAR: No chest pain, orthopnea, edema.  GASTROINTESTINAL: Positive for nausea, no vomiting, diarrhea or abdominal pain.  GENITOURINARY: No dysuria, hematuria.  ENDOCRINE: No polyuria, nocturia. HEMATOLOGY: No bleeding. SKIN: No rash or lesion. MUSCULOSKELETAL: Positive for left arm pain during dyspnea episodes.   NEUROLOGIC: No focal weakness.  PSYCHIATRY: No anxiety or depression.   MEDICATIONS AT HOME:  Prior to Admission medications   Medication Sig Start Date End Date Taking? Authorizing Provider  acyclovir (ZOVIRAX) 400 MG tablet take 1 tablet by mouth twice a day 11/13/17  Yes Lada, Satira Anis, MD  amphetamine-dextroamphetamine (ADDERALL) 30 MG tablet Take 30 mg by mouth 2 (two) times daily.  12/02/16  Yes Lada, Satira Anis, MD  Ascorbic Acid (VITAMIN C) 100 MG tablet Take 100 mg by mouth daily.   Yes [provider]  aspirin EC 81 MG tablet Take 81 mg by mouth daily.   Yes [provider]  cholecalciferol (VITAMIN D) 1000 units tablet Take 1,000 Units by mouth daily.   Yes [provider]  diazepam (VALIUM) 10 MG tablet Take 1 tablet (10 mg total) by mouth every 8 (eight) hours as needed for anxiety. 09/22/15  Yes Fritzi Mandes, MD  fluticasone (FLONASE) 50 MCG/ACT nasal spray Place 2 sprays into both nostrils as needed. 09/22/17  Yes Lada, Satira Anis, MD  metoprolol tartrate (LOPRESSOR) 50 MG tablet Take 1 tablet (50 mg total) by mouth 2 (two) times daily. 06/10/17  Yes Lada, Satira Anis, MD  Omega-3 Fatty Acids (FISH OIL) 1000 MG CAPS Take 1 capsule by mouth daily.   Yes [provider]  PRALUENT 75 MG/ML SOPN Inject 75 mLs as directed every 14 (fourteen) days. 11/25/16  Yes [provider]  ranolazine (RANEXA) 500 MG  12 hr tablet Take 2 tablets (1,000 mg total) by mouth 2 (two) times daily for 15 days. 01/22/18 02/06/18 Yes Carrie Mew, MD  triamcinolone cream (KENALOG) 0.1 % Apply 1 application topically 2 (two) times daily. As needed; too strong for face, under arms, in the groin 01/22/18  Yes Lada, Satira Anis, MD      PHYSICAL EXAMINATION:   VITAL SIGNS: Blood pressure (!) 176/92, pulse 86, temperature 98.1 F (36.7 C), temperature source Oral, resp. rate (!) 21, height 5\' 6"  (1.676 m), weight 93.9 kg, SpO2 99 %.  GENERAL:  62 y.o.-year-old patient lying in the bed with no acute distress.  EYES: Pupils equal, round, reactive to light and accommodation. No scleral icterus. Extraocular muscles intact.  HEENT: Head atraumatic, normocephalic. Oropharynx and nasopharynx clear.  NECK:  Supple, no jugular venous distention. No thyroid enlargement, no  tenderness.  LUNGS: Normal breath sounds bilaterally, no wheezing, rales,rhonchi or crepitation. No use of accessory muscles of respiration.  CARDIOVASCULAR: S1, S2 normal. No S3/S4.  ABDOMEN: Soft, nontender, nondistended. Bowel sounds present. No organomegaly or mass.  EXTREMITIES: No pedal edema, cyanosis, or clubbing.  NEUROLOGIC: Cranial nerves II through XII are intact. Muscle strength 5/5 in all extremities. Sensation intact.   PSYCHIATRIC: The patient is alert and oriented x 3.  SKIN: No obvious rash, lesion, or ulcer.   LABORATORY PANEL:   CBC Recent Labs  Lab 01/22/18 1108 01/23/18 1931  WBC 6.2 6.7  HGB 12.0 11.6*  HCT 36.4 35.6*  PLT 311 327  MCV 94.5 94.9  MCH 31.2 30.9  MCHC 33.0 32.6  RDW 13.3 13.4  LYMPHSABS  --  2.7  MONOABS  --  0.9  EOSABS  --  0.2  BASOSABS  --  0.0   ------------------------------------------------------------------------------------------------------------------  Chemistries  Recent Labs  Lab 01/22/18 1108 01/23/18 1931  NA 138 140  K 4.2 4.1  CL 98 103  CO2 28 26  GLUCOSE 101* 102*  BUN 14  20  CREATININE 0.76 0.78  CALCIUM 8.5* 9.1  AST 25 25  ALT 12 14  ALKPHOS 67 65  BILITOT 0.5 0.5   ------------------------------------------------------------------------------------------------------------------ estimated creatinine clearance is 85.2 mL/min (by C-G formula based on SCr of 0.78 mg/dL). ------------------------------------------------------------------------------------------------------------------ Recent Labs    01/22/18 1108  TSH 1.971     Coagulation profile Recent Labs  Lab 01/23/18 1931  INR 0.85   ------------------------------------------------------------------------------------------------------------------- No results for input(s): DDIMER in the last 72 hours. -------------------------------------------------------------------------------------------------------------------  Cardiac Enzymes Recent Labs  Lab 01/22/18 1108 01/23/18 1931  TROPONINI <0.03 <0.03   ------------------------------------------------------------------------------------------------------------------ Invalid input(s): POCBNP  ---------------------------------------------------------------------------------------------------------------  Urinalysis    Component Value Date/Time   COLORURINE YELLOW (A) 11/22/2017 0744   APPEARANCEUR CLEAR (A) 11/22/2017 0744   APPEARANCEUR Clear 08/03/2014 2125   LABSPEC 1.019 11/22/2017 0744   LABSPEC 1.006 08/03/2014 2125   PHURINE 5.0 11/22/2017 0744   GLUCOSEU NEGATIVE 11/22/2017 0744   GLUCOSEU Negative 08/03/2014 2125   HGBUR NEGATIVE 11/22/2017 0744   BILIRUBINUR NEGATIVE 11/22/2017 0744   BILIRUBINUR negative 01/28/2017 1054   BILIRUBINUR Negative 08/03/2014 2125   KETONESUR 5 (A) 11/22/2017 0744   PROTEINUR NEGATIVE 11/22/2017 0744   UROBILINOGEN negative (A) 01/28/2017 1054   NITRITE NEGATIVE 11/22/2017 0744   LEUKOCYTESUR NEGATIVE 11/22/2017 0744   LEUKOCYTESUR Negative 08/03/2014 2125     RADIOLOGY: Dg Chest 2  View  Result Date: 01/23/2018 CLINICAL DATA:  Shortness of breath for 2 days. EXAM: CHEST - 2 VIEW COMPARISON:  01/22/2018 FINDINGS: The heart size and mediastinal contours are within normal limits. Aortic atherosclerosis. Both lungs are clear. Old left rib fracture deformities again noted. IMPRESSION: No active cardiopulmonary disease. Electronically Signed   By: Earle Gell M.D.   On: 01/23/2018 19:49   Dg Chest 2 View  Result Date: 01/22/2018 CLINICAL DATA:  C/o SOB and just not feeling well for over 1 month; States she's just tired all the time; CAD, HTN, former smoker EXAM: CHEST - 2 VIEW COMPARISON:  08/25/2017 FINDINGS: Normal mediastinum and cardiac silhouette. Normal pulmonary vasculature. No evidence of effusion, infiltrate, or pneumothorax. No acute bony abnormality. Remote LEFT rib fractures. Stable sclerotic lesion in the LEFT scapula. IMPRESSION: No acute cardiopulmonary process. Electronically Signed   By: Suzy Bouchard M.D.   On: 01/22/2018 10:02   Ct Angio Chest Pe W And/or Wo Contrast  Result Date: 01/22/2018 CLINICAL DATA:  Pt states she has had increased SOB over the past few days with some LE swelling. Hx of CAD, HTN, former smoker. EXAM: CT ANGIOGRAPHY CHEST WITH CONTRAST TECHNIQUE: Multidetector CT imaging of the chest was performed using the standard protocol during bolus administration of intravenous contrast. Multiplanar CT image reconstructions and MIPs were obtained to evaluate the vascular anatomy. CONTRAST:  47mL OMNIPAQUE IOHEXOL 350 MG/ML SOLN COMPARISON:  CTA chest dated 11/04/2016. FINDINGS: Cardiovascular: There is no pulmonary embolism identified within the main, lobar or segmental pulmonary arteries bilaterally. Heart size is within normal limits. No pericardial effusion. Coronary artery calcifications. No thoracic aortic aneurysm or evidence of aortic dissection. Mild aortic atherosclerosis. Mediastinum/Nodes: No mass or enlarged lymph nodes within the  mediastinum or perihilar regions. Esophagus appears normal. Trachea and central bronchi are unremarkable. Lungs/Pleura: Small patchy consolidations at each lung base, compatible with mild atelectasis. Lungs otherwise clear. No pleural effusion or pneumothorax seen. Upper Abdomen: No acute abnormality. Musculoskeletal: Degenerative spondylosis within the lower thoracic spine, at least moderate in degree with associated disc space narrowings, vacuum disc phenomenon and osseous spurring. No more than mild central canal stenosis appreciated at any level. No acute or suspicious osseous finding. Review of the MIP images confirms the above findings. IMPRESSION: 1. No acute findings. No pulmonary embolism seen. No evidence of pneumonia or pulmonary edema. 2. Coronary artery calcifications, particularly dense within the LEFT anterior descending and LEFT circumflex coronary arteries. Recommend correlation with any possible associated cardiac symptoms. Heart size is normal. Aortic Atherosclerosis (ICD10-I70.0). Electronically Signed   By: Franki Cabot M.D.   On: 01/22/2018 13:59    EKG: Orders placed or performed during the hospital encounter of 01/23/18  . EKG 12-Lead  . EKG 12-Lead  . ED EKG  . ED EKG    IMPRESSION AND PLAN:  1.  Unstable angina, in this patient with known coronary artery disease.  Continue to monitor patient closely on telemetry and follow troponin levels.  We will check 2D echo for further evaluation of cardiac function.  Continue CAD home medications.  Cardiology is consulted for further input. 2.  ADHD.  Had a long discussion with the patient about Adderall use.  She would like to consult her psychiatrist for possible Adderall replacement with another medication. 3.  Hypertension, stable continue home medications.  All the records are reviewed and case discussed with ED provider. Management plans discussed with the patient, family and they are in agreement.  CODE STATUS: Full Code  Status History    Date Active Date Inactive Code Status Order ID Comments User Context   08/11/2016 1909 08/12/2016 1840 Full Code [id number]  [name], MD Inpatient   07/29/2016 1533 07/30/2016 2117 Full Code [id number]  [name], MD ED   09/21/2015 0229 09/22/2015 1418 Full Code [id number]  Saundra Shelling, MD Inpatient       TOTAL TIME TAKING CARE OF THIS PATIENT: 50 minutes.    [name] M.D on 01/23/2018 at 11:05 PM  Between 7am to 6pm - Pager - [phone]  After 6pm go to [email or web address] - password [email or web address] [facility] [place] [facility]  [phone]  CC: Primary care physician; Arnetha Courser, MD

## 2018-01-23 NOTE — ED Notes (Signed)
Call back to floor

## 2018-01-23 NOTE — ED Triage Notes (Signed)
Pt to ED via EMS from home c/o SOB x1 week getting worse today.  Pt seen at PCP yesterday and ED with bloodwork and CT done.  PT speaking in complete and coherent sentences, chest rise even and unlabored, also states neck and head pain for a couple days.  States dyspnea with exertion.

## 2018-01-23 NOTE — ED Notes (Signed)
Call to 2A, RN to call back for report

## 2018-01-24 ENCOUNTER — Encounter: Payer: Self-pay | Admitting: Internal Medicine

## 2018-01-24 ENCOUNTER — Other Ambulatory Visit: Payer: Self-pay | Admitting: Cardiovascular Disease

## 2018-01-24 ENCOUNTER — Inpatient Hospital Stay
Admit: 2018-01-24 | Discharge: 2018-01-24 | Disposition: A | Discharge status: Home / Self Care | Payer: Medicare Other | Attending: Internal Medicine | Admitting: Internal Medicine

## 2018-01-24 LAB — CBC
HCT: 34.6 % — ABNORMAL LOW (ref 36.0–46.0)
Hemoglobin: 11.1 g/dL — ABNORMAL LOW (ref 12.0–15.0)
MCH: 30.8 pg (ref 26.0–34.0)
MCHC: 32.1 g/dL (ref 30.0–36.0)
MCV: 96.1 fL (ref 80.0–100.0)
Platelets: 312 10*3/uL (ref 150–400)
RBC: 3.6 MIL/uL — ABNORMAL LOW (ref 3.87–5.11)
RDW: 13.2 % (ref 11.5–15.5)
WBC: 6 10*3/uL (ref 4.0–10.5)
nRBC: 0 % (ref 0.0–0.2)

## 2018-01-24 LAB — LIPID PANEL
Cholesterol: 247 mg/dL — ABNORMAL HIGH (ref 0–200)
HDL: 56 mg/dL (ref >40)
LDL Cholesterol: 162 mg/dL — ABNORMAL HIGH (ref 0–99)
Total CHOL/HDL Ratio: 4.4 RATIO
Triglycerides: 145 mg/dL (ref <150)
VLDL: 29 mg/dL (ref 0–40)

## 2018-01-24 LAB — BASIC METABOLIC PANEL
Anion gap: 8 (ref 5–15)
BUN: 20 mg/dL (ref 8–23)
CO2: 29 mmol/L (ref 22–32)
Calcium: 8.4 mg/dL — ABNORMAL LOW (ref 8.9–10.3)
Chloride: 102 mmol/L (ref 98–111)
Creatinine, Ser: 0.8 mg/dL (ref 0.44–1.00)
GFR calc Af Amer: >60 mL/min (ref >60)
GFR calc non Af Amer: >60 mL/min (ref >60)
Glucose, Bld: 100 mg/dL — ABNORMAL HIGH (ref 70–99)
Potassium: 3.8 mmol/L (ref 3.5–5.1)
Sodium: 139 mmol/L (ref 135–145)

## 2018-01-24 LAB — HEMOGLOBIN A1C
Hgb A1c MFr Bld: 5.8 % — ABNORMAL HIGH (ref 4.8–5.6)
Mean Plasma Glucose: 119.76 mg/dL

## 2018-01-24 LAB — TROPONIN I
Troponin I: <0.03 ng/mL (ref <0.03)
Troponin I: <0.03 ng/mL (ref <0.03)
Troponin I: <0.03 ng/mL (ref <0.03)

## 2018-01-24 MED ORDER — AMPHETAMINE-DEXTROAMPHETAMINE 5 MG PO TABS
30.0000 mg | ORAL_TABLET | Freq: Two times a day (BID) | ORAL | Status: DC
  Administered 2018-01-26: 30 mg via ORAL
  Filled 2018-01-24: qty 6

## 2018-01-24 MED ORDER — ASPIRIN EC 81 MG PO TBEC
81.0000 mg | DELAYED_RELEASE_TABLET | Freq: Every day | ORAL | Status: DC
  Administered 2018-01-26: 81 mg via ORAL
  Filled 2018-01-24: qty 1

## 2018-01-24 MED ORDER — SODIUM CHLORIDE 0.9 % IV SOLN: 250.0000 mL | INTRAVENOUS | Status: DC | PRN

## 2018-01-24 MED ORDER — SODIUM CHLORIDE 0.9 % WEIGHT BASED INFUSION
3.0000 mL/kg/h | INTRAVENOUS | Status: AC
  Administered 2018-01-25: 3 mL/kg/h via INTRAVENOUS

## 2018-01-24 MED ORDER — SODIUM CHLORIDE 0.9% FLUSH
3.0000 mL | Freq: Two times a day (BID) | INTRAVENOUS | Status: DC
  Administered 2018-01-24: 3 mL via INTRAVENOUS

## 2018-01-24 MED ORDER — SODIUM CHLORIDE 0.9% FLUSH: 3.0000 mL | INTRAVENOUS | Status: DC | PRN

## 2018-01-24 MED ORDER — ASPIRIN 81 MG PO CHEW
81.0000 mg | CHEWABLE_TABLET | ORAL | Status: AC
  Administered 2018-01-25 (×2): 81 mg via ORAL
  Filled 2018-01-24: qty 1

## 2018-01-24 MED ORDER — ROSUVASTATIN CALCIUM 10 MG PO TABS
10.0000 mg | ORAL_TABLET | Freq: Every day | ORAL | Status: DC
  Administered 2018-01-24 – 2018-01-26 (×2): 10 mg via ORAL
  Filled 2018-01-24 (×2): qty 1

## 2018-01-24 MED ORDER — SODIUM CHLORIDE 0.9 % WEIGHT BASED INFUSION: 1.0000 mL/kg/h | INTRAVENOUS | Status: DC

## 2018-01-24 NOTE — Plan of Care (Signed)
  Problem: Education: Goal: Knowledge of General Education information will improve Description: Including pain rating scale, medication(s)/side effects and non-pharmacologic comfort measures Outcome: Progressing   Problem: Clinical Measurements: Goal: Cardiovascular complication will be avoided Outcome: Progressing   Problem: Pain Managment: Goal: General experience of comfort will improve Outcome: Progressing   

## 2018-01-24 NOTE — Progress Notes (Signed)
[facility] [place] NAME: Linda Mejia    MR#:  [id number]  DATE OF BIRTH:  [birth year]  SUBJECTIVE:  Here with chest pain no CP overnight   REVIEW OF SYSTEMS:    Review of Systems  Constitutional: Negative for fever, chills weight loss HENT: Negative for ear pain, nosebleeds, congestion, facial swelling, rhinorrhea, neck pain, neck stiffness and ear discharge.   Respiratory: Negative for cough, shortness of breath, wheezing  Cardiovascular: Negative for chest pain, palpitations and leg swelling.  Gastrointestinal: Negative for heartburn, abdominal pain, vomiting, diarrhea or consitpation Genitourinary: Negative for dysuria, urgency, frequency, hematuria Musculoskeletal: Negative for back pain or joint pain Neurological: Negative for dizziness, seizures, syncope, focal weakness,  numbness and headaches.  Hematological: Does not bruise/bleed easily.  Psychiatric/Behavioral: Negative for hallucinations, confusion, dysphoric mood    Tolerating Diet: yes      DRUG ALLERGIES:  No Known Allergies  VITALS:  Blood pressure 130/67, pulse 79, temperature 98 F (36.7 C), temperature source Oral, resp. rate 18, height 5\' 6"  (1.676 m), weight 94.5 kg, SpO2 95 %.  PHYSICAL EXAMINATION:  Constitutional: Appears well-developed and well-nourished. No distress. HENT: Normocephalic. Marland Kitchen Oropharynx is clear and moist.  Eyes: Conjunctivae and EOM are normal. PERRLA, no scleral icterus.  Neck: Normal ROM. Neck supple. No JVD. No tracheal deviation. CVS: RRR, S1/S2 +, no murmurs, no gallops, no carotid bruit.  Pulmonary: Effort and breath sounds normal, no stridor, rhonchi, wheezes, rales.  Abdominal: Soft. BS +,  no distension, tenderness, rebound or guarding.  Musculoskeletal: Normal range of motion. No edema and no tenderness.  Neuro: Alert. CN 2-12 grossly intact. No focal deficits. Skin: Skin is warm and dry. No rash noted. Psychiatric: Normal mood  and affect.      LABORATORY PANEL:   CBC Recent Labs  Lab 01/24/18 0514  WBC 6.0  HGB 11.1*  HCT 34.6*  PLT 312   ------------------------------------------------------------------------------------------------------------------  Chemistries  Recent Labs  Lab 01/23/18 1931 01/24/18 0514  NA 140 139  K 4.1 3.8  CL 103 102  CO2 26 29  GLUCOSE 102* 100*  BUN 20 20  CREATININE 0.78 0.80  CALCIUM 9.1 8.4*  AST 25  --   ALT 14  --   ALKPHOS 65  --   BILITOT 0.5  --    ------------------------------------------------------------------------------------------------------------------  Cardiac Enzymes Recent Labs  Lab 01/22/18 1108 01/23/18 1931  TROPONINI <0.03 <0.03   ------------------------------------------------------------------------------------------------------------------  RADIOLOGY:  Dg Chest 2 View  Result Date: 01/23/2018 CLINICAL DATA:  Shortness of breath for 2 days. EXAM: CHEST - 2 VIEW COMPARISON:  01/22/2018 FINDINGS: The heart size and mediastinal contours are within normal limits. Aortic atherosclerosis. Both lungs are clear. Old left rib fracture deformities again noted. IMPRESSION: No active cardiopulmonary disease. Electronically Signed   By: Earle Gell M.D.   On: 01/23/2018 19:49   Ct Angio Chest Pe W And/or Wo Contrast  Result Date: 01/22/2018 CLINICAL DATA:  Pt states she has had increased SOB over the past few days with some LE swelling. Hx of CAD, HTN, former smoker. EXAM: CT ANGIOGRAPHY CHEST WITH CONTRAST TECHNIQUE: Multidetector CT imaging of the chest was performed using the standard protocol during bolus administration of intravenous contrast. Multiplanar CT image reconstructions and MIPs were obtained to evaluate the vascular anatomy. CONTRAST:  30mL OMNIPAQUE IOHEXOL 350 MG/ML SOLN COMPARISON:  CTA chest dated 11/04/2016. FINDINGS: Cardiovascular: There is no pulmonary embolism identified within the main, lobar or segmental pulmonary  arteries bilaterally. Heart size is within normal limits. No pericardial effusion. Coronary artery calcifications. No thoracic aortic aneurysm or evidence of aortic dissection. Mild aortic atherosclerosis. Mediastinum/Nodes: No mass or enlarged lymph nodes within the mediastinum or perihilar regions. Esophagus appears normal. Trachea and central bronchi are unremarkable. Lungs/Pleura: Small patchy consolidations at each lung base, compatible with mild atelectasis. Lungs otherwise clear. No pleural effusion or pneumothorax seen. Upper Abdomen: No acute abnormality. Musculoskeletal: Degenerative spondylosis within the lower thoracic spine, at least moderate in degree with associated disc space narrowings, vacuum disc phenomenon and osseous spurring. No more than mild central canal stenosis appreciated at any level. No acute or suspicious osseous finding. Review of the MIP images confirms the above findings. IMPRESSION: 1. No acute findings. No pulmonary embolism seen. No evidence of pneumonia or pulmonary edema. 2. Coronary artery calcifications, particularly dense within the LEFT anterior descending and LEFT circumflex coronary arteries. Recommend correlation with any possible associated cardiac symptoms. Heart size is normal. Aortic Atherosclerosis (ICD10-I70.0). Electronically Signed   By: Franki Cabot M.D.   On: 01/22/2018 13:59     ASSESSMENT AND PLAN:   62 year old female with history of CAD/stent who presents with chest pain.  1.  Chest pain/known CAD/stent: Continue aspirin, Ranexa, metoprolol Add statin Check lipid panel and A1c Further management after cardiology evaluation D/w dr Humphrey Rolls  2.  ADHD: Continue Adderall  3.  Essential hypertension: Continue metoprolol Management plans discussed with the patient and she is in agreement.  CODE STATUS: full  TOTAL TIME TAKING CARE OF THIS PATIENT: 30 minutes.     POSSIBLE D/C tomorrow, DEPENDING ON CLINICAL CONDITION.   Cyndra Feinberg M.D  on 01/24/2018 at 9:51 AM  Between 7am to 6pm - Pager - [phone] After 6pm go to [email or web address] - password [email or web address] [facility]  Office  [phone]  CC: Primary care physician; Arnetha Courser, MD  Note: This dictation was prepared with Dragon dictation along with smaller phrase technology. Any transcriptional errors that result from this process are unintentional.

## 2018-01-24 NOTE — Consult Note (Signed)
Linda Mejia is a 62 y.o. female  [id number]  Primary Cardiologist: Dr. Neoma Laming Reason for Consultation: Exertional dyspnea (anginal equivalent) and history of extensive coronary artery disease  HPI: 62yo female with a history of CAD with 2 stents placed, HTN, and prediabetes presented to ER with worsening exertional dyspnea, diaphoresis, nausea. She reports these symptoms were similar to her previous ACS. She denies chest pain.   Review of Systems: Feels pain in her back and shoulder with shortness of breath. She states this is the same symptoms she had with her last heart attack.    Past Medical History:  Diagnosis Date  . Abnormal ankle brachial index (ABI) 12/02/2016  . ADHD (attention deficit hyperactivity disorder)   . Anxiety   . Aortic atherosclerosis ([place]) 12/02/2016   July 2018  . Chronic back pain   . Coronary artery disease   . Herpes genitalis in women   . History of stomach ulcers    x7 this year. Bleeding  . Hypertension   . Prediabetes 12/02/2016    Medications Prior to Admission  Medication Sig Dispense Refill  . acyclovir (ZOVIRAX) 400 MG tablet take 1 tablet by mouth twice a day 60 tablet 5  . amphetamine-dextroamphetamine (ADDERALL) 30 MG tablet Take 30 mg by mouth 2 (two) times daily.     . Ascorbic Acid (VITAMIN C) 100 MG tablet Take 100 mg by mouth daily.    Marland Kitchen aspirin EC 81 MG tablet Take 81 mg by mouth daily.    . cholecalciferol (VITAMIN D) 1000 units tablet Take 1,000 Units by mouth daily.    . diazepam (VALIUM) 10 MG tablet Take 1 tablet (10 mg total) by mouth every 8 (eight) hours as needed for anxiety. 30 tablet 0  . fluticasone (FLONASE) 50 MCG/ACT nasal spray Place 2 sprays into both nostrils as needed. 16 g 11  . metoprolol tartrate (LOPRESSOR) 50 MG tablet Take 1 tablet (50 mg total) by mouth 2 (two) times daily. 60 tablet 5  . Omega-3 Fatty Acids (FISH OIL) 1000 MG CAPS Take 1 capsule by mouth daily.    Marland Kitchen PRALUENT 75 MG/ML SOPN Inject 75  mLs as directed every 14 (fourteen) days.    . ranolazine (RANEXA) 500 MG 12 hr tablet Take 2 tablets (1,000 mg total) by mouth 2 (two) times daily for 15 days. 30 tablet 0  . triamcinolone cream (KENALOG) 0.1 % Apply 1 application topically 2 (two) times daily. As needed; too strong for face, under arms, in the groin 30 g 0     . acyclovir  400 mg Oral BID  . amphetamine-dextroamphetamine  30 mg Oral BID WC  . aspirin EC  81 mg Oral Daily  . cholecalciferol  1,000 Units Oral Daily  . docusate sodium  100 mg Oral BID  . heparin  5,000 Units Subcutaneous Q8H  . metoprolol tartrate  50 mg Oral BID  . ranolazine  1,000 mg Oral BID  . vitamin C  250 mg Oral Daily    Infusions:   No Known Allergies  Social History   Socioeconomic History  . Marital status: Divorced    Spouse name: Not on file  . Number of children: 2  . Years of education: Not on file  . Highest education level: GED or equivalent  Occupational History  . Occupation: disabled  Social Needs  . Financial resource strain: Not hard at all  . Food insecurity:    Worry: Never true  Inability: Never true  . Transportation needs:    Medical: No    Non-medical: No  Tobacco Use  . Smoking status: Former Smoker    Packs/day: 1.00    Years: 30.00    Pack years: 30.00    Types: Cigarettes    Last attempt to quit: 2006    Years since quitting: 13.7  . Smokeless tobacco: Never Used  . Tobacco comment: smoking cessation materials not required  Substance and Sexual Activity  . Alcohol use: Yes    Alcohol/week: 1.0 standard drinks    Types: 1 Glasses of wine per week    Frequency: Never    Comment: occasionally  . Drug use: No  . Sexual activity: Not Currently    Birth control/protection: None, Post-menopausal  Lifestyle  . Physical activity:    Days per week: 0 days    Minutes per session: 0 min  . Stress: Not at all  Relationships  . Social connections:    Talks on phone: Patient refused    Gets  together: Patient refused    Attends religious service: Patient refused    Active member of club or organization: Patient refused    Attends meetings of clubs or organizations: Patient refused    Relationship status: Divorced  . Intimate partner violence:    Fear of current or ex partner: No    Emotionally abused: No    Physically abused: No    Forced sexual activity: No  Other Topics Concern  . Not on file  Social History Narrative  . Not on file    Family History  Problem Relation Age of Onset  . Diabetes Mother   . CVA Mother   . Heart disease Father   . Heart disease Sister   . Heart disease Brother   . Heart disease Sister   . Heart disease Sister   . Heart disease Sister   . Heart disease Brother   . Heart disease Brother   . Heart disease Brother     PHYSICAL EXAM: Vitals:   01/23/18 2317 01/24/18 0421  BP: (!) 181/91 130/67  Pulse: 93 79  Resp: 16 18  Temp: 97.9 F (36.6 C) 98 F (36.7 C)  SpO2: 99% 95%     Intake/Output Summary (Last 24 hours) at 01/24/2018 0756 Last data filed at 01/24/2018 0416 Gross per 24 hour  Intake 131.73 ml  Output 100 ml  Net 31.73 ml    General:  Appears slightly agitated, mildly increased work of breathing HEENT: normal Neck: supple. no JVD. Carotids 2+ bilat; no bruits. No lymphadenopathy or thryomegaly appreciated. Cor: PMI nondisplaced. Regular rate & rhythm. No rubs, gallops or murmurs. Lungs: clear Abdomen: soft, nontender, nondistended. No hepatosplenomegaly. No bruits or masses. Good bowel sounds. Extremities: no cyanosis, clubbing, rash, edema Neuro: alert & oriented x 3, cranial nerves grossly intact. moves all 4 extremities w/o difficulty. Affect pleasant.  ECG: sinus tachycardia 102bpm  Results for orders placed or performed during the hospital encounter of 01/23/18 (from the past 24 hour(s))  Comprehensive metabolic panel     Status: Abnormal   Collection Time: 01/23/18  7:31 PM  Result Value Ref Range    Sodium 140 135 - 145 mmol/L   Potassium 4.1 3.5 - 5.1 mmol/L   Chloride 103 98 - 111 mmol/L   CO2 26 22 - 32 mmol/L   Glucose, Bld 102 (H) 70 - 99 mg/dL   BUN 20 8 - 23 mg/dL   Creatinine, Ser 0.78  0.44 - 1.00 mg/dL   Calcium 9.1 8.9 - 10.3 mg/dL   Total Protein 7.0 6.5 - 8.1 g/dL   Albumin 3.8 3.5 - 5.0 g/dL   AST 25 15 - 41 U/L   ALT 14 0 - 44 U/L   Alkaline Phosphatase 65 38 - 126 U/L   Total Bilirubin 0.5 0.3 - 1.2 mg/dL   GFR calc non Af Amer >60 >60 mL/min   GFR calc Af Amer >60 >60 mL/min   Anion gap 11 5 - 15  Protime-INR     Status: None   Collection Time: 01/23/18  7:31 PM  Result Value Ref Range   Prothrombin Time 11.5 11.4 - 15.2 seconds   INR 0.85   Ethanol     Status: None   Collection Time: 01/23/18  7:31 PM  Result Value Ref Range   Alcohol, Ethyl (B) <10 <10 mg/dL  Troponin I     Status: None   Collection Time: 01/23/18  7:31 PM  Result Value Ref Range   Troponin I <0.03 <0.03 ng/mL  Brain natriuretic peptide     Status: None   Collection Time: 01/23/18  7:31 PM  Result Value Ref Range   B Natriuretic Peptide 75.0 0.0 - 100.0 pg/mL  APTT     Status: None   Collection Time: 01/23/18  7:31 PM  Result Value Ref Range   aPTT 27 24 - 36 seconds  CBC with Differential     Status: Abnormal   Collection Time: 01/23/18  7:31 PM  Result Value Ref Range   WBC 6.7 4.0 - 10.5 K/uL   RBC 3.75 (L) 3.87 - 5.11 MIL/uL   Hemoglobin 11.6 (L) 12.0 - 15.0 g/dL   HCT 35.6 (L) 36.0 - 46.0 %   MCV 94.9 80.0 - 100.0 fL   MCH 30.9 26.0 - 34.0 pg   MCHC 32.6 30.0 - 36.0 g/dL   RDW 13.4 11.5 - 15.5 %   Platelets 327 150 - 400 K/uL   nRBC 0.0 0.0 - 0.2 %   Neutrophils Relative % 43 %   Neutro Abs 2.9 1.7 - 7.7 K/uL   Lymphocytes Relative 40 %   Lymphs Abs 2.7 0.7 - 4.0 K/uL   Monocytes Relative 13 %   Monocytes Absolute 0.9 0.1 - 1.0 K/uL   Eosinophils Relative 3 %   Eosinophils Absolute 0.2 0.0 - 0.5 K/uL   Basophils Relative 1 %   Basophils Absolute 0.0 0.0 - 0.1  K/uL   Immature Granulocytes 0 %   Abs Immature Granulocytes 0.03 0.00 - 0.07 K/uL  Basic metabolic panel     Status: Abnormal   Collection Time: 01/24/18  5:14 AM  Result Value Ref Range   Sodium 139 135 - 145 mmol/L   Potassium 3.8 3.5 - 5.1 mmol/L   Chloride 102 98 - 111 mmol/L   CO2 29 22 - 32 mmol/L   Glucose, Bld 100 (H) 70 - 99 mg/dL   BUN 20 8 - 23 mg/dL   Creatinine, Ser 0.80 0.44 - 1.00 mg/dL   Calcium 8.4 (L) 8.9 - 10.3 mg/dL   GFR calc non Af Amer >60 >60 mL/min   GFR calc Af Amer >60 >60 mL/min   Anion gap 8 5 - 15  CBC     Status: Abnormal   Collection Time: 01/24/18  5:14 AM  Result Value Ref Range   WBC 6.0 4.0 - 10.5 K/uL   RBC 3.60 (L) 3.87 - 5.11 MIL/uL  Hemoglobin 11.1 (L) 12.0 - 15.0 g/dL   HCT 34.6 (L) 36.0 - 46.0 %   MCV 96.1 80.0 - 100.0 fL   MCH 30.8 26.0 - 34.0 pg   MCHC 32.1 30.0 - 36.0 g/dL   RDW 13.2 11.5 - 15.5 %   Platelets 312 150 - 400 K/uL   nRBC 0.0 0.0 - 0.2 %   Dg Chest 2 View  Result Date: 01/23/2018 CLINICAL DATA:  Shortness of breath for 2 days. EXAM: CHEST - 2 VIEW COMPARISON:  01/22/2018 FINDINGS: The heart size and mediastinal contours are within normal limits. Aortic atherosclerosis. Both lungs are clear. Old left rib fracture deformities again noted. IMPRESSION: No active cardiopulmonary disease. Electronically Signed   By: Earle Gell M.D.   On: 01/23/2018 19:49   Dg Chest 2 View  Result Date: 01/22/2018 CLINICAL DATA:  C/o SOB and just not feeling well for over 1 month; States she's just tired all the time; CAD, HTN, former smoker EXAM: CHEST - 2 VIEW COMPARISON:  08/25/2017 FINDINGS: Normal mediastinum and cardiac silhouette. Normal pulmonary vasculature. No evidence of effusion, infiltrate, or pneumothorax. No acute bony abnormality. Remote LEFT rib fractures. Stable sclerotic lesion in the LEFT scapula. IMPRESSION: No acute cardiopulmonary process. Electronically Signed   By: Suzy Bouchard M.D.   On: 01/22/2018 10:02   Ct  Angio Chest Pe W And/or Wo Contrast  Result Date: 01/22/2018 CLINICAL DATA:  Pt states she has had increased SOB over the past few days with some LE swelling. Hx of CAD, HTN, former smoker. EXAM: CT ANGIOGRAPHY CHEST WITH CONTRAST TECHNIQUE: Multidetector CT imaging of the chest was performed using the standard protocol during bolus administration of intravenous contrast. Multiplanar CT image reconstructions and MIPs were obtained to evaluate the vascular anatomy. CONTRAST:  40mL OMNIPAQUE IOHEXOL 350 MG/ML SOLN COMPARISON:  CTA chest dated 11/04/2016. FINDINGS: Cardiovascular: There is no pulmonary embolism identified within the main, lobar or segmental pulmonary arteries bilaterally. Heart size is within normal limits. No pericardial effusion. Coronary artery calcifications. No thoracic aortic aneurysm or evidence of aortic dissection. Mild aortic atherosclerosis. Mediastinum/Nodes: No mass or enlarged lymph nodes within the mediastinum or perihilar regions. Esophagus appears normal. Trachea and central bronchi are unremarkable. Lungs/Pleura: Small patchy consolidations at each lung base, compatible with mild atelectasis. Lungs otherwise clear. No pleural effusion or pneumothorax seen. Upper Abdomen: No acute abnormality. Musculoskeletal: Degenerative spondylosis within the lower thoracic spine, at least moderate in degree with associated disc space narrowings, vacuum disc phenomenon and osseous spurring. No more than mild central canal stenosis appreciated at any level. No acute or suspicious osseous finding. Review of the MIP images confirms the above findings. IMPRESSION: 1. No acute findings. No pulmonary embolism seen. No evidence of pneumonia or pulmonary edema. 2. Coronary artery calcifications, particularly dense within the LEFT anterior descending and LEFT circumflex coronary arteries. Recommend correlation with any possible associated cardiac symptoms. Heart size is normal. Aortic Atherosclerosis  (ICD10-I70.0). Electronically Signed   By: Franki Cabot M.D.   On: 01/22/2018 13:59     ASSESSMENT AND PLAN: Extensive history of coronary artery disease with pt presenting with worsening exertional dyspnea and "the same symptoms she had with her last heart attack". Troponin negative thus far, Echo pending. Hemodynamically stable. Continue Ranexa, will plan for cardiac cath tomorrow.   Prior cath 08/12/16: Prox LAD lesion, 30 %stenosed. Mid LAD lesion, 40 %stenosed. Prox Cx to Mid Cx lesion, 40 %stenosed. Ost RCA to Prox RCA lesion, 100 %stenosed. CTO of  small RCA with good collaterals from LAD, treat medically. Stent in LAD ok and normal LVEF and wall motion.  Jake Bathe, NP-C Cell: [phone]

## 2018-01-25 ENCOUNTER — Encounter
Admission: EM | Disposition: A | Discharge status: Home / Self Care | Payer: Self-pay | Source: Home / Self Care | Attending: Internal Medicine

## 2018-01-25 ENCOUNTER — Ambulatory Visit: Payer: Medicare Other | Admitting: Nurse Practitioner

## 2018-01-25 DIAGNOSIS — I200 Unstable angina: Secondary | ICD-10-CM

## 2018-01-25 DIAGNOSIS — R0609 Other forms of dyspnea: Secondary | ICD-10-CM

## 2018-01-25 DIAGNOSIS — J449 Chronic obstructive pulmonary disease, unspecified: Secondary | ICD-10-CM

## 2018-01-25 HISTORY — PX: LEFT HEART CATH: CATH118248

## 2018-01-25 LAB — ECHOCARDIOGRAM COMPLETE
Height: 66 in
Weight: 3332.8 oz

## 2018-01-25 LAB — GLUCOSE, CAPILLARY
Glucose-Capillary: 110 mg/dL — ABNORMAL HIGH (ref 70–99)
Glucose-Capillary: 88 mg/dL (ref 70–99)

## 2018-01-25 SURGERY — LEFT HEART CATH
Anesthesia: Moderate Sedation | Laterality: Right

## 2018-01-25 MED ORDER — IPRATROPIUM-ALBUTEROL 0.5-2.5 (3) MG/3ML IN SOLN: 3.0000 mL | RESPIRATORY_TRACT | Status: DC | PRN

## 2018-01-25 MED ORDER — SODIUM CHLORIDE 0.9% FLUSH: 3.0000 mL | INTRAVENOUS | Status: DC | PRN

## 2018-01-25 MED ORDER — ACETAMINOPHEN 325 MG PO TABS: 650.0000 mg | ORAL_TABLET | ORAL | Status: DC | PRN

## 2018-01-25 MED ORDER — FENTANYL CITRATE (PF) 100 MCG/2ML IJ SOLN
INTRAMUSCULAR | Status: AC
  Filled 2018-01-25: qty 2

## 2018-01-25 MED ORDER — IPRATROPIUM-ALBUTEROL 0.5-2.5 (3) MG/3ML IN SOLN
3.0000 mL | RESPIRATORY_TRACT | Status: DC
  Filled 2018-01-25: qty 3

## 2018-01-25 MED ORDER — HEPARIN (PORCINE) IN NACL 1000-0.9 UT/500ML-% IV SOLN
INTRAVENOUS | Status: AC
  Filled 2018-01-25: qty 1000

## 2018-01-25 MED ORDER — ASPIRIN 81 MG PO CHEW
CHEWABLE_TABLET | ORAL | Status: AC
  Administered 2018-01-25: 81 mg via ORAL
  Filled 2018-01-25: qty 1

## 2018-01-25 MED ORDER — MIDAZOLAM HCL 2 MG/2ML IJ SOLN
INTRAMUSCULAR | Status: AC
  Filled 2018-01-25: qty 2

## 2018-01-25 MED ORDER — FUROSEMIDE 10 MG/ML IJ SOLN
INTRAMUSCULAR | Status: AC
  Administered 2018-01-25: 09:00:00
  Filled 2018-01-25: qty 4

## 2018-01-25 MED ORDER — FLUTICASONE FUROATE-VILANTEROL 200-25 MCG/INH IN AEPB
1.0000 | INHALATION_SPRAY | Freq: Every day | RESPIRATORY_TRACT | Status: DC
  Administered 2018-01-25 – 2018-01-26 (×2): 1 via RESPIRATORY_TRACT
  Filled 2018-01-25: qty 28

## 2018-01-25 MED ORDER — MIDAZOLAM HCL 2 MG/2ML IJ SOLN
INTRAMUSCULAR | Status: DC | PRN
  Administered 2018-01-25: 1 mg via INTRAVENOUS

## 2018-01-25 MED ORDER — SODIUM CHLORIDE 0.9 % WEIGHT BASED INFUSION
1.0000 mL/kg/h | INTRAVENOUS | Status: AC
  Administered 2018-01-25 (×2): 1 mL/kg/h via INTRAVENOUS

## 2018-01-25 MED ORDER — METHYLPREDNISOLONE SODIUM SUCC 125 MG IJ SOLR
60.0000 mg | INTRAMUSCULAR | Status: DC
  Administered 2018-01-25: 60 mg via INTRAVENOUS
  Filled 2018-01-25: qty 2

## 2018-01-25 MED ORDER — SODIUM CHLORIDE 0.9 % IV SOLN: 250.0000 mL | INTRAVENOUS | Status: DC | PRN

## 2018-01-25 MED ORDER — FUROSEMIDE 10 MG/ML IJ SOLN
40.0000 mg | Freq: Once | INTRAMUSCULAR | Status: AC
  Administered 2018-01-25: 40 mg via INTRAVENOUS

## 2018-01-25 MED ORDER — FENTANYL CITRATE (PF) 100 MCG/2ML IJ SOLN
INTRAMUSCULAR | Status: DC | PRN
  Administered 2018-01-25: 25 ug via INTRAVENOUS

## 2018-01-25 MED ORDER — ONDANSETRON HCL 4 MG/2ML IJ SOLN: 4.0000 mg | Freq: Four times a day (QID) | INTRAMUSCULAR | Status: DC | PRN

## 2018-01-25 MED ORDER — SODIUM CHLORIDE 0.9% FLUSH
3.0000 mL | Freq: Two times a day (BID) | INTRAVENOUS | Status: DC
  Administered 2018-01-25: 3 mL via INTRAVENOUS

## 2018-01-25 MED ORDER — IOPAMIDOL (ISOVUE-300) INJECTION 61%
INTRAVENOUS | Status: DC | PRN
  Administered 2018-01-25: 110 mL via INTRA_ARTERIAL

## 2018-01-25 SURGICAL SUPPLY — 11 items
CATH INFINITI 5 FR 3DRC (CATHETERS) ×1 IMPLANT
CATH INFINITI 5FR ANG PIGTAIL (CATHETERS) ×1 IMPLANT
CATH INFINITI 5FR JL4 (CATHETERS) ×1 IMPLANT
CATH INFINITI JR4 5F (CATHETERS) ×1 IMPLANT
DEVICE CLOSURE MYNXGRIP 5F (Vascular Products) ×1 IMPLANT
KIT MANI 3VAL PERCEP (MISCELLANEOUS) ×2 IMPLANT
NDL PERC 18GX7CM (NEEDLE) IMPLANT
NEEDLE PERC 18GX7CM (NEEDLE) ×2 IMPLANT
PACK CARDIAC CATH (CUSTOM PROCEDURE TRAY) ×2 IMPLANT
SHEATH AVANTI 5FR X 11CM (SHEATH) ×2 IMPLANT
WIRE GUIDERIGHT .035X150 (WIRE) ×1 IMPLANT

## 2018-01-25 NOTE — Progress Notes (Signed)
SUBJECTIVE: Feels very SOB   Vitals:   01/25/18 0851 01/25/18 1030 01/25/18 1059 01/25/18 1100  BP:  105/81 112/70   Pulse: 89 89 85 88  Resp: 12 13 16 20   Temp: 98.4 F (36.9 C)     TempSrc: Oral     SpO2: 93% 90% 94% 91%  Weight:      Height:        Intake/Output Summary (Last 24 hours) at 01/25/2018 1109 Last data filed at 01/25/2018 1053 Gross per 24 hour  Intake 240 ml  Output 2714 ml  Net -2474 ml    LABS: Basic Metabolic Panel: Recent Labs    01/23/18 1931 01/24/18 0514  NA 140 139  K 4.1 3.8  CL 103 102  CO2 26 29  GLUCOSE 102* 100*  BUN 20 20  CREATININE 0.78 0.80  CALCIUM 9.1 8.4*   Liver Function Tests: Recent Labs    01/23/18 1931  AST 25  ALT 14  ALKPHOS 65  BILITOT 0.5  PROT 7.0  ALBUMIN 3.8   No results for input(s): LIPASE, AMYLASE in the last 72 hours. CBC: Recent Labs    01/23/18 1931 01/24/18 0514  WBC 6.7 6.0  NEUTROABS 2.9  --   HGB 11.6* 11.1*  HCT 35.6* 34.6*  MCV 94.9 96.1  PLT 327 312   Cardiac Enzymes: Recent Labs    01/24/18 1005 01/24/18 1538 01/24/18 2132  TROPONINI <0.03 <0.03 <0.03   BNP: Invalid input(s): POCBNP D-Dimer: No results for input(s): DDIMER in the last 72 hours. Hemoglobin A1C: Recent Labs    01/24/18 0958  HGBA1C 5.8*   Fasting Lipid Panel: Recent Labs    01/24/18 0958  CHOL 247*  HDL 56  LDLCALC 162*  TRIG 145  CHOLHDL 4.4   Thyroid Function Tests: No results for input(s): TSH, T4TOTAL, T3FREE, THYROIDAB in the last 72 hours.  Invalid input(s): FREET3 Anemia Panel: No results for input(s): VITAMINB12, FOLATE, FERRITIN, TIBC, IRON, RETICCTPCT in the last 72 hours.   PHYSICAL EXAM General: Well developed, well nourished, in no acute distress HEENT:  Normocephalic and atramatic Neck:  No JVD.  Lungs: Clear bilaterally to auscultation and percussion. Heart: HRRR . Normal S1 and S2 without gallops or murmurs.  Abdomen: Bowel sounds are positive, abdomen soft and non-tender   Msk:  Back normal, normal gait. Normal strength and tone for age. Extremities: No clubbing, cyanosis or edema.   Neuro: Alert and oriented X 3. Psych:  Good affect, responds appropriately  TELEMETRY: NSR  ASSESSMENT AND PLAN: Patient had a cardiac catheterization which shows mid LAD stent having restenosis 40 to 50% in the distal portion and 50% obtuse marginal lesion and occluded RCA proximally with very good collaterals from the LAD.  Normal left ventricular systolic function with normal wall motion.  Patient became very diaphoretic and had blood pressure around 119 systolic and was given some fluid in the Cath Lab and afterward diuresed 1100 cc.  Patient had similar finding on cardiac catheterization 6 months back also.  Since ejection fraction is normal and wall motion is normal and cardiac cath films were reviewed by Dr.Arida as well as Dr. Towanda Malkin, for second opinion and they all agreed that medical treatment is the management.  However she is a smoker and advise getting pulmonary consultation may be COPD as cause of shortness of breath as she was desaturating to 85 prior to cardiac catheterization. Active Problems:   Unstable angina (HCC)    Dionisio David, MD, [facility] 01/25/2018  11:09 AM

## 2018-01-25 NOTE — Progress Notes (Signed)
Continues to be very sleeping following the cardiac cath.  She did stay awake long enough to eat dinner.  O2 sats on room air was 80%.  On 2L [place] it is 96%.  She doesn't doesn't think her symptoms are related to her breathing despite these numbers

## 2018-01-25 NOTE — Consult Note (Signed)
[facility] Consultation      Assessment and Plan:  Dyspnea on exertion, uncertain etiology. - COPD may be contributory though I do not see significant emphysematous changes on her CT chest. - Recommend continued DuoNeb - Can change steroids to oral, wean down as tolerated. - Recommend initiation of long-acting inhaler such as Breo empirically to see if this helps with breathing. --Follow up outpatient in 2-4 weeks.   Coronary artery disease. -Status post heart cath 01/25/2018.  Showed stable coronary disease. - Recommended continued medical management.  Obesity/deconditioning. - May be contributing to dyspnea. - May benefit from outpatient PT.  Date: 01/25/2018  MRN# [id number] Linda Mejia 08-27-1955  Referring Physician: Dr. Benjie Karvonen for dyspnea.   Linda Mejia is a 62 y.o. old female seen in consultation for chief complaint of:    Chief Complaint  Patient presents with  . Shortness of Breath    HPI:   The patient is a 62 year old female, she presented to the ER on 01/22/2018 with complaints of shortness of breath she been present for approximately 1 month.  With a previous several months she has had several ED visits for multiple complaints including right upper quadrant pain, leg cramping, diarrhea.  She noted that her dyspnea was made worse by laying down.  CT Angio was obtained which was negative for pulmonary embolism.  Her Ranexa was increased, she was recommended follow-up with her cardiologist. She presented back to the ED on 10/12 for progressive shortness of breath, diaphoresis, tingling in her left arm.  Patient underwent cardiac catheterization today, which apparently showed continued stable coronary artery disease, medical management was recommended.  Was recommended that she had a palm consultation to look for any evidence of respiratory disease which could be contributing to her symptoms. Patient tells me that she was a previous smoker, she has not  smoked in the last 10 years.  She has not been very active, she is disabled from a back injury.  She has no known occupational exposures, she tells me that she may have previously been diagnosed with COPD but is not on any inhalers currently.  **Cardiac catheterization 01/25/2018>> Patient had a cardiac catheterization which shows mid LAD stent having restenosis 40 to 50% in the distal portion and 50% obtuse marginal lesion and occluded RCA proximally with very good collaterals from the LAD.  Normal left ventricular systolic function with normal wall motion. Patient had similar finding on cardiac catheterization 6 months back also, medical treatment is the management **CT chest 01/22/2018>> imaging personally reviewed, there is slight bibasilar patchy atelectasis, lungs are otherwise unremarkable. **Echocardiogram 01/24/2018>> EF 60%.   PMHX:   Past Medical History:  Diagnosis Date  . Abnormal ankle brachial index (ABI) 12/02/2016  . ADHD (attention deficit hyperactivity disorder)   . Anxiety   . Aortic atherosclerosis ([place]) 12/02/2016   July 2018  . Chronic back pain   . Coronary artery disease   . Herpes genitalis in women   . History of stomach ulcers    x7 this year. Bleeding  . Hypertension   . Prediabetes 12/02/2016   Surgical Hx:  Past Surgical History:  Procedure Laterality Date  . CARDIAC CATHETERIZATION    . CESAREAN SECTION    . ESOPHAGOGASTRODUODENOSCOPY (EGD) WITH PROPOFOL N/A 09/21/2015   Procedure: ESOPHAGOGASTRODUODENOSCOPY (EGD) WITH PROPOFOL;  Surgeon: Lucilla Lame, MD;  Location: [facility] ENDOSCOPY;  Service: Endoscopy;  Laterality: N/A;  . ESOPHAGOGASTRODUODENOSCOPY (EGD) WITH PROPOFOL N/A 07/30/2016   Procedure: ESOPHAGOGASTRODUODENOSCOPY (EGD) WITH  PROPOFOL;  Surgeon: Jonathon Bellows, MD;  Location: [facility] ENDOSCOPY;  Service: Endoscopy;  Laterality: N/A;  . LEFT HEART CATH AND CORONARY ANGIOGRAPHY Right 08/12/2016   Procedure: Left Heart Cath and Coronary Angiography;  Surgeon:  Dionisio David, MD;  Location: [facility];  Service: Cardiovascular;  Laterality: Right;  . TONSILLECTOMY    . TUBAL LIGATION     Family Hx:  Family History  Problem Relation Age of Onset  . Diabetes Mother   . CVA Mother   . Heart disease Father   . Heart disease Sister   . Heart disease Brother   . Heart disease Sister   . Heart disease Sister   . Heart disease Sister   . Heart disease Brother   . Heart disease Brother   . Heart disease Brother    Social Hx:   Social History   Tobacco Use  . Smoking status: Former Smoker    Packs/day: 1.00    Years: 30.00    Pack years: 30.00    Types: Cigarettes    Last attempt to quit: 2006    Years since quitting: 13.7  . Smokeless tobacco: Never Used  . Tobacco comment: smoking cessation materials not required  Substance Use Topics  . Alcohol use: Yes    Alcohol/week: 1.0 standard drinks    Types: 1 Glasses of wine per week    Frequency: Never    Comment: occasionally  . Drug use: No   Medication:    Current Facility-Administered Medications:  .  0.9 %  sodium chloride infusion, 250 mL, Intravenous, PRN, Jake Bathe, FNP .  0.9 %  sodium chloride infusion, 250 mL, Intravenous, PRN, Neoma Laming A, MD .  [EXPIRED] 0.9% sodium chloride infusion, 3 mL/kg/hr, Intravenous, Continuous, Last Rate: 283.5 mL/hr at 01/25/18 0803, 3 mL/kg/hr at 01/25/18 0803 **FOLLOWED BY** 0.9% sodium chloride infusion, 1 mL/kg/hr, Intravenous, Continuous, Jake Bathe, FNP, Last Rate: 94.5 mL/hr at 01/25/18 0903, 1 mL/kg/hr at 01/25/18 0903 .  0.9% sodium chloride infusion, 1 mL/kg/hr, Intravenous, Continuous, Dionisio David, MD .  Doug Sou Hold] acetaminophen (TYLENOL) tablet 650 mg, 650 mg, Oral, Q6H PRN **OR** [MAR Hold] acetaminophen (TYLENOL) suppository 650 mg, 650 mg, Rectal, Q6H PRN, [name], MD .  acetaminophen (TYLENOL) tablet 650 mg, 650 mg, Oral, Q4H PRN, Dionisio David, MD .  Doug Sou Hold] acyclovir (ZOVIRAX) 200  MG capsule 400 mg, 400 mg, Oral, BID, [name], MD, 400 mg at 01/24/18 2119 .  [MAR Hold] amphetamine-dextroamphetamine (ADDERALL) tablet 30 mg, 30 mg, Oral, BID WC, Cyndee Brightly Nada, [place] .  [MAR Hold] aspirin EC tablet 81 mg, 81 mg, Oral, Daily, Mody, Sital, MD .  Doug Sou Hold] bisacodyl (DULCOLAX) EC tablet 5 mg, 5 mg, Oral, Daily PRN, [name], MD .  Doug Sou Hold] cholecalciferol (VITAMIN D) tablet 1,000 Units, 1,000 Units, Oral, Daily, [name], MD, 1,000 Units at 01/24/18 1116 .  [MAR Hold] diazepam (VALIUM) tablet 10 mg, 10 mg, Oral, Q8H PRN, [name], MD .  Doug Sou Hold] docusate sodium (COLACE) capsule 100 mg, 100 mg, Oral, BID, [name], MD, 100 mg at 01/24/18 2119 .  [MAR Hold] fluticasone (FLONASE) 50 MCG/ACT nasal spray 2 spray, 2 spray, Each Nare, PRN, [name], MD .  furosemide (LASIX) 10 MG/ML injection, , , ,  .  [MAR Hold] heparin injection 5,000 Units, 5,000 Units, Subcutaneous, Q8H, [name], MD, 5,000 Units at 01/25/18 0548 .  [MAR Hold] HYDROcodone-acetaminophen (NORCO/VICODIN) 5-325 MG per tablet 1-2  tablet, 1-2 tablet, Oral, Q4H PRN, [name], MD, 2 tablet at 01/24/18 2118 .  ipratropium-albuterol (DUONEB) 0.5-2.5 (3) MG/3ML nebulizer solution 3 mL, 3 mL, Nebulization, Q4H, Mody, Sital, MD .  methylPREDNISolone sodium succinate (SOLU-MEDROL) 125 mg/2 mL injection 60 mg, 60 mg, Intravenous, Q24H, Mody, Sital, MD .  Doug Sou Hold] metoprolol tartrate (LOPRESSOR) tablet 50 mg, 50 mg, Oral, BID, [name], MD, 50 mg at 01/24/18 2119 .  [MAR Hold] ondansetron (ZOFRAN) tablet 4 mg, 4 mg, Oral, Q6H PRN **OR** [MAR Hold] ondansetron (ZOFRAN) injection 4 mg, 4 mg, Intravenous, Q6H PRN, [name], MD, 4 mg at 01/24/18 1520 .  [MAR Hold] ranolazine (RANEXA) 12 hr tablet 1,000 mg, 1,000 mg, Oral, BID, [name], MD, 1,000 mg at 01/24/18 2119 .  [MAR Hold] rosuvastatin (CRESTOR) tablet 10 mg, 10 mg, Oral, Daily, Mody, Sital, MD, 10 mg at 01/24/18  1119 .  sodium chloride flush (NS) 0.9 % injection 3 mL, 3 mL, Intravenous, Q12H, Jake Bathe, FNP, 3 mL at 01/24/18 2119 .  sodium chloride flush (NS) 0.9 % injection 3 mL, 3 mL, Intravenous, PRN, Jake Bathe, FNP .  sodium chloride flush (NS) 0.9 % injection 3 mL, 3 mL, Intravenous, Q12H, Neoma Laming A, MD .  sodium chloride flush (NS) 0.9 % injection 3 mL, 3 mL, Intravenous, PRN, Dionisio David, MD .  Doug Sou Hold] traZODone (DESYREL) tablet 25 mg, 25 mg, Oral, QHS PRN, [name], MD, 25 mg at 01/24/18 2119 .  [MAR Hold] vitamin C (ASCORBIC ACID) tablet 250 mg, 250 mg, Oral, Daily, [name], MD, 250 mg at 01/24/18 1119   Allergies:  Patient has no known allergies.  Review of Systems: Gen:  Denies  fever, sweats, chills HEENT: Denies blurred vision, double vision. bleeds, sore throat Cvc:  No dizziness, chest pain. Resp:   Denies cough or sputum production, shortness of breath Gi: Denies swallowing difficulty, stomach pain. Gu:  Denies bladder incontinence, burning urine Ext:   No Joint pain, stiffness. Skin: No skin rash,  hives  Endoc:  No polyuria, polydipsia. Psych: No depression, insomnia. Other:  All other systems were reviewed with the patient and were negative other that what is mentioned in the HPI.   Physical Examination:   VS: BP 124/78   Pulse 84   Temp 98.4 F (36.9 C) (Oral)   Resp 14   Ht 5\' 6"  (1.676 m)   Wt 95 kg   SpO2 98%   BMI 33.81 kg/m   General Appearance: No distress  Neuro:without focal findings,  speech normal,  HEENT: PERRLA, EOM intact.   Pulmonary: normal breath sounds, No wheezing.  CardiovascularNormal S1,S2.  No m/r/g.   Abdomen: Benign, Soft, non-tender. Renal:  No costovertebral tenderness  GU:  No performed at this time. Endoc: No evident thyromegaly, no signs of acromegaly. Skin:   warm, no rashes, no ecchymosis  Extremities: normal, no cyanosis, clubbing.  Other findings:    LABORATORY PANEL:    CBC Recent Labs  Lab 01/24/18 0514  WBC 6.0  HGB 11.1*  HCT 34.6*  PLT 312   ------------------------------------------------------------------------------------------------------------------  Chemistries  Recent Labs  Lab 01/23/18 1931 01/24/18 0514  NA 140 139  K 4.1 3.8  CL 103 102  CO2 26 29  GLUCOSE 102* 100*  BUN 20 20  CREATININE 0.78 0.80  CALCIUM 9.1 8.4*  AST 25  --   ALT 14  --   ALKPHOS 65  --   BILITOT 0.5  --    ------------------------------------------------------------------------------------------------------------------  Cardiac Enzymes Recent Labs  Lab 01/24/18 2132  TROPONINI <0.03   ------------------------------------------------------------  RADIOLOGY:  Dg Chest 2 View  Result Date: 01/23/2018 CLINICAL DATA:  Shortness of breath for 2 days. EXAM: CHEST - 2 VIEW COMPARISON:  01/22/2018 FINDINGS: The heart size and mediastinal contours are within normal limits. Aortic atherosclerosis. Both lungs are clear. Old left rib fracture deformities again noted. IMPRESSION: No active cardiopulmonary disease. Electronically Signed   By: Earle Gell M.D.   On: 01/23/2018 19:49       Thank  you for the consultation and for allowing [facility] to assist in the care of your patient. Our recommendations are noted above.  Please contact us if we can be of further service.   Marda Stalker, M.D., F.C.C.P.  Board Certified in Internal Medicine, Pulmonary Medicine, [place], and Sleep Medicine.  [facility] Office Number: [phone]   01/25/2018

## 2018-01-25 NOTE — Plan of Care (Signed)
  Problem: Activity: Goal: Ability to tolerate increased activity will improve Outcome: Progressing   Problem: Cardiovascular: Goal: Ability to achieve and maintain adequate cardiovascular perfusion will improve Outcome: Progressing Goal: Vascular access site(s) Level 0-1 will be maintained Outcome: Progressing   Problem: Education: Goal: Knowledge of disease or condition will improve Outcome: Progressing Goal: Knowledge of the prescribed therapeutic regimen will improve Outcome: Progressing   Problem: Respiratory: Goal: Levels of oxygenation will improve Outcome: Progressing

## 2018-01-25 NOTE — OR Nursing (Signed)
Reporting ned to void, scant amount of urine using bedpan. Pt reports continued pressure in bladder. Dr Humphrey Rolls paged for in and out cath order.

## 2018-01-25 NOTE — OR Nursing (Signed)
END TIDAL CO2 UPPER 50'S. STARTED ON 2 LITERS [place] PRIOR TO ARRIVAL DUE TO OXYGEN SATUTATIONS 80S DR [facility] NOTIFIED. IV LASIX ORDERED AND GIVEN PRE CATH.

## 2018-01-26 ENCOUNTER — Telehealth: Payer: Self-pay

## 2018-01-26 ENCOUNTER — Encounter: Payer: Self-pay | Admitting: Cardiovascular Disease

## 2018-01-26 LAB — GLUCOSE, CAPILLARY: Glucose-Capillary: 95 mg/dL (ref 70–99)

## 2018-01-26 LAB — HIV ANTIBODY (ROUTINE TESTING W REFLEX): HIV Screen 4th Generation wRfx: NONREACTIVE

## 2018-01-26 MED ORDER — PREDNISONE 50 MG PO TABS
50.0000 mg | ORAL_TABLET | Freq: Every day | ORAL | Status: DC
  Administered 2018-01-26: 50 mg via ORAL
  Filled 2018-01-26: qty 1

## 2018-01-26 MED ORDER — NITROGLYCERIN 0.4 MG SL SUBL: 0.4000 mg | SUBLINGUAL_TABLET | SUBLINGUAL | 0 refills | Status: DC | PRN

## 2018-01-26 MED ORDER — FLUTICASONE FUROATE-VILANTEROL 200-25 MCG/INH IN AEPB: 1.0000 | INHALATION_SPRAY | Freq: Every day | RESPIRATORY_TRACT | 0 refills | Status: DC

## 2018-01-26 MED ORDER — PREDNISONE 50 MG PO TABS: ORAL_TABLET | ORAL | 0 refills | Status: AC

## 2018-01-26 MED ORDER — ROSUVASTATIN CALCIUM 10 MG PO TABS: 10.0000 mg | ORAL_TABLET | Freq: Every day | ORAL | 0 refills | Status: DC

## 2018-01-26 MED ORDER — IPRATROPIUM-ALBUTEROL 0.5-2.5 (3) MG/3ML IN SOLN: 3.0000 mL | RESPIRATORY_TRACT | 0 refills | Status: DC | PRN

## 2018-01-26 NOTE — Progress Notes (Signed)
SUBJECTIVE: Patient is feeling much better denies any shortness of breath   Vitals:   01/25/18 2136 01/26/18 0441 01/26/18 0832 01/26/18 0942  BP: 123/84 127/81 109/67   Pulse: 98 79 79   Resp:  18    Temp:  97.9 F (36.6 C) 97.7 F (36.5 C)   TempSrc:   Oral   SpO2: 91% 96% 93% 92%  Weight:  93.8 kg    Height:        Intake/Output Summary (Last 24 hours) at 01/26/2018 0950 Last data filed at 01/26/2018 0500 Gross per 24 hour  Intake 541.5 ml  Output 3064 ml  Net -2522.5 ml    LABS: Basic Metabolic Panel: Recent Labs    01/23/18 1931 01/24/18 0514  NA 140 139  K 4.1 3.8  CL 103 102  CO2 26 29  GLUCOSE 102* 100*  BUN 20 20  CREATININE 0.78 0.80  CALCIUM 9.1 8.4*   Liver Function Tests: Recent Labs    01/23/18 1931  AST 25  ALT 14  ALKPHOS 65  BILITOT 0.5  PROT 7.0  ALBUMIN 3.8   No results for input(s): LIPASE, AMYLASE in the last 72 hours. CBC: Recent Labs    01/23/18 1931 01/24/18 0514  WBC 6.7 6.0  NEUTROABS 2.9  --   HGB 11.6* 11.1*  HCT 35.6* 34.6*  MCV 94.9 96.1  PLT 327 312   Cardiac Enzymes: Recent Labs    01/24/18 1005 01/24/18 1538 01/24/18 2132  TROPONINI <0.03 <0.03 <0.03   BNP: Invalid input(s): POCBNP D-Dimer: No results for input(s): DDIMER in the last 72 hours. Hemoglobin A1C: Recent Labs    01/24/18 0958  HGBA1C 5.8*   Fasting Lipid Panel: Recent Labs    01/24/18 0958  CHOL 247*  HDL 56  LDLCALC 162*  TRIG 145  CHOLHDL 4.4   Thyroid Function Tests: No results for input(s): TSH, T4TOTAL, T3FREE, THYROIDAB in the last 72 hours.  Invalid input(s): FREET3 Anemia Panel: No results for input(s): VITAMINB12, FOLATE, FERRITIN, TIBC, IRON, RETICCTPCT in the last 72 hours.   PHYSICAL EXAM General: Well developed, well nourished, in no acute distress HEENT:  Normocephalic and atramatic Neck:  No JVD.  Lungs: Clear bilaterally to auscultation and percussion. Heart: HRRR . Normal S1 and S2 without gallops or  murmurs.  Abdomen: Bowel sounds are positive, abdomen soft and non-tender  Msk:  Back normal, normal gait. Normal strength and tone for age. Extremities: No clubbing, cyanosis or edema.   Neuro: Alert and oriented X 3. Psych:  Good affect, responds appropriately  TELEMETRY: Sinus rhythm  ASSESSMENT AND PLAN: Status post cardiac catheterization with occluded proximal RCA and 50% distal LAD disease in the distal portion of the stent with good collaterals to the RCA from LAD and mild disease in the obtuse marginal.  Normal left ventricular systolic function about 82% patient has diastolic dysfunction heart failure and may be COPD.  Patient is refusing evaluation for pulmonary consult.  The daughter wants to have thyroid was evaluated.  Will recommend discharge with follow-up this Friday at 10 AM in the office.  Active Problems:   Unstable angina (HCC)    Linda Laming A, MD, [facility] 01/26/2018 9:50 AM

## 2018-01-26 NOTE — Telephone Encounter (Signed)
Copied from [place] [id number]. Topic: Appointment Scheduling - Scheduling Inquiry for Clinic >> Jan 26, 2018 10:04 AM Sheran Luz wrote: Reason for CRM: Pt is supposed to be scheduled for [facility] a week from today on 10/22-due to availability of pcp, pt was scheduled on the 29th. Santiago Glad with [facility] stated it was okay to sched pt on 29th but would really like pt seen sooner, if possible.

## 2018-01-26 NOTE — Care Management (Addendum)
Patient refused nebulizer machine. Update: she then agreed and it was ordered through [place] with [facility].

## 2018-01-26 NOTE — Plan of Care (Signed)
  Problem: Clinical Measurements: Goal: Ability to maintain clinical measurements within normal limits will improve Outcome: Adequate for Discharge   Problem: Clinical Measurements: Goal: Respiratory complications will improve Outcome: Adequate for Discharge

## 2018-01-26 NOTE — Discharge Summary (Signed)
[facility] [place] NAME: Linda Mejia    MR#:  [id number]  DATE OF BIRTH:  [birth year]  DATE OF ADMISSION:  01/23/2018 ADMITTING PHYSICIAN: [name], MD  DATE OF DISCHARGE: 01/26/2018  PRIMARY CARE PHYSICIAN: Arnetha Courser, MD    ADMISSION DIAGNOSIS:  SOB (shortness of breath) [R06.02]  DISCHARGE DIAGNOSIS:  Active Problems:   Chest pain SOB  SECONDARY DIAGNOSIS:   Past Medical History:  Diagnosis Date  . Abnormal ankle brachial index (ABI) 12/02/2016  . ADHD (attention deficit hyperactivity disorder)   . Anxiety   . Aortic atherosclerosis ([place]) 12/02/2016   July 2018  . Chronic back pain   . Coronary artery disease   . Herpes genitalis in women   . History of stomach ulcers    x7 this year. Bleeding  . Hypertension   . Prediabetes 12/02/2016    HOSPITAL COURSE:  62 year old female with history of CAD/stent and COPD who presents with chest pain.  1.  Chest pain with CAD: She is Status post cardiac catheterization with occluded proximal RCA and 50% distal LAD disease in the distal portion of the stent with good collaterals to the RCA from LAD and mild disease in the obtuse marginal.  Normal left ventricular systolic function about 25% patient has diastolic dysfunction heart failure She will continue aspirin, Ranexa, metoprolol, statin  2.  ADHD: Continue Adderall  3.  Essential hypertension: Continue metoprolol 4. COPD with SOB and mild exacerbation: Patient was evaluated by pulmonary services.  She will be discharged on oral steroids, inhalers and nebulizer.  She will follow-up with Dr. Ashby Dawes in 1 week.  DISCHARGE CONDITIONS AND DIET:   Stable for discharge on heart healthy cardiac diet  CONSULTS OBTAINED:  Treatment Team:  Dionisio David, MD  DRUG ALLERGIES:  No Known Allergies  DISCHARGE MEDICATIONS:   Allergies as of 01/26/2018   No Known Allergies     Medication List    TAKE these medications    acyclovir 400 MG tablet Commonly known as:  ZOVIRAX take 1 tablet by mouth twice a day   amphetamine-dextroamphetamine 30 MG tablet Commonly known as:  ADDERALL Take 30 mg by mouth 2 (two) times daily.   aspirin EC 81 MG tablet Take 81 mg by mouth daily.   cholecalciferol 1000 units tablet Commonly known as:  VITAMIN D Take 1,000 Units by mouth daily.   diazepam 10 MG tablet Commonly known as:  VALIUM Take 1 tablet (10 mg total) by mouth every 8 (eight) hours as needed for anxiety.   Fish Oil 1000 MG Caps Take 1 capsule by mouth daily.   fluticasone 50 MCG/ACT nasal spray Commonly known as:  FLONASE Place 2 sprays into both nostrils as needed.   fluticasone furoate-vilanterol 200-25 MCG/INH Aepb Commonly known as:  BREO ELLIPTA Inhale 1 puff into the lungs daily.   ipratropium-albuterol 0.5-2.5 (3) MG/3ML Soln Commonly known as:  DUONEB Take 3 mLs by nebulization every 4 (four) hours as needed.   metoprolol tartrate 50 MG tablet Commonly known as:  LOPRESSOR Take 1 tablet (50 mg total) by mouth 2 (two) times daily.   nitroGLYCERIN 0.4 MG SL tablet Commonly known as:  NITROSTAT Place 1 tablet (0.4 mg total) under the tongue every 5 (five) minutes as needed for chest pain.   PRALUENT 75 MG/ML Sopn Generic drug:  Alirocumab Inject 75 mLs as directed every 14 (fourteen) days.   predniSONE 50 MG tablet Commonly known as:  DELTASONE Take 1 tablet daily for 4 days then stop   ranolazine 500 MG 12 hr tablet Commonly known as:  RANEXA Take 2 tablets (1,000 mg total) by mouth 2 (two) times daily for 15 days.   rosuvastatin 10 MG tablet Commonly known as:  CRESTOR Take 1 tablet (10 mg total) by mouth daily.   triamcinolone cream 0.1 % Commonly known as:  KENALOG Apply 1 application topically 2 (two) times daily. As needed; too strong for face, under arms, in the groin   vitamin C 100 MG tablet Take 100 mg by mouth daily.            Durable Medical  Equipment  (From admission, onward)         Start     Ordered   01/26/18 0000  DME Nebulizer machine    Question:  Patient needs a nebulizer to treat with the following condition  Answer:  COPD (chronic obstructive pulmonary disease) ([place])   01/26/18 0942            Today   CHIEF COMPLAINT:  Patient is doing better this morning.  Shortness of breath is improving.  No chest pain or palpitations   VITAL SIGNS:  Blood pressure 109/67, pulse 79, temperature 97.7 F (36.5 C), temperature source Oral, resp. rate 18, height 5\' 6"  (1.676 m), weight 93.8 kg, SpO2 93 %.   REVIEW OF SYSTEMS:  Review of Systems  Constitutional: Negative.  Negative for chills, fever and malaise/fatigue.  HENT: Negative.  Negative for ear discharge, ear pain, hearing loss, nosebleeds and sore throat.   Eyes: Negative.  Negative for blurred vision and pain.  Respiratory: Negative.  Negative for cough, hemoptysis, shortness of breath and wheezing.   Cardiovascular: Negative.  Negative for chest pain, palpitations and leg swelling.  Gastrointestinal: Negative.  Negative for abdominal pain, blood in stool, diarrhea, nausea and vomiting.  Genitourinary: Negative.  Negative for dysuria.  Musculoskeletal: Negative.  Negative for back pain.  Skin: Negative.   Neurological: Negative for dizziness, tremors, speech change, focal weakness, seizures and headaches.  Endo/Heme/Allergies: Negative.  Does not bruise/bleed easily.  Psychiatric/Behavioral: Negative.  Negative for depression, hallucinations and suicidal ideas.     PHYSICAL EXAMINATION:  GENERAL:  62 y.o.-year-old patient lying in the bed with no acute distress.  NECK:  Supple, no jugular venous distention. No thyroid enlargement, no tenderness.  LUNGS: Normal breath sounds bilaterally, no wheezing, rales,rhonchi  No use of accessory muscles of respiration.  CARDIOVASCULAR: S1, S2 normal. No murmurs, rubs, or gallops.  ABDOMEN: Soft, non-tender,  non-distended. Bowel sounds present. No organomegaly or mass.  EXTREMITIES: No pedal edema, cyanosis, or clubbing.  PSYCHIATRIC: The patient is alert and oriented x 3.  SKIN: No obvious rash, lesion, or ulcer.   DATA REVIEW:   CBC Recent Labs  Lab 01/24/18 0514  WBC 6.0  HGB 11.1*  HCT 34.6*  PLT 312    Chemistries  Recent Labs  Lab 01/23/18 1931 01/24/18 0514  NA 140 139  K 4.1 3.8  CL 103 102  CO2 26 29  GLUCOSE 102* 100*  BUN 20 20  CREATININE 0.78 0.80  CALCIUM 9.1 8.4*  AST 25  --   ALT 14  --   ALKPHOS 65  --   BILITOT 0.5  --     Cardiac Enzymes Recent Labs  Lab 01/24/18 1005 01/24/18 1538 01/24/18 2132  TROPONINI <0.03 <0.03 <0.03    Microbiology Results  @[id number] @  RADIOLOGY:  No results  found.    Allergies as of 01/26/2018   No Known Allergies     Medication List    TAKE these medications   acyclovir 400 MG tablet Commonly known as:  ZOVIRAX take 1 tablet by mouth twice a day   amphetamine-dextroamphetamine 30 MG tablet Commonly known as:  ADDERALL Take 30 mg by mouth 2 (two) times daily.   aspirin EC 81 MG tablet Take 81 mg by mouth daily.   cholecalciferol 1000 units tablet Commonly known as:  VITAMIN D Take 1,000 Units by mouth daily.   diazepam 10 MG tablet Commonly known as:  VALIUM Take 1 tablet (10 mg total) by mouth every 8 (eight) hours as needed for anxiety.   Fish Oil 1000 MG Caps Take 1 capsule by mouth daily.   fluticasone 50 MCG/ACT nasal spray Commonly known as:  FLONASE Place 2 sprays into both nostrils as needed.   fluticasone furoate-vilanterol 200-25 MCG/INH Aepb Commonly known as:  BREO ELLIPTA Inhale 1 puff into the lungs daily.   ipratropium-albuterol 0.5-2.5 (3) MG/3ML Soln Commonly known as:  DUONEB Take 3 mLs by nebulization every 4 (four) hours as needed.   metoprolol tartrate 50 MG tablet Commonly known as:  LOPRESSOR Take 1 tablet (50 mg total) by mouth 2 (two) times daily.    nitroGLYCERIN 0.4 MG SL tablet Commonly known as:  NITROSTAT Place 1 tablet (0.4 mg total) under the tongue every 5 (five) minutes as needed for chest pain.   PRALUENT 75 MG/ML Sopn Generic drug:  Alirocumab Inject 75 mLs as directed every 14 (fourteen) days.   predniSONE 50 MG tablet Commonly known as:  DELTASONE Take 1 tablet daily for 4 days then stop   ranolazine 500 MG 12 hr tablet Commonly known as:  RANEXA Take 2 tablets (1,000 mg total) by mouth 2 (two) times daily for 15 days.   rosuvastatin 10 MG tablet Commonly known as:  CRESTOR Take 1 tablet (10 mg total) by mouth daily.   triamcinolone cream 0.1 % Commonly known as:  KENALOG Apply 1 application topically 2 (two) times daily. As needed; too strong for face, under arms, in the groin   vitamin C 100 MG tablet Take 100 mg by mouth daily.            Durable Medical Equipment  (From admission, onward)         Start     Ordered   01/26/18 0000  DME Nebulizer machine    Question:  Patient needs a nebulizer to treat with the following condition  Answer:  COPD (chronic obstructive pulmonary disease) ([place])   01/26/18 0942             Management plans discussed with the patient and she is in agreement. Stable for discharge home  Patient should follow up with pcp  CODE STATUS:     Code Status Orders  (From admission, onward)         Start     Ordered   01/23/18 2319  Full code  Continuous     01/23/18 2319        Code Status History    Date Active Date Inactive Code Status Order ID Comments User Context   08/11/2016 1909 08/12/2016 1840 Full Code [id number]  [name], MD Inpatient   07/29/2016 1533 07/30/2016 2117 Full Code [id number]  [name], MD ED   09/21/2015 0229 09/22/2015 1418 Full Code [id number]  Saundra Shelling, MD Inpatient      TOTAL  TIME TAKING CARE OF THIS PATIENT: 38 minutes.    Note: This dictation was prepared with Dragon dictation along with smaller phrase  technology. Any transcriptional errors that result from this process are unintentional.  Makye Radle M.D on 01/26/2018 at 9:42 AM  Between 7am to 6pm - Pager - [phone] After 6pm go to [email or web address] - password [email or web address] [facility]  Office  [phone]  CC: Primary care physician; Arnetha Courser, MD

## 2018-01-26 NOTE — Progress Notes (Signed)
Patient discharged home today with daughter, VSS, patient educated on new prescriptions and femoral site care. She has also been provided and educated on use of nebulizer for home use. She has acknowledged her follow up appointments and is agreement with the plan of care post discharge. No complaints at this time.

## 2018-01-26 NOTE — Telephone Encounter (Signed)
Called [phone] @ 2:15 left message asking pt to return call to be seen on Thursday with Dr Sanda Klein @ 8:20

## 2018-01-26 NOTE — Progress Notes (Signed)
[facility] [place] NAME: Linda Mejia    MR#:  [id number]  DATE OF BIRTH:  [birth year]  SUBJECTIVE:  Linda Mejia for cath and wants to know why at times she is feeling SOB   REVIEW OF SYSTEMS:    Review of Systems  Constitutional: Negative for fever, chills weight loss HENT: Negative for ear pain, nosebleeds, congestion, facial swelling, rhinorrhea, neck pain, neck stiffness and ear discharge.   Respiratory: Negative for cough, shortness of breath (at times), wheezing  Cardiovascular: Negative for chest pain, palpitations and leg swelling.  Gastrointestinal: Negative for heartburn, abdominal pain, vomiting, diarrhea or consitpation Genitourinary: Negative for dysuria, urgency, frequency, hematuria Musculoskeletal: Negative for back pain or joint pain Neurological: Negative for dizziness, seizures, syncope, focal weakness,  numbness and headaches.  Hematological: Does not bruise/bleed easily.  Psychiatric/Behavioral: Negative for hallucinations, confusion, dysphoric mood    Tolerating Diet: yes      DRUG ALLERGIES:  No Known Allergies  VITALS:  Blood pressure 109/67, pulse 79, temperature 97.7 F (36.5 C), temperature source Oral, resp. rate 18, height 5\' 6"  (1.676 m), weight 93.8 kg, SpO2 92 %.  PHYSICAL EXAMINATION:  Constitutional: Appears well-developed and well-nourished. No distress. HENT: Normocephalic. Marland Kitchen Oropharynx is clear and moist.  Eyes: Conjunctivae and EOM are normal. PERRLA, no scleral icterus.  Neck: Normal ROM. Neck supple. No JVD. No tracheal deviation. CVS: RRR, S1/S2 +, no murmurs, no gallops, no carotid bruit.  Pulmonary: Effort and breath sounds normal, no stridor, rhonchi, wheezes, rales.  Abdominal: Soft. BS +,  no distension, tenderness, rebound or guarding.  Musculoskeletal: Normal range of motion. No edema and no tenderness.  Neuro: Alert. CN 2-12 grossly intact. No focal deficits. Skin: Skin is warm and dry. No  rash noted. Psychiatric: Normal mood and affect.      LABORATORY PANEL:   CBC Recent Labs  Lab 01/24/18 0514  WBC 6.0  HGB 11.1*  HCT 34.6*  PLT 312   ------------------------------------------------------------------------------------------------------------------  Chemistries  Recent Labs  Lab 01/23/18 1931 01/24/18 0514  NA 140 139  K 4.1 3.8  CL 103 102  CO2 26 29  GLUCOSE 102* 100*  BUN 20 20  CREATININE 0.78 0.80  CALCIUM 9.1 8.4*  AST 25  --   ALT 14  --   ALKPHOS 65  --   BILITOT 0.5  --    ------------------------------------------------------------------------------------------------------------------  Cardiac Enzymes Recent Labs  Lab 01/24/18 1005 01/24/18 1538 01/24/18 2132  TROPONINI <0.03 <0.03 <0.03   ------------------------------------------------------------------------------------------------------------------  RADIOLOGY:  No results found.   ASSESSMENT AND PLAN:   62 year old female with history of CAD/stent and COPD who presents with chest pain.  1. Chest pain with CAD: She is Status post cardiac catheterization with occluded proximal RCA and 50% distal LAD disease in the distal portion of the stent with good collaterals to the RCA from LAD and mild disease in the obtuse marginal. Normal left ventricular systolic function about 03% patient has diastolic dysfunction heart failure She will continue aspirin, Ranexa, metoprolol, statin  2. ADHD: Continue Adderall  3. Essential hypertension: Continue metoprolol 4. COPD with SOB and mild exacerbation: Pulmonary consult requested and d/r Dr Juanell Fairly.   CODE STATUS: full  TOTAL TIME TAKING CARE OF THIS PATIENT: 24 minutes.     POSSIBLE D/C tomorrow, DEPENDING ON CLINICAL CONDITION.   Katria Botts M.D on 01/26/2018 at 10:54 AM  Between 7am to 6pm - Pager - [phone] After 6pm go to [email or web address] - password  [email or web address]  [facility]  Office   [phone]  CC: Primary care physician; Arnetha Courser, MD  Note: This dictation was prepared with Dragon dictation along with smaller phrase technology. Any transcriptional errors that result from this process are unintentional.

## 2018-01-26 NOTE — Telephone Encounter (Signed)
Please add patient for [facility] f/u on Thursday @ 8:20

## 2018-01-26 NOTE — Care Management Important Message (Signed)
Copy of signed IM left with patient in room.  

## 2018-01-27 NOTE — Telephone Encounter (Signed)
Spoke with patient and she really does not want to come in this week. She is going to keep the appt she already have on file.

## 2018-02-02 DIAGNOSIS — G4730 Sleep apnea, unspecified: Secondary | ICD-10-CM | POA: Diagnosis not present

## 2018-02-02 DIAGNOSIS — I509 Heart failure, unspecified: Secondary | ICD-10-CM | POA: Diagnosis not present

## 2018-02-02 DIAGNOSIS — I2510 Atherosclerotic heart disease of native coronary artery without angina pectoris: Secondary | ICD-10-CM | POA: Diagnosis not present

## 2018-02-04 ENCOUNTER — Inpatient Hospital Stay: Payer: Medicare Other | Admitting: Internal Medicine

## 2018-02-08 ENCOUNTER — Inpatient Hospital Stay: Payer: Medicare Other | Admitting: Internal Medicine

## 2018-02-09 ENCOUNTER — Inpatient Hospital Stay: Payer: Medicare Other | Admitting: Family Medicine

## 2018-02-11 ENCOUNTER — Ambulatory Visit: Payer: Medicare Other | Admitting: Dietician

## 2018-03-09 ENCOUNTER — Ambulatory Visit (INDEPENDENT_AMBULATORY_CARE_PROVIDER_SITE_OTHER): Payer: Medicare Other | Admitting: Family Medicine

## 2018-03-09 ENCOUNTER — Other Ambulatory Visit: Payer: Self-pay | Admitting: Family Medicine

## 2018-03-09 ENCOUNTER — Encounter: Payer: Self-pay | Admitting: Family Medicine

## 2018-03-09 VITALS — BP 164/84 | HR 88 | Temp 97.9°F | Ht 66.0 in | Wt 206.9 lb

## 2018-03-09 DIAGNOSIS — Z8719 Personal history of other diseases of the digestive system: Secondary | ICD-10-CM

## 2018-03-09 DIAGNOSIS — R110 Nausea: Secondary | ICD-10-CM

## 2018-03-09 DIAGNOSIS — R1012 Left upper quadrant pain: Secondary | ICD-10-CM

## 2018-03-09 DIAGNOSIS — E039 Hypothyroidism, unspecified: Secondary | ICD-10-CM

## 2018-03-09 DIAGNOSIS — R195 Other fecal abnormalities: Secondary | ICD-10-CM

## 2018-03-09 DIAGNOSIS — R7303 Prediabetes: Secondary | ICD-10-CM

## 2018-03-09 DIAGNOSIS — I272 Pulmonary hypertension, unspecified: Secondary | ICD-10-CM

## 2018-03-09 DIAGNOSIS — D649 Anemia, unspecified: Secondary | ICD-10-CM

## 2018-03-09 DIAGNOSIS — E782 Mixed hyperlipidemia: Secondary | ICD-10-CM | POA: Diagnosis not present

## 2018-03-09 DIAGNOSIS — I2510 Atherosclerotic heart disease of native coronary artery without angina pectoris: Secondary | ICD-10-CM

## 2018-03-09 DIAGNOSIS — E038 Other specified hypothyroidism: Secondary | ICD-10-CM

## 2018-03-09 DIAGNOSIS — Z8711 Personal history of peptic ulcer disease: Secondary | ICD-10-CM

## 2018-03-09 HISTORY — DX: Pulmonary hypertension, unspecified: I27.20

## 2018-03-09 MED ORDER — AMLODIPINE BESYLATE 5 MG PO TABS: 5.0000 mg | ORAL_TABLET | Freq: Every day | ORAL | 0 refills | Status: DC

## 2018-03-09 NOTE — Patient Instructions (Addendum)
We'll have you see the gastroenterologist as soon as possible  Try to follow the DASH guidelines (DASH stands for Dietary Approaches to Stop Hypertension). Try to limit the sodium in your diet to no more than 1,500mg  of sodium per day. Certainly try to not exceed 2,000 mg per day at the very most. Do not add salt when cooking or at the table.  Check the sodium amount on labels when shopping, and choose items lower in sodium when given a choice. Avoid or limit foods that already contain a lot of sodium. Eat a diet rich in fruits and vegetables and whole grains, and try to lose weight if overweight or obese  Start the new blood pressure medicine  Return in 2 weeks

## 2018-03-09 NOTE — Assessment & Plan Note (Addendum)
Explained findings on the echo from October 2019; patient Dr. Humphrey Rolls told her she did not need to see a lung doctor

## 2018-03-09 NOTE — Assessment & Plan Note (Signed)
Last thyroid in the last 6 weeks, normal

## 2018-03-09 NOTE — Assessment & Plan Note (Signed)
Last A1c was in October and still prediabetic; encouraged weight loss

## 2018-03-09 NOTE — Assessment & Plan Note (Signed)
Managed by cardiologist; on

## 2018-03-09 NOTE — Progress Notes (Signed)
BP (!) 164/84   Pulse 88   Temp 97.9 F (36.6 C) (Oral)   Ht 5\' 6"  (1.676 m)   Wt 206 lb 14.4 oz (93.8 kg)   SpO2 97%   BMI 33.39 kg/m    Subjective:    Patient ID: Linda Mejia, female    DOB: [birth year], 62 y.o.   MRN: [id number]  HPI: Linda Mejia is a 62 y.o. female  Chief Complaint  Patient presents with  . Follow-up  . Fatigue    no energy, muscle pain  . Abdominal Pain    bloating with eating    HPI She is here for hospital f/u; admitted to the hospital on January 23, 2018 with exertional dyspnea; Discharged on January 26, 2018:  Hospital course copied and pasted: ------------------------------------------------------- 62 year old female with history of CAD/stent and COPD who presents with chest pain.  1. Chest pain with CAD: She is Status post cardiac catheterization with occluded proximal RCA and 50% distal LAD disease in the distal portion of the stent with good collaterals to the RCA from LAD and mild disease in the obtuse marginal. Normal left ventricular systolic function about 32% patient has diastolic dysfunction heart failure She will continue aspirin, Ranexa, metoprolol, statin  2. ADHD: Continue Adderall  3. Essential hypertension: Continue metoprolol 4. COPD with SOB and mild exacerbation: Patient was evaluated by pulmonary services.  She will be discharged on oral steroids, inhalers and nebulizer.  She will follow-up with Dr. Ashby Dawes in 1 week. --------------------------------------------------------  Echo study conclusions: Study Conclusions  - Left ventricle: The cavity size was normal. There was mild   concentric hypertrophy. Systolic function was normal. The   estimated ejection fraction was 60%. Wall motion was normal;   there were no regional wall motion abnormalities. Doppler   parameters are consistent with abnormal left ventricular   relaxation (grade 1 diastolic dysfunction). - Aortic valve: There was mild regurgitation. -  Mitral valve: There was mild regurgitation. - Left atrium: The atrium was mildly dilated.  Impressions:  - The right ventricular systolic pressure was increased consistent   with moderate pulmonary hypertension. Normal LVEF, normal   wallmotion, mild MR/TR/AR.  ----------------------------------------------  troponin I was less than 0.03; other labs reviewed A1c was 5.8 consistent with prediabetes D-dimer was 1,108.93; chest CT was done and showed no PE LDL was 162 in the hospital; cardiologist was working on the injection; he has that approved and using now; does not tolerate statins at all; cannot even take one a week  CTA of the chest, dense calcifications in the LAD and left circumflex She underwent a cardiac cath Dr. Humphrey Rolls, her heart doctor, wanted patient to have a sleep study and wanted her to switch from me to his wife She has been really fatigued; she has been sleeping a lot; feels tired through the day Going on for about a month Change in bowel habits; stomach hurts She wasn't feeling good since going in the hospital She has ADHD and there is documentation about Adderall use; she wanted to talk to her psychiatrist about another medicine per the note; Dr. Kasandra Knudsen is her psychiatrist and she has already talked to him, leaving it alone  Mild anemia noted in the hospital; H/H was 11.1/34.6 in October, normocytic, normochromic Stools were tan last week for 3 days, almost vanilla; no unusual odor; abdominal bloating and "felt like that time my appendix busted"; bad pain and used the bathroom and that made it feel better; pain is under  the left breast and goes back; hx of H pylori Normal CMP in October She has headaches and nausea and stomach pain; hx of gastric ulcer   Lab Results  Component Value Date   TSH 1.971 01/22/2018     Depression screen [facility] 2/9 03/09/2018 01/22/2018 12/15/2017 12/11/2017 06/19/2017  Decreased Interest 1 3 0 0 0  Down, Depressed, Hopeless 0 3 0 0 0  PHQ -  2 Score 1 6 0 0 0  Altered sleeping 3 0 0 0 -  Tired, decreased energy 3 3 0 0 -  Change in appetite 1 0 0 0 -  Feeling bad or failure about yourself  1 3 0 0 -  Trouble concentrating 0 0 0 0 -  Moving slowly or fidgety/restless 0 0 0 0 -  Suicidal thoughts 0 0 0 0 -  PHQ-9 Score 9 12 0 0 -  Difficult doing work/chores Somewhat difficult Very difficult Not difficult at all Not difficult at all -   Fall Risk  03/09/2018 01/22/2018 12/15/2017 12/11/2017 06/19/2017  Falls in the past year? 0 No No No No  Number falls in past yr: 0 - - - -  Injury with Fall? - - - - -  Comment - - - - -  Risk for fall due to : - - Impaired vision;History of fall(s);Medication side effect - -  Risk for fall due to: Comment - - wears eyeglasses; back pain; fell in tub - -    Relevant past medical, surgical, family and social history reviewed Past Medical History:  Diagnosis Date  . Abnormal ankle brachial index (ABI) 12/02/2016  . ADHD (attention deficit hyperactivity disorder)   . Anxiety   . Aortic atherosclerosis ([place]) 12/02/2016   July 2018  . Chronic back pain   . Coronary artery disease   . Herpes genitalis in women   . History of stomach ulcers    x7 this year. Bleeding  . Hypertension   . Prediabetes 12/02/2016  . Pulmonary hypertension ([place]) 03/09/2018   Echo Oct 2019   Past Surgical History:  Procedure Laterality Date  . CARDIAC CATHETERIZATION    . CESAREAN SECTION    . ESOPHAGOGASTRODUODENOSCOPY (EGD) WITH PROPOFOL N/A 09/21/2015   Procedure: ESOPHAGOGASTRODUODENOSCOPY (EGD) WITH PROPOFOL;  Surgeon: Lucilla Lame, MD;  Location: [facility] ENDOSCOPY;  Service: Endoscopy;  Laterality: N/A;  . ESOPHAGOGASTRODUODENOSCOPY (EGD) WITH PROPOFOL N/A 07/30/2016   Procedure: ESOPHAGOGASTRODUODENOSCOPY (EGD) WITH PROPOFOL;  Surgeon: Jonathon Bellows, MD;  Location: [facility] ENDOSCOPY;  Service: Endoscopy;  Laterality: N/A;  . LEFT HEART CATH Right 01/25/2018   Procedure: Left Heart Cath and Coronary Angiography;   Surgeon: Dionisio David, MD;  Location: [place] [facility];  Service: Cardiovascular;  Laterality: Right;  . LEFT HEART CATH AND CORONARY ANGIOGRAPHY Right 08/12/2016   Procedure: Left Heart Cath and Coronary Angiography;  Surgeon: Dionisio David, MD;  Location: [place] [facility];  Service: Cardiovascular;  Laterality: Right;  . TONSILLECTOMY    . TUBAL LIGATION     Family History  Problem Relation Age of Onset  . Diabetes Mother   . CVA Mother   . Heart disease Father   . Heart disease Sister   . Heart disease Brother   . Heart disease Sister   . Heart disease Sister   . Heart disease Sister   . Heart disease Brother   . Heart disease Brother   . Heart disease Brother    Social History   Tobacco Use  .  Smoking status: Former Smoker    Packs/day: 1.00    Years: 30.00    Pack years: 30.00    Types: Cigarettes    Last attempt to quit: 2006    Years since quitting: 13.9  . Smokeless tobacco: Never Used  . Tobacco comment: smoking cessation materials not required  Substance Use Topics  . Alcohol use: Yes    Alcohol/week: 1.0 standard drinks    Types: 1 Glasses of wine per week    Frequency: Never    Comment: occasionally  . Drug use: No     Office Visit from 03/09/2018 in [place]  AUDIT-C Score  1      Interim medical history since last visit reviewed. Allergies and medications reviewed  Review of Systems Per HPI unless specifically indicated above     Objective:    BP (!) 164/84   Pulse 88   Temp 97.9 F (36.6 C) (Oral)   Ht 5\' 6"  (1.676 m)   Wt 206 lb 14.4 oz (93.8 kg)   SpO2 97%   BMI 33.39 kg/m   Wt Readings from Last 3 Encounters:  03/09/18 206 lb 14.4 oz (93.8 kg)  01/26/18 206 lb 11.2 oz (93.8 kg)  01/22/18 207 lb 3.2 oz (94 kg)    Physical Exam  Constitutional: She appears well-developed and well-nourished. No distress.  HENT:  Head: Normocephalic and atraumatic.  Eyes: EOM are normal. No scleral icterus.  Neck:  No thyromegaly present.  Cardiovascular: Normal rate, regular rhythm and normal heart sounds.  No murmur heard. Pulmonary/Chest: Effort normal and breath sounds normal. No respiratory distress. She has no wheezes.  Abdominal: Soft. Bowel sounds are normal. She exhibits no distension.  Musculoskeletal: She exhibits no edema.  Neurological: She is alert.  Skin: Skin is warm and dry. She is not diaphoretic. No pallor.  Psychiatric: She has a normal mood and affect. Her behavior is normal. Judgment and thought content normal.       Assessment & Plan:   Problem List Items Addressed This Visit      Cardiovascular and Mediastinum   Pulmonary hypertension ([place])    Explained findings on the echo from October 2019; patient Dr. Humphrey Rolls told her she did not need to see a lung doctor      Relevant Medications   amLODipine (NORVASC) 5 MG tablet   Coronary artery calcification seen on CT scan    Managed by cardiologist; on       Relevant Medications   amLODipine (NORVASC) 5 MG tablet   Other Relevant Orders   Lipid panel (Completed)     Endocrine   Subclinical hypothyroidism    Last thyroid in the last 6 weeks, normal        Other   History of gastric ulcer   Relevant Orders   Ambulatory referral to Gastroenterology   H. pylori breath test (Completed)   HLD (hyperlipidemia)   Relevant Medications   amLODipine (NORVASC) 5 MG tablet   Other Relevant Orders   Lipid panel (Completed)   Prediabetes    Last A1c was in October and still prediabetic; encouraged weight loss       Other Visit Diagnoses    LUQ pain    -  Primary   Relevant Orders   Ambulatory referral to Gastroenterology   H. pylori breath test (Completed)   CBC with Differential/Platelet (Completed)   COMPLETE METABOLIC PANEL WITH GFR (Completed)   Anemia, unspecified type  Relevant Orders   Ambulatory referral to Gastroenterology   CBC with Differential/Platelet (Completed)   Change in stool       Relevant  Orders   Ambulatory referral to Gastroenterology   Nausea       Relevant Orders   Ambulatory referral to Gastroenterology       Follow up plan: Return in about 2 weeks (around 03/23/2018) for follow-up visit with Dr. Sanda Klein.  An after-visit summary was printed and given to the patient at [place].  Please see the patient instructions which may contain other information and recommendations beyond what is mentioned above in the assessment and plan.  Meds ordered this encounter  Medications  . amLODipine (NORVASC) 5 MG tablet    Sig: Take 1 tablet (5 mg total) by mouth daily. For blood pressure    Dispense:  30 tablet    Refill:  0    Orders Placed This Encounter  Procedures  . H. pylori breath test  . CBC with Differential/Platelet  . COMPLETE METABOLIC PANEL WITH GFR  . Lipid panel  . Ambulatory referral to Gastroenterology

## 2018-03-10 ENCOUNTER — Other Ambulatory Visit: Payer: Self-pay | Admitting: Family Medicine

## 2018-03-10 ENCOUNTER — Encounter: Payer: Self-pay | Admitting: Family Medicine

## 2018-03-10 DIAGNOSIS — A048 Other specified bacterial intestinal infections: Secondary | ICD-10-CM

## 2018-03-10 LAB — CBC WITH DIFFERENTIAL/PLATELET
Basophils Absolute: 52 cells/uL (ref 0–200)
Basophils Relative: 0.8 %
Eosinophils Absolute: 78 cells/uL (ref 15–500)
Eosinophils Relative: 1.2 %
HCT: 40.3 % (ref 35.0–45.0)
Hemoglobin: 13.4 g/dL (ref 11.7–15.5)
Lymphs Abs: 2626 cells/uL (ref 850–3900)
MCH: 31 pg (ref 27.0–33.0)
MCHC: 33.3 g/dL (ref 32.0–36.0)
MCV: 93.3 fL (ref 80.0–100.0)
MPV: 9.3 fL (ref 7.5–12.5)
Monocytes Relative: 10.2 %
Neutro Abs: 3081 cells/uL (ref 1500–7800)
Neutrophils Relative %: 47.4 %
Platelets: 372 10*3/uL (ref 140–400)
RBC: 4.32 10*6/uL (ref 3.80–5.10)
RDW: 12.1 % (ref 11.0–15.0)
Total Lymphocyte: 40.4 %
WBC mixed population: 663 cells/uL (ref 200–950)
WBC: 6.5 10*3/uL (ref 3.8–10.8)

## 2018-03-10 LAB — COMPLETE METABOLIC PANEL WITHOUT GFR
AG Ratio: 1.7 (calc) (ref 1.0–2.5)
ALT: 11 U/L (ref 6–29)
AST: 21 U/L (ref 10–35)
Albumin: 4.4 g/dL (ref 3.6–5.1)
Alkaline phosphatase (APISO): 61 U/L (ref 33–130)
BUN: 15 mg/dL (ref 7–25)
CO2: 28 mmol/L (ref 20–32)
Calcium: 9.4 mg/dL (ref 8.6–10.4)
Chloride: 103 mmol/L (ref 98–110)
Creat: 0.76 mg/dL (ref 0.50–0.99)
GFR, Est African American: 98 mL/min/1.73m2
GFR, Est Non African American: 85 mL/min/1.73m2
Globulin: 2.6 g/dL (ref 1.9–3.7)
Glucose, Bld: 100 mg/dL — ABNORMAL HIGH (ref 65–99)
Potassium: 4.9 mmol/L (ref 3.5–5.3)
Sodium: 140 mmol/L (ref 135–146)
Total Bilirubin: 0.4 mg/dL (ref 0.2–1.2)
Total Protein: 7 g/dL (ref 6.1–8.1)

## 2018-03-10 LAB — LIPID PANEL
Cholesterol: 190 mg/dL
HDL: 54 mg/dL
LDL Cholesterol (Calc): 107 mg/dL — ABNORMAL HIGH
Non-HDL Cholesterol (Calc): 136 mg/dL — ABNORMAL HIGH
Total CHOL/HDL Ratio: 3.5 (calc)
Triglycerides: 172 mg/dL — ABNORMAL HIGH

## 2018-03-10 LAB — H. PYLORI BREATH TEST: H. pylori Breath Test: DETECTED — AB

## 2018-03-10 MED ORDER — BISMUTH SUBSALICYLATE 262 MG PO CHEW: 524.0000 mg | CHEWABLE_TABLET | Freq: Three times a day (TID) | ORAL | 0 refills | Status: DC

## 2018-03-10 MED ORDER — METRONIDAZOLE 500 MG PO TABS: 500.0000 mg | ORAL_TABLET | Freq: Two times a day (BID) | ORAL | 0 refills | Status: DC

## 2018-03-10 MED ORDER — DOXYCYCLINE HYCLATE 100 MG PO TABS: 100.0000 mg | ORAL_TABLET | Freq: Two times a day (BID) | ORAL | 0 refills | Status: DC

## 2018-03-10 MED ORDER — PANTOPRAZOLE SODIUM 40 MG PO TBEC: 40.0000 mg | DELAYED_RELEASE_TABLET | Freq: Two times a day (BID) | ORAL | 0 refills | Status: DC

## 2018-03-10 NOTE — Progress Notes (Signed)
Treatment for H pylori needed Please contact pharmacy -- she has ranolazine in her medicine list Clarithromycin appears to be contraindicated What is safe for her to take for H pylori treatment that won't interfere with this?  --------------------------------------  Doxycycline was suggested by pharmacist Rxs sent; note to patient thruogh MyChart Ordered confirmation of eradication, H pylori breath test to be done 4 weeks after finishing treatment

## 2018-03-15 ENCOUNTER — Other Ambulatory Visit: Payer: Self-pay | Admitting: Family Medicine

## 2018-03-15 DIAGNOSIS — I10 Essential (primary) hypertension: Secondary | ICD-10-CM

## 2018-03-15 DIAGNOSIS — I25119 Atherosclerotic heart disease of native coronary artery with unspecified angina pectoris: Secondary | ICD-10-CM

## 2018-03-16 ENCOUNTER — Ambulatory Visit: Payer: Medicare Other | Admitting: Gastroenterology

## 2018-03-16 ENCOUNTER — Encounter: Payer: Self-pay | Admitting: Gastroenterology

## 2018-03-16 VITALS — BP 145/83 | HR 72 | Ht 66.0 in | Wt 210.0 lb

## 2018-03-16 DIAGNOSIS — A048 Other specified bacterial intestinal infections: Secondary | ICD-10-CM

## 2018-03-16 DIAGNOSIS — K921 Melena: Secondary | ICD-10-CM | POA: Diagnosis not present

## 2018-03-17 ENCOUNTER — Encounter: Payer: Self-pay | Admitting: Family Medicine

## 2018-03-17 LAB — HEMOGLOBIN: Hemoglobin: 12.6 g/dL (ref 11.1–15.9)

## 2018-03-17 NOTE — Progress Notes (Signed)
Linda Mejia [place]  [place], [place] [address]  Main: [phone]  Fax: [phone]   Gastroenterology Consultation  Referring Provider:     Arnetha Courser, MD Primary Care Physician:  Arnetha Courser, MD Primary Gastroenterologist:  Dr. Vonda Mejia Reason for Consultation:     Positive H. pylori, nausea, abdominal discomfort        HPI:    Chief Complaint  Patient presents with  . Follow-up    LUQ pain, Anemia, hx gastric ulcer, change in stool (dark), nausea    Linda Mejia is a 62 y.o. y/o female referred for consultation & management  by Dr. Sanda Klein, Satira Anis, MD.  Patient with history of anxiety and depression, recently diagnosed with H. pylori based on positive breath test and undergoing triple therapy prescribed by primary care physician, currently taking antibiotics for the same.  Patient reports intermittent nausea, no emesis, left upper quadrant abdominal discomfort intermittently, not exacerbated with meals.  Reports regular bowel movements.  No bright red blood per rectum.    Patient was admitted in October 2019 for chest pain and has history of CAD status post stent.  As per cardiology note and cardiac catheterization during that admission:   "Prox LAD lesion is 50% stenosed.  Ost 1st Mrg to 1st Mrg lesion is 50% stenosed.  Ost RCA to Prox RCA lesion is 100% stenosed.   Cardiac cath is unchanged from 6 months ago, with distal LAD stent moderate disease, and occluded proximal RCA with good collaterals from LAD and normal LVEF."  Patient has followed by Dr. Humphrey Rolls and is being managed with medical therapy.  His cardiac catheterization report states:  " Patient became very diaphoretic and had blood pressure around 865 systolic and was given some fluid in the Cath Lab and afterward diuresed 1100 cc.  Patient had similar finding on cardiac catheterization 6 months back also.  Since ejection fraction is normal and wall motion is normal  and cardiac cath films were reviewed by Dr.Arida as well as Dr. Towanda Malkin, for second opinion and they all agreed that medical treatment is the management.  However she is a smoker and advise getting pulmonary consultation may be COPD as cause of shortness of breath as she was desaturating to 85 prior to cardiac catheterization."  Last EGD April 2018 by Dr. Vicente Males for hematemesis 3 nonbleeding gastric antral ulcers reported, largest 6 mm in size Gastritis and duodenitis reported  June 2017 EGD by Dr. Allen Norris for coffee-ground emesis Gastric antrum erosion, gastric erythema, duodenal erythema, grade a esophagitis reported.  Hiatal hernia was reported  March 2016 EGD by Dr. Gustavo Lah Gastric ulcers reported at that time as well  Colonoscopy 2005 for hematochezia and diarrhea Normal terminal ileum, normal colon reported.  Impression was IBS   Past Medical History:  Diagnosis Date  . Abnormal ankle brachial index (ABI) 12/02/2016  . ADHD (attention deficit hyperactivity disorder)   . Anxiety   . Aortic atherosclerosis ([place]) 12/02/2016   July 2018  . Chronic back pain   . Coronary artery disease   . Herpes genitalis in women   . History of stomach ulcers    x7 this year. Bleeding  . Hypertension   . Prediabetes 12/02/2016  . Pulmonary hypertension ([place]) 03/09/2018   Echo Oct 2019    Past Surgical History:  Procedure Laterality Date  . CARDIAC CATHETERIZATION    . CESAREAN SECTION    . ESOPHAGOGASTRODUODENOSCOPY (EGD) WITH PROPOFOL N/A 09/21/2015  Procedure: ESOPHAGOGASTRODUODENOSCOPY (EGD) WITH PROPOFOL;  Surgeon: Lucilla Lame, MD;  Location: [facility] END[facility]COPY;  Service: Endoscopy;  Laterality: N/A;  . ESOPHAGOGASTRODUODENOSCOPY (EGD) WITH PROPOFOL N/A 07/30/2016   Procedure: ESOPHAGOGASTRODUODENOSCOPY (EGD) WITH PROPOFOL;  Surgeon: Jonathon Bellows, MD;  Location: [facility] END[facility];  Service: Endoscopy;  Laterality: N/A;  . LEFT HEART CATH Right 01/25/2018   Procedure: Left Heart Cath and Coronary  Angiography;  Surgeon: Dionisio David, MD;  Location: [place] [facility];  Service: Cardiovascular;  Laterality: Right;  . LEFT HEART CATH AND CORONARY ANGIOGRAPHY Right 08/12/2016   Procedure: Left Heart Cath and Coronary Angiography;  Surgeon: Dionisio David, MD;  Location: [place] [facility];  Service: Cardiovascular;  Laterality: Right;  . TONSILLECTOMY    . TUBAL LIGATION      Prior to Admission medications   Medication Sig Start Date End Date Taking? Authorizing Provider  acyclovir (ZOVIRAX) 400 MG tablet take 1 tablet by mouth twice a day 11/13/17  Yes Lada, Satira Anis, MD  amLODipine (NORVASC) 5 MG tablet Take 1 tablet (5 mg total) by mouth daily. For blood pressure 03/09/18  Yes Lada, Satira Anis, MD  amphetamine-dextroamphetamine (ADDERALL) 30 MG tablet Take 30 mg by mouth 2 (two) times daily.  12/02/16  Yes Lada, Satira Anis, MD  Ascorbic Acid (VITAMIN C) 100 MG tablet Take 100 mg by mouth daily.   Yes [provider]  aspirin EC 81 MG tablet Take 81 mg by mouth daily.   Yes [provider]  bismuth subsalicylate (PEPTO BISMOL) 262 MG chewable tablet Chew 2 tablets (524 mg total) by mouth 4 (four) times daily -  before meals and at bedtime. 03/10/18  Yes Lada, Satira Anis, MD  cholecalciferol (VITAMIN D) 1000 units tablet Take 1,000 Units by mouth daily.   Yes [provider]  diazepam (VALIUM) 10 MG tablet Take 1 tablet (10 mg total) by mouth every 8 (eight) hours as needed for anxiety. 09/22/15  Yes Fritzi Mandes, MD  doxycycline (VIBRA-TABS) 100 MG tablet Take 1 tablet (100 mg total) by mouth 2 (two) times daily. 03/10/18  Yes Lada, Satira Anis, MD  fluticasone (FLONASE) 50 MCG/ACT nasal spray Place 2 sprays into both nostrils as needed. 09/22/17  Yes Lada, Satira Anis, MD  metoprolol tartrate (LOPRESSOR) 50 MG tablet TAKE 1 TABLET BY MOUTH TWICE A DAY 03/15/18  Yes Lada, Satira Anis, MD  metroNIDAZOLE (FLAGYL) 500 MG tablet Take 1 tablet (500 mg total) by mouth 2 (two)  times daily. No alcohol or cold medicines that contain alcohol while on this med 03/10/18  Yes Lada, Satira Anis, MD  nitroGLYCERIN (NITROSTAT) 0.4 MG SL tablet Place 1 tablet (0.4 mg total) under the tongue every 5 (five) minutes as needed for chest pain. 01/26/18  Yes Mody, Ulice Bold, MD  Omega-3 Fatty Acids (FISH OIL) 1000 MG CAPS Take 1 capsule by mouth daily.   Yes [provider]  PRALUENT 75 MG/ML SOPN Inject 75 mLs as directed every 14 (fourteen) days. 11/25/16  Yes [provider]  triamcinolone cream (KENALOG) 0.1 % Apply 1 application topically 2 (two) times daily. As needed; too strong for face, under arms, in the groin 01/22/18  Yes Lada, Satira Anis, MD  fluticasone furoate-vilanterol (BREO ELLIPTA) 200-25 MCG/INH AEPB Inhale 1 puff into the lungs daily. Patient not taking: Reported on 03/16/2018 01/26/18   Bettey Costa, MD  ipratropium-albuterol (DUONEB) 0.5-2.5 (3) MG/3ML SOLN Take 3 mLs by nebulization every 4 (four) hours as needed. Patient not taking: Reported  on 03/09/2018 01/26/18   Bettey Costa, MD  pantoprazole (PROTONIX) 40 MG tablet Take 1 tablet (40 mg total) by mouth 2 (two) times daily for 14 days. Patient not taking: Reported on 03/16/2018 03/10/18 03/24/18  Arnetha Courser, MD  ranolazine (RANEXA) 500 MG 12 hr tablet Take 2 tablets (1,000 mg total) by mouth 2 (two) times daily for 15 days. 01/22/18 02/06/18  Carrie Mew, MD    Family History  Problem Relation Age of Onset  . Diabetes Mother   . CVA Mother   . Heart disease Father   . Heart disease Sister   . Heart disease Brother   . Heart disease Sister   . Heart disease Sister   . Heart disease Sister   . Heart disease Brother   . Heart disease Brother   . Heart disease Brother      Social History   Tobacco Use  . Smoking status: Former Smoker    Packs/day: 1.00    Years: 30.00    Pack years: 30.00    Types: Cigarettes    Last attempt to quit: 2006    Years since quitting: 13.9  .  Smokeless tobacco: Never Used  . Tobacco comment: smoking cessation materials not required  Substance Use Topics  . Alcohol use: Not Currently    Alcohol/week: 0.0 standard drinks    Frequency: Never  . Drug use: No    Allergies as of 03/16/2018  . (No Known Allergies)    Review of Systems:    All systems reviewed and negative except where noted in HPI.   Physical Exam:  BP (!) 145/83   Pulse 72   Ht 5\' 6"  (1.676 m)   Wt 210 lb (95.3 kg)   BMI 33.89 kg/m  No LMP recorded. Patient is postmenopausal. Psych:  Alert and cooperative. Normal mood and affect. General:   Alert,  Well-developed, well-nourished, pleasant and cooperative in NAD Head:  Normocephalic and atraumatic. Eyes:  Sclera clear, no icterus.   Conjunctiva pink. Ears:  Normal auditory acuity. Nose:  No deformity, discharge, or lesions. Mouth:  No deformity or lesions,oropharynx pink & moist. Neck:  Supple; no masses or thyromegaly. Abdomen:  Normal bowel sounds.  No bruits.  Soft, non-tender and non-distended without masses, hepatosplenomegaly or hernias noted.  No guarding or rebound tenderness.    Msk:  Symmetrical without gross deformities. Good, equal movement & strength bilaterally. Pulses:  Normal pulses noted. Extremities:  No clubbing or edema.  No cyanosis. Neurologic:  Alert and oriented x3;  grossly normal neurologically. Skin:  Intact without significant lesions or rashes. No jaundice. Lymph Nodes:  No significant cervical adenopathy. Psych:  Alert and cooperative. Normal mood and affect.   Labs: CBC    Component Value Date/Time   WBC 6.5 03/09/2018 1531   RBC 4.32 03/09/2018 1531   HGB 12.6 03/16/2018 1534   HCT 40.3 03/09/2018 1531   HCT 37.9 06/05/2015 1203   PLT 372 03/09/2018 1531   PLT 314 06/05/2015 1203   MCV 93.3 03/09/2018 1531   MCV 96 06/05/2015 1203   MCV 95 08/03/2014 2009   MCH 31.0 03/09/2018 1531   MCHC 33.3 03/09/2018 1531   RDW 12.1 03/09/2018 1531   RDW 13.7  06/05/2015 1203   RDW 12.9 08/03/2014 2009   LYMPHSABS 2,626 03/09/2018 1531   LYMPHSABS 2.3 06/05/2015 1203   LYMPHSABS 2.6 08/03/2014 2009   MONOABS 0.9 01/23/2018 1931   MONOABS 0.7 08/03/2014 2009   EOSABS 78 03/09/2018  1531   EOSABS 0.1 06/05/2015 1203   EOSABS 0.2 08/03/2014 2009   BASOSABS 52 03/09/2018 1531   BASOSABS 0.0 06/05/2015 1203   BASOSABS 0.1 08/03/2014 2009   CMP     Component Value Date/Time   NA 140 03/09/2018 1531   NA 144 06/05/2015 1203   NA 142 08/03/2014 2009   K 4.9 03/09/2018 1531   K 3.6 08/03/2014 2009   CL 103 03/09/2018 1531   CL 108 08/03/2014 2009   CO2 28 03/09/2018 1531   CO2 27 08/03/2014 2009   GLUCOSE 100 (H) 03/09/2018 1531   GLUCOSE 102 (H) 08/03/2014 2009   BUN 15 03/09/2018 1531   BUN 13 06/05/2015 1203   BUN 15 08/03/2014 2009   CREATININE 0.76 03/09/2018 1531   CALCIUM 9.4 03/09/2018 1531   CALCIUM 8.3 (L) 08/03/2014 2009   PROT 7.0 03/09/2018 1531   PROT 6.9 08/03/2014 2009   ALBUMIN 3.8 01/23/2018 1931   ALBUMIN 3.9 08/03/2014 2009   AST 21 03/09/2018 1531   AST 24 08/03/2014 2009   ALT 11 03/09/2018 1531   ALT 12 (L) 08/03/2014 2009   ALKPHOS 65 01/23/2018 1931   ALKPHOS 63 08/03/2014 2009   BILITOT 0.4 03/09/2018 1531   BILITOT <0.1 (L) 08/03/2014 2009   GFRNONAA 85 03/09/2018 1531   GFRAA 98 03/09/2018 1531    Imaging Studies: No results found.  Assessment and Plan:   Linda Mejia is a 62 y.o. y/o female has been referred for abdominal discomfort, positive H. pylori breath test  Continue triple therapy Patient has previous history of gastric ulcers as noted in HPI  Given multiple EGDs in the past risk of gastric malignancy is low Primary care provider has already ordered repeat H. pylori testing for evaluation eradication after completion of antibiotics.  This is appropriate.  If her symptoms continue after H. pylori treatment, can consider upper endoscopy  However, given Dr. Laurelyn Sickle note which shows that  patient had hypoxia during the catheterization, she will need cardiac clearance prior to any procedures.  Her last colonoscopy was in 2005 and she is due for screening Can schedule this along with her EGD after cardiac clearance as well  Since her admission for chest pain was very recent, and she is being optimized with medical therapy by her cardiologist, will await 1-2 months prior to endoscopy, to allow for a safer procedure with less complication such as hypoxia that occurred during patient's cardiac catheterization.  Follow-up in clinic closely to reassess symptoms and schedule endoscopy at that time  Avoid NSAIDs Hemoglobin and labs otherwise reassuring  Dr Linda Mejia  Speech recognition software was used to dictate the above note.

## 2018-03-19 ENCOUNTER — Telehealth: Payer: Self-pay | Admitting: Gastroenterology

## 2018-03-19 ENCOUNTER — Encounter: Payer: Self-pay | Admitting: Family Medicine

## 2018-03-19 ENCOUNTER — Telehealth: Payer: Self-pay

## 2018-03-19 MED ORDER — PANTOPRAZOLE SODIUM 40 MG PO TBEC: 40.0000 mg | DELAYED_RELEASE_TABLET | Freq: Two times a day (BID) | ORAL | 0 refills | Status: DC

## 2018-03-19 NOTE — Telephone Encounter (Signed)
Pt states very unhappy with Gi and just wants clarification on her symptoms, states she is not feeling any better? Please advise

## 2018-03-19 NOTE — Telephone Encounter (Signed)
Pt left vm for Lab result she states she is not feeling any better

## 2018-03-19 NOTE — Telephone Encounter (Signed)
I sent her a MyChart message Will send another to verify that she is taking a PPI with the antibiotics

## 2018-03-22 NOTE — Telephone Encounter (Signed)
I spoke with pt about labs of hgb were within normal range. I also went over Dr. Michele Mcalpine notes from her last visit because she had felt like that nothing was done and why she did not go ahead and look down her throat, etc. She felt like the doctor did not know of her gastric ulcers and that she has been doing some research that h-pylori can cause hypoxia and looking at other symptoms. She was apologetic regarding venting her feelings and I said that was okay. That what she needed to do. Pt was reassured. I did advise her that when Dr. Sanda Klein and she felt that her H Pylori infection was cleared up or she needed to be seen sooner to let us know. Pt has f/u appt scheduled for March at this time.

## 2018-03-23 DIAGNOSIS — G4730 Sleep apnea, unspecified: Secondary | ICD-10-CM | POA: Diagnosis not present

## 2018-03-23 DIAGNOSIS — I2510 Atherosclerotic heart disease of native coronary artery without angina pectoris: Secondary | ICD-10-CM | POA: Diagnosis not present

## 2018-03-23 DIAGNOSIS — R0602 Shortness of breath: Secondary | ICD-10-CM | POA: Diagnosis not present

## 2018-03-23 DIAGNOSIS — I10 Essential (primary) hypertension: Secondary | ICD-10-CM | POA: Diagnosis not present

## 2018-03-24 ENCOUNTER — Encounter: Payer: Self-pay | Admitting: Emergency Medicine

## 2018-03-24 ENCOUNTER — Emergency Department
Admission: EM | Admit: 2018-03-24 | Discharge: 2018-03-24 | Disposition: A | Discharge status: Home / Self Care | Payer: Medicare Other | Attending: Student in an Organized Health Care Education/Training Program | Admitting: Student in an Organized Health Care Education/Training Program

## 2018-03-24 ENCOUNTER — Emergency Department: Payer: Medicare Other

## 2018-03-24 ENCOUNTER — Ambulatory Visit: Payer: Self-pay

## 2018-03-24 DIAGNOSIS — M7989 Other specified soft tissue disorders: Secondary | ICD-10-CM

## 2018-03-24 DIAGNOSIS — Z87891 Personal history of nicotine dependence: Secondary | ICD-10-CM | POA: Diagnosis not present

## 2018-03-24 DIAGNOSIS — I10 Essential (primary) hypertension: Secondary | ICD-10-CM | POA: Diagnosis not present

## 2018-03-24 DIAGNOSIS — R600 Localized edema: Secondary | ICD-10-CM | POA: Diagnosis present

## 2018-03-24 DIAGNOSIS — M79606 Pain in leg, unspecified: Secondary | ICD-10-CM

## 2018-03-24 DIAGNOSIS — Z79899 Other long term (current) drug therapy: Secondary | ICD-10-CM | POA: Insufficient documentation

## 2018-03-24 LAB — LACTIC ACID, PLASMA: Lactic Acid, Venous: 0.9 mmol/L (ref 0.5–1.9)

## 2018-03-24 LAB — BASIC METABOLIC PANEL
Anion gap: 11 (ref 5–15)
BUN: 18 mg/dL (ref 8–23)
CO2: 27 mmol/L (ref 22–32)
Calcium: 9.2 mg/dL (ref 8.9–10.3)
Chloride: 99 mmol/L (ref 98–111)
Creatinine, Ser: 0.74 mg/dL (ref 0.44–1.00)
GFR calc Af Amer: >60 mL/min (ref >60)
GFR calc non Af Amer: >60 mL/min (ref >60)
Glucose, Bld: 99 mg/dL (ref 70–99)
Potassium: 4.2 mmol/L (ref 3.5–5.1)
Sodium: 137 mmol/L (ref 135–145)

## 2018-03-24 LAB — CBC
HCT: 39.6 % (ref 36.0–46.0)
Hemoglobin: 12.7 g/dL (ref 12.0–15.0)
MCH: 30.9 pg (ref 26.0–34.0)
MCHC: 32.1 g/dL (ref 30.0–36.0)
MCV: 96.4 fL (ref 80.0–100.0)
Platelets: 359 10*3/uL (ref 150–400)
RBC: 4.11 MIL/uL (ref 3.87–5.11)
RDW: 12.7 % (ref 11.5–15.5)
WBC: 6.7 10*3/uL (ref 4.0–10.5)
nRBC: 0 % (ref 0.0–0.2)

## 2018-03-24 NOTE — ED Notes (Signed)
Pt c/o redness and swelling to BL LE since Tuesday. States she was seen by the cardiologist but did not start taking the medications prescribed by them and does not want to see that doctor again.

## 2018-03-24 NOTE — ED Triage Notes (Signed)
Patient presents to ED via POV from home due to bilateral lower extremity edema, redness and warmth since Monday. Patient endorses pain to legs. Tender on palpation. Patient reports recently dx with h pylori, on 3 different antibiotics for same but unable to state which ones.

## 2018-03-24 NOTE — ED Provider Notes (Addendum)
[facility] Emergency Department Provider Note    First MD Initiated Contact with Patient 03/24/18 1601     (approximate)  I have reviewed the triage vital signs and the nursing notes.   HISTORY  Chief Complaint Leg Swelling    HPI Linda Mejia is a 62 y.o. female with below listed past medical history presents the ER with "not feeling well since August ".  States her primary complaint today is lower extremity edema and swelling.  Patient states that it is more swollen and tender and is never had issues like this before.  States she felt short of breath since August.  Recently treated for H pylori.  Denies any recent trauma.  No numbness or tingling.    Past Medical History:  Diagnosis Date   Abnormal ankle brachial index (ABI) 12/02/2016   ADHD (attention deficit hyperactivity disorder)    Anxiety    Aortic atherosclerosis (HCC) 12/02/2016   July 2018   Chronic back pain    Coronary artery disease    Herpes genitalis in women    History of stomach ulcers    x7 this year. Bleeding   Hypertension    Prediabetes 12/02/2016   Pulmonary hypertension (HCC) 03/09/2018   Echo Oct 2019   Family History  Problem Relation Age of Onset   Diabetes Mother    CVA Mother    Heart disease Father    Heart disease Sister    Heart disease Brother    Heart disease Sister    Heart disease Sister    Heart disease Sister    Heart disease Brother    Heart disease Brother    Heart disease Brother    Past Surgical History:  Procedure Laterality Date   CARDIAC CATHETERIZATION     CESAREAN SECTION     ESOPHAGOGASTRODUODENOSCOPY (EGD) WITH PROPOFOL N/A 09/21/2015   Procedure: ESOPHAGOGASTRODUODENOSCOPY (EGD) WITH PROPOFOL;  Surgeon: Midge Minium, MD;  Location: [facility] ENDOSCOPY;  Service: Endoscopy;  Laterality: N/A;   ESOPHAGOGASTRODUODENOSCOPY (EGD) WITH PROPOFOL N/A 07/30/2016   Procedure: ESOPHAGOGASTRODUODENOSCOPY (EGD) WITH PROPOFOL;  Surgeon: Wyline Mood, MD;   Location: [facility] ENDOSCOPY;  Service: Endoscopy;  Laterality: N/A;   LEFT HEART CATH Right 01/25/2018   Procedure: Left Heart Cath and Coronary Angiography;  Surgeon: Laurier Nancy, MD;  Location: [facility] INVASIVE CV LAB;  Service: Cardiovascular;  Laterality: Right;   LEFT HEART CATH AND CORONARY ANGIOGRAPHY Right 08/12/2016   Procedure: Left Heart Cath and Coronary Angiography;  Surgeon: Laurier Nancy, MD;  Location: [facility] INVASIVE CV LAB;  Service: Cardiovascular;  Laterality: Right;   TONSILLECTOMY     TUBAL LIGATION     Patient Active Problem List   Diagnosis Date Noted   Pulmonary hypertension (HCC) 03/09/2018   Unstable angina (HCC) 01/23/2018   Medication monitoring encounter 03/18/2017   Colon cancer screening 03/18/2017   Coronary artery calcification seen on CT scan 12/02/2016   Aortic atherosclerosis (HCC) 12/02/2016   Abnormal ankle brachial index (ABI) 12/02/2016   Prediabetes 12/02/2016   Fracture of rib 08/28/2016   Chest pain 08/11/2016   Hx of tobacco use, presenting hazards to health 06/09/2016   Degenerative arthritis of knee, bilateral 12/27/2015   Obesity 12/27/2015   GI bleed 09/21/2015   Reflux esophagitis    Other diseases of stomach and duodenum    Multiple joint pain 06/05/2015   Night sweats 06/05/2015   Headache 04/12/2015   Subclinical hypothyroidism 03/26/2015   Herpes simplex type 2 infection 03/21/2015  Rhinitis, allergic 03/21/2015   Cervical pain 09/21/2014   ADHD (attention deficit hyperactivity disorder), inattentive type 09/20/2014   Episodic paroxysmal anxiety disorder 09/20/2014   History of gastric ulcer 09/20/2014    09/20/2014    09/20/2014   HLD (hyperlipidemia) 10/03/2009   Coronary artery disease involving native coronary artery of native heart with angina pectoris (HCC) 10/02/2009   Hypertension goal BP (blood pressure) < 140/90 10/02/2009      Prior to Admission medications   Medication Sig Start Date End Date Taking?  Authorizing Provider  acyclovir (ZOVIRAX) 400 MG tablet take 1 tablet by mouth twice a day 11/13/17   Kerman Passey, MD  amLODipine (NORVASC) 5 MG tablet Take 1 tablet (5 mg total) by mouth daily. For blood pressure 03/09/18   Lada, Janit Bern, MD  amphetamine-dextroamphetamine (ADDERALL) 30 MG tablet Take 30 mg by mouth 2 (two) times daily.  12/02/16   Kerman Passey, MD  Ascorbic Acid (VITAMIN C) 100 MG tablet Take 100 mg by mouth daily.    [provider]  aspirin EC 81 MG tablet Take 81 mg by mouth daily.    [provider]  bismuth subsalicylate (PEPTO BISMOL) 262 MG chewable tablet Chew 2 tablets (524 mg total) by mouth 4 (four) times daily -  before meals and at bedtime. 03/10/18   Kerman Passey, MD  cholecalciferol (VITAMIN D) 1000 units tablet Take 1,000 Units by mouth daily.    [provider]  diazepam (VALIUM) 10 MG tablet Take 1 tablet (10 mg total) by mouth every 8 (eight) hours as needed for anxiety. 09/22/15   Enedina Finner, MD  doxycycline (VIBRA-TABS) 100 MG tablet Take 1 tablet (100 mg total) by mouth 2 (two) times daily. 03/10/18   Kerman Passey, MD  fluticasone (FLONASE) 50 MCG/ACT nasal spray Place 2 sprays into both nostrils as needed. 09/22/17   Lada, Janit Bern, MD  fluticasone furoate-vilanterol (BREO ELLIPTA) 200-25 MCG/INH AEPB Inhale 1 puff into the lungs daily. Patient not taking: Reported on 03/16/2018 01/26/18   Adrian Saran, MD  ipratropium-albuterol (DUONEB) 0.5-2.5 (3) MG/3ML SOLN Take 3 mLs by nebulization every 4 (four) hours as needed. Patient not taking: Reported on 03/09/2018 01/26/18   Adrian Saran, MD  metoprolol tartrate (LOPRESSOR) 50 MG tablet TAKE 1 TABLET BY MOUTH TWICE A DAY 03/15/18   Lada, Janit Bern, MD  metroNIDAZOLE (FLAGYL) 500 MG tablet Take 1 tablet (500 mg total) by mouth 2 (two) times daily. No alcohol or cold medicines that contain alcohol while on this med 03/10/18   Kerman Passey, MD  nitroGLYCERIN (NITROSTAT) 0.4 MG  SL tablet Place 1 tablet (0.4 mg total) under the tongue every 5 (five) minutes as needed for chest pain. 01/26/18   Adrian Saran, MD  Omega-3 Fatty Acids (FISH OIL) 1000 MG CAPS Take 1 capsule by mouth daily.    [provider]  pantoprazole (PROTONIX) 40 MG tablet Take 1 tablet (40 mg total) by mouth 2 (two) times daily. 03/19/18   Lada, Janit Bern, MD  PRALUENT 75 MG/ML SOPN Inject 75 mLs as directed every 14 (fourteen) days. 11/25/16   [provider]  ranolazine (RANEXA) 500 MG 12 hr tablet Take 2 tablets (1,000 mg total) by mouth 2 (two) times daily for 15 days. 01/22/18 02/06/18  Sharman Cheek, MD  triamcinolone cream (KENALOG) 0.1 % Apply 1 application topically 2 (two) times daily. As needed; too strong for face, under arms, in the groin 01/22/18  Kerman Passey, MD    Allergies Patient has no known allergies.    Social History Social History   Tobacco Use   Smoking status: Former Smoker    Packs/day: 1.00    Years: 30.00    Pack years: 30.00    Types: Cigarettes    Last attempt to quit: 2006    Years since quitting: 13.9   Smokeless tobacco: Never Used   Tobacco comment: smoking cessation materials not required  Substance Use Topics   Alcohol use: Not Currently    Alcohol/week: 0.0 standard drinks    Frequency: Never   Drug use: No    Review of Systems Patient denies headaches, rhinorrhea, blurry vision, numbness, shortness of breath, chest pain, edema, cough, abdominal pain, nausea, vomiting, diarrhea, dysuria, fevers, rashes or hallucinations unless otherwise stated above in HPI. ____________________________________________   PHYSICAL EXAM:  VITAL SIGNS: Vitals:   03/24/18 1418  BP: (!) 158/84  Pulse: 84  Resp: 17  Temp: 98 F (36.7 C)  SpO2: 96%    Constitutional: Alert and oriented.  Eyes: Conjunctivae are normal.  Head: Atraumatic. Nose: No congestion/rhinnorhea. Mouth/Throat: Mucous membranes are moist.   Neck: No stridor.  Painless ROM.  Cardiovascular: Normal rate, regular rhythm. Grossly normal heart sounds.  Good peripheral circulation. Respiratory: Normal respiratory effort.  No retractions. Lungs CTAB. Gastrointestinal: Soft and nontender. No distention. No abdominal bruits. No CVA tenderness. Genitourinary:  Musculoskeletal: trace bilateral pitting edema. No deformity or bony ttp  No joint effusions. Neurologic:  Normal speech and language. No gross focal neurologic deficits are appreciated. No facial droop Skin:  Skin is warm, dry and intact.  Psychiatric: Mood and affect are normal. Speech and behavior are normal.  ____________________________________________   LABS (all labs ordered are listed, but only abnormal results are displayed)  Results for orders placed or performed during the hospital encounter of 03/24/18 (from the past 24 hour(s))  CBC     Status: None   Collection Time: 03/24/18  2:25 PM  Result Value Ref Range   WBC 6.7 4.0 - 10.5 K/uL   RBC 4.11 3.87 - 5.11 MIL/uL   Hemoglobin 12.7 12.0 - 15.0 g/dL   HCT 14.7 82.9 - 56.2 %   MCV 96.4 80.0 - 100.0 fL   MCH 30.9 26.0 - 34.0 pg   MCHC 32.1 30.0 - 36.0 g/dL   RDW 13.0 86.5 - 78.4 %   Platelets 359 150 - 400 K/uL   nRBC 0.0 0.0 - 0.2 %  Basic metabolic panel     Status: None   Collection Time: 03/24/18  2:25 PM  Result Value Ref Range   Sodium 137 135 - 145 mmol/L   Potassium 4.2 3.5 - 5.1 mmol/L   Chloride 99 98 - 111 mmol/L   CO2 27 22 - 32 mmol/L   Glucose, Bld 99 70 - 99 mg/dL   BUN 18 8 - 23 mg/dL   Creatinine, Ser 6.96 0.44 - 1.00 mg/dL   Calcium 9.2 8.9 - 29.5 mg/dL   GFR calc non Af Amer >60 >60 mL/min   GFR calc Af Amer >60 >60 mL/min   Anion gap 11 5 - 15  Lactic acid, plasma     Status: None   Collection Time: 03/24/18  2:25 PM  Result Value Ref Range   Lactic Acid, Venous 0.9 0.5 - 1.9 mmol/L   ____________________________________________ ____________________________________________  RADIOLOGY  I  personally reviewed all radiographic images ordered to evaluate for the above acute complaints  and reviewed radiology reports and findings.  These findings were personally discussed with the patient.  Please see medical record for radiology report.  ____________________________________________   PROCEDURES  Procedure(s) performed:  Procedures    Critical Care performed: no ____________________________________________   INITIAL IMPRESSION / ASSESSMENT AND PLAN / ED COURSE  Pertinent labs & imaging results that were available during my care of the patient were reviewed by me and considered in my medical decision making (see chart for details).   DDX: edema, DVT, proteinuria, cellulitis, lymphedema  Ravenne KHYLI WEYER is a 62 y.o. who presents to the ED with was as described above.  Blood work sent in triage with by differential was reassuring.  Ultrasound shows no evidence of DVT.  No bony tenderness palpation.  I recommended chest x-ray as well as x-ray of the knee as well as checking a urine to evaluate for the but complaints patient states that she would prefer to follow-up with her PCP.  Given duration of symptoms I think this is reasonable.  Patient demonstrated understanding that not having these tests done would limit our ability to evaluate underlying medical conditions and candid result and delay in diagnosis and patient therapy.  Patient not hypoxic, able to ambulate with steady gait.       As part of my medical decision making, I reviewed the following data within the electronic MEDICAL RECORD NUMBER Nursing notes reviewed and incorporated, Labs reviewed, notes from prior ED visits . ____________________________________________   FINAL CLINICAL IMPRESSION(S) / ED DIAGNOSES  Final diagnoses:  Leg swelling      NEW MEDICATIONS STARTED DURING THIS VISIT:  New Prescriptions   No medications on file     Note:  This document was prepared using Dragon voice recognition software and  may include unintentional dictation errors.    Willy Eddy, MD 03/24/18 1641    Willy Eddy, MD 08/07/21 1504

## 2018-03-24 NOTE — Telephone Encounter (Signed)
Patient called in with c/o "leg swelling." She says "the swelling started on Monday, that I noticed, to both legs, but the left is more than the right. The swelling is up to my knee and a little above my knee. I have redness to my left leg and foot, warm to the touch and painful to touch. I don't have a fever." I asked when did the redness start, she says "Monday when it was swelling." I asked what does the redness look like, she says "it's just red, hot to the touch. I don't know if it looks infected or not." I asked about the pain, she says "resting a 5, but when I get up it's an 8." I asked about other symptoms, she denies chest pain, but says she is SOB, but has been that way since August and it's not any worse since the leg swelling. According to protocol, see PCP within 24 hours, appointment already scheduled for tomorrow with Dr. Sanda Klein. Patient asks if she can be seen today by either Dr. Sanda Klein or Raquel Sarna. I advised no openings with either provider, but I could schedule with Suezanne Cheshire, NP, patient says "no, I'll just come tomorrow and if the pain, swelling gets worse, I will go to the hospital." I asked has the swelling increased since Monday, she denies. Care advice given, patient verbalized understanding.   Reason for Disposition . [1] MODERATE leg swelling (e.g., swelling extends up to knees) AND [2] new onset or worsening  Answer Assessment - Initial Assessment Questions 1. ONSET: "When did the swelling start?" (e.g., minutes, hours, days)     Noticed it Monday 2. LOCATION: "What part of the leg is swollen?"  "Are both legs swollen or just one leg?"     Left leg swollen, painful to touch, red, hot; right leg swollen 3. SEVERITY: "How bad is the swelling?" (e.g., localized; mild, moderate, severe)  - Localized - small area of swelling localized to one leg  - MILD pedal edema - swelling limited to foot and ankle, pitting edema < 1/4 inch (6 mm) deep, rest and elevation eliminate most or all  swelling  - MODERATE edema - swelling of lower leg to knee, pitting edema > 1/4 inch (6 mm) deep, rest and elevation only partially reduce swelling  - SEVERE edema - swelling extends above knee, facial or hand swelling present      Severe above the left knee cap 4. REDNESS: "Does the swelling look red or infected?"     Yes red, hot to touch 5. PAIN: "Is the swelling painful to touch?" If so, ask: "How painful is it?"   (Scale 1-10; mild, moderate or severe)    5 resting; 8 when standing 6. FEVER: "Do you have a fever?" If so, ask: "What is it, how was it measured, and when did it start?"      No 7. CAUSE: "What do you think is causing the leg swelling?"     I don't know 8. MEDICAL HISTORY: "Do you have a history of heart failure, kidney disease, liver failure, or cancer?"     Heart disease 9. RECURRENT SYMPTOM: "Have you had leg swelling before?" If so, ask: "When was the last time?" "What happened that time?"     No 10. OTHER SYMPTOMS: "Do you have any other symptoms?" (e.g., chest pain, difficulty breathing)      SOB since August-not related to swelling 11. PREGNANCY: "Is there any chance you are pregnant?" "When was your last menstrual period?"  No  Protocols used: LEG SWELLING AND EDEMA-A-AH

## 2018-03-24 NOTE — ED Notes (Signed)
AAOx3.  Skin warm and dry.  NAD 

## 2018-03-24 NOTE — Telephone Encounter (Signed)
She needs to go NOW to the emergency department She could have a DVT or cellulitis

## 2018-03-24 NOTE — Telephone Encounter (Signed)
Pt stated that she will go the the ER to be evaluated.

## 2018-03-25 ENCOUNTER — Ambulatory Visit (INDEPENDENT_AMBULATORY_CARE_PROVIDER_SITE_OTHER): Payer: Medicare Other | Admitting: Family Medicine

## 2018-03-25 ENCOUNTER — Encounter: Payer: Self-pay | Admitting: Family Medicine

## 2018-03-25 VITALS — BP 126/70 | HR 84 | Temp 98.1°F | Ht 66.0 in | Wt 213.6 lb

## 2018-03-25 DIAGNOSIS — R1012 Left upper quadrant pain: Secondary | ICD-10-CM | POA: Diagnosis not present

## 2018-03-25 DIAGNOSIS — L539 Erythematous condition, unspecified: Secondary | ICD-10-CM | POA: Diagnosis not present

## 2018-03-25 DIAGNOSIS — M5442 Lumbago with sciatica, left side: Secondary | ICD-10-CM

## 2018-03-25 NOTE — Patient Instructions (Signed)
Please have the labs done as soon as possible Go to the [facility] during regular hours The hospital lab is open 24/7 If you go to the hospital after 6 pm, just check in at the ER but not as a patient, just tell them you are there for blood work

## 2018-03-25 NOTE — Progress Notes (Signed)
BP 126/70   Pulse 84   Temp 98.1 F (36.7 C) (Oral)   Ht _0  (1.676 m)   Wt 213 lb 9.6 oz (96.9 kg)   SpO2 96%   BMI 34.48 kg/m    Subjective:    Patient ID: Linda Mejia, female    DOB: 1955-09-05, 62 y.o.   MRN: [id number]  HPI: Linda Mejia is a 62 y.o. female  Chief Complaint  Patient presents with  . Follow-up    Went to ER yesterday  . Leg Swelling    reddness and hot to touch, went to ER Negative for clots    HPI Patient is here for ER visit She was complaining of redness and swelling in her legs She went to the ER yesterday; checked her out for a DVT and the scan was negative CBC, BMP, and lactic acid all normal  Bright red skin on the legs and indention over the distal thighs; started Monday Bright red cheeks New rash o the legs; right anterior leg and left posterior leg She is having new back pain, lower back, left shoulder pain  US venous imaging bilateral lower: IMPRESSION: No evidence of DVT within either lower extremity.   Electronically Signed   By: Sandi Mariscal M.D.   On: 03/24/2018 15:35  Still having abdominal discomfort; saw GI doctor already and she was not aware that the GI doctor was planning to do more if her symptoms persisted  Depression screen [facility] 2/9 03/09/2018 01/22/2018 12/15/2017 12/11/2017 06/19/2017  Decreased Interest 1 3 0 0 0  Down, Depressed, Hopeless 0 3 0 0 0  PHQ - 2 Score 1 6 0 0 0  Altered sleeping 3 0 0 0 -  Tired, decreased energy 3 3 0 0 -  Change in appetite 1 0 0 0 -  Feeling bad or failure about yourself  1 3 0 0 -  Trouble concentrating 0 0 0 0 -  Moving slowly or fidgety/restless 0 0 0 0 -  Suicidal thoughts 0 0 0 0 -  PHQ-9 Score 9 12 0 0 -  Difficult doing work/chores Somewhat difficult Very difficult Not difficult at all Not difficult at all -   Fall Risk  03/09/2018 01/22/2018 12/15/2017 12/11/2017 06/19/2017  Falls in the past year? 0 No No No No  Number falls in past yr: 0 - - - -  Injury with Fall? - - - - -   Comment - - - - -  Risk for fall due to : - - Impaired vision;History of fall(s);Medication side effect - -  Risk for fall due to: Comment - - wears eyeglasses; back pain; fell in tub - -    Relevant past medical, surgical, family and social history reviewed Past Medical History:  Diagnosis Date  . Abnormal ankle brachial index (ABI) 12/02/2016  . ADHD (attention deficit hyperactivity disorder)   . Anxiety   . Aortic atherosclerosis ([place]) 12/02/2016   July 2018  . Chronic back pain   . Coronary artery disease   . Herpes genitalis in women   . History of stomach ulcers    x7 this year. Bleeding  . Hypertension   . Prediabetes 12/02/2016  . Pulmonary hypertension ([place]) 03/09/2018   Echo Oct 2019   Past Surgical History:  Procedure Laterality Date  . CARDIAC CATHETERIZATION    . CESAREAN SECTION    . ESOPHAGOGASTRODUODENOSCOPY (EGD) WITH PROPOFOL N/A 09/21/2015   Procedure: ESOPHAGOGASTRODUODENOSCOPY (EGD) WITH PROPOFOL;  Surgeon:  Lucilla Lame, MD;  Location: [facility] ENDOSCOPY;  Service: Endoscopy;  Laterality: N/A;  . ESOPHAGOGASTRODUODENOSCOPY (EGD) WITH PROPOFOL N/A 07/30/2016   Procedure: ESOPHAGOGASTRODUODENOSCOPY (EGD) WITH PROPOFOL;  Surgeon: Jonathon Bellows, MD;  Location: [facility] ENDOSCOPY;  Service: Endoscopy;  Laterality: N/A;  . LEFT HEART CATH Right 01/25/2018   Procedure: Left Heart Cath and Coronary Angiography;  Surgeon: Dionisio David, MD;  Location: [place] [facility];  Service: Cardiovascular;  Laterality: Right;  . LEFT HEART CATH AND CORONARY ANGIOGRAPHY Right 08/12/2016   Procedure: Left Heart Cath and Coronary Angiography;  Surgeon: Dionisio David, MD;  Location: [place] [facility];  Service: Cardiovascular;  Laterality: Right;  . TONSILLECTOMY    . TUBAL LIGATION     Family History  Problem Relation Age of Onset  . Diabetes Mother   . CVA Mother   . Heart disease Father   . Heart disease Sister   . Heart disease Brother   . Heart disease Sister   . Heart disease  Sister   . Heart disease Sister   . Heart disease Brother   . Heart disease Brother   . Heart disease Brother    Social History   Tobacco Use  . Smoking status: Former Smoker    Packs/day: 1.00    Years: 30.00    Pack years: 30.00    Types: Cigarettes    Last attempt to quit: 2006    Years since quitting: 13.9  . Smokeless tobacco: Never Used  . Tobacco comment: smoking cessation materials not required  Substance Use Topics  . Alcohol use: Not Currently    Alcohol/week: 0.0 standard drinks    Frequency: Never  . Drug use: No     Office Visit from 03/09/2018 in [facility]  AUDIT-C Score  1      Interim medical history since last visit reviewed. Allergies and medications reviewed  Review of Systems Per HPI unless specifically indicated above     Objective:    BP 126/70   Pulse 84   Temp 98.1 F (36.7 C) (Oral)   Ht _0  (1.676 m)   Wt 213 lb 9.6 oz (96.9 kg)   SpO2 96%   BMI 34.48 kg/m   Wt Readings from Last 3 Encounters:  03/26/18 206 lb (93.4 kg)  03/25/18 213 lb 9.6 oz (96.9 kg)  03/24/18 220 lb (99.8 kg)    Physical Exam Constitutional:      Appearance: She is well-developed.  Eyes:     General: No scleral icterus. Cardiovascular:     Rate and Rhythm: Normal rate.  Pulmonary:     Effort: Pulmonary effort is normal.  Abdominal:     General: There is no distension.  Musculoskeletal:       Legs:     Comments: indentions in the anterior aspects of both thighs  Skin:    Comments: Bright red, hot pink coloration and increased warmth to the skin over both cheeks, as well as the lower legs; no vesicles; symmetric  Psychiatric:        Behavior: Behavior normal.     Results for orders placed or performed during the hospital encounter of 03/24/18  CBC  Result Value Ref Range   WBC 6.7 4.0 - 10.5 K/uL   RBC 4.11 3.87 - 5.11 MIL/uL   Hemoglobin 12.7 12.0 - 15.0 g/dL   HCT 39.6 36.0 - 46.0 %   MCV 96.4 80.0 - 100.0 fL   MCH  30.9 26.0 -  34.0 pg   MCHC 32.1 30.0 - 36.0 g/dL   RDW 12.7 11.5 - 15.5 %   Platelets 359 150 - 400 K/uL   nRBC 0.0 0.0 - 0.2 %  Basic metabolic panel  Result Value Ref Range   Sodium 137 135 - 145 mmol/L   Potassium 4.2 3.5 - 5.1 mmol/L   Chloride 99 98 - 111 mmol/L   CO2 27 22 - 32 mmol/L   Glucose, Bld 99 70 - 99 mg/dL   BUN 18 8 - 23 mg/dL   Creatinine, Ser 0.74 0.44 - 1.00 mg/dL   Calcium 9.2 8.9 - 10.3 mg/dL   GFR calc non Af Amer >60 >60 mL/min   GFR calc Af Amer >60 >60 mL/min   Anion gap 11 5 - 15  Lactic acid, plasma  Result Value Ref Range   Lactic Acid, Venous 0.9 0.5 - 1.9 mmol/L      Assessment & Mejia:   Problem List Items Addressed This Visit    None    Visit Diagnoses    Erythroderma    -  Primary   very unusual appearance; does not look c/w cellulitis or DVT; will get labs and urgent referral to DERM   Relevant Orders   ANA w/Reflex   ANCA Titers ( [facility] [facility])   Complement, total   Sed Rate (ESR) (Completed)   C-reactive protein   Ambulatory referral to Dermatology   Acute left-sided low back pain with left-sided sciatica       will check labs   Relevant Orders   ANA w/Reflex   Complement, total   LUQ pain       will check labs; encoruaged patient to contact her GI doctor for further work-up, as her note was descriptive about her plans; she will call   Relevant Orders   Hepatic function panel (Completed)       Follow up Mejia: No follow-ups on file.  An after-visit summary was printed and given to the patient at [place].  Please see the patient instructions which may contain other information and recommendations beyond what is mentioned above in the assessment and Mejia.  No orders of the defined types were placed in this encounter.   Orders Placed This Encounter  Procedures  . ANA w/Reflex  . ANCA Tit[place] ( [facility] [facility])  . Complement, total  . Hepatic function panel  . Sed Rate (ESR)  .  C-reactive protein  . Ambulatory referral to Dermatology

## 2018-03-26 ENCOUNTER — Other Ambulatory Visit: Payer: Self-pay

## 2018-03-26 ENCOUNTER — Emergency Department: Payer: Medicare Other

## 2018-03-26 ENCOUNTER — Telehealth: Payer: Self-pay

## 2018-03-26 ENCOUNTER — Emergency Department
Admission: EM | Admit: 2018-03-26 | Discharge: 2018-03-26 | Disposition: A | Discharge status: Home / Self Care | Payer: Medicare Other | Attending: Emergency Medicine | Admitting: Emergency Medicine

## 2018-03-26 ENCOUNTER — Encounter: Payer: Self-pay | Admitting: Emergency Medicine

## 2018-03-26 ENCOUNTER — Other Ambulatory Visit
Admission: RE | Admit: 2018-03-26 | Discharge: 2018-03-26 | Disposition: A | Discharge status: Home / Self Care | Payer: Medicare Other | Source: Ambulatory Visit | Attending: Family Medicine | Admitting: Family Medicine

## 2018-03-26 DIAGNOSIS — M5442 Lumbago with sciatica, left side: Secondary | ICD-10-CM

## 2018-03-26 DIAGNOSIS — Z7982 Long term (current) use of aspirin: Secondary | ICD-10-CM | POA: Insufficient documentation

## 2018-03-26 DIAGNOSIS — R1012 Left upper quadrant pain: Secondary | ICD-10-CM

## 2018-03-26 DIAGNOSIS — R079 Chest pain, unspecified: Secondary | ICD-10-CM | POA: Diagnosis present

## 2018-03-26 DIAGNOSIS — L539 Erythematous condition, unspecified: Secondary | ICD-10-CM

## 2018-03-26 DIAGNOSIS — I259 Chronic ischemic heart disease, unspecified: Secondary | ICD-10-CM | POA: Insufficient documentation

## 2018-03-26 DIAGNOSIS — E039 Hypothyroidism, unspecified: Secondary | ICD-10-CM | POA: Diagnosis not present

## 2018-03-26 DIAGNOSIS — Z87891 Personal history of nicotine dependence: Secondary | ICD-10-CM | POA: Insufficient documentation

## 2018-03-26 DIAGNOSIS — R531 Weakness: Secondary | ICD-10-CM | POA: Diagnosis not present

## 2018-03-26 DIAGNOSIS — R7303 Prediabetes: Secondary | ICD-10-CM | POA: Diagnosis not present

## 2018-03-26 DIAGNOSIS — R0789 Other chest pain: Secondary | ICD-10-CM | POA: Insufficient documentation

## 2018-03-26 DIAGNOSIS — R609 Edema, unspecified: Secondary | ICD-10-CM | POA: Diagnosis not present

## 2018-03-26 DIAGNOSIS — Z79899 Other long term (current) drug therapy: Secondary | ICD-10-CM | POA: Insufficient documentation

## 2018-03-26 DIAGNOSIS — R600 Localized edema: Secondary | ICD-10-CM | POA: Diagnosis not present

## 2018-03-26 DIAGNOSIS — R0602 Shortness of breath: Secondary | ICD-10-CM | POA: Diagnosis not present

## 2018-03-26 DIAGNOSIS — I10 Essential (primary) hypertension: Secondary | ICD-10-CM | POA: Diagnosis not present

## 2018-03-26 LAB — BASIC METABOLIC PANEL
Anion gap: 9 (ref 5–15)
BUN: 17 mg/dL (ref 8–23)
CO2: 28 mmol/L (ref 22–32)
Calcium: 9 mg/dL (ref 8.9–10.3)
Chloride: 101 mmol/L (ref 98–111)
Creatinine, Ser: 0.8 mg/dL (ref 0.44–1.00)
GFR calc Af Amer: >60 mL/min (ref >60)
GFR calc non Af Amer: >60 mL/min (ref >60)
Glucose, Bld: 110 mg/dL — ABNORMAL HIGH (ref 70–99)
Potassium: 3.7 mmol/L (ref 3.5–5.1)
Sodium: 138 mmol/L (ref 135–145)

## 2018-03-26 LAB — HEPATIC FUNCTION PANEL
ALT: 16 U/L (ref 0–44)
ALT: 15 U/L (ref 0–44)
AST: 25 U/L (ref 15–41)
AST: 22 U/L (ref 15–41)
Albumin: 4.4 g/dL (ref 3.5–5.0)
Albumin: 4.2 g/dL (ref 3.5–5.0)
Alkaline Phosphatase: 52 U/L (ref 38–126)
Alkaline Phosphatase: 47 U/L (ref 38–126)
Bilirubin, Direct: <0.1 mg/dL (ref 0.0–0.2)
Bilirubin, Direct: <0.1 mg/dL (ref 0.0–0.2)
Total Bilirubin: 0.6 mg/dL (ref 0.3–1.2)
Total Bilirubin: 0.6 mg/dL (ref 0.3–1.2)
Total Protein: 7.5 g/dL (ref 6.5–8.1)
Total Protein: 7 g/dL (ref 6.5–8.1)

## 2018-03-26 LAB — CBC WITH DIFFERENTIAL/PLATELET
Abs Immature Granulocytes: 0.03 10*3/uL (ref 0.00–0.07)
Basophils Absolute: 0.1 10*3/uL (ref 0.0–0.1)
Basophils Relative: 1 %
Eosinophils Absolute: 0.1 10*3/uL (ref 0.0–0.5)
Eosinophils Relative: 2 %
HCT: 38.4 % (ref 36.0–46.0)
Hemoglobin: 12.4 g/dL (ref 12.0–15.0)
Immature Granulocytes: 0 %
Lymphocytes Relative: 36 %
Lymphs Abs: 2.5 10*3/uL (ref 0.7–4.0)
MCH: 31.1 pg (ref 26.0–34.0)
MCHC: 32.3 g/dL (ref 30.0–36.0)
MCV: 96.2 fL (ref 80.0–100.0)
Monocytes Absolute: 0.6 10*3/uL (ref 0.1–1.0)
Monocytes Relative: 9 %
Neutro Abs: 3.6 10*3/uL (ref 1.7–7.7)
Neutrophils Relative %: 52 %
Platelets: 340 10*3/uL (ref 150–400)
RBC: 3.99 MIL/uL (ref 3.87–5.11)
RDW: 12.6 % (ref 11.5–15.5)
WBC: 7 10*3/uL (ref 4.0–10.5)
nRBC: 0 % (ref 0.0–0.2)

## 2018-03-26 LAB — BRAIN NATRIURETIC PEPTIDE: B Natriuretic Peptide: 85 pg/mL (ref 0.0–100.0)

## 2018-03-26 LAB — C-REACTIVE PROTEIN
CRP: <0.8 mg/dL (ref <1.0)
CRP: <0.8 mg/dL (ref <1.0)

## 2018-03-26 LAB — BLOOD GAS, VENOUS
Patient temperature: 37
pCO2, Ven: 53 mmHg (ref 44.0–60.0)
pH, Ven: 7.38 (ref 7.250–7.430)
pO2, Ven: 37 mmHg (ref 32.0–45.0)

## 2018-03-26 LAB — SEDIMENTATION RATE
Sed Rate: 16 mm/hr (ref 0–30)
Sed Rate: 14 mm/hr (ref 0–30)

## 2018-03-26 LAB — TROPONIN I
Troponin I: <0.03 ng/mL (ref <0.03)
Troponin I: <0.03 ng/mL (ref <0.03)

## 2018-03-26 MED ORDER — ONDANSETRON HCL 4 MG/2ML IJ SOLN
4.0000 mg | Freq: Once | INTRAMUSCULAR | Status: AC
  Administered 2018-03-26: 4 mg via INTRAVENOUS
  Filled 2018-03-26: qty 2

## 2018-03-26 MED ORDER — MORPHINE SULFATE (PF) 4 MG/ML IV SOLN
4.0000 mg | Freq: Once | INTRAVENOUS | Status: AC
  Administered 2018-03-26: 4 mg via INTRAVENOUS
  Filled 2018-03-26: qty 1

## 2018-03-26 NOTE — ED Provider Notes (Signed)
[facility] Emergency Department Provider Note ____________________________________________   First MD Initiated Contact with Patient 03/26/18 1006     (approximate)  I have reviewed the triage vital signs and the nursing notes.   HISTORY  Chief Complaint Chest Pain    HPI Linda Mejia is a 62 y.o. female with PMH as noted below who presents with multiple complaints, but with the primary complaint today being chest pain.  She reports central chest pain, acute onset around 830 this morning described as both sharp and pressure-like, and nonexertional.  In addition, the patient reports bilateral lower extremity swelling for approximately last week, some abdominal pain and nausea, as well as generalized weakness and malaise.  The patient saw her doctor yesterday and states she actually came to the hospital this morning to have labs drawn but there was some kind of clerical mixup and she was not able to have it done.  She was also seen in the ED 2 days ago but left without all of the work-up that had been suggested.  Past Medical History:  Diagnosis Date  . Abnormal ankle brachial index (ABI) 12/02/2016  . ADHD (attention deficit hyperactivity disorder)   . Anxiety   . Aortic atherosclerosis ([place]) 12/02/2016   July 2018  . Chronic back pain   . Coronary artery disease   . Herpes genitalis in women   . History of stomach ulcers    x7 this year. Bleeding  . Hypertension   . Prediabetes 12/02/2016  . Pulmonary hypertension ([place]) 03/09/2018   Echo Oct 2019    Patient Active Problem List   Diagnosis Date Noted  . Pulmonary hypertension ([place]) 03/09/2018  . Unstable angina ([place]) 01/23/2018  . Medication monitoring encounter 03/18/2017  . Colon cancer screening 03/18/2017  . Coronary artery calcification seen on CT scan 12/02/2016  . Aortic atherosclerosis ([place]) 12/02/2016  . Abnormal ankle brachial index (ABI) 12/02/2016  . Prediabetes 12/02/2016  .  Fracture of rib 08/28/2016  . Chest pain 08/11/2016  . Hx of tobacco use, presenting hazards to health 06/09/2016  . Degenerative arthritis of knee, bilateral 12/27/2015  . Obesity 12/27/2015  . GI bleed 09/21/2015  . Reflux esophagitis   . Other diseases of stomach and duodenum   . Multiple joint pain 06/05/2015  . Night sweats 06/05/2015  . Headache 04/12/2015  . Subclinical hypothyroidism 03/26/2015  . Herpes simplex type 2 infection 03/21/2015  . Rhinitis, allergic 03/21/2015  . Cervical pain 09/21/2014  . ADHD (attention deficit hyperactivity disorder), inattentive type 09/20/2014  . Episodic paroxysmal anxiety disorder 09/20/2014  . History of gastric ulcer 09/20/2014  . Abuse, drug or alcohol ([place]) 09/20/2014  . Substance abuse ([place]) 09/20/2014  . HLD (hyperlipidemia) 10/03/2009  . Coronary artery disease involving native coronary artery of native heart with angina pectoris ([place]) 10/02/2009  . Hypertension goal BP (blood pressure) < 140/90 10/02/2009    Past Surgical History:  Procedure Laterality Date  . CARDIAC CATHETERIZATION    . CESAREAN SECTION    . ESOPHAGOGASTRODUODENOSCOPY (EGD) WITH PROPOFOL N/A 09/21/2015   Procedure: ESOPHAGOGASTRODUODENOSCOPY (EGD) WITH PROPOFOL;  Surgeon: Lucilla Lame, MD;  Location: [facility] ENDOSCOPY;  Service: Endoscopy;  Laterality: N/A;  . ESOPHAGOGASTRODUODENOSCOPY (EGD) WITH PROPOFOL N/A 07/30/2016   Procedure: ESOPHAGOGASTRODUODENOSCOPY (EGD) WITH PROPOFOL;  Surgeon: Jonathon Bellows, MD;  Location: [facility] ENDOSCOPY;  Service: Endoscopy;  Laterality: N/A;  . LEFT HEART CATH Right 01/25/2018   Procedure: Left Heart Cath and Coronary Angiography;  Surgeon: Dionisio David, MD;  Location: [place] [facility];  Service: Cardiovascular;  Laterality: Right;  . LEFT HEART CATH AND CORONARY ANGIOGRAPHY Right 08/12/2016   Procedure: Left Heart Cath and Coronary Angiography;  Surgeon: Dionisio David, MD;  Location: [place] [facility];  Service: Cardiovascular;   Laterality: Right;  . TONSILLECTOMY    . TUBAL LIGATION      Prior to Admission medications   Medication Sig Start Date End Date Taking? Authorizing Provider  acyclovir (ZOVIRAX) 400 MG tablet take 1 tablet by mouth twice a day 11/13/17  Yes Lada, Satira Anis, MD  amLODipine (NORVASC) 5 MG tablet Take 1 tablet (5 mg total) by mouth daily. For blood pressure 03/09/18  Yes Lada, Satira Anis, MD  amphetamine-dextroamphetamine (ADDERALL) 30 MG tablet Take 30 mg by mouth 2 (two) times daily.  12/02/16  Yes Lada, Satira Anis, MD  Ascorbic Acid (VITAMIN C) 100 MG tablet Take 100 mg by mouth daily.   Yes [provider]  aspirin EC 81 MG tablet Take 81 mg by mouth daily.   Yes [provider]  cholecalciferol (VITAMIN D) 1000 units tablet Take 1,000 Units by mouth daily.   Yes [provider]  diazepam (VALIUM) 10 MG tablet Take 1 tablet (10 mg total) by mouth every 8 (eight) hours as needed for anxiety. 09/22/15  Yes Fritzi Mandes, MD  fluticasone (FLONASE) 50 MCG/ACT nasal spray Place 2 sprays into both nostrils as needed. 09/22/17  Yes Lada, Satira Anis, MD  metoprolol tartrate (LOPRESSOR) 50 MG tablet TAKE 1 TABLET BY MOUTH TWICE A DAY 03/15/18  Yes Lada, Satira Anis, MD  Omega-3 Fatty Acids (FISH OIL) 1000 MG CAPS Take 1 capsule by mouth daily.   Yes [provider]  PRALUENT 75 MG/ML SOPN Inject 75 mLs as directed every 14 (fourteen) days. 11/25/16  Yes [provider]  bismuth subsalicylate (PEPTO BISMOL) 262 MG chewable tablet Chew 2 tablets (524 mg total) by mouth 4 (four) times daily -  before meals and at bedtime. Patient not taking: Reported on 03/26/2018 03/10/18   Arnetha Courser, MD  doxycycline (VIBRA-TABS) 100 MG tablet Take 1 tablet (100 mg total) by mouth 2 (two) times daily. Patient not taking: Reported on 03/26/2018 03/10/18   Arnetha Courser, MD  fluticasone furoate-vilanterol (BREO ELLIPTA) 200-25 MCG/INH AEPB Inhale 1 puff into the lungs  daily. Patient not taking: Reported on 03/16/2018 01/26/18   Bettey Costa, MD  ipratropium-albuterol (DUONEB) 0.5-2.5 (3) MG/3ML SOLN Take 3 mLs by nebulization every 4 (four) hours as needed. Patient not taking: Reported on 03/09/2018 01/26/18   Bettey Costa, MD  metroNIDAZOLE (FLAGYL) 500 MG tablet Take 1 tablet (500 mg total) by mouth 2 (two) times daily. No alcohol or cold medicines that contain alcohol while on this med Patient not taking: Reported on 03/26/2018 03/10/18   Arnetha Courser, MD  nitroGLYCERIN (NITROSTAT) 0.4 MG SL tablet Place 1 tablet (0.4 mg total) under the tongue every 5 (five) minutes as needed for chest pain. Patient not taking: Reported on 03/26/2018 01/26/18   Bettey Costa, MD  pantoprazole (PROTONIX) 40 MG tablet Take 1 tablet (40 mg total) by mouth 2 (two) times daily. Patient not taking: Reported on 03/26/2018 03/19/18   Arnetha Courser, MD  ranolazine (RANEXA) 500 MG 12 hr tablet Take 2 tablets (1,000 mg total) by mouth 2 (two) times daily for 15 days. 01/22/18 02/06/18  Carrie Mew, MD  triamcinolone cream (KENALOG) 0.1 % Apply 1 application topically 2 (two) times  daily. As needed; too strong for face, under arms, in the groin Patient not taking: Reported on 03/25/2018 01/22/18   Arnetha Courser, MD    Allergies Patient has no known allergies.  Family History  Problem Relation Age of Onset  . Diabetes Mother   . CVA Mother   . Heart disease Father   . Heart disease Sister   . Heart disease Brother   . Heart disease Sister   . Heart disease Sister   . Heart disease Sister   . Heart disease Brother   . Heart disease Brother   . Heart disease Brother     Social History Social History   Tobacco Use  . Smoking status: Former Smoker    Packs/day: 1.00    Years: 30.00    Pack years: 30.00    Types: Cigarettes    Last attempt to quit: 2006    Years since quitting: 13.9  . Smokeless tobacco: Never Used  . Tobacco comment: smoking cessation  materials not required  Substance Use Topics  . Alcohol use: Not Currently    Alcohol/week: 0.0 standard drinks    Frequency: Never  . Drug use: No    Review of Systems  Constitutional: No fever.  Positive for weakness. Eyes: No redness. ENT: No sore throat. Cardiovascular: Positive for chest pain. Respiratory: Positive for shortness of breath. Gastrointestinal: Positive for intermittent vomiting.  No diarrhea. Genitourinary: Negative for dysuria.  Musculoskeletal: Negative for back pain. Skin: Negative for rash. Neurological: Negative for headache.   ____________________________________________   PHYSICAL EXAM:  VITAL SIGNS: ED Triage Vitals  Enc Vitals Group     BP 03/26/18 1010 (!) 146/106     Pulse Rate 03/26/18 1010 72     Resp 03/26/18 1030 (!) 23     Temp 03/26/18 1010 97.7 F (36.5 C)     Temp Source 03/26/18 1010 Oral     SpO2 03/26/18 1010 96 %     Weight 03/26/18 1012 206 lb (93.4 kg)     Height 03/26/18 1012 5\' 6"  (1.676 m)     Head Circumference --      Peak Flow --      Pain Score 03/26/18 1011 8     Pain Loc --      Pain Edu? --      Excl. in [place]? --     Constitutional: Alert and oriented.  Relatively comfortable appearing.  No acute distress. Eyes: Conjunctivae are normal.  Head: Atraumatic. Nose: No congestion/rhinnorhea. Mouth/Throat: Mucous membranes are moist.   Neck: Normal range of motion.  Cardiovascular: Normal rate, regular rhythm. Grossly normal heart sounds.  Good peripheral circulation. Respiratory: Normal respiratory effort.  No retractions.  Faint rales to bilateral bases.. Gastrointestinal: Soft and nontender with minimal epigastric discomfort to deep palpation. No distention.  Genitourinary: No flank tenderness. Musculoskeletal: No lower extremity edema.  Extremities warm and well perfused.  Neurologic:  Normal speech and language. No gross focal neurologic deficits are appreciated.  Skin:  Skin is warm and dry. No rash  noted. Psychiatric: Anxious and somewhat tearful appearing.  Speech and behavior are normal.  ____________________________________________   LABS (all labs ordered are listed, but only abnormal results are displayed)  Labs Reviewed  BASIC METABOLIC PANEL - Abnormal; Notable for the following components:      Result Value   Glucose, Bld 110 (*)    All other components within normal limits  CBC WITH DIFFERENTIAL/PLATELET  TROPONIN I  BRAIN NATRIURETIC PEPTIDE  BLOOD GAS, VENOUS  HEPATIC FUNCTION PANEL  SEDIMENTATION RATE  TROPONIN I  URINALYSIS, COMPLETE (UACMP) WITH MICROSCOPIC  C-REACTIVE PROTEIN  ANA W/REFLEX  ANCA TITERS  COMPLEMENT, TOTAL   ____________________________________________  EKG  ED ECG REPORT I, Arta Silence, the attending physician, personally viewed and interpreted this ECG.  Date: 03/26/2018 EKG Time: 1003 Rate: 74 Rhythm: normal sinus rhythm QRS Axis: normal Intervals: normal ST/T Wave abnormalities: normal Narrative Interpretation: no evidence of acute ischemia  ____________________________________________  RADIOLOGY  CXR: No focal infiltrate or other acute abnormality  ____________________________________________   PROCEDURES  Procedure(s) performed: No  Procedures  Critical Care performed: No ____________________________________________   INITIAL IMPRESSION / ASSESSMENT AND PLAN / ED COURSE  Pertinent labs & imaging results that were available during my care of the patient were reviewed by me and considered in my medical decision making (see chart for details).  62 year old female with PMH as noted above presents with multiple complaints over the last several days to 1 week, but with an acute complaint of chest pain since this morning.  She also reports lower extremity swelling, generalized weakness, shortness of breath, and nausea.  I reviewed the past medical records in Epic; the patient was seen in the ED on 03/24/2018  for lower extremity swelling.  She had unremarkable labs and negative ultrasound.  She declined x-rays and was discharged home.  Per the patient she showed up today for labs ordered by her primary care doctor, Dr. Sanda Klein, but was not able to get them.    On exam, the patient is somewhat anxious and tearful appearing.  She expresses frustration both about not being able to get the labs when she came to the hospital, and about the fact that nobody has been able to figure out why she is having these symptoms.  The exam reveals no other focal abnormalities except for possible coarse breath sounds or mild rales to the lung bases bilaterally.  Overall the differential is very broad.  I have a low suspicion for ACS given the atypical nature of the pain and the negative EKG, but we will obtain troponins x2 to rule it out.  I will also obtain repeat basic labs to rule out anemia or other acute causes of the patient's generalized symptoms.  In addition I will send the labs which Dr. Marzetta Board had ordered including ANA and complement although these will not result today.  If the work-up is negative I anticipate discharge home with plan for close PMD follow-up.  ----------------------------------------- 3:52 PM on 03/26/2018 -----------------------------------------  The patient's lab work-up was entirely within normal limits.  Troponins were negative x2.  Her pain has resolved and she feels comfortable.  The autoimmune studies planned by her primary care physician have not yet resulted.  I will send a message to Dr. Sanda Klein about these pending studies.  I counseled the patient extensively on the results of her work-up and possible causes of her symptoms.  She states that she feels reassured, and is comfortable going home.  Return precautions given, and she expressed understanding. ____________________________________________   FINAL CLINICAL IMPRESSION(S) / ED DIAGNOSES  Final diagnoses:  Atypical chest pain       NEW MEDICATIONS STARTED DURING THIS VISIT:  Discharge Medication List as of 03/26/2018  3:41 PM       Note:  This document was prepared using Dragon voice recognition software and may include unintentional dictation errors.    Arta Silence, MD 03/26/18 [phone]

## 2018-03-26 NOTE — ED Triage Notes (Signed)
Pt ems from home for chest pain that started approx 0830 today. Pain described as a sharp pressure. Pt tool 324 mg aspirin at home. Pt with hx MI

## 2018-03-26 NOTE — Telephone Encounter (Signed)
done

## 2018-03-26 NOTE — Telephone Encounter (Unsigned)
Copied from [place] [id number]. Topic: General - Other >> Mar 26, 2018  8:29 AM Nils Flack wrote: Reason for CRM: there is an urgent referral for this pt, I need the notes to be completed in order to send the referral, thanks.

## 2018-03-26 NOTE — Discharge Instructions (Signed)
Follow-up with Dr. Sanda Klein as instructed.  Return to the ER for new, worsening, or persistent chest pain, shortness of breath, weakness, fever, or any other new or worsening symptoms that concern you.  We sent off the additional labs that Dr. Sanda Klein had planned for you to have today, and she will follow-up on those results.

## 2018-03-26 NOTE — Telephone Encounter (Signed)
Copied from [place] [id number]. Topic: General - Other >> Mar 26, 2018  8:29 AM Nils Flack wrote: Reason for CRM: there is an urgent referral for this pt, I need the notes to be completed in order to send the referral, thanks.

## 2018-03-27 LAB — COMPLEMENT, TOTAL
Compl, Total (CH50): >60 U/mL (ref 42–999999)
Compl, Total (CH50): >60 U/mL (ref 42–999999)

## 2018-03-27 LAB — ANA W/REFLEX
Anti Nuclear Antibody(ANA): NEGATIVE
Anti Nuclear Antibody(ANA): NEGATIVE

## 2018-03-29 ENCOUNTER — Telehealth: Payer: Self-pay

## 2018-03-29 ENCOUNTER — Encounter: Payer: Self-pay | Admitting: Family Medicine

## 2018-03-29 NOTE — Telephone Encounter (Signed)
Pt.notified

## 2018-03-29 NOTE — Telephone Encounter (Signed)
Copied from [place] [id number]. Topic: Quick Communication - Lab Results (Clinic Use ONLY) >> Mar 29, 2018 10:23 AM Judyann Munson wrote: Patient is calling to request a call back for lab results that were completed on 03-26-18. Please advise

## 2018-03-29 NOTE — Telephone Encounter (Signed)
I looked at every lab from 03/26/18 Nothing appears worrisome Please make sure she has a dermatology appt soon Thank you

## 2018-03-29 NOTE — Telephone Encounter (Signed)
Are you able to go over any of these from the hospital?

## 2018-03-30 LAB — ANCA TITERS
Atypical P-ANCA titer: <1:20 {titer}
Atypical P-ANCA titer: <1:20 {titer}
C-ANCA: <1:20 {titer}
C-ANCA: <1:20 {titer}
P-ANCA: <1:20 {titer}
P-ANCA: <1:20 {titer}

## 2018-03-31 ENCOUNTER — Encounter: Payer: Self-pay | Admitting: Family Medicine

## 2018-04-01 ENCOUNTER — Telehealth: Payer: Self-pay | Admitting: Gastroenterology

## 2018-04-01 ENCOUNTER — Other Ambulatory Visit: Payer: Self-pay

## 2018-04-01 DIAGNOSIS — R1084 Generalized abdominal pain: Secondary | ICD-10-CM

## 2018-04-01 NOTE — Telephone Encounter (Signed)
-----   Message from Virgel Manifold, MD sent at 04/01/2018  1:41 PM EST ----- Linda Mejia,  This pt called [facility] with abdominal pain.  She has been treated for H. pylori and still has pain.  Therefore, we can do an EGD, but will need cardiology approval.  It would be best to schedule this at least 4-6 weeks out from her recent admission for chest pain.Please also schedule her for a CT abdomen pelvis with contrast due to abdominal pain.Please tell the patient if her pain is severe and she cannot wait for outpatient appointment, she should go to the ER, otherwise we will proceed with above work-up.

## 2018-04-01 NOTE — Telephone Encounter (Signed)
Although I had basically went over this upon talking with pt., I did so again, just for reassure with pt.

## 2018-04-01 NOTE — Telephone Encounter (Signed)
I have spoken with pt at length regarding her abdominal pain. She is aware that a CT scan of abdomin/pelvis ordered for 04/09/17 at the [facility] at 9:30 am, arrival time 9:15 am. Nothing to eat or drink after midnight the night before procedure. To pick up her contrast and instructions at the [facility] either tomorrow or Monday due to the holidays.   Also, EGD has been scheduled for 05/06/2018, at [facility]. Went over prep instructions and will send via My Chart.  Pt is also aware that cardiac clearance will be needed before we can do the EGD. Will send to Dr. Neoma Laming, MD for approval.   Also, I asked what labs her daughter was talking about and she was unaware that she had called and the lab name was incorrect. Pt would like for Dr. Bonna Gains to look at some labs P-anca and Fluarescence exhib. (although these were spelled for me-unsure if correct), Was positive and this concerned her. Dr. Sanda Klein suggested she see a dermatologist for this, because of the swelling in her feet. Due to this affecting her colon (per pt research) she would like for Dr. Bonna Gains to look at labs.

## 2018-04-01 NOTE — Telephone Encounter (Signed)
Pt daughter is calling to schedule an apt due to some tests her PCP has done to go over results test GM tested Positive. Pt daughter would like a call   Prissy cb [phone]

## 2018-04-01 NOTE — Telephone Encounter (Signed)
Pt is calling again she  got  Diagnosed with   H Pal[place], she finished her rx for it.   She is not feeling well she is swollen stomach , pain, tighness, back hurts please call pt

## 2018-04-01 NOTE — Telephone Encounter (Signed)
Spoke with Dr. Bonna Gains about pt she states to do an EGD on pt, cardiac clearance is needed first. Pt agrees on EGD. She be waiting on a call from [facility]

## 2018-04-02 ENCOUNTER — Encounter: Payer: Self-pay | Admitting: Family Medicine

## 2018-04-02 NOTE — Telephone Encounter (Signed)
Pt gets upset easily, is this something you would like for me to call and tell her? If so is there any additional information to relay?

## 2018-04-03 ENCOUNTER — Other Ambulatory Visit: Payer: Self-pay | Admitting: Family Medicine

## 2018-04-09 ENCOUNTER — Ambulatory Visit
Admission: RE | Admit: 2018-04-09 | Discharge: 2018-04-09 | Disposition: A | Discharge status: Home / Self Care | Payer: Medicare Other | Source: Ambulatory Visit | Attending: Gastroenterology | Admitting: Gastroenterology

## 2018-04-09 DIAGNOSIS — K5900 Constipation, unspecified: Secondary | ICD-10-CM | POA: Diagnosis not present

## 2018-04-09 DIAGNOSIS — R1084 Generalized abdominal pain: Secondary | ICD-10-CM | POA: Insufficient documentation

## 2018-04-09 MED ORDER — IOPAMIDOL (ISOVUE-300) INJECTION 61%
100.0000 mL | Freq: Once | INTRAVENOUS | Status: AC | PRN
  Administered 2018-04-09: 100 mL via INTRAVENOUS

## 2018-04-12 ENCOUNTER — Telehealth: Payer: Self-pay | Admitting: Gastroenterology

## 2018-04-12 NOTE — Telephone Encounter (Signed)
Pt is calling for CT scan results °

## 2018-04-12 NOTE — Telephone Encounter (Signed)
Pt notified of results. EGD scheduled for Jan. 23, 2020. Cardiac clearance obtained. To continue ASA 81 mg.

## 2018-04-12 NOTE — Telephone Encounter (Signed)
-----   Message from Virgel Manifold, MD sent at 04/12/2018  1:43 PM EST ----- Jackelyn Poling please let patient know, her CT showed good results.  No inflammation, no obstruction.  No acute findings or significant abnormalities.  If she is still having pain and would like to proceed with an EGD, we would need cardiac clearance.

## 2018-04-14 DIAGNOSIS — F32A Depression, unspecified: Secondary | ICD-10-CM

## 2018-04-14 DIAGNOSIS — T7840XA Allergy, unspecified, initial encounter: Secondary | ICD-10-CM

## 2018-04-14 HISTORY — DX: Allergy, unspecified, initial encounter: T78.40XA

## 2018-04-14 HISTORY — DX: Depression, unspecified: F32.A

## 2018-04-15 ENCOUNTER — Telehealth: Payer: Self-pay | Admitting: Gastroenterology

## 2018-04-15 NOTE — Telephone Encounter (Signed)
After speaking with Dr. Bonna Gains, she states she could do her procedure (EGD) on Monday 04/19/2018 at 12:30 pm. Pt was very happy to hear this and is agreeable. Procedure originally scheduled for 05/06/2018. Pt states that her stomach swollen and burning, back hurting, and left side especially (underneath breast) painful, some nausea and felt urge to have BM but did not.  She is somewhat better today and it finally eased up so she could sleep last night. Was advised to go to ED if pain severe prior to [facility] procedure. Pt very appreciative.

## 2018-04-15 NOTE — Telephone Encounter (Signed)
Pt is calling to see if she can have her procedure done sooner then 05/06/18 she has been hurting so bad she almost went to ER her stomach is hurting please call pt

## 2018-04-16 ENCOUNTER — Telehealth: Payer: Self-pay

## 2018-04-16 ENCOUNTER — Telehealth: Payer: Self-pay | Admitting: Gastroenterology

## 2018-04-16 NOTE — Telephone Encounter (Signed)
Pt calls and states she would like for her my chart message to be disregarded that she will have someone else besides her daughter be with her at the hospital so she can go ahead and get EGD done. She does request however that no health care information be presented in front of the person who is with her.  Such as when the procedure is over and the doctor comes to talk with her. Will forward this message to Dr. Bonna Gains.

## 2018-04-16 NOTE — Telephone Encounter (Signed)
Pt left  vm for Debbie regarding her procedure on Monday

## 2018-04-16 NOTE — Telephone Encounter (Signed)
Pt asked that we disregard her My Chart message. She is going to have procedure done as scheduled on 1/6. Will find someone to come with her.

## 2018-04-19 ENCOUNTER — Encounter
Admission: RE | Disposition: A | Discharge status: Home / Self Care | Payer: Self-pay | Source: Home / Self Care | Attending: Gastroenterology

## 2018-04-19 ENCOUNTER — Telehealth: Payer: Self-pay | Admitting: Gastroenterology

## 2018-04-19 ENCOUNTER — Ambulatory Visit
Admission: RE | Admit: 2018-04-19 | Discharge: 2018-04-19 | Disposition: A | Discharge status: Home / Self Care | Payer: Medicare Other | Attending: Gastroenterology | Admitting: Gastroenterology

## 2018-04-19 SURGERY — ESOPHAGOGASTRODUODENOSCOPY (EGD) WITH PROPOFOL
Anesthesia: General

## 2018-04-19 NOTE — Telephone Encounter (Signed)
Magda Paganini from [facility] calling for Linda Mejia to inform Linda Mejia pt was scheduled for EGD today and she came but wanted to reschedule so daughter could accompany pt please call pt to r/s

## 2018-04-20 ENCOUNTER — Telehealth: Payer: Self-pay | Admitting: Gastroenterology

## 2018-04-20 NOTE — Telephone Encounter (Signed)
Pt is calling to give Jackelyn Poling the dates that her daughter is available  to get the procedures r/s  04/23/18 and 04/26/18 05/03/18 05/10/18

## 2018-04-21 ENCOUNTER — Other Ambulatory Visit: Payer: Self-pay

## 2018-04-21 DIAGNOSIS — R1084 Generalized abdominal pain: Secondary | ICD-10-CM

## 2018-04-21 NOTE — Telephone Encounter (Signed)
Returned patients call.  Rescheduled her EGD for 04/23/18 with Dr. Bonna Gains at [facility].  Thanks [employer]

## 2018-04-23 ENCOUNTER — Encounter: Payer: Self-pay | Admitting: *Deleted

## 2018-04-23 ENCOUNTER — Ambulatory Visit
Admission: RE | Admit: 2018-04-23 | Discharge: 2018-04-23 | Disposition: A | Discharge status: Home / Self Care | Payer: Medicare Other | Attending: Gastroenterology | Admitting: Gastroenterology

## 2018-04-23 ENCOUNTER — Ambulatory Visit: Payer: Medicare Other | Admitting: Anesthesiology

## 2018-04-23 ENCOUNTER — Other Ambulatory Visit: Payer: Self-pay

## 2018-04-23 ENCOUNTER — Encounter
Admission: RE | Disposition: A | Discharge status: Home / Self Care | Payer: Self-pay | Source: Home / Self Care | Attending: Gastroenterology

## 2018-04-23 DIAGNOSIS — L97511 Non-pressure chronic ulcer of other part of right foot limited to breakdown of skin: Secondary | ICD-10-CM | POA: Diagnosis not present

## 2018-04-23 DIAGNOSIS — I2510 Atherosclerotic heart disease of native coronary artery without angina pectoris: Secondary | ICD-10-CM | POA: Insufficient documentation

## 2018-04-23 DIAGNOSIS — Z8619 Personal history of other infectious and parasitic diseases: Secondary | ICD-10-CM | POA: Diagnosis not present

## 2018-04-23 DIAGNOSIS — K259 Gastric ulcer, unspecified as acute or chronic, without hemorrhage or perforation: Secondary | ICD-10-CM | POA: Diagnosis not present

## 2018-04-23 DIAGNOSIS — I10 Essential (primary) hypertension: Secondary | ICD-10-CM | POA: Insufficient documentation

## 2018-04-23 DIAGNOSIS — E785 Hyperlipidemia, unspecified: Secondary | ICD-10-CM | POA: Diagnosis not present

## 2018-04-23 DIAGNOSIS — R1013 Epigastric pain: Secondary | ICD-10-CM

## 2018-04-23 DIAGNOSIS — Z7982 Long term (current) use of aspirin: Secondary | ICD-10-CM | POA: Insufficient documentation

## 2018-04-23 DIAGNOSIS — G8929 Other chronic pain: Secondary | ICD-10-CM | POA: Diagnosis not present

## 2018-04-23 DIAGNOSIS — K210 Gastro-esophageal reflux disease with esophagitis: Secondary | ICD-10-CM | POA: Diagnosis not present

## 2018-04-23 DIAGNOSIS — K3189 Other diseases of stomach and duodenum: Secondary | ICD-10-CM

## 2018-04-23 DIAGNOSIS — B9681 Helicobacter pylori [H. pylori] as the cause of diseases classified elsewhere: Secondary | ICD-10-CM

## 2018-04-23 DIAGNOSIS — Z8249 Family history of ischemic heart disease and other diseases of the circulatory system: Secondary | ICD-10-CM | POA: Insufficient documentation

## 2018-04-23 DIAGNOSIS — I272 Pulmonary hypertension, unspecified: Secondary | ICD-10-CM | POA: Insufficient documentation

## 2018-04-23 DIAGNOSIS — I252 Old myocardial infarction: Secondary | ICD-10-CM | POA: Diagnosis not present

## 2018-04-23 DIAGNOSIS — Z823 Family history of stroke: Secondary | ICD-10-CM | POA: Diagnosis not present

## 2018-04-23 DIAGNOSIS — Z8719 Personal history of other diseases of the digestive system: Secondary | ICD-10-CM | POA: Diagnosis not present

## 2018-04-23 DIAGNOSIS — Z79899 Other long term (current) drug therapy: Secondary | ICD-10-CM | POA: Diagnosis not present

## 2018-04-23 DIAGNOSIS — K253 Acute gastric ulcer without hemorrhage or perforation: Secondary | ICD-10-CM

## 2018-04-23 DIAGNOSIS — I700 Atherosclerosis of aorta: Secondary | ICD-10-CM | POA: Diagnosis not present

## 2018-04-23 DIAGNOSIS — M549 Dorsalgia, unspecified: Secondary | ICD-10-CM | POA: Diagnosis not present

## 2018-04-23 DIAGNOSIS — Z87891 Personal history of nicotine dependence: Secondary | ICD-10-CM | POA: Diagnosis not present

## 2018-04-23 DIAGNOSIS — F419 Anxiety disorder, unspecified: Secondary | ICD-10-CM | POA: Diagnosis not present

## 2018-04-23 DIAGNOSIS — Z833 Family history of diabetes mellitus: Secondary | ICD-10-CM | POA: Insufficient documentation

## 2018-04-23 DIAGNOSIS — K2950 Unspecified chronic gastritis without bleeding: Secondary | ICD-10-CM | POA: Insufficient documentation

## 2018-04-23 DIAGNOSIS — F909 Attention-deficit hyperactivity disorder, unspecified type: Secondary | ICD-10-CM | POA: Diagnosis not present

## 2018-04-23 DIAGNOSIS — R7303 Prediabetes: Secondary | ICD-10-CM | POA: Diagnosis not present

## 2018-04-23 DIAGNOSIS — R1084 Generalized abdominal pain: Secondary | ICD-10-CM | POA: Diagnosis not present

## 2018-04-23 DIAGNOSIS — K2970 Gastritis, unspecified, without bleeding: Secondary | ICD-10-CM | POA: Diagnosis not present

## 2018-04-23 HISTORY — PX: ESOPHAGOGASTRODUODENOSCOPY (EGD) WITH PROPOFOL: SHX5813

## 2018-04-23 SURGERY — ESOPHAGOGASTRODUODENOSCOPY (EGD) WITH PROPOFOL
Anesthesia: General

## 2018-04-23 MED ORDER — PANTOPRAZOLE SODIUM 40 MG PO TBEC: 40.0000 mg | DELAYED_RELEASE_TABLET | Freq: Two times a day (BID) | ORAL | 0 refills | Status: DC

## 2018-04-23 MED ORDER — PROPOFOL 10 MG/ML IV BOLUS
INTRAVENOUS | Status: DC | PRN
  Administered 2018-04-23: 80 mg via INTRAVENOUS

## 2018-04-23 MED ORDER — LIDOCAINE HCL (CARDIAC) PF 100 MG/5ML IV SOSY
PREFILLED_SYRINGE | INTRAVENOUS | Status: DC | PRN
  Administered 2018-04-23: 25 mg via INTRAVENOUS

## 2018-04-23 MED ORDER — PROPOFOL 500 MG/50ML IV EMUL
INTRAVENOUS | Status: AC
  Filled 2018-04-23: qty 50

## 2018-04-23 MED ORDER — SODIUM CHLORIDE 0.9 % IV SOLN
INTRAVENOUS | Status: DC
  Administered 2018-04-23: 11:00:00 via INTRAVENOUS

## 2018-04-23 MED ORDER — PROPOFOL 500 MG/50ML IV EMUL
INTRAVENOUS | Status: DC | PRN
  Administered 2018-04-23: 150 ug/kg/min via INTRAVENOUS

## 2018-04-23 NOTE — Anesthesia Post-op Follow-up Note (Signed)
Anesthesia QCDR form completed.        

## 2018-04-23 NOTE — Op Note (Addendum)
[facility] Gastroenterology Patient Name: Linda Mejia Procedure Date: 04/23/2018 10:38 AM MRN: [id number] Account #: [id number] Date of Birth: Nov 20, 1955 Admit Type: Outpatient Age: 63 Room: [facility] ENDO ROOM 2 Gender: Female Note Status: Finalized Procedure:            Upper GI endoscopy Indications:          Epigastric abdominal pain, Follow-up of Helicobacter                        pylori Providers:            Emmi Wertheim B. Bonna Gains MD, MD Referring MD:         Arnetha Courser (Referring MD) Medicines:            Monitored Anesthesia Care Complications:        No immediate complications. Procedure:            Pre-Anesthesia Assessment:                       - Prior to the procedure, a History and Physical was                        performed, and patient medications, allergies and                        sensitivities were reviewed. The patient's tolerance of                        previous anesthesia was reviewed.                       - The risks and benefits of the procedure and the                        sedation options and risks were discussed with the                        patient. All questions were answered and informed                        consent was obtained.                       - Patient identification and proposed procedure were                        verified prior to the procedure by the physician, the                        nurse, the anesthesiologist, the anesthetist and the                        technician. The procedure was verified in the procedure                        room.                       - ASA Grade Assessment: II - A patient with mild  systemic disease.                       After obtaining informed consent, the endoscope was                        passed under direct vision. Throughout the procedure,                        the patient's blood pressure, pulse, and oxygen                        saturations  were monitored continuously. The Endoscope                        was introduced through the mouth, and advanced to the                        third part of duodenum. The upper GI endoscopy was                        accomplished with ease. The patient tolerated the                        procedure well. Findings:      LA Grade B (one or more mucosal breaks greater than 5 mm, not extending       between the tops of two mucosal folds) esophagitis with no bleeding was       found in the distal esophagus.      One non-bleeding gastric ulcer with a clean ulcer base (Forrest Class       III) was found in the gastric antrum. The lesion was 4 mm in largest       dimension.      Patchy mildly erythematous mucosa without bleeding was found in the       gastric antrum. Biopsies were taken with a cold forceps for histology.       Biopsies were obtained in the gastric body, at the incisura and in the       gastric antrum with cold forceps for histology.      The duodenal bulb, second portion of the duodenum, third portion of the       duodenum and examined duodenum were normal. Impression:           - LA Grade B reflux esophagitis.                       - Non-bleeding gastric ulcer with a clean ulcer base                        (Forrest Class III).                       - Erythematous mucosa in the antrum. Biopsied.                       - Normal duodenal bulb, second portion of the duodenum,                        third portion of the duodenum and examined duodenum.                       -  Biopsies were obtained in the gastric body, at the                        incisura and in the gastric antrum. Recommendation:       - Await pathology results.                       - Discharge patient to home (with escort).                       - Advance diet as tolerated.                       - Continue present medications.                       - Patient has a contact number available for                         emergencies. The signs and symptoms of potential                        delayed complications were discussed with the patient.                        Return to normal activities tomorrow. Written discharge                        instructions were provided to the patient.                       - Discharge patient to home (with escort).                       - The findings and recommendations were discussed with                        the patient.                       - The findings and recommendations were discussed with                        the patient's family.                       - Follow an antireflux regimen.                       - Take prescribed proton pump inhibitor or H2 blocker                        (antacid) medications 30 - 60 minutes before meals.                       - Avoid NSAIDs except Aspirin if medically indicated                       - Pt is due for a screening colonoscopy and this was                        discussed with her and  her daughter prior to any                        sedation given today. Pt states she had cologuard                        testing with her PCP and preferred a stool test. I did                        discuss with her that the stool test might not detect                        small polyps that can be removed at this time and if it                        is positive she would then need a diagnostic                        colonoscopy. I have asked her to follow up with her PCP                        to determine when her last cologuard test was and to                        ensure she is getting them on the recommended schedule.                        If she would like to have a screening colonoscopy                        instead in the future I have asked her to call us and                        verbalized understanding.                       - (Risks of PPI use were discussed with patient                        including bone  loss, C. Diff diarrhea, pneumonia,                        infections, CKD, electrolyte abnormalities. If                        clinically possible based on symptoms, goal would be to                        maintain patient on the lowest dose possible, or                        discontinue the medication with institution of acid                        reflux lifestyle modifications over time. Pt.                        Verbalizes understanding and chooses to  continue the                        medication.) Procedure Code(s):    --- Professional ---                       [phone], Esophagogastroduodenoscopy, flexible, transoral;                        with biopsy, single or multiple Diagnosis Code(s):    --- Professional ---                       K21.0, Gastro-esophageal reflux disease with esophagitis                       K25.9, Gastric ulcer, unspecified as acute or chronic,                        without hemorrhage or perforation                       K31.89, Other diseases of stomach and duodenum                       R10.13, Epigastric pain                       O03.55, Helicobacter pylori [H. pylori] as the cause of                        diseases classified elsewhere CPT copyright 2018 American Medical Association. All rights reserved. The codes documented in this report are preliminary and upon coder review may  be revised to meet current compliance requirements.  [name], MD Margretta Sidle B. Bonna Gains MD, MD 04/23/2018 11:00:08 AM This report has been signed electronically. Number of Addenda: 0 Note Initiated On: 04/23/2018 10:38 AM Estimated Blood Loss: Estimated blood loss: none.      [facility]

## 2018-04-23 NOTE — Anesthesia Preprocedure Evaluation (Signed)
Anesthesia Evaluation  Patient identified by MRN, date of birth, ID band Patient awake    Reviewed: Allergy & Precautions, NPO status , Patient's Chart, lab work & pertinent test results  History of Anesthesia Complications Negative for: history of anesthetic complications  Airway Mallampati: III       Dental  (+) Upper Dentures   Pulmonary neg sleep apnea, neg COPD, former smoker,           Cardiovascular hypertension, Pt. on medications + CAD, + Past MI and + Cardiac Stents  (-) CHF (-) dysrhythmias (-) Valvular Problems/Murmurs     Neuro/Psych neg Seizures Anxiety    GI/Hepatic Neg liver ROS, GERD  ,  Endo/Other  neg diabetes  Renal/GU negative Renal ROS     Musculoskeletal   Abdominal   Peds  Hematology   Anesthesia Other Findings   Reproductive/Obstetrics                             Anesthesia Physical Anesthesia Plan  ASA: III  Anesthesia Plan: General   Post-op Pain Management:    Induction: Intravenous  PONV Risk Score and Plan: 3 and Propofol infusion, TIVA and Treatment may vary due to age or medical condition  Airway Management Planned: Nasal Cannula  Additional Equipment:   Intra-op Plan:   Post-operative Plan:   Informed Consent: I have reviewed the patients History and Physical, chart, labs and discussed the procedure including the risks, benefits and alternatives for the proposed anesthesia with the patient or authorized representative who has indicated his/her understanding and acceptance.     Plan Discussed with:   Anesthesia Plan Comments:         Anesthesia Quick Evaluation

## 2018-04-23 NOTE — Anesthesia Postprocedure Evaluation (Signed)
Anesthesia Post Note  Patient: Linda Mejia  Procedure(s) Performed: ESOPHAGOGASTRODUODENOSCOPY (EGD) WITH PROPOFOL (N/A )  Patient location during evaluation: Endoscopy Anesthesia Type: General Level of consciousness: awake and alert Pain management: pain level controlled Vital Signs Assessment: post-procedure vital signs reviewed and stable Respiratory status: spontaneous breathing and respiratory function stable Cardiovascular status: stable Anesthetic complications: no     Last Vitals:  Vitals:   04/23/18 1115 04/23/18 1125  BP: 121/71 115/77  Pulse:    Resp:    Temp:    SpO2:      Last Pain:  Vitals:   04/23/18 1125  TempSrc:   PainSc: 0-No pain                 KEPHART,WILLIAM K

## 2018-04-23 NOTE — H&P (Signed)
[name], MD [address], [place], [place], [place], [address] [address], [place], [place], [place], [address] Phone: [phone]  Fax: [phone]  Primary Care Physician:  Arnetha Courser, MD   Pre-Procedure History & Physical: HPI:  Linda Mejia is a 63 y.o. female is here for an EGD.   Past Medical History:  Diagnosis Date  . Abnormal ankle brachial index (ABI) 12/02/2016  . ADHD (attention deficit hyperactivity disorder)   . Anxiety   . Aortic atherosclerosis ([place]) 12/02/2016   July 2018  . Chronic back pain   . Coronary artery disease   . Herpes genitalis in women   . History of stomach ulcers    x7 this year. Bleeding  . Hypertension   . Prediabetes 12/02/2016  . Pulmonary hypertension ([place]) 03/09/2018   Echo Oct 2019    Past Surgical History:  Procedure Laterality Date  . APPENDECTOMY    . CARDIAC CATHETERIZATION    . CESAREAN SECTION    . COLON SURGERY     interception  . ESOPHAGOGASTRODUODENOSCOPY (EGD) WITH PROPOFOL N/A 09/21/2015   Procedure: ESOPHAGOGASTRODUODENOSCOPY (EGD) WITH PROPOFOL;  Surgeon: Lucilla Lame, MD;  Location: [facility] ENDOSCOPY;  Service: Endoscopy;  Laterality: N/A;  . ESOPHAGOGASTRODUODENOSCOPY (EGD) WITH PROPOFOL N/A 07/30/2016   Procedure: ESOPHAGOGASTRODUODENOSCOPY (EGD) WITH PROPOFOL;  Surgeon: Jonathon Bellows, MD;  Location: [facility] ENDOSCOPY;  Service: Endoscopy;  Laterality: N/A;  . LEFT HEART CATH Right 01/25/2018   Procedure: Left Heart Cath and Coronary Angiography;  Surgeon: Dionisio David, MD;  Location: [place] CV LAB;  Service: Cardiovascular;  Laterality: Right;  . LEFT HEART CATH AND CORONARY ANGIOGRAPHY Right 08/12/2016   Procedure: Left Heart Cath and Coronary Angiography;  Surgeon: Dionisio David, MD;  Location: [place] CV LAB;  Service: Cardiovascular;  Laterality: Right;  . TONSILLECTOMY    . TUBAL LIGATION      Prior to Admission medications   Medication Sig Start Date End Date Taking? Authorizing  Provider  amLODipine (NORVASC) 5 MG tablet TAKE 1 TABLET(5 MG) BY MOUTH DAILY FOR BLOOD PRESSURE 04/03/18  Yes Lada, Satira Anis, MD  amphetamine-dextroamphetamine (ADDERALL) 30 MG tablet Take 30 mg by mouth 2 (two) times daily.  12/02/16  Yes Lada, Satira Anis, MD  Ascorbic Acid (VITAMIN C) 100 MG tablet Take 100 mg by mouth daily.   Yes [provider]  aspirin EC 81 MG tablet Take 81 mg by mouth daily.   Yes [provider]  cholecalciferol (VITAMIN D) 1000 units tablet Take 1,000 Units by mouth daily.   Yes [provider]  diazepam (VALIUM) 10 MG tablet Take 1 tablet (10 mg total) by mouth every 8 (eight) hours as needed for anxiety. 09/22/15  Yes Fritzi Mandes, MD  fluticasone (FLONASE) 50 MCG/ACT nasal spray Place 2 sprays into both nostrils as needed. 09/22/17  Yes Lada, Satira Anis, MD  metoprolol tartrate (LOPRESSOR) 50 MG tablet TAKE 1 TABLET BY MOUTH TWICE A DAY 03/15/18  Yes Lada, Satira Anis, MD  Omega-3 Fatty Acids (FISH OIL) 1000 MG CAPS Take 1 capsule by mouth daily.   Yes [provider]  PRALUENT 75 MG/ML SOPN Inject 75 mLs as directed every 14 (fourteen) days. 11/25/16  Yes [provider]  acyclovir (ZOVIRAX) 400 MG tablet take 1 tablet by mouth twice a day 11/13/17   Arnetha Courser, MD  bismuth subsalicylate (PEPTO BISMOL) 262 MG chewable tablet Chew 2 tablets (524 mg total) by mouth 4 (four) times daily -  before meals and at bedtime. Patient not taking: Reported on 03/26/2018 03/10/18   Arnetha Courser, MD  doxycycline (VIBRA-TABS) 100 MG tablet Take 1 tablet (100 mg total) by mouth 2 (two) times daily. Patient not taking: Reported on 03/26/2018 03/10/18   Arnetha Courser, MD  fluticasone furoate-vilanterol (BREO ELLIPTA) 200-25 MCG/INH AEPB Inhale 1 puff into the lungs daily. Patient not taking: Reported on 03/16/2018 01/26/18   Bettey Costa, MD  ipratropium-albuterol (DUONEB) 0.5-2.5 (3) MG/3ML SOLN Take 3 mLs by nebulization every 4 (four) hours  as needed. Patient not taking: Reported on 03/09/2018 01/26/18   Bettey Costa, MD  metroNIDAZOLE (FLAGYL) 500 MG tablet Take 1 tablet (500 mg total) by mouth 2 (two) times daily. No alcohol or cold medicines that contain alcohol while on this med Patient not taking: Reported on 03/26/2018 03/10/18   Arnetha Courser, MD  nitroGLYCERIN (NITROSTAT) 0.4 MG SL tablet Place 1 tablet (0.4 mg total) under the tongue every 5 (five) minutes as needed for chest pain. Patient not taking: Reported on 03/26/2018 01/26/18   Bettey Costa, MD  pantoprazole (PROTONIX) 40 MG tablet Take 1 tablet (40 mg total) by mouth 2 (two) times daily. Patient not taking: Reported on 03/26/2018 03/19/18   Arnetha Courser, MD  ranolazine (RANEXA) 500 MG 12 hr tablet Take 2 tablets (1,000 mg total) by mouth 2 (two) times daily for 15 days. 01/22/18 02/06/18  Carrie Mew, MD  triamcinolone cream (KENALOG) 0.1 % Apply 1 application topically 2 (two) times daily. As needed; too strong for face, under arms, in the groin Patient not taking: Reported on 03/25/2018 01/22/18   Arnetha Courser, MD    Allergies as of 04/21/2018  . (No Known Allergies)    Family History  Problem Relation Age of Onset  . Diabetes Mother   . CVA Mother   . Heart disease Father   . Heart disease Sister   . Heart disease Brother   . Heart disease Sister   . Heart disease Sister   . Heart disease Sister   . Heart disease Brother   . Heart disease Brother   . Heart disease Brother     Social History   Socioeconomic History  . Marital status: Divorced    Spouse name: Not on file  . Number of children: 2  . Years of education: Not on file  . Highest education level: GED or equivalent  Occupational History  . Occupation: disabled  Social Needs  . Financial resource strain: Not hard at all  . Food insecurity:    Worry: Never true    Inability: Never true  . Transportation needs:    Medical: No    Non-medical: No  Tobacco Use  .  Smoking status: Former Smoker    Packs/day: 1.00    Years: 30.00    Pack years: 30.00    Types: Cigarettes    Last attempt to quit: 2006    Years since quitting: 14.0  . Smokeless tobacco: Never Used  . Tobacco comment: smoking cessation materials not required  Substance and Sexual Activity  . Alcohol use: Not Currently    Alcohol/week: 0.0 standard drinks    Frequency: Never  . Drug use: No  . Sexual activity: Not Currently    Birth control/protection: None, Post-menopausal  Lifestyle  . Physical activity:    Days per week: 0 days    Minutes per session: 0 min  . Stress: Not at all  Relationships  . Social  connections:    Talks on phone: Patient refused    Gets together: Patient refused    Attends religious service: Patient refused    Active member of club or organization: Patient refused    Attends meetings of clubs or organizations: Patient refused    Relationship status: Divorced  . Intimate partner violence:    Fear of current or ex partner: No    Emotionally abused: No    Physically abused: No    Forced sexual activity: No  Other Topics Concern  . Not on file  Social History Narrative  . Not on file    Review of Systems: See HPI, otherwise negative ROS  Physical Exam: BP (!) 141/71   Pulse 72   Temp 98.1 F (36.7 C) (Tympanic)   Resp 18   Ht 5\' 5"  (1.651 m)   Wt 94.3 kg   SpO2 100%   BMI 34.61 kg/m  General:   Alert,  pleasant and cooperative in NAD Head:  Normocephalic and atraumatic. Neck:  Supple; no masses or thyromegaly. Lungs:  Clear throughout to auscultation, normal respiratory effort.    Heart:  +S1, +S2, Regular rate and rhythm, No edema. Abdomen:  Soft, nontender and nondistended. Normal bowel sounds, without guarding, and without rebound.   Neurologic:  Alert and  oriented x4;  grossly normal neurologically.  Impression/Plan: Linda Mejia is here for an EGD for abdominal pain and history of H pylori.  Risks, benefits, limitations, and  alternatives regarding the procedure have been reviewed with the patient.  Questions have been answered.  All parties agreeable.   Virgel Manifold, MD  04/23/2018, 10:25 AM

## 2018-04-23 NOTE — Transfer of Care (Signed)
Immediate Anesthesia Transfer of Care Note  Patient: Linda Mejia  Procedure(s) Performed: ESOPHAGOGASTRODUODENOSCOPY (EGD) WITH PROPOFOL (N/A )  Patient Location: PACU and Endoscopy Unit  Anesthesia Type:General  Level of Consciousness: awake  Airway & Oxygen Therapy: Patient Spontanous Breathing  Post-op Assessment: Report given to RN  Post vital signs: stable  Last Vitals:  Vitals Value Taken Time  BP 137/93 04/23/2018 10:57 AM  Temp 36.2 C 04/23/2018 10:55 AM  Pulse 70 04/23/2018 10:58 AM  Resp 12 04/23/2018 10:58 AM  SpO2 94 % 04/23/2018 10:58 AM  Vitals shown include unvalidated device data.  Last Pain:  Vitals:   04/23/18 1055  TempSrc: Tympanic  PainSc: 0-No pain         Complications: No apparent anesthesia complications

## 2018-04-26 DIAGNOSIS — M67839 Other specified disorders of synovium and tendon, unspecified forearm: Secondary | ICD-10-CM | POA: Diagnosis not present

## 2018-04-26 DIAGNOSIS — S66912A Strain of unspecified muscle, fascia and tendon at wrist and hand level, left hand, initial encounter: Secondary | ICD-10-CM | POA: Diagnosis not present

## 2018-04-26 LAB — SURGICAL PATHOLOGY

## 2018-04-28 ENCOUNTER — Encounter: Payer: Self-pay | Admitting: Gastroenterology

## 2018-05-10 ENCOUNTER — Encounter: Payer: Self-pay | Admitting: Family Medicine

## 2018-05-10 ENCOUNTER — Ambulatory Visit (INDEPENDENT_AMBULATORY_CARE_PROVIDER_SITE_OTHER): Payer: Medicare Other | Admitting: Family Medicine

## 2018-05-10 VITALS — BP 130/72 | HR 69 | Temp 97.9°F | Resp 16 | Ht 65.0 in | Wt 208.9 lb

## 2018-05-10 DIAGNOSIS — Z87898 Personal history of other specified conditions: Secondary | ICD-10-CM | POA: Diagnosis not present

## 2018-05-10 DIAGNOSIS — R51 Headache: Secondary | ICD-10-CM

## 2018-05-10 DIAGNOSIS — Z6834 Body mass index (BMI) 34.0-34.9, adult: Secondary | ICD-10-CM

## 2018-05-10 DIAGNOSIS — R519 Headache, unspecified: Secondary | ICD-10-CM

## 2018-05-10 DIAGNOSIS — R7303 Prediabetes: Secondary | ICD-10-CM | POA: Diagnosis not present

## 2018-05-10 DIAGNOSIS — H02403 Unspecified ptosis of bilateral eyelids: Secondary | ICD-10-CM

## 2018-05-10 DIAGNOSIS — E6609 Other obesity due to excess calories: Secondary | ICD-10-CM | POA: Diagnosis not present

## 2018-05-10 DIAGNOSIS — H547 Unspecified visual loss: Secondary | ICD-10-CM | POA: Diagnosis not present

## 2018-05-10 NOTE — Progress Notes (Signed)
BP 130/72 (BP Location: Left Arm, Patient Position: Sitting, Cuff Size: Normal)   Pulse 69   Temp 97.9 F (36.6 C) (Oral)   Resp 16   Ht 5\' 5"  (1.651 m)   Wt 208 lb 14.4 oz (94.8 kg)   SpO2 99%   BMI 34.76 kg/m    Subjective:    Patient ID: Linda Mejia Plan, female    DOB: July 09, 1955, 63 y.o.   MRN: [id number]  HPI: Linda Mejia is a 63 y.o. female  Chief Complaint  Patient presents with  . Eye Problem    difficulty seeing because of extra skin on her eyelids. She would like a referral to have it removed. Her mom and sister had the same problem.    HPI She is here for an eye problems  Reviewed her med list: Not taking Breo; no problems with the breathing and no wheezing; not using the Breo at all H pylori; all cleared up now she says; she had the EGD and they did biopsies; no infection on the biopsies; no blood in the stool; burping and acid reflux is much better Went to the gym and signed up at [employer], going to be smaller the next time she is here she says; "I can't stand being fat"; she would like to weigh 160 to 165 pounds; she does not want to see a nutritionist  Prediabetes; last sugars reviewed; she is working on weight and eating; not drinking sugary drinkers; no sugar in the house; sugar free creamer  Lab Results  Component Value Date   HGBA1C 5.8 (H) 01/24/2018   She has a problem with the skin hanging over her eyes; mother had it too; needs it taken off; oldest sister had her done too; trying to raise her eyelids and having headaches from this; when holding up the lids, problem solved  She did drink alcohol in the past; quit in June 2018; after husband passed; trying to numb herself; she   Depression screen [facility] 2/9 03/09/2018 01/22/2018 12/15/2017 12/11/2017 06/19/2017  Decreased Interest 1 3 0 0 0  Down, Depressed, Hopeless 0 3 0 0 0  PHQ - 2 Score 1 6 0 0 0  Altered sleeping 3 0 0 0 -  Tired, decreased energy 3 3 0 0 -  Change in appetite 1 0 0 0 -  Feeling bad  or failure about yourself  1 3 0 0 -  Trouble concentrating 0 0 0 0 -  Moving slowly or fidgety/restless 0 0 0 0 -  Suicidal thoughts 0 0 0 0 -  PHQ-9 Score 9 12 0 0 -  Difficult doing work/chores Somewhat difficult Very difficult Not difficult at all Not difficult at all -   Fall Risk  05/10/2018 03/09/2018 01/22/2018 12/15/2017 12/11/2017  Falls in the past year? 0 0 No No No  Number falls in past yr: 0 0 - - -  Injury with Fall? 0 - - - -  Comment - - - - -  Risk for fall due to : - - - Impaired vision;History of fall(s);Medication side effect -  Risk for fall due to: Comment - - - wears eyeglasses; back pain; fell in tub -    Relevant past medical, surgical, family and social history reviewed Past Medical History:  Diagnosis Date  . Abnormal ankle brachial index (ABI) 12/02/2016  . ADHD (attention deficit hyperactivity disorder)   . Anxiety   . Aortic atherosclerosis ([place]) 12/02/2016   July 2018  .  Chronic back pain   . Coronary artery disease   . Herpes genitalis in women   . History of stomach ulcers    x7 this year. Bleeding  . Hypertension   . Prediabetes 12/02/2016  . Pulmonary hypertension ([place]) 03/09/2018   Echo Oct 2019   Past Surgical History:  Procedure Laterality Date  . APPENDECTOMY    . CARDIAC CATHETERIZATION    . CESAREAN SECTION    . COLON SURGERY     interception  . ESOPHAGOGASTRODUODENOSCOPY (EGD) WITH PROPOFOL N/A 09/21/2015   Procedure: ESOPHAGOGASTRODUODENOSCOPY (EGD) WITH PROPOFOL;  Surgeon: Lucilla Lame, MD;  Location: [facility] ENDOSCOPY;  Service: Endoscopy;  Laterality: N/A;  . ESOPHAGOGASTRODUODENOSCOPY (EGD) WITH PROPOFOL N/A 07/30/2016   Procedure: ESOPHAGOGASTRODUODENOSCOPY (EGD) WITH PROPOFOL;  Surgeon: Jonathon Bellows, MD;  Location: [facility] ENDOSCOPY;  Service: Endoscopy;  Laterality: N/A;  . ESOPHAGOGASTRODUODENOSCOPY (EGD) WITH PROPOFOL N/A 04/23/2018   Procedure: ESOPHAGOGASTRODUODENOSCOPY (EGD) WITH PROPOFOL;  Surgeon: Virgel Manifold, MD;  Location:  [facility] ENDOSCOPY;  Service: Endoscopy;  Laterality: N/A;  . LEFT HEART CATH Right 01/25/2018   Procedure: Left Heart Cath and Coronary Angiography;  Surgeon: Dionisio David, MD;  Location: [place] [facility];  Service: Cardiovascular;  Laterality: Right;  . LEFT HEART CATH AND CORONARY ANGIOGRAPHY Right 08/12/2016   Procedure: Left Heart Cath and Coronary Angiography;  Surgeon: Dionisio David, MD;  Location: [place] [facility];  Service: Cardiovascular;  Laterality: Right;  . TONSILLECTOMY    . TUBAL LIGATION     Family History  Problem Relation Age of Onset  . Diabetes Mother   . CVA Mother   . Heart disease Father   . Heart disease Sister   . Heart disease Brother   . Heart disease Sister   . Heart disease Sister   . Heart disease Sister   . Heart disease Brother   . Heart disease Brother   . Heart disease Brother    Social History   Tobacco Use  . Smoking status: Former Smoker    Packs/day: 1.00    Years: 30.00    Pack years: 30.00    Types: Cigarettes    Last attempt to quit: 2006    Years since quitting: 14.1  . Smokeless tobacco: Never Used  . Tobacco comment: smoking cessation materials not required  Substance Use Topics  . Alcohol use: Not Currently    Alcohol/week: 0.0 standard drinks    Frequency: Never  . Drug use: No     [facility] Visit from 03/09/2018 in [facility]  AUDIT-C Score  1      Interim medical history since last visit reviewed. Allergies and medications reviewed  Review of Systems Per HPI unless specifically indicated above     Objective:    BP 130/72 (BP Location: Left Arm, Patient Position: Sitting, Cuff Size: Normal)   Pulse 69   Temp 97.9 F (36.6 C) (Oral)   Resp 16   Ht 5\' 5"  (1.651 m)   Wt 208 lb 14.4 oz (94.8 kg)   SpO2 99%   BMI 34.76 kg/m     Physical Exam Constitutional:      Appearance: She is well-developed. She is obese.  Eyes:     General: No scleral icterus.       Right eye: No discharge.         Left eye: No discharge.     Extraocular Movements:     Right eye: Normal extraocular motion.     Left  eye: Normal extraocular motion.     Conjunctiva/sclera:     Right eye: Right conjunctiva is not injected.     Left eye: Left conjunctiva is not injected.     Comments: Excess lid lag both upper eyelids; no proptosis or exophthalmos  Cardiovascular:     Rate and Rhythm: Normal rate and regular rhythm.  Pulmonary:     Effort: Pulmonary effort is normal.     Breath sounds: Normal breath sounds.  Neurological:     Mental Status: She is alert.  Psychiatric:        Mood and Affect: Mood is not anxious or depressed.        Behavior: Behavior normal.       Assessment & Plan:   Problem List Items Addressed This Visit      Other   Prediabetes    Check labs on or after July 26, 2018      Obesity    Encouragement given; she sounds motivated; joining gym; encouraged getting clearance from cardiologist before starting; she does say he wants her to exercise, "oh yeah"; start slow and build up gradually      History of alcohol use disorder    Patient has not had any alcohol for 2 years except for 1-2 glasses of wine, long time ago; reviewed this diagnosis and changed at her request; no current alcohol or drug abuse      Headache    Possibly related to straining eyelid muscles to see; sending to oculoplastic surgeon       Other Visit Diagnoses    Eyelid drooping disease, bilateral    -  Primary   refer to oculoplastic surgeon for consideration of blepharoplasty   Relevant Orders   Ambulatory referral to Ophthalmology   Vision problems       refer to oculoplastic surgeon for consideration of blepharoplasty   Relevant Orders   Ambulatory referral to Ophthalmology       Follow up plan: Return in about 11 weeks (around 07/26/2018) for follow-up visit with Dr. Sanda Klein (or just after), labs too.  An after-visit summary was printed and given to the patient at [facility].  Please  see the patient instructions which may contain other information and recommendations beyond what is mentioned above in the assessment and plan.  No orders of the defined types were placed in this encounter.   Orders Placed This Encounter  Procedures  . Ambulatory referral to Ophthalmology

## 2018-05-10 NOTE — Assessment & Plan Note (Signed)
Check labs on or after July 26, 2018

## 2018-05-10 NOTE — Assessment & Plan Note (Signed)
Encouragement given; she sounds motivated; joining gym; encouraged getting clearance from cardiologist before starting; she does say he wants her to exercise, "oh yeah"; start slow and build up gradually

## 2018-05-10 NOTE — Assessment & Plan Note (Signed)
Possibly related to straining eyelid muscles to see; sending to oculoplastic surgeon

## 2018-05-10 NOTE — Patient Instructions (Addendum)
Check out the information at familydoctor.org entitled "Nutrition for Weight Loss: What You Need to Know about Fad Diets" Try to lose between 1-2 pounds per week by taking in fewer calories and burning off more calories You can succeed by limiting portions, limiting foods dense in calories and fat, becoming more active, and drinking 8 glasses of water a day (64 ounces) Don't skip meals, especially breakfast, as skipping meals may alter your metabolism Do not use over-the-counter weight loss pills or gimmicks that claim rapid weight loss A healthy BMI (or body mass index) is between 18.5 and 24.9 You can calculate your ideal BMI at the NIH website ClubMonetize.fr   Blepharoplasty  Blepharoplasty is a type of eyelid surgery that is performed to remove loose and droopy skin from around the eyes. Loose and droopy skin around the eyes is common in older people. This is because the skin becomes less stretchy (elastic) as a person ages. During this procedure, puffy bags above and below the eyes can also be removed. Puffiness may be caused by fat deposits or loose muscles around the eyes. You may have blepharoplasty on your upper eyelids, lower eyelids, or both. This procedure is performed for two reasons:  To improve your appearance.  To improve vision, if sagging upper eyelids begin to interfere with your ability to see. Tell a health care provider about:  Any allergies you have.  All medicines you are taking, including vitamins, herbs, eye drops, creams, and over-the-counter medicines.  Any problems you or family members have had with anesthetic medicines.  Any blood disorders you have.  Any surgeries you have had.  Any medical conditions you have.  Whether you are pregnant or may be pregnant. What are the risks? Generally, this is a safe procedure. However, problems may occur, including:  Bleeding.  Infection.  Dryness of the  eyes.  Trouble closing your eyes.  Eyelids that roll outward.  Scarring.  The need for more surgery.  Changes in your vision. What happens before the procedure? Staying hydrated Follow instructions from your health care provider about hydration, which may include:  Up to 2 hours before the procedure - you may continue to drink clear liquids, such as water, clear fruit juice, black coffee, and plain tea. Eating and drinking restrictions Follow instructions from your health care provider about eating and drinking, which may include:  8 hours before the procedure - stop eating heavy meals or foods such as meat, fried foods, or fatty foods.  6 hours before the procedure - stop eating light meals or foods, such as toast or cereal.  6 hours before the procedure - stop drinking milk or drinks that contain milk.  2 hours before the procedure - stop drinking clear liquids. Medicines Ask your health care provider about:  Changing or stopping your regular medicines. This is especially important if you are taking diabetes medicines or blood thinners.  Taking medicines such as aspirin and ibuprofen. These medicines can thin your blood. Do not take these medicines unless your health care provider tells you to take them.  Taking over-the-counter medicines, vitamins, herbs, and supplements. General instructions  Do not drink alcohol.  Do not use any products that contain nicotine or tobacco, such as cigarettes and e-cigarettes. If you need help quitting, ask your health care provider.  Ask your health care provider how your surgical site will be marked or identified.  Plan to have someone take you home from the hospital or clinic.  Plan to have a  responsible adult care for you for at least 24 hours after you leave the hospital or clinic. This is important. What happens during the procedure?  To lower your risk of infection: ? Your health care team will wash or sanitize their  hands. ? Your skin will be washed with soap.  An IV will be inserted into one of your veins.  Your surgeon may mark your eyelids to indicate where the incisions should be made.  You will be given one or more of the following: ? A medicine to help you relax (sedative). ? A medicine to numb the area (local anesthetic). ? A medicine to make you fall asleep (general anesthetic).  Your surgeon will make incisions in the natural creases in the skin that is above or below your eyes. ? If you are having upper and lower blepharoplasty, incisions will be made above and below your eyes. ? If you are having lower lid blepharoplasty that does not require skin removal, an incision may be made just inside your lower eyelids (transconjunctival incision).  Any fat deposits or extra skin will be removed. Loose muscle tissue may be trimmed or tightened.  Your surgeon will close the incisions with very small stitches (sutures), a type of surgical glue, or tiny adhesive strips. Transconjunctival incisions will be closed with sutures that dissolve as your body heals (absorbable sutures).  Eye drops may be placed in your eye, and ointment may be put over your incisions. The procedure may vary among health care providers and hospitals. What happens after the procedure?  Your blood pressure, heart rate, breathing rate, and blood oxygen level will be monitored until the medicines you were given have worn off.  You may have to use eye drops or ointment after your procedure.  Do not drive for 24 hours if you were given a sedative during your procedure. Summary  Blepharoplasty is a type of eyelid surgery that is performed to remove loose and droopy skin from around the eyes.  You will go home on the same day as the procedure.  Serious complications are very rare after this procedure. This information is not intended to replace advice given to you by your health care provider. Make sure you discuss any  questions you have with your health care provider. Document Released: 03/25/2001 Document Revised: 09/23/2016 Document Reviewed: 09/23/2016 Elsevier Interactive Patient Education  2019 Elsevier Inc.  Preventing Unhealthy [employer], Adult Staying at a healthy weight is important to your overall health. When fat builds up in your body, you may become overweight or obese. Being overweight or obese increases your risk of developing certain health problems, such as heart disease, diabetes, sleeping problems, joint problems, and some types of cancer. Unhealthy weight gain is often the result of making unhealthy food choices or not getting enough exercise. You can make changes to your lifestyle to prevent obesity and stay as healthy as possible. What nutrition changes can be made?   Eat only as much as your body needs. To do this: ? Pay attention to signs that you are hungry or full. Stop eating as soon as you feel full. ? If you feel hungry, try drinking water first before eating. Drink enough water so your urine is clear or pale yellow. ? Eat smaller portions. Pay attention to portion sizes when eating out. ? Look at serving sizes on food labels. Most foods contain more than one serving per container. ? Eat the recommended number of calories for your gender and activity level.  For most active people, a daily total of 2,000 calories is appropriate. If you are trying to lose weight or are not very active, you may need to eat fewer calories. Talk with your health care provider or a diet and nutrition specialist (dietitian) about how many calories you need each day.  Choose healthy foods, such as: ? Fruits and vegetables. At each meal, try to fill at least half of your plate with fruits and vegetables. ? Whole grains, such as whole-wheat bread, brown rice, and quinoa. ? Lean meats, such as chicken or fish. ? Other healthy proteins, such as beans, eggs, or tofu. ? Healthy fats, such as nuts, seeds,  fatty fish, and olive oil. ? Low-fat or fat-free dairy products.  Check food labels, and avoid food and drinks that: ? Are high in calories. ? Have added sugar. ? Are high in sodium. ? Have saturated fats or trans fats.  Cook foods in healthier ways, such as by baking, broiling, or grilling.  Make a meal plan for the week, and shop with a grocery list to help you stay on track with your purchases. Try to avoid going to the grocery store when you are hungry.  When grocery shopping, try to shop around the outside of the store first, where the fresh foods are. Doing this helps you to avoid prepackaged foods, which can be high in sugar, salt (sodium), and fat. What lifestyle changes can be made?   Exercise for 30 or more minutes on 5 or more days each week. Exercising may include brisk walking, yard work, biking, running, swimming, and team sports like basketball and soccer. Ask your health care provider which exercises are safe for you.  Do muscle-strengthening activities, such as lifting weights or using resistance bands, on 2 or more days a week.  Do not use any products that contain nicotine or tobacco, such as cigarettes and e-cigarettes. If you need help quitting, ask your health care provider.  Limit alcohol intake to no more than 1 drink a day for nonpregnant women and 2 drinks a day for men. One drink equals 12 oz of beer, 5 oz of wine, or 1 oz of hard liquor.  Try to get 7-9 hours of sleep each night. What other changes can be made?  Keep a food and activity journal to keep track of: ? What you ate and how many calories you had. Remember to count the calories in sauces, dressings, and side dishes. ? Whether you were active, and what exercises you did. ? Your calorie, weight, and activity goals.  Check your weight regularly. Track any changes. If you notice you have gained weight, make changes to your diet or activity routine.  Avoid taking weight-loss medicines or  supplements. Talk to your health care provider before starting any new medicine or supplement.  Talk to your health care provider before trying any new diet or exercise plan. Why are these changes important? Eating healthy, staying active, and having healthy habits can help you to prevent obesity. Those changes also:  Help you manage stress and emotions.  Help you connect with friends and family.  Improve your self-esteem.  Improve your sleep.  Prevent long-term health problems. What can happen if changes are not made? Being obese or overweight can cause you to develop joint or bone problems, which can make it hard for you to stay active or do activities you enjoy. Being obese or overweight also puts stress on your heart and lungs and can lead  to health problems like diabetes, heart disease, and some cancers. Where to find more information Talk with your health care provider or a dietitian about healthy eating and healthy lifestyle choices. You may also find information from:  U.S. Department of Agriculture, MyPlate: FormerBoss.no  American Heart Association: www.heart.org  Centers for Disease Control and Prevention: http://www.wolf.info/ Summary  Staying at a healthy weight is important to your overall health. It helps you to prevent certain diseases and health problems, such as heart disease, diabetes, joint problems, sleep disorders, and some types of cancer.  Being obese or overweight can cause you to develop joint or bone problems, which can make it hard for you to stay active or do activities you enjoy.  You can prevent unhealthy weight gain by eating a healthy diet, exercising regularly, not smoking, limiting alcohol, and getting enough sleep.  Talk with your health care provider or a dietitian for guidance about healthy eating and healthy lifestyle choices. This information is not intended to replace advice given to you by your health care provider. Make sure you discuss any  questions you have with your health care provider. Document Released: 04/01/2016 Document Revised: 01/09/2017 Document Reviewed: 05/07/2016 Elsevier Interactive Patient Education  2019 [employer].

## 2018-05-10 NOTE — Assessment & Plan Note (Signed)
Patient has not had any alcohol for 2 years except for 1-2 glasses of wine, long time ago; reviewed this diagnosis and changed at her request; no current alcohol or drug abuse

## 2018-05-16 ENCOUNTER — Inpatient Hospital Stay
Admission: EM | Admit: 2018-05-16 | Discharge: 2018-05-25 | DRG: 871 | Disposition: A | Discharge status: Home / Self Care | Payer: Medicare Other | Attending: Internal Medicine | Admitting: Internal Medicine

## 2018-05-16 ENCOUNTER — Other Ambulatory Visit: Payer: Self-pay

## 2018-05-16 ENCOUNTER — Emergency Department: Payer: Medicare Other

## 2018-05-16 ENCOUNTER — Inpatient Hospital Stay: Payer: Self-pay

## 2018-05-16 DIAGNOSIS — Z7951 Long term (current) use of inhaled steroids: Secondary | ICD-10-CM

## 2018-05-16 DIAGNOSIS — Z79899 Other long term (current) drug therapy: Secondary | ICD-10-CM | POA: Diagnosis not present

## 2018-05-16 DIAGNOSIS — A419 Sepsis, unspecified organism: Secondary | ICD-10-CM | POA: Diagnosis not present

## 2018-05-16 DIAGNOSIS — I10 Essential (primary) hypertension: Secondary | ICD-10-CM | POA: Diagnosis present

## 2018-05-16 DIAGNOSIS — R7303 Prediabetes: Secondary | ICD-10-CM | POA: Diagnosis present

## 2018-05-16 DIAGNOSIS — D638 Anemia in other chronic diseases classified elsewhere: Secondary | ICD-10-CM | POA: Diagnosis present

## 2018-05-16 DIAGNOSIS — R0602 Shortness of breath: Secondary | ICD-10-CM | POA: Diagnosis not present

## 2018-05-16 DIAGNOSIS — J189 Pneumonia, unspecified organism: Secondary | ICD-10-CM

## 2018-05-16 DIAGNOSIS — J9602 Acute respiratory failure with hypercapnia: Secondary | ICD-10-CM | POA: Diagnosis not present

## 2018-05-16 DIAGNOSIS — R4182 Altered mental status, unspecified: Secondary | ICD-10-CM | POA: Diagnosis present

## 2018-05-16 DIAGNOSIS — F909 Attention-deficit hyperactivity disorder, unspecified type: Secondary | ICD-10-CM | POA: Diagnosis present

## 2018-05-16 DIAGNOSIS — Z66 Do not resuscitate: Secondary | ICD-10-CM | POA: Diagnosis present

## 2018-05-16 DIAGNOSIS — R6521 Severe sepsis with septic shock: Secondary | ICD-10-CM | POA: Diagnosis not present

## 2018-05-16 DIAGNOSIS — J9601 Acute respiratory failure with hypoxia: Secondary | ICD-10-CM | POA: Diagnosis present

## 2018-05-16 DIAGNOSIS — J154 Pneumonia due to other streptococci: Secondary | ICD-10-CM | POA: Diagnosis present

## 2018-05-16 DIAGNOSIS — Z87891 Personal history of nicotine dependence: Secondary | ICD-10-CM | POA: Diagnosis not present

## 2018-05-16 DIAGNOSIS — Z955 Presence of coronary angioplasty implant and graft: Secondary | ICD-10-CM | POA: Diagnosis not present

## 2018-05-16 DIAGNOSIS — Z7982 Long term (current) use of aspirin: Secondary | ICD-10-CM

## 2018-05-16 DIAGNOSIS — E872 Acidosis: Secondary | ICD-10-CM | POA: Diagnosis not present

## 2018-05-16 DIAGNOSIS — A403 Sepsis due to Streptococcus pneumoniae: Secondary | ICD-10-CM | POA: Diagnosis present

## 2018-05-16 DIAGNOSIS — E440 Moderate protein-calorie malnutrition: Secondary | ICD-10-CM | POA: Diagnosis present

## 2018-05-16 DIAGNOSIS — Z833 Family history of diabetes mellitus: Secondary | ICD-10-CM

## 2018-05-16 DIAGNOSIS — J9600 Acute respiratory failure, unspecified whether with hypoxia or hypercapnia: Secondary | ICD-10-CM | POA: Diagnosis not present

## 2018-05-16 DIAGNOSIS — J181 Lobar pneumonia, unspecified organism: Secondary | ICD-10-CM | POA: Diagnosis not present

## 2018-05-16 DIAGNOSIS — R0902 Hypoxemia: Secondary | ICD-10-CM | POA: Diagnosis not present

## 2018-05-16 DIAGNOSIS — I739 Peripheral vascular disease, unspecified: Secondary | ICD-10-CM | POA: Diagnosis present

## 2018-05-16 DIAGNOSIS — Z8249 Family history of ischemic heart disease and other diseases of the circulatory system: Secondary | ICD-10-CM

## 2018-05-16 DIAGNOSIS — R52 Pain, unspecified: Secondary | ICD-10-CM | POA: Diagnosis not present

## 2018-05-16 DIAGNOSIS — I2510 Atherosclerotic heart disease of native coronary artery without angina pectoris: Secondary | ICD-10-CM | POA: Diagnosis present

## 2018-05-16 DIAGNOSIS — E876 Hypokalemia: Secondary | ICD-10-CM | POA: Diagnosis present

## 2018-05-16 DIAGNOSIS — L740 Miliaria rubra: Secondary | ICD-10-CM | POA: Diagnosis present

## 2018-05-16 DIAGNOSIS — Z6836 Body mass index (BMI) 36.0-36.9, adult: Secondary | ICD-10-CM

## 2018-05-16 DIAGNOSIS — N179 Acute kidney failure, unspecified: Secondary | ICD-10-CM | POA: Diagnosis not present

## 2018-05-16 DIAGNOSIS — I272 Pulmonary hypertension, unspecified: Secondary | ICD-10-CM | POA: Diagnosis present

## 2018-05-16 DIAGNOSIS — J188 Other pneumonia, unspecified organism: Secondary | ICD-10-CM | POA: Diagnosis not present

## 2018-05-16 DIAGNOSIS — J90 Pleural effusion, not elsewhere classified: Secondary | ICD-10-CM | POA: Diagnosis not present

## 2018-05-16 LAB — URINALYSIS, ROUTINE W REFLEX MICROSCOPIC
Bilirubin Urine: NEGATIVE
Glucose, UA: NEGATIVE mg/dL
Hgb urine dipstick: NEGATIVE
Ketones, ur: NEGATIVE mg/dL
Leukocytes, UA: NEGATIVE
Nitrite: NEGATIVE
Protein, ur: NEGATIVE mg/dL
Specific Gravity, Urine: 1.01 (ref 1.005–1.030)
pH: 5 (ref 5.0–8.0)

## 2018-05-16 LAB — COMPREHENSIVE METABOLIC PANEL
ALT: 16 U/L (ref 0–44)
AST: 26 U/L (ref 15–41)
Albumin: 4 g/dL (ref 3.5–5.0)
Alkaline Phosphatase: 61 U/L (ref 38–126)
Anion gap: 10 (ref 5–15)
BUN: 15 mg/dL (ref 8–23)
CO2: 29 mmol/L (ref 22–32)
Calcium: 8.6 mg/dL — ABNORMAL LOW (ref 8.9–10.3)
Chloride: 96 mmol/L — ABNORMAL LOW (ref 98–111)
Creatinine, Ser: 0.76 mg/dL (ref 0.44–1.00)
GFR calc Af Amer: >60 mL/min (ref >60)
GFR calc non Af Amer: >60 mL/min (ref >60)
Glucose, Bld: 130 mg/dL — ABNORMAL HIGH (ref 70–99)
Potassium: 3.3 mmol/L — ABNORMAL LOW (ref 3.5–5.1)
Sodium: 135 mmol/L (ref 135–145)
Total Bilirubin: 0.8 mg/dL (ref 0.3–1.2)
Total Protein: 7.5 g/dL (ref 6.5–8.1)

## 2018-05-16 LAB — CBC WITH DIFFERENTIAL/PLATELET
Abs Immature Granulocytes: 0.02 10*3/uL (ref 0.00–0.07)
Basophils Absolute: 0 10*3/uL (ref 0.0–0.1)
Basophils Relative: 0 %
Eosinophils Absolute: 0 10*3/uL (ref 0.0–0.5)
Eosinophils Relative: 0 %
HCT: 38.5 % (ref 36.0–46.0)
Hemoglobin: 12.5 g/dL (ref 12.0–15.0)
Immature Granulocytes: 0 %
Lymphocytes Relative: 12 %
Lymphs Abs: 0.9 10*3/uL (ref 0.7–4.0)
MCH: 31 pg (ref 26.0–34.0)
MCHC: 32.5 g/dL (ref 30.0–36.0)
MCV: 95.5 fL (ref 80.0–100.0)
Monocytes Absolute: 0.2 10*3/uL (ref 0.1–1.0)
Monocytes Relative: 3 %
Neutro Abs: 6.2 10*3/uL (ref 1.7–7.7)
Neutrophils Relative %: 85 %
Platelets: 286 10*3/uL (ref 150–400)
RBC: 4.03 MIL/uL (ref 3.87–5.11)
RDW: 13.1 % (ref 11.5–15.5)
Smear Review: ADEQUATE
WBC: 7.4 10*3/uL (ref 4.0–10.5)
nRBC: 0 % (ref 0.0–0.2)

## 2018-05-16 LAB — BLOOD CULTURE ID PANEL (REFLEXED)

## 2018-05-16 LAB — BASIC METABOLIC PANEL
Anion gap: 9 (ref 5–15)
BUN: 19 mg/dL (ref 8–23)
CO2: 22 mmol/L (ref 22–32)
Calcium: 6.8 mg/dL — ABNORMAL LOW (ref 8.9–10.3)
Chloride: 105 mmol/L (ref 98–111)
Creatinine, Ser: 1.1 mg/dL — ABNORMAL HIGH (ref 0.44–1.00)
GFR calc Af Amer: >60 mL/min (ref >60)
GFR calc non Af Amer: 54 mL/min — ABNORMAL LOW (ref >60)
Glucose, Bld: 111 mg/dL — ABNORMAL HIGH (ref 70–99)
Potassium: 3 mmol/L — ABNORMAL LOW (ref 3.5–5.1)
Sodium: 136 mmol/L (ref 135–145)

## 2018-05-16 LAB — BLOOD GAS, ARTERIAL
Acid-Base Excess: 3 mmol/L — ABNORMAL HIGH (ref 0.0–2.0)
Bicarbonate: 27.1 mmol/L (ref 20.0–28.0)
FIO2: 0.36
O2 Saturation: 88.7 %
Patient temperature: 37
pCO2 arterial: 39 mmHg (ref 32.0–48.0)
pH, Arterial: 7.45 (ref 7.350–7.450)
pO2, Arterial: 53 mmHg — ABNORMAL LOW (ref 83.0–108.0)

## 2018-05-16 LAB — LACTIC ACID, PLASMA
Lactic Acid, Venous: 3.2 mmol/L (ref 0.5–1.9)
Lactic Acid, Venous: 5.3 mmol/L (ref 0.5–1.9)
Lactic Acid, Venous: 4.5 mmol/L (ref 0.5–1.9)
Lactic Acid, Venous: 3 mmol/L (ref 0.5–1.9)
Lactic Acid, Venous: 3.1 mmol/L (ref 0.5–1.9)

## 2018-05-16 LAB — INFLUENZA PANEL BY PCR (TYPE A & B)
Influenza A By PCR: NEGATIVE
Influenza B By PCR: NEGATIVE

## 2018-05-16 LAB — APTT: aPTT: <24 seconds — ABNORMAL LOW (ref 24–36)

## 2018-05-16 LAB — GLUCOSE, CAPILLARY: Glucose-Capillary: 113 mg/dL — ABNORMAL HIGH (ref 70–99)

## 2018-05-16 LAB — MRSA PCR SCREENING: MRSA by PCR: NEGATIVE

## 2018-05-16 LAB — MAGNESIUM
Magnesium: 1.3 mg/dL — ABNORMAL LOW (ref 1.7–2.4)
Magnesium: 1.2 mg/dL — ABNORMAL LOW (ref 1.7–2.4)

## 2018-05-16 LAB — PROCALCITONIN: Procalcitonin: 10.22 ng/mL

## 2018-05-16 LAB — PHOSPHORUS: Phosphorus: 2.5 mg/dL (ref 2.5–4.6)

## 2018-05-16 LAB — PROTIME-INR
INR: 1.03
Prothrombin Time: 13.4 seconds (ref 11.4–15.2)

## 2018-05-16 MED ORDER — POTASSIUM CHLORIDE CRYS ER 20 MEQ PO TBCR
40.0000 meq | EXTENDED_RELEASE_TABLET | Freq: Once | ORAL | Status: AC
  Administered 2018-05-16: 40 meq via ORAL
  Filled 2018-05-16: qty 2

## 2018-05-16 MED ORDER — PHENYLEPHRINE HCL-NACL 10-0.9 MG/250ML-% IV SOLN
0.0000 ug/min | INTRAVENOUS | Status: DC
  Filled 2018-05-16: qty 250

## 2018-05-16 MED ORDER — SODIUM CHLORIDE 0.9 % IV SOLN
500.0000 mg | INTRAVENOUS | Status: DC
  Administered 2018-05-16: 500 mg via INTRAVENOUS
  Filled 2018-05-16 (×2): qty 500

## 2018-05-16 MED ORDER — SODIUM CHLORIDE 0.9 % IV BOLUS
1000.0000 mL | Freq: Once | INTRAVENOUS | Status: AC
  Administered 2018-05-16: 1000 mL via INTRAVENOUS

## 2018-05-16 MED ORDER — SODIUM CHLORIDE 0.9 % IV SOLN
0.0000 ug/min | INTRAVENOUS | Status: DC
  Administered 2018-05-16: 20 ug/min via INTRAVENOUS
  Administered 2018-05-16: 110 ug/min via INTRAVENOUS
  Administered 2018-05-16: 130 ug/min via INTRAVENOUS
  Filled 2018-05-16 (×3): qty 10

## 2018-05-16 MED ORDER — METOPROLOL TARTRATE 50 MG PO TABS
50.0000 mg | ORAL_TABLET | Freq: Two times a day (BID) | ORAL | Status: DC
  Administered 2018-05-18 – 2018-05-19 (×2): 50 mg via ORAL
  Filled 2018-05-16 (×2): qty 1

## 2018-05-16 MED ORDER — DIAZEPAM 2 MG PO TABS
10.0000 mg | ORAL_TABLET | Freq: Three times a day (TID) | ORAL | Status: DC | PRN
  Administered 2018-05-16 – 2018-05-17 (×2): 10 mg via ORAL
  Filled 2018-05-16 (×2): qty 5

## 2018-05-16 MED ORDER — SODIUM CHLORIDE 0.9 % IV SOLN
2.0000 g | Freq: Once | INTRAVENOUS | Status: AC
  Administered 2018-05-16: 2 g via INTRAVENOUS
  Filled 2018-05-16: qty 2

## 2018-05-16 MED ORDER — SODIUM CHLORIDE 0.9 % IV SOLN
2.0000 g | INTRAVENOUS | Status: DC
  Administered 2018-05-16: 2 g via INTRAVENOUS
  Filled 2018-05-16: qty 20
  Filled 2018-05-16: qty 2

## 2018-05-16 MED ORDER — HYDROCODONE-ACETAMINOPHEN 5-325 MG PO TABS
1.0000 | ORAL_TABLET | ORAL | Status: DC | PRN
  Administered 2018-05-16 – 2018-05-18 (×8): 1 via ORAL
  Administered 2018-05-19 (×4): 2 via ORAL
  Administered 2018-05-20 – 2018-05-21 (×5): 1 via ORAL
  Administered 2018-05-22: 23:00:00 2 via ORAL
  Administered 2018-05-22 (×3): 1 via ORAL
  Administered 2018-05-23 – 2018-05-24 (×4): 2 via ORAL
  Filled 2018-05-16: qty 1
  Filled 2018-05-16: qty 2
  Filled 2018-05-16 (×3): qty 1
  Filled 2018-05-16 (×3): qty 2
  Filled 2018-05-16 (×4): qty 1
  Filled 2018-05-16 (×2): qty 2
  Filled 2018-05-16 (×2): qty 1
  Filled 2018-05-16 (×2): qty 2
  Filled 2018-05-16 (×5): qty 1
  Filled 2018-05-16: qty 2
  Filled 2018-05-16: qty 1

## 2018-05-16 MED ORDER — PANTOPRAZOLE SODIUM 40 MG PO TBEC
40.0000 mg | DELAYED_RELEASE_TABLET | Freq: Two times a day (BID) | ORAL | Status: DC
  Administered 2018-05-16 – 2018-05-25 (×18): 40 mg via ORAL
  Filled 2018-05-16 (×18): qty 1

## 2018-05-16 MED ORDER — ENOXAPARIN SODIUM 40 MG/0.4ML ~~LOC~~ SOLN
40.0000 mg | SUBCUTANEOUS | Status: DC
  Administered 2018-05-16 – 2018-05-24 (×9): 40 mg via SUBCUTANEOUS
  Filled 2018-05-16 (×9): qty 0.4

## 2018-05-16 MED ORDER — BISACODYL 5 MG PO TBEC: 5.0000 mg | DELAYED_RELEASE_TABLET | Freq: Every day | ORAL | Status: DC | PRN

## 2018-05-16 MED ORDER — ACYCLOVIR 200 MG PO CAPS
400.0000 mg | ORAL_CAPSULE | Freq: Two times a day (BID) | ORAL | Status: DC
  Administered 2018-05-17 – 2018-05-25 (×17): 400 mg via ORAL
  Filled 2018-05-16 (×20): qty 2

## 2018-05-16 MED ORDER — MAGNESIUM SULFATE 4 GM/100ML IV SOLN
4.0000 g | Freq: Once | INTRAVENOUS | Status: AC
  Administered 2018-05-16: 4 g via INTRAVENOUS
  Filled 2018-05-16: qty 100

## 2018-05-16 MED ORDER — NITROGLYCERIN 0.4 MG SL SUBL: 0.4000 mg | SUBLINGUAL_TABLET | SUBLINGUAL | Status: DC | PRN

## 2018-05-16 MED ORDER — ONDANSETRON HCL 4 MG PO TABS: 4.0000 mg | ORAL_TABLET | Freq: Four times a day (QID) | ORAL | Status: DC | PRN

## 2018-05-16 MED ORDER — ONDANSETRON HCL 4 MG/2ML IJ SOLN: 4.0000 mg | Freq: Four times a day (QID) | INTRAMUSCULAR | Status: DC | PRN

## 2018-05-16 MED ORDER — NOREPINEPHRINE 16 MG/250ML-% IV SOLN
0.0000 ug/min | INTRAVENOUS | Status: DC
  Administered 2018-05-16: 6 ug/min via INTRAVENOUS
  Administered 2018-05-17: 10 ug/min via INTRAVENOUS
  Filled 2018-05-16 (×2): qty 250

## 2018-05-16 MED ORDER — IPRATROPIUM-ALBUTEROL 0.5-2.5 (3) MG/3ML IN SOLN
3.0000 mL | Freq: Three times a day (TID) | RESPIRATORY_TRACT | Status: DC
  Administered 2018-05-16 – 2018-05-25 (×26): 3 mL via RESPIRATORY_TRACT
  Filled 2018-05-16 (×26): qty 3

## 2018-05-16 MED ORDER — ALBUTEROL SULFATE (2.5 MG/3ML) 0.083% IN NEBU
2.5000 mg | INHALATION_SOLUTION | Freq: Four times a day (QID) | RESPIRATORY_TRACT | Status: DC
  Administered 2018-05-16: 2.5 mg via RESPIRATORY_TRACT
  Filled 2018-05-16: qty 3

## 2018-05-16 MED ORDER — SODIUM CHLORIDE 0.9 % IV SOLN
INTRAVENOUS | Status: DC
  Administered 2018-05-16 – 2018-05-17 (×2): via INTRAVENOUS

## 2018-05-16 MED ORDER — AMPHETAMINE-DEXTROAMPHETAMINE 10 MG PO TABS
30.0000 mg | ORAL_TABLET | Freq: Two times a day (BID) | ORAL | Status: DC
  Administered 2018-05-17 – 2018-05-19 (×3): 30 mg via ORAL
  Filled 2018-05-16 (×5): qty 6

## 2018-05-16 MED ORDER — GUAIFENESIN-DM 100-10 MG/5ML PO SYRP
5.0000 mL | ORAL_SOLUTION | ORAL | Status: DC | PRN
  Administered 2018-05-24: 21:00:00 5 mL via ORAL
  Filled 2018-05-16 (×2): qty 5

## 2018-05-16 MED ORDER — VANCOMYCIN HCL IN DEXTROSE 1-5 GM/200ML-% IV SOLN
1000.0000 mg | Freq: Once | INTRAVENOUS | Status: AC
  Administered 2018-05-16: 1000 mg via INTRAVENOUS
  Filled 2018-05-16: qty 200

## 2018-05-16 MED ORDER — SENNOSIDES-DOCUSATE SODIUM 8.6-50 MG PO TABS: 1.0000 | ORAL_TABLET | Freq: Every evening | ORAL | Status: DC | PRN

## 2018-05-16 MED ORDER — ALBUTEROL SULFATE (2.5 MG/3ML) 0.083% IN NEBU: 2.5000 mg | INHALATION_SOLUTION | RESPIRATORY_TRACT | Status: DC | PRN

## 2018-05-16 MED ORDER — ASPIRIN EC 81 MG PO TBEC
81.0000 mg | DELAYED_RELEASE_TABLET | Freq: Every day | ORAL | Status: DC
  Administered 2018-05-17 – 2018-05-25 (×9): 81 mg via ORAL
  Filled 2018-05-16 (×9): qty 1

## 2018-05-16 MED ORDER — POTASSIUM CHLORIDE 10 MEQ/50ML IV SOLN
10.0000 meq | INTRAVENOUS | Status: AC
  Administered 2018-05-16 – 2018-05-17 (×4): 10 meq via INTRAVENOUS
  Filled 2018-05-16 (×4): qty 50

## 2018-05-16 MED ORDER — ACETAMINOPHEN 325 MG PO TABS: 650.0000 mg | ORAL_TABLET | Freq: Four times a day (QID) | ORAL | Status: DC | PRN

## 2018-05-16 MED ORDER — ALBUTEROL SULFATE (2.5 MG/3ML) 0.083% IN NEBU
2.5000 mg | INHALATION_SOLUTION | RESPIRATORY_TRACT | Status: DC | PRN
  Administered 2018-05-18: 2.5 mg via RESPIRATORY_TRACT
  Filled 2018-05-16: qty 3

## 2018-05-16 MED ORDER — ACETAMINOPHEN 650 MG RE SUPP: 650.0000 mg | Freq: Four times a day (QID) | RECTAL | Status: DC | PRN

## 2018-05-16 NOTE — Progress Notes (Signed)
Lavonda Jumbo, RN concerning PICC placement. Patient still hypotensive, with neo-synephrine infusing, and two functioning PIVs. Unable to attempt PICC placement today. PICC nurse can attempt in am. Judson Roch, RN informed of this and stated that physician wanted PICC placed prior to starting of vasoactive medications. Sarah, RN is to contact primary concerning above. Will follow up later this shift.

## 2018-05-16 NOTE — Consult Note (Addendum)
Reason for Consult: Respiratory distress Referring Physician: Dr. Roe Rutherford Linda Mejia is an 63 y.o. female.  HPI: Linda Mejia is a 63 year old female with a past medical history remarkable for hypertension, gastric ulcer disease, coronary artery disease, anxiety/ADHD, peripheral vascular disease, pulmonary hypertension, presented to the emergency department complaining of shortness of breath cough and sputum production beginning yesterday.  She was also complaining of body aches, fever, chills, nausea and vomiting.  Chest x-ray performed revealed multiple airspace disease on the right consistent with multi lobar pneumonia.  Patient manifested signs of sepsis to include tachycardia, tachypnea, magnesium 1.3, lactic acid of 5.3, potassium of 3.3, BUN 15, creatinine 0.76, negative influenza a and B.  Was admitted to the floor.  Rapid response was called secondary to worsening shortness of breath and subsequently moved over to the intensive care unit.  Presently she is responsive and communicating complaining of dyspnea.  Past Medical History:  Diagnosis Date  . Abnormal ankle brachial index (ABI) 12/02/2016  . ADHD (attention deficit hyperactivity disorder)   . Anxiety   . Aortic atherosclerosis ([place]) 12/02/2016   July 2018  . Chronic back pain   . Coronary artery disease   . Herpes genitalis in women   . History of stomach ulcers    x7 this year. Bleeding  . Hypertension   . Prediabetes 12/02/2016  . Pulmonary hypertension ([place]) 03/09/2018   Echo Oct 2019    Past Surgical History:  Procedure Laterality Date  . APPENDECTOMY    . CARDIAC CATHETERIZATION    . CESAREAN SECTION    . COLON SURGERY     interception  . ESOPHAGOGASTRODUODENOSCOPY (EGD) WITH PROPOFOL N/A 09/21/2015   Procedure: ESOPHAGOGASTRODUODENOSCOPY (EGD) WITH PROPOFOL;  Surgeon: Lucilla Lame, MD;  Location: [facility] ENDOSCOPY;  Service: Endoscopy;  Laterality: N/A;  . ESOPHAGOGASTRODUODENOSCOPY (EGD) WITH PROPOFOL N/A 07/30/2016    Procedure: ESOPHAGOGASTRODUODENOSCOPY (EGD) WITH PROPOFOL;  Surgeon: Jonathon Bellows, MD;  Location: [facility] ENDOSCOPY;  Service: Endoscopy;  Laterality: N/A;  . ESOPHAGOGASTRODUODENOSCOPY (EGD) WITH PROPOFOL N/A 04/23/2018   Procedure: ESOPHAGOGASTRODUODENOSCOPY (EGD) WITH PROPOFOL;  Surgeon: Virgel Manifold, MD;  Location: [facility] ENDOSCOPY;  Service: Endoscopy;  Laterality: N/A;  . LEFT HEART CATH Right 01/25/2018   Procedure: Left Heart Cath and Coronary Angiography;  Surgeon: Dionisio David, MD;  Location: [place] [facility];  Service: Cardiovascular;  Laterality: Right;  . LEFT HEART CATH AND CORONARY ANGIOGRAPHY Right 08/12/2016   Procedure: Left Heart Cath and Coronary Angiography;  Surgeon: Dionisio David, MD;  Location: [place] [facility];  Service: Cardiovascular;  Laterality: Right;  . TONSILLECTOMY    . TUBAL LIGATION      Family History  Problem Relation Age of Onset  . Diabetes Mother   . CVA Mother   . Heart disease Father   . Heart disease Sister   . Heart disease Brother   . Heart disease Sister   . Heart disease Sister   . Heart disease Sister   . Heart disease Brother   . Heart disease Brother   . Heart disease Brother     Social History:  reports that she quit smoking about 14 years ago. Her smoking use included cigarettes. She has a 30.00 pack-year smoking history. She has never used smokeless tobacco. She reports previous alcohol use. She reports that she does not use drugs.  Allergies: No Known Allergies  Medications: I have reviewed the patient's current medications.  Results for orders placed or performed during the hospital encounter of  05/16/18 (from the past 48 hour(s))  Lactic acid, plasma     Status: Abnormal   Collection Time: 05/16/18  8:52 AM  Result Value Ref Range   Lactic Acid, Venous 3.2 (HH) 0.5 - 1.9 mmol/L    Comment: CRITICAL RESULT CALLED TO, READ BACK BY AND VERIFIED WITH MEGAN JONES ON 05/16/2018 AT 6644 QSD Performed at [facility], [address], [place], [place] [address]   Influenza panel by PCR (type A & B)     Status: None   Collection Time: 05/16/18  8:52 AM  Result Value Ref Range   Influenza A By PCR NEGATIVE NEGATIVE   Influenza B By PCR NEGATIVE NEGATIVE    Comment: (NOTE) The Xpert Xpress Flu assay is intended as an aid in the diagnosis of  influenza and should not be used as a sole basis for treatment.  This  assay is FDA approved for nasopharyngeal swab specimens only. Nasal  washings and aspirates are unacceptable for Xpert Xpress Flu testing. Performed at [facility], [address] [place], [place] [address]   Comprehensive metabolic panel     Status: Abnormal   Collection Time: 05/16/18  8:53 AM  Result Value Ref Range   Sodium 135 135 - 145 mmol/L   Potassium 3.3 (L) 3.5 - 5.1 mmol/L   Chloride 96 (L) 98 - 111 mmol/L   CO2 29 22 - 32 mmol/L   Glucose, Bld 130 (H) 70 - 99 mg/dL   BUN 15 8 - 23 mg/dL   Creatinine, Ser 0.76 0.44 - 1.00 mg/dL   Calcium 8.6 (L) 8.9 - 10.3 mg/dL   Total Protein 7.5 6.5 - 8.1 g/dL   Albumin 4.0 3.5 - 5.0 g/dL   AST 26 15 - 41 U/L   ALT 16 0 - 44 U/L   Alkaline Phosphatase 61 38 - 126 U/L   Total Bilirubin 0.8 0.3 - 1.2 mg/dL   GFR calc non Af Amer >60 >60 mL/min   GFR calc Af Amer >60 >60 mL/min   Anion gap 10 5 - 15    Comment: Performed at [facility], [address] [place], [place] [address]  CBC WITH DIFFERENTIAL     Status: None   Collection Time: 05/16/18  8:53 AM  Result Value Ref Range   WBC 7.4 4.0 - 10.5 K/uL   RBC 4.03 3.87 - 5.11 MIL/uL   Hemoglobin 12.5 12.0 - 15.0 g/dL   HCT 38.5 36.0 - 46.0 %   MCV 95.5 80.0 - 100.0 fL   MCH 31.0 26.0 - 34.0 pg   MCHC 32.5 30.0 - 36.0 g/dL   RDW 13.1 11.5 - 15.5 %   Platelets 286 150 - 400 K/uL   nRBC 0.0 0.0 - 0.2 %   Neutrophils Relative % 85 %   Neutro Abs 6.2 1.7 - 7.7 K/uL   Lymphocytes Relative 12 %   Lymphs Abs 0.9 0.7 - 4.0 K/uL   Monocytes Relative 3 %    Monocytes Absolute 0.2 0.1 - 1.0 K/uL   Eosinophils Relative 0 %   Eosinophils Absolute 0.0 0.0 - 0.5 K/uL   Basophils Relative 0 %   Basophils Absolute 0.0 0.0 - 0.1 K/uL   WBC Morphology MORPHOLOGY UNREMARKABLE    Smear Review PLATELETS APPEAR ADEQUATE    Immature Granulocytes 0 %   Abs Immature Granulocytes 0.02 0.00 - 0.07 K/uL   Stomatocytes PRESENT     Comment: Performed at [facility],  [place], [place] [address]  Lactic acid, plasma     Status: Abnormal   Collection Time: 05/16/18 11:27 AM  Result Value Ref Range   Lactic Acid, Venous 5.3 (HH) 0.5 - 1.9 mmol/L    Comment: CRITICAL RESULT CALLED TO, READ BACK BY AND VERIFIED WITH MEGAN OAKLEY ON 05/16/2018 AT 1154 QSD Performed at [facility], [address], [place], [place] [address]   Magnesium     Status: Abnormal   Collection Time: 05/16/18 11:39 AM  Result Value Ref Range   Magnesium 1.3 (L) 1.7 - 2.4 mg/dL    Comment: Performed at [facility], [address] [place], [place] [address]  Protime-INR     Status: None   Collection Time: 05/16/18 11:39 AM  Result Value Ref Range   Prothrombin Time 13.4 11.4 - 15.2 seconds   INR 1.03     Comment: Performed at [facility], [address] [place], [place] [address]  Glucose, capillary     Status: Abnormal   Collection Time: 05/16/18 12:24 PM  Result Value Ref Range   Glucose-Capillary 113 (H) 70 - 99 mg/dL   Comment 1 Notify RN    Comment 2 Document in Chart     Dg Chest Port 1 View  Result Date: 05/16/2018 CLINICAL DATA:  Shortness of breath EXAM: PORTABLE CHEST 1 VIEW COMPARISON:  03/26/2018 FINDINGS: Focal/patchy right upper lobe opacity, new. Patchy right lower lobe opacity, new. These findings are suspicious for multifocal pneumonia. Left lung is clear. No pleural effusion or pneumothorax. The heart is normal in size. IMPRESSION: Patchy right upper and lower lobe opacities, suspicious for multifocal  pneumonia. Electronically Signed   By: Julian Hy M.D.   On: 05/16/2018 09:05    ROS  Called to obtain at this time as patient is in respiratory distress please see HPI Blood pressure (!) 90/58, pulse (!) 126, temperature 99 F (37.2 C), temperature source Oral, resp. rate (!) 31, height 5\' 5"  (1.651 m), weight 98.4 kg, SpO2 91 %. Physical Exam Vital signs: Please see the above listed vital signs HEENT: Trachea midline, increased work of breathing, no oral lesions noted, no jugular venous distention is appreciated Cardiovascular: Tachycardia noted Pulmonary: Rhonchi appreciated and rales on the right Abdominal: Positive bowel sounds, soft nontender exam Extremities: No clubbing, no edema, mottled lower extremities Neurologic: Cranial nerves are grossly intact, patient moves all extremities, awake alert communicating but somewhat confused Lymph node survey: No obvious adenopathy appreciated  Assessment/Plan: Pneumonia.  Patient with new infiltrate on the right compared to December x-ray.  Multi lobar.  Presently admitted, influenza negative, recent symptomatology over 24 to 48 hours.  On azithromycin, Rocephin, is also on acyclovir for prior history of herpes.  Moved to the intensive care unit, may require BiPAP.  Will monitor respiratory status closely.  When patient status improves will want better imaging studies to include CT scan of chest to further delineate infiltrates  Sepsis.  Patient's blood pressure marginal, has peripheral mottling with elevated lactic acid consistent with septic picture.  Fluid resuscitation, broad-spectrum antibiotic coverage  Hypomagnesemia.  Replace  Hypokalemia.  Replace  Prior history of coronary artery disease.  Recent cardiac catheterization in 01/2018.  Distal LAD stent with moderate disease, occluded proximal RCA with collateralization, normal ejection fraction.  Mild aortic and mitral insufficiency noted  Peripheral vascular  disease  Pulmonary hypertension.  Her echocardiogram shows moderate pulmonary hypertension  "Care time 35 minutes  Aminta Sakurai 05/16/2018, 12:30 PM

## 2018-05-16 NOTE — Progress Notes (Signed)
Spoke with Dr. Jefferson Fuel regarding PICC line placement order and [place] line team being unable to place line until tomorrow morning. Dr. Jefferson Fuel ordered RN to contact [facility] to see if they could place PICC line tonight as he does not want patient to wait until tomorrow morning. Contacted them and current ETA between 19:00 and 19:30 for arrival to place PICC line for patient.

## 2018-05-16 NOTE — Progress Notes (Signed)
Pt transferred to CCU in bed with bedside report given to Judson Roch, RN. Also reported pt has been unable to void; feeling need and she will FU on this; need UA with urine culture.

## 2018-05-16 NOTE — Progress Notes (Signed)
Pt admitted from ED with Pneumonia; altered mental status. Dgt with pt. Pt mostly alert; confused but can answer some questions. Rambling thoughts/ideas. Color gray; skin warm and dry with legs mottled. Dysneica/tachyneic at intervals with tachycardia.  IVF's and azithromycin IV started. K+ po given. Pt reports need to void; unsuccessful on bedpan; unsuccessful on BSC with 2+. Poor vein access; lactic acid rising with critical value. Dr. Bridgett Larsson notified; Jinny Blossom charge nurse aware/and in to support pt. Rapid response called.

## 2018-05-16 NOTE — ED Provider Notes (Addendum)
[facility] Emergency Department Provider Note  ____________________________________________   First MD Initiated Contact with Patient 05/16/18 0830     (approximate)  I have reviewed the triage vital signs and the nursing notes.   HISTORY  Chief Complaint flu symptoms and Altered Mental Status   HPI Linda Mejia is a 63 y.o. female with a history of CAD, prediabetes and hypertension was presented emergency department today with altered mental status as well as pain to her suprapubic region.  EMS reports the patient also has a history of UTI.  Found to be hypoxic upon arrival to the emergency department.  Difficult history as the patient is rambling and tangential.   Past Medical History:  Diagnosis Date  . Abnormal ankle brachial index (ABI) 12/02/2016  . ADHD (attention deficit hyperactivity disorder)   . Anxiety   . Aortic atherosclerosis ([place]) 12/02/2016   July 2018  . Chronic back pain   . Coronary artery disease   . Herpes genitalis in women   . History of stomach ulcers    x7 this year. Bleeding  . Hypertension   . Prediabetes 12/02/2016  . Pulmonary hypertension ([place]) 03/09/2018   Echo Oct 2019    Patient Active Problem List   Diagnosis Date Noted  . Acute peptic ulcer of stomach   . Stomach irritation   . Abdominal pain, epigastric   . Acute gastric ulcer due to Helicobacter pylori   . Pulmonary hypertension ([place]) 03/09/2018  . Unstable angina ([place]) 01/23/2018  . Medication monitoring encounter 03/18/2017  . Colon cancer screening 03/18/2017  . Coronary artery calcification seen on CT scan 12/02/2016  . Aortic atherosclerosis ([place]) 12/02/2016  . Abnormal ankle brachial index (ABI) 12/02/2016  . Prediabetes 12/02/2016  . Fracture of rib 08/28/2016  . Chest pain 08/11/2016  . Hx of tobacco use, presenting hazards to health 06/09/2016  . Degenerative arthritis of knee, bilateral 12/27/2015  . Obesity 12/27/2015  . GI bleed 09/21/2015   . Reflux esophagitis   . Other diseases of stomach and duodenum   . Multiple joint pain 06/05/2015  . Night sweats 06/05/2015  . Headache 04/12/2015  . Subclinical hypothyroidism 03/26/2015  . Herpes simplex type 2 infection 03/21/2015  . Rhinitis, allergic 03/21/2015  . Cervical pain 09/21/2014  . ADHD (attention deficit hyperactivity disorder), inattentive type 09/20/2014  . Episodic paroxysmal anxiety disorder 09/20/2014  . History of gastric ulcer 09/20/2014  . Abuse, drug or alcohol ([place]) 09/20/2014  . History of alcohol use disorder 09/20/2014  . HLD (hyperlipidemia) 10/03/2009  . Coronary artery disease involving native coronary artery of native heart with angina pectoris ([place]) 10/02/2009  . Hypertension goal BP (blood pressure) < 140/90 10/02/2009    Past Surgical History:  Procedure Laterality Date  . APPENDECTOMY    . CARDIAC CATHETERIZATION    . CESAREAN SECTION    . COLON SURGERY     interception  . ESOPHAGOGASTRODUODENOSCOPY (EGD) WITH PROPOFOL N/A 09/21/2015   Procedure: ESOPHAGOGASTRODUODENOSCOPY (EGD) WITH PROPOFOL;  Surgeon: Lucilla Lame, MD;  Location: [facility] END[facility]COPY;  Service: Endoscopy;  Laterality: N/A;  . ESOPHAGOGASTRODUODENOSCOPY (EGD) WITH PROPOFOL N/A 07/30/2016   Procedure: ESOPHAGOGASTRODUODENOSCOPY (EGD) WITH PROPOFOL;  Surgeon: Jonathon Bellows, MD;  Location: [facility];  Service: Endoscopy;  Laterality: N/A;  . ESOPHAGOGASTRODUODENOSCOPY (EGD) WITH PROPOFOL N/A 04/23/2018   Procedure: ESOPHAGOGASTRODUODENOSCOPY (EGD) WITH PROPOFOL;  Surgeon: Virgel Manifold, MD;  Location: [facility];  Service: Endoscopy;  Laterality: N/A;  . LEFT HEART CATH Right 01/25/2018  Procedure: Left Heart Cath and Coronary Angiography;  Surgeon: Dionisio Aya Geisel, MD;  Location: [facility];  Service: Cardiovascular;  Laterality: Right;  . LEFT HEART CATH AND CORONARY ANGIOGRAPHY Right 08/12/2016   Procedure: Left Heart Cath and Coronary Angiography;  Surgeon:  Dionisio Chadric Kimberley, MD;  Location: [facility];  Service: Cardiovascular;  Laterality: Right;  . TONSILLECTOMY    . TUBAL LIGATION      Prior to Admission medications   Medication Sig Start Date End Date Taking? Authorizing Provider  acyclovir (ZOVIRAX) 400 MG tablet take 1 tablet by mouth twice a day 11/13/17   Arnetha Courser, MD  amLODipine (NORVASC) 5 MG tablet TAKE 1 TABLET(5 MG) BY MOUTH DAILY FOR BLOOD PRESSURE 04/03/18   Lada, Satira Anis, MD  amphetamine-dextroamphetamine (ADDERALL) 30 MG tablet Take 30 mg by mouth 2 (two) times daily.  12/02/16   Arnetha Courser, MD  Ascorbic Acid (VITAMIN C) 100 MG tablet Take 100 mg by mouth daily.    [provider]  aspirin EC 81 MG tablet Take 81 mg by mouth daily.    [provider]  cholecalciferol (VITAMIN D) 1000 units tablet Take 1,000 Units by mouth daily.    [provider]  diazepam (VALIUM) 10 MG tablet Take 1 tablet (10 mg total) by mouth every 8 (eight) hours as needed for anxiety. 09/22/15   Fritzi Mandes, MD  fluticasone (FLONASE) 50 MCG/ACT nasal spray Place 2 sprays into both nostrils as needed. 09/22/17   Lada, Satira Anis, MD  fluticasone furoate-vilanterol (BREO ELLIPTA) 200-25 MCG/INH AEPB Inhale 1 puff into the lungs daily. Patient not taking: Reported on 05/10/2018 01/26/18   Bettey Costa, MD  ipratropium-albuterol (DUONEB) 0.5-2.5 (3) MG/3ML SOLN Take 3 mLs by nebulization every 4 (four) hours as needed. Patient not taking: Reported on 05/10/2018 01/26/18   Bettey Costa, MD  metoprolol tartrate (LOPRESSOR) 50 MG tablet TAKE 1 TABLET BY MOUTH TWICE A DAY 03/15/18   Lada, Satira Anis, MD  nitroGLYCERIN (NITROSTAT) 0.4 MG SL tablet Place 1 tablet (0.4 mg total) under the tongue every 5 (five) minutes as needed for chest pain. 01/26/18   Bettey Costa, MD  Omega-3 Fatty Acids (FISH OIL) 1000 MG CAPS Take 1 capsule by mouth daily.    [provider]  pantoprazole (PROTONIX) 40 MG tablet Take 1 tablet (40 mg  total) by mouth 2 (two) times daily before a meal for 30 days. 04/23/18 05/23/18  Virgel Manifold, MD  PRALUENT 75 MG/ML SOPN Inject 75 mLs as directed every 14 (fourteen) days. 11/25/16   [provider]  ranolazine (RANEXA) 500 MG 12 hr tablet Take 2 tablets (1,000 mg total) by mouth 2 (two) times daily for 15 days. 01/22/18 02/06/18  Carrie Mew, MD  triamcinolone cream (KENALOG) 0.1 % Apply 1 application topically 2 (two) times daily. As needed; too strong for face, under arms, in the groin Patient not taking: Reported on 05/10/2018 01/22/18   Arnetha Courser, MD    Allergies Patient has no known allergies.  Family History  Problem Relation Age of Onset  . Diabetes Mother   . CVA Mother   . Heart disease Father   . Heart disease Sister   . Heart disease Brother   . Heart disease Sister   . Heart disease Sister   . Heart disease Sister   . Heart disease Brother   . Heart disease Brother   . Heart disease Brother  Social History Social History   Tobacco Use  . Smoking status: Former Smoker    Packs/day: 1.00    Years: 30.00    Pack years: 30.00    Types: Cigarettes    Last attempt to quit: 2006    Years since quitting: 14.0  . Smokeless tobacco: Never Used  . Tobacco comment: smoking cessation materials not required  Substance Use Topics  . Alcohol use: Not Currently    Alcohol/week: 0.0 standard drinks    Frequency: Never  . Drug use: No    Review of Systems  Level 5 caveat secondary to altered mentation.   ____________________________________________   PHYSICAL EXAM:  VITAL SIGNS: ED Triage Vitals  Enc Vitals Group     BP 05/16/18 0834 (!) 149/75     Pulse Rate 05/16/18 0834 (!) 121     Resp 05/16/18 0834 (!) 22     Temp 05/16/18 0834 100.2 F (37.9 C)     Temp Source 05/16/18 0834 Oral     SpO2 05/16/18 0834 (!) 88 %     Weight 05/16/18 0836 208 lb 14.4 oz (94.8 kg)     Height 05/16/18 0836 5\' 5"  (1.651 m)     Head  Circumference --      Peak Flow --      Pain Score 05/16/18 0834 4     Pain Loc --      Pain Edu? --      Excl. in [place]? --     Constitutional: Alert and oriented to self. in no acute distress. Eyes: Conjunctivae are normal.  Head: Atraumatic. Nose: No congestion/rhinnorhea. Mouth/Throat: Mucous membranes are moist.  Neck: No stridor.   Cardiovascular: Normal rate, regular rhythm. Grossly normal heart sounds.   Respiratory: Normal respiratory effort.  No retractions.  Decreased air movement throughout all fields without any obvious audible wheezing or rales. Gastrointestinal: Soft and nontender. No distention.  Musculoskeletal: No lower extremity tenderness nor edema.  No joint effusions. Neurologic:  Normal speech and language. No gross focal neurologic deficits are appreciated. Skin:  Skin is warm, dry and intact. No rash noted.  ____________________________________________   LABS (all labs ordered are listed, but only abnormal results are displayed)  Labs Reviewed  CULTURE, BLOOD (ROUTINE X 2)  CULTURE, BLOOD (ROUTINE X 2)  URINE CULTURE  CBC WITH DIFFERENTIAL/PLATELET  LACTIC ACID, PLASMA  LACTIC ACID, PLASMA  COMPREHENSIVE METABOLIC PANEL  URINALYSIS, ROUTINE W REFLEX MICROSCOPIC  INFLUENZA PANEL BY PCR (TYPE A & B)   ____________________________________________  EKG  ED ECG REPORT I, Doran Stabler, the attending physician, personally viewed and interpreted this ECG.   Date: 05/16/2018  EKG Time: 0844  Rate: 129  Rhythm: sinus tachycardia  Axis: Normal  Intervals:none  ST&T Change: No ST segment elevation or depression.  No abnormal T wave inversion.  ____________________________________________  RADIOLOGY  Chest x-ray with evidence of multifocal pneumonia ____________________________________________   PROCEDURES  Procedure(s) performed:   .Critical Care Performed by: Orbie Pyo, MD Authorized by: Orbie Pyo, MD    Critical care provider statement:    Critical care time (minutes):  35   Critical care time was exclusive of:  Separately billable procedures and treating other patients   Critical care was necessary to treat or prevent imminent or life-threatening deterioration of the following conditions:  Sepsis   Critical care was time spent personally by me on the following activities:  Development of treatment plan with patient or surrogate, discussions with consultants, evaluation  of patient's response to treatment, examination of patient, obtaining history from patient or surrogate, ordering and performing treatments and interventions, ordering and review of laboratory studies, ordering and review of radiographic studies, pulse oximetry, re-evaluation of patient's condition and review of old [place] performed:   ____________________________________________   INITIAL IMPRESSION / [place] / ED COURSE  Pertinent labs & imaging results that were available during my care of the patient were reviewed by me and considered in my medical decision making (see chart for details).  Differential diagnosis includes, but is not limited to, alcohol, illicit or prescription medications, or other toxic ingestion; intracranial pathology such as stroke or intracerebral hemorrhage; fever or infectious causes including sepsis; hypoxemia and/or hypercarbia; uremia; trauma; endocrine related disorders such as diabetes, hypoglycemia, and thyroid-related diseases; hypertensive encephalopathy; etc. As part of my medical decision making, I reviewed the following data within the electronic MEDICAL RECORD NUMBER Notes from prior ED visits  ----------------------------------------- 9:18 AM on 05/16/2018 -----------------------------------------  Patient placed on supplemental oxygen.  Plan on admission to the hospital for multifocal pneumonia with altered mental status and evidence of sepsis.  Sepsis  protocol initiated.  Will give broad-spectrum antibiotics because of vital sign abnormality as well as altered mentation and multifocal pneumonia.  Patient to be admitted to the hospitalist. ____________________________________________   FINAL CLINICAL IMPRESSION(S) / ED DIAGNOSES  Multifocal pneumonia.  Sepsis.  NEW MEDICATIONS STARTED DURING THIS VISIT:  New Prescriptions   No medications on file     Note:  This document was prepared using Dragon voice recognition software and may include unintentional dictation errors.     Orbie Pyo, MD 05/16/18 [phone]  Patient signed out to Dr. Philis Kendall, Randall An, MD 05/16/18 [phone]

## 2018-05-16 NOTE — ED Notes (Signed)
Date and time results received: 05/16/18 9:29 AM  (use smartphrase ".now" to insert current time)  Test: Lactic Critical Value: 3.2  Name of Provider Notified: Clearnce Hasten, MD  Orders Received? Or Actions Taken?: Orders Received - See Orders for details

## 2018-05-16 NOTE — Progress Notes (Signed)
Pt placed on HFNC 7L due to PAO2 from ABG results in 50's

## 2018-05-16 NOTE — ED Notes (Signed)
ED TO INPATIENT HANDOFF REPORT  ED Nurse Name and Phone #:  Jinny Blossom [phone]  Name/Age/Gender Linda Mejia 63 y.o. female Room/Bed: ED03A/ED03A  Code Status   Code Status: Prior  Home/SNF/Other Home Patient oriented to: self, place and situation Is this baseline? No   Triage Complete: Triage complete  Chief Complaint Flu sx via EMS   Triage Note Pt comes via [facility] from home with c/o flu like symptoms that started Wednesday evening.  Pt begins mumbling words and not making sense. Pt applied mask over eyes and not able to follow simple commands.  Pt talking and not making sense. Pt alert to where she is. Pt states her daughter called EMS because she wasn't breathing good.  Pt tachy and O2 at 88% RA. 2L O2 applied at this time. MD Clearnce Hasten and RN Shaquel Josephson at bedside   Allergies No Known Allergies  Level of Care/Admitting Diagnosis ED Disposition    ED Disposition Condition [place]: [place] [[id number]]  Level of Care: Med-Surg [16]  Diagnosis: Sepsis [facility]) [[id number]]  Admitting Physician: [name] [[id number]]  Attending Physician: [name] [[id number]]  Estimated length of stay: 3 - 4 days  Certification:: I certify this patient will need inpatient services for at least 2 midnights  PT Class (Do Not Modify): Inpatient [101]  PT Acc Code (Do Not Modify): Private [1]       Medical/Surgery History Past Medical History:  Diagnosis Date  . Abnormal ankle brachial index (ABI) 12/02/2016  . ADHD (attention deficit hyperactivity disorder)   . Anxiety   . Aortic atherosclerosis ([place]) 12/02/2016   July 2018  . Chronic back pain   . Coronary artery disease   . Herpes genitalis in women   . History of stomach ulcers    x7 this year. Bleeding  . Hypertension   . Prediabetes 12/02/2016  . Pulmonary hypertension ([place]) 03/09/2018   Echo Oct 2019   Past Surgical History:  Procedure Laterality Date  . APPENDECTOMY    . CARDIAC  CATHETERIZATION    . CESAREAN SECTION    . COLON SURGERY     interception  . ESOPHAGOGASTRODUODENOSCOPY (EGD) WITH PROPOFOL N/A 09/21/2015   Procedure: ESOPHAGOGASTRODUODENOSCOPY (EGD) WITH PROPOFOL;  Surgeon: Lucilla Lame, MD;  Location: [facility] ENDOSCOPY;  Service: Endoscopy;  Laterality: N/A;  . ESOPHAGOGASTRODUODENOSCOPY (EGD) WITH PROPOFOL N/A 07/30/2016   Procedure: ESOPHAGOGASTRODUODENOSCOPY (EGD) WITH PROPOFOL;  Surgeon: Jonathon Bellows, MD;  Location: [facility] ENDOSCOPY;  Service: Endoscopy;  Laterality: N/A;  . ESOPHAGOGASTRODUODENOSCOPY (EGD) WITH PROPOFOL N/A 04/23/2018   Procedure: ESOPHAGOGASTRODUODENOSCOPY (EGD) WITH PROPOFOL;  Surgeon: Virgel Manifold, MD;  Location: [facility] ENDOSCOPY;  Service: Endoscopy;  Laterality: N/A;  . LEFT HEART CATH Right 01/25/2018   Procedure: Left Heart Cath and Coronary Angiography;  Surgeon: Dionisio David, MD;  Location: [place] CV LAB;  Service: Cardiovascular;  Laterality: Right;  . LEFT HEART CATH AND CORONARY ANGIOGRAPHY Right 08/12/2016   Procedure: Left Heart Cath and Coronary Angiography;  Surgeon: Dionisio David, MD;  Location: [place] CV LAB;  Service: Cardiovascular;  Laterality: Right;  . TONSILLECTOMY    . TUBAL LIGATION       IV Location/Drains/Wounds Patient Lines/Drains/Airways Status   Active Line/Drains/Airways    Name:   Placement date:   Placement time:   Site:   Days:   Peripheral IV 05/16/18 Left;Anterior   05/16/18    0858    -   less than 1   Peripheral  IV 05/16/18 Right Hand   05/16/18    0858    Hand   less than 1          Intake/Output Last 24 hours No intake or output data in the 24 hours ending 05/16/18 1033  Labs/Imaging Results for orders placed or performed during the hospital encounter of 05/16/18 (from the past 48 hour(s))  Lactic acid, plasma     Status: Abnormal   Collection Time: 05/16/18  8:52 AM  Result Value Ref Range   Lactic Acid, Venous 3.2 (HH) 0.5 - 1.9 mmol/L    Comment: CRITICAL RESULT  CALLED TO, READ BACK BY AND VERIFIED WITH Makell Drohan ON 05/16/2018 AT 9390 QSD Performed at [facility], [address], [place], [place] [address]   Influenza panel by PCR (type A & B)     Status: None   Collection Time: 05/16/18  8:52 AM  Result Value Ref Range   Influenza A By PCR NEGATIVE NEGATIVE   Influenza B By PCR NEGATIVE NEGATIVE    Comment: (NOTE) The Xpert Xpress Flu assay is intended as an aid in the diagnosis of  influenza and should not be used as a sole basis for treatment.  This  assay is FDA approved for nasopharyngeal swab specimens only. Nasal  washings and aspirates are unacceptable for Xpert Xpress Flu testing. Performed at [facility], [address] [place], [place] [address]   Comprehensive metabolic panel     Status: Abnormal   Collection Time: 05/16/18  8:53 AM  Result Value Ref Range   Sodium 135 135 - 145 mmol/L   Potassium 3.3 (L) 3.5 - 5.1 mmol/L   Chloride 96 (L) 98 - 111 mmol/L   CO2 29 22 - 32 mmol/L   Glucose, Bld 130 (H) 70 - 99 mg/dL   BUN 15 8 - 23 mg/dL   Creatinine, Ser 0.76 0.44 - 1.00 mg/dL   Calcium 8.6 (L) 8.9 - 10.3 mg/dL   Total Protein 7.5 6.5 - 8.1 g/dL   Albumin 4.0 3.5 - 5.0 g/dL   AST 26 15 - 41 U/L   ALT 16 0 - 44 U/L   Alkaline Phosphatase 61 38 - 126 U/L   Total Bilirubin 0.8 0.3 - 1.2 mg/dL   GFR calc non Af Amer >60 >60 mL/min   GFR calc Af Amer >60 >60 mL/min   Anion gap 10 5 - 15    Comment: Performed at [facility], [address] [place],  [address]  CBC WITH DIFFERENTIAL     Status: None   Collection Time: 05/16/18  8:53 AM  Result Value Ref Range   WBC 7.4 4.0 - 10.5 K/uL   RBC 4.03 3.87 - 5.11 MIL/uL   Hemoglobin 12.5 12.0 - 15.0 g/dL   HCT 38.5 36.0 - 46.0 %   MCV 95.5 80.0 - 100.0 fL   MCH 31.0 26.0 - 34.0 pg   MCHC 32.5 30.0 - 36.0 g/dL   RDW 13.1 11.5 - 15.5 %   Platelets 286 150 - 400 K/uL   nRBC 0.0 0.0 - 0.2 %   Neutrophils Relative % 85 %   Neutro Abs 6.2  1.7 - 7.7 K/uL   Lymphocytes Relative 12 %   Lymphs Abs 0.9 0.7 - 4.0 K/uL   Monocytes Relative 3 %   Monocytes Absolute 0.2 0.1 - 1.0 K/uL   Eosinophils Relative 0 %   Eosinophils Absolute 0.0 0.0 - 0.5 K/uL  Basophils Relative 0 %   Basophils Absolute 0.0 0.0 - 0.1 K/uL   WBC Morphology MORPHOLOGY UNREMARKABLE    Smear Review PLATELETS APPEAR ADEQUATE    Immature Granulocytes 0 %   Abs Immature Granulocytes 0.02 0.00 - 0.07 K/uL   Stomatocytes PRESENT     Comment: Performed at [facility], [address], [place],  [address]   Dg Chest Port 1 View  Result Date: 05/16/2018 CLINICAL DATA:  Shortness of breath EXAM: PORTABLE CHEST 1 VIEW COMPARISON:  03/26/2018 FINDINGS: Focal/patchy right upper lobe opacity, new. Patchy right lower lobe opacity, new. These findings are suspicious for multifocal pneumonia. Left lung is clear. No pleural effusion or pneumothorax. The heart is normal in size. IMPRESSION: Patchy right upper and lower lobe opacities, suspicious for multifocal pneumonia. Electronically Signed   By: Julian Hy M.D.   On: 05/16/2018 09:05    Pending Labs Unresulted Labs (From admission, onward)    Start     Ordered   05/16/18 0840  Lactic acid, plasma  Now then every 2 hours,   STAT     05/16/18 0840   05/16/18 0840  Blood Culture (routine x 2)  BLOOD CULTURE X 2,   STAT     05/16/18 0840   05/16/18 0840  Urinalysis, Routine w reflex microscopic  ONCE - STAT,   STAT     05/16/18 0840   05/16/18 0840  Urine culture  ONCE - STAT,   STAT     05/16/18 0840   Signed and Held  Basic metabolic panel  Tomorrow morning,   R     Signed and Held   Signed and Held  CBC  Tomorrow morning,   R     Signed and Held   Signed and Held  Creatinine, serum  (enoxaparin (LOVENOX)    CrCl >/= 30 ml/min)  Weekly,   R    Comments:  while on enoxaparin therapy    Signed and Held   Signed and Held  Magnesium  Add-on,   R     Signed and Held   Signed and Held   Procalcitonin  ONCE - STAT,   R     Signed and Held   Signed and Held  Protime-INR  ONCE - STAT,   R     Signed and Held   Signed and Held  APTT  ONCE - STAT,   R     Signed and Held   Signed and Held  Expectorated sputum assessment w rflx to resp cult  Once,   R    Question:  Patient immune status  Answer:  Normal   Signed and Held          Vitals/Pain Today's Vitals   05/16/18 0834 05/16/18 0836  BP: (!) 149/75   Pulse: (!) 121   Resp: (!) 22   Temp: 100.2 F (37.9 C)   TempSrc: Oral   SpO2: (!) 88%   Weight:  94.8 kg  Height:  5\' 5"  (1.651 m)  PainSc: 4      Isolation Precautions Droplet precaution  Medications Medications  vancomycin (VANCOCIN) IVPB 1000 mg/200 mL premix (1,000 mg Intravenous Transfusing/Transfer 05/16/18 1011)  ceFEPIme (MAXIPIME) 2 g in sodium chloride 0.9 % 100 mL IVPB (0 g Intravenous Stopped 05/16/18 1000)  sodium chloride 0.9 % bolus 1,000 mL (1,000 mLs Intravenous Transfusing/Transfer 05/16/18 1011)    Mobility walks with person assist High fall risk   Focused Assessments ED Sepsis Handoff  Recommendations: See Admitting Provider Note  Report given to:   Additional Notes: Flu negative

## 2018-05-16 NOTE — Progress Notes (Signed)
PHARMACY - PHYSICIAN COMMUNICATION CRITICAL VALUE ALERT - BLOOD CULTURE IDENTIFICATION (BCID)  Linda Mejia is an 62 y.o. female who presented to [facility] on 05/16/2018 with a chief complaint of Pneumonia   Assessment:  BCx showing Streptococcus pneumoniae  Name of physician (or Provider) Contacted: Maura Crandall, NP  Current antibiotics: Ceftriaxone and azithromycin   Changes to prescribed antibiotics recommended:  Patient is on recommended antibiotics - No changes needed  Results for orders placed or performed during the hospital encounter of 05/16/18  Blood Culture ID Panel (Reflexed) (Collected: 05/16/2018  8:52 AM)  Result Value Ref Range   Enterococcus species NOT DETECTED NOT DETECTED   Listeria monocytogenes NOT DETECTED NOT DETECTED   Staphylococcus species NOT DETECTED NOT DETECTED   Staphylococcus aureus (BCID) NOT DETECTED NOT DETECTED   Streptococcus species DETECTED (A) NOT DETECTED   Streptococcus agalactiae NOT DETECTED NOT DETECTED   Streptococcus pneumoniae DETECTED (A) NOT DETECTED   Streptococcus pyogenes NOT DETECTED NOT DETECTED   Acinetobacter baumannii NOT DETECTED NOT DETECTED   Enterobacteriaceae species NOT DETECTED NOT DETECTED   Enterobacter cloacae complex NOT DETECTED NOT DETECTED   Escherichia coli NOT DETECTED NOT DETECTED   Klebsiella oxytoca NOT DETECTED NOT DETECTED   Klebsiella pneumoniae NOT DETECTED NOT DETECTED   Proteus species NOT DETECTED NOT DETECTED   Serratia marcescens NOT DETECTED NOT DETECTED   Haemophilus influenzae NOT DETECTED NOT DETECTED   Neisseria meningitidis NOT DETECTED NOT DETECTED   Pseudomonas aeruginosa NOT DETECTED NOT DETECTED   Candida albicans NOT DETECTED NOT DETECTED   Candida glabrata NOT DETECTED NOT DETECTED   Candida krusei NOT DETECTED NOT DETECTED   Candida parapsilosis NOT DETECTED NOT DETECTED   Candida tropicalis NOT DETECTED NOT DETECTED    Pernell Dupre, PharmD, BCPS Clinical  Pharmacist 05/16/2018 11:29 PM

## 2018-05-16 NOTE — Significant Event (Signed)
Rapid Response Event Note  Overview: Time Called: 1207 Arrival Time: 1209 Event Type: Neurologic, Respiratory, MEWS  Initial Focused Assessment: called for RR pt just arrived from ED/ sepsis/ MEWS 5, decreased LOC, Resp Failure.   Interventions: Dr Bridgett Larsson to bedside, transfer to stepdown for bipap and closer monitoring. Daughter at bedside. Dr Jefferson Fuel consulted  [place] (if not transferred):  Event Summary: Name of Physician Notified: Dr Bridgett Larsson at 1239  Name of Consulting Physician Notified: [place] at 1225  Outcome: Transferred (Comment)(to stepdown)  Event End Time: [place]

## 2018-05-16 NOTE — Progress Notes (Signed)
CODE SEPSIS - PHARMACY COMMUNICATION  **Broad Spectrum Antibiotics should be administered within 1 hour of Sepsis diagnosis**  Time Code Sepsis Called/Page Received: 08:40  Antibiotics Ordered: Cefepime and Vancomycin  Time of 1st antibiotic administration: Cefepime given at 09:26  Additional action taken by pharmacy: N/A  If necessary, Name of Provider/Nurse Contacted: N/A    Vira Blanco ,PharmD Clinical Pharmacist  05/16/2018  9:29 AM

## 2018-05-16 NOTE — ED Notes (Signed)
First Nurse Note:  Flu like symptoms, starting at 2am with body aches. History of UTI as well.  Patient is alert and oriented x 4.

## 2018-05-16 NOTE — Progress Notes (Signed)
Attempted to speak with patient's nurse, who was at lunch at time of call.

## 2018-05-16 NOTE — H&P (Addendum)
[place] at [place] NAME: Linda Mejia    MR#:  [id number]  DATE OF BIRTH:  [birth year]  DATE OF ADMISSION:  05/16/2018  PRIMARY CARE PHYSICIAN: Arnetha Courser, MD   REQUESTING/REFERRING PHYSICIAN: Dr. Clearnce Hasten.  CHIEF COMPLAINT:   Chief Complaint  Patient presents with  . flu symptoms  . Altered Mental Status   Shortness of breath and cough since yesterday. HISTORY OF PRESENT ILLNESS:  Linda Mejia  is a 63 y.o. female with a known history of ADHD, aortic atherosclerosis, hypertension, pulmonary hypertension, CAD, chronic back pain, UTI and prediabetes.  The patient is sent to ED due to above chief complaints.  The patient complains of shortness of breath and a cough with sputum since yesterday.  She also complains of body aching, fever, chills, nausea and vomiting.  She is found septic with tachycardia, tachypnea and a lactic acidosis.  Chest x-ray showed right side multiple focal pneumonia. PAST MEDICAL HISTORY:   Past Medical History:  Diagnosis Date  . Abnormal ankle brachial index (ABI) 12/02/2016  . ADHD (attention deficit hyperactivity disorder)   . Anxiety   . Aortic atherosclerosis ([place]) 12/02/2016   July 2018  . Chronic back pain   . Coronary artery disease   . Herpes genitalis in women   . History of stomach ulcers    x7 this year. Bleeding  . Hypertension   . Prediabetes 12/02/2016  . Pulmonary hypertension ([place]) 03/09/2018   Echo Oct 2019    PAST SURGICAL HISTORY:   Past Surgical History:  Procedure Laterality Date  . APPENDECTOMY    . CARDIAC CATHETERIZATION    . CESAREAN SECTION    . COLON SURGERY     interception  . ESOPHAGOGASTRODUODENOSCOPY (EGD) WITH PROPOFOL N/A 09/21/2015   Procedure: ESOPHAGOGASTRODUODENOSCOPY (EGD) WITH PROPOFOL;  Surgeon: Lucilla Lame, MD;  Location: [facility] ENDOSCOPY;  Service: Endoscopy;  Laterality: N/A;  . ESOPHAGOGASTRODUODENOSCOPY (EGD) WITH PROPOFOL N/A 07/30/2016   Procedure:  ESOPHAGOGASTRODUODENOSCOPY (EGD) WITH PROPOFOL;  Surgeon: Jonathon Bellows, MD;  Location: [facility] ENDOSCOPY;  Service: Endoscopy;  Laterality: N/A;  . ESOPHAGOGASTRODUODENOSCOPY (EGD) WITH PROPOFOL N/A 04/23/2018   Procedure: ESOPHAGOGASTRODUODENOSCOPY (EGD) WITH PROPOFOL;  Surgeon: Virgel Manifold, MD;  Location: [facility] ENDOSCOPY;  Service: Endoscopy;  Laterality: N/A;  . LEFT HEART CATH Right 01/25/2018   Procedure: Left Heart Cath and Coronary Angiography;  Surgeon: Dionisio David, MD;  Location: [place] CV LAB;  Service: Cardiovascular;  Laterality: Right;  . LEFT HEART CATH AND CORONARY ANGIOGRAPHY Right 08/12/2016   Procedure: Left Heart Cath and Coronary Angiography;  Surgeon: Dionisio David, MD;  Location: [place] CV LAB;  Service: Cardiovascular;  Laterality: Right;  . TONSILLECTOMY    . TUBAL LIGATION      SOCIAL HISTORY:   Social History   Tobacco Use  . Smoking status: Former Smoker    Packs/day: 1.00    Years: 30.00    Pack years: 30.00    Types: Cigarettes    Last attempt to quit: 2006    Years since quitting: 14.0  . Smokeless tobacco: Never Used  . Tobacco comment: smoking cessation materials not required  Substance Use Topics  . Alcohol use: Not Currently    Alcohol/week: 0.0 standard drinks    Frequency: Never    FAMILY HISTORY:   Family History  Problem Relation Age of Onset  . Diabetes Mother   . CVA Mother   . Heart disease Father   . Heart  disease Sister   . Heart disease Brother   . Heart disease Sister   . Heart disease Sister   . Heart disease Sister   . Heart disease Brother   . Heart disease Brother   . Heart disease Brother     DRUG ALLERGIES:  No Known Allergies  REVIEW OF SYSTEMS:   Review of Systems  Constitutional: Positive for malaise/fatigue. Negative for chills and fever.  HENT: Negative for sore throat.   Eyes: Negative for blurred vision and double vision.  Respiratory: Positive for cough, sputum production and  shortness of breath. Negative for hemoptysis, wheezing and stridor.   Cardiovascular: Negative for chest pain, palpitations, orthopnea and leg swelling.  Gastrointestinal: Positive for nausea and vomiting. Negative for abdominal pain, blood in stool, diarrhea and melena.  Genitourinary: Negative for dysuria, flank pain and hematuria.  Musculoskeletal: Negative for back pain and joint pain.  Skin: Negative for rash.  Neurological: Negative for dizziness, sensory change, focal weakness, seizures, loss of consciousness, weakness and headaches.  Endo/Heme/Allergies: Negative for polydipsia.  Psychiatric/Behavioral: Negative for depression. The patient is not nervous/anxious.     MEDICATIONS AT HOME:   Prior to Admission medications   Medication Sig Start Date End Date Taking? Authorizing Provider  acyclovir (ZOVIRAX) 400 MG tablet take 1 tablet by mouth twice a day 11/13/17   Arnetha Courser, MD  amLODipine (NORVASC) 5 MG tablet TAKE 1 TABLET(5 MG) BY MOUTH DAILY FOR BLOOD PRESSURE 04/03/18   Lada, Satira Anis, MD  amphetamine-dextroamphetamine (ADDERALL) 30 MG tablet Take 30 mg by mouth 2 (two) times daily.  12/02/16   Arnetha Courser, MD  Ascorbic Acid (VITAMIN C) 100 MG tablet Take 100 mg by mouth daily.    [provider]  aspirin EC 81 MG tablet Take 81 mg by mouth daily.    [provider]  cholecalciferol (VITAMIN D) 1000 units tablet Take 1,000 Units by mouth daily.    [provider]  diazepam (VALIUM) 10 MG tablet Take 1 tablet (10 mg total) by mouth every 8 (eight) hours as needed for anxiety. 09/22/15   Fritzi Mandes, MD  fluticasone (FLONASE) 50 MCG/ACT nasal spray Place 2 sprays into both nostrils as needed. 09/22/17   Lada, Satira Anis, MD  fluticasone furoate-vilanterol (BREO ELLIPTA) 200-25 MCG/INH AEPB Inhale 1 puff into the lungs daily. Patient not taking: Reported on 05/10/2018 01/26/18   Bettey Costa, MD  ipratropium-albuterol (DUONEB) 0.5-2.5 (3) MG/3ML SOLN  Take 3 mLs by nebulization every 4 (four) hours as needed. Patient not taking: Reported on 05/10/2018 01/26/18   Bettey Costa, MD  metoprolol tartrate (LOPRESSOR) 50 MG tablet TAKE 1 TABLET BY MOUTH TWICE A DAY 03/15/18   Lada, Satira Anis, MD  nitroGLYCERIN (NITROSTAT) 0.4 MG SL tablet Place 1 tablet (0.4 mg total) under the tongue every 5 (five) minutes as needed for chest pain. 01/26/18   Bettey Costa, MD  Omega-3 Fatty Acids (FISH OIL) 1000 MG CAPS Take 1 capsule by mouth daily.    [provider]  pantoprazole (PROTONIX) 40 MG tablet Take 1 tablet (40 mg total) by mouth 2 (two) times daily before a meal for 30 days. 04/23/18 05/23/18  Virgel Manifold, MD  PRALUENT 75 MG/ML SOPN Inject 75 mLs as directed every 14 (fourteen) days. 11/25/16   [provider]  ranolazine (RANEXA) 500 MG 12 hr tablet Take 2 tablets (1,000 mg total) by mouth 2 (two) times daily for 15 days. 01/22/18 02/06/18  Carrie Mew, MD  triamcinolone cream (KENALOG) 0.1 % Apply 1 application topically 2 (two) times daily. As needed; too strong for face, under arms, in the groin Patient not taking: Reported on 05/10/2018 01/22/18   Arnetha Courser, MD      VITAL SIGNS:  Blood pressure (!) 149/75, pulse (!) 121, temperature 100.2 F (37.9 C), temperature source Oral, resp. rate (!) 22, height 5\' 5"  (1.651 m), weight 94.8 kg, SpO2 (!) 88 %.  PHYSICAL EXAMINATION:  Physical Exam  GENERAL:  64 y.o.-year-old patient lying in the bed with no acute distress.  EYES: Pupils equal, round, reactive to light and accommodation. No scleral icterus. Extraocular muscles intact.  HEENT: Head atraumatic, normocephalic. Oropharynx and nasopharynx clear.  NECK:  Supple, no jugular venous distention. No thyroid enlargement, no tenderness.  LUNGS: Diminished and coarse breath sounds bilaterally, no wheezing, rales,rhonchi or crepitation. No use of accessory muscles of respiration.  CARDIOVASCULAR: S1, S2 normal. No murmurs,  rubs, or gallops.  ABDOMEN: Soft, nontender, nondistended. Bowel sounds present. No organomegaly or mass.  EXTREMITIES: No pedal edema, cyanosis, or clubbing.  NEUROLOGIC: Cranial nerves II through XII are intact. Muscle strength 5/5 in all extremities. Sensation intact. Gait not checked.  PSYCHIATRIC: The patient is alert and oriented x 3.  SKIN: No obvious rash, lesion, or ulcer.   LABORATORY PANEL:   CBC Recent Labs  Lab 05/16/18 0853  WBC 7.4  HGB 12.5  HCT 38.5  PLT 286   ------------------------------------------------------------------------------------------------------------------  Chemistries  Recent Labs  Lab 05/16/18 0853  NA 135  K 3.3*  CL 96*  CO2 29  GLUCOSE 130*  BUN 15  CREATININE 0.76  CALCIUM 8.6*  AST 26  ALT 16  ALKPHOS 61  BILITOT 0.8   ------------------------------------------------------------------------------------------------------------------  Cardiac Enzymes No results for input(s): TROPONINI in the last 168 hours. ------------------------------------------------------------------------------------------------------------------  RADIOLOGY:  Dg Chest Port 1 View  Result Date: 05/16/2018 CLINICAL DATA:  Shortness of breath EXAM: PORTABLE CHEST 1 VIEW COMPARISON:  03/26/2018 FINDINGS: Focal/patchy right upper lobe opacity, new. Patchy right lower lobe opacity, new. These findings are suspicious for multifocal pneumonia. Left lung is clear. No pleural effusion or pneumothorax. The heart is normal in size. IMPRESSION: Patchy right upper and lower lobe opacities, suspicious for multifocal pneumonia. Electronically Signed   By: Julian Hy M.D.   On: 05/16/2018 09:05      IMPRESSION AND PLAN:   Acute respiratory failure with hypoxia due to sepsis and multifocal pneumonia. The patient will be admitted to medical floor. Continue oxygen by nasal cannula, albuterol every 6 hours, Robitussin as needed.  The patient is treated with  cefepime and vancomycin in the ED. Start Zithromax and Rocephin due to CAP.  IV fluid support.  Follow-up CBC and cultures.  Lactic acidosis.  Due to above.  Follow-up level.  Treatment as above. Hypokalemia.  Potassium supplement. CAD.  Continue aspirin. Hypertension.  Continue Lopressor but hold Norvasc. ADHD.  Continue home medication.  All the records are reviewed and case discussed with ED provider. Management plans discussed with the patient, family and they are in agreement.  CODE STATUS: DNR.  TOTAL TIME TAKING CARE OF THIS PATIENT: 32 minutes.    [name] M.D on 05/16/2018 at 9:49 AM  Between 7am to 6pm - Pager - [phone]  After 6pm go to [email or web address] - Proofreader  [facility]  Office  [phone]  CC: Primary care physician; Arnetha Courser, MD   Note: This dictation was prepared with Dragon dictation  along with smaller phrase technology. Any transcriptional errors that result from this process are unin

## 2018-05-16 NOTE — Progress Notes (Signed)
Pastoral Care Rapid Response Visit    05/16/18 1208  Clinical Encounter Type  Visited With Patient and family together;Health care provider  Visit Type Initial;Psychological support;Critical Care;Other (Comment) (Rapid Response Code)  Referral From Nurse  Spiritual Encounters  Spiritual Needs Emotional  Stress Factors  Patient Stress Factors Not reviewed  Family Stress Factors Health changes   Pt was being moved due to health concerns and a Rapid Response Code came into [place] pager.  Melven Sartorius arrived to find daughter scared due to the presence of a chaplain. She was afraid that this was really bad.  I comforted her and assured her that I was there to assist in caring for family and that I was more in the role of hospitality so that she would be calm.  Melven Sartorius escorted the daughter to CCU waiting room, comforted daughter, and took her back to pt's room once the pt was settled.   Darcey Nora, Chaplain

## 2018-05-16 NOTE — Progress Notes (Signed)
Response was called due to patient is in distress.  Tachycardia at 127.  Lactic acid is up to 5.3.  The patient is on oxygen 3 L by nasal cannula.  Stat ABG.  Transfer to stepdown unit. I discussed with Dr. Jefferson Fuel and ICU nurse. Called the patient's daughter but nobody answered the phone. Per RN, her daughter knows the patient condition and is talking with chaplain now.  Time spent about 20 minutes.

## 2018-05-16 NOTE — ED Triage Notes (Addendum)
Pt comes via [facility] from home with c/o flu like symptoms that started Wednesday evening.  Pt begins mumbling words and not making sense. Pt applied mask over eyes and not able to follow simple commands.  Pt talking and not making sense. Pt alert to where she is. Pt states her daughter called EMS because she wasn't breathing good.  Pt tachy and O2 at 88% RA. 2L O2 applied at this time. MD Schaevitz and RN Megan at bedside

## 2018-05-16 NOTE — Progress Notes (Signed)
Advanced Care Plan.  Purpose of Encounter: CODE STATUS. Parties in Attendance: The patient and me. Patient's Decisional Capacity: Yes. Medical Story: Linda Mejia  is a 63 y.o. female with a known history of ADHD, aortic atherosclerosis, hypertension, pulmonary hypertension, CAD, chronic back pain, UTI and prediabetes.    The patient is being admitted for acute respiratory failure with hypoxia due to sepsis and multifocal pneumonia.  I discussed with the patient about her current condition, prognosis and the CODE STATUS.  The patient is alert, awake and orientated x3.  She does not want to be resuscitated and intubated if she has cardiopulmonary arrest.  I asked her to repeat my question about CODE STATUS and she repeated correctly.  She still stated that she does not want to be resuscitated and intubated if she has cardiopulmonary rest. Plan:  Code Status: DNR. Time spent discussing advance care planning: 18 minutes.

## 2018-05-17 LAB — CBC
HCT: 30.6 % — ABNORMAL LOW (ref 36.0–46.0)
Hemoglobin: 9.9 g/dL — ABNORMAL LOW (ref 12.0–15.0)
MCH: 31.5 pg (ref 26.0–34.0)
MCHC: 32.4 g/dL (ref 30.0–36.0)
MCV: 97.5 fL (ref 80.0–100.0)
Platelets: 268 10*3/uL (ref 150–400)
RBC: 3.14 MIL/uL — ABNORMAL LOW (ref 3.87–5.11)
RDW: 13.9 % (ref 11.5–15.5)
WBC: 21.7 10*3/uL — ABNORMAL HIGH (ref 4.0–10.5)
nRBC: 0 % (ref 0.0–0.2)

## 2018-05-17 LAB — BASIC METABOLIC PANEL
Anion gap: 5 (ref 5–15)
BUN: 20 mg/dL (ref 8–23)
CO2: 21 mmol/L — ABNORMAL LOW (ref 22–32)
Calcium: 6.6 mg/dL — ABNORMAL LOW (ref 8.9–10.3)
Chloride: 111 mmol/L (ref 98–111)
Creatinine, Ser: 0.95 mg/dL (ref 0.44–1.00)
GFR calc Af Amer: >60 mL/min (ref >60)
GFR calc non Af Amer: >60 mL/min (ref >60)
Glucose, Bld: 137 mg/dL — ABNORMAL HIGH (ref 70–99)
Potassium: 4 mmol/L (ref 3.5–5.1)
Sodium: 137 mmol/L (ref 135–145)

## 2018-05-17 LAB — URINE CULTURE: Culture: NO GROWTH

## 2018-05-17 LAB — MAGNESIUM: Magnesium: 2.6 mg/dL — ABNORMAL HIGH (ref 1.7–2.4)

## 2018-05-17 LAB — ALBUMIN: Albumin: 2.6 g/dL — ABNORMAL LOW (ref 3.5–5.0)

## 2018-05-17 LAB — PHOSPHORUS: Phosphorus: 2.8 mg/dL (ref 2.5–4.6)

## 2018-05-17 MED ORDER — VANCOMYCIN HCL 10 G IV SOLR
1250.0000 mg | INTRAVENOUS | Status: DC
  Administered 2018-05-17 – 2018-05-18 (×2): 1250 mg via INTRAVENOUS
  Filled 2018-05-17 (×2): qty 1250

## 2018-05-17 MED ORDER — LACTATED RINGERS IV SOLN
INTRAVENOUS | Status: DC
  Administered 2018-05-17 – 2018-05-22 (×10): via INTRAVENOUS

## 2018-05-17 MED ORDER — PIPERACILLIN-TAZOBACTAM 3.375 G IVPB
3.3750 g | Freq: Three times a day (TID) | INTRAVENOUS | Status: DC
  Administered 2018-05-17 – 2018-05-18 (×4): 3.375 g via INTRAVENOUS
  Filled 2018-05-17 (×4): qty 50

## 2018-05-17 NOTE — Progress Notes (Signed)
[place] at [place] NAME: Linda Mejia    MR#:  [id number]  DATE OF BIRTH:  1956-02-12  SUBJECTIVE:  CHIEF COMPLAINT:   Chief Complaint  Patient presents with  . flu symptoms  . Altered Mental Status  critically sick, on pressors  REVIEW OF SYSTEMS:  Review of Systems  Constitutional: Negative for diaphoresis, fever, malaise/fatigue and weight loss.  HENT: Negative for ear discharge, ear pain, hearing loss, nosebleeds, sore throat and tinnitus.   Eyes: Negative for blurred vision and pain.  Respiratory: Negative for cough, hemoptysis, shortness of breath and wheezing.   Cardiovascular: Negative for chest pain, palpitations, orthopnea and leg swelling.  Gastrointestinal: Negative for abdominal pain, blood in stool, constipation, diarrhea, heartburn, nausea and vomiting.  Genitourinary: Negative for dysuria, frequency and urgency.  Musculoskeletal: Negative for back pain and myalgias.  Skin: Negative for itching and rash.  Neurological: Negative for dizziness, tingling, tremors, focal weakness, seizures, weakness and headaches.  Psychiatric/Behavioral: Negative for depression. The patient is not nervous/anxious.     DRUG ALLERGIES:  No Known Allergies VITALS:  Blood pressure (!) 84/62, pulse 93, temperature (!) 97.4 F (36.3 C), temperature source Oral, resp. rate 19, height 5\' 5"  (1.651 m), weight 98.4 kg, SpO2 97 %. PHYSICAL EXAMINATION:  Physical Exam Constitutional:      Appearance: She is ill-appearing and toxic-appearing.  HENT:     Head: Normocephalic and atraumatic.  Eyes:     Conjunctiva/sclera: Conjunctivae normal.     Pupils: Pupils are equal, round, and reactive to light.  Neck:     Musculoskeletal: Normal range of motion and neck supple.     Thyroid: No thyromegaly.     Trachea: No tracheal deviation.  Cardiovascular:     Rate and Rhythm: Normal rate and regular rhythm.     Heart sounds: Normal heart sounds.    Pulmonary:     Effort: Pulmonary effort is normal. No respiratory distress.     Breath sounds: Examination of the right-lower field reveals rhonchi. Examination of the left-lower field reveals rhonchi. Decreased breath sounds and rhonchi present. No wheezing.  Chest:     Chest wall: No tenderness.  Abdominal:     General: Bowel sounds are normal. There is no distension.     Palpations: Abdomen is soft.     Tenderness: There is no abdominal tenderness.  Musculoskeletal: Normal range of motion.  Skin:    General: Skin is warm and dry.     Findings: No rash.  Neurological:     Mental Status: She is alert and oriented to person, place, and time.     Cranial Nerves: No cranial nerve deficit.    LABORATORY PANEL:  Female CBC Recent Labs  Lab 05/17/18 0539  WBC 21.7*  HGB 9.9*  HCT 30.6*  PLT 268   ------------------------------------------------------------------------------------------------------------------ Chemistries  Recent Labs  Lab 05/16/18 0853  05/17/18 0539  NA 135   < > 137  K 3.3*   < > 4.0  CL 96*   < > 111  CO2 29   < > 21*  GLUCOSE 130*   < > 137*  BUN 15   < > 20  CREATININE 0.76   < > 0.95  CALCIUM 8.6*   < > 6.6*  MG  --    < > 2.6*  AST 26  --   --   ALT 16  --   --   ALKPHOS 61  --   --  BILITOT 0.8  --   --    < > = values in this interval not displayed.   RADIOLOGY:  No results found. ASSESSMENT AND PLAN:   * Acute respiratory failure with hypoxia due to sepsis and multifocal pneumonia. - had RR on floor requiring transfer to ICU y'day - on Vanco + zosyn  * Septic shock/Lactic acidosis.  Due to above. Due to pna, mgmt as above - on pressors  Hypokalemia.  Potassium supplement. CAD.  Continue aspirin. Hypertension.  Continue Lopressor but hold Norvasc. ADHD.  Continue home medication.     All the records are reviewed and case discussed with Care Management/Social Worker. Management plans discussed with the patient, family and they  are in agreement.  CODE STATUS: Full Code  TOTAL TIME TAKING CARE OF THIS PATIENT: 15 minutes.   More than 50% of the time was spent in counseling/coordination of care: YES  POSSIBLE D/C IN 2-3 DAYS, DEPENDING ON CLINICAL CONDITION.   Max Sane M.D on 05/17/2018 at 5:26 PM  Between 7am to 6pm - Pager - [phone]  After 6pm go to [email or web address] - Proofreader  [facility]  Office  [phone]  CC: Primary care physician; Arnetha Courser, MD  Note: This dictation was prepared with Dragon dictation along with smaller phrase technology. Any transcriptional errors that result from this process are unintentional.

## 2018-05-17 NOTE — Care Management Note (Addendum)
Case Management Note  Patient Details  Name: Linda Mejia MRN: [id number] Date of Birth: 08-Jun-1955  Subjective/Objective:                   RNCM met with patient to discuss transition of care. She is not on home O2.  She is independent at home where she lives alone. She denies problems paying for medications. Her PCP is Dr. Sanda Klein.  She has not DME in home. Action/Plan:   Home health list provided to patient for review per [email or web address]. Per RN patient is somewhat altered mental status.   Expected Discharge Date:                  Expected Discharge Plan:     In-House Referral:     Discharge planning Services  CM Consult  Post Acute Care Choice:  Home Health, Durable Medical Equipment Choice offered to:  Patient  DME Arranged:    DME Agency:     HH Arranged:    [place] Agency:     Status of Service:  In process, will continue to follow  If discussed at Long Length of Stay Meetings, dates discussed:    Additional Comments:  Marshell Garfinkel, RN 05/17/2018, 9:59 AM

## 2018-05-17 NOTE — Progress Notes (Signed)
CRITICAL CARE NOTE  CC  follow up respiratory failure secondary to septic shock, community-acquired pneumonia, streptococcal bacteremia.  SUBJECTIVE Patient remains critically ill, continues to require vasopressor support.  Complains of chest pain worsening with respiration worse on the left side.      SIGNIFICANT EVENTS -Worsening leukocytosis, broadening empiric regimen.   BP 92/64 (BP Location: Left Arm)   Pulse (!) 108   Temp 98 F (36.7 C) (Oral)   Resp 17   Ht 5\' 5"  (1.651 m)   Wt 98.4 kg   SpO2 90%   BMI 36.10 kg/m    REVIEW OF SYSTEMS  Review of Systems  Constitutional: Positive for diaphoresis. Negative for fever and weight loss.  HENT: Negative for hearing loss and nosebleeds.   Eyes: Positive for discharge.  Respiratory: Negative for hemoptysis.   Cardiovascular: Positive for chest pain. Negative for palpitations and leg swelling.  Gastrointestinal: Positive for abdominal pain.  Genitourinary: Positive for hematuria.  Musculoskeletal: Positive for back pain.  Skin: Negative for rash.  Neurological: Negative for weakness.  Endo/Heme/Allergies: Positive for environmental allergies. Does not bruise/bleed easily.  Psychiatric/Behavioral: Negative for substance abuse. The patient does not have insomnia.      PHYSICAL EXAMINATION:  GENERAL:critically ill appearing, +resp distress HEAD: Normocephalic, atraumatic.  EYES: Pupils equal, round, reactive to light.  No scleral icterus.  MOUTH: Moist mucosal membrane. NECK: Supple. No thyromegaly. No nodules. No JVD.  PULMONARY: +rhonchi, +wheezing CARDIOVASCULAR: S1 and S2. Regular rate and rhythm. No murmurs, rubs, or gallops.  GASTROINTESTINAL: Soft, nontender, -distended. No masses. Positive bowel sounds. No hepatosplenomegaly.  MUSCULOSKELETAL: No swelling, clubbing, or edema.  NEUROLOGIC: obtunded, GCS<8 SKIN:intact,warm,dry      Indwelling Urinary Catheter continued, requirement due to   Reason to  continue Indwelling Urinary Catheter for strict Intake/Output monitoring for hemodynamic instability   Central Line continued, requirement due to   Reason to continue [employer] Monitoring of central venous pressure or other hemodynamic parameters   Ventilator continued, requirement due to, resp failure    Ventilator Sedation RASS 0 to -2     ASSESSMENT AND PLAN SYNOPSIS   Severe Hypoxic and Hypercapnic Respiratory Failure Due to community-acquired pneumonia -Overnight leukocytosis trending up, have upgraded empiric regiment of Vanco and Zosyn -Continues to require vasopressor support -On nasal cannula, desaturation with speech and movement in bed    Acute kidney injury - KDIGO1 -Resolved   -hypokalemia-repleted   -Hypocalcemia- secondary to low albumin from malnutrition   -Protein calorie malnutrition moderate -Albumin 2.6     Septic shock -use vasopressors to keep MAP>65 -With bacteremia due to Streptococcus -Broaden regimen to vancomycin and Zosyn due to worsening leukocytosis  CARDIAC ICU monitoring    GI/Nutrition GI PROPHYLAXIS as indicated DIET-->TF's as tolerated Constipation protocol as indicated  ENDO - ICU hypoglycemic\Hyperglycemia protocol -check FSBS per protocol   DVT/GI PRX ordered TRANSFUSIONS AS NEEDED MONITOR FSBS ASSESS the need for LABS as needed    Critical Care Time devoted to patient care services described in this note is 32 minutes.     Overall, patient is critically ill, prognosis is guarded.  Patient with Multiorgan failure and at high risk for cardiac arrest and death.     Ottie Glazier, M.D.  Pulmonary & Critical Care Medicine

## 2018-05-17 NOTE — Progress Notes (Signed)
Pharmacy Antibiotic Note  Linda Mejia is a 63 y.o. female admitted on 05/16/2018 from home with multi lobar pneumonia with signs of sepsis. Pt has a PMH of htn, gastric ulcer disease, CAD, anxiety, ADHD, PVD, and pulmonary HTN.  Pharmacy has been consulted for vancomycin and Zosyn dosing.  Plan: 1) Initiate vancomycin  IV 1250mg  q24h.  Scr used: 0.95 Expected AUC: 499.7 Goal AUC: 400-550.  2) Initiate Zosyn IV 3.375g q8h.   Monitor renal function and cbc.   Height: 5\' 5"  (165.1 cm) Weight: 216 lb 14.9 oz (98.4 kg) IBW/kg (Calculated) : 57  Temp (24hrs), Avg:98.3 F (36.8 C), Min:97.5 F (36.4 C), Max:99 F (37.2 C)  Recent Labs  Lab 05/16/18 0852 05/16/18 0853 05/16/18 1127 05/16/18 1316 05/16/18 2016 05/16/18 2247 05/17/18 0539  WBC  --  7.4  --   --   --   --  21.7*  CREATININE  --  0.76  --   --  1.10*  --  0.95  LATICACIDVEN 3.2*  --  5.3* 4.5* 3.0* 3.1*  --     Estimated Creatinine Clearance: 72.3 mL/min (by C-G formula based on SCr of 0.95 mg/dL).    No Known Allergies  Antimicrobials this admission: 2/2 Zosyn >> 2/2 vancomycin >>  2/2 ceftriaxone >> 2/3 2/2 Cefepime x 1 dose 2/2 Azithromycin x 1 dose   Microbiology results: 2/2 BCx: streptococcus pneumoniae 2/2 UCx: pending 2/2 Sputum: pending 2/2 MRSA PCR: negative  Thank you for allowing pharmacy to be a part of this patient's care.  [place] 05/17/2018 11:38 AM  Pharmacy Student

## 2018-05-18 LAB — BASIC METABOLIC PANEL
Anion gap: 4 — ABNORMAL LOW (ref 5–15)
Anion gap: 4 — ABNORMAL LOW (ref 5–15)
BUN: 11 mg/dL (ref 8–23)
BUN: 10 mg/dL (ref 8–23)
CO2: 24 mmol/L (ref 22–32)
CO2: 27 mmol/L (ref 22–32)
Calcium: 6.7 mg/dL — ABNORMAL LOW (ref 8.9–10.3)
Calcium: 6.8 mg/dL — ABNORMAL LOW (ref 8.9–10.3)
Chloride: 107 mmol/L (ref 98–111)
Chloride: 105 mmol/L (ref 98–111)
Creatinine, Ser: 0.67 mg/dL (ref 0.44–1.00)
Creatinine, Ser: 0.62 mg/dL (ref 0.44–1.00)
GFR calc Af Amer: >60 mL/min (ref >60)
GFR calc Af Amer: >60 mL/min (ref >60)
GFR calc non Af Amer: >60 mL/min (ref >60)
GFR calc non Af Amer: >60 mL/min (ref >60)
Glucose, Bld: 114 mg/dL — ABNORMAL HIGH (ref 70–99)
Glucose, Bld: 139 mg/dL — ABNORMAL HIGH (ref 70–99)
Potassium: 3.9 mmol/L (ref 3.5–5.1)
Potassium: 3.6 mmol/L (ref 3.5–5.1)
Sodium: 135 mmol/L (ref 135–145)
Sodium: 136 mmol/L (ref 135–145)

## 2018-05-18 LAB — CBC WITH DIFFERENTIAL/PLATELET
Abs Immature Granulocytes: 0 10*3/uL (ref 0.00–0.07)
Band Neutrophils: 14 %
Basophils Absolute: 0 10*3/uL (ref 0.0–0.1)
Basophils Relative: 0 %
Eosinophils Absolute: 0 10*3/uL (ref 0.0–0.5)
Eosinophils Relative: 0 %
HCT: 28.8 % — ABNORMAL LOW (ref 36.0–46.0)
Hemoglobin: 9 g/dL — ABNORMAL LOW (ref 12.0–15.0)
Lymphocytes Relative: 2 %
Lymphs Abs: 0.4 10*3/uL — ABNORMAL LOW (ref 0.7–4.0)
MCH: 31 pg (ref 26.0–34.0)
MCHC: 31.3 g/dL (ref 30.0–36.0)
MCV: 99.3 fL (ref 80.0–100.0)
Monocytes Absolute: 0.4 10*3/uL (ref 0.1–1.0)
Monocytes Relative: 2 %
Neutro Abs: 20.6 10*3/uL — ABNORMAL HIGH (ref 1.7–7.7)
Neutrophils Relative %: 82 %
Platelets: 238 10*3/uL (ref 150–400)
RBC: 2.9 MIL/uL — ABNORMAL LOW (ref 3.87–5.11)
RDW: 14 % (ref 11.5–15.5)
WBC: 21.5 10*3/uL — ABNORMAL HIGH (ref 4.0–10.5)
nRBC: 0 % (ref 0.0–0.2)

## 2018-05-18 MED ORDER — VANCOMYCIN HCL 10 G IV SOLR
1500.0000 mg | INTRAVENOUS | Status: DC
  Filled 2018-05-18: qty 1500

## 2018-05-18 MED ORDER — SODIUM CHLORIDE 0.9 % IV SOLN
2.0000 g | Freq: Every day | INTRAVENOUS | Status: DC
  Administered 2018-05-18 – 2018-05-20 (×3): 2 g via INTRAVENOUS
  Filled 2018-05-18: qty 20
  Filled 2018-05-18 (×3): qty 2

## 2018-05-18 NOTE — Progress Notes (Signed)
[place] at [place] NAME: Linda Mejia    MR#:  [id number]  DATE OF BIRTH:  1955-08-17  SUBJECTIVE:  CHIEF COMPLAINT:   Chief Complaint  Patient presents with  . flu symptoms  . Altered Mental Status  critically sick, on pressors, awake. HR up REVIEW OF SYSTEMS:  Review of Systems  Constitutional: Negative for diaphoresis, fever, malaise/fatigue and weight loss.  HENT: Negative for ear discharge, ear pain, hearing loss, nosebleeds, sore throat and tinnitus.   Eyes: Negative for blurred vision and pain.  Respiratory: Negative for cough, hemoptysis, shortness of breath and wheezing.   Cardiovascular: Negative for chest pain, palpitations, orthopnea and leg swelling.  Gastrointestinal: Negative for abdominal pain, blood in stool, constipation, diarrhea, heartburn, nausea and vomiting.  Genitourinary: Negative for dysuria, frequency and urgency.  Musculoskeletal: Negative for back pain and myalgias.  Skin: Negative for itching and rash.  Neurological: Negative for dizziness, tingling, tremors, focal weakness, seizures, weakness and headaches.  Psychiatric/Behavioral: Negative for depression. The patient is not nervous/anxious.    DRUG ALLERGIES:  No Known Allergies VITALS:  Blood pressure 107/68, pulse (!) 114, temperature 98.6 F (37 C), resp. rate 19, height 5\' 5"  (1.651 m), weight 98.4 kg, SpO2 91 %. PHYSICAL EXAMINATION:  Physical Exam Constitutional:      Appearance: She is ill-appearing and toxic-appearing.  HENT:     Head: Normocephalic and atraumatic.  Eyes:     Conjunctiva/sclera: Conjunctivae normal.     Pupils: Pupils are equal, round, and reactive to light.  Neck:     Musculoskeletal: Normal range of motion and neck supple.     Thyroid: No thyromegaly.     Trachea: No tracheal deviation.  Cardiovascular:     Rate and Rhythm: Normal rate and regular rhythm.     Heart sounds: Normal heart sounds.  Pulmonary:     Effort:  Pulmonary effort is normal. No respiratory distress.     Breath sounds: Examination of the right-lower field reveals rhonchi. Examination of the left-lower field reveals rhonchi. Decreased breath sounds and rhonchi present. No wheezing.  Chest:     Chest wall: No tenderness.  Abdominal:     General: Bowel sounds are normal. There is no distension.     Palpations: Abdomen is soft.     Tenderness: There is no abdominal tenderness.  Musculoskeletal: Normal range of motion.  Skin:    General: Skin is warm and dry.     Findings: No rash.  Neurological:     Mental Status: She is alert and oriented to person, place, and time.     Cranial Nerves: No cranial nerve deficit.    LABORATORY PANEL:  Female CBC Recent Labs  Lab 05/18/18 0454  WBC 21.5*  HGB 9.0*  HCT 28.8*  PLT 238   ------------------------------------------------------------------------------------------------------------------ Chemistries  Recent Labs  Lab 05/16/18 0853  05/17/18 0539  05/18/18 1758  NA 135   < > 137   < > 136  K 3.3*   < > 4.0   < > 3.6  CL 96*   < > 111   < > 105  CO2 29   < > 21*   < > 27  GLUCOSE 130*   < > 137*   < > 139*  BUN 15   < > 20   < > 10  CREATININE 0.76   < > 0.95   < > 0.62  CALCIUM 8.6*   < > 6.6*   < >  6.8*  MG  --    < > 2.6*  --   --   AST 26  --   --   --   --   ALT 16  --   --   --   --   ALKPHOS 61  --   --   --   --   BILITOT 0.8  --   --   --   --    < > = values in this interval not displayed.   RADIOLOGY:  No results found. ASSESSMENT AND PLAN:   * Acute respiratory failure with hypoxia due to sepsis and multifocal pneumonia. - on Vanco + rocephin  * Septic shock/Lactic acidosis.  Due to above. Due to pna, mgmt as above - on pressors  Hypokalemia: replete and recheck CAD.  Continue aspirin. Hypertension.  Continue Lopressor but hold Norvasc. ADHD.  Continue home medication.     All the records are reviewed and case discussed with Care Management/Social  Worker. Management plans discussed with the patient, nursing and they are in agreement.  CODE STATUS: Full Code  TOTAL TIME TAKING CARE OF THIS PATIENT: 15 minutes.   More than 50% of the time was spent in counseling/coordination of care: YES  POSSIBLE D/C IN 2-3 DAYS, DEPENDING ON CLINICAL CONDITION.   Max Sane M.D on 05/18/2018 at 7:45 PM  Between 7am to 6pm - Pager - [phone]  After 6pm go to [email or web address] - Proofreader  [facility]  Office  [phone]  CC: Primary care physician; Arnetha Courser, MD  Note: This dictation was prepared with Dragon dictation along with smaller phrase technology. Any transcriptional errors that result from this process are unintentional.

## 2018-05-18 NOTE — Progress Notes (Addendum)
Pharmacy Antibiotic Note  Linda Mejia is a 63 y.o. female admitted on 05/16/2018 from home with multilobar pneumonia with signs of septic shock. Patient found to have strep pneumoniae bacteremia secondary to PNA. Patient has order for pressors.  Pharmacy has been consulted for vancomycin dosing.  Plan: Increase vancomycin  IV to 1500 q24h as renal function (Scr) has improved (starting on 2/5 as patient received 2/4 dose) Scr used: 0.8 (actual 0.67) Vd used (0.5 L) Expected AUC: 511.9 Goal AUC: 400-550.  Initiated Ceftriaxone 2 g IV q24h.  Zosyn discontinued today.  Monitor renal function and cbc.   Height: 5\' 5"  (165.1 cm) Weight: 216 lb 14.9 oz (98.4 kg) IBW/kg (Calculated) : 57  Temp (24hrs), Avg:97.8 F (36.6 C), Min:97.4 F (36.3 C), Max:98.6 F (37 C)  Recent Labs  Lab 05/16/18 0852 05/16/18 0853 05/16/18 1127 05/16/18 1316 05/16/18 2016 05/16/18 2247 05/17/18 0539 05/18/18 0454  WBC  --  7.4  --   --   --   --  21.7* 21.5*  CREATININE  --  0.76  --   --  1.10*  --  0.95 0.67  LATICACIDVEN 3.2*  --  5.3* 4.5* 3.0* 3.1*  --   --     Estimated Creatinine Clearance: 85.8 mL/min (by C-G formula based on SCr of 0.67 mg/dL).    No Known Allergies   Dose Adjustments 2/5 Increase Vancomycin to 1500 mg IV q24h (starting with 2/5 dose - pt received 2/4 dose)  Antimicrobials this admission: 2/2 Zosyn >> 2/4 2/2 vancomycin >>  2/2 ceftriaxone >> 2/3 >> 2/2 Cefepime x 1 dose 2/2 Azithromycin x 1 dose  Microbiology results: 2/3 BCx NGTD 2/2 BCx: streptococcus pneumoniae 2/2 UCx: NG 2/2 Sputum: pending 2/2 MRSA PCR: negative  Thank you for allowing pharmacy to be a part of this patient's care.  Paticia Stack, PharmD Pharmacy Resident  05/18/2018 2:41 PM

## 2018-05-18 NOTE — Progress Notes (Signed)
CRITICAL CARE NOTE  CC  follow up respiratory failure secondary to septic shock with pneumonia  SUBJECTIVE Patient remains critically ill Prognosis is guarded     SIGNIFICANT EVENTS  Pt continues to require pressor support.  Mentation improved.   BP 105/66   Pulse (!) 103   Temp 98 F (36.7 C)   Resp 16   Ht 5\' 5"  (1.651 m)   Wt 98.4 kg   SpO2 96%   BMI 36.10 kg/m    REVIEW OF SYSTEMS  Review of Systems  Constitutional: Negative for chills and malaise/fatigue.  HENT: Negative for hearing loss and nosebleeds.   Eyes: Negative for pain.  Respiratory: Negative for cough.   Cardiovascular: Negative for chest pain.  Gastrointestinal: Positive for abdominal pain.  Genitourinary: Negative for urgency.  Musculoskeletal: Negative for myalgias.  Skin: Negative for rash.  Neurological: Negative for tingling.  Endo/Heme/Allergies: Negative for polydipsia.  Psychiatric/Behavioral: Negative for suicidal ideas.     PHYSICAL EXAMINATION:  GENERAL:critically ill appearing, +resp distress HEAD: Normocephalic, atraumatic.  EYES: Pupils equal, round, reactive to light.  No scleral icterus.  MOUTH: Moist mucosal membrane. NECK: Supple. No thyromegaly. No nodules. No JVD.  PULMONARY: +rhonchi, +wheezing CARDIOVASCULAR: S1 and S2. Regular rate and rhythm. No murmurs, rubs, or gallops.  GASTROINTESTINAL: Soft, nontender, -distended. No masses. Positive bowel sounds. No hepatosplenomegaly.  MUSCULOSKELETAL: No swelling, clubbing, or edema.  NEUROLOGIC: obtunded, GCS<8 SKIN:intact,warm,dry      Indwelling Urinary Catheter continued, requirement due to   Reason to continue Indwelling Urinary Catheter for strict Intake/Output monitoring for hemodynamic instability   Central Line continued, requirement due to   Reason to continue [employer] Monitoring of central venous pressure or other hemodynamic parameters   Ventilator continued, requirement due to, resp failure     Ventilator Sedation RASS 0 to -2    ASSESSMENT AND PLAN SYNOPSIS   Severe Hypoxic and Hypercapnic Respiratory Failure Due to community-acquired pneumonia -CXR with multilobar pneumonia  -Overnight leukocytosis trending up, have upgraded empiric regiment of Vanco and Zosyn -Continues to require vasopressor support -On nasal cannula, desaturation with speech and movement in bed    Acute kidney injury - KDIGO1 -Resolved   -hypokalemia-repleted   -Hypocalcemia- secondary to low albumin from malnutrition   -Protein calorie malnutrition moderate -Albumin 2.6     Septic shock -use vasopressors to keep MAP>65 -With bacteremia due to Streptococcus -continue vancomycin and Zosyn due to worsening leukocytosis  CARDIAC ICU monitoring    GI/Nutrition GI PROPHYLAXIS as indicated DIET-->TF's as tolerated Constipation protocol as indicated  ENDO - ICU hypoglycemic\Hyperglycemia protocol -check FSBS per protocol   DVT/GI PRX ordered TRANSFUSIONS AS NEEDED MONITOR FSBS ASSESS the need for LABS as needed    Critical Care Time devoted to patient care services described in this note is 32 minutes.     Overall, patient is critically ill, prognosis is guarded.  Patient with Multiorgan failure and at high risk for cardiac arrest and death.     Ottie Glazier, M.D.  Pulmonary & Critical Care Medicine

## 2018-05-19 ENCOUNTER — Inpatient Hospital Stay: Payer: Medicare Other

## 2018-05-19 LAB — CBC
HCT: 27.6 % — ABNORMAL LOW (ref 36.0–46.0)
Hemoglobin: 8.7 g/dL — ABNORMAL LOW (ref 12.0–15.0)
MCH: 31.1 pg (ref 26.0–34.0)
MCHC: 31.5 g/dL (ref 30.0–36.0)
MCV: 98.6 fL (ref 80.0–100.0)
Platelets: 211 10*3/uL (ref 150–400)
RBC: 2.8 MIL/uL — ABNORMAL LOW (ref 3.87–5.11)
RDW: 13.7 % (ref 11.5–15.5)
WBC: 16.3 10*3/uL — ABNORMAL HIGH (ref 4.0–10.5)
nRBC: 0 % (ref 0.0–0.2)

## 2018-05-19 LAB — CULTURE, BLOOD (ROUTINE X 2): Special Requests: ADEQUATE

## 2018-05-19 LAB — BASIC METABOLIC PANEL
Anion gap: 4 — ABNORMAL LOW (ref 5–15)
BUN: 7 mg/dL — ABNORMAL LOW (ref 8–23)
CO2: 28 mmol/L (ref 22–32)
Calcium: 7 mg/dL — ABNORMAL LOW (ref 8.9–10.3)
Chloride: 103 mmol/L (ref 98–111)
Creatinine, Ser: 0.54 mg/dL (ref 0.44–1.00)
GFR calc Af Amer: >60 mL/min (ref >60)
GFR calc non Af Amer: >60 mL/min (ref >60)
Glucose, Bld: 96 mg/dL (ref 70–99)
Potassium: 3.8 mmol/L (ref 3.5–5.1)
Sodium: 135 mmol/L (ref 135–145)

## 2018-05-19 MED ORDER — FLUTICASONE PROPIONATE 50 MCG/ACT NA SUSP
1.0000 | Freq: Every day | NASAL | Status: DC
  Administered 2018-05-19 – 2018-05-25 (×6): 1 via NASAL
  Filled 2018-05-19 (×3): qty 16

## 2018-05-19 MED ORDER — METOPROLOL TARTRATE 25 MG PO TABS
12.5000 mg | ORAL_TABLET | Freq: Two times a day (BID) | ORAL | Status: DC
  Administered 2018-05-19 – 2018-05-25 (×12): 12.5 mg via ORAL
  Filled 2018-05-19 (×12): qty 1

## 2018-05-19 NOTE — Progress Notes (Signed)
PT Cancellation Note  Patient Details Name: Linda Mejia MRN: [id number] DOB: 01/15/1956   Cancelled Treatment:    Reason Eval/Treat Not Completed: (Patient wanted to take a nap, requests we return tomorrow.)    [name] 05/19/2018, 2:42 PM

## 2018-05-19 NOTE — Progress Notes (Signed)
Pt is refusing to wear BIPAP and does not want to try Cpap due to the headgear being too much for her head and too tight. Pt does not like the forceful air blowing. Placed pt on heated high flow nasal cannula 80% with a flow of 50L. RN aware. SPO2 92%

## 2018-05-19 NOTE — Progress Notes (Signed)
[facility] [place] NAME: Linda Mejia    MR#:  [id number]  DATE OF BIRTH:  12/01/1955  SUBJECTIVE:  CHIEF COMPLAINT:   Chief Complaint  Patient presents with  . flu symptoms  . Altered Mental Status  hypotensive. Asks - "I'm going to be ok, right?" REVIEW OF SYSTEMS:  Review of Systems  Constitutional: Negative for diaphoresis, fever, malaise/fatigue and weight loss.  HENT: Negative for ear discharge, ear pain, hearing loss, nosebleeds, sore throat and tinnitus.   Eyes: Negative for blurred vision and pain.  Respiratory: Negative for cough, hemoptysis, shortness of breath and wheezing.   Cardiovascular: Negative for chest pain, palpitations, orthopnea and leg swelling.  Gastrointestinal: Negative for abdominal pain, blood in stool, constipation, diarrhea, heartburn, nausea and vomiting.  Genitourinary: Negative for dysuria, frequency and urgency.  Musculoskeletal: Negative for back pain and myalgias.  Skin: Negative for itching and rash.  Neurological: Negative for dizziness, tingling, tremors, focal weakness, seizures, weakness and headaches.  Psychiatric/Behavioral: Negative for depression. The patient is not nervous/anxious.    DRUG ALLERGIES:  No Known Allergies VITALS:  Blood pressure 97/84, pulse 90, temperature 97.8 F (36.6 C), temperature source Oral, resp. rate 18, height 5\' 5"  (1.651 m), weight 98.4 kg, SpO2 92 %. PHYSICAL EXAMINATION:  Physical Exam HENT:     Head: Normocephalic and atraumatic.  Eyes:     Conjunctiva/sclera: Conjunctivae normal.     Pupils: Pupils are equal, round, and reactive to light.  Neck:     Musculoskeletal: Normal range of motion and neck supple.     Thyroid: No thyromegaly.     Trachea: No tracheal deviation.  Cardiovascular:     Rate and Rhythm: Normal rate and regular rhythm.     Heart sounds: Normal heart sounds.  Pulmonary:     Effort: Pulmonary effort is normal. No respiratory  distress.     Breath sounds: Examination of the right-lower field reveals rhonchi. Examination of the left-lower field reveals rhonchi. Decreased breath sounds and rhonchi present. No wheezing.  Chest:     Chest wall: No tenderness.  Abdominal:     General: Bowel sounds are normal. There is no distension.     Palpations: Abdomen is soft.     Tenderness: There is no abdominal tenderness.  Musculoskeletal: Normal range of motion.  Skin:    General: Skin is warm and dry.     Findings: No rash.  Neurological:     Mental Status: She is alert and oriented to person, place, and time.     Cranial Nerves: No cranial nerve deficit.    LABORATORY PANEL:  Female CBC Recent Labs  Lab 05/19/18 0601  WBC 16.3*  HGB 8.7*  HCT 27.6*  PLT 211   ------------------------------------------------------------------------------------------------------------------ Chemistries  Recent Labs  Lab 05/16/18 0853  05/17/18 0539  05/19/18 0601  NA 135   < > 137   < > 135  K 3.3*   < > 4.0   < > 3.8  CL 96*   < > 111   < > 103  CO2 29   < > 21*   < > 28  GLUCOSE 130*   < > 137*   < > 96  BUN 15   < > 20   < > 7*  CREATININE 0.76   < > 0.95   < > 0.54  CALCIUM 8.6*   < > 6.6*   < > 7.0*  MG  --    < >  2.6*  --   --   AST 26  --   --   --   --   ALT 16  --   --   --   --   ALKPHOS 61  --   --   --   --   BILITOT 0.8  --   --   --   --    < > = values in this interval not displayed.   RADIOLOGY:  Dg Chest Port 1 View  Result Date: 05/19/2018 CLINICAL DATA:  Pneumonia.  Sepsis. EXAM: PORTABLE CHEST 1 VIEW COMPARISON:  05/16/2018 FINDINGS: PICC tip is at the cavoatrial junction. There has been marked progression of the consolidative infiltrates in the right midzone and right lung base. There is increased accentuation of the interstitial markings in the left midzone. Heart size and vascularity are within normal limits considering the AP portable technique. Aortic atherosclerosis. No acute bone  abnormality.  Coronary artery stent in place. IMPRESSION: 1. Marked progression of the pneumonia in the right mid and lower lung zones. 2. PICC tip at the cavoatrial junction in good position. 3.  Aortic Atherosclerosis (ICD10-I70.0). 4. Increased interstitial markings in the mid left lung zone which may represent an early infiltrate. Electronically Signed   By: Lorriane Shire M.D.   On: 05/19/2018 15:44   ASSESSMENT AND PLAN:   * Acute respiratory failure with hypoxia due to sepsis and multifocal pneumonia. - on rocephin, stop vanco per PCCM  * Septic shock/Lactic acidosis.  Due to above. Due to pna, mgmt as above - off pressors  Hypokalemia: replete and recheck CAD.  Continue aspirin. Hypertension.  Continue Lopressor but hold Norvasc. ADHD.  Continue home medication. Anemia of chronic dz     All the records are reviewed and case discussed with Care Management/Social Worker. Management plans discussed with the patient, nursing and they are in agreement.  CODE STATUS: Full Code  TOTAL TIME TAKING CARE OF THIS PATIENT: 15 minutes.   More than 50% of the time was spent in counseling/coordination of care: YES  POSSIBLE D/C IN 2-3 DAYS, DEPENDING ON CLINICAL CONDITION.   Max Sane M.D on 05/19/2018 at 4:54 PM  Between 7am to 6pm - Pager - [phone]  After 6pm go to [email or web address] - Proofreader  [facility]  Office  [phone]  CC: Primary care physician; Arnetha Courser, MD  Note: This dictation was prepared with Dragon dictation along with smaller phrase technology. Any transcriptional errors that result from this process are unintentional.

## 2018-05-19 NOTE — Progress Notes (Signed)
CRITICAL CARE NOTE  CC  follow up respiratory failure  SUBJECTIVE Patient remains critically ill Prognosis is guarded     SIGNIFICANT EVENTS    BP (!) 77/30   Pulse 81   Temp 97.8 F (36.6 C) (Oral)   Resp 13   Ht 5\' 5"  (1.651 m)   Wt 98.4 kg   SpO2 94%   BMI 36.10 kg/m    REVIEW OF SYSTEMS  PATIENT IS UNABLE TO PROVIDE COMPLETE REVIEW OF SYSTEMS DUE TO SEVERE CRITICAL ILLNESS   PHYSICAL EXAMINATION:  GENERAL:critically ill appearing, +resp distress HEAD: Normocephalic, atraumatic.  EYES: Pupils equal, round, reactive to light.  No scleral icterus.  MOUTH: Moist mucosal membrane. NECK: Supple. No thyromegaly. No nodules. No JVD.  PULMONARY: +rhonchi, +wheezing CARDIOVASCULAR: S1 and S2. Regular rate and rhythm. No murmurs, rubs, or gallops.  GASTROINTESTINAL: Soft, nontender, -distended. No masses. Positive bowel sounds. No hepatosplenomegaly.  MUSCULOSKELETAL: No swelling, clubbing, or edema.  NEUROLOGIC: obtunded, GCS<8 SKIN:intact,warm,dry      Indwelling Urinary Catheter continued, requirement due to   Reason to continue Indwelling Urinary Catheter for strict Intake/Output monitoring for hemodynamic instability   Central Line continued, requirement due to   Reason to continue [employer] Monitoring of central venous pressure or other hemodynamic parameters   Ventilator continued, requirement due to, resp failure    Ventilator Sedation RASS 0 to -2      ASSESSMENT AND PLAN SYNOPSIS   Severe Hypoxic and Hypercapnic Respiratory Failure Due tocommunity-acquired pneumonia- -CXR with multilobar pneumonia - repeat cxr for interval changes pending -Off vasopressors -On nasal cannula, desaturation with speechandmovement in bed  Sepsis due to bacteremia    - blood ultras positive for strep pneumoniae which is pansensitive, will transition to Rocephin IV daily. -Decreasing beta-blockers due to borderline low BP, off vasopressors -WBC  count trending down from 21-16,000 today    Acute kidney injury- KDIGO1 -Resolved   -hypokalemia-repleted   -Hypocalcemia-secondary to low albumin from malnutrition   -Protein calorie malnutrition moderate -Albumin 2.6    GI/Nutrition GI PROPHYLAXIS as indicated Protonix DIET-->TF's as tolerated Constipation protocol as indicated  ENDO - ICU hypoglycemic\Hyperglycemia protocol -check FSBS per protocol   DVT/GI PRX ordered TRANSFUSIONS AS NEEDED MONITOR FSBS ASSESS the need for LABS as needed    Critical Care Time devoted to patient care services described in this note is32minutes.     Overall, patient is critically ill, prognosis is guarded. Patient with Multiorgan failure and at high risk for cardiac arrest and death.     Ottie Glazier, M.D.  Pulmonary & Critical Care Medicine

## 2018-05-19 NOTE — Progress Notes (Signed)
Pharmacy Antibiotic Note  Linda Mejia is a 63 y.o. female admitted on 05/16/2018 from home with multi lobar pneumonia with signs of sepsis. Pt has a PMH of htn, gastric ulcer disease, CAD, anxiety, ADHD, PVD, and pulmonary HTN.  Pharmacy has been consulted for ceftriaxone dosing.   Plan: 1) Continue ceftriaxone 2 g IV q24h.  2) Discontinue vancomycin per ICU rounds.  Monitor renal function and cbc.    Height: 5\' 5"  (165.1 cm) Weight: 216 lb 14.9 oz (98.4 kg) IBW/kg (Calculated) : 57  Temp (24hrs), Avg:98.6 F (37 C), Min:97.8 F (36.6 C), Max:99.8 F (37.7 C)  Recent Labs  Lab 05/16/18 0852  05/16/18 0853 05/16/18 1127 05/16/18 1316 05/16/18 2016 05/16/18 2247 05/17/18 0539 05/18/18 0454 05/18/18 1758 05/19/18 0601  WBC  --   --  7.4  --   --   --   --  21.7* 21.5*  --  16.3*  CREATININE  --    < > 0.76  --   --  1.10*  --  0.95 0.67 0.62 0.54  LATICACIDVEN 3.2*  --   --  5.3* 4.5* 3.0* 3.1*  --   --   --   --    < > = values in this interval not displayed.    Estimated Creatinine Clearance: 85.8 mL/min (by C-G formula based on SCr of 0.54 mg/dL).    No Known Allergies  Antimicrobials this admission: 2/5 ceftriaxone >>  2/2 ceftriaxone >> 2/3 2/2 Zosyn >> 2/4 2/2 vancomycin >> 2/5 2/2 Cefepime x 1 dose 2/2 Azithromycin x 1 dose  Microbiology results: 2/2 BCx: streptococcus pneumoniae, pansensitive 2/2 UCx: pending 2/2 Sputum: pending 2/2 MRSA PCR: negative Thank you for allowing pharmacy to be a part of this patient's care.  Jacarie Pate Marcellina Millin [facility] 05/19/2018 11:27 AM

## 2018-05-20 ENCOUNTER — Inpatient Hospital Stay: Payer: Medicare Other

## 2018-05-20 LAB — BASIC METABOLIC PANEL
Anion gap: 4 — ABNORMAL LOW (ref 5–15)
BUN: 9 mg/dL (ref 8–23)
CO2: 31 mmol/L (ref 22–32)
Calcium: 7.6 mg/dL — ABNORMAL LOW (ref 8.9–10.3)
Chloride: 102 mmol/L (ref 98–111)
Creatinine, Ser: 0.46 mg/dL (ref 0.44–1.00)
GFR calc Af Amer: >60 mL/min (ref >60)
GFR calc non Af Amer: >60 mL/min (ref >60)
Glucose, Bld: 116 mg/dL — ABNORMAL HIGH (ref 70–99)
Potassium: 3.9 mmol/L (ref 3.5–5.1)
Sodium: 137 mmol/L (ref 135–145)

## 2018-05-20 LAB — CBC
HCT: 30.2 % — ABNORMAL LOW (ref 36.0–46.0)
Hemoglobin: 9.3 g/dL — ABNORMAL LOW (ref 12.0–15.0)
MCH: 30.6 pg (ref 26.0–34.0)
MCHC: 30.8 g/dL (ref 30.0–36.0)
MCV: 99.3 fL (ref 80.0–100.0)
Platelets: 243 10*3/uL (ref 150–400)
RBC: 3.04 MIL/uL — ABNORMAL LOW (ref 3.87–5.11)
RDW: 13.5 % (ref 11.5–15.5)
WBC: 11.4 10*3/uL — ABNORMAL HIGH (ref 4.0–10.5)
nRBC: 0.2 % (ref 0.0–0.2)

## 2018-05-20 LAB — HEPATIC FUNCTION PANEL
ALT: 13 U/L (ref 0–44)
AST: 17 U/L (ref 15–41)
Albumin: 2.4 g/dL — ABNORMAL LOW (ref 3.5–5.0)
Alkaline Phosphatase: 96 U/L (ref 38–126)
Bilirubin, Direct: <0.1 mg/dL (ref 0.0–0.2)
Total Bilirubin: 0.4 mg/dL (ref 0.3–1.2)
Total Protein: 5.4 g/dL — ABNORMAL LOW (ref 6.5–8.1)

## 2018-05-20 MED ORDER — ENSURE ENLIVE PO LIQD
237.0000 mL | Freq: Two times a day (BID) | ORAL | Status: DC
  Administered 2018-05-20 – 2018-05-24 (×7): 237 mL via ORAL

## 2018-05-20 NOTE — Evaluation (Signed)
Physical Therapy Evaluation Patient Details Name: Linda Mejia MRN: [id number] DOB: 1955/05/07 Today's Date: 05/20/2018   History of Present Illness  Pt is a 63 y.o. female with a known history of ADHD, aortic atherosclerosis, hypertension, pulmonary hypertension, CAD, chronic back pain, UTI and prediabetes.  The patient was sent to ED with complaints of shortness of breath and a cough with sputum.  She also complained of body aching, fever, chills, nausea and vomiting.  She was found septic with tachycardia, tachypnea and a lactic acidosis.  Chest x-ray showed right side multiple focal pneumonia.  Assessment includes: Acute respiratory failure with hypoxia due to sepsis and multifocal pneumonia, septic shock/lactic acidosis, hypokalemia, CAD, HTN, and anemia of chronic disease.     Clinical Impression  Pt presents with deficits in strength, transfers, mobility, gait, balance, and activity tolerance.  Pt required min A and extra time for bed mobility tasks.  Pt was able to stand from an elevated EOB with CGA and then was able to take 2-3 very small steps at the EOB before fatiguing and requiring to return to sitting.  Pt's SpO2 dropped to 86-87% after amb but increased back to low 90s quickly with PLB. Overall pt is very weak with poor activity tolerance and would not be safe to return to her prior living situation at this time.  Pt will benefit from PT services in a SNF setting upon discharge to safely address above deficits for decreased caregiver assistance and eventual return to PLOF.      Follow Up Recommendations SNF    Equipment Recommendations  Other (comment)(TBD at next venue of care; pt owns no AD)    Recommendations for Other Services       Precautions / Restrictions Precautions Precautions: Fall Restrictions Weight Bearing Restrictions: No      Mobility  Bed Mobility Overal bed mobility: Needs Assistance Bed Mobility: Supine to Sit;Sit to Supine     Supine to sit: Min  assist Sit to supine: Min assist   General bed mobility comments: Min A for BLE and trunk control  Transfers Overall transfer level: Needs assistance Equipment used: Rolling walker (2 wheeled) Transfers: Sit to/from Stand Sit to Stand: Min guard;From elevated surface         General transfer comment: Min verbal cues for hand placement  Ambulation/Gait Ambulation/Gait assistance: Min guard Gait Distance (Feet): 2 Feet Assistive device: Rolling walker (2 wheeled) Gait Pattern/deviations: Step-to pattern;Decreased step length - right;Decreased step length - left Gait velocity: decreased   General Gait Details: SpO2 dropped to 86-87% after taking several short steps at the EOB with pt requiring to return to sitting secondary to fatigue.  SpO2 increased back to low 90s quickly with PLB.   Stairs            Wheelchair Mobility    Modified Rankin (Stroke Patients Only)       Balance Overall balance assessment: Mild deficits observed, not formally tested                                           Pertinent Vitals/Pain Pain Assessment: 0-10 Pain Score: 7  Pain Location: chest Pain Descriptors / Indicators: Sore;Aching Pain Intervention(s): Monitored during session    Home Living Family/patient expects to be discharged to:: Private residence Living Arrangements: Alone Available Help at Discharge: Family;Available PRN/intermittently(Daughter can stay the night but pt alone during the  day) Type of Home: Apartment Home Access: Level entry     Home Layout: One level Home Equipment: Grab bars - tub/shower      Prior Function Level of Independence: Independent         Comments: Ind amb without AD community distances, Ind with ADLs, no fall history     Hand Dominance        Extremity/Trunk Assessment   Upper Extremity Assessment Upper Extremity Assessment: Generalized weakness    Lower Extremity Assessment Lower Extremity Assessment:  Generalized weakness       Communication   Communication: No difficulties  Cognition Arousal/Alertness: Awake/alert Behavior During Therapy: WFL for tasks assessed/performed Overall Cognitive Status: Within Functional Limits for tasks assessed                                        General Comments      Exercises Total Joint Exercises Ankle Circles/Pumps: AROM;Strengthening;Both;10 reps Quad Sets: Strengthening;Both;10 reps Gluteal Sets: Strengthening;Both;10 reps Hip ABduction/ADduction: AAROM;Both;10 reps Straight Leg Raises: AAROM;Both;10 reps Long Arc Quad: AROM;Both;15 reps Knee Flexion: AROM;Both;15 reps Other Exercises Other Exercises: HEP education and review for BLE APs, QS, and GS x 10 each every 1-2 hours daily   Assessment/Plan    PT Assessment Patient needs continued PT services  PT Problem List Decreased strength;Decreased activity tolerance;Decreased balance;Decreased mobility;Decreased knowledge of use of DME       PT Treatment Interventions DME instruction;Gait training;Functional mobility training;Therapeutic activities;Therapeutic exercise;Balance training;Patient/family education    PT Goals (Current goals can be found in the Care Plan section)  Acute Rehab PT Goals Patient Stated Goal: To get stronger and walk again PT Goal Formulation: With patient Time For Goal Achievement: 06/02/18 Potential to Achieve Goals: Good    Frequency Min 2X/week   Barriers to discharge Inaccessible home environment;Decreased caregiver support Pt alone during the day with no assistance available, daughter can only assist pt at night    Co-evaluation               AM-PAC PT "6 Clicks" Mobility  Outcome Measure Help needed turning from your back to your side while in a flat bed without using bedrails?: A Little Help needed moving from lying on your back to sitting on the side of a flat bed without using bedrails?: A Little Help needed moving  to and from a bed to a chair (including a wheelchair)?: A Little Help needed standing up from a chair using your arms (e.g., wheelchair or bedside chair)?: A Little Help needed to walk in hospital room?: A Lot Help needed climbing 3-5 steps with a railing? : Total 6 Click Score: 15    End of Session Equipment Utilized During Treatment: Gait belt;Oxygen Activity Tolerance: Patient limited by fatigue Patient left: in bed;with call bell/phone within reach Nurse Communication: Mobility status PT Visit Diagnosis: Muscle weakness (generalized) (M62.81);Difficulty in walking, not elsewhere classified (R26.2);Unsteadiness on feet (R26.81)    Time: 1224-4975 PT Time Calculation (min) (ACUTE ONLY): 25 min   Charges:   PT Evaluation $PT Eval Moderate Complexity: 1 Mod PT Treatments $Therapeutic Exercise: 8-22 mins        D. Scott Brylon Brenning PT, DPT 05/20/18, 12:03 PM

## 2018-05-20 NOTE — Progress Notes (Signed)
CRITICAL CARE NOTE  CC  follow up respiratory failure  SUBJECTIVE Patient remains critically ill Prognosis is guarded Resting in bed comfortably no new complaints overnight events noted    SIGNIFICANT EVENTS -Pulmonary status improved transition to nasal cannula 8 L/min    Vitals:   05/20/18 1000 05/20/18 1100  BP: 117/68 128/83  Pulse: (!) 107 93  Resp: 14 17  Temp:    SpO2: 94% 94%     REVIEW OF SYSTEMS  10 system ROS is conducted and negative except as per subjective findings and overnight events  PHYSICAL EXAMINATION:  GENERAL:critically ill appearing, +resp distress HEAD: Normocephalic, atraumatic.  EYES: Pupils equal, round, reactive to light.  No scleral icterus.  MOUTH: Moist mucosal membrane. NECK: Supple. No thyromegaly. No nodules. No JVD.  PULMONARY: +rhonchi at bases CARDIOVASCULAR: S1 and S2. Regular rate and rhythm. No murmurs, rubs, or gallops.  GASTROINTESTINAL: Soft, nontender, -distended. No masses. Positive bowel sounds. No hepatosplenomegaly.  MUSCULOSKELETAL: No swelling, clubbing, or edema.  NEUROLOGIC: Mild distress due to acute illness SKIN:intact,warm,dry   Labs and Imaging:     -I personally reviewed most recent blood work, imaging and microbiology - significant findings today are improved leukocytosis improved anemia, low albumin at 2.4 today  LAB RESULTS: Recent Labs  Lab 05/18/18 1758 05/19/18 0601 05/20/18 0505  NA 136 135 137  K 3.6 3.8 3.9  CL 105 103 102  CO2 27 28 31   BUN 10 7* 9  CREATININE 0.62 0.54 0.46  GLUCOSE 139* 96 116*   Recent Labs  Lab 05/18/18 0454 05/19/18 0601 05/20/18 0505  HGB 9.0* 8.7* 9.3*  HCT 28.8* 27.6* 30.2*  WBC 21.5* 16.3* 11.4*  PLT 238 211 243     IMAGING RESULTS: Dg Chest Port 1 View  Result Date: 05/20/2018 CLINICAL DATA:  Acute respiratory failure EXAM: PORTABLE CHEST 1 VIEW COMPARISON:  05/19/2018 FINDINGS: Cardiac enlargement. Right PICC line with tip over the cavoatrial  junction region. No pneumothorax. Airspace consolidation in the right lung and left lung base, similar to previous study. No blunting of costophrenic angles. IMPRESSION: Bilateral pulmonary infiltrates, similar to previous study. Electronically Signed   By: Lucienne Capers M.D.   On: 05/20/2018 03:39   Dg Chest Port 1 View  Result Date: 05/19/2018 CLINICAL DATA:  Pneumonia.  Sepsis. EXAM: PORTABLE CHEST 1 VIEW COMPARISON:  05/16/2018 FINDINGS: PICC tip is at the cavoatrial junction. There has been marked progression of the consolidative infiltrates in the right midzone and right lung base. There is increased accentuation of the interstitial markings in the left midzone. Heart size and vascularity are within normal limits considering the AP portable technique. Aortic atherosclerosis. No acute bone abnormality.  Coronary artery stent in place. IMPRESSION: 1. Marked progression of the pneumonia in the right mid and lower lung zones. 2. PICC tip at the cavoatrial junction in good position. 3.  Aortic Atherosclerosis (ICD10-I70.0). 4. Increased interstitial markings in the mid left lung zone which may represent an early infiltrate. Electronically Signed   By: Lorriane Shire M.D.   On: 05/19/2018 15:44     ASSESSMENT AND PLAN SYNOPSIS  -Multidisciplinary rounds held today   Severe Hypoxic  Respiratory Failure -Chest x-ray from today -no interval changes -Transition to nasal cannula 8 L/min Due tocommunity-acquired pneumonia- -CXR with multilobar pneumonia-Off vasopressors   Sepsis due to bacteremia    - blood ultras positive for strep pneumoniae-continue Rocephin  -Decreasing beta-blockers due to borderline low BP, off vasopressors -WBC count trending down  Acute kidney injury- KDIGO1 -Resolved   -hypokalemia-repleted   -Hypocalcemia-secondary to low albumin from malnutrition   -Protein calorie malnutrition moderate -Albumin 2.4   Critical care provider statement:     Critical care time (minutes):  34   Critical care time was exclusive of:  Separately billable procedures and  treating other patients   Critical care was necessary to treat or prevent imminent or  life-threatening deterioration of the following conditions:   Sepsis due to strep pneumo bacteremia, bilateral multi lobar pneumonia likely due to strep pneumo   Critical care was time spent personally by me on the following  activities:  Development of treatment plan with patient or surrogate,  discussions with consultants, evaluation of patient's response to  treatment, examination of patient, obtaining history from patient or  surrogate, ordering and performing treatments and interventions, ordering  and review of laboratory studies and re-evaluation of patient's condition   I assumed direction of critical care for this patient from another  provider in my specialty: no     Ottie Glazier, M.D.  Pulmonary & [place]

## 2018-05-20 NOTE — Progress Notes (Signed)
[facility] [place] NAME: Linda Mejia    MR#:  [id number]  DATE OF BIRTH:  Sep 21, 1955  SUBJECTIVE:  CHIEF COMPLAINT:   Chief Complaint  Patient presents with  . flu symptoms  . Altered Mental Status  tired. BP improving REVIEW OF SYSTEMS:  Review of Systems  Constitutional: Negative for diaphoresis, fever, malaise/fatigue and weight loss.  HENT: Negative for ear discharge, ear pain, hearing loss, nosebleeds, sore throat and tinnitus.   Eyes: Negative for blurred vision and pain.  Respiratory: Negative for cough, hemoptysis, shortness of breath and wheezing.   Cardiovascular: Negative for chest pain, palpitations, orthopnea and leg swelling.  Gastrointestinal: Negative for abdominal pain, blood in stool, constipation, diarrhea, heartburn, nausea and vomiting.  Genitourinary: Negative for dysuria, frequency and urgency.  Musculoskeletal: Negative for back pain and myalgias.  Skin: Negative for itching and rash.  Neurological: Negative for dizziness, tingling, tremors, focal weakness, seizures, weakness and headaches.  Psychiatric/Behavioral: Negative for depression. The patient is not nervous/anxious.    DRUG ALLERGIES:  No Known Allergies VITALS:  Blood pressure 118/86, pulse (!) 106, temperature 98.7 F (37.1 C), temperature source Oral, resp. rate 15, height 5\' 5"  (1.651 m), weight 98.4 kg, SpO2 95 %. PHYSICAL EXAMINATION:  Physical Exam HENT:     Head: Normocephalic and atraumatic.  Eyes:     Conjunctiva/sclera: Conjunctivae normal.     Pupils: Pupils are equal, round, and reactive to light.  Neck:     Musculoskeletal: Normal range of motion and neck supple.     Thyroid: No thyromegaly.     Trachea: No tracheal deviation.  Cardiovascular:     Rate and Rhythm: Normal rate and regular rhythm.     Heart sounds: Normal heart sounds.  Pulmonary:     Effort: Pulmonary effort is normal. No respiratory distress.     Breath sounds:  Examination of the right-lower field reveals rhonchi. Examination of the left-lower field reveals rhonchi. Decreased breath sounds and rhonchi present. No wheezing.  Chest:     Chest wall: No tenderness.  Abdominal:     General: Bowel sounds are normal. There is no distension.     Palpations: Abdomen is soft.     Tenderness: There is no abdominal tenderness.  Musculoskeletal: Normal range of motion.  Skin:    General: Skin is warm and dry.     Findings: No rash.  Neurological:     Mental Status: She is alert and oriented to person, place, and time.     Cranial Nerves: No cranial nerve deficit.    LABORATORY PANEL:  Female CBC Recent Labs  Lab 05/20/18 0505  WBC 11.4*  HGB 9.3*  HCT 30.2*  PLT 243   ------------------------------------------------------------------------------------------------------------------ Chemistries  Recent Labs  Lab 05/17/18 0539  05/20/18 0505  NA 137   < > 137  K 4.0   < > 3.9  CL 111   < > 102  CO2 21*   < > 31  GLUCOSE 137*   < > 116*  BUN 20   < > 9  CREATININE 0.95   < > 0.46  CALCIUM 6.6*   < > 7.6*  MG 2.6*  --   --   AST  --   --  17  ALT  --   --  13  ALKPHOS  --   --  96  BILITOT  --   --  0.4   < > = values in this  interval not displayed.   RADIOLOGY:  Dg Chest Port 1 View  Result Date: 05/20/2018 CLINICAL DATA:  Acute respiratory failure EXAM: PORTABLE CHEST 1 VIEW COMPARISON:  05/19/2018 FINDINGS: Cardiac enlargement. Right PICC line with tip over the cavoatrial junction region. No pneumothorax. Airspace consolidation in the right lung and left lung base, similar to previous study. No blunting of costophrenic angles. IMPRESSION: Bilateral pulmonary infiltrates, similar to previous study. Electronically Signed   By: Lucienne Capers M.D.   On: 05/20/2018 03:39   ASSESSMENT AND PLAN:   * Acute respiratory failure with hypoxia due to sepsis and multifocal pneumonia. - on rocephin, stop vanco per PCCM. Also on acyclovir per  pCCM  * Septic shock/Lactic acidosis.  Due to above. Due to pna, mgmt as above - off pressors  Hypokalemia: replete and recheck  CAD.  Continue aspirin. Hypertension.  Continue Lopressor but hold Norvasc. ADHD.  Continue home medication. Anemia of chronic dz     All the records are reviewed and case discussed with Care Management/Social Worker. Management plans discussed with the patient, nursing and they are in agreement.  CODE STATUS: Full Code  TOTAL TIME TAKING CARE OF THIS PATIENT: 15 minutes.   More than 50% of the time was spent in counseling/coordination of care: YES  POSSIBLE D/C IN 2-3 DAYS, DEPENDING ON CLINICAL CONDITION.   Max Sane M.D on 05/20/2018 at 4:42 PM  Between 7am to 6pm - Pager - [phone]  After 6pm go to [email or web address] - Proofreader  [facility]  Office  [phone]  CC: Primary care physician; Arnetha Courser, MD  Note: This dictation was prepared with Dragon dictation along with smaller phrase technology. Any transcriptional errors that result from this process are unintentional.

## 2018-05-21 LAB — CULTURE, RESPIRATORY W GRAM STAIN: Culture: NORMAL

## 2018-05-21 LAB — CULTURE, BLOOD (ROUTINE X 2)
Culture: NO GROWTH
Special Requests: ADEQUATE

## 2018-05-21 MED ORDER — AMPHETAMINE-DEXTROAMPHETAMINE 5 MG PO TABS
30.0000 mg | ORAL_TABLET | Freq: Two times a day (BID) | ORAL | Status: DC
  Administered 2018-05-21 – 2018-05-25 (×8): 30 mg via ORAL
  Filled 2018-05-21 (×8): qty 6

## 2018-05-21 MED ORDER — AMOXICILLIN 250 MG/5ML PO SUSR
875.0000 mg | Freq: Two times a day (BID) | ORAL | Status: DC
  Administered 2018-05-21: 21:00:00 500 mg via ORAL
  Administered 2018-05-21 – 2018-05-24 (×6): 875 mg via ORAL
  Filled 2018-05-21 (×9): qty 20

## 2018-05-21 NOTE — Clinical Social Work Placement (Signed)
   CLINICAL SOCIAL WORK PLACEMENT  NOTE  Date:  05/21/2018  Patient Details  Name: Linda Mejia MRN: [id number] Date of Birth: 02-May-1955  Clinical Social Work is seeking post-discharge placement for this patient at the [place] level of care (*CSW will initial, date and re-position this form in  chart as items are completed):  Yes   Patient/family provided with [place] Work Department's list of facilities offering this level of care within the geographic area requested by the patient (or if unable, by the patient's family).  Yes   Patient/family informed of their freedom to choose among providers that offer the needed level of care, that participate in Medicare, Medicaid or managed care program needed by the patient, have an available bed and are willing to accept the patient.  Yes   Patient/family informed of [facility]'s ownership interest in [facility] and [facility], as well as of the fact that they are under no obligation to receive care at these facilities.  PASRR submitted to EDS on 05/21/18     PASRR number received on 05/21/18     Existing PASRR number confirmed on       FL2 transmitted to all facilities in geographic area requested by pt/family on 05/21/18     FL2 transmitted to all facilities within larger geographic area on       Patient informed that his/her managed care company has contracts with or will negotiate with certain facilities, including the following:            Patient/family informed of bed offers received.  Patient chooses bed at       Physician recommends and patient chooses bed at      Patient to be transferred to   on  .  Patient to be transferred to facility by       Patient family notified on   of transfer.  Name of family member notified:        PHYSICIAN       Additional Comment:    _______________________________________________ Annamaria Boots, [place] 05/21/2018, 9:55 AM

## 2018-05-21 NOTE — Care Management Important Message (Signed)
Important Message  Patient Details  Name: Linda Mejia MRN: [id number] Date of Birth: April 23, 1955   Medicare Important Message Given:  Yes    Juliann Pulse A Yasira Engelson 05/21/2018, 11:30 AM

## 2018-05-21 NOTE — Progress Notes (Signed)
[place] [facility] [place] NAME: Idalys Konecny    MR#:  [id number]  DATE OF BIRTH:  29-Sep-1955  SUBJECTIVE:  CHIEF COMPLAINT:   Chief Complaint  Patient presents with  . flu symptoms  . Altered Mental Status   Complaints of feeling not well   REVIEW OF SYSTEMS:  Review of Systems  Constitutional: Negative for diaphoresis, fever, malaise/fatigue and weight loss.  HENT: Negative for ear discharge, ear pain, hearing loss, nosebleeds, sore throat and tinnitus.   Eyes: Negative for blurred vision and pain.  Respiratory: Negative for cough, hemoptysis, shortness of breath and wheezing.   Cardiovascular: Negative for chest pain, palpitations, orthopnea and leg swelling.  Gastrointestinal: Negative for abdominal pain, blood in stool, constipation, diarrhea, heartburn, nausea and vomiting.  Genitourinary: Negative for dysuria, frequency and urgency.  Musculoskeletal: Negative for back pain and myalgias.  Skin: Negative for itching and rash.  Neurological: Negative for dizziness, tingling, tremors, focal weakness, seizures, weakness and headaches.  Psychiatric/Behavioral: Negative for depression. The patient is not nervous/anxious.    DRUG ALLERGIES:  No Known Allergies VITALS:  Blood pressure 121/70, pulse (!) 108, temperature 98 F (36.7 C), resp. rate 18, height 5\' 5"  (1.651 m), weight 98.4 kg, SpO2 94 %. PHYSICAL EXAMINATION:  Physical Exam HENT:     Head: Normocephalic and atraumatic.  Eyes:     Conjunctiva/sclera: Conjunctivae normal.     Pupils: Pupils are equal, round, and reactive to light.  Neck:     Musculoskeletal: Normal range of motion and neck supple.     Thyroid: No thyromegaly.     Trachea: No tracheal deviation.  Cardiovascular:     Rate and Rhythm: Normal rate and regular rhythm.     Heart sounds: Normal heart sounds.  Pulmonary:     Effort: Pulmonary effort is normal. No respiratory distress.     Breath sounds: Examination  of the right-lower field reveals rhonchi. Examination of the left-lower field reveals rhonchi. Decreased breath sounds and rhonchi present. No wheezing.  Chest:     Chest wall: No tenderness.  Abdominal:     General: Bowel sounds are normal. There is no distension.     Palpations: Abdomen is soft.     Tenderness: There is no abdominal tenderness.  Musculoskeletal: Normal range of motion.  Skin:    General: Skin is warm and dry.     Findings: No rash.  Neurological:     Mental Status: She is alert and oriented to person, place, and time.     Cranial Nerves: No cranial nerve deficit.    LABORATORY PANEL:  Female CBC Recent Labs  Lab 05/20/18 0505  WBC 11.4*  HGB 9.3*  HCT 30.2*  PLT 243   ------------------------------------------------------------------------------------------------------------------ Chemistries  Recent Labs  Lab 05/17/18 0539  05/20/18 0505  NA 137   < > 137  K 4.0   < > 3.9  CL 111   < > 102  CO2 21*   < > 31  GLUCOSE 137*   < > 116*  BUN 20   < > 9  CREATININE 0.95   < > 0.46  CALCIUM 6.6*   < > 7.6*  MG 2.6*  --   --   AST  --   --  17  ALT  --   --  13  ALKPHOS  --   --  96  BILITOT  --   --  0.4   < > = values  in this interval not displayed.   RADIOLOGY:  No results found. ASSESSMENT AND PLAN:   * Acute respiratory failure with hypoxia due to sepsis and multifocal pneumonia. -Continue Rocephin -Follow-up procalcitonin level  * Septic shock/Lactic acidosis.  Strep pneumonia in the blood, continue IV Rocephin  *Hypokalemia: Replaced  CAD.  Continue aspirin.  Hypertension.  Continue Lopressor but hold Norvasc.  ADHD.  Continue home medication.  Anemia of chronic dz     All the records are reviewed and case discussed with Care Management/Social Worker. Management plans discussed with the patient, nursing and they are in agreement.  CODE STATUS: Full Code  TOTAL TIME TAKING CARE OF THIS PATIENT: 47min minutes.   More than  50% of the time was spent in counseling/coordination of care: YES  POSSIBLE D/C IN 2-3 DAYS, DEPENDING ON CLINICAL CONDITION.   Dustin Flock M.D on 05/21/2018 at 1:16 PM  Between 7am to 6pm - Pager - [phone]  After 6pm go to [email or web address] - Proofreader  [facility]  Office  [phone]  CC: Primary care physician; Arnetha Courser, MD  Note: This dictation was prepared with Dragon dictation along with smaller phrase technology. Any transcriptional errors that result from this process are unintentional.

## 2018-05-21 NOTE — Clinical Social Work Note (Signed)
Clinical Social Work Assessment  Patient Details  Name: Linda Mejia MRN: [id number] Date of Birth: December 01, 1955  Date of referral:  05/21/18               Reason for consult:  Facility Placement                Permission sought to share information with:  Case Manager, Customer service manager, Family Supports Permission granted to share information::  Yes, Verbal Permission Granted  Name::      SNF  Agency::   [facility]   Relationship::     Contact Information:     Housing/Transportation Living arrangements for the past 2 months:  [place] of Information:  Patient Patient Interpreter Needed:  None Criminal Activity/Legal Involvement Pertinent to Current Situation/Hospitalization:  No - Comment as needed Significant Relationships:  Adult Children Lives with:  Self Do you feel safe going back to the place where you live?  Yes Need for family participation in patient care:  No (Coment)  Care giving concerns:  Patient lives alone    Facilities manager / plan:  CSW consulted for SNF placement. CSW met with patient to discuss discharge plan. Patient reports that she lives alone and is independent normally but she feels very weak since coming to the hospital. CSW explained PT recommendation of SNF. Patient is agreeable to SNF and would like a bed search done. CSW will initiate bed search and give offers once received. CSW will follow for discharge planning.   Employment status:  Retired Nurse, adult PT Recommendations:  [place] / Referral to community resources:  [place]  Patient/Family's Response to care:  Patient thanked CSW for assistance   Patient/Family's Understanding of and Emotional Response to Diagnosis, Current Treatment, and Prognosis:  Patient is very lethargic but otherwise appropriate. Patient states understanding of current diagnoses and treatment   Emotional  Assessment Appearance:  Appears stated age Attitude/Demeanor/Rapport:  Lethargic Affect (typically observed):  Accepting Orientation:  Oriented to Self, Oriented to Place, Oriented to  Time, Oriented to Situation Alcohol / Substance use:  Not Applicable Psych involvement (Current and /or in the community):  No (Comment)  Discharge Needs  Concerns to be addressed:  Discharge Planning Concerns Readmission within the last 30 days:  No Current discharge risk:  Dependent with Mobility Barriers to Discharge:  Continued Medical Work up   [employer], [place] 05/21/2018, 9:51 AM

## 2018-05-21 NOTE — Clinical Social Work Note (Signed)
CSW met with patient to give bed offers. Before offer could be presented patient stated that she feels that she would prefer to go home. Patient reports that she has help from her daughter and feels that she will be strong enough to return home. Patient refusing SNF at this time but is accepting of home health. CSW notified RNCM of change. CSW signing off. Please re consult if further needs arise.   [place], [place]

## 2018-05-21 NOTE — NC FL2 (Signed)
[place] LEVEL OF CARE SCREENING TOOL     IDENTIFICATION  Patient Name: Linda Mejia Birthdate: 1955/05/20 Sex: female Admission Date (Current Location): 05/16/2018  [place] and [place] Number:  Engineering geologist and Address:  [facility], [address], [place], [place] [address]      Provider Number: [id number]  Attending Physician Name and Address:  Dustin Flock, MD  Relative Name and Phone Number:       Current Level of Care: Hospital Recommended Level of Care: [place] Prior Approval Number:    Date Approved/Denied:   PASRR Number: [id number]  Discharge Plan: SNF    Current Diagnoses: Patient Active Problem List   Diagnosis Date Noted  . Sepsis ([place]) 05/16/2018  . Acute peptic ulcer of stomach   . Stomach irritation   . Abdominal pain, epigastric   . Acute gastric ulcer due to Helicobacter pylori   . Pulmonary hypertension ([place]) 03/09/2018  . Unstable angina ([place]) 01/23/2018  . Medication monitoring encounter 03/18/2017  . Colon cancer screening 03/18/2017  . Coronary artery calcification seen on CT scan 12/02/2016  . Aortic atherosclerosis ([place]) 12/02/2016  . Abnormal ankle brachial index (ABI) 12/02/2016  . Prediabetes 12/02/2016  . Fracture of rib 08/28/2016  . Chest pain 08/11/2016  . Hx of tobacco use, presenting hazards to health 06/09/2016  . Degenerative arthritis of knee, bilateral 12/27/2015  . Obesity 12/27/2015  . GI bleed 09/21/2015  . Reflux esophagitis   . Other diseases of stomach and duodenum   . Multiple joint pain 06/05/2015  . Night sweats 06/05/2015  . Headache 04/12/2015  . Subclinical hypothyroidism 03/26/2015  . Herpes simplex type 2 infection 03/21/2015  . Rhinitis, allergic 03/21/2015  . Cervical pain 09/21/2014  . ADHD (attention deficit hyperactivity disorder), inattentive type 09/20/2014  . Episodic paroxysmal anxiety disorder 09/20/2014  . History of gastric  ulcer 09/20/2014  . Abuse, drug or alcohol ([place]) 09/20/2014  . History of alcohol use disorder 09/20/2014  . HLD (hyperlipidemia) 10/03/2009  . Coronary artery disease involving native coronary artery of native heart with angina pectoris ([place]) 10/02/2009  . Hypertension goal BP (blood pressure) < 140/90 10/02/2009    Orientation RESPIRATION BLADDER Height & Weight     Self, Time, Place, Situation  O2(6 liters ) Indwelling catheter Weight: 216 lb 14.9 oz (98.4 kg) Height:  5\' 5"  (165.1 cm)  BEHAVIORAL SYMPTOMS/MOOD NEUROLOGICAL BOWEL NUTRITION STATUS  (none) (none) Continent Diet(Heart Healthy )  AMBULATORY STATUS COMMUNICATION OF NEEDS Skin   Extensive Assist Verbally Normal                       Personal Care Assistance Level of Assistance  Bathing, Feeding, Dressing Bathing Assistance: Limited assistance Feeding assistance: Independent Dressing Assistance: Limited assistance     Functional Limitations Info  Sight, Hearing, Speech Sight Info: Adequate Hearing Info: Adequate Speech Info: Adequate    SPECIAL CARE FACTORS FREQUENCY  PT (By licensed PT), OT (By licensed OT)     PT Frequency: 5 OT Frequency: 5            Contractures Contractures Info: Not present    Additional Factors Info  Code Status, Allergies Code Status Info: Full Code  Allergies Info: NKA           Current Medications (05/21/2018):  This is the current hospital active medication list Current Facility-Administered Medications  Medication Dose Route Frequency Provider Last Rate Last Dose  .  acetaminophen (TYLENOL) tablet 650 mg  650 mg Oral Q6H PRN [name], MD       Or  . acetaminophen (TYLENOL) suppository 650 mg  650 mg Rectal Q6H PRN [name], MD      . acyclovir (ZOVIRAX) 200 MG capsule 400 mg  400 mg Oral BID [name], MD   400 mg at 05/20/18 2141  . albuterol (PROVENTIL) (2.5 MG/3ML) 0.083% nebulizer solution 2.5 mg  2.5 mg Nebulization Q2H PRN Conforti, John, DO   2.5 mg  at 05/18/18 0306  . aspirin EC tablet 81 mg  81 mg Oral Daily [name], MD   81 mg at 05/20/18 0940  . bisacodyl (DULCOLAX) EC tablet 5 mg  5 mg Oral Daily PRN [name], MD      . cefTRIAXone (ROCEPHIN) 2 g in sodium chloride 0.9 % 100 mL IVPB  2 g Intravenous q1800 Ottie Glazier, MD   Stopped at 05/20/18 1828  . enoxaparin (LOVENOX) injection 40 mg  40 mg Subcutaneous Q24H [name], MD   40 mg at 05/20/18 2141  . feeding supplement (ENSURE ENLIVE) (ENSURE ENLIVE) liquid 237 mL  237 mL Oral BID BM Aleskerov, Fuad, MD   237 mL at 05/20/18 1544  . fluticasone (FLONASE) 50 MCG/ACT nasal spray 1 spray  1 spray Each Nare Daily Charlett Nose, RPH   1 spray at 05/20/18 0941  . guaiFENesin-dextromethorphan (ROBITUSSIN DM) 100-10 MG/5ML syrup 5 mL  5 mL Oral Q4H PRN [name], MD      . HYDROcodone-acetaminophen (NORCO/VICODIN) 5-325 MG per tablet 1-2 tablet  1-2 tablet Oral Q4H PRN [name], MD   1 tablet at 05/21/18 0242  . ipratropium-albuterol (DUONEB) 0.5-2.5 (3) MG/3ML nebulizer solution 3 mL  3 mL Nebulization TID Conforti, John, DO   3 mL at 05/21/18 0808  . lactated ringers infusion   Intravenous Continuous Ottie Glazier, MD 75 mL/hr at 05/21/18 0415    . metoprolol tartrate (LOPRESSOR) tablet 12.5 mg  12.5 mg Oral BID Ottie Glazier, MD   12.5 mg at 05/20/18 2141  . nitroGLYCERIN (NITROSTAT) SL tablet 0.4 mg  0.4 mg Sublingual Q5 min PRN [name], MD      . ondansetron [facility]) tablet 4 mg  4 mg Oral Q6H PRN [name], MD       Or  . ondansetron [facility]) injection 4 mg  4 mg Intravenous Q6H PRN [name], MD      . pantoprazole (PROTONIX) EC tablet 40 mg  40 mg Oral BID [name], MD   40 mg at 05/20/18 1751  . senna-docusate (Senokot-S) tablet 1 tablet  1 tablet Oral QHS PRN [name], MD         Discharge Medications: Please see discharge summary for a list of discharge medications.  Relevant Imaging Results:  Relevant Lab Results:   Additional  Information SSN: [id number]  Annamaria Boots, [place]

## 2018-05-22 LAB — CULTURE, BLOOD (ROUTINE X 2)
Culture: NO GROWTH
Culture: NO GROWTH

## 2018-05-22 MED ORDER — DIPHENHYDRAMINE HCL 25 MG PO CAPS
25.0000 mg | ORAL_CAPSULE | Freq: Four times a day (QID) | ORAL | Status: DC | PRN
  Administered 2018-05-22 – 2018-05-24 (×4): 25 mg via ORAL
  Filled 2018-05-22 (×4): qty 1

## 2018-05-22 MED ORDER — SODIUM CHLORIDE 0.9% FLUSH
3.0000 mL | Freq: Two times a day (BID) | INTRAVENOUS | Status: DC
  Administered 2018-05-22 – 2018-05-24 (×6): 3 mL via INTRAVENOUS

## 2018-05-22 MED ORDER — SODIUM CHLORIDE 0.9% FLUSH
10.0000 mL | INTRAVENOUS | Status: DC | PRN
  Administered 2018-05-24: 11:00:00 10 mL via INTRAVENOUS
  Filled 2018-05-22: qty 10

## 2018-05-22 MED ORDER — SODIUM CHLORIDE 0.9% FLUSH
10.0000 mL | Freq: Every day | INTRAVENOUS | Status: DC
  Administered 2018-05-22: 13:00:00 30 mL via INTRAVENOUS
  Administered 2018-05-23 – 2018-05-24 (×2): 10 mL via INTRAVENOUS

## 2018-05-22 NOTE — Progress Notes (Signed)
[facility] at [place] NAME: Linda Mejia    MR#:  [id number]  DATE OF BIRTH:  December 31, 1955  SUBJECTIVE:  CHIEF COMPLAINT:   Chief Complaint  Patient presents with  . flu symptoms  . Altered Mental Status   Patient feeling better as of breath improved   REVIEW OF SYSTEMS:  Review of Systems  Constitutional: Negative for diaphoresis, fever, malaise/fatigue and weight loss.  HENT: Negative for ear discharge, ear pain, hearing loss, nosebleeds, sore throat and tinnitus.   Eyes: Negative for blurred vision and pain.  Respiratory: Negative for cough, hemoptysis, shortness of breath and wheezing.   Cardiovascular: Negative for chest pain, palpitations, orthopnea and leg swelling.  Gastrointestinal: Negative for abdominal pain, blood in stool, constipation, diarrhea, heartburn, nausea and vomiting.  Genitourinary: Negative for dysuria, frequency and urgency.  Musculoskeletal: Negative for back pain and myalgias.  Skin: Negative for itching and rash.  Neurological: Negative for dizziness, tingling, tremors, focal weakness, seizures, weakness and headaches.  Psychiatric/Behavioral: Negative for depression. The patient is not nervous/anxious.    DRUG ALLERGIES:  No Known Allergies VITALS:  Blood pressure 127/89, pulse 99, temperature 98.1 F (36.7 C), temperature source Oral, resp. rate 20, height 5\' 5"  (1.651 m), weight 98.4 kg, SpO2 91 %. PHYSICAL EXAMINATION:  Physical Exam HENT:     Head: Normocephalic and atraumatic.  Eyes:     Conjunctiva/sclera: Conjunctivae normal.     Pupils: Pupils are equal, round, and reactive to light.  Neck:     Musculoskeletal: Normal range of motion and neck supple.     Thyroid: No thyromegaly.     Trachea: No tracheal deviation.  Cardiovascular:     Rate and Rhythm: Normal rate and regular rhythm.     Heart sounds: Normal heart sounds.  Pulmonary:     Effort: Pulmonary effort is normal. No respiratory  distress.     Breath sounds: Decreased breath sounds present. No wheezing or rhonchi.  Chest:     Chest wall: No tenderness.  Abdominal:     General: Bowel sounds are normal. There is no distension.     Palpations: Abdomen is soft.     Tenderness: There is no abdominal tenderness.  Musculoskeletal: Normal range of motion.  Skin:    General: Skin is warm and dry.     Findings: No rash.  Neurological:     Mental Status: She is alert and oriented to person, place, and time.     Cranial Nerves: No cranial nerve deficit.    LABORATORY PANEL:  Female CBC Recent Labs  Lab 05/20/18 0505  WBC 11.4*  HGB 9.3*  HCT 30.2*  PLT 243   ------------------------------------------------------------------------------------------------------------------ Chemistries  Recent Labs  Lab 05/17/18 0539  05/20/18 0505  NA 137   < > 137  K 4.0   < > 3.9  CL 111   < > 102  CO2 21*   < > 31  GLUCOSE 137*   < > 116*  BUN 20   < > 9  CREATININE 0.95   < > 0.46  CALCIUM 6.6*   < > 7.6*  MG 2.6*  --   --   AST  --   --  17  ALT  --   --  13  ALKPHOS  --   --  96  BILITOT  --   --  0.4   < > = values in this interval not displayed.   RADIOLOGY:  No  results found. ASSESSMENT AND PLAN:   * Acute respiratory failure with hypoxia due to sepsis and multifocal pneumonia. -Continue Rocephin Wean oxygen  * Septic shock/Lactic acidosis.  Strep pneumonia in the blood, continue IV Rocephin  *Hypokalemia: Replaced  *CAD.  Continue aspirin.  *Hypertension.  Continue Lopressor but hold Norvasc.  *ADHD.  Continue home medication.  * Anemia of chronic dz     All the records are reviewed and case discussed with Care Management/Social Worker. Management plans discussed with the patient, nursing and they are in agreement.  CODE STATUS: Full Code  TOTAL TIME TAKING CARE OF THIS PATIENT: 86min minutes.   More than 50% of the time was spent in counseling/coordination of care: YES  POSSIBLE D/C  IN 2-3 DAYS, DEPENDING ON CLINICAL CONDITION.   Dustin Flock M.D on 05/22/2018 at 1:17 PM  Between 7am to 6pm - Pager - [phone]  After 6pm go to [email or web address] - Proofreader  [facility]  Office  [phone]  CC: Primary care physician; Arnetha Courser, MD  Note: This dictation was prepared with Dragon dictation along with smaller phrase technology. Any transcriptional errors that result from this process are unintentional.

## 2018-05-22 NOTE — Progress Notes (Signed)
Reports feeling much better; just weak. Has generalized aching/soreness primarily in left chest area with norco effective. IVF's , telemetry, foley discontinued. Up in chair and tolerated well. Has voided post foley removal. Will work on increase activity as tolerated. Pt well motivated.

## 2018-05-23 ENCOUNTER — Inpatient Hospital Stay: Payer: Medicare Other

## 2018-05-23 ENCOUNTER — Encounter: Payer: Self-pay | Admitting: Radiology

## 2018-05-23 LAB — CREATININE, SERUM
Creatinine, Ser: 0.49 mg/dL (ref 0.44–1.00)
GFR calc Af Amer: >60 mL/min (ref >60)
GFR calc non Af Amer: >60 mL/min (ref >60)

## 2018-05-23 LAB — PROCALCITONIN: Procalcitonin: 0.62 ng/mL

## 2018-05-23 MED ORDER — PREDNISONE 50 MG PO TABS
50.0000 mg | ORAL_TABLET | Freq: Every day | ORAL | Status: DC
  Administered 2018-05-23 – 2018-05-25 (×3): 50 mg via ORAL
  Filled 2018-05-23 (×3): qty 1

## 2018-05-23 MED ORDER — IOHEXOL 300 MG/ML  SOLN
75.0000 mL | Freq: Once | INTRAMUSCULAR | Status: AC | PRN
  Administered 2018-05-23: 12:00:00 75 mL via INTRAVENOUS

## 2018-05-23 MED ORDER — NYSTATIN 100000 UNIT/GM EX POWD
Freq: Two times a day (BID) | CUTANEOUS | Status: DC
  Administered 2018-05-23 – 2018-05-25 (×4): via TOPICAL
  Filled 2018-05-23: qty 15

## 2018-05-23 NOTE — Progress Notes (Signed)
Patient weaned to 1 liter O2, will continue to check on her.

## 2018-05-23 NOTE — Progress Notes (Signed)
Patient was able to ambulate to bathroom and take shower with assistance of daughter and nurse.  Patient felt so much better after shower.  Maintaining oxygen saturation.  Patient weaned from 3 liters to 2 liters [place].

## 2018-05-23 NOTE — Progress Notes (Signed)
[facility] [place] NAME: Linda Mejia    MR#:  [id number]  DATE OF BIRTH:  1956-03-11  SUBJECTIVE:  CHIEF COMPLAINT:   Chief Complaint  Patient presents with  . flu symptoms  . Altered Mental Status  Patient states that her breathing is improving, however has rash on her back.    REVIEW OF SYSTEMS:  Review of Systems  Constitutional: Negative for diaphoresis, fever, malaise/fatigue and weight loss.  HENT: Negative for ear discharge, ear pain, hearing loss, nosebleeds, sore throat and tinnitus.   Eyes: Negative for blurred vision and pain.  Respiratory: Negative for cough, hemoptysis, shortness of breath and wheezing.   Cardiovascular: Negative for chest pain, palpitations, orthopnea and leg swelling.  Gastrointestinal: Negative for abdominal pain, blood in stool, constipation, diarrhea, heartburn, nausea and vomiting.  Genitourinary: Negative for dysuria, frequency and urgency.  Musculoskeletal: Negative for back pain and myalgias.  Skin: Positive for rash. Negative for itching.  Neurological: Negative for dizziness, tingling, tremors, focal weakness, seizures, weakness and headaches.  Psychiatric/Behavioral: Negative for depression. The patient is not nervous/anxious.    DRUG ALLERGIES:  No Known Allergies VITALS:  Blood pressure 116/70, pulse 93, temperature 98.1 F (36.7 C), temperature source Oral, resp. rate 16, height 5\' 5"  (1.651 m), weight 98.4 kg, SpO2 97 %. PHYSICAL EXAMINATION:  Physical Exam HENT:     Head: Normocephalic and atraumatic.  Eyes:     Conjunctiva/sclera: Conjunctivae normal.     Pupils: Pupils are equal, round, and reactive to light.  Neck:     Musculoskeletal: Normal range of motion and neck supple.     Thyroid: No thyromegaly.     Trachea: No tracheal deviation.  Cardiovascular:     Rate and Rhythm: Normal rate and regular rhythm.     Heart sounds: Normal heart sounds.  Pulmonary:     Effort:  Pulmonary effort is normal. No respiratory distress.     Breath sounds: Decreased breath sounds present. No wheezing or rhonchi.  Chest:     Chest wall: No tenderness.  Abdominal:     General: Bowel sounds are normal. There is no distension.     Palpations: Abdomen is soft.     Tenderness: There is no abdominal tenderness.  Musculoskeletal: Normal range of motion.  Skin:    General: Skin is warm and dry.     Findings: Rash present. No erythema.  Neurological:     Mental Status: She is alert and oriented to person, place, and time.     Cranial Nerves: No cranial nerve deficit.    LABORATORY PANEL:  Female CBC Recent Labs  Lab 05/20/18 0505  WBC 11.4*  HGB 9.3*  HCT 30.2*  PLT 243   ------------------------------------------------------------------------------------------------------------------ Chemistries  Recent Labs  Lab 05/17/18 0539  05/20/18 0505 05/23/18 0518  NA 137   < > 137  --   K 4.0   < > 3.9  --   CL 111   < > 102  --   CO2 21*   < > 31  --   GLUCOSE 137*   < > 116*  --   BUN 20   < > 9  --   CREATININE 0.95   < > 0.46 0.49  CALCIUM 6.6*   < > 7.6*  --   MG 2.6*  --   --   --   AST  --   --  17  --   ALT  --   --  13  --   ALKPHOS  --   --  96  --   BILITOT  --   --  0.4  --    < > = values in this interval not displayed.   RADIOLOGY:  No results found. ASSESSMENT AND PLAN:   * Acute respiratory failure with hypoxia due to sepsis and multifocal pneumonia. -Continue Rocephin Wean oxygen Due to persistent shortness of breath and requirement of oxygen I will obtain a CT scan of the chest   * Septic shock/Lactic acidosis.  Strep pneumonia in the blood, continue IV Rocephin, will change to oral soon  *Hypokalemia: Replaced  *CAD.  Continue aspirin.  *Hypertension.  Continue Lopressor but hold Norvasc.  *ADHD.  Continue home medication.  * Anemia of chronic dz  *Rash in the back suspect this is related to sweating and laying in bed, will  treat with oral prednisone nystatin for the groin   All the records are reviewed and case discussed with Care Management/Social Worker. Management plans discussed with the patient, nursing and they are in agreement.  CODE STATUS: Full Code  TOTAL TIME TAKING CARE OF THIS PATIENT: 54min minutes.   More than 50% of the time was spent in counseling/coordination of care: YES  POSSIBLE D/C IN 2-3 DAYS, DEPENDING ON CLINICAL CONDITION.   Dustin Flock M.D on 05/23/2018 at 12:04 PM  Between 7am to 6pm - Pager - [phone]  After 6pm go to [email or web address] - Proofreader  [facility]  Office  [phone]  CC: Primary care physician; Arnetha Courser, MD  Note: This dictation was prepared with Dragon dictation along with smaller phrase technology. Any transcriptional errors that result from this process are unintentional.

## 2018-05-23 NOTE — Plan of Care (Signed)
  Problem: Activity: Goal: Ability to tolerate increased activity will improve Outcome: Progressing   Problem: Clinical Measurements: Goal: Ability to maintain a body temperature in the normal range will improve Outcome: Progressing   Problem: Respiratory: Goal: Ability to maintain adequate ventilation will improve Outcome: Progressing Goal: Ability to maintain a clear airway will improve Outcome: Progressing   Problem: Education: Goal: Knowledge of General Education information will improve Description Including pain rating scale, medication(s)/side effects and non-pharmacologic comfort measures Outcome: Progressing   Problem: Health Behavior/Discharge Planning: Goal: Ability to manage health-related needs will improve Outcome: Progressing   Problem: Clinical Measurements: Goal: Ability to maintain clinical measurements within normal limits will improve Outcome: Progressing Goal: Diagnostic test results will improve Outcome: Progressing Goal: Respiratory complications will improve Outcome: Progressing   Problem: Activity: Goal: Risk for activity intolerance will decrease Outcome: Progressing   Problem: Nutrition: Goal: Adequate nutrition will be maintained Outcome: Progressing   Problem: Coping: Goal: Level of anxiety will decrease Outcome: Progressing   Problem: Pain Managment: Goal: General experience of comfort will improve Outcome: Progressing   Problem: Safety: Goal: Ability to remain free from injury will improve Outcome: Progressing

## 2018-05-24 MED ORDER — CEFDINIR 300 MG PO CAPS
300.0000 mg | ORAL_CAPSULE | Freq: Two times a day (BID) | ORAL | Status: DC
  Administered 2018-05-24 – 2018-05-25 (×3): 300 mg via ORAL
  Filled 2018-05-24 (×4): qty 1

## 2018-05-24 NOTE — Care Management Important Message (Signed)
Important Message  Patient Details  Name: Linda Mejia MRN: [id number] Date of Birth: [birth year]   Medicare Important Message Given:  Yes    Juliann Pulse A Shameer Molstad 05/24/2018, 11:40 AM

## 2018-05-24 NOTE — Progress Notes (Signed)
Patient completely taken off O2

## 2018-05-24 NOTE — Plan of Care (Signed)
  Problem: Activity: Goal: Ability to tolerate increased activity will improve Outcome: Progressing   Problem: Clinical Measurements: Goal: Ability to maintain a body temperature in the normal range will improve Outcome: Progressing   Problem: Respiratory: Goal: Ability to maintain adequate ventilation will improve Outcome: Progressing Goal: Ability to maintain a clear airway will improve Outcome: Progressing   Problem: Education: Goal: Knowledge of General Education information will improve Description Including pain rating scale, medication(s)/side effects and non-pharmacologic comfort measures Outcome: Progressing   Problem: Health Behavior/Discharge Planning: Goal: Ability to manage health-related needs will improve Outcome: Progressing   Problem: Clinical Measurements: Goal: Ability to maintain clinical measurements within normal limits will improve Outcome: Progressing Goal: Diagnostic test results will improve Outcome: Progressing Goal: Respiratory complications will improve Outcome: Progressing   Problem: Activity: Goal: Risk for activity intolerance will decrease Outcome: Progressing   Problem: Nutrition: Goal: Adequate nutrition will be maintained Outcome: Progressing   Problem: Coping: Goal: Level of anxiety will decrease Outcome: Progressing   Problem: Pain Managment: Goal: General experience of comfort will improve Outcome: Progressing   Problem: Safety: Goal: Ability to remain free from injury will improve Outcome: Progressing

## 2018-05-24 NOTE — Progress Notes (Signed)
Physical Therapy Treatment Patient Details Name: Linda Mejia MRN: [id number] DOB: [birth year] Today's Date: 05/24/2018    History of Present Illness Pt is a 63 y.o. female with a known history of ADHD, aortic atherosclerosis, hypertension, pulmonary hypertension, CAD, chronic back pain, UTI and prediabetes.  The patient was sent to ED with complaints of shortness of breath and a cough with sputum.  She also complained of body aching, fever, chills, nausea and vomiting.  She was found septic with tachycardia, tachypnea and a lactic acidosis.  Chest x-ray showed right side multiple focal pneumonia.  Assessment includes: Acute respiratory failure with hypoxia due to sepsis and multifocal pneumonia, septic shock/lactic acidosis, hypokalemia, CAD, HTN, and anemia of chronic disease.     PT Comments    Pt agreeable to PT; denies pain and reports feeling much better. Pt notes she has been walking independently within the room (nursing aware). Pt demonstrates ambulation of 450 ft this session without LOB including ability to perform head turns, looking up/down and high knee march. Pt ambulating slower than baseline subjectively, but feels she could walk faster. O2 saturation remained 96% throughout; HR did increased from 103 to 121 with recovery to 104 bpm within 1 minute of seated rest. Educated on conservatively progressing activity level once home. Pt has ability to check HR and O2 saturation via phone, which demonstrated accuracy when compared with pulse oximeter. Pt does not feel she needs any further therapy when discharged; agreed. SW notified via treatment team note.    Follow Up Recommendations  No PT follow up     Equipment Recommendations       Recommendations for Other Services       Precautions / Restrictions Restrictions Weight Bearing Restrictions: No    Mobility  Bed Mobility Overal bed mobility: Independent                Transfers Overall transfer level: Independent                   Ambulation/Gait Ambulation/Gait assistance: Independent;Modified independent (Device/Increase time) Gait Distance (Feet): 450 Feet Assistive device: None Gait Pattern/deviations: Step-through pattern;Drifts right/left     General Gait Details: Decreased speed overall from pt subjective baseline, but pt feels she could walk faster. Able to demonstrate head turns, looking up/down as well as high knee march for 10 feet without LOB. O2 saturation remained 96%; HR increased from 103 to 121 bpm. Recovers to 104 in 1 minute   Stairs             Wheelchair Mobility    Modified Rankin (Stroke Patients Only)       Balance Overall balance assessment: Independent                                          Cognition Arousal/Alertness: Awake/alert Behavior During Therapy: WFL for tasks assessed/performed Overall Cognitive Status: Within Functional Limits for tasks assessed                                        Exercises      General Comments        Pertinent Vitals/Pain Pain Assessment: No/denies pain    Home Living  Prior Function            PT Goals (current goals can now be found in the care plan section) Progress towards PT goals: Progressing toward goals    Frequency    Min 2X/week      PT Plan Discharge plan needs to be updated    Co-evaluation              AM-PAC PT "6 Clicks" Mobility   Outcome Measure  Help needed turning from your back to your side while in a flat bed without using bedrails?: None Help needed moving from lying on your back to sitting on the side of a flat bed without using bedrails?: None Help needed moving to and from a bed to a chair (including a wheelchair)?: None Help needed standing up from a chair using your arms (e.g., wheelchair or bedside chair)?: None Help needed to walk in hospital room?: None Help needed climbing 3-5 steps with a  railing? : None 6 Click Score: 24    End of Session Equipment Utilized During Treatment: Gait belt Activity Tolerance: Patient tolerated treatment well Patient left: Other (comment)(Sitting up in bed/edge of bed)   PT Visit Diagnosis: Muscle weakness (generalized) (M62.81);Difficulty in walking, not elsewhere classified (R26.2);Unsteadiness on feet (R26.81)     Time: 3094-0768 PT Time Calculation (min) (ACUTE ONLY): 24 min  Charges:  $Gait Training: 23-37 mins                      Larae Grooms, PTA 05/24/2018, 4:21 PM

## 2018-05-24 NOTE — Progress Notes (Signed)
[facility] at [place] NAME: Pete Merten    MR#:  [id number]  DATE OF BIRTH:  1955/07/18  SUBJECTIVE:  CHIEF COMPLAINT:   Chief Complaint  Patient presents with  . flu symptoms  . Altered Mental Status    Pt breathing improving    REVIEW OF SYSTEMS:  Review of Systems  Constitutional: Negative for diaphoresis, fever, malaise/fatigue and weight loss.  HENT: Negative for ear discharge, ear pain, hearing loss, nosebleeds, sore throat and tinnitus.   Eyes: Negative for blurred vision and pain.  Respiratory: Negative for cough, hemoptysis, shortness of breath and wheezing.   Cardiovascular: Negative for chest pain, palpitations, orthopnea and leg swelling.  Gastrointestinal: Negative for abdominal pain, blood in stool, constipation, diarrhea, heartburn, nausea and vomiting.  Genitourinary: Negative for dysuria, frequency and urgency.  Musculoskeletal: Negative for back pain and myalgias.  Skin: Positive for rash. Negative for itching.  Neurological: Negative for dizziness, tingling, tremors, focal weakness, seizures, weakness and headaches.  Psychiatric/Behavioral: Negative for depression. The patient is not nervous/anxious.    DRUG ALLERGIES:  No Known Allergies VITALS:  Blood pressure 118/74, pulse (!) 102, temperature 98.4 F (36.9 C), temperature source Oral, resp. rate 20, height 5\' 5"  (1.651 m), weight 98.4 kg, SpO2 92 %. PHYSICAL EXAMINATION:  Physical Exam HENT:     Head: Normocephalic and atraumatic.  Eyes:     Conjunctiva/sclera: Conjunctivae normal.     Pupils: Pupils are equal, round, and reactive to light.  Neck:     Musculoskeletal: Normal range of motion and neck supple.     Thyroid: No thyromegaly.     Trachea: No tracheal deviation.  Cardiovascular:     Rate and Rhythm: Normal rate and regular rhythm.     Heart sounds: Normal heart sounds.  Pulmonary:     Effort: Pulmonary effort is normal. No respiratory  distress.     Breath sounds: Decreased breath sounds present. No wheezing or rhonchi.  Chest:     Chest wall: No tenderness.  Abdominal:     General: Bowel sounds are normal. There is no distension.     Palpations: Abdomen is soft.     Tenderness: There is no abdominal tenderness.  Musculoskeletal: Normal range of motion.  Skin:    General: Skin is warm and dry.     Findings: Rash present. No erythema.  Neurological:     Mental Status: She is alert and oriented to person, place, and time.     Cranial Nerves: No cranial nerve deficit.    LABORATORY PANEL:  Female CBC Recent Labs  Lab 05/20/18 0505  WBC 11.4*  HGB 9.3*  HCT 30.2*  PLT 243   ------------------------------------------------------------------------------------------------------------------ Chemistries  Recent Labs  Lab 05/20/18 0505 05/23/18 0518  NA 137  --   K 3.9  --   CL 102  --   CO2 31  --   GLUCOSE 116*  --   BUN 9  --   CREATININE 0.46 0.49  CALCIUM 7.6*  --   AST 17  --   ALT 13  --   ALKPHOS 96  --   BILITOT 0.4  --    RADIOLOGY:  Ct Chest W Contrast  Result Date: 05/23/2018 CLINICAL DATA:  Patient with history of pneumonia. EXAM: CT CHEST WITH CONTRAST TECHNIQUE: Multidetector CT imaging of the chest was performed during intravenous contrast administration. CONTRAST:  49mL OMNIPAQUE IOHEXOL 300 MG/ML  SOLN COMPARISON:  Chest radiograph 05/20/2018; chest CT  01/22/2018 FINDINGS: Cardiovascular: Heart is enlarged. Trace fluid superior pericardial recess. Coronary arterial and thoracic aortic vascular calcifications. Mediastinum/Nodes: Normal appearance of the esophagus. Prominent and mildly enlarged mediastinal lymph nodes including a 1.4 cm precarinal node (image 48; series 3) and a 1.0 cm pretracheal node (image 28; series 3). Note is made of a 2.0 cm right hilar node (image 60; series 3). Lungs/Pleura: Central airways are patent. Interval development of irregular patchy areas of consolidation and  ground-glass throughout the right upper, right middle and right lower lobes. Small layering right pleural effusion. Trace left effusion. Minimal left basilar atelectasis. No pneumothorax. Upper Abdomen: No acute process. Musculoskeletal: Thoracic spine degenerative changes. No aggressive or acute appearing osseous lesions. IMPRESSION: Patchy ground-glass and consolidative opacities throughout the right lung concerning for multifocal pneumonia. Small right and trace left pleural effusions. Mediastinal adenopathy, likely reactive in etiology. Electronically Signed   By: Lovey Newcomer M.D.   On: 05/23/2018 15:11   ASSESSMENT AND PLAN:   * Acute respiratory failure with hypoxia due to sepsis and multifocal pneumonia. -Change augmetin to ceftin - ct chest showes PNA will need f/u cxr in 2-3 weeks to ensure resolution   * Septic shock/Lactic acidosis.  Strep pneumonia in the blood,  Change oral antibitoic due to rash  *Hypokalemia: Replaced  *CAD.  Continue aspirin.  *Hypertension.  Continue Lopressor but hold Norvasc.  *ADHD.  Continue home medication.  * Anemia of chronic dz  *Rash in the back suspect this is related to sweating and laying in bed, will treat with oral prednisone nystatin for the groin   All the records are reviewed and case discussed with Care Management/Social Worker. Management plans discussed with the patient, nursing and they are in agreement.  CODE STATUS: Full Code  TOTAL TIME TAKING CARE OF THIS PATIENT: 39min minutes.   More than 50% of the time was spent in counseling/coordination of care: YES  POSSIBLE D/C IN 2-3 DAYS, DEPENDING ON CLINICAL CONDITION.   Dustin Flock M.D on 05/24/2018 at 10:56 AM  Between 7am to 6pm - Pager - [phone]  After 6pm go to [email or web address] - Proofreader  [facility]  Office  [phone]  CC: Primary care physician; Arnetha Courser, MD  Note: This dictation was prepared with Dragon  dictation along with smaller phrase technology. Any transcriptional errors that result from this process are unintentional.

## 2018-05-24 NOTE — Progress Notes (Signed)
Patient has been completely weaned off O2 with no problems  Has been off O2 since 1210 and has walked halls with no problems.

## 2018-05-25 MED ORDER — NYSTATIN 100000 UNIT/GM EX POWD: Freq: Two times a day (BID) | CUTANEOUS | 0 refills | Status: DC

## 2018-05-25 MED ORDER — GUAIFENESIN-DM 100-10 MG/5ML PO SYRP: 5.0000 mL | ORAL_SOLUTION | ORAL | 0 refills | Status: DC | PRN

## 2018-05-25 MED ORDER — CEFDINIR 300 MG PO CAPS: 300.0000 mg | ORAL_CAPSULE | Freq: Two times a day (BID) | ORAL | 0 refills | Status: DC

## 2018-05-25 NOTE — Progress Notes (Signed)
Pt for discharge home. A/o. No distress/ picc d/cd per protocol site held x 6 min. dsg over site. No s/s bleeding. Instructions discussed with pt/ meds diet activity and f/u discussed.  Verbalize understanding. Out via w/c w/o c/o.

## 2018-05-25 NOTE — Care Management (Signed)
Met with patient to discuss any needs for DC The patient is completely independent and states she needs nothing She called a friend to pick her up for DC and the patient DC is not complete.  The patient states she may not have transportation as a result of the friend not wanting to wait to give her a ride.  The patient is awaiting the central line to be removed before she can DC home.  The nurse has sent the consult to have it removed as soon as possible.  The nurse will let this RNCM know if transportation s needed for a possible [employer]

## 2018-05-25 NOTE — Discharge Summary (Signed)
[place], 63 y.o., DOB 11/19/1955, MRN [id number]. Admission date: 05/16/2018 Discharge Date 05/25/2018 Primary MD Arnetha Courser, MD Admitting Physician [name], MD  Admission Diagnosis  Altered mental status, unspecified altered mental status type [R41.82] Multifocal pneumonia [J18.9] Sepsis, due to unspecified organism, unspecified whether acute organ dysfunction present [facility]) [A41.9]  Discharge Diagnosis   Active Problems: Acute respiratory failure with hypoxia due to sepsis due to multifocal pneumonia Septic shock due to Streptococcus pneumonia Hypokalemia Coronary artery disease Hypertension ADHD Anemia of chronic disease Rash in the back possibly due to heat rash    Hospital Course  Linda Mejia  is a 63 y.o. female with a known history of ADHD, aortic atherosclerosis, hypertension, pulmonary hypertension, CAD, chronic back pain, UTI and prediabetes.  Patient presented with flulike symptoms and altered mental status and shortness of breath.  She was evaluated and showed multifocal pneumonia.  Patient was treated with antibiotics.  Blood culture showed strep pneumonia.  She was requiring high flow oxygen subsequently weaned down to room air.  Recommend a follow-up chest x-ray with primary care provider.           Consults  pulmonary/intensive care  Significant Tests:  See full reports for all details     Ct Chest W Contrast  Result Date: 05/23/2018 CLINICAL DATA:  Patient with history of pneumonia. EXAM: CT CHEST WITH CONTRAST TECHNIQUE: Multidetector CT imaging of the chest was performed during intravenous contrast administration. CONTRAST:  66mL OMNIPAQUE IOHEXOL 300 MG/ML  SOLN COMPARISON:  Chest radiograph 05/20/2018; chest CT 01/22/2018 FINDINGS: Cardiovascular: Heart is enlarged. Trace fluid superior pericardial recess. Coronary arterial and thoracic aortic vascular calcifications. Mediastinum/Nodes: Normal  appearance of the esophagus. Prominent and mildly enlarged mediastinal lymph nodes including a 1.4 cm precarinal node (image 48; series 3) and a 1.0 cm pretracheal node (image 28; series 3). Note is made of a 2.0 cm right hilar node (image 60; series 3). Lungs/Pleura: Central airways are patent. Interval development of irregular patchy areas of consolidation and ground-glass throughout the right upper, right middle and right lower lobes. Small layering right pleural effusion. Trace left effusion. Minimal left basilar atelectasis. No pneumothorax. Upper Abdomen: No acute process. Musculoskeletal: Thoracic spine degenerative changes. No aggressive or acute appearing osseous lesions. IMPRESSION: Patchy ground-glass and consolidative opacities throughout the right lung concerning for multifocal pneumonia. Small right and trace left pleural effusions. Mediastinal adenopathy, likely reactive in etiology. Electronically Signed   By: Lovey Newcomer M.D.   On: 05/23/2018 15:11   Dg Chest Port 1 View  Result Date: 05/20/2018 CLINICAL DATA:  Acute respiratory failure EXAM: PORTABLE CHEST 1 VIEW COMPARISON:  05/19/2018 FINDINGS: Cardiac enlargement. Right PICC line with tip over the cavoatrial junction region. No pneumothorax. Airspace consolidation in the right lung and left lung base, similar to previous study. No blunting of costophrenic angles. IMPRESSION: Bilateral pulmonary infiltrates, similar to previous study. Electronically Signed   By: Lucienne Capers M.D.   On: 05/20/2018 03:39   Dg Chest Port 1 View  Result Date: 05/19/2018 CLINICAL DATA:  Pneumonia.  Sepsis. EXAM: PORTABLE CHEST 1 VIEW COMPARISON:  05/16/2018 FINDINGS: PICC tip is at the cavoatrial junction. There has been marked progression of the consolidative infiltrates in the right midzone and right lung base. There is increased accentuation of the interstitial markings in the left midzone. Heart size and vascularity are within normal limits considering  the AP portable technique. Aortic atherosclerosis. No acute bone  abnormality.  Coronary artery stent in place. IMPRESSION: 1. Marked progression of the pneumonia in the right mid and lower lung zones. 2. PICC tip at the cavoatrial junction in good position. 3.  Aortic Atherosclerosis (ICD10-I70.0). 4. Increased interstitial markings in the mid left lung zone which may represent an early infiltrate. Electronically Signed   By: Lorriane Shire M.D.   On: 05/19/2018 15:44   Dg Chest Port 1 View  Result Date: 05/16/2018 CLINICAL DATA:  Shortness of breath EXAM: PORTABLE CHEST 1 VIEW COMPARISON:  03/26/2018 FINDINGS: Focal/patchy right upper lobe opacity, new. Patchy right lower lobe opacity, new. These findings are suspicious for multifocal pneumonia. Left lung is clear. No pleural effusion or pneumothorax. The heart is normal in size. IMPRESSION: Patchy right upper and lower lobe opacities, suspicious for multifocal pneumonia. Electronically Signed   By: Julian Hy M.D.   On: 05/16/2018 09:05   [place] Ekg Site Rite  Result Date: 05/16/2018 If Site Rite image not attached, placement could not be confirmed due to current cardiac rhythm.      Today   Subjective:   Linda Mejia patient doing well denies any complaint Objective:   Blood pressure 114/68, pulse 94, temperature 98.2 F (36.8 C), temperature source Oral, resp. rate 18, height 5\' 5"  (1.651 m), weight 98.4 kg, SpO2 93 %.  .  Intake/Output Summary (Last 24 hours) at 05/25/2018 1252 Last data filed at 05/24/2018 1415 Gross per 24 hour  Intake 120 ml  Output -  Net 120 ml    Exam VITAL SIGNS: Blood pressure 114/68, pulse 94, temperature 98.2 F (36.8 C), temperature source Oral, resp. rate 18, height 5\' 5"  (1.651 m), weight 98.4 kg, SpO2 93 %.  GENERAL:  63 y.o.-year-old patient lying in the bed with no acute distress.  EYES: Pupils equal, round, reactive to light and accommodation. No scleral icterus. Extraocular muscles intact.   HEENT: Head atraumatic, normocephalic. Oropharynx and nasopharynx clear.  NECK:  Supple, no jugular venous distention. No thyroid enlargement, no tenderness.  LUNGS: Normal breath sounds bilaterally, no wheezing, rales,rhonchi or crepitation. No use of accessory muscles of respiration.  CARDIOVASCULAR: S1, S2 normal. No murmurs, rubs, or gallops.  ABDOMEN: Soft, nontender, nondistended. Bowel sounds present. No organomegaly or mass.  EXTREMITIES: No pedal edema, cyanosis, or clubbing.  NEUROLOGIC: Cranial nerves II through XII are intact. Muscle strength 5/5 in all extremities. Sensation intact. Gait not checked.  PSYCHIATRIC: The patient is alert and oriented x 3.  SKIN: No obvious rash, lesion, or ulcer.   Data Review     CBC w Diff:  Lab Results  Component Value Date   WBC 11.4 (H) 05/20/2018   HGB 9.3 (L) 05/20/2018   HGB 12.6 03/16/2018   HCT 30.2 (L) 05/20/2018   HCT 37.9 06/05/2015   PLT 243 05/20/2018   PLT 314 06/05/2015   LYMPHOPCT 2 05/18/2018   LYMPHOPCT 51.4 08/03/2014   BANDSPCT 14 05/18/2018   MONOPCT 2 05/18/2018   MONOPCT 13.3 08/03/2014   EOSPCT 0 05/18/2018   EOSPCT 4.2 08/03/2014   BASOPCT 0 05/18/2018   BASOPCT 1.4 08/03/2014   CMP:  Lab Results  Component Value Date   NA 137 05/20/2018   NA 144 06/05/2015   NA 142 08/03/2014   K 3.9 05/20/2018   K 3.6 08/03/2014   CL 102 05/20/2018   CL 108 08/03/2014   CO2 31 05/20/2018   CO2 27 08/03/2014   BUN 9 05/20/2018   BUN 13 06/05/2015  BUN 15 08/03/2014   CREATININE 0.49 05/23/2018   CREATININE 0.76 03/09/2018   PROT 5.4 (L) 05/20/2018   PROT 6.9 08/03/2014   ALBUMIN 2.4 (L) 05/20/2018   ALBUMIN 3.9 08/03/2014   BILITOT 0.4 05/20/2018   BILITOT <0.1 (L) 08/03/2014   ALKPHOS 96 05/20/2018   ALKPHOS 63 08/03/2014   AST 17 05/20/2018   AST 24 08/03/2014   ALT 13 05/20/2018   ALT 12 (L) 08/03/2014  .  Micro Results Recent Results (from the past 240 hour(s))  Blood Culture (routine x 2)      Status: Abnormal   Collection Time: 05/16/18  8:52 AM  Result Value Ref Range Status   Specimen Description   Final    BLOOD RIGHT ANTECUBITAL Performed at [facility], [address], [place], [place] [address]    Special Requests   Final    BOTTLES DRAWN AEROBIC AND ANAEROBIC Blood Culture adequate volume Performed at [facility], [address] [place], [place] [address]    Culture  Setup Time   Final    GRAM POSITIVE COCCI IN PAIRS AND CHAINS ANAEROBIC BOTTLE ONLY CRITICAL RESULT CALLED TO, READ BACK BY AND VERIFIED WITH: Nancy Fetter [phone] [place] Performed at [place] [address] [place], [place] [address]    Culture STREPTOCOCCUS PNEUMONIAE (A)  Final   Report Status 05/19/2018 FINAL  Final   Organism ID, Bacteria STREPTOCOCCUS PNEUMONIAE  Final      Susceptibility   Streptococcus pneumoniae - MIC*    ERYTHROMYCIN <=0.12 SENSITIVE Sensitive     LEVOFLOXACIN 0.5 SENSITIVE Sensitive     VANCOMYCIN 0.5 SENSITIVE Sensitive     PENICILLIN (meningitis) <=0.06 SENSITIVE Sensitive     PENICILLIN (non-meningitis) <=0.06 SENSITIVE Sensitive     CEFTRIAXONE (non-meningitis) <=0.12 SENSITIVE Sensitive     CEFTRIAXONE (meningitis) <=0.12 SENSITIVE Sensitive     * STREPTOCOCCUS PNEUMONIAE  Blood Culture (routine x 2)     Status: None   Collection Time: 05/16/18  8:52 AM  Result Value Ref Range Status   Specimen Description BLOOD BLOOD LEFT HAND  Final   Special Requests   Final    BOTTLES DRAWN AEROBIC AND ANAEROBIC Blood Culture adequate volume   Culture   Final    NO GROWTH 5 DAYS Performed at [facility], [address] [place], [place] [address]    Report Status 05/21/2018 FINAL  Final  Blood Culture ID Panel (Reflexed)     Status: Abnormal   Collection Time: 05/16/18  8:52 AM  Result Value Ref Range Status   Enterococcus species NOT DETECTED NOT DETECTED Final   Listeria monocytogenes NOT DETECTED NOT DETECTED Final    Staphylococcus species NOT DETECTED NOT DETECTED Final   Staphylococcus aureus (BCID) NOT DETECTED NOT DETECTED Final   Streptococcus species DETECTED (A) NOT DETECTED Final    Comment: CRITICAL RESULT CALLED TO, READ BACK BY AND VERIFIED WITH: SHEEMA HALLAJI @2203  ON 05/16/2018 BY FMW    Streptococcus agalactiae NOT DETECTED NOT DETECTED Final   Streptococcus pneumoniae DETECTED (A) NOT DETECTED Final    Comment: CRITICAL RESULT CALLED TO, READ BACK BY AND VERIFIED WITH: SHEEMA HALLAJI @2203  ON 05/16/2018 BY FMW    Streptococcus pyogenes NOT DETECTED NOT DETECTED Final   Acinetobacter baumannii NOT DETECTED NOT DETECTED Final   Enterobacteriaceae species NOT DETECTED NOT DETECTED Final   Enterobacter cloacae complex NOT DETECTED NOT DETECTED Final   Escherichia coli NOT DETECTED NOT DETECTED Final  Klebsiella oxytoca NOT DETECTED NOT DETECTED Final   Klebsiella pneumoniae NOT DETECTED NOT DETECTED Final   Proteus species NOT DETECTED NOT DETECTED Final   Serratia marcescens NOT DETECTED NOT DETECTED Final   Haemophilus influenzae NOT DETECTED NOT DETECTED Final   Neisseria meningitidis NOT DETECTED NOT DETECTED Final   Pseudomonas aeruginosa NOT DETECTED NOT DETECTED Final   Candida albicans NOT DETECTED NOT DETECTED Final   Candida glabrata NOT DETECTED NOT DETECTED Final   Candida krusei NOT DETECTED NOT DETECTED Final   Candida parapsilosis NOT DETECTED NOT DETECTED Final   Candida tropicalis NOT DETECTED NOT DETECTED Final    Comment: Performed at [facility], [address] [place], [place] [address]  MRSA PCR Screening     Status: None   Collection Time: 05/16/18 12:19 PM  Result Value Ref Range Status   MRSA by PCR NEGATIVE NEGATIVE Final    Comment:        The GeneXpert MRSA Assay (FDA approved for NASAL specimens only), is one component of a comprehensive MRSA colonization surveillance program. It is not intended to diagnose MRSA infection nor to  guide or monitor treatment for MRSA infections. Performed at [facility], [address], [place], [place] [address]   Urine culture     Status: None   Collection Time: 05/16/18  1:32 PM  Result Value Ref Range Status   Specimen Description URINE, RANDOM  Final   Special Requests   Final    NONE Performed at [facility], [address] [place], [place] [address]    Culture NO GROWTH  Final   Report Status 05/17/2018 FINAL  Final  CULTURE, BLOOD (ROUTINE X 2) w Reflex to ID Panel     Status: None   Collection Time: 05/17/18 11:33 AM  Result Value Ref Range Status   Specimen Description BLOOD LEFT AC  Final   Special Requests   Final    BOTTLES DRAWN AEROBIC AND ANAEROBIC Blood Culture results may not be optimal due to an inadequate volume of blood received in culture bottles   Culture   Final    NO GROWTH 5 DAYS Performed at [facility], [address] [place], [place] [address]    Report Status 05/22/2018 FINAL  Final  CULTURE, BLOOD (ROUTINE X 2) w Reflex to ID Panel     Status: None   Collection Time: 05/17/18 11:44 AM  Result Value Ref Range Status   Specimen Description BLOOD LEFT HAND  Final   Special Requests   Final    BOTTLES DRAWN AEROBIC ONLY Blood Culture results may not be optimal due to an inadequate volume of blood received in culture bottles   Culture   Final    NO GROWTH 5 DAYS Performed at [facility], [address], [place], [place] [address]    Report Status 05/22/2018 FINAL  Final  Culture, respiratory (non-expectorated)     Status: None   Collection Time: 05/17/18  4:25 PM  Result Value Ref Range Status   Specimen Description   Final    TRACHEAL ASPIRATE Performed at [facility], [address] [place], [place] [address]    Special Requests   Final    NONE Performed at [facility], [address] [place], [place] [address]    Gram Stain   Final    ABUNDANT WBC PRESENT,  PREDOMINANTLY PMN RARE GRAM POSITIVE COCCI    Culture   Final    RARE  Consistent with normal respiratory flora. Performed at [facility], [place] [address] [place], [place] [address]    Report Status 05/21/2018 FINAL  Final        Code Status Orders  (From admission, onward)         Start     Ordered   05/16/18 1324  Full code  Continuous     05/16/18 1324        Code Status History    Date Active Date Inactive Code Status Order ID Comments User Context   05/16/2018 6468 05/16/2018 1324 DNR [id number]  [name], MD Inpatient   01/23/2018 2319 01/26/2018 1715 Full Code [id number]  [name], MD Inpatient   08/11/2016 1909 08/12/2016 1840 Full Code [id number]  [name], MD Inpatient   07/29/2016 1533 07/30/2016 2117 Full Code [id number]  [name], MD ED   09/21/2015 0229 09/22/2015 1418 Full Code [id number]  Saundra Shelling, MD Inpatient          Follow-up Information    Arnetha Courser, MD. Go on 06/02/2018.   Specialty:  Family Medicine Why:  AT 11:00 A.M. Contact information: [address] [place] [address] [phone]           Discharge Medications   Allergies as of 05/25/2018   No Known Allergies     Medication List    STOP taking these medications   fluticasone furoate-vilanterol 200-25 MCG/INH Aepb Commonly known as:  BREO ELLIPTA   ipratropium-albuterol 0.5-2.5 (3) MG/3ML Soln Commonly known as:  DUONEB   ranolazine 500 MG 12 hr tablet Commonly known as:  RANEXA   triamcinolone cream 0.1 % Commonly known as:  KENALOG     TAKE these medications   acyclovir 400 MG tablet Commonly known as:  ZOVIRAX take 1 tablet by mouth twice a day   amLODipine 5 MG tablet Commonly known as:  NORVASC TAKE 1 TABLET(5 MG) BY MOUTH DAILY FOR BLOOD PRESSURE What changed:  See the new instructions.   amphetamine-dextroamphetamine 30 MG tablet Commonly known as:  ADDERALL Take 30 mg by mouth 2 (two) times daily.   aspirin  EC 81 MG tablet Take 81 mg by mouth daily.   cefdinir 300 MG capsule Commonly known as:  OMNICEF Take 1 capsule (300 mg total) by mouth every 12 (twelve) hours for 5 days. Notes to patient:  Last given at 9:00am 2/11   cholecalciferol 1000 units tablet Commonly known as:  VITAMIN D Take 1,000 Units by mouth daily.   diazepam 10 MG tablet Commonly known as:  VALIUM Take 1 tablet (10 mg total) by mouth every 8 (eight) hours as needed for anxiety.   Fish Oil 1000 MG Caps Take 1 capsule by mouth daily.   fluticasone 50 MCG/ACT nasal spray Commonly known as:  FLONASE Place 2 sprays into both nostrils as needed. What changed:    when to take this  reasons to take this   guaiFENesin-dextromethorphan 100-10 MG/5ML syrup Commonly known as:  ROBITUSSIN DM Take 5 mLs by mouth every 4 (four) hours as needed for cough.   metoprolol tartrate 50 MG tablet Commonly known as:  LOPRESSOR TAKE 1 TABLET BY MOUTH TWICE A DAY   nitroGLYCERIN 0.4 MG SL tablet Commonly known as:  NITROSTAT Place 1 tablet (0.4 mg total) under the tongue every 5 (five) minutes as needed for chest pain.   nystatin powder Commonly known as:  MYCOSTATIN/NYSTOP Apply topically 2 (two) times daily.   pantoprazole 40  MG tablet Commonly known as:  PROTONIX Take 1 tablet (40 mg total) by mouth 2 (two) times daily before a meal for 30 days.   PRALUENT 75 MG/ML Sopn Generic drug:  Alirocumab Inject 75 mLs as directed every 14 (fourteen) days.   vitamin C 100 MG tablet Take 100 mg by mouth daily.          Total Time in preparing paper work, data evaluation and todays exam - 65 minutes  Dustin Flock M.D on 05/25/2018 at 12:52 [place]  [phone]

## 2018-05-27 ENCOUNTER — Telehealth: Payer: Self-pay

## 2018-05-27 NOTE — Telephone Encounter (Signed)
1st attempt to reach patient for TCM call and confirm hospital follow up appt.   Pt did not answer and unable to leave a message.

## 2018-05-28 ENCOUNTER — Inpatient Hospital Stay
Admission: EM | Admit: 2018-05-28 | Discharge: 2018-06-01 | DRG: 193 | Disposition: A | Discharge status: Home Health | Payer: Medicare Other | Attending: Internal Medicine | Admitting: Internal Medicine

## 2018-05-28 ENCOUNTER — Encounter: Payer: Self-pay | Admitting: Emergency Medicine

## 2018-05-28 ENCOUNTER — Other Ambulatory Visit: Payer: Self-pay

## 2018-05-28 ENCOUNTER — Emergency Department: Payer: Medicare Other

## 2018-05-28 DIAGNOSIS — R0902 Hypoxemia: Secondary | ICD-10-CM | POA: Diagnosis not present

## 2018-05-28 DIAGNOSIS — N6341 Unspecified lump in right breast, subareolar: Secondary | ICD-10-CM | POA: Diagnosis present

## 2018-05-28 DIAGNOSIS — E785 Hyperlipidemia, unspecified: Secondary | ICD-10-CM | POA: Diagnosis not present

## 2018-05-28 DIAGNOSIS — I2510 Atherosclerotic heart disease of native coronary artery without angina pectoris: Secondary | ICD-10-CM | POA: Diagnosis not present

## 2018-05-28 DIAGNOSIS — IMO0002 Reserved for concepts with insufficient information to code with codable children: Secondary | ICD-10-CM

## 2018-05-28 DIAGNOSIS — Z8711 Personal history of peptic ulcer disease: Secondary | ICD-10-CM

## 2018-05-28 DIAGNOSIS — I25119 Atherosclerotic heart disease of native coronary artery with unspecified angina pectoris: Secondary | ICD-10-CM | POA: Diagnosis not present

## 2018-05-28 DIAGNOSIS — J440 Chronic obstructive pulmonary disease with acute lower respiratory infection: Secondary | ICD-10-CM | POA: Diagnosis not present

## 2018-05-28 DIAGNOSIS — Z87891 Personal history of nicotine dependence: Secondary | ICD-10-CM

## 2018-05-28 DIAGNOSIS — I10 Essential (primary) hypertension: Secondary | ICD-10-CM | POA: Diagnosis not present

## 2018-05-28 DIAGNOSIS — M791 Myalgia, unspecified site: Secondary | ICD-10-CM | POA: Diagnosis not present

## 2018-05-28 DIAGNOSIS — I700 Atherosclerosis of aorta: Secondary | ICD-10-CM | POA: Diagnosis present

## 2018-05-28 DIAGNOSIS — I248 Other forms of acute ischemic heart disease: Secondary | ICD-10-CM | POA: Diagnosis not present

## 2018-05-28 DIAGNOSIS — F909 Attention-deficit hyperactivity disorder, unspecified type: Secondary | ICD-10-CM | POA: Diagnosis present

## 2018-05-28 DIAGNOSIS — K859 Acute pancreatitis without necrosis or infection, unspecified: Secondary | ICD-10-CM | POA: Diagnosis not present

## 2018-05-28 DIAGNOSIS — J90 Pleural effusion, not elsewhere classified: Secondary | ICD-10-CM | POA: Diagnosis not present

## 2018-05-28 DIAGNOSIS — J9601 Acute respiratory failure with hypoxia: Secondary | ICD-10-CM

## 2018-05-28 DIAGNOSIS — J918 Pleural effusion in other conditions classified elsewhere: Secondary | ICD-10-CM | POA: Diagnosis not present

## 2018-05-28 DIAGNOSIS — Z7982 Long term (current) use of aspirin: Secondary | ICD-10-CM

## 2018-05-28 DIAGNOSIS — R918 Other nonspecific abnormal finding of lung field: Secondary | ICD-10-CM | POA: Diagnosis not present

## 2018-05-28 DIAGNOSIS — J449 Chronic obstructive pulmonary disease, unspecified: Secondary | ICD-10-CM | POA: Diagnosis not present

## 2018-05-28 DIAGNOSIS — J181 Lobar pneumonia, unspecified organism: Secondary | ICD-10-CM | POA: Diagnosis not present

## 2018-05-28 DIAGNOSIS — R079 Chest pain, unspecified: Secondary | ICD-10-CM | POA: Diagnosis not present

## 2018-05-28 DIAGNOSIS — Z833 Family history of diabetes mellitus: Secondary | ICD-10-CM | POA: Diagnosis not present

## 2018-05-28 DIAGNOSIS — I252 Old myocardial infarction: Secondary | ICD-10-CM | POA: Diagnosis not present

## 2018-05-28 DIAGNOSIS — Z8249 Family history of ischemic heart disease and other diseases of the circulatory system: Secondary | ICD-10-CM

## 2018-05-28 DIAGNOSIS — J189 Pneumonia, unspecified organism: Secondary | ICD-10-CM | POA: Diagnosis not present

## 2018-05-28 DIAGNOSIS — Z823 Family history of stroke: Secondary | ICD-10-CM | POA: Diagnosis not present

## 2018-05-28 DIAGNOSIS — I200 Unstable angina: Secondary | ICD-10-CM | POA: Diagnosis not present

## 2018-05-28 DIAGNOSIS — B009 Herpesviral infection, unspecified: Secondary | ICD-10-CM | POA: Diagnosis present

## 2018-05-28 DIAGNOSIS — F419 Anxiety disorder, unspecified: Secondary | ICD-10-CM | POA: Diagnosis present

## 2018-05-28 DIAGNOSIS — R229 Localized swelling, mass and lump, unspecified: Secondary | ICD-10-CM

## 2018-05-28 DIAGNOSIS — I272 Pulmonary hypertension, unspecified: Secondary | ICD-10-CM | POA: Diagnosis not present

## 2018-05-28 DIAGNOSIS — R7989 Other specified abnormal findings of blood chemistry: Secondary | ICD-10-CM | POA: Diagnosis not present

## 2018-05-28 DIAGNOSIS — R0600 Dyspnea, unspecified: Secondary | ICD-10-CM

## 2018-05-28 DIAGNOSIS — I340 Nonrheumatic mitral (valve) insufficiency: Secondary | ICD-10-CM | POA: Diagnosis not present

## 2018-05-28 LAB — COMPREHENSIVE METABOLIC PANEL
ALT: 28 U/L (ref 0–44)
AST: 27 U/L (ref 15–41)
Albumin: 3.3 g/dL — ABNORMAL LOW (ref 3.5–5.0)
Alkaline Phosphatase: 72 U/L (ref 38–126)
Anion gap: 6 (ref 5–15)
BUN: 22 mg/dL (ref 8–23)
CO2: 27 mmol/L (ref 22–32)
Calcium: 7.8 mg/dL — ABNORMAL LOW (ref 8.9–10.3)
Chloride: 106 mmol/L (ref 98–111)
Creatinine, Ser: 0.82 mg/dL (ref 0.44–1.00)
GFR calc Af Amer: >60 mL/min (ref >60)
GFR calc non Af Amer: >60 mL/min (ref >60)
Glucose, Bld: 106 mg/dL — ABNORMAL HIGH (ref 70–99)
Potassium: 4.2 mmol/L (ref 3.5–5.1)
Sodium: 139 mmol/L (ref 135–145)
Total Bilirubin: 0.9 mg/dL (ref 0.3–1.2)
Total Protein: 7.3 g/dL (ref 6.5–8.1)

## 2018-05-28 LAB — CBC WITH DIFFERENTIAL/PLATELET
Abs Immature Granulocytes: 0.15 10*3/uL — ABNORMAL HIGH (ref 0.00–0.07)
Basophils Absolute: 0.1 10*3/uL (ref 0.0–0.1)
Basophils Relative: 1 %
Eosinophils Absolute: 0.1 10*3/uL (ref 0.0–0.5)
Eosinophils Relative: 1 %
HCT: 34.9 % — ABNORMAL LOW (ref 36.0–46.0)
Hemoglobin: 10.7 g/dL — ABNORMAL LOW (ref 12.0–15.0)
Immature Granulocytes: 2 %
Lymphocytes Relative: 31 %
Lymphs Abs: 3.2 10*3/uL (ref 0.7–4.0)
MCH: 30.3 pg (ref 26.0–34.0)
MCHC: 30.7 g/dL (ref 30.0–36.0)
MCV: 98.9 fL (ref 80.0–100.0)
Monocytes Absolute: 0.9 10*3/uL (ref 0.1–1.0)
Monocytes Relative: 9 %
Neutro Abs: 5.9 10*3/uL (ref 1.7–7.7)
Neutrophils Relative %: 56 %
Platelets: 789 10*3/uL — ABNORMAL HIGH (ref 150–400)
RBC: 3.53 MIL/uL — ABNORMAL LOW (ref 3.87–5.11)
RDW: 13.8 % (ref 11.5–15.5)
WBC: 10.3 10*3/uL (ref 4.0–10.5)
nRBC: 0 % (ref 0.0–0.2)

## 2018-05-28 LAB — LIPASE, BLOOD: Lipase: 90 U/L — ABNORMAL HIGH (ref 11–51)

## 2018-05-28 LAB — INFLUENZA PANEL BY PCR (TYPE A & B)
Influenza A By PCR: NEGATIVE
Influenza B By PCR: NEGATIVE

## 2018-05-28 MED ORDER — KETOROLAC TROMETHAMINE 30 MG/ML IJ SOLN
15.0000 mg | INTRAMUSCULAR | Status: AC
  Administered 2018-05-28: 15 mg via INTRAVENOUS
  Filled 2018-05-28: qty 1

## 2018-05-28 MED ORDER — SODIUM CHLORIDE 0.9 % IV BOLUS
1000.0000 mL | Freq: Once | INTRAVENOUS | Status: AC
  Administered 2018-05-28: 1000 mL via INTRAVENOUS

## 2018-05-28 MED ORDER — IOHEXOL 350 MG/ML SOLN
75.0000 mL | Freq: Once | INTRAVENOUS | Status: AC | PRN
  Administered 2018-05-28: 75 mL via INTRAVENOUS

## 2018-05-28 MED ORDER — ALBUTEROL SULFATE (2.5 MG/3ML) 0.083% IN NEBU
5.0000 mg | INHALATION_SOLUTION | Freq: Once | RESPIRATORY_TRACT | Status: AC
  Administered 2018-05-29: 5 mg via RESPIRATORY_TRACT
  Filled 2018-05-28: qty 6

## 2018-05-28 NOTE — ED Notes (Signed)
20g IV attempted x2 by this RN in CT.

## 2018-05-28 NOTE — ED Notes (Signed)
Pt placed on 2L O2 [place]

## 2018-05-28 NOTE — Progress Notes (Deleted)
The patient is a 63 year old female, she was admitted from 05/16/2020 2/11 for pneumonia and sepsis requiring vasopressors and ICU admission.  **CT chest 05/23/2018>> imaging personally reviewed, multifocal right-sided pneumonia, affecting the right upper lobe and right middle lobes, there is also a small right basal pleural effusion with area of atelectasis.

## 2018-05-28 NOTE — ED Notes (Signed)
EKG completed

## 2018-05-28 NOTE — ED Provider Notes (Signed)
[facility] Emergency Department Provider Note  ____________________________________________  Time seen: Approximately 11:35 PM  I have reviewed the triage vital signs and the nursing notes.   HISTORY  Chief Complaint Shortness of Breath    HPI Linda Mejia is a 63 y.o. female with a history of hypertension CAD and chronic back pain  who complains of shortness of breath, gradual onset earlier today, constant.  Also associated with sharp pleuritic chest pain in the center of the chest radiating to the back.  Moderate intensity.  Also complains of diffuse body aches and malaise.  Denies vomiting diarrhea or belly pain.  Denies cough or runny nose.  No fevers or chills.  She was recently admitted to the hospital for sepsis and multifocal pneumonia, discharged 3 days ago after improving.  She has been taking her antibiotics as prescribed.     Past Medical History:  Diagnosis Date  . Abnormal ankle brachial index (ABI) 12/02/2016  . ADHD (attention deficit hyperactivity disorder)   . Anxiety   . Aortic atherosclerosis ([place]) 12/02/2016   July 2018  . Chronic back pain   . Coronary artery disease   . Herpes genitalis in women   . History of stomach ulcers    x7 this year. Bleeding  . Hypertension   . Prediabetes 12/02/2016  . Pulmonary hypertension ([place]) 03/09/2018   Echo Oct 2019     Patient Active Problem List   Diagnosis Date Noted  . Sepsis ([place]) 05/16/2018  . Acute peptic ulcer of stomach   . Stomach irritation   . Abdominal pain, epigastric   . Acute gastric ulcer due to Helicobacter pylori   . Pulmonary hypertension ([place]) 03/09/2018  . Unstable angina ([place]) 01/23/2018  . Medication monitoring encounter 03/18/2017  . Colon cancer screening 03/18/2017  . Coronary artery calcification seen on CT scan 12/02/2016  . Aortic atherosclerosis ([place]) 12/02/2016  . Abnormal ankle brachial index (ABI) 12/02/2016  . Prediabetes 12/02/2016  . Fracture of  rib 08/28/2016  . Chest pain 08/11/2016  . Hx of tobacco use, presenting hazards to health 06/09/2016  . Degenerative arthritis of knee, bilateral 12/27/2015  . Obesity 12/27/2015  . GI bleed 09/21/2015  . Reflux esophagitis   . Other diseases of stomach and duodenum   . Multiple joint pain 06/05/2015  . Night sweats 06/05/2015  . Headache 04/12/2015  . Subclinical hypothyroidism 03/26/2015  . Herpes simplex type 2 infection 03/21/2015  . Rhinitis, allergic 03/21/2015  . Cervical pain 09/21/2014  . ADHD (attention deficit hyperactivity disorder), inattentive type 09/20/2014  . Episodic paroxysmal anxiety disorder 09/20/2014  . History of gastric ulcer 09/20/2014  . Abuse, drug or alcohol ([place]) 09/20/2014  . History of alcohol use disorder 09/20/2014  . HLD (hyperlipidemia) 10/03/2009  . Coronary artery disease involving native coronary artery of native heart with angina pectoris ([place]) 10/02/2009  . Hypertension goal BP (blood pressure) < 140/90 10/02/2009     Past Surgical History:  Procedure Laterality Date  . APPENDECTOMY    . CARDIAC CATHETERIZATION    . CESAREAN SECTION    . COLON SURGERY     interception  . ESOPHAGOGASTRODUODENOSCOPY (EGD) WITH PROPOFOL N/A 09/21/2015   Procedure: ESOPHAGOGASTRODUODENOSCOPY (EGD) WITH PROPOFOL;  Surgeon: Lucilla Lame, MD;  Location: [facility] ENDOSCOPY;  Service: Endoscopy;  Laterality: N/A;  . ESOPHAGOGASTRODUODENOSCOPY (EGD) WITH PROPOFOL N/A 07/30/2016   Procedure: ESOPHAGOGASTRODUODENOSCOPY (EGD) WITH PROPOFOL;  Surgeon: Jonathon Bellows, MD;  Location: [facility] ENDOSCOPY;  Service: Endoscopy;  Laterality: N/A;  .  ESOPHAGOGASTRODUODENOSCOPY (EGD) WITH PROPOFOL N/A 04/23/2018   Procedure: ESOPHAGOGASTRODUODENOSCOPY (EGD) WITH PROPOFOL;  Surgeon: Virgel Manifold, MD;  Location: [facility] ENDOSCOPY;  Service: Endoscopy;  Laterality: N/A;  . LEFT HEART CATH Right 01/25/2018   Procedure: Left Heart Cath and Coronary Angiography;  Surgeon: Dionisio David, MD;   Location: [place] [facility];  Service: Cardiovascular;  Laterality: Right;  . LEFT HEART CATH AND CORONARY ANGIOGRAPHY Right 08/12/2016   Procedure: Left Heart Cath and Coronary Angiography;  Surgeon: Dionisio David, MD;  Location: [place] [facility];  Service: Cardiovascular;  Laterality: Right;  . TONSILLECTOMY    . TUBAL LIGATION       Prior to Admission medications   Medication Sig Start Date End Date Taking? Authorizing Provider  acyclovir (ZOVIRAX) 400 MG tablet take 1 tablet by mouth twice a day Patient taking differently: Take 400 mg by mouth 2 (two) times daily.  11/13/17  Yes Lada, Satira Anis, MD  amLODipine (NORVASC) 5 MG tablet TAKE 1 TABLET(5 MG) BY MOUTH DAILY FOR BLOOD PRESSURE Patient taking differently: Take 5 mg by mouth daily.  04/03/18  Yes Lada, Satira Anis, MD  amphetamine-dextroamphetamine (ADDERALL) 30 MG tablet Take 30 mg by mouth 2 (two) times daily.  12/02/16  Yes Lada, Satira Anis, MD  Ascorbic Acid (VITAMIN C) 100 MG tablet Take 100 mg by mouth daily.   Yes [provider]  aspirin EC 81 MG tablet Take 81 mg by mouth daily.   Yes [provider]  cefdinir (OMNICEF) 300 MG capsule Take 1 capsule (300 mg total) by mouth every 12 (twelve) hours for 5 days. 05/25/18 05/30/18 Yes Dustin Flock, MD  cholecalciferol (VITAMIN D) 1000 units tablet Take 1,000 Units by mouth daily.   Yes [provider]  diazepam (VALIUM) 10 MG tablet Take 1 tablet (10 mg total) by mouth every 8 (eight) hours as needed for anxiety. 09/22/15  Yes Fritzi Mandes, MD  fluticasone (FLONASE) 50 MCG/ACT nasal spray Place 2 sprays into both nostrils as needed. Patient taking differently: Place 2 sprays into both nostrils daily as needed for allergies.  09/22/17  Yes Lada, Satira Anis, MD  guaiFENesin-dextromethorphan (ROBITUSSIN DM) 100-10 MG/5ML syrup Take 5 mLs by mouth every 4 (four) hours as needed for cough. 05/25/18  Yes Dustin Flock, MD  metoprolol tartrate (LOPRESSOR) 50 MG  tablet TAKE 1 TABLET BY MOUTH TWICE A DAY 03/15/18  Yes Lada, Satira Anis, MD  nystatin (MYCOSTATIN/NYSTOP) powder Apply topically 2 (two) times daily. 05/25/18  Yes Dustin Flock, MD  Omega-3 Fatty Acids (FISH OIL) 1000 MG CAPS Take 1 capsule by mouth daily.   Yes [provider]  PRALUENT 75 MG/ML SOPN Inject 75 mLs as directed every 14 (fourteen) days. 11/25/16  Yes [provider]  nitroGLYCERIN (NITROSTAT) 0.4 MG SL tablet Place 1 tablet (0.4 mg total) under the tongue every 5 (five) minutes as needed for chest pain. 01/26/18   Bettey Costa, MD  pantoprazole (PROTONIX) 40 MG tablet Take 1 tablet (40 mg total) by mouth 2 (two) times daily before a meal for 30 days. 04/23/18 05/23/18  Virgel Manifold, MD     Allergies Patient has no known allergies.   Family History  Problem Relation Age of Onset  . Diabetes Mother   . CVA Mother   . Heart disease Father   . Heart disease Sister   . Heart disease Brother   . Heart disease Sister   . Heart disease Sister   .  Heart disease Sister   . Heart disease Brother   . Heart disease Brother   . Heart disease Brother     Social History Social History   Tobacco Use  . Smoking status: Former Smoker    Packs/day: 1.00    Years: 30.00    Pack years: 30.00    Types: Cigarettes    Last attempt to quit: 2006    Years since quitting: 14.1  . Smokeless tobacco: Never Used  . Tobacco comment: smoking cessation materials not required  Substance Use Topics  . Alcohol use: Not Currently    Alcohol/week: 0.0 standard drinks    Frequency: Never  . Drug use: No    Review of Systems  Constitutional:   No fever or chills.  ENT:   No sore throat. No rhinorrhea. Cardiovascular:   Positive as above chest pain without syncope. Respiratory: Positive shortness of breath without cough. Gastrointestinal:   Negative for abdominal pain, vomiting and diarrhea.  Musculoskeletal: Positive body aches All other systems reviewed and are  negative except as documented above in ROS and HPI.  ____________________________________________   PHYSICAL EXAM:  VITAL SIGNS: ED Triage Vitals  Enc Vitals Group     BP 05/28/18 2111 (!) 124/52     Pulse Rate 05/28/18 2111 88     Resp 05/28/18 2111 18     Temp 05/28/18 2111 98 F (36.7 C)     Temp Source 05/28/18 2111 Oral     SpO2 05/28/18 2111 (!) 89 %     Weight 05/28/18 2113 216 lb 14.9 oz (98.4 kg)     Height 05/28/18 2113 5\' 5"  (1.651 m)     Head Circumference --      Peak Flow --      Pain Score 05/28/18 2112 7     Pain Loc --      Pain Edu? --      Excl. in [place]? --     Vital signs reviewed, nursing assessments reviewed.   Constitutional:   Alert and oriented.  Ill-appearing. Eyes:   Conjunctivae are normal. EOMI. PERRL. ENT      Head:   Normocephalic and atraumatic.      Nose:   No congestion/rhinnorhea.       Mouth/Throat:   Dry mucous membranes, no pharyngeal erythema. No peritonsillar mass.       Neck:   No meningismus. Full ROM. Hematological/Lymphatic/Immunilogical:   No cervical lymphadenopathy. Cardiovascular:   RRR. Symmetric bilateral radial and DP pulses.  No murmurs. Cap refill less than 2 seconds. Respiratory:   Normal respiratory effort without tachypnea/retractions.  Diffuse rhonchi in the right lung and left base Gastrointestinal:   Soft and nontender. Non distended. There is no CVA tenderness.  No rebound, rigidity, or guarding. Musculoskeletal:   Normal range of motion in all extremities. No joint effusions.  No lower extremity tenderness.  No edema. Neurologic:   Normal speech and language.  Motor grossly intact. No acute focal neurologic deficits are appreciated.  Skin:    Skin is warm, dry and intact. No rash noted.  No petechiae, purpura, or bullae.  ____________________________________________    LABS (pertinent positives/negatives) (all labs ordered are listed, but only abnormal results are displayed) Labs Reviewed  COMPREHENSIVE  METABOLIC PANEL - Abnormal; Notable for the following components:      Result Value   Glucose, Bld 106 (*)    Calcium 7.8 (*)    Albumin 3.3 (*)    All other components within  normal limits  CBC WITH DIFFERENTIAL/PLATELET - Abnormal; Notable for the following components:   RBC 3.53 (*)    Hemoglobin 10.7 (*)    HCT 34.9 (*)    Platelets 789 (*)    Abs Immature Granulocytes 0.15 (*)    All other components within normal limits  LIPASE, BLOOD - Abnormal; Notable for the following components:   Lipase 90 (*)    All other components within normal limits  INFLUENZA PANEL BY PCR (TYPE A & B)  TROPONIN I   ____________________________________________   EKG  Interpreted by me Sinus rhythm rate of 82, normal axis and intervals.  Poor R wave progression.  Normal ST segments.  T wave inversions in 3 aVF and V3 which are new compared to previous EKGs.  ____________________________________________    RADIOLOGY  Ct Angio Chest Pe W And/or Wo Contrast  Result Date: 05/29/2018 CLINICAL DATA:  Pancreatitis and abdomen pain. EXAM: CT ANGIOGRAPHY CHEST CT ABDOMEN AND PELVIS WITH CONTRAST TECHNIQUE: Multidetector CT imaging of the chest was performed using the standard protocol during bolus administration of intravenous contrast. Multiplanar CT image reconstructions and MIPs were obtained to evaluate the vascular anatomy. Multidetector CT imaging of the abdomen and pelvis was performed using the standard protocol during bolus administration of intravenous contrast. CONTRAST:  26mL OMNIPAQUE IOHEXOL 350 MG/ML SOLN COMPARISON:  CT abdomen pelvis April 09, 2018 FINDINGS: CTA CHEST FINDINGS Cardiovascular: Satisfactory opacification of the pulmonary arteries to the segmental level. No evidence of pulmonary embolism. Normal heart size. No pericardial effusion. Mediastinum/Nodes: Mild prominent lymph nodes identified in the mediastinum largest measures 1.4 cm in short axis. No hilar lymphadenopathy is  identified. The trachea is normal. The esophagus is unremarkable. There is question 5 cm mass in the retroareolar right breast. Lungs/Pleura: Patchy consolidation is identified throughout right upper lobe, right middle lobe and to a lesser degree in the right lower lobe, inferior left upper lobe. There are bilateral pleural effusions, moderate right, small left. Musculoskeletal: Degenerative joint changes of the spine are identified. Review of the MIP images confirms the above findings. CT ABDOMEN and PELVIS FINDINGS Hepatobiliary: No focal liver lesion is identified. Minimal fluid is identified surrounding the gallbladder. The gallbladder is otherwise normal. Biliary tree is normal. Pancreas: Unremarkable. No pancreatic ductal dilatation or surrounding inflammatory changes. Spleen: Normal in size without focal abnormality. Adrenals/Urinary Tract: Bilateral adrenal glands are normal. Left renal vascular calcification is noted. No kidney stones are identified bilaterally. There is no hydronephrosis bilaterally. The bladder is normal. Stomach/Bowel: Stomach is within normal limits. Patient status post prior appendectomy. No evidence of bowel wall thickening, distention, or inflammatory changes. Vascular/Lymphatic: Aortic atherosclerosis. No enlarged abdominal or pelvic lymph nodes. Reproductive: Uterus and bilateral adnexa are unremarkable. Other: None. Musculoskeletal: Degenerative joint changes of the spine are noted. Review of the MIP images confirms the above findings. IMPRESSION: No pulmonary embolus. Patchy consolidation in both lung, nonspecific multifocal pneumonia is not excluded. Moderate right pleural effusion and small left pleural effusion. Mild enlarged mediastinal lymph nodes, which may be reactive lymph nodes. Question 5 cm mass in the retroareolar right breast. Further evaluation with diagnostic mammogram and possible ultrasound are recommended. No acute abnormality identified in the abdomen and  pelvis. Minimal fluid identified around gallbladder, nonspecific. Electronically Signed   By: Abelardo Diesel M.D.   On: 05/29/2018 00:20   Ct Abdomen Pelvis W Contrast  Result Date: 05/29/2018 CLINICAL DATA:  Pancreatitis and abdomen pain. EXAM: CT ANGIOGRAPHY CHEST CT ABDOMEN AND PELVIS WITH CONTRAST  TECHNIQUE: Multidetector CT imaging of the chest was performed using the standard protocol during bolus administration of intravenous contrast. Multiplanar CT image reconstructions and MIPs were obtained to evaluate the vascular anatomy. Multidetector CT imaging of the abdomen and pelvis was performed using the standard protocol during bolus administration of intravenous contrast. CONTRAST:  30mL OMNIPAQUE IOHEXOL 350 MG/ML SOLN COMPARISON:  CT abdomen pelvis April 09, 2018 FINDINGS: CTA CHEST FINDINGS Cardiovascular: Satisfactory opacification of the pulmonary arteries to the segmental level. No evidence of pulmonary embolism. Normal heart size. No pericardial effusion. Mediastinum/Nodes: Mild prominent lymph nodes identified in the mediastinum largest measures 1.4 cm in short axis. No hilar lymphadenopathy is identified. The trachea is normal. The esophagus is unremarkable. There is question 5 cm mass in the retroareolar right breast. Lungs/Pleura: Patchy consolidation is identified throughout right upper lobe, right middle lobe and to a lesser degree in the right lower lobe, inferior left upper lobe. There are bilateral pleural effusions, moderate right, small left. Musculoskeletal: Degenerative joint changes of the spine are identified. Review of the MIP images confirms the above findings. CT ABDOMEN and PELVIS FINDINGS Hepatobiliary: No focal liver lesion is identified. Minimal fluid is identified surrounding the gallbladder. The gallbladder is otherwise normal. Biliary tree is normal. Pancreas: Unremarkable. No pancreatic ductal dilatation or surrounding inflammatory changes. Spleen: Normal in size without  focal abnormality. Adrenals/Urinary Tract: Bilateral adrenal glands are normal. Left renal vascular calcification is noted. No kidney stones are identified bilaterally. There is no hydronephrosis bilaterally. The bladder is normal. Stomach/Bowel: Stomach is within normal limits. Patient status post prior appendectomy. No evidence of bowel wall thickening, distention, or inflammatory changes. Vascular/Lymphatic: Aortic atherosclerosis. No enlarged abdominal or pelvic lymph nodes. Reproductive: Uterus and bilateral adnexa are unremarkable. Other: None. Musculoskeletal: Degenerative joint changes of the spine are noted. Review of the MIP images confirms the above findings. IMPRESSION: No pulmonary embolus. Patchy consolidation in both lung, nonspecific multifocal pneumonia is not excluded. Moderate right pleural effusion and small left pleural effusion. Mild enlarged mediastinal lymph nodes, which may be reactive lymph nodes. Question 5 cm mass in the retroareolar right breast. Further evaluation with diagnostic mammogram and possible ultrasound are recommended. No acute abnormality identified in the abdomen and pelvis. Minimal fluid identified around gallbladder, nonspecific. Electronically Signed   By: Abelardo Diesel M.D.   On: 05/29/2018 00:20    ____________________________________________   PROCEDURES Procedures  ____________________________________________  DIFFERENTIAL DIAGNOSIS   Recurrent pneumonia, pleural effusion, pulmonary embolism, non-STEMI, viral syndrome, influenza  CLINICAL IMPRESSION / ASSESSMENT AND PLAN / ED COURSE  Pertinent labs & imaging results that were available during my care of the patient were reviewed by me and considered in my medical decision making (see chart for details).    Patient presents with chest pain and shortness of breath after recent hospitalization for sepsis and multifocal pneumonia.  Ill-appearing, mild hypoxia requiring supplemental oxygen.  Labs  unremarkable, CT angiogram negative for PE but does show persistent multifocal infiltrates.  With her persistent respiratory failure with hypoxia, nonreassuring clinical exam, she will need to be rehospitalized.  She is not having specific ACS symptoms, but with her EKG changes I will order a troponin as well.  Would not empirically heparinize.      ____________________________________________   FINAL CLINICAL IMPRESSION(S) / ED DIAGNOSES    Final diagnoses:  Myalgia  Nonspecific chest pain  Acute respiratory failure with hypoxia   ED Discharge Orders    None      Portions of this note  were generated with dragon dictation software. Dictation errors may occur despite best attempts at proofreading.   Carrie Mew, MD 05/29/18 [phone]

## 2018-05-28 NOTE — Telephone Encounter (Signed)
2nd attempt to reach patient. No answer and unable to leave message.

## 2018-05-28 NOTE — ED Triage Notes (Signed)
Pt arrives POV to triage with SOB. Pt was DC'd from hospital on Tuesday for pneumonia. Pt is tearful at this time in triage.

## 2018-05-28 NOTE — ED Notes (Signed)
Pt leaving for CT.  

## 2018-05-29 ENCOUNTER — Inpatient Hospital Stay: Payer: Medicare Other

## 2018-05-29 ENCOUNTER — Other Ambulatory Visit: Payer: Self-pay

## 2018-05-29 ENCOUNTER — Inpatient Hospital Stay
Admit: 2018-05-29 | Discharge: 2018-05-29 | Disposition: A | Discharge status: Home / Self Care | Payer: Medicare Other | Attending: Internal Medicine | Admitting: Internal Medicine

## 2018-05-29 DIAGNOSIS — J440 Chronic obstructive pulmonary disease with acute lower respiratory infection: Secondary | ICD-10-CM | POA: Diagnosis present

## 2018-05-29 DIAGNOSIS — Z87891 Personal history of nicotine dependence: Secondary | ICD-10-CM | POA: Diagnosis not present

## 2018-05-29 DIAGNOSIS — I10 Essential (primary) hypertension: Secondary | ICD-10-CM | POA: Diagnosis present

## 2018-05-29 DIAGNOSIS — Z823 Family history of stroke: Secondary | ICD-10-CM | POA: Diagnosis not present

## 2018-05-29 DIAGNOSIS — I252 Old myocardial infarction: Secondary | ICD-10-CM | POA: Diagnosis not present

## 2018-05-29 DIAGNOSIS — I248 Other forms of acute ischemic heart disease: Secondary | ICD-10-CM | POA: Diagnosis present

## 2018-05-29 DIAGNOSIS — Z833 Family history of diabetes mellitus: Secondary | ICD-10-CM | POA: Diagnosis not present

## 2018-05-29 DIAGNOSIS — R0902 Hypoxemia: Secondary | ICD-10-CM | POA: Diagnosis not present

## 2018-05-29 DIAGNOSIS — M791 Myalgia, unspecified site: Secondary | ICD-10-CM | POA: Diagnosis present

## 2018-05-29 DIAGNOSIS — B009 Herpesviral infection, unspecified: Secondary | ICD-10-CM | POA: Diagnosis present

## 2018-05-29 DIAGNOSIS — F909 Attention-deficit hyperactivity disorder, unspecified type: Secondary | ICD-10-CM | POA: Diagnosis present

## 2018-05-29 DIAGNOSIS — I2510 Atherosclerotic heart disease of native coronary artery without angina pectoris: Secondary | ICD-10-CM | POA: Diagnosis present

## 2018-05-29 DIAGNOSIS — E785 Hyperlipidemia, unspecified: Secondary | ICD-10-CM | POA: Diagnosis present

## 2018-05-29 DIAGNOSIS — I272 Pulmonary hypertension, unspecified: Secondary | ICD-10-CM | POA: Diagnosis present

## 2018-05-29 DIAGNOSIS — Z8249 Family history of ischemic heart disease and other diseases of the circulatory system: Secondary | ICD-10-CM | POA: Diagnosis not present

## 2018-05-29 DIAGNOSIS — Z8711 Personal history of peptic ulcer disease: Secondary | ICD-10-CM | POA: Diagnosis not present

## 2018-05-29 DIAGNOSIS — F419 Anxiety disorder, unspecified: Secondary | ICD-10-CM | POA: Diagnosis present

## 2018-05-29 DIAGNOSIS — J9601 Acute respiratory failure with hypoxia: Secondary | ICD-10-CM | POA: Diagnosis present

## 2018-05-29 DIAGNOSIS — J189 Pneumonia, unspecified organism: Secondary | ICD-10-CM | POA: Diagnosis present

## 2018-05-29 DIAGNOSIS — N6341 Unspecified lump in right breast, subareolar: Secondary | ICD-10-CM | POA: Diagnosis present

## 2018-05-29 DIAGNOSIS — J918 Pleural effusion in other conditions classified elsewhere: Secondary | ICD-10-CM | POA: Diagnosis present

## 2018-05-29 DIAGNOSIS — Z7982 Long term (current) use of aspirin: Secondary | ICD-10-CM | POA: Diagnosis not present

## 2018-05-29 DIAGNOSIS — I700 Atherosclerosis of aorta: Secondary | ICD-10-CM | POA: Diagnosis present

## 2018-05-29 LAB — GLUCOSE, CAPILLARY: Glucose-Capillary: 90 mg/dL (ref 70–99)

## 2018-05-29 LAB — COMPREHENSIVE METABOLIC PANEL
ALT: 26 U/L (ref 0–44)
AST: 19 U/L (ref 15–41)
Albumin: 3.1 g/dL — ABNORMAL LOW (ref 3.5–5.0)
Alkaline Phosphatase: 67 U/L (ref 38–126)
Anion gap: 7 (ref 5–15)
BUN: 16 mg/dL (ref 8–23)
CO2: 27 mmol/L (ref 22–32)
Calcium: 7.8 mg/dL — ABNORMAL LOW (ref 8.9–10.3)
Chloride: 105 mmol/L (ref 98–111)
Creatinine, Ser: 0.79 mg/dL (ref 0.44–1.00)
GFR calc Af Amer: >60 mL/min (ref >60)
GFR calc non Af Amer: >60 mL/min (ref >60)
Glucose, Bld: 103 mg/dL — ABNORMAL HIGH (ref 70–99)
Potassium: 3.8 mmol/L (ref 3.5–5.1)
Sodium: 139 mmol/L (ref 135–145)
Total Bilirubin: 0.6 mg/dL (ref 0.3–1.2)
Total Protein: 7 g/dL (ref 6.5–8.1)

## 2018-05-29 LAB — CBC
HCT: 33.3 % — ABNORMAL LOW (ref 36.0–46.0)
Hemoglobin: 10.3 g/dL — ABNORMAL LOW (ref 12.0–15.0)
MCH: 30.7 pg (ref 26.0–34.0)
MCHC: 30.9 g/dL (ref 30.0–36.0)
MCV: 99.1 fL (ref 80.0–100.0)
Platelets: 695 10*3/uL — ABNORMAL HIGH (ref 150–400)
RBC: 3.36 MIL/uL — ABNORMAL LOW (ref 3.87–5.11)
RDW: 13.9 % (ref 11.5–15.5)
WBC: 9.6 10*3/uL (ref 4.0–10.5)
nRBC: 0 % (ref 0.0–0.2)

## 2018-05-29 LAB — TROPONIN I
Troponin I: 0.06 ng/mL (ref <0.03)
Troponin I: 0.07 ng/mL (ref <0.03)
Troponin I: 0.06 ng/mL (ref <0.03)
Troponin I: 0.07 ng/mL (ref <0.03)

## 2018-05-29 LAB — LIPID PANEL
Cholesterol: 194 mg/dL (ref 0–200)
HDL: 34 mg/dL — ABNORMAL LOW (ref >40)
LDL Cholesterol: 119 mg/dL — ABNORMAL HIGH (ref 0–99)
Total CHOL/HDL Ratio: 5.7 RATIO
Triglycerides: 203 mg/dL — ABNORMAL HIGH (ref <150)
VLDL: 41 mg/dL — ABNORMAL HIGH (ref 0–40)

## 2018-05-29 LAB — BRAIN NATRIURETIC PEPTIDE: B Natriuretic Peptide: 723 pg/mL — ABNORMAL HIGH (ref 0.0–100.0)

## 2018-05-29 MED ORDER — ACETAMINOPHEN 325 MG PO TABS
650.0000 mg | ORAL_TABLET | Freq: Four times a day (QID) | ORAL | Status: DC | PRN
  Administered 2018-05-29: 650 mg via ORAL
  Filled 2018-05-29 (×2): qty 2

## 2018-05-29 MED ORDER — NITROGLYCERIN 0.4 MG SL SUBL: 0.4000 mg | SUBLINGUAL_TABLET | SUBLINGUAL | Status: DC | PRN

## 2018-05-29 MED ORDER — MORPHINE SULFATE (PF) 4 MG/ML IV SOLN
4.0000 mg | Freq: Once | INTRAVENOUS | Status: AC
  Administered 2018-05-29: 4 mg via INTRAVENOUS
  Filled 2018-05-29: qty 1

## 2018-05-29 MED ORDER — DOCUSATE SODIUM 100 MG PO CAPS
100.0000 mg | ORAL_CAPSULE | Freq: Two times a day (BID) | ORAL | Status: DC
  Administered 2018-05-29 – 2018-06-01 (×7): 100 mg via ORAL
  Filled 2018-05-29 (×7): qty 1

## 2018-05-29 MED ORDER — IPRATROPIUM BROMIDE 0.02 % IN SOLN
0.5000 mg | Freq: Four times a day (QID) | RESPIRATORY_TRACT | Status: DC | PRN
  Administered 2018-05-30: 0.5 mg via RESPIRATORY_TRACT
  Filled 2018-05-29 (×2): qty 2.5

## 2018-05-29 MED ORDER — ONDANSETRON HCL 4 MG/2ML IJ SOLN: 4.0000 mg | Freq: Four times a day (QID) | INTRAMUSCULAR | Status: DC | PRN

## 2018-05-29 MED ORDER — ONDANSETRON HCL 4 MG/2ML IJ SOLN
4.0000 mg | Freq: Once | INTRAMUSCULAR | Status: AC
  Administered 2018-05-29: 4 mg via INTRAVENOUS
  Filled 2018-05-29: qty 2

## 2018-05-29 MED ORDER — METOPROLOL TARTRATE 50 MG PO TABS
50.0000 mg | ORAL_TABLET | Freq: Two times a day (BID) | ORAL | Status: DC
  Administered 2018-05-29 – 2018-06-01 (×5): 50 mg via ORAL
  Filled 2018-05-29 (×7): qty 1

## 2018-05-29 MED ORDER — ONDANSETRON HCL 4 MG PO TABS
4.0000 mg | ORAL_TABLET | Freq: Four times a day (QID) | ORAL | Status: DC | PRN
  Filled 2018-05-29: qty 1

## 2018-05-29 MED ORDER — PANTOPRAZOLE SODIUM 40 MG PO TBEC
40.0000 mg | DELAYED_RELEASE_TABLET | Freq: Two times a day (BID) | ORAL | Status: DC
  Administered 2018-05-29 – 2018-06-01 (×8): 40 mg via ORAL
  Filled 2018-05-29 (×8): qty 1

## 2018-05-29 MED ORDER — FLUTICASONE PROPIONATE 50 MCG/ACT NA SUSP
2.0000 | Freq: Every day | NASAL | Status: DC | PRN
  Filled 2018-05-29: qty 16

## 2018-05-29 MED ORDER — ASPIRIN EC 81 MG PO TBEC
81.0000 mg | DELAYED_RELEASE_TABLET | Freq: Every day | ORAL | Status: DC
  Administered 2018-05-29 – 2018-06-01 (×4): 81 mg via ORAL
  Filled 2018-05-29 (×4): qty 1

## 2018-05-29 MED ORDER — BISACODYL 5 MG PO TBEC
5.0000 mg | DELAYED_RELEASE_TABLET | Freq: Every day | ORAL | Status: DC | PRN
  Filled 2018-05-29: qty 1

## 2018-05-29 MED ORDER — FUROSEMIDE 10 MG/ML IJ SOLN
20.0000 mg | Freq: Once | INTRAMUSCULAR | Status: AC
  Administered 2018-05-29: 20 mg via INTRAVENOUS

## 2018-05-29 MED ORDER — TRAZODONE HCL 50 MG PO TABS
25.0000 mg | ORAL_TABLET | Freq: Every evening | ORAL | Status: DC | PRN
  Administered 2018-05-29 – 2018-05-30 (×2): 25 mg via ORAL
  Filled 2018-05-29: qty 0.5
  Filled 2018-05-29: qty 1

## 2018-05-29 MED ORDER — ACETAMINOPHEN 650 MG RE SUPP
650.0000 mg | Freq: Four times a day (QID) | RECTAL | Status: DC | PRN
  Filled 2018-05-29: qty 1

## 2018-05-29 MED ORDER — FUROSEMIDE 10 MG/ML IJ SOLN
40.0000 mg | Freq: Once | INTRAMUSCULAR | Status: DC
  Filled 2018-05-29: qty 4

## 2018-05-29 MED ORDER — ACYCLOVIR 200 MG PO CAPS
400.0000 mg | ORAL_CAPSULE | Freq: Two times a day (BID) | ORAL | Status: DC
  Administered 2018-05-29 – 2018-06-01 (×7): 400 mg via ORAL
  Filled 2018-05-29 (×9): qty 2

## 2018-05-29 MED ORDER — SODIUM CHLORIDE 0.9 % IV BOLUS: 1000.0000 mL | Freq: Once | INTRAVENOUS | Status: DC

## 2018-05-29 MED ORDER — VITAMIN D3 25 MCG (1000 UNIT) PO TABS
1000.0000 [IU] | ORAL_TABLET | Freq: Every day | ORAL | Status: DC
  Administered 2018-05-29 – 2018-06-01 (×4): 1000 [IU] via ORAL
  Filled 2018-05-29 (×4): qty 1

## 2018-05-29 MED ORDER — DIAZEPAM 5 MG PO TABS
10.0000 mg | ORAL_TABLET | Freq: Three times a day (TID) | ORAL | Status: DC | PRN
  Administered 2018-05-30 – 2018-05-31 (×2): 10 mg via ORAL
  Filled 2018-05-29 (×2): qty 2

## 2018-05-29 MED ORDER — HYDROCODONE-ACETAMINOPHEN 5-325 MG PO TABS
1.0000 | ORAL_TABLET | ORAL | Status: DC | PRN
  Administered 2018-05-29 (×2): 1 via ORAL
  Administered 2018-05-30 (×2): 2 via ORAL
  Administered 2018-05-30 (×2): 1 via ORAL
  Administered 2018-05-31 – 2018-06-01 (×7): 2 via ORAL
  Filled 2018-05-29 (×2): qty 2
  Filled 2018-05-29: qty 1
  Filled 2018-05-29 (×3): qty 2
  Filled 2018-05-29: qty 1
  Filled 2018-05-29 (×3): qty 2
  Filled 2018-05-29: qty 1
  Filled 2018-05-29: qty 2
  Filled 2018-05-29: qty 1

## 2018-05-29 MED ORDER — PIPERACILLIN-TAZOBACTAM 3.375 G IVPB
3.3750 g | Freq: Three times a day (TID) | INTRAVENOUS | Status: DC
  Administered 2018-05-29 – 2018-06-01 (×11): 3.375 g via INTRAVENOUS
  Filled 2018-05-29 (×14): qty 50

## 2018-05-29 MED ORDER — ALBUTEROL SULFATE (2.5 MG/3ML) 0.083% IN NEBU
2.5000 mg | INHALATION_SOLUTION | Freq: Four times a day (QID) | RESPIRATORY_TRACT | Status: DC | PRN
  Administered 2018-05-30: 2.5 mg via RESPIRATORY_TRACT
  Filled 2018-05-29: qty 3

## 2018-05-29 MED ORDER — AMPHETAMINE-DEXTROAMPHETAMINE 5 MG PO TABS
30.0000 mg | ORAL_TABLET | Freq: Two times a day (BID) | ORAL | Status: DC
  Administered 2018-05-29 – 2018-06-01 (×8): 30 mg via ORAL
  Filled 2018-05-29 (×8): qty 6

## 2018-05-29 MED ORDER — GUAIFENESIN-DM 100-10 MG/5ML PO SYRP
5.0000 mL | ORAL_SOLUTION | ORAL | Status: DC | PRN
  Filled 2018-05-29: qty 5

## 2018-05-29 MED ORDER — VITAMIN C 500 MG PO TABS
250.0000 mg | ORAL_TABLET | Freq: Every day | ORAL | Status: DC
  Administered 2018-05-29 – 2018-06-01 (×4): 250 mg via ORAL
  Filled 2018-05-29 (×3): qty 1
  Filled 2018-05-29: qty 0.5

## 2018-05-29 MED ORDER — HEPARIN SODIUM (PORCINE) 5000 UNIT/ML IJ SOLN
5000.0000 [IU] | Freq: Three times a day (TID) | INTRAMUSCULAR | Status: DC
  Administered 2018-05-29 – 2018-06-01 (×11): 5000 [IU] via SUBCUTANEOUS
  Filled 2018-05-29 (×11): qty 1

## 2018-05-29 NOTE — Progress Notes (Signed)
Pharmacy Antibiotic Note  Linda Mejia is a 63 y.o. female admitted on 05/28/2018 with pneumonia.  Pharmacy has been consulted for zosyn dosing.  Plan: Zosyn 3.375g IV q8h (4 hour infusion).  Height: 5\' 5"  (165.1 cm) Weight: 216 lb 14.9 oz (98.4 kg) IBW/kg (Calculated) : 57  Temp (24hrs), Avg:97.8 F (36.6 C), Min:97.6 F (36.4 C), Max:98 F (36.7 C)  Recent Labs  Lab 05/23/18 0518 05/28/18 2127  WBC  --  10.3  CREATININE 0.49 0.82    Estimated Creatinine Clearance: 83.7 mL/min (by C-G formula based on SCr of 0.82 mg/dL).    No Known Allergies  Thank you for allowing pharmacy to be a part of this patient's care.  Tobie Lords, PharmD, BCPS Clinical Pharmacist 05/29/2018

## 2018-05-29 NOTE — ED Notes (Signed)
ED TO INPATIENT HANDOFF REPORT  Name/Age/Gender Linda Mejia Plan 63 y.o. female  Code Status    Code Status Orders  (From admission, onward)         Start     Ordered   05/29/18 0536  Full code  Continuous     05/29/18 0535        Code Status History    Date Active Date Inactive Code Status Order ID Comments User Context   05/16/2018 4259 05/25/2018 1349 Full Code [id number]  Linda Cram, RN Inpatient   05/16/2018 1112 05/16/2018 1324 DNR [id number]  Linda Loll, MD Inpatient   01/23/2018 2319 01/26/2018 1715 Full Code [id number]  Linda Jo, MD Inpatient   08/11/2016 1909 08/12/2016 1840 Full Code [id number]  Linda Grist, MD Inpatient   07/29/2016 1533 07/30/2016 2117 Full Code [id number]  Linda Grayer, MD ED   09/21/2015 0229 09/22/2015 1418 Full Code [id number]  Linda Shelling, MD Inpatient      Home/SNF/Other Home  Chief Complaint SOB  Level of Care/Admitting Diagnosis ED Disposition    ED Disposition Condition Major: [place] [[id number]]  Level of Care: Telemetry [5]  Diagnosis: Hypoxia [151761]  Admitting Physician: Linda Mejia [[id number]]  Attending Physician: Linda Mejia [[id number]]  Estimated length of stay: past midnight tomorrow  Certification:: I certify this patient will need inpatient services for at least 2 midnights  PT Class (Do Not Modify): Inpatient [101]  PT Acc Code (Do Not Modify): Private [1]       Medical History Past Medical History:  Diagnosis Date  . Abnormal ankle brachial index (ABI) 12/02/2016  . ADHD (attention deficit hyperactivity disorder)   . Anxiety   . Aortic atherosclerosis ([place]) 12/02/2016   July 2018  . Chronic back pain   . Coronary artery disease   . Herpes genitalis in women   . History of stomach ulcers    x7 this year. Bleeding  . Hypertension   . Prediabetes 12/02/2016  . Pulmonary hypertension ([place]) 03/09/2018   Echo Oct 2019    Allergies No Known Allergies  IV  Location/Drains/Wounds Patient Lines/Drains/Airways Status   Active Line/Drains/Airways    Name:   Placement date:   Placement time:   Site:   Days:   Peripheral IV 05/28/18 Right Forearm   05/28/18    2216    Forearm   1   Peripheral IV 05/28/18 Right Arm   05/28/18    1143    Arm   1          Labs/Imaging Results for orders placed or performed during the hospital encounter of 05/28/18 (from the past 48 hour(s))  Comprehensive metabolic panel     Status: Abnormal   Collection Time: 05/28/18  9:27 PM  Result Value Ref Range   Sodium 139 135 - 145 mmol/L   Potassium 4.2 3.5 - 5.1 mmol/L    Comment: HEMOLYSIS AT THIS LEVEL MAY AFFECT RESULT   Chloride 106 98 - 111 mmol/L   CO2 27 22 - 32 mmol/L   Glucose, Bld 106 (H) 70 - 99 mg/dL   BUN 22 8 - 23 mg/dL   Creatinine, Ser 0.82 0.44 - 1.00 mg/dL   Calcium 7.8 (L) 8.9 - 10.3 mg/dL   Total Protein 7.3 6.5 - 8.1 g/dL   Albumin 3.3 (L) 3.5 - 5.0 g/dL   AST 27 15 - 41 U/L    Comment: HEMOLYSIS AT THIS LEVEL MAY AFFECT RESULT  ALT 28 0 - 44 U/L   Alkaline Phosphatase 72 38 - 126 U/L   Total Bilirubin 0.9 0.3 - 1.2 mg/dL    Comment: HEMOLYSIS AT THIS LEVEL MAY AFFECT RESULT   GFR calc non Af Amer >60 >60 mL/min   GFR calc Af Amer >60 >60 mL/min   Anion gap 6 5 - 15    Comment: Performed at [facility], [address] [place], [place] [address]  CBC with Differential     Status: Abnormal   Collection Time: 05/28/18  9:27 PM  Result Value Ref Range   WBC 10.3 4.0 - 10.5 K/uL   RBC 3.53 (L) 3.87 - 5.11 MIL/uL   Hemoglobin 10.7 (L) 12.0 - 15.0 g/dL   HCT 34.9 (L) 36.0 - 46.0 %   MCV 98.9 80.0 - 100.0 fL   MCH 30.3 26.0 - 34.0 pg   MCHC 30.7 30.0 - 36.0 g/dL   RDW 13.8 11.5 - 15.5 %   Platelets 789 (H) 150 - 400 K/uL   nRBC 0.0 0.0 - 0.2 %   Neutrophils Relative % 56 %   Neutro Abs 5.9 1.7 - 7.7 K/uL   Lymphocytes Relative 31 %   Lymphs Abs 3.2 0.7 - 4.0 K/uL   Monocytes Relative 9 %   Monocytes Absolute 0.9 0.1 -  1.0 K/uL   Eosinophils Relative 1 %   Eosinophils Absolute 0.1 0.0 - 0.5 K/uL   Basophils Relative 1 %   Basophils Absolute 0.1 0.0 - 0.1 K/uL   Immature Granulocytes 2 %   Abs Immature Granulocytes 0.15 (H) 0.00 - 0.07 K/uL    Comment: Performed at [facility], [address] [place], [place] [address]  Lipase, blood     Status: Abnormal   Collection Time: 05/28/18  9:27 PM  Result Value Ref Range   Lipase 90 (H) 11 - 51 U/L    Comment: Performed at [facility], [address] [place], [place] [address]  Influenza panel by PCR (type A & B)     Status: None   Collection Time: 05/28/18  9:27 PM  Result Value Ref Range   Influenza A By PCR NEGATIVE NEGATIVE   Influenza B By PCR NEGATIVE NEGATIVE    Comment: (NOTE) The Xpert Xpress Flu assay is intended as an aid in the diagnosis of  influenza and should not be used as a sole basis for treatment.  This  assay is FDA approved for nasopharyngeal swab specimens only. Nasal  washings and aspirates are unacceptable for Xpert Xpress Flu testing. Performed at [facility], [address] [place], [place] [address]   Troponin I - Add-On to previous collection     Status: Abnormal   Collection Time: 05/28/18  9:27 PM  Result Value Ref Range   Troponin I 0.06 (HH) <0.03 ng/mL    Comment: CRITICAL RESULT CALLED TO, READ BACK BY AND VERIFIED WITH GEORGIE HAHLE AT 0126 ON 05/29/2018 [place]. Performed at [facility], [address] [place], [place] [address]   CBC     Status: Abnormal   Collection Time: 05/29/18  7:24 AM  Result Value Ref Range   WBC 9.6 4.0 - 10.5 K/uL   RBC 3.36 (L) 3.87 - 5.11 MIL/uL   Hemoglobin 10.3 (L) 12.0 - 15.0 g/dL   HCT 33.3 (L) 36.0 - 46.0 %   MCV 99.1 80.0 - 100.0 fL   MCH 30.7 26.0 - 34.0 pg   MCHC 30.9  30.0 - 36.0 g/dL   RDW 13.9 11.5 - 15.5 %   Platelets 695 (H) 150 - 400 K/uL   nRBC 0.0 0.0 - 0.2 %    Comment: Performed at [facility], [address] [place], [place] [address]  Brain natriuretic peptide     Status: Abnormal   Collection Time: 05/29/18  7:24 AM  Result Value Ref Range   B Natriuretic Peptide 723.0 (H) 0.0 - 100.0 pg/mL    Comment: Performed at [facility], [address] [place], [place] [address]  Comprehensive metabolic panel     Status: Abnormal   Collection Time: 05/29/18  7:24 AM  Result Value Ref Range   Sodium 139 135 - 145 mmol/L   Potassium 3.8 3.5 - 5.1 mmol/L   Chloride 105 98 - 111 mmol/L   CO2 27 22 - 32 mmol/L   Glucose, Bld 103 (H) 70 - 99 mg/dL   BUN 16 8 - 23 mg/dL   Creatinine, Ser 0.79 0.44 - 1.00 mg/dL   Calcium 7.8 (L) 8.9 - 10.3 mg/dL   Total Protein 7.0 6.5 - 8.1 g/dL   Albumin 3.1 (L) 3.5 - 5.0 g/dL   AST 19 15 - 41 U/L   ALT 26 0 - 44 U/L   Alkaline Phosphatase 67 38 - 126 U/L   Total Bilirubin 0.6 0.3 - 1.2 mg/dL   GFR calc non Af Amer >60 >60 mL/min   GFR calc Af Amer >60 >60 mL/min   Anion gap 7 5 - 15    Comment: Performed at [facility], [address] [place], [place] [address]  Troponin I - Now Then Q6H     Status: Abnormal   Collection Time: 05/29/18  7:24 AM  Result Value Ref Range   Troponin I 0.07 (HH) <0.03 ng/mL    Comment: CRITICAL RESULT CALLED TO, READ BACK BY AND VERIFIED WITH [facility] AT 7628 05/29/2018 [place] Performed at [facility], [address] [place], [place] [address]   Lipid panel     Status: Abnormal   Collection Time: 05/29/18  7:24 AM  Result Value Ref Range   Cholesterol 194 0 - 200 mg/dL   Triglycerides 203 (H) <150 mg/dL   HDL 34 (L) >40 mg/dL   Total CHOL/HDL Ratio 5.7 RATIO   VLDL 41 (H) 0 - 40 mg/dL   LDL Cholesterol 119 (H) 0 - 99 mg/dL    Comment:        Total Cholesterol/HDL:CHD Risk Coronary Heart Disease Risk Table                     Men   Women  1/2 Average Risk   3.4   3.3  Average Risk       5.0   4.4  2 X Average Risk   9.6   7.1  3 X Average Risk  23.4   11.0        Use the  calculated Patient Ratio above and the CHD Risk Table to determine the patient's CHD Risk.        ATP III CLASSIFICATION (LDL):  <100     mg/dL   Optimal  100-129  mg/dL   Near or Above                    Optimal  130-159  mg/dL   Borderline  160-189  mg/dL   High  >190  mg/dL   Very High Performed at [facility], [address] [place], [place] [address]   Glucose, capillary     Status: None   Collection Time: 05/29/18  7:56 AM  Result Value Ref Range   Glucose-Capillary 90 70 - 99 mg/dL  Troponin I - Now Then Q6H     Status: Abnormal   Collection Time: 05/29/18 11:41 AM  Result Value Ref Range   Troponin I 0.06 (HH) <0.03 ng/mL    Comment: CRITICAL VALUE NOTED. VALUE IS CONSISTENT WITH PREVIOUSLY REPORTED/CALLED VALUE / [facility] Performed at [facility], [address] [place], [place] [address]    Dg Chest 2 View  Result Date: 05/29/2018 CLINICAL DATA:  Airspace opacity EXAM: CHEST - 2 VIEW COMPARISON:  Chest radiograph May 20, 2018 and chest CT February 14,2020 FINDINGS: There are pleural effusions bilaterally, better appreciated on recent CT. Patchy airspace consolidation is seen in the right upper lobe as well as in the right lower lobe. There is atelectatic change in the bases. Heart is mildly enlarged with pulmonary vascularity normal. No adenopathy. There is slight anterior wedging of the L1 vertebral body. IMPRESSION: Patchy airspace consolidation in portions of the right upper and lower lobes. Pleural effusions bilaterally. No new opacity. Stable cardiac prominence. Note that the retroareolar right breast mass seen on CT is not appreciable by radiography. Please see recommendations stated in chest CT report from 1 day prior. Aortic Atherosclerosis (ICD10-I70.0). Electronically Signed   By: Lowella Grip III M.D.   On: 05/29/2018 08:35   Ct Angio Chest Pe W And/or Wo Contrast  Result Date: 05/29/2018 CLINICAL DATA:  Pancreatitis and abdomen pain.  EXAM: CT ANGIOGRAPHY CHEST CT ABDOMEN AND PELVIS WITH CONTRAST TECHNIQUE: Multidetector CT imaging of the chest was performed using the standard protocol during bolus administration of intravenous contrast. Multiplanar CT image reconstructions and MIPs were obtained to evaluate the vascular anatomy. Multidetector CT imaging of the abdomen and pelvis was performed using the standard protocol during bolus administration of intravenous contrast. CONTRAST:  51mL OMNIPAQUE IOHEXOL 350 MG/ML SOLN COMPARISON:  CT abdomen pelvis April 09, 2018 FINDINGS: CTA CHEST FINDINGS Cardiovascular: Satisfactory opacification of the pulmonary arteries to the segmental level. No evidence of pulmonary embolism. Normal heart size. No pericardial effusion. Mediastinum/Nodes: Mild prominent lymph nodes identified in the mediastinum largest measures 1.4 cm in short axis. No hilar lymphadenopathy is identified. The trachea is normal. The esophagus is unremarkable. There is question 5 cm mass in the retroareolar right breast. Lungs/Pleura: Patchy consolidation is identified throughout right upper lobe, right middle lobe and to a lesser degree in the right lower lobe, inferior left upper lobe. There are bilateral pleural effusions, moderate right, small left. Musculoskeletal: Degenerative joint changes of the spine are identified. Review of the MIP images confirms the above findings. CT ABDOMEN and PELVIS FINDINGS Hepatobiliary: No focal liver lesion is identified. Minimal fluid is identified surrounding the gallbladder. The gallbladder is otherwise normal. Biliary tree is normal. Pancreas: Unremarkable. No pancreatic ductal dilatation or surrounding inflammatory changes. Spleen: Normal in size without focal abnormality. Adrenals/Urinary Tract: Bilateral adrenal glands are normal. Left renal vascular calcification is noted. No kidney stones are identified bilaterally. There is no hydronephrosis bilaterally. The bladder is normal.  Stomach/Bowel: Stomach is within normal limits. Patient status post prior appendectomy. No evidence of bowel wall thickening, distention, or inflammatory changes. Vascular/Lymphatic: Aortic atherosclerosis. No enlarged abdominal or pelvic lymph nodes. Reproductive: Uterus and bilateral adnexa are unremarkable. Other: None. Musculoskeletal: Degenerative  joint changes of the spine are noted. Review of the MIP images confirms the above findings. IMPRESSION: No pulmonary embolus. Patchy consolidation in both lung, nonspecific multifocal pneumonia is not excluded. Moderate right pleural effusion and small left pleural effusion. Mild enlarged mediastinal lymph nodes, which may be reactive lymph nodes. Question 5 cm mass in the retroareolar right breast. Further evaluation with diagnostic mammogram and possible ultrasound are recommended. No acute abnormality identified in the abdomen and pelvis. Minimal fluid identified around gallbladder, nonspecific. Electronically Signed   By: Abelardo Diesel M.D.   On: 05/29/2018 00:20   Ct Abdomen Pelvis W Contrast  Result Date: 05/29/2018 CLINICAL DATA:  Pancreatitis and abdomen pain. EXAM: CT ANGIOGRAPHY CHEST CT ABDOMEN AND PELVIS WITH CONTRAST TECHNIQUE: Multidetector CT imaging of the chest was performed using the standard protocol during bolus administration of intravenous contrast. Multiplanar CT image reconstructions and MIPs were obtained to evaluate the vascular anatomy. Multidetector CT imaging of the abdomen and pelvis was performed using the standard protocol during bolus administration of intravenous contrast. CONTRAST:  14mL OMNIPAQUE IOHEXOL 350 MG/ML SOLN COMPARISON:  CT abdomen pelvis April 09, 2018 FINDINGS: CTA CHEST FINDINGS Cardiovascular: Satisfactory opacification of the pulmonary arteries to the segmental level. No evidence of pulmonary embolism. Normal heart size. No pericardial effusion. Mediastinum/Nodes: Mild prominent lymph nodes identified in the  mediastinum largest measures 1.4 cm in short axis. No hilar lymphadenopathy is identified. The trachea is normal. The esophagus is unremarkable. There is question 5 cm mass in the retroareolar right breast. Lungs/Pleura: Patchy consolidation is identified throughout right upper lobe, right middle lobe and to a lesser degree in the right lower lobe, inferior left upper lobe. There are bilateral pleural effusions, moderate right, small left. Musculoskeletal: Degenerative joint changes of the spine are identified. Review of the MIP images confirms the above findings. CT ABDOMEN and PELVIS FINDINGS Hepatobiliary: No focal liver lesion is identified. Minimal fluid is identified surrounding the gallbladder. The gallbladder is otherwise normal. Biliary tree is normal. Pancreas: Unremarkable. No pancreatic ductal dilatation or surrounding inflammatory changes. Spleen: Normal in size without focal abnormality. Adrenals/Urinary Tract: Bilateral adrenal glands are normal. Left renal vascular calcification is noted. No kidney stones are identified bilaterally. There is no hydronephrosis bilaterally. The bladder is normal. Stomach/Bowel: Stomach is within normal limits. Patient status post prior appendectomy. No evidence of bowel wall thickening, distention, or inflammatory changes. Vascular/Lymphatic: Aortic atherosclerosis. No enlarged abdominal or pelvic lymph nodes. Reproductive: Uterus and bilateral adnexa are unremarkable. Other: None. Musculoskeletal: Degenerative joint changes of the spine are noted. Review of the MIP images confirms the above findings. IMPRESSION: No pulmonary embolus. Patchy consolidation in both lung, nonspecific multifocal pneumonia is not excluded. Moderate right pleural effusion and small left pleural effusion. Mild enlarged mediastinal lymph nodes, which may be reactive lymph nodes. Question 5 cm mass in the retroareolar right breast. Further evaluation with diagnostic mammogram and possible  ultrasound are recommended. No acute abnormality identified in the abdomen and pelvis. Minimal fluid identified around gallbladder, nonspecific. Electronically Signed   By: Abelardo Diesel M.D.   On: 05/29/2018 00:20    Pending Labs Unresulted Labs (From admission, onward)    Start     Ordered   05/29/18 0536  Troponin I - Now Then Q6H  Now then every 6 hours,   STAT     05/29/18 0535          Vitals/Pain Today's Vitals   05/29/18 1100 05/29/18 1503 05/29/18 1508 05/29/18 1538  BP: 112/78  107/67   Pulse: 68  83   Resp: 12  18   Temp:   98.1 F (36.7 C)   TempSrc:   Oral   SpO2: 92%  95%   Weight:      Height:      PainSc:  7   7     Isolation Precautions Droplet precaution  Medications Medications  aspirin EC tablet 81 mg (81 mg Oral Given 05/29/18 0917)  acyclovir (ZOVIRAX) 200 MG capsule 400 mg (400 mg Oral Given 05/29/18 0918)  metoprolol tartrate (LOPRESSOR) tablet 50 mg (50 mg Oral Given 05/29/18 0918)  nitroGLYCERIN (NITROSTAT) SL tablet 0.4 mg (has no administration in time range)  amphetamine-dextroamphetamine (ADDERALL) tablet 30 mg (30 mg Oral Given 05/29/18 1452)  diazepam (VALIUM) tablet 10 mg (has no administration in time range)  pantoprazole (PROTONIX) EC tablet 40 mg (40 mg Oral Given 05/29/18 1617)  vitamin C (ASCORBIC ACID) tablet 250 mg (250 mg Oral Given 05/29/18 0917)  cholecalciferol (VITAMIN D3) tablet 1,000 Units (1,000 Units Oral Given 05/29/18 0917)  fluticasone (FLONASE) 50 MCG/ACT nasal spray 2 spray (has no administration in time range)  guaiFENesin-dextromethorphan (ROBITUSSIN DM) 100-10 MG/5ML syrup 5 mL (has no administration in time range)  heparin injection 5,000 Units (5,000 Units Subcutaneous Given 05/29/18 1455)  acetaminophen (TYLENOL) tablet 650 mg (650 mg Oral Given 05/29/18 1452)    Or  acetaminophen (TYLENOL) suppository 650 mg ( Rectal See Alternative 05/29/18 1452)  HYDROcodone-acetaminophen (NORCO/VICODIN) 5-325 MG per tablet 1-2  tablet (1 tablet Oral Given 05/29/18 1538)  traZODone (DESYREL) tablet 25 mg (has no administration in time range)  docusate sodium (COLACE) capsule 100 mg (100 mg Oral Given 05/29/18 0918)  bisacodyl (DULCOLAX) EC tablet 5 mg (has no administration in time range)  ondansetron (ZOFRAN) tablet 4 mg (has no administration in time range)    Or  ondansetron (ZOFRAN) injection 4 mg (has no administration in time range)  albuterol (PROVENTIL) (2.5 MG/3ML) 0.083% nebulizer solution 2.5 mg (has no administration in time range)  ipratropium (ATROVENT) nebulizer solution 0.5 mg (has no administration in time range)  piperacillin-tazobactam (ZOSYN) IVPB 3.375 g (3.375 g Intravenous New Bag/Given 05/29/18 1501)  albuterol (PROVENTIL) (2.5 MG/3ML) 0.083% nebulizer solution 5 mg (5 mg Nebulization Given 05/29/18 0022)  sodium chloride 0.9 % bolus 1,000 mL (0 mLs Intravenous Stopped 05/29/18 0025)  iohexol (OMNIPAQUE) 350 MG/ML injection 75 mL (75 mLs Intravenous Contrast Given 05/28/18 2235)  ketorolac (TORADOL) 30 MG/ML injection 15 mg (15 mg Intravenous Given 05/28/18 2355)  ondansetron (ZOFRAN) injection 4 mg (4 mg Intravenous Given 05/29/18 0034)  morphine 4 MG/ML injection 4 mg (4 mg Intravenous Given 05/29/18 0035)  morphine 4 MG/ML injection 4 mg (4 mg Intravenous Given 05/29/18 0325)  furosemide (LASIX) injection 20 mg (20 mg Intravenous Given 05/29/18 0325)    Mobility With 1 assist

## 2018-05-29 NOTE — ED Notes (Signed)
EDP Karma Greaser verb to hold on 2nd trop.

## 2018-05-29 NOTE — Progress Notes (Signed)
Advanced Care Plan.  Purpose of Encounter: CODE STATUS. Parties in Attendance: The patient, RN and me. Patient's Decisional Capacity: Yes. Medical Story: Linda Mejia  is a 63 y.o. female with a known history of ADHD, aortic atherosclerosis, hypertension, pulmonary hypertension, CAD, chronic back pain, UTI and prediabetes. The patient is being admitted for acute respiratory failure with hypoxia due to pneumonia and pleural effusion.   I discussed with the patient about her current condition, prognosis and the CODE STATUS. She wants to be resuscitated and intubated if she has cardiopulmonary arrest.  Plan:  Code Status: Full code. Time spent discussing advance care planning: 17 minutes.

## 2018-05-29 NOTE — ED Notes (Signed)
Floor unable to take report at this time.

## 2018-05-29 NOTE — H&P (Signed)
[facility] [place] NAME: Linda Mejia    MR#:  [id number]  DATE OF BIRTH:  Oct 23, 1955  DATE OF ADMISSION:  05/28/2018  PRIMARY CARE PHYSICIAN: Arnetha Courser, MD   REQUESTING/REFERRING PHYSICIAN: Dr. Joni Fears  CHIEF COMPLAINT:   Chief Complaint  Patient presents with  . Shortness of Breath    HISTORY OF PRESENT ILLNESS:  Linda Mejia  is a 63 y.o. female with a known history listed below presented to emergency room for evaluation of shortness of breath and tiredness.  Patient was recently admitted for pneumonia.  Patient was discharged home 3 days ago.  Patient was okay but feeling tired.  Patient is complaining of generalized weakness and fatigue.  Yesterday patient was short of breath.  Not feeling very well.  She decided to come to ER for further evaluation.  In emergency room patient is hypoxic on room air.  Patient started on oxygen.  CT scan done that showed worsening of pleural effusion.  Patient also has mild elevation in troponin.  Hospitalist team requested for admission.  PAST MEDICAL HISTORY:   Past Medical History:  Diagnosis Date  . Abnormal ankle brachial index (ABI) 12/02/2016  . ADHD (attention deficit hyperactivity disorder)   . Anxiety   . Aortic atherosclerosis ([place]) 12/02/2016   July 2018  . Chronic back pain   . Coronary artery disease   . Herpes genitalis in women   . History of stomach ulcers    x7 this year. Bleeding  . Hypertension   . Prediabetes 12/02/2016  . Pulmonary hypertension ([place]) 03/09/2018   Echo Oct 2019    PAST SURGICAL HISTORY:   Past Surgical History:  Procedure Laterality Date  . APPENDECTOMY    . CARDIAC CATHETERIZATION    . CESAREAN SECTION    . COLON SURGERY     interception  . ESOPHAGOGASTRODUODENOSCOPY (EGD) WITH PROPOFOL N/A 09/21/2015   Procedure: ESOPHAGOGASTRODUODENOSCOPY (EGD) WITH PROPOFOL;  Surgeon: Lucilla Lame, MD;  Location: [facility] ENDOSCOPY;  Service: Endoscopy;  Laterality:  N/A;  . ESOPHAGOGASTRODUODENOSCOPY (EGD) WITH PROPOFOL N/A 07/30/2016   Procedure: ESOPHAGOGASTRODUODENOSCOPY (EGD) WITH PROPOFOL;  Surgeon: Jonathon Bellows, MD;  Location: [facility] ENDOSCOPY;  Service: Endoscopy;  Laterality: N/A;  . ESOPHAGOGASTRODUODENOSCOPY (EGD) WITH PROPOFOL N/A 04/23/2018   Procedure: ESOPHAGOGASTRODUODENOSCOPY (EGD) WITH PROPOFOL;  Surgeon: Virgel Manifold, MD;  Location: [facility] ENDOSCOPY;  Service: Endoscopy;  Laterality: N/A;  . LEFT HEART CATH Right 01/25/2018   Procedure: Left Heart Cath and Coronary Angiography;  Surgeon: Dionisio David, MD;  Location: [facility];  Service: Cardiovascular;  Laterality: Right;  . LEFT HEART CATH AND CORONARY ANGIOGRAPHY Right 08/12/2016   Procedure: Left Heart Cath and Coronary Angiography;  Surgeon: Dionisio David, MD;  Location: [facility];  Service: Cardiovascular;  Laterality: Right;  . TONSILLECTOMY    . TUBAL LIGATION      SOCIAL HISTORY:   Social History   Tobacco Use  . Smoking status: Former Smoker    Packs/day: 1.00    Years: 30.00    Pack years: 30.00    Types: Cigarettes    Last attempt to quit: 2006    Years since quitting: 14.1  . Smokeless tobacco: Never Used  . Tobacco comment: smoking cessation materials not required  Substance Use Topics  . Alcohol use: Not Currently    Alcohol/week: 0.0 standard drinks    Frequency: Never    FAMILY HISTORY:   Family History  Problem  Relation Age of Onset  . Diabetes Mother   . CVA Mother   . Heart disease Father   . Heart disease Sister   . Heart disease Brother   . Heart disease Sister   . Heart disease Sister   . Heart disease Sister   . Heart disease Brother   . Heart disease Brother   . Heart disease Brother     DRUG ALLERGIES:  No Known Allergies  REVIEW OF SYSTEMS:   ROS  -12 point review of system reviewed positive as per HPI otherwise negative.  MEDICATIONS AT HOME:   Prior to Admission medications   Medication Sig Start  Date End Date Taking? Authorizing Provider  acyclovir (ZOVIRAX) 400 MG tablet take 1 tablet by mouth twice a day Patient taking differently: Take 400 mg by mouth 2 (two) times daily.  11/13/17  Yes Lada, Satira Anis, MD  amLODipine (NORVASC) 5 MG tablet TAKE 1 TABLET(5 MG) BY MOUTH DAILY FOR BLOOD PRESSURE Patient taking differently: Take 5 mg by mouth daily.  04/03/18  Yes Lada, Satira Anis, MD  amphetamine-dextroamphetamine (ADDERALL) 30 MG tablet Take 30 mg by mouth 2 (two) times daily.  12/02/16  Yes Lada, Satira Anis, MD  Ascorbic Acid (VITAMIN C) 100 MG tablet Take 100 mg by mouth daily.   Yes [provider]  aspirin EC 81 MG tablet Take 81 mg by mouth daily.   Yes [provider]  cefdinir (OMNICEF) 300 MG capsule Take 1 capsule (300 mg total) by mouth every 12 (twelve) hours for 5 days. 05/25/18 05/30/18 Yes Dustin Flock, MD  cholecalciferol (VITAMIN D) 1000 units tablet Take 1,000 Units by mouth daily.   Yes [provider]  diazepam (VALIUM) 10 MG tablet Take 1 tablet (10 mg total) by mouth every 8 (eight) hours as needed for anxiety. 09/22/15  Yes Fritzi Mandes, MD  fluticasone (FLONASE) 50 MCG/ACT nasal spray Place 2 sprays into both nostrils as needed. Patient taking differently: Place 2 sprays into both nostrils daily as needed for allergies.  09/22/17  Yes Lada, Satira Anis, MD  guaiFENesin-dextromethorphan (ROBITUSSIN DM) 100-10 MG/5ML syrup Take 5 mLs by mouth every 4 (four) hours as needed for cough. 05/25/18  Yes Dustin Flock, MD  metoprolol tartrate (LOPRESSOR) 50 MG tablet TAKE 1 TABLET BY MOUTH TWICE A DAY 03/15/18  Yes Lada, Satira Anis, MD  nystatin (MYCOSTATIN/NYSTOP) powder Apply topically 2 (two) times daily. 05/25/18  Yes Dustin Flock, MD  Omega-3 Fatty Acids (FISH OIL) 1000 MG CAPS Take 1 capsule by mouth daily.   Yes [provider]  PRALUENT 75 MG/ML SOPN Inject 75 mLs as directed every 14 (fourteen) days. 11/25/16  Yes [provider]    nitroGLYCERIN (NITROSTAT) 0.4 MG SL tablet Place 1 tablet (0.4 mg total) under the tongue every 5 (five) minutes as needed for chest pain. 01/26/18   Bettey Costa, MD  pantoprazole (PROTONIX) 40 MG tablet Take 1 tablet (40 mg total) by mouth 2 (two) times daily before a meal for 30 days. 04/23/18 05/23/18  Virgel Manifold, MD      VITAL SIGNS:  Blood pressure 118/79, pulse (!) 101, temperature 97.6 F (36.4 C), temperature source Oral, resp. rate (!) 23, height 5\' 5"  (1.651 m), weight 98.4 kg, SpO2 100 %.  PHYSICAL EXAMINATION:  Physical Exam  GENERAL:  63 y.o.-year-old patient lying in the bed with mild distress.  Anxious EYES: Pupils equal, round, reactive to light and accommodation. No scleral icterus. Extraocular muscles intact.  HEENT: Head atraumatic, normocephalic. Oropharynx and nasopharynx clear.  NECK:  Supple, no jugular venous distention. No thyroid enlargement, no tenderness.  LUNGS: Bilateral decreased on bases, few crackles heard Breast: No palpable masses.  Patient's daughter present during examination CARDIOVASCULAR: S1, S2 normal. No murmurs, rubs, or gallops.  ABDOMEN: Soft, nontender, nondistended. Bowel sounds present. No organomegaly or mass.  EXTREMITIES: No pedal edema, cyanosis, or clubbing.  NEUROLOGIC: Cranial nerves II through XII are intact. Muscle strength 5/5 in all extremities. Sensation intact. Gait not checked.  PSYCHIATRIC: The patient is alert and oriented x 3.  SKIN: No obvious rash, lesion, or ulcer.   LABORATORY PANEL:   CBC Recent Labs  Lab 05/28/18 2127  WBC 10.3  HGB 10.7*  HCT 34.9*  PLT 789*   ------------------------------------------------------------------------------------------------------------------  Chemistries  Recent Labs  Lab 05/28/18 2127  NA 139  K 4.2  CL 106  CO2 27  GLUCOSE 106*  BUN 22  CREATININE 0.82  CALCIUM 7.8*  AST 27  ALT 28  ALKPHOS 72  BILITOT 0.9    ------------------------------------------------------------------------------------------------------------------  Cardiac Enzymes Recent Labs  Lab 05/28/18 2127  TROPONINI 0.06*   ------------------------------------------------------------------------------------------------------------------  RADIOLOGY:  Ct Angio Chest Pe W And/or Wo Contrast  Result Date: 05/29/2018 CLINICAL DATA:  Pancreatitis and abdomen pain. EXAM: CT ANGIOGRAPHY CHEST CT ABDOMEN AND PELVIS WITH CONTRAST TECHNIQUE: Multidetector CT imaging of the chest was performed using the standard protocol during bolus administration of intravenous contrast. Multiplanar CT image reconstructions and MIPs were obtained to evaluate the vascular anatomy. Multidetector CT imaging of the abdomen and pelvis was performed using the standard protocol during bolus administration of intravenous contrast. CONTRAST:  34mL OMNIPAQUE IOHEXOL 350 MG/ML SOLN COMPARISON:  CT abdomen pelvis April 09, 2018 FINDINGS: CTA CHEST FINDINGS Cardiovascular: Satisfactory opacification of the pulmonary arteries to the segmental level. No evidence of pulmonary embolism. Normal heart size. No pericardial effusion. Mediastinum/Nodes: Mild prominent lymph nodes identified in the mediastinum largest measures 1.4 cm in short axis. No hilar lymphadenopathy is identified. The trachea is normal. The esophagus is unremarkable. There is question 5 cm mass in the retroareolar right breast. Lungs/Pleura: Patchy consolidation is identified throughout right upper lobe, right middle lobe and to a lesser degree in the right lower lobe, inferior left upper lobe. There are bilateral pleural effusions, moderate right, small left. Musculoskeletal: Degenerative joint changes of the spine are identified. Review of the MIP images confirms the above findings. CT ABDOMEN and PELVIS FINDINGS Hepatobiliary: No focal liver lesion is identified. Minimal fluid is identified surrounding the  gallbladder. The gallbladder is otherwise normal. Biliary tree is normal. Pancreas: Unremarkable. No pancreatic ductal dilatation or surrounding inflammatory changes. Spleen: Normal in size without focal abnormality. Adrenals/Urinary Tract: Bilateral adrenal glands are normal. Left renal vascular calcification is noted. No kidney stones are identified bilaterally. There is no hydronephrosis bilaterally. The bladder is normal. Stomach/Bowel: Stomach is within normal limits. Patient status post prior appendectomy. No evidence of bowel wall thickening, distention, or inflammatory changes. Vascular/Lymphatic: Aortic atherosclerosis. No enlarged abdominal or pelvic lymph nodes. Reproductive: Uterus and bilateral adnexa are unremarkable. Other: None. Musculoskeletal: Degenerative joint changes of the spine are noted. Review of the MIP images confirms the above findings. IMPRESSION: No pulmonary embolus. Patchy consolidation in both lung, nonspecific multifocal pneumonia is not excluded. Moderate right pleural effusion and small left pleural effusion. Mild enlarged mediastinal lymph nodes, which may be reactive lymph nodes. Question 5 cm mass in the retroareolar right breast. Further evaluation with diagnostic mammogram and  possible ultrasound are recommended. No acute abnormality identified in the abdomen and pelvis. Minimal fluid identified around gallbladder, nonspecific. Electronically Signed   By: Abelardo Diesel M.D.   On: 05/29/2018 00:20   Ct Abdomen Pelvis W Contrast  Result Date: 05/29/2018 CLINICAL DATA:  Pancreatitis and abdomen pain. EXAM: CT ANGIOGRAPHY CHEST CT ABDOMEN AND PELVIS WITH CONTRAST TECHNIQUE: Multidetector CT imaging of the chest was performed using the standard protocol during bolus administration of intravenous contrast. Multiplanar CT image reconstructions and MIPs were obtained to evaluate the vascular anatomy. Multidetector CT imaging of the abdomen and pelvis was performed using the  standard protocol during bolus administration of intravenous contrast. CONTRAST:  56mL OMNIPAQUE IOHEXOL 350 MG/ML SOLN COMPARISON:  CT abdomen pelvis April 09, 2018 FINDINGS: CTA CHEST FINDINGS Cardiovascular: Satisfactory opacification of the pulmonary arteries to the segmental level. No evidence of pulmonary embolism. Normal heart size. No pericardial effusion. Mediastinum/Nodes: Mild prominent lymph nodes identified in the mediastinum largest measures 1.4 cm in short axis. No hilar lymphadenopathy is identified. The trachea is normal. The esophagus is unremarkable. There is question 5 cm mass in the retroareolar right breast. Lungs/Pleura: Patchy consolidation is identified throughout right upper lobe, right middle lobe and to a lesser degree in the right lower lobe, inferior left upper lobe. There are bilateral pleural effusions, moderate right, small left. Musculoskeletal: Degenerative joint changes of the spine are identified. Review of the MIP images confirms the above findings. CT ABDOMEN and PELVIS FINDINGS Hepatobiliary: No focal liver lesion is identified. Minimal fluid is identified surrounding the gallbladder. The gallbladder is otherwise normal. Biliary tree is normal. Pancreas: Unremarkable. No pancreatic ductal dilatation or surrounding inflammatory changes. Spleen: Normal in size without focal abnormality. Adrenals/Urinary Tract: Bilateral adrenal glands are normal. Left renal vascular calcification is noted. No kidney stones are identified bilaterally. There is no hydronephrosis bilaterally. The bladder is normal. Stomach/Bowel: Stomach is within normal limits. Patient status post prior appendectomy. No evidence of bowel wall thickening, distention, or inflammatory changes. Vascular/Lymphatic: Aortic atherosclerosis. No enlarged abdominal or pelvic lymph nodes. Reproductive: Uterus and bilateral adnexa are unremarkable. Other: None. Musculoskeletal: Degenerative joint changes of the spine are  noted. Review of the MIP images confirms the above findings. IMPRESSION: No pulmonary embolus. Patchy consolidation in both lung, nonspecific multifocal pneumonia is not excluded. Moderate right pleural effusion and small left pleural effusion. Mild enlarged mediastinal lymph nodes, which may be reactive lymph nodes. Question 5 cm mass in the retroareolar right breast. Further evaluation with diagnostic mammogram and possible ultrasound are recommended. No acute abnormality identified in the abdomen and pelvis. Minimal fluid identified around gallbladder, nonspecific. Electronically Signed   By: Abelardo Diesel M.D.   On: 05/29/2018 00:20      IMPRESSION AND PLAN:   1.  Shortness of breath and hypoxia: Likely related to pneumonia.  Patient also has worsening pleural effusion.  Possible infected pleural effusion versus CHF.  Patient started on oxygen.  1 dose of Lasix.  Repeat chest x-ray in a.m.  Will obtain echocardiogram.  Patient also started on IV Zosyn.  Symptomatic treatment is indicated.  CTA as above.  If patient has pleural effusion after Lasix then she may need thoracentesis.  2.  Positive troponin: Likely related to hypoxia.  Possibility of CHF.  Cycle cardiac enzymes.  Monitor on telemetry.  Cardiology consult requested.  Follow-up recommendation.  Will obtain echocardiogram.  Check proBNP.  3.  Questionable breast mass: No mass palpated on exam.  Patient's daughter present during examination.  Ultrasound breast ordered for further evaluation.  4.  Generalized weakness: Supportive care and physical therapy evaluation in a.m.   5.  Chronic other medical problems: Monitor.  Continue home medication as ordered  DVT prophylaxis: Heparin subcutaneous  Estimated length of stay more than 2 midnights   All the records are reviewed and case discussed with ED provider. Management plans discussed with the patient, family and they are in agreement.  CODE STATUS: Full  TOTAL TIME TAKING CARE  OF THIS PATIENT: 39 minutes.    [name] M.D on 05/29/2018 at 5:36 AM  Between 7am to 6pm - Pager - [phone]  After 6pm go to [email or web address] - Proofreader  [facility]  Office  [phone]  CC: Primary care physician; Arnetha Courser, MD

## 2018-05-29 NOTE — ED Notes (Signed)
EDP notified of Trop 0.06.

## 2018-05-29 NOTE — ED Notes (Signed)
Pt given sip of ice water.

## 2018-05-29 NOTE — ED Notes (Signed)
Patient transported to X-ray 

## 2018-05-29 NOTE — ED Notes (Signed)
Pt placed on non-rebreather as she keeps desat to low 80s with [place] at 4L. Now at 97%.

## 2018-05-29 NOTE — Consult Note (Signed)
[facility] Consultation Note  Patient ID: Linda Mejia, MRN: [id number], DOB/AGE: 62-Oct-1957 62 y.o. Admit date: 05/28/2018   Date of Consult: 05/29/2018 Primary Physician: Arnetha Courser, MD Primary Cardiologist: [facility]  Chief Complaint:  Chief Complaint  Patient presents with  . Shortness of Breath   Reason for Consult: Shortness of breath  HPI: 63 y.o. female with known coronary artery disease status post previous myocardial infarction and cardiac catheterization in October 2019.  At that time the patient had 100% stenosis of her right coronary artery with good collateralization from the left coronary artery system.  She had a 50% obtuse marginal 1 and the left anterior descending artery stenosis.  The patient has done well with appropriate medication management.  Currently the patient has had a recent hospitalization for pneumonia.  At that time there was no evidence of myocardial infarction or other EKG changes.  She was discharged home and then felt worse with shortness of breath and was coming in again with shortness of breath and weakness.  There was no evidence of chest pain or congestive heart failure.  She does have a troponin of 0.  07 but no evidence of acute coronary syndrome.  Currently she feels much better with oxygenation and has no symptoms consistent with her previous myocardial infarction or angina.  Past Medical History:  Diagnosis Date  . Abnormal ankle brachial index (ABI) 12/02/2016  . ADHD (attention deficit hyperactivity disorder)   . Anxiety   . Aortic atherosclerosis ([place]) 12/02/2016   July 2018  . Chronic back pain   . Coronary artery disease   . Herpes genitalis in women   . History of stomach ulcers    x7 this year. Bleeding  . Hypertension   . Prediabetes 12/02/2016  . Pulmonary hypertension ([place]) 03/09/2018   Echo Oct 2019      Surgical History:  Past Surgical History:  Procedure Laterality Date  . APPENDECTOMY    . CARDIAC  CATHETERIZATION    . CESAREAN SECTION    . COLON SURGERY     interception  . ESOPHAGOGASTRODUODENOSCOPY (EGD) WITH PROPOFOL N/A 09/21/2015   Procedure: ESOPHAGOGASTRODUODENOSCOPY (EGD) WITH PROPOFOL;  Surgeon: Lucilla Lame, MD;  Location: [facility] ENDOSCOPY;  Service: Endoscopy;  Laterality: N/A;  . ESOPHAGOGASTRODUODENOSCOPY (EGD) WITH PROPOFOL N/A 07/30/2016   Procedure: ESOPHAGOGASTRODUODENOSCOPY (EGD) WITH PROPOFOL;  Surgeon: Jonathon Bellows, MD;  Location: [facility] ENDOSCOPY;  Service: Endoscopy;  Laterality: N/A;  . ESOPHAGOGASTRODUODENOSCOPY (EGD) WITH PROPOFOL N/A 04/23/2018   Procedure: ESOPHAGOGASTRODUODENOSCOPY (EGD) WITH PROPOFOL;  Surgeon: Virgel Manifold, MD;  Location: [facility] ENDOSCOPY;  Service: Endoscopy;  Laterality: N/A;  . LEFT HEART CATH Right 01/25/2018   Procedure: Left Heart Cath and Coronary Angiography;  Surgeon: Dionisio David, MD;  Location: [place] [facility];  Service: Cardiovascular;  Laterality: Right;  . LEFT HEART CATH AND CORONARY ANGIOGRAPHY Right 08/12/2016   Procedure: Left Heart Cath and Coronary Angiography;  Surgeon: Dionisio David, MD;  Location: [place] [facility];  Service: Cardiovascular;  Laterality: Right;  . TONSILLECTOMY    . TUBAL LIGATION       Home Meds: Prior to Admission medications   Medication Sig Start Date End Date Taking? Authorizing Provider  acyclovir (ZOVIRAX) 400 MG tablet take 1 tablet by mouth twice a day Patient taking differently: Take 400 mg by mouth 2 (two) times daily.  11/13/17  Yes Lada, Satira Anis, MD  amLODipine (NORVASC) 5 MG tablet TAKE 1 TABLET(5 MG) BY MOUTH DAILY FOR BLOOD  PRESSURE Patient taking differently: Take 5 mg by mouth daily.  04/03/18  Yes Lada, Satira Anis, MD  amphetamine-dextroamphetamine (ADDERALL) 30 MG tablet Take 30 mg by mouth 2 (two) times daily.  12/02/16  Yes Lada, Satira Anis, MD  Ascorbic Acid (VITAMIN C) 100 MG tablet Take 100 mg by mouth daily.   Yes [provider]  aspirin EC 81 MG tablet Take 81  mg by mouth daily.   Yes [provider]  cefdinir (OMNICEF) 300 MG capsule Take 1 capsule (300 mg total) by mouth every 12 (twelve) hours for 5 days. 05/25/18 05/30/18 Yes Dustin Flock, MD  cholecalciferol (VITAMIN D) 1000 units tablet Take 1,000 Units by mouth daily.   Yes [provider]  diazepam (VALIUM) 10 MG tablet Take 1 tablet (10 mg total) by mouth every 8 (eight) hours as needed for anxiety. 09/22/15  Yes Fritzi Mandes, MD  fluticasone (FLONASE) 50 MCG/ACT nasal spray Place 2 sprays into both nostrils as needed. Patient taking differently: Place 2 sprays into both nostrils daily as needed for allergies.  09/22/17  Yes Lada, Satira Anis, MD  guaiFENesin-dextromethorphan (ROBITUSSIN DM) 100-10 MG/5ML syrup Take 5 mLs by mouth every 4 (four) hours as needed for cough. 05/25/18  Yes Dustin Flock, MD  metoprolol tartrate (LOPRESSOR) 50 MG tablet TAKE 1 TABLET BY MOUTH TWICE A DAY 03/15/18  Yes Lada, Satira Anis, MD  nystatin (MYCOSTATIN/NYSTOP) powder Apply topically 2 (two) times daily. 05/25/18  Yes Dustin Flock, MD  Omega-3 Fatty Acids (FISH OIL) 1000 MG CAPS Take 1 capsule by mouth daily.   Yes [provider]  PRALUENT 75 MG/ML SOPN Inject 75 mLs as directed every 14 (fourteen) days. 11/25/16  Yes [provider]  nitroGLYCERIN (NITROSTAT) 0.4 MG SL tablet Place 1 tablet (0.4 mg total) under the tongue every 5 (five) minutes as needed for chest pain. 01/26/18   Bettey Costa, MD  pantoprazole (PROTONIX) 40 MG tablet Take 1 tablet (40 mg total) by mouth 2 (two) times daily before a meal for 30 days. 04/23/18 05/23/18  Virgel Manifold, MD    Inpatient Medications:  . acyclovir  400 mg Oral BID  . amphetamine-dextroamphetamine  30 mg Oral BID  . aspirin EC  81 mg Oral Daily  . cholecalciferol  1,000 Units Oral Daily  . docusate sodium  100 mg Oral BID  . heparin  5,000 Units Subcutaneous Q8H  . metoprolol tartrate  50 mg Oral BID  . pantoprazole  40 mg  Oral BID AC  . vitamin C  250 mg Oral Daily   . piperacillin-tazobactam (ZOSYN)  IV 3.375 g (05/29/18 0455)    Allergies: No Known Allergies  Social History   Socioeconomic History  . Marital status: Divorced    Spouse name: Not on file  . Number of children: 2  . Years of education: Not on file  . Highest education level: GED or equivalent  Occupational History  . Occupation: disabled  Social Needs  . Financial resource strain: Not hard at all  . Food insecurity:    Worry: Never true    Inability: Never true  . Transportation needs:    Medical: No    Non-medical: No  Tobacco Use  . Smoking status: Former Smoker    Packs/day: 1.00    Years: 30.00    Pack years: 30.00    Types: Cigarettes    Last attempt to quit: 2006    Years since quitting: 14.1  . Smokeless tobacco: Never  Used  . Tobacco comment: smoking cessation materials not required  Substance and Sexual Activity  . Alcohol use: Not Currently    Alcohol/week: 0.0 standard drinks    Frequency: Never  . Drug use: No  . Sexual activity: Not Currently    Birth control/protection: None, Post-menopausal  Lifestyle  . Physical activity:    Days per week: 0 days    Minutes per session: 0 min  . Stress: Not at all  Relationships  . Social connections:    Talks on phone: Patient refused    Gets together: Patient refused    Attends religious service: Patient refused    Active member of club or organization: Patient refused    Attends meetings of clubs or organizations: Patient refused    Relationship status: Divorced  . Intimate partner violence:    Fear of current or ex partner: No    Emotionally abused: No    Physically abused: No    Forced sexual activity: No  Other Topics Concern  . Not on file  Social History Narrative  . Not on file     Family History  Problem Relation Age of Onset  . Diabetes Mother   . CVA Mother   . Heart disease Father   . Heart disease Sister   . Heart disease Brother    . Heart disease Sister   . Heart disease Sister   . Heart disease Sister   . Heart disease Brother   . Heart disease Brother   . Heart disease Brother      Review of Systems Positive for this of breath cough congestion Negative for: General:  chills, fever, night sweats or weight changes.  Cardiovascular: PND orthopnea syncope dizziness  Dermatological skin lesions rashes Respiratory: Positive for cough congestion Urologic: Frequent urination urination at night and hematuria Abdominal: negative for nausea, vomiting, diarrhea, bright red blood per rectum, melena, or hematemesis Neurologic: negative for visual changes, and/or hearing changes  All other systems reviewed and are otherwise negative except as noted above.  Labs: Recent Labs    05/28/18 2127 05/29/18 0724  TROPONINI 0.06* 0.07*   Lab Results  Component Value Date   WBC 9.6 05/29/2018   HGB 10.3 (L) 05/29/2018   HCT 33.3 (L) 05/29/2018   MCV 99.1 05/29/2018   PLT 695 (H) 05/29/2018    Recent Labs  Lab 05/29/18 0724  NA 139  K 3.8  CL 105  CO2 27  BUN 16  CREATININE 0.79  CALCIUM 7.8*  PROT 7.0  BILITOT 0.6  ALKPHOS 67  ALT 26  AST 19  GLUCOSE 103*   Lab Results  Component Value Date   CHOL 194 05/29/2018   HDL 34 (L) 05/29/2018   LDLCALC 119 (H) 05/29/2018   TRIG 203 (H) 05/29/2018   Lab Results  Component Value Date   DDIMER  05/30/2010    0.24        AT THE INHOUSE ESTABLISHED CUTOFF VALUE OF 0.48 ug/mL FEU, THIS ASSAY HAS BEEN DOCUMENTED IN THE LITERATURE TO HAVE A SENSITIVITY AND NEGATIVE PREDICTIVE VALUE OF AT LEAST 98 TO 99%.  THE TEST RESULT SHOULD BE CORRELATED WITH AN ASSESSMENT OF THE CLINICAL PROBABILITY OF DVT / VTE.    Radiology/Studies:  Dg Chest 2 View  Result Date: 05/29/2018 CLINICAL DATA:  Airspace opacity EXAM: CHEST - 2 VIEW COMPARISON:  Chest radiograph May 20, 2018 and chest CT February 14,2020 FINDINGS: There are pleural effusions bilaterally, better  appreciated on recent CT.  Patchy airspace consolidation is seen in the right upper lobe as well as in the right lower lobe. There is atelectatic change in the bases. Heart is mildly enlarged with pulmonary vascularity normal. No adenopathy. There is slight anterior wedging of the L1 vertebral body. IMPRESSION: Patchy airspace consolidation in portions of the right upper and lower lobes. Pleural effusions bilaterally. No new opacity. Stable cardiac prominence. Note that the retroareolar right breast mass seen on CT is not appreciable by radiography. Please see recommendations stated in chest CT report from 1 day prior. Aortic Atherosclerosis (ICD10-I70.0). Electronically Signed   By: Lowella Grip III M.D.   On: 05/29/2018 08:35   Ct Chest W Contrast  Result Date: 05/23/2018 CLINICAL DATA:  Patient with history of pneumonia. EXAM: CT CHEST WITH CONTRAST TECHNIQUE: Multidetector CT imaging of the chest was performed during intravenous contrast administration. CONTRAST:  45mL OMNIPAQUE IOHEXOL 300 MG/ML  SOLN COMPARISON:  Chest radiograph 05/20/2018; chest CT 01/22/2018 FINDINGS: Cardiovascular: Heart is enlarged. Trace fluid superior pericardial recess. Coronary arterial and thoracic aortic vascular calcifications. Mediastinum/Nodes: Normal appearance of the esophagus. Prominent and mildly enlarged mediastinal lymph nodes including a 1.4 cm precarinal node (image 48; series 3) and a 1.0 cm pretracheal node (image 28; series 3). Note is made of a 2.0 cm right hilar node (image 60; series 3). Lungs/Pleura: Central airways are patent. Interval development of irregular patchy areas of consolidation and ground-glass throughout the right upper, right middle and right lower lobes. Small layering right pleural effusion. Trace left effusion. Minimal left basilar atelectasis. No pneumothorax. Upper Abdomen: No acute process. Musculoskeletal: Thoracic spine degenerative changes. No aggressive or acute appearing osseous  lesions. IMPRESSION: Patchy ground-glass and consolidative opacities throughout the right lung concerning for multifocal pneumonia. Small right and trace left pleural effusions. Mediastinal adenopathy, likely reactive in etiology. Electronically Signed   By: Lovey Newcomer M.D.   On: 05/23/2018 15:11   Ct Angio Chest Pe W And/or Wo Contrast  Result Date: 05/29/2018 CLINICAL DATA:  Pancreatitis and abdomen pain. EXAM: CT ANGIOGRAPHY CHEST CT ABDOMEN AND PELVIS WITH CONTRAST TECHNIQUE: Multidetector CT imaging of the chest was performed using the standard protocol during bolus administration of intravenous contrast. Multiplanar CT image reconstructions and MIPs were obtained to evaluate the vascular anatomy. Multidetector CT imaging of the abdomen and pelvis was performed using the standard protocol during bolus administration of intravenous contrast. CONTRAST:  87mL OMNIPAQUE IOHEXOL 350 MG/ML SOLN COMPARISON:  CT abdomen pelvis April 09, 2018 FINDINGS: CTA CHEST FINDINGS Cardiovascular: Satisfactory opacification of the pulmonary arteries to the segmental level. No evidence of pulmonary embolism. Normal heart size. No pericardial effusion. Mediastinum/Nodes: Mild prominent lymph nodes identified in the mediastinum largest measures 1.4 cm in short axis. No hilar lymphadenopathy is identified. The trachea is normal. The esophagus is unremarkable. There is question 5 cm mass in the retroareolar right breast. Lungs/Pleura: Patchy consolidation is identified throughout right upper lobe, right middle lobe and to a lesser degree in the right lower lobe, inferior left upper lobe. There are bilateral pleural effusions, moderate right, small left. Musculoskeletal: Degenerative joint changes of the spine are identified. Review of the MIP images confirms the above findings. CT ABDOMEN and PELVIS FINDINGS Hepatobiliary: No focal liver lesion is identified. Minimal fluid is identified surrounding the gallbladder. The  gallbladder is otherwise normal. Biliary tree is normal. Pancreas: Unremarkable. No pancreatic ductal dilatation or surrounding inflammatory changes. Spleen: Normal in size without focal abnormality. Adrenals/Urinary Tract: Bilateral adrenal glands are normal. Left renal  vascular calcification is noted. No kidney stones are identified bilaterally. There is no hydronephrosis bilaterally. The bladder is normal. Stomach/Bowel: Stomach is within normal limits. Patient status post prior appendectomy. No evidence of bowel wall thickening, distention, or inflammatory changes. Vascular/Lymphatic: Aortic atherosclerosis. No enlarged abdominal or pelvic lymph nodes. Reproductive: Uterus and bilateral adnexa are unremarkable. Other: None. Musculoskeletal: Degenerative joint changes of the spine are noted. Review of the MIP images confirms the above findings. IMPRESSION: No pulmonary embolus. Patchy consolidation in both lung, nonspecific multifocal pneumonia is not excluded. Moderate right pleural effusion and small left pleural effusion. Mild enlarged mediastinal lymph nodes, which may be reactive lymph nodes. Question 5 cm mass in the retroareolar right breast. Further evaluation with diagnostic mammogram and possible ultrasound are recommended. No acute abnormality identified in the abdomen and pelvis. Minimal fluid identified around gallbladder, nonspecific. Electronically Signed   By: Abelardo Diesel M.D.   On: 05/29/2018 00:20   Ct Abdomen Pelvis W Contrast  Result Date: 05/29/2018 CLINICAL DATA:  Pancreatitis and abdomen pain. EXAM: CT ANGIOGRAPHY CHEST CT ABDOMEN AND PELVIS WITH CONTRAST TECHNIQUE: Multidetector CT imaging of the chest was performed using the standard protocol during bolus administration of intravenous contrast. Multiplanar CT image reconstructions and MIPs were obtained to evaluate the vascular anatomy. Multidetector CT imaging of the abdomen and pelvis was performed using the standard protocol  during bolus administration of intravenous contrast. CONTRAST:  42mL OMNIPAQUE IOHEXOL 350 MG/ML SOLN COMPARISON:  CT abdomen pelvis April 09, 2018 FINDINGS: CTA CHEST FINDINGS Cardiovascular: Satisfactory opacification of the pulmonary arteries to the segmental level. No evidence of pulmonary embolism. Normal heart size. No pericardial effusion. Mediastinum/Nodes: Mild prominent lymph nodes identified in the mediastinum largest measures 1.4 cm in short axis. No hilar lymphadenopathy is identified. The trachea is normal. The esophagus is unremarkable. There is question 5 cm mass in the retroareolar right breast. Lungs/Pleura: Patchy consolidation is identified throughout right upper lobe, right middle lobe and to a lesser degree in the right lower lobe, inferior left upper lobe. There are bilateral pleural effusions, moderate right, small left. Musculoskeletal: Degenerative joint changes of the spine are identified. Review of the MIP images confirms the above findings. CT ABDOMEN and PELVIS FINDINGS Hepatobiliary: No focal liver lesion is identified. Minimal fluid is identified surrounding the gallbladder. The gallbladder is otherwise normal. Biliary tree is normal. Pancreas: Unremarkable. No pancreatic ductal dilatation or surrounding inflammatory changes. Spleen: Normal in size without focal abnormality. Adrenals/Urinary Tract: Bilateral adrenal glands are normal. Left renal vascular calcification is noted. No kidney stones are identified bilaterally. There is no hydronephrosis bilaterally. The bladder is normal. Stomach/Bowel: Stomach is within normal limits. Patient status post prior appendectomy. No evidence of bowel wall thickening, distention, or inflammatory changes. Vascular/Lymphatic: Aortic atherosclerosis. No enlarged abdominal or pelvic lymph nodes. Reproductive: Uterus and bilateral adnexa are unremarkable. Other: None. Musculoskeletal: Degenerative joint changes of the spine are noted. Review of  the MIP images confirms the above findings. IMPRESSION: No pulmonary embolus. Patchy consolidation in both lung, nonspecific multifocal pneumonia is not excluded. Moderate right pleural effusion and small left pleural effusion. Mild enlarged mediastinal lymph nodes, which may be reactive lymph nodes. Question 5 cm mass in the retroareolar right breast. Further evaluation with diagnostic mammogram and possible ultrasound are recommended. No acute abnormality identified in the abdomen and pelvis. Minimal fluid identified around gallbladder, nonspecific. Electronically Signed   By: Abelardo Diesel M.D.   On: 05/29/2018 00:20   Dg Chest [employer] [facility] [employer] 1 View  Result  Date: 05/20/2018 CLINICAL DATA:  Acute respiratory failure EXAM: PORTABLE CHEST 1 VIEW COMPARISON:  05/19/2018 FINDINGS: Cardiac enlargement. Right PICC line with tip over the cavoatrial junction region. No pneumothorax. Airspace consolidation in the right lung and left lung base, similar to previous study. No blunting of costophrenic angles. IMPRESSION: Bilateral pulmonary infiltrates, similar to previous study. Electronically Signed   By: Lucienne Capers M.D.   On: 05/20/2018 03:39   Dg Chest Port 1 View  Result Date: 05/19/2018 CLINICAL DATA:  Pneumonia.  Sepsis. EXAM: PORTABLE CHEST 1 VIEW COMPARISON:  05/16/2018 FINDINGS: PICC tip is at the cavoatrial junction. There has been marked progression of the consolidative infiltrates in the right midzone and right lung base. There is increased accentuation of the interstitial markings in the left midzone. Heart size and vascularity are within normal limits considering the AP portable technique. Aortic atherosclerosis. No acute bone abnormality.  Coronary artery stent in place. IMPRESSION: 1. Marked progression of the pneumonia in the right mid and lower lung zones. 2. PICC tip at the cavoatrial junction in good position. 3.  Aortic Atherosclerosis (ICD10-I70.0). 4. Increased interstitial markings in the mid left  lung zone which may represent an early infiltrate. Electronically Signed   By: Lorriane Shire M.D.   On: 05/19/2018 15:44   Dg Chest Port 1 View  Result Date: 05/16/2018 CLINICAL DATA:  Shortness of breath EXAM: PORTABLE CHEST 1 VIEW COMPARISON:  03/26/2018 FINDINGS: Focal/patchy right upper lobe opacity, new. Patchy right lower lobe opacity, new. These findings are suspicious for multifocal pneumonia. Left lung is clear. No pleural effusion or pneumothorax. The heart is normal in size. IMPRESSION: Patchy right upper and lower lobe opacities, suspicious for multifocal pneumonia. Electronically Signed   By: Julian Hy M.D.   On: 05/16/2018 09:05   [place] Ekg Site Rite  Result Date: 05/16/2018 If Site Rite image not attached, placement could not be confirmed due to current cardiac rhythm.   EKG: Normal sinus rhythm  Weights: Filed Weights   05/28/18 2113  Weight: 98.4 kg     Physical Exam: Blood pressure 118/83, pulse 87, temperature 97.6 F (36.4 C), temperature source Oral, resp. rate 11, height 5\' 5"  (1.651 m), weight 98.4 kg, SpO2 94 %. Body mass index is 36.1 kg/m. General: Well developed, well nourished, in no acute distress. Head eyes ears nose throat: Normocephalic, atraumatic, sclera non-icteric, no xanthomas, nares are without discharge. No apparent thyromegaly and/or mass  Lungs: Normal respiratory effort.  Few wheezes, no rales, some rhonchi.  Heart: RRR with normal S1 S2. no murmur gallop, no rub, PMI is normal size and placement, carotid upstroke normal without bruit, jugular venous pressure is normal Abdomen: Soft, non-tender, non-distended with normoactive bowel sounds. No hepatomegaly. No rebound/guarding. No obvious abdominal masses. Abdominal aorta is normal size without bruit Extremities: No edema. no cyanosis, no clubbing, no ulcers  Peripheral : 2+ bilateral upper extremity pulses, 2+ bilateral femoral pulses, 2+ bilateral dorsal pedal pulse Neuro: Alert and  oriented. No facial asymmetry. No focal deficit. Moves all extremities spontaneously. Musculoskeletal: Normal muscle tone without kyphosis Psych:  Responds to questions appropriately with a normal affect.    Assessment: 63 year old female with known coronary artery disease's status post previous myocardial infarction essential hypertension hyperlipidemia on appropriate medication management for treatment of cardiovascular disease having shortness of breath status post recent pneumonia and no care evidence of acute coronary syndrome or congestive heart failure  Plan: 1.  Continue supportive care of post pneumonia inflammatory changes cough or  congestion 2.  Continue high intensity cholesterol therapy 3.  Hypertension control and antianginals as necessary for coronary artery disease 4.  No further cardiac diagnostics necessary at this time 5.  Further treatment options after above  Signed, Corey Skains M.D. [facility] 05/29/2018, 10:20 AM

## 2018-05-30 LAB — ECHOCARDIOGRAM COMPLETE
Height: 65 in
Weight: 3470.92 oz

## 2018-05-30 LAB — GLUCOSE, CAPILLARY: Glucose-Capillary: 75 mg/dL (ref 70–99)

## 2018-05-30 NOTE — Progress Notes (Signed)
Pt complains of feeling short of breath. Pt shows fine crackles to bases on ascultation, saturation on 2L is 98%. Respiratory consulted and prn duoneb given. I will continue to assess.

## 2018-05-30 NOTE — Progress Notes (Addendum)
[facility] [place] NAME: Linda Mejia    MR#:  [id number]  DATE OF BIRTH:  [birth year]  SUBJECTIVE:  CHIEF COMPLAINT:   Chief Complaint  Patient presents with  . Shortness of Breath   Better shortness of breath, on oxygen by nasal cannula 3 L. REVIEW OF SYSTEMS:  Review of Systems  Constitutional: Positive for malaise/fatigue. Negative for chills and fever.  HENT: Negative for sore throat.   Eyes: Negative for blurred vision and double vision.  Respiratory: Positive for cough and shortness of breath. Negative for hemoptysis, sputum production, wheezing and stridor.   Cardiovascular: Negative for chest pain, palpitations, orthopnea and leg swelling.  Gastrointestinal: Negative for abdominal pain, blood in stool, diarrhea, melena, nausea and vomiting.  Genitourinary: Negative for dysuria, flank pain and hematuria.  Musculoskeletal: Negative for back pain and joint pain.  Neurological: Negative for dizziness, sensory change, focal weakness, seizures, loss of consciousness, weakness and headaches.  Endo/Heme/Allergies: Negative for polydipsia.  Psychiatric/Behavioral: Negative for depression. The patient is not nervous/anxious.     DRUG ALLERGIES:  No Known Allergies VITALS:  Blood pressure 115/60, pulse 93, temperature 97.6 F (36.4 C), temperature source Oral, resp. rate 12, height 5\' 5"  (1.651 m), weight 98 kg, SpO2 94 %. PHYSICAL EXAMINATION:  Physical Exam Constitutional:      General: She is not in acute distress.    Appearance: Normal appearance.  HENT:     Head: Normocephalic.     Mouth/Throat:     Mouth: Mucous membranes are moist.  Eyes:     General: No scleral icterus.    Conjunctiva/sclera: Conjunctivae normal.     Pupils: Pupils are equal, round, and reactive to light.  Neck:     Musculoskeletal: Normal range of motion and neck supple.     Vascular: No JVD.     Trachea: No tracheal deviation.  Cardiovascular:   Rate and Rhythm: Normal rate and regular rhythm.     Heart sounds: Normal heart sounds. No murmur. No gallop.   Pulmonary:     Effort: Pulmonary effort is normal. No respiratory distress.     Breath sounds: Normal breath sounds. No wheezing or rales.     Comments: Crackles. Abdominal:     General: Bowel sounds are normal. There is no distension.     Palpations: Abdomen is soft.     Tenderness: There is no abdominal tenderness. There is no rebound.  Musculoskeletal: Normal range of motion.        General: No tenderness.     Right lower leg: No edema.     Left lower leg: No edema.  Skin:    Findings: No erythema or rash.  Neurological:     General: No focal deficit present.     Mental Status: She is alert and oriented to person, place, and time.     Cranial Nerves: No cranial nerve deficit.  Psychiatric:        Mood and Affect: Mood normal.    LABORATORY PANEL:  Female CBC Recent Labs  Lab 05/29/18 0724  WBC 9.6  HGB 10.3*  HCT 33.3*  PLT 695*   ------------------------------------------------------------------------------------------------------------------ Chemistries  Recent Labs  Lab 05/29/18 0724  NA 139  K 3.8  CL 105  CO2 27  GLUCOSE 103*  BUN 16  CREATININE 0.79  CALCIUM 7.8*  AST 19  ALT 26  ALKPHOS 67  BILITOT 0.6   RADIOLOGY:  No results found. ASSESSMENT AND PLAN:  1.    Acute respiratory failure with hypoxia: Likely related to pneumonia and pleural effusion.  Patient also has worsening pleural effusion.  Possible infected pleural effusion versus CHF.   Continue oxygen by nasal cannula, DuoNeb as needed. Continue IV Zosyn.   Bilateral pleural effusion.  IR guided thoracentesis on right side if possible.  2.  Positive troponin:  Possible due to demanding ischemia.   Per Dr. Nehemiah Massed, no further cardiac intervention or diagnostics necessary at this time. Continue metoprolol aspirin and consideration of ACE inhibitor as necessary for further  cardiovascular risk reduction.  3.  Questionable breast mass: No mass palpated on exam.  Patient's daughter present during examination.  Ultrasound breast ordered for further evaluation.  4.  Generalized weakness: Supportive care and physical therapy evaluation in a.m.   5.  Chronic other medical problems: Monitor.  Continue home medication as ordered  DVT prophylaxis: Heparin subcutaneous  Generalized weakness.  Ambulate patient. All the records are reviewed and case discussed with Care Management/Social Worker. Management plans discussed with the patient, her daughter and they are in agreement.  CODE STATUS: Full Code  TOTAL TIME TAKING CARE OF THIS PATIENT: 32 minutes.   More than 50% of the time was spent in counseling/coordination of care: YES  POSSIBLE D/C IN 2 DAYS, DEPENDING ON CLINICAL CONDITION.   [name] M.D on 05/30/2018 at 12:49 PM  Between 7am to 6pm - Pager - [phone]  After 6pm go to [email or web address] - [facility]

## 2018-05-30 NOTE — Progress Notes (Signed)
Summoned to pt room. Pt c/o "I can't breath." 02 sats 98 % on 2 litres. Heart rate 80. She is quite anxious. Lungs  Clear  And a bit diminished, but no wheezes heard. Med with valium 10 mg for the anxiety. Instructed  To breathe in thru the nose and out thru the mouth.

## 2018-05-30 NOTE — Progress Notes (Signed)
[facility] Encounter Note  Patient: Linda Mejia / Admit Date: 05/28/2018 / Date of Encounter: 05/30/2018, 8:35 AM   Subjective: Slight improvement from the respiratory standpoint overall but still claims that she is too short of breath at this time to go home.  She claims that she still needs oxygen at this time.  Patient is on antibiotics with some improvement.  No evidence of congestive heart failure or anginal symptoms at any point during her hospitalization.  Patient has had a troponin peak of 0.07 consistent with demand ischemia rather than acute coronary syndrome  Review of Systems: Positive for: Redness of breath cough congestion Negative for: Vision change, hearing change, syncope, dizziness, nausea, vomiting,diarrhea, bloody stool, stomach pain, positive for cough, congestion, negative for diaphoresis, urinary frequency, urinary pain,skin lesions, skin rashes Others previously listed  Objective: Telemetry: Normal sinus rhythm Physical Exam: Blood pressure 96/68, pulse 83, temperature 97.9 F (36.6 C), temperature source Oral, resp. rate 17, height 5\' 5"  (1.651 m), weight 98 kg, SpO2 96 %. Body mass index is 35.95 kg/m. General: Well developed, well nourished, in no acute distress. Head: Normocephalic, atraumatic, sclera non-icteric, no xanthomas, nares are without discharge. Neck: No apparent masses Lungs: Normal respirations with few wheezes, some rhonchi, no rales , no crackles   Heart: Regular rate and rhythm, normal S1 S2, no murmur, no rub, no gallop, PMI is normal size and placement, carotid upstroke normal without bruit, jugular venous pressure normal Abdomen: Soft, non-tender, non-distended with normoactive bowel sounds. No hepatosplenomegaly. Abdominal aorta is normal size without bruit Extremities: No edema, no clubbing, no cyanosis, no ulcers,  Peripheral: 2+ radial, 2+ femoral, 2+ dorsal pedal pulses Neuro: Alert and oriented. Moves all extremities  spontaneously. Psych:  Responds to questions appropriately with a normal affect.   Intake/Output Summary (Last 24 hours) at 05/30/2018 0835 Last data filed at 05/30/2018 5277 Gross per 24 hour  Intake 139.25 ml  Output -  Net 139.25 ml    Inpatient Medications:  . acyclovir  400 mg Oral BID  . amphetamine-dextroamphetamine  30 mg Oral BID  . aspirin EC  81 mg Oral Daily  . cholecalciferol  1,000 Units Oral Daily  . docusate sodium  100 mg Oral BID  . heparin  5,000 Units Subcutaneous Q8H  . metoprolol tartrate  50 mg Oral BID  . pantoprazole  40 mg Oral BID AC  . vitamin C  250 mg Oral Daily   Infusions:  . piperacillin-tazobactam (ZOSYN)  IV 3.375 g (05/30/18 0600)    Labs: Recent Labs    05/28/18 2127 05/29/18 0724  NA 139 139  K 4.2 3.8  CL 106 105  CO2 27 27  GLUCOSE 106* 103*  BUN 22 16  CREATININE 0.82 0.79  CALCIUM 7.8* 7.8*   Recent Labs    05/28/18 2127 05/29/18 0724  AST 27 19  ALT 28 26  ALKPHOS 72 67  BILITOT 0.9 0.6  PROT 7.3 7.0  ALBUMIN 3.3* 3.1*   Recent Labs    05/28/18 2127 05/29/18 0724  WBC 10.3 9.6  NEUTROABS 5.9  --   HGB 10.7* 10.3*  HCT 34.9* 33.3*  MCV 98.9 99.1  PLT 789* 695*   Recent Labs    05/28/18 2127 05/29/18 0724 05/29/18 1141 05/29/18 1754  TROPONINI 0.06* 0.07* 0.06* 0.07*   Invalid input(s): POCBNP No results for input(s): HGBA1C in the last 72 hours.   Weights: Filed Weights   05/28/18 2113 05/30/18 0628  Weight: 98.4  kg 98 kg     Radiology/Studies:  Dg Chest 2 View  Result Date: 05/29/2018 CLINICAL DATA:  Airspace opacity EXAM: CHEST - 2 VIEW COMPARISON:  Chest radiograph May 20, 2018 and chest CT February 14,2020 FINDINGS: There are pleural effusions bilaterally, better appreciated on recent CT. Patchy airspace consolidation is seen in the right upper lobe as well as in the right lower lobe. There is atelectatic change in the bases. Heart is mildly enlarged with pulmonary vascularity normal.  No adenopathy. There is slight anterior wedging of the L1 vertebral body. IMPRESSION: Patchy airspace consolidation in portions of the right upper and lower lobes. Pleural effusions bilaterally. No new opacity. Stable cardiac prominence. Note that the retroareolar right breast mass seen on CT is not appreciable by radiography. Please see recommendations stated in chest CT report from 1 day prior. Aortic Atherosclerosis (ICD10-I70.0). Electronically Signed   By: Lowella Grip III M.D.   On: 05/29/2018 08:35   Ct Chest W Contrast  Result Date: 05/23/2018 CLINICAL DATA:  Patient with history of pneumonia. EXAM: CT CHEST WITH CONTRAST TECHNIQUE: Multidetector CT imaging of the chest was performed during intravenous contrast administration. CONTRAST:  67mL OMNIPAQUE IOHEXOL 300 MG/ML  SOLN COMPARISON:  Chest radiograph 05/20/2018; chest CT 01/22/2018 FINDINGS: Cardiovascular: Heart is enlarged. Trace fluid superior pericardial recess. Coronary arterial and thoracic aortic vascular calcifications. Mediastinum/Nodes: Normal appearance of the esophagus. Prominent and mildly enlarged mediastinal lymph nodes including a 1.4 cm precarinal node (image 48; series 3) and a 1.0 cm pretracheal node (image 28; series 3). Note is made of a 2.0 cm right hilar node (image 60; series 3). Lungs/Pleura: Central airways are patent. Interval development of irregular patchy areas of consolidation and ground-glass throughout the right upper, right middle and right lower lobes. Small layering right pleural effusion. Trace left effusion. Minimal left basilar atelectasis. No pneumothorax. Upper Abdomen: No acute process. Musculoskeletal: Thoracic spine degenerative changes. No aggressive or acute appearing osseous lesions. IMPRESSION: Patchy ground-glass and consolidative opacities throughout the right lung concerning for multifocal pneumonia. Small right and trace left pleural effusions. Mediastinal adenopathy, likely reactive in  etiology. Electronically Signed   By: Lovey Newcomer M.D.   On: 05/23/2018 15:11   Ct Angio Chest Pe W And/or Wo Contrast  Result Date: 05/29/2018 CLINICAL DATA:  Pancreatitis and abdomen pain. EXAM: CT ANGIOGRAPHY CHEST CT ABDOMEN AND PELVIS WITH CONTRAST TECHNIQUE: Multidetector CT imaging of the chest was performed using the standard protocol during bolus administration of intravenous contrast. Multiplanar CT image reconstructions and MIPs were obtained to evaluate the vascular anatomy. Multidetector CT imaging of the abdomen and pelvis was performed using the standard protocol during bolus administration of intravenous contrast. CONTRAST:  44mL OMNIPAQUE IOHEXOL 350 MG/ML SOLN COMPARISON:  CT abdomen pelvis April 09, 2018 FINDINGS: CTA CHEST FINDINGS Cardiovascular: Satisfactory opacification of the pulmonary arteries to the segmental level. No evidence of pulmonary embolism. Normal heart size. No pericardial effusion. Mediastinum/Nodes: Mild prominent lymph nodes identified in the mediastinum largest measures 1.4 cm in short axis. No hilar lymphadenopathy is identified. The trachea is normal. The esophagus is unremarkable. There is question 5 cm mass in the retroareolar right breast. Lungs/Pleura: Patchy consolidation is identified throughout right upper lobe, right middle lobe and to a lesser degree in the right lower lobe, inferior left upper lobe. There are bilateral pleural effusions, moderate right, small left. Musculoskeletal: Degenerative joint changes of the spine are identified. Review of the MIP images confirms the above findings. CT ABDOMEN and PELVIS  FINDINGS Hepatobiliary: No focal liver lesion is identified. Minimal fluid is identified surrounding the gallbladder. The gallbladder is otherwise normal. Biliary tree is normal. Pancreas: Unremarkable. No pancreatic ductal dilatation or surrounding inflammatory changes. Spleen: Normal in size without focal abnormality. Adrenals/Urinary Tract:  Bilateral adrenal glands are normal. Left renal vascular calcification is noted. No kidney stones are identified bilaterally. There is no hydronephrosis bilaterally. The bladder is normal. Stomach/Bowel: Stomach is within normal limits. Patient status post prior appendectomy. No evidence of bowel wall thickening, distention, or inflammatory changes. Vascular/Lymphatic: Aortic atherosclerosis. No enlarged abdominal or pelvic lymph nodes. Reproductive: Uterus and bilateral adnexa are unremarkable. Other: None. Musculoskeletal: Degenerative joint changes of the spine are noted. Review of the MIP images confirms the above findings. IMPRESSION: No pulmonary embolus. Patchy consolidation in both lung, nonspecific multifocal pneumonia is not excluded. Moderate right pleural effusion and small left pleural effusion. Mild enlarged mediastinal lymph nodes, which may be reactive lymph nodes. Question 5 cm mass in the retroareolar right breast. Further evaluation with diagnostic mammogram and possible ultrasound are recommended. No acute abnormality identified in the abdomen and pelvis. Minimal fluid identified around gallbladder, nonspecific. Electronically Signed   By: Abelardo Diesel M.D.   On: 05/29/2018 00:20   Ct Abdomen Pelvis W Contrast  Result Date: 05/29/2018 CLINICAL DATA:  Pancreatitis and abdomen pain. EXAM: CT ANGIOGRAPHY CHEST CT ABDOMEN AND PELVIS WITH CONTRAST TECHNIQUE: Multidetector CT imaging of the chest was performed using the standard protocol during bolus administration of intravenous contrast. Multiplanar CT image reconstructions and MIPs were obtained to evaluate the vascular anatomy. Multidetector CT imaging of the abdomen and pelvis was performed using the standard protocol during bolus administration of intravenous contrast. CONTRAST:  33mL OMNIPAQUE IOHEXOL 350 MG/ML SOLN COMPARISON:  CT abdomen pelvis April 09, 2018 FINDINGS: CTA CHEST FINDINGS Cardiovascular: Satisfactory opacification of  the pulmonary arteries to the segmental level. No evidence of pulmonary embolism. Normal heart size. No pericardial effusion. Mediastinum/Nodes: Mild prominent lymph nodes identified in the mediastinum largest measures 1.4 cm in short axis. No hilar lymphadenopathy is identified. The trachea is normal. The esophagus is unremarkable. There is question 5 cm mass in the retroareolar right breast. Lungs/Pleura: Patchy consolidation is identified throughout right upper lobe, right middle lobe and to a lesser degree in the right lower lobe, inferior left upper lobe. There are bilateral pleural effusions, moderate right, small left. Musculoskeletal: Degenerative joint changes of the spine are identified. Review of the MIP images confirms the above findings. CT ABDOMEN and PELVIS FINDINGS Hepatobiliary: No focal liver lesion is identified. Minimal fluid is identified surrounding the gallbladder. The gallbladder is otherwise normal. Biliary tree is normal. Pancreas: Unremarkable. No pancreatic ductal dilatation or surrounding inflammatory changes. Spleen: Normal in size without focal abnormality. Adrenals/Urinary Tract: Bilateral adrenal glands are normal. Left renal vascular calcification is noted. No kidney stones are identified bilaterally. There is no hydronephrosis bilaterally. The bladder is normal. Stomach/Bowel: Stomach is within normal limits. Patient status post prior appendectomy. No evidence of bowel wall thickening, distention, or inflammatory changes. Vascular/Lymphatic: Aortic atherosclerosis. No enlarged abdominal or pelvic lymph nodes. Reproductive: Uterus and bilateral adnexa are unremarkable. Other: None. Musculoskeletal: Degenerative joint changes of the spine are noted. Review of the MIP images confirms the above findings. IMPRESSION: No pulmonary embolus. Patchy consolidation in both lung, nonspecific multifocal pneumonia is not excluded. Moderate right pleural effusion and small left pleural effusion.  Mild enlarged mediastinal lymph nodes, which may be reactive lymph nodes. Question 5 cm mass in the  retroareolar right breast. Further evaluation with diagnostic mammogram and possible ultrasound are recommended. No acute abnormality identified in the abdomen and pelvis. Minimal fluid identified around gallbladder, nonspecific. Electronically Signed   By: Abelardo Diesel M.D.   On: 05/29/2018 00:20   Dg Chest Port 1 View  Result Date: 05/20/2018 CLINICAL DATA:  Acute respiratory failure EXAM: PORTABLE CHEST 1 VIEW COMPARISON:  05/19/2018 FINDINGS: Cardiac enlargement. Right PICC line with tip over the cavoatrial junction region. No pneumothorax. Airspace consolidation in the right lung and left lung base, similar to previous study. No blunting of costophrenic angles. IMPRESSION: Bilateral pulmonary infiltrates, similar to previous study. Electronically Signed   By: Lucienne Capers M.D.   On: 05/20/2018 03:39   Dg Chest Port 1 View  Result Date: 05/19/2018 CLINICAL DATA:  Pneumonia.  Sepsis. EXAM: PORTABLE CHEST 1 VIEW COMPARISON:  05/16/2018 FINDINGS: PICC tip is at the cavoatrial junction. There has been marked progression of the consolidative infiltrates in the right midzone and right lung base. There is increased accentuation of the interstitial markings in the left midzone. Heart size and vascularity are within normal limits considering the AP portable technique. Aortic atherosclerosis. No acute bone abnormality.  Coronary artery stent in place. IMPRESSION: 1. Marked progression of the pneumonia in the right mid and lower lung zones. 2. PICC tip at the cavoatrial junction in good position. 3.  Aortic Atherosclerosis (ICD10-I70.0). 4. Increased interstitial markings in the mid left lung zone which may represent an early infiltrate. Electronically Signed   By: Lorriane Shire M.D.   On: 05/19/2018 15:44   Dg Chest Port 1 View  Result Date: 05/16/2018 CLINICAL DATA:  Shortness of breath EXAM: PORTABLE CHEST  1 VIEW COMPARISON:  03/26/2018 FINDINGS: Focal/patchy right upper lobe opacity, new. Patchy right lower lobe opacity, new. These findings are suspicious for multifocal pneumonia. Left lung is clear. No pleural effusion or pneumothorax. The heart is normal in size. IMPRESSION: Patchy right upper and lower lobe opacities, suspicious for multifocal pneumonia. Electronically Signed   By: Julian Hy M.D.   On: 05/16/2018 09:05   [place] Ekg Site Rite  Result Date: 05/16/2018 If Site Rite image not attached, placement could not be confirmed due to current cardiac rhythm.    Assessment and Recommendation  63 y.o. female with known cardiovascular disease status post previous myocardial infarction 100% right coronary artery stenosis with other moderate atherosclerosis having continued respiratory distress and cough congestion with possible previous pneumonia and inflammatory lung issues without evidence of congestive heart failure or myocardial infarction 1.  Continue supportive care for pulmonary infection inflammatory issues hypoxia and shortness of breath cough congestion 2.  No further cardiac intervention or diagnostics necessary at this time 3.  Continue metoprolol aspirin and consideration of ACE inhibitor as necessary for further cardiovascular risk reduction 4.  Begin ambulation and further treatment from cardiovascular standpoint depending on symptoms 5.  Okay for discharge home from cardiovascular standpoint with follow-up next week for further adjustments of medication management  Signed, Serafina Royals M.D. FACC

## 2018-05-31 ENCOUNTER — Inpatient Hospital Stay: Payer: Medicare Other

## 2018-05-31 ENCOUNTER — Encounter: Payer: Self-pay | Admitting: *Deleted

## 2018-05-31 ENCOUNTER — Institutional Professional Consult (permissible substitution): Payer: Medicare Other | Admitting: Internal Medicine

## 2018-05-31 DIAGNOSIS — R0902 Hypoxemia: Secondary | ICD-10-CM

## 2018-05-31 DIAGNOSIS — J189 Pneumonia, unspecified organism: Principal | ICD-10-CM

## 2018-05-31 DIAGNOSIS — J918 Pleural effusion in other conditions classified elsewhere: Secondary | ICD-10-CM

## 2018-05-31 LAB — CBC
HCT: 32.2 % — ABNORMAL LOW (ref 36.0–46.0)
Hemoglobin: 9.8 g/dL — ABNORMAL LOW (ref 12.0–15.0)
MCH: 31 pg (ref 26.0–34.0)
MCHC: 30.4 g/dL (ref 30.0–36.0)
MCV: 101.9 fL — ABNORMAL HIGH (ref 80.0–100.0)
Platelets: 711 10*3/uL — ABNORMAL HIGH (ref 150–400)
RBC: 3.16 MIL/uL — ABNORMAL LOW (ref 3.87–5.11)
RDW: 14 % (ref 11.5–15.5)
WBC: 8.7 10*3/uL (ref 4.0–10.5)
nRBC: 0 % (ref 0.0–0.2)

## 2018-05-31 LAB — BODY FLUID CELL COUNT WITH DIFFERENTIAL
Eos, Fluid: 0 %
Lymphs, Fluid: 42 %
Monocyte-Macrophage-Serous Fluid: 40 %
Neutrophil Count, Fluid: 18 %
Total Nucleated Cell Count, Fluid: 536 cu mm

## 2018-05-31 LAB — BASIC METABOLIC PANEL
Anion gap: 5 (ref 5–15)
BUN: 13 mg/dL (ref 8–23)
CO2: 31 mmol/L (ref 22–32)
Calcium: 8.1 mg/dL — ABNORMAL LOW (ref 8.9–10.3)
Chloride: 104 mmol/L (ref 98–111)
Creatinine, Ser: 0.77 mg/dL (ref 0.44–1.00)
GFR calc Af Amer: >60 mL/min (ref >60)
GFR calc non Af Amer: >60 mL/min (ref >60)
Glucose, Bld: 98 mg/dL (ref 70–99)
Potassium: 4.2 mmol/L (ref 3.5–5.1)
Sodium: 140 mmol/L (ref 135–145)

## 2018-05-31 LAB — LACTATE DEHYDROGENASE, PLEURAL OR PERITONEAL FLUID: LD, Fluid: 90 U/L — ABNORMAL HIGH (ref 3–23)

## 2018-05-31 LAB — GLUCOSE, CAPILLARY: Glucose-Capillary: 86 mg/dL (ref 70–99)

## 2018-05-31 LAB — GLUCOSE, PLEURAL OR PERITONEAL FLUID: Glucose, Fluid: 96 mg/dL

## 2018-05-31 MED ORDER — SODIUM CHLORIDE 0.9 % IV SOLN
INTRAVENOUS | Status: DC | PRN
  Administered 2018-05-31 – 2018-06-01 (×2): 500 mL via INTRAVENOUS

## 2018-05-31 NOTE — Progress Notes (Addendum)
[facility] [place] NAME: Linda Mejia    MR#:  [id number]  DATE OF BIRTH:  Nov 13, 1955  SUBJECTIVE:  CHIEF COMPLAINT:   Chief Complaint  Patient presents with  . Shortness of Breath   Discomfort when deep breathing following thoracentesis today. Unable to say if shortness of breath is improved. Does not feel ready to go home today.  REVIEW OF SYSTEMS:  Review of Systems  Constitutional: Positive for malaise/fatigue. Negative for chills and fever.  HENT: Negative for sore throat.   Eyes: Negative for blurred vision and double vision.  Respiratory: Positive for cough and shortness of breath. Negative for hemoptysis, sputum production, wheezing and stridor.   Cardiovascular: Negative for chest pain, palpitations, orthopnea and leg swelling.  Gastrointestinal: Negative for abdominal pain, blood in stool, diarrhea, melena, nausea and vomiting.  Genitourinary: Negative for dysuria, flank pain and hematuria.  Musculoskeletal: Negative for back pain and joint pain. Positive for pain at thoracentesis site R posterior thorax, worse with deep breathing. Neurological: Negative for dizziness, sensory change, focal weakness, seizures, loss of consciousness, weakness and headaches.  Endo/Heme/Allergies: Negative for polydipsia.  Psychiatric/Behavioral: Negative for depression. The patient is not nervous/anxious.    DRUG ALLERGIES:  No Known Allergies VITALS:  Blood pressure 118/73, pulse 89, temperature 98.7 F (37.1 C), temperature source Oral, resp. rate 18, height 5\' 5"  (1.651 m), weight 97.6 kg, SpO2 97 %. PHYSICAL EXAMINATION:  Physical Exam Constitutional:      General: She is not in acute distress.    Appearance: Normal appearance.  HENT:     Head: Normocephalic.     Mouth/Throat:     Mouth: Mucous membranes are moist.  Eyes:     General: No scleral icterus.    Conjunctiva/sclera: Conjunctivae normal.     Pupils: Pupils are equal, round,  and reactive to light.  Neck:     Musculoskeletal: Normal range of motion and neck supple.     Vascular: No JVD.     Trachea: No tracheal deviation.  Cardiovascular:     Rate and Rhythm: Normal rate and regular rhythm.     Heart sounds: Normal heart sounds. No murmur. No gallop.   Pulmonary:     Effort: Pulmonary effort is normal. No respiratory distress.     Breath sounds: Normal breath sounds. No wheezing or rales.     Comments: Trace crackles Abdominal:     General: Bowel sounds are normal. There is no distension.     Palpations: Abdomen is soft.     Tenderness: There is no abdominal tenderness. There is no rebound.  Musculoskeletal: Normal range of motion.        General: No tenderness.     Right lower leg: No edema.     Left lower leg: No edema.  Skin:    Findings: Post-procedural erythema at R posterior thorax with intact bandage Neurological:     General: No focal deficit present.     Mental Status: She is alert and oriented to person, place, and time.     Cranial Nerves: No cranial nerve deficit.  Psychiatric:        Mood and Affect: Mood normal.  LABORATORY PANEL:  Female CBC Recent Labs  Lab 05/31/18 0724  WBC 8.7  HGB 9.8*  HCT 32.2*  PLT 711*   ------------------------------------------------------------------------------------------------------------------ Chemistries  Recent Labs  Lab 05/29/18 0724 05/31/18 0724  NA 139 140  K 3.8 4.2  CL 105 104  CO2  27 31  GLUCOSE 103* 98  BUN 16 13  CREATININE 0.79 0.77  CALCIUM 7.8* 8.1*  AST 19  --   ALT 26  --   ALKPHOS 67  --   BILITOT 0.6  --    RADIOLOGY:  Dg Chest 1 View  Result Date: 05/31/2018 CLINICAL DATA:  Post right thoracentesis. EXAM: CHEST  1 VIEW COMPARISON:  05/29/2018 and chest CT 05/28/2018 FINDINGS: Lungs are somewhat hypoinflated with stable patchy consolidation over the right lung likely due to infection. Possible mild hazy opacification in the left retrocardiac region unchanged.  Significant improvement post right thoracentesis with no significant residual right pleural fluid collection identified. No pneumothorax. Cardiomediastinal silhouette and remainder of the exam is unchanged. IMPRESSION: Significant improvement post right thoracentesis. No significant residual pleural fluid collection. No pneumothorax. Persistent patchy opacification over the right lung with stable hazy left retrocardiac density likely due to infection. Recommend follow-up to resolution. Electronically Signed   By: Marin Olp M.D.   On: 05/31/2018 09:59   US Thoracentesis Asp Pleural Space W/img Guide  Result Date: 05/31/2018 INDICATION: Patient admitted with acute hypoxic respiratory failure likely secondary to pneumonia, bilateral pleural effusions noted on imaging. Request for diagnostic and therapeutic thoracentesis today in IR. EXAM: ULTRASOUND GUIDED RIGHT THORACENTESIS MEDICATIONS: 10 mL 1% lidocaine. COMPLICATIONS: None immediate. PROCEDURE: An ultrasound guided thoracentesis was thoroughly discussed with the patient and questions answered. The benefits, risks, alternatives and complications were also discussed. The patient understands and wishes to proceed with the procedure. Written consent was obtained. Ultrasound was performed to localize and mark an adequate pocket of fluid in the right chest. The area was then prepped and draped in the normal sterile fashion. 1% Lidocaine was used for local anesthesia. Under ultrasound guidance a 6 Fr Safe-T-Centesis catheter was introduced. Thoracentesis was performed. The catheter was removed and a dressing applied. FINDINGS: A total of approximately 550 mL of clear yellow fluid was removed. Samples were sent to the laboratory as requested by the clinical team. IMPRESSION: Successful ultrasound guided right thoracentesis yielding 550 mL of pleural fluid. Read by Candiss Norse, PA-C Electronically Signed   By: Aletta Edouard M.D.   On: 05/31/2018 10:01    ASSESSMENT AND PLAN:   1.  Acute respiratory failure with hypoxia:Likely related to pneumonia and pleural effusion. Patient also has worsening pleural effusion. Possible infected pleural effusion versus CHF less likely per cardiology eval. Appreciate recommendations. Continue oxygen by nasal cannula, DuoNeb as needed. Continue IV Zosyn.   Bilateral pleural effusion.  IR guided thoracentesis on right side completed today. 550 mL fluid removed, sent for analysis. LDH elevated.  - C/s placed to pulmonology today for evaluation of multifocal pneumonia/pleural effusion.   2.Positive troponin: Possible due to demanding ischemia.   Per Dr. Nehemiah Massed, no further cardiac intervention or diagnostics necessary at this time. Continue metoprolol aspirin and consideration of ACE inhibitor as necessary for further cardiovascular risk reduction.  3.Questionable breast mass:No mass palpated on exam. Patient's daughter present during initial examination. - Ultrasound breast ordered for further evaluation however not covered by insurance without prior mammogram/without medical urgency documented. Pending mammogram.  4.Generalized weakness:Supportive care and physical therapy evaluation pending today.  5.Chronic other medical problems:Monitor. Continue home medication as ordered  DVT prophylaxis:Heparin subcutaneous  Generalized weakness.  Ambulate patient. All the records are reviewed and case discussed with Care Management/Social Worker. Management plans discussed with the patient, her daughter and they are in agreement.  CODE STATUS: Full Code  TOTAL TIME TAKING  CARE OF THIS PATIENT: 30 minutes.   More than 50% of the time was spent in counseling/coordination of care: YES  POSSIBLE D/C IN 1-2 DAYS, DEPENDING ON CLINICAL CONDITION.   Laryn Venning PA-C on 05/31/2018 at 12:13 PM  Between 7am to 6pm - Pager - [phone]  After 6 pm go to [email or web address] - [place]  [facility]  Office  [phone]  CC: Primary care physician; Arnetha Courser, MD

## 2018-05-31 NOTE — Progress Notes (Signed)
Pt ambulated in hallway with stand-by supervision.  SpO2 88-91% on RA while walking.  Returned to room and O2 resumed at 1.5 L/min [place].  Redness noted to right side of back with pain in right lumbar region.  Consulted with Serenity K, RN and Dr. Manuella Ghazi.  Will continue to monitor.

## 2018-05-31 NOTE — Procedures (Signed)
PROCEDURE SUMMARY:  Successful image-guided right thoracentesis. Yielded 550 milliliters of clear yellow fluid. Patient tolerated procedure well. EBL: Zero No immediate complications.  Specimen was sent for labs. Post procedure CXR shows no pneumothorax.  Please see imaging section of Epic for full dictation.  Joaquim Nam PA-C 05/31/2018 9:32 AM

## 2018-05-31 NOTE — Consult Note (Signed)
Linda Mejia Consultation      Assessment and Plan:  Pneumonia with parapneumonic effusion. - Suspect the patient's exudative pleural effusion is a sequela of recent pneumococcal pneumonia. - Patient is now status post right thoracentesis.  We will continue to monitor, patient may require repeat thoracentesis during this admission.  --Patient will need follow up outpatient to follow up on thoracentesis results and repeat CXR to see if another is needed.   COPD.  --Recommend DC on advair or similar combination inhaler.  --May benefit from outpatient referral to pulm rehab.   Patient was given my card and asked to follow up in 2-4 weeks. Will need CXR before appointment for pleural effusion.   Date: 05/31/2018  MRN# [id number] Linda Mejia April 16, 1955  Referring Physician: Dr. Carlynn Spry via epic message for pleural effusion.   Linda Mejia is a 63 y.o. old female seen in consultation for chief complaint of:    Chief Complaint  Patient presents with  . Shortness of Breath    HPI:  The patient is a 63 year old female her history includes CAD, HTN, HLD, tobacco abuse, PUD, H.Pylori, ADHD, and anxiety.  The patient was recently admitted to the hospital on 05/16/2018, and discharged on 2/11 for multifocal pneumonia, requiring ICU admission.  She was found to have hypercapnic hypoxic respiratory failure, with strep pneumonia sepsis. She presented back to the hospital on 2/14 with increasing shortness of breath and sharp pleuritic chest pain radiating to the back.  Imaging showed right-sided pleural effusion.  Patient underwent right thoracentesis on 2/17 with drainage of 550 mL's of clear yellow fluid.  Review of studies shows fluid is predominantly lymphocytic and monocytic.  Appears to be exudative.  Fluid culture pending.  Patient currently being maintained on Zosyn.  Troponin negative. Echocardiogram is been ordered, results are pending.  BNP slightly elevated 723.  There is  minimal leukocytosis, patient has remained afebrile since admission, currently on 2 L nasal cannula. She feels that her breathing is improved since the drainage of fluid.  However she is worried that she went home too early from previous admission, and is concerned about getting enough help and someone to check on her at home.  She notes that she was fairly active before this hit her.  Imaging personally reviewed, CT chest and chest x-ray, there is a moderate right-sided pleural effusion.  There is significant right paratracheal, subcarinal, right hilar lymphadenopathy.    PMHX:   Past Medical History:  Diagnosis Date  . Abnormal ankle brachial index (ABI) 12/02/2016  . ADHD (attention deficit hyperactivity disorder)   . Anxiety   . Aortic atherosclerosis ([place]) 12/02/2016   July 2018  . Chronic back pain   . Coronary artery disease   . Herpes genitalis in women   . History of stomach ulcers    x7 this year. Bleeding  . Hypertension   . Prediabetes 12/02/2016  . Pulmonary hypertension ([place]) 03/09/2018   Echo Oct 2019   Surgical Hx:  Past Surgical History:  Procedure Laterality Date  . APPENDECTOMY    . CARDIAC CATHETERIZATION    . CESAREAN SECTION    . COLON SURGERY     interception  . ESOPHAGOGASTRODUODENOSCOPY (EGD) WITH PROPOFOL N/A 09/21/2015   Procedure: ESOPHAGOGASTRODUODENOSCOPY (EGD) WITH PROPOFOL;  Surgeon: Lucilla Lame, MD;  Location: [facility] ENDOSCOPY;  Service: Endoscopy;  Laterality: N/A;  . ESOPHAGOGASTRODUODENOSCOPY (EGD) WITH PROPOFOL N/A 07/30/2016   Procedure: ESOPHAGOGASTRODUODENOSCOPY (EGD) WITH PROPOFOL;  Surgeon: Jonathon Bellows, MD;  Location:  [place];  Service: Endoscopy;  Laterality: N/A;  . ESOPHAGOGASTRODUODENOSCOPY (EGD) WITH PROPOFOL N/A 04/23/2018   Procedure: ESOPHAGOGASTRODUODENOSCOPY (EGD) WITH PROPOFOL;  Surgeon: Virgel Manifold, MD;  Location: [facility] ENDOSCOPY;  Service: Endoscopy;  Laterality: N/A;  . LEFT HEART CATH Right 01/25/2018   Procedure:  Left Heart Cath and Coronary Angiography;  Surgeon: Dionisio David, MD;  Location: [place] [facility];  Service: Cardiovascular;  Laterality: Right;  . LEFT HEART CATH AND CORONARY ANGIOGRAPHY Right 08/12/2016   Procedure: Left Heart Cath and Coronary Angiography;  Surgeon: Dionisio David, MD;  Location: [place] [facility];  Service: Cardiovascular;  Laterality: Right;  . TONSILLECTOMY    . TUBAL LIGATION     Family Hx:  Family History  Problem Relation Age of Onset  . Diabetes Mother   . CVA Mother   . Heart disease Father   . Heart disease Sister   . Heart disease Brother   . Heart disease Sister   . Heart disease Sister   . Heart disease Sister   . Heart disease Brother   . Heart disease Brother   . Heart disease Brother    Social Hx:   Social History   Tobacco Use  . Smoking status: Former Smoker    Packs/day: 1.00    Years: 30.00    Pack years: 30.00    Types: Cigarettes    Last attempt to quit: 2006    Years since quitting: 14.1  . Smokeless tobacco: Never Used  . Tobacco comment: smoking cessation materials not required  Substance Use Topics  . Alcohol use: Not Currently    Alcohol/week: 0.0 standard drinks    Frequency: Never  . Drug use: No   Medication:    Current Facility-Administered Medications:  .  acetaminophen (TYLENOL) tablet 650 mg, 650 mg, Oral, Q6H PRN, 650 mg at 05/29/18 1452 **OR** acetaminophen (TYLENOL) suppository 650 mg, 650 mg, Rectal, Q6H PRN, [name], MD .  acyclovir (ZOVIRAX) 200 MG capsule 400 mg, 400 mg, Oral, BID, [name], MD, 400 mg at 05/31/18 1105 .  albuterol (PROVENTIL) (2.5 MG/3ML) 0.083% nebulizer solution 2.5 mg, 2.5 mg, Nebulization, Q6H PRN, [name], MD, 2.5 mg at 05/30/18 1405 .  amphetamine-dextroamphetamine (ADDERALL) tablet 30 mg, 30 mg, Oral, BID, [name], MD, 30 mg at 05/31/18 0848 .  aspirin EC tablet 81 mg, 81 mg, Oral, Daily, [name], MD, 81 mg at 05/31/18 1110 .  bisacodyl  (DULCOLAX) EC tablet 5 mg, 5 mg, Oral, Daily PRN, [name], MD .  cholecalciferol (VITAMIN D3) tablet 1,000 Units, 1,000 Units, Oral, Daily, [name], MD, 1,000 Units at 05/31/18 1110 .  diazepam (VALIUM) tablet 10 mg, 10 mg, Oral, Q8H PRN, [name], MD, 10 mg at 05/31/18 0849 .  docusate sodium (COLACE) capsule 100 mg, 100 mg, Oral, BID, [name], MD, 100 mg at 05/31/18 1109 .  fluticasone (FLONASE) 50 MCG/ACT nasal spray 2 spray, 2 spray, Each Nare, Daily PRN, [name], MD .  guaiFENesin-dextromethorphan (ROBITUSSIN DM) 100-10 MG/5ML syrup 5 mL, 5 mL, Oral, Q4H PRN, [name], MD .  heparin injection 5,000 Units, 5,000 Units, Subcutaneous, Q8H, [name], MD, 5,000 Units at 05/31/18 0520 .  HYDROcodone-acetaminophen (NORCO/VICODIN) 5-325 MG per tablet 1-2 tablet, 1-2 tablet, Oral, Q4H PRN, [name], MD, 2 tablet at 05/31/18 1109 .  ipratropium (ATROVENT) nebulizer solution 0.5 mg, 0.5 mg, Nebulization, Q6H PRN, [name], MD, 0.5 mg at 05/30/18 1405 .  metoprolol tartrate (LOPRESSOR)  tablet 50 mg, 50 mg, Oral, BID, [name], MD, 50 mg at 05/31/18 1110 .  nitroGLYCERIN (NITROSTAT) SL tablet 0.4 mg, 0.4 mg, Sublingual, Q5 min PRN, [name], MD .  ondansetron (ZOFRAN) tablet 4 mg, 4 mg, Oral, Q6H PRN **OR** ondansetron (ZOFRAN) injection 4 mg, 4 mg, Intravenous, Q6H PRN, [name], MD .  pantoprazole (PROTONIX) EC tablet 40 mg, 40 mg, Oral, BID AC, [name], MD, 40 mg at 05/31/18 0848 .  piperacillin-tazobactam (ZOSYN) IVPB 3.375 g, 3.375 g, Intravenous, Q8H, [name], MD, Last Rate: 12.5 mL/hr at 05/31/18 0519, 3.375 g at 05/31/18 0519 .  traZODone (DESYREL) tablet 25 mg, 25 mg, Oral, QHS PRN, [name], MD, 25 mg at 05/30/18 2016 .  vitamin C (ASCORBIC ACID) tablet 250 mg, 250 mg, Oral, Daily, [name], MD, 250 mg at 05/31/18 1106   Allergies:  Patient has no known allergies.  Review of  Systems: Gen:  Denies  fever, sweats, chills HEENT: Denies blurred vision, double vision. bleeds, sore throat Cvc:  No dizziness, chest pain. Resp:   Denies cough or sputum production, shortness of breath Gi: Denies swallowing difficulty, stomach pain. Gu:  Denies bladder incontinence, burning urine Ext:   No Joint pain, stiffness. Skin: No skin rash,  hives  Endoc:  No polyuria, polydipsia. Psych: No depression, insomnia. Other:  All other systems were reviewed with the patient and were negative other that what is mentioned in the HPI.   Physical Examination:   VS: BP 118/73   Pulse 89   Temp 98.7 F (37.1 C) (Oral)   Resp 18   Ht 5\' 5"  (1.651 m)   Wt 97.6 kg   SpO2 97%   BMI 35.82 kg/m   General Appearance: No distress  Neuro:without focal findings,  speech normal,  HEENT: PERRLA, EOM intact.   Pulmonary: normal breath sounds, No wheezing.  CardiovascularNormal S1,S2.  No m/r/g.   Abdomen: Benign, Soft, non-tender. Renal:  No costovertebral tenderness  GU:  No performed at this time. Endoc: No evident thyromegaly, no signs of acromegaly. Skin:   warm, no rashes, no ecchymosis  Extremities: normal, no cyanosis, clubbing.  Other findings:    LABORATORY PANEL:   CBC Recent Labs  Lab 05/31/18 0724  WBC 8.7  HGB 9.8*  HCT 32.2*  PLT 711*   ------------------------------------------------------------------------------------------------------------------  Chemistries  Recent Labs  Lab 05/29/18 0724 05/31/18 0724  NA 139 140  K 3.8 4.2  CL 105 104  CO2 27 31  GLUCOSE 103* 98  BUN 16 13  CREATININE 0.79 0.77  CALCIUM 7.8* 8.1*  AST 19  --   ALT 26  --   ALKPHOS 67  --   BILITOT 0.6  --    ------------------------------------------------------------------------------------------------------------------  Cardiac Enzymes Recent Labs  Lab 05/29/18 1754  TROPONINI 0.07*   ------------------------------------------------------------  RADIOLOGY:  Dg  Chest 1 View  Result Date: 05/31/2018 CLINICAL DATA:  Post right thoracentesis. EXAM: CHEST  1 VIEW COMPARISON:  05/29/2018 and chest CT 05/28/2018 FINDINGS: Lungs are somewhat hypoinflated with stable patchy consolidation over the right lung likely due to infection. Possible mild hazy opacification in the left retrocardiac region unchanged. Significant improvement post right thoracentesis with no significant residual right pleural fluid collection identified. No pneumothorax. Cardiomediastinal silhouette and remainder of the exam is unchanged. IMPRESSION: Significant improvement post right thoracentesis. No significant residual pleural fluid collection. No pneumothorax. Persistent patchy opacification over the right lung with stable hazy left retrocardiac density likely due to infection. Recommend  follow-up to resolution. Electronically Signed   By: Marin Olp M.D.   On: 05/31/2018 09:59   US Thoracentesis Asp Pleural Space W/img Guide  Result Date: 05/31/2018 INDICATION: Patient admitted with acute hypoxic respiratory failure likely secondary to pneumonia, bilateral pleural effusions noted on imaging. Request for diagnostic and therapeutic thoracentesis today in IR. EXAM: ULTRASOUND GUIDED RIGHT THORACENTESIS MEDICATIONS: 10 mL 1% lidocaine. COMPLICATIONS: None immediate. PROCEDURE: An ultrasound guided thoracentesis was thoroughly discussed with the patient and questions answered. The benefits, risks, alternatives and complications were also discussed. The patient understands and wishes to proceed with the procedure. Written consent was obtained. Ultrasound was performed to localize and mark an adequate pocket of fluid in the right chest. The area was then prepped and draped in the normal sterile fashion. 1% Lidocaine was used for local anesthesia. Under ultrasound guidance a 6 Fr Safe-T-Centesis catheter was introduced. Thoracentesis was performed. The catheter was removed and a dressing applied.  FINDINGS: A total of approximately 550 mL of clear yellow fluid was removed. Samples were sent to the laboratory as requested by the clinical team. IMPRESSION: Successful ultrasound guided right thoracentesis yielding 550 mL of pleural fluid. Read by Candiss Norse, PA-C Electronically Signed   By: Aletta Edouard M.D.   On: 05/31/2018 10:01       Thank  you for the consultation and for allowing [facility] to assist in the care of your patient. Our recommendations are noted above.  Please contact us if we can be of further service.   Marda Stalker, M.D., F.C.C.P.  Board Certified in Internal Mejia, Pulmonary Mejia, [place], and Sleep Mejia.  [facility] Office Number: [phone]   05/31/2018

## 2018-05-31 NOTE — Progress Notes (Signed)
Spoke with Dr. Lennie Muckle regarding diagnostic mammogram and u/s of right breast recommended per CT chest.  For this to be completed as an in-patient, the  attending MD must document the medical urgency.  Otherwise, these tests should be completed as an out-patient.  Message regarding above sent to Dr. Manuella Ghazi.

## 2018-05-31 NOTE — Plan of Care (Signed)
  Problem: Education: Goal: Knowledge of General Education information will improve Description Including pain rating scale, medication(s)/side effects and non-pharmacologic comfort measures Outcome: Progressing Note:  Understanding verified through teach back.   Problem: Health Behavior/Discharge Planning: Goal: Ability to manage health-related needs will improve Outcome: Progressing Note:  Understanding verified through teach back.   Problem: Clinical Measurements: Goal: Ability to maintain clinical measurements within normal limits will improve Outcome: Progressing Goal: Will remain free from infection Outcome: Progressing Goal: Diagnostic test results will improve Outcome: Progressing Goal: Respiratory complications will improve Outcome: Progressing Note:  Thoracentesis removed 550 mL from Rt pleural space. Goal: Cardiovascular complication will be avoided Outcome: Progressing   Problem: Activity: Goal: Risk for activity intolerance will decrease Outcome: Progressing   Problem: Nutrition: Goal: Adequate nutrition will be maintained Outcome: Progressing   Problem: Coping: Goal: Level of anxiety will decrease Outcome: Progressing Note:  Valium provided immediately prior to thoracentesis with good effect.   Problem: Elimination: Goal: Will not experience complications related to bowel motility Outcome: Progressing Goal: Will not experience complications related to urinary retention Outcome: Progressing   Problem: Pain Managment: Goal: General experience of comfort will improve Outcome: Progressing Note:  Medication provided and encouraged distraction and repositioning for comfort.   Problem: Safety: Goal: Ability to remain free from injury will improve Outcome: Progressing Note:  Remains free of injury.   Problem: Skin Integrity: Goal: Risk for impaired skin integrity will decrease Outcome: Progressing

## 2018-06-01 ENCOUNTER — Other Ambulatory Visit: Payer: Self-pay | Admitting: *Deleted

## 2018-06-01 DIAGNOSIS — R0600 Dyspnea, unspecified: Secondary | ICD-10-CM

## 2018-06-01 LAB — GLUCOSE, CAPILLARY
Glucose-Capillary: 61 mg/dL — ABNORMAL LOW (ref 70–99)
Glucose-Capillary: 158 mg/dL — ABNORMAL HIGH (ref 70–99)
Glucose-Capillary: 91 mg/dL (ref 70–99)
Glucose-Capillary: 83 mg/dL (ref 70–99)

## 2018-06-01 LAB — PH, BODY FLUID: pH, Body Fluid: 7.5

## 2018-06-01 LAB — CBC
HCT: 31.7 % — ABNORMAL LOW (ref 36.0–46.0)
Hemoglobin: 9.6 g/dL — ABNORMAL LOW (ref 12.0–15.0)
MCH: 30.2 pg (ref 26.0–34.0)
MCHC: 30.3 g/dL (ref 30.0–36.0)
MCV: 99.7 fL (ref 80.0–100.0)
Platelets: 692 10*3/uL — ABNORMAL HIGH (ref 150–400)
RBC: 3.18 MIL/uL — ABNORMAL LOW (ref 3.87–5.11)
RDW: 13.8 % (ref 11.5–15.5)
WBC: 8.5 10*3/uL (ref 4.0–10.5)
nRBC: 0 % (ref 0.0–0.2)

## 2018-06-01 LAB — BASIC METABOLIC PANEL
Anion gap: 5 (ref 5–15)
BUN: 15 mg/dL (ref 8–23)
CO2: 32 mmol/L (ref 22–32)
Calcium: 8.2 mg/dL — ABNORMAL LOW (ref 8.9–10.3)
Chloride: 102 mmol/L (ref 98–111)
Creatinine, Ser: 0.81 mg/dL (ref 0.44–1.00)
GFR calc Af Amer: >60 mL/min (ref >60)
GFR calc non Af Amer: >60 mL/min (ref >60)
Glucose, Bld: 115 mg/dL — ABNORMAL HIGH (ref 70–99)
Potassium: 4.1 mmol/L (ref 3.5–5.1)
Sodium: 139 mmol/L (ref 135–145)

## 2018-06-01 LAB — PROTEIN, BODY FLUID (OTHER): Total Protein, Body Fluid Other: 2.8 g/dL

## 2018-06-01 LAB — CYTOLOGY - NON PAP

## 2018-06-01 MED ORDER — IBUPROFEN 200 MG PO TABS: 400.0000 mg | ORAL_TABLET | Freq: Four times a day (QID) | ORAL | 0 refills | Status: AC | PRN

## 2018-06-01 MED ORDER — FLUTICASONE-SALMETEROL 100-50 MCG/DOSE IN AEPB: 1.0000 | INHALATION_SPRAY | Freq: Two times a day (BID) | RESPIRATORY_TRACT | 0 refills | Status: DC

## 2018-06-01 MED ORDER — FUROSEMIDE 20 MG PO TABS: 20.0000 mg | ORAL_TABLET | Freq: Every day | ORAL | 0 refills | Status: DC

## 2018-06-01 MED ORDER — ORAL CARE MOUTH RINSE
15.0000 mL | Freq: Two times a day (BID) | OROMUCOSAL | Status: DC
  Administered 2018-06-01: 15 mL via OROMUCOSAL

## 2018-06-01 NOTE — Care Management Note (Signed)
Case Management Note  Patient Details  Name: Linda Mejia MRN: [id number] Date of Birth: November 04, 1955  Subjective/Objective:       Patient is discharging today with oxygen from [facility] and [facility] home health, RN, PT.  [name] with [name] is aware of discharge.  Patient's daughter will transport her home.  She denies difficulties accessing medical care or obtaining prescriptions.  She is current with her PCP.  No further needs identified at this time.               Action/Plan:   Expected Discharge Date:  06/01/18               Expected Discharge Plan:  [name]  In-House Referral:     Discharge planning Services  CM Consult  Post Acute Care Choice:    Choice offered to:  Patient  DME Arranged:  Oxygen DME Agency:  [place] Arranged:  RN, PT [facility] Agency:  [place]  Status of Service:  Completed, signed off  If discussed at [place] of Stay Meetings, dates discussed:    Additional Comments:  Elza Rafter, RN 06/01/2018, 5:03 PM

## 2018-06-01 NOTE — Care Management Important Message (Signed)
Copy of signed Medicare IM left with patient in room. 

## 2018-06-01 NOTE — Progress Notes (Signed)
Pt educated on how to use her home oxygen tanks.  No further questions voiced regarding discharge instructions.  Escorted pt via W/C to daughter's POV.

## 2018-06-01 NOTE — Discharge Instructions (Signed)
New medication:  1. Advair inhaler per pulmonologist Dr. Ashby Dawes. Take 1 puff into the lungs twice daily. Follow-up with pulmonology for continued shortness of breath management. 2. Lasix 20 mg by mouth daily, for treating the residual excess fluid in your lungs. This medication is a diuretic, or water pill.  Information on both of these is attached to your paperwork.   Please follow-up with Dr. Sanda Klein within 5 days.  Please follow-up with the pulmonologist (lung doctor) who saw you while you were in the hospital within 2 weeks.   Schedule your mammogram ASAP.

## 2018-06-01 NOTE — Progress Notes (Signed)
SATURATION QUALIFICATIONS: (This note is used to comply with regulatory documentation for home oxygen)  Patient Saturations on Room Air at Rest = 91%  Patient Saturations on Room Air while Ambulating = 82%  Patient Saturations on 1 Liters of oxygen while Ambulating = 94%  Please briefly explain why patient needs home oxygen: sustained oxygen desaturation with activity when on RA.    Pt reports history of COPD. [facility] documentation shows SOBE since 2016.

## 2018-06-01 NOTE — Discharge Summary (Signed)
[facility] [place] NAME: Linda Mejia    MR#:  [id number]  DATE OF BIRTH:  1955/12/27  DATE OF ADMISSION:  05/28/2018   ADMITTING PHYSICIAN: [name], MD  DATE OF DISCHARGE: No discharge date for patient encounter.  PRIMARY CARE PHYSICIAN: Arnetha Courser, MD   ADMISSION DIAGNOSIS:  Myalgia [M79.10] Mass [R22.9] Acute respiratory failure with hypoxia (HCC) [J96.01] Nonspecific chest pain [R07.9] DISCHARGE DIAGNOSIS:  Active Problems:   Hypoxia  SECONDARY DIAGNOSIS:   Past Medical History:  Diagnosis Date  . Abnormal ankle brachial index (ABI) 12/02/2016  . ADHD (attention deficit hyperactivity disorder)   . Anxiety   . Aortic atherosclerosis ([place]) 12/02/2016   July 2018  . Chronic back pain   . Coronary artery disease   . Herpes genitalis in women   . History of stomach ulcers    x7 this year. Bleeding  . Hypertension   . Prediabetes 12/02/2016  . Pulmonary hypertension ([place]) 03/09/2018   Echo Oct 2019   HOSPITAL COURSE:   Elizabeht Suto  is a 63 y.o. female with a known history of ADHD, aortic atherosclerosis, hypertension, pulmonary hypertension, CAD, chronic back pain, UTI and prediabetes. The patient last presented to the ED 05/16/18. At that time she was found to be septic with tachycardia, tachypnea and a lactic acidosis. Chest x-ray showed right side multi-focal pneumonia. Presented again for this admission 2/15 with shortness of breath.  * Acute respiratory failure with hypoxia:Likely related to pneumoniaand pleural effusion. Thought was infected pleural effusion versus CHF for etilogy, CHF less likely per cardiology c/s.  Pulmonology c/s while admitted as well. *Patient will need a CXR prior to scheduled pulmonology follow-up. Pulmonology recommended Advair inhaler, given and will follow-up as above.  Pulmonary rehab referral order placed. 7 day course of Lasix given for residual pleural effusion and shortness of  breath. Follow-up with pulmonary rehab as above. Home oxygen 1 L ordered based on qualification for home oxygen, documented by RN. Continued oxygen by nasal cannula, DuoNeb as needed while admitted.  Received IV Zosyn.  * Bilateral pleural effusion.  IR guided thoracentesis on right side completed 2/17. 550 mL fluid removed, sent for analysis. LDH elevated.  Pain at thoracentesis site and diffuse at posterior thorax, given short course of ibuprofen to take for likely pleuritic pain per Dr. Manuella Ghazi. She requested narcotics. Not given. NSAID temporary only, she has a history of GI bleeding.   * Positive troponin:Possible due to demand ischemia.  Per cardiology/Dr. Karin Lieu further cardiac intervention or diagnostics necessary at this time.Continue metoprolol aspirin and consideration of ACE inhibitor as necessary for further cardiovascular risk reduction.  * Questionable breast mass:No mass palpated on initial exam. Patient's daughter present during initial examination. Ultrasound breast ordered for further evaluation however not covered by insurance without prior mammogram/without medical urgency documented. RN spoke with Dr. Radford Pax at [facility] who stated the mass on CT is likely a lymph node * Will need outpatient mammogram and follow-up ultrasound R breast with PCP as insurance would not cover inpatient. Order is in.  * Generalized weakness:Supportive care and physical therapy evaluation while admitted. Home Health PT ordered  * Chronic other medical problems: Continue home medications  DVT prophylaxis:received Heparin subcutaneous while admitted   Home health RN also ordered at discharge  DISCHARGE CONDITIONS:  Stable  CONSULTS OBTAINED:  Treatment Team:  Corey Skains, MD Allyne Gee, MD DRUG ALLERGIES:  No Known Allergies DISCHARGE MEDICATIONS:  Allergies as of 06/01/2018   No Known Allergies     Medication List    STOP taking  these medications   cefdinir 300 MG capsule Commonly known as:  OMNICEF     TAKE these medications   acyclovir 400 MG tablet Commonly known as:  ZOVIRAX take 1 tablet by mouth twice a day   amLODipine 5 MG tablet Commonly known as:  NORVASC TAKE 1 TABLET(5 MG) BY MOUTH DAILY FOR BLOOD PRESSURE What changed:  See the new instructions.   amphetamine-dextroamphetamine 30 MG tablet Commonly known as:  ADDERALL Take 30 mg by mouth 2 (two) times daily.   aspirin EC 81 MG tablet Take 81 mg by mouth daily.   cholecalciferol 1000 units tablet Commonly known as:  VITAMIN D Take 1,000 Units by mouth daily.   diazepam 10 MG tablet Commonly known as:  VALIUM Take 1 tablet (10 mg total) by mouth every 8 (eight) hours as needed for anxiety.   Fish Oil 1000 MG Caps Take 1 capsule by mouth daily.   fluticasone 50 MCG/ACT nasal spray Commonly known as:  FLONASE Place 2 sprays into both nostrils as needed. What changed:    when to take this  reasons to take this   Fluticasone-Salmeterol 100-50 MCG/DOSE Aepb Commonly known as:  ADVAIR DISKUS Inhale 1 puff into the lungs 2 (two) times daily.   furosemide 20 MG tablet Commonly known as:  LASIX Take 1 tablet (20 mg total) by mouth daily.   guaiFENesin-dextromethorphan 100-10 MG/5ML syrup Commonly known as:  ROBITUSSIN DM Take 5 mLs by mouth every 4 (four) hours as needed for cough.   ibuprofen 200 MG tablet Commonly known as:  ADVIL Take 2 tablets (400 mg total) by mouth every 6 (six) hours as needed for up to 4 days for moderate pain.   metoprolol tartrate 50 MG tablet Commonly known as:  LOPRESSOR TAKE 1 TABLET BY MOUTH TWICE A DAY   nitroGLYCERIN 0.4 MG SL tablet Commonly known as:  NITROSTAT Place 1 tablet (0.4 mg total) under the tongue every 5 (five) minutes as needed for chest pain.   nystatin powder Commonly known as:  MYCOSTATIN/NYSTOP Apply topically 2 (two) times daily.   pantoprazole 40 MG tablet Commonly  known as:  PROTONIX Take 1 tablet (40 mg total) by mouth 2 (two) times daily before a meal for 30 days.   PRALUENT 75 MG/ML Sopn Generic drug:  Alirocumab Inject 75 mLs as directed every 14 (fourteen) days.   vitamin C 100 MG tablet Take 100 mg by mouth daily.            Durable Medical Equipment  (From admission, onward)         Start     Ordered   06/01/18 1326  DME Oxygen  Once    Question Answer Comment  Mode or (Route) Nasal cannula   Liters per Minute 1   Frequency Continuous (stationary and portable oxygen unit needed)   Oxygen delivery system Gas      06/01/18 1332           DISCHARGE INSTRUCTIONS:   DIET:  Regular diet DISCHARGE CONDITION:  Stable ACTIVITY:  Activity as tolerated OXYGEN:  Home Oxygen: Yes.    Oxygen Delivery: 1 liters/min via Patient connected to nasal cannula oxygen DISCHARGE LOCATION:  home   If you experience worsening of your admission symptoms, develop shortness of breath, life threatening emergency, suicidal or homicidal thoughts you must seek medical attention immediately by  calling 911 or calling your MD immediately if your symptoms are severe.  You Must read complete instructions/literature along with all the possible adverse reactions/side effects for all the medicines you take and that have been prescribed to you. Take any new medicines only after you have completely understood and accept all the possible adverse reactions/side effects.   Please note  You were cared for by a hospitalist during your hospital stay. If you have any questions about your discharge medications or the care you received while you were in the hospital after you are discharged, you can call the unit and asked to speak with the hospitalist on call if the hospitalist that took care of you is not available. Once you are discharged, your primary care physician will handle any further medical issues. Please note that NO REFILLS for any discharge medications  will be authorized once you are discharged, as it is imperative that you return to your primary care physician (or establish a relationship with a primary care physician if you do not have one) for your aftercare needs so that they can reassess your need for medications and monitor your lab values.    On the day of Discharge:  VITAL SIGNS:  Blood pressure 117/70, pulse 76, temperature 98.3 F (36.8 C), temperature source Oral, resp. rate 16, height 5\' 5"  (1.651 m), weight 96.8 kg, SpO2 98 %. PHYSICAL EXAMINATION:  Physical Exam Constitutional:  General: She is not in acute distress. O2 via [place].  Appearance: Normal appearance.  HENT:  Head: Normocephalic.  Mouth/Throat:  Mouth: Mucous membranes are moist.  Eyes:  General: No scleral icterus. Conjunctiva/sclera: Conjunctivae normal.  Pupils: Pupils are equal, round, and reactive to light.  Neck:  Musculoskeletal: Normal range of motionand neck supple.  Vascular: No JVD.  Trachea: No tracheal deviation.  Cardiovascular:  Rate and Rhythm: Normal rateand regular rhythm.  Heart sounds: Normal heart sounds.No murmur. [place]og[place]op.  Pulmonary:  Effort: Pulmonary effort is normal. Norespiratory distress.  Breath sounds: Normal breath sounds. Nowheezingor rales.  Comments: Trace crackles diffuse bilaterally Abdominal:  General: Bowel sounds are normal. There is nodistension.  Palpations: Abdomen is soft.  Tenderness: There is no abdominal tenderness. There is norebound.  Musculoskeletal:Normal range of motion.  General: No tenderness.  Right lower leg: No edema.  Left lower leg: No edema.  Skin: Findings: Post-procedural erythema at R posterior thorax with intact bandage, improved from 2/17. Tender to palpation. Neurological:  General: No focal deficitpresent.  Mental Status: She is alertand oriented to person, place, and time.  Cranial  Nerves: No cranial nerve deficit.  Psychiatric:  Mood and Affect: Moodnormal.   DATA REVIEW:   CBC Recent Labs  Lab 06/01/18 0426  WBC 8.5  HGB 9.6*  HCT 31.7*  PLT 692*    Chemistries  Recent Labs  Lab 05/29/18 0724  06/01/18 0426  NA 139   < > 139  K 3.8   < > 4.1  CL 105   < > 102  CO2 27   < > 32  GLUCOSE 103*   < > 115*  BUN 16   < > 15  CREATININE 0.79   < > 0.81  CALCIUM 7.8*   < > 8.2*  AST 19  --   --   ALT 26  --   --   ALKPHOS 67  --   --   BILITOT 0.6  --   --    < > = values in this interval not displayed.  Microbiology Results  Results for orders placed or performed during the hospital encounter of 05/28/18  Body fluid culture     Status: None (Preliminary result)   Collection Time: 05/31/18  9:10 AM  Result Value Ref Range Status   Specimen Description   Final    PLEURAL Performed at [facility], [address] [place], [place] [address]    Special Requests   Final    NONE Performed at [facility], [address] [place], [place] [address]    Gram Stain   Final    CYTOSPIN SMEAR WBC PRESENT, PREDOMINANTLY MONONUCLEAR NO ORGANISMS SEEN    Culture   Final    NO GROWTH < 24 HOURS Performed at [facility], [place] [address], [place], [place] [address]    Report Status PENDING  Incomplete    RADIOLOGY:  No results found.  Management plans discussed with the patient and/or family and they are in agreement.  CODE STATUS: Full Code   TOTAL TIME TAKING CARE OF THIS PATIENT: 45 minutes.   Ripley Fraise PA-C on 06/01/2018 at 3:29 PM  Between 7am to 6pm - Pager - [phone]  After 6pm go to [email or web address] - Proofreader  [facility]  Hospitalists  Office  [phone]  CC: Primary care physician; Arnetha Courser, MD

## 2018-06-01 NOTE — Plan of Care (Signed)
Continues on 2.5L O2 acutely, still receiving IV abx Problem: Activity: Goal: Risk for activity intolerance will decrease Outcome: Progressing Note:  Up to bathroom independently tolerating well   Problem: Coping: Goal: Level of anxiety will decrease Outcome: Progressing   Problem: Elimination: Goal: Will not experience complications related to urinary retention Outcome: Progressing   Problem: Safety: Goal: Ability to remain free from injury will improve Outcome: Progressing   Problem: Pain Managment: Goal: General experience of comfort will improve Outcome: Not Progressing Note:  Continues to complain of right lower back pain treated twice with norco   Problem: Education: Goal: Knowledge of General Education information will improve Description Including pain rating scale, medication(s)/side effects and non-pharmacologic comfort measures Outcome: Completed/Met   Problem: Nutrition: Goal: Adequate nutrition will be maintained Outcome: Completed/Met

## 2018-06-01 NOTE — Progress Notes (Signed)
Write spoke with Dr. Radford Pax at [facility] via telephone.  Dr. Radford Pax stated that the mass seen on CT is likely a lymph node, and is related to the breast tissue falling to the side while she was lying on her back.  Dr. Radford Pax stated that the patient needs a bilateral mammogram, and if needed, a subsequent ultrasound of the right breast.  Dr. Radford Pax stated that this should be completed as an out-patient.    Relayed above information to Dr. Manuella Ghazi, and requested him to dialogue directly with Dr. Radford Pax [phone]) to resolve the situation.

## 2018-06-02 ENCOUNTER — Inpatient Hospital Stay: Payer: Medicare Other | Admitting: Family Medicine

## 2018-06-03 ENCOUNTER — Telehealth: Payer: Self-pay

## 2018-06-03 DIAGNOSIS — J181 Lobar pneumonia, unspecified organism: Secondary | ICD-10-CM | POA: Diagnosis not present

## 2018-06-03 DIAGNOSIS — M791 Myalgia, unspecified site: Secondary | ICD-10-CM | POA: Diagnosis not present

## 2018-06-03 DIAGNOSIS — Z9981 Dependence on supplemental oxygen: Secondary | ICD-10-CM | POA: Diagnosis not present

## 2018-06-03 DIAGNOSIS — G8929 Other chronic pain: Secondary | ICD-10-CM | POA: Diagnosis not present

## 2018-06-03 DIAGNOSIS — I272 Pulmonary hypertension, unspecified: Secondary | ICD-10-CM | POA: Diagnosis not present

## 2018-06-03 DIAGNOSIS — J9601 Acute respiratory failure with hypoxia: Secondary | ICD-10-CM | POA: Diagnosis not present

## 2018-06-03 DIAGNOSIS — I10 Essential (primary) hypertension: Secondary | ICD-10-CM | POA: Diagnosis not present

## 2018-06-03 DIAGNOSIS — M545 Low back pain: Secondary | ICD-10-CM | POA: Diagnosis not present

## 2018-06-03 DIAGNOSIS — I2510 Atherosclerotic heart disease of native coronary artery without angina pectoris: Secondary | ICD-10-CM | POA: Diagnosis not present

## 2018-06-03 DIAGNOSIS — R229 Localized swelling, mass and lump, unspecified: Secondary | ICD-10-CM | POA: Diagnosis not present

## 2018-06-03 DIAGNOSIS — R7303 Prediabetes: Secondary | ICD-10-CM | POA: Diagnosis not present

## 2018-06-03 DIAGNOSIS — I700 Atherosclerosis of aorta: Secondary | ICD-10-CM | POA: Diagnosis not present

## 2018-06-03 DIAGNOSIS — Z87440 Personal history of urinary (tract) infections: Secondary | ICD-10-CM | POA: Diagnosis not present

## 2018-06-03 LAB — BODY FLUID CULTURE: Culture: NO GROWTH

## 2018-06-03 NOTE — Telephone Encounter (Signed)
Transition Care Management Follow-up Telephone Call  Date of discharge and from where: 06/01/18 [facility]  How have you been since you were released from the hospital? Pt states she is doing okay. She complains of back pain and weakness. Home health physical therapy and nursing in place.   Any questions or concerns? No   Items Reviewed:  Did the pt receive and understand the discharge instructions provided? Yes   Medications obtained and verified? Yes   Any new allergies since your discharge? No   Dietary orders reviewed? Yes  Do you have support at home? Yes daughter lives nearby  Functional Questionnaire: (I = Independent and D = Dependent) ADLs: I  Bathing/Dressing- I  Meal Prep- I  Eating- I  Maintaining continence- I  Transferring/Ambulation- I  Managing Meds- I  Follow up appointments reviewed:   PCP Hospital f/u appt confirmed? Yes  Scheduled to see Dr. Sanda Klein on 06/14/18 @ 10:20.  [facility] f/u appt confirmed? Yes  Scheduled to see Dr. Ashby Dawes on 06/10/18.  Are transportation arrangements needed? No   If their condition worsens, is the pt aware to call PCP or go to the Emergency Dept.? Yes  Was the patient provided with contact information for the PCP's office or ED? Yes  Was to pt encouraged to call back with questions or concerns? Yes

## 2018-06-04 ENCOUNTER — Other Ambulatory Visit: Payer: Self-pay

## 2018-06-04 DIAGNOSIS — J90 Pleural effusion, not elsewhere classified: Secondary | ICD-10-CM

## 2018-06-07 ENCOUNTER — Other Ambulatory Visit: Payer: Self-pay

## 2018-06-07 DIAGNOSIS — M545 Low back pain: Secondary | ICD-10-CM | POA: Diagnosis not present

## 2018-06-07 DIAGNOSIS — M791 Myalgia, unspecified site: Secondary | ICD-10-CM | POA: Diagnosis not present

## 2018-06-07 DIAGNOSIS — R7303 Prediabetes: Secondary | ICD-10-CM | POA: Diagnosis not present

## 2018-06-07 DIAGNOSIS — I700 Atherosclerosis of aorta: Secondary | ICD-10-CM | POA: Diagnosis not present

## 2018-06-07 DIAGNOSIS — I2510 Atherosclerotic heart disease of native coronary artery without angina pectoris: Secondary | ICD-10-CM | POA: Diagnosis not present

## 2018-06-07 DIAGNOSIS — Z87440 Personal history of urinary (tract) infections: Secondary | ICD-10-CM | POA: Diagnosis not present

## 2018-06-07 DIAGNOSIS — I272 Pulmonary hypertension, unspecified: Secondary | ICD-10-CM | POA: Diagnosis not present

## 2018-06-07 DIAGNOSIS — R229 Localized swelling, mass and lump, unspecified: Secondary | ICD-10-CM | POA: Diagnosis not present

## 2018-06-07 DIAGNOSIS — G8929 Other chronic pain: Secondary | ICD-10-CM | POA: Diagnosis not present

## 2018-06-07 DIAGNOSIS — Z9981 Dependence on supplemental oxygen: Secondary | ICD-10-CM | POA: Diagnosis not present

## 2018-06-07 DIAGNOSIS — J9601 Acute respiratory failure with hypoxia: Secondary | ICD-10-CM | POA: Diagnosis not present

## 2018-06-07 DIAGNOSIS — J181 Lobar pneumonia, unspecified organism: Secondary | ICD-10-CM | POA: Diagnosis not present

## 2018-06-07 DIAGNOSIS — I10 Essential (primary) hypertension: Secondary | ICD-10-CM | POA: Diagnosis not present

## 2018-06-07 NOTE — Patient Outreach (Signed)
Patient triggered Red on EMMI General Discharge Dashboard, notification sent to:  Milas Gain, RN

## 2018-06-07 NOTE — Patient Outreach (Signed)
[facility]) Care Management  06/07/2018  Linda Mejia May 27, 1955 [id number]   EMMI- General Discharge RED ON EMMI ALERT Day # 4 Date: 06/06/2018 Red Alert Reason:  Lost interest in things? Yes  Sad/hopeless/anxious/empty? Yes  Other questions/problems? Yes    Outreach attempt: Spoke with patient.  She is able to verify HIPAA.  She states she is not so much depressed but just frustrated with her care over the last few months and her change to her body.  She states she will see the pulmonologist and will ask questions.  CM encouraged patient to ask questions and discuss care goals with the physician on visit.  She verbalized understanding and appreciative of call.  Patient denies any problems with transportation to appointment.  Patient denies any questions, concerns, or needs.     Plan: RN CM will close case.  Jone Baseman, RN, MSN [facility] Care Management Care Management Coordinator Direct Line [phone] Toll Free: [phone]  Fax: [phone]

## 2018-06-08 DIAGNOSIS — I700 Atherosclerosis of aorta: Secondary | ICD-10-CM | POA: Diagnosis not present

## 2018-06-08 DIAGNOSIS — I272 Pulmonary hypertension, unspecified: Secondary | ICD-10-CM | POA: Diagnosis not present

## 2018-06-08 DIAGNOSIS — M791 Myalgia, unspecified site: Secondary | ICD-10-CM | POA: Diagnosis not present

## 2018-06-08 DIAGNOSIS — M545 Low back pain: Secondary | ICD-10-CM | POA: Diagnosis not present

## 2018-06-08 DIAGNOSIS — Z87440 Personal history of urinary (tract) infections: Secondary | ICD-10-CM | POA: Diagnosis not present

## 2018-06-08 DIAGNOSIS — I10 Essential (primary) hypertension: Secondary | ICD-10-CM | POA: Diagnosis not present

## 2018-06-08 DIAGNOSIS — J9601 Acute respiratory failure with hypoxia: Secondary | ICD-10-CM | POA: Diagnosis not present

## 2018-06-08 DIAGNOSIS — G8929 Other chronic pain: Secondary | ICD-10-CM | POA: Diagnosis not present

## 2018-06-08 DIAGNOSIS — J181 Lobar pneumonia, unspecified organism: Secondary | ICD-10-CM | POA: Diagnosis not present

## 2018-06-08 DIAGNOSIS — R7303 Prediabetes: Secondary | ICD-10-CM | POA: Diagnosis not present

## 2018-06-08 DIAGNOSIS — R229 Localized swelling, mass and lump, unspecified: Secondary | ICD-10-CM | POA: Diagnosis not present

## 2018-06-08 DIAGNOSIS — I2510 Atherosclerotic heart disease of native coronary artery without angina pectoris: Secondary | ICD-10-CM | POA: Diagnosis not present

## 2018-06-08 DIAGNOSIS — Z9981 Dependence on supplemental oxygen: Secondary | ICD-10-CM | POA: Diagnosis not present

## 2018-06-10 ENCOUNTER — Institutional Professional Consult (permissible substitution): Payer: Medicare Other | Admitting: Internal Medicine

## 2018-06-11 ENCOUNTER — Telehealth: Payer: Self-pay | Admitting: Family Medicine

## 2018-06-11 ENCOUNTER — Inpatient Hospital Stay: Payer: Medicare Other | Admitting: Family Medicine

## 2018-06-11 NOTE — Telephone Encounter (Signed)
We just need Linda Mejia to know... We have not seen patient for hospital follow-up I don't see any record in the chart that we ever approved home health orders We just want her to be aware that we have no record of approving home health and I won't be able to sign orders She's not been seen so they may not get reimbursed (my understanding) Please document for the chart the first day of their orders and who gave them, because nothing is in media tab or phone calls

## 2018-06-11 NOTE — Telephone Encounter (Signed)
Copied from [place] [id number]. Topic: General - Other >> Jun 11, 2018  2:26 PM Keene Breath wrote: Reason for CRM: Gae Bon, with home health, called to inform the doctor that the patient refused her home health visit today.  Home health feels that the patient does not want to continue with these visits.  They will try again next week.  Please advise and call back if there are any questions.  CB# [phone]

## 2018-06-11 NOTE — Telephone Encounter (Signed)
Linda Mejia was notified, but she states it likes like hospital placed Dr. Manuella Ghazi

## 2018-06-14 ENCOUNTER — Ambulatory Visit (INDEPENDENT_AMBULATORY_CARE_PROVIDER_SITE_OTHER): Payer: Medicare Other | Admitting: Family Medicine

## 2018-06-14 ENCOUNTER — Ambulatory Visit
Admission: RE | Admit: 2018-06-14 | Discharge: 2018-06-14 | Disposition: A | Discharge status: Home / Self Care | Payer: Medicare Other | Source: Ambulatory Visit | Attending: Internal Medicine | Admitting: Internal Medicine

## 2018-06-14 ENCOUNTER — Encounter: Payer: Self-pay | Admitting: Family Medicine

## 2018-06-14 ENCOUNTER — Other Ambulatory Visit
Admission: RE | Admit: 2018-06-14 | Discharge: 2018-06-14 | Disposition: A | Discharge status: Home / Self Care | Payer: Medicare Other | Source: Ambulatory Visit | Attending: Family Medicine | Admitting: Family Medicine

## 2018-06-14 VITALS — BP 120/66 | HR 79 | Temp 98.1°F | Resp 15 | Ht 65.0 in | Wt 206.6 lb

## 2018-06-14 DIAGNOSIS — N6310 Unspecified lump in the right breast, unspecified quadrant: Secondary | ICD-10-CM

## 2018-06-14 DIAGNOSIS — D649 Anemia, unspecified: Secondary | ICD-10-CM

## 2018-06-14 DIAGNOSIS — D473 Essential (hemorrhagic) thrombocythemia: Secondary | ICD-10-CM | POA: Diagnosis not present

## 2018-06-14 DIAGNOSIS — R918 Other nonspecific abnormal finding of lung field: Secondary | ICD-10-CM | POA: Diagnosis not present

## 2018-06-14 DIAGNOSIS — R0902 Hypoxemia: Secondary | ICD-10-CM

## 2018-06-14 DIAGNOSIS — R109 Unspecified abdominal pain: Secondary | ICD-10-CM

## 2018-06-14 DIAGNOSIS — R590 Localized enlarged lymph nodes: Secondary | ICD-10-CM

## 2018-06-14 DIAGNOSIS — E038 Other specified hypothyroidism: Secondary | ICD-10-CM

## 2018-06-14 DIAGNOSIS — E039 Hypothyroidism, unspecified: Secondary | ICD-10-CM | POA: Insufficient documentation

## 2018-06-14 DIAGNOSIS — D75839 Thrombocytosis, unspecified: Secondary | ICD-10-CM

## 2018-06-14 DIAGNOSIS — J90 Pleural effusion, not elsewhere classified: Secondary | ICD-10-CM

## 2018-06-14 DIAGNOSIS — I272 Pulmonary hypertension, unspecified: Secondary | ICD-10-CM

## 2018-06-14 DIAGNOSIS — Z1239 Encounter for other screening for malignant neoplasm of breast: Secondary | ICD-10-CM

## 2018-06-14 LAB — COMPREHENSIVE METABOLIC PANEL
ALT: 13 U/L (ref 0–44)
AST: 21 U/L (ref 15–41)
Albumin: 4.2 g/dL (ref 3.5–5.0)
Alkaline Phosphatase: 57 U/L (ref 38–126)
Anion gap: 10 (ref 5–15)
BUN: 13 mg/dL (ref 8–23)
CO2: 28 mmol/L (ref 22–32)
Calcium: 9.3 mg/dL (ref 8.9–10.3)
Chloride: 103 mmol/L (ref 98–111)
Creatinine, Ser: 0.71 mg/dL (ref 0.44–1.00)
GFR calc Af Amer: >60 mL/min (ref >60)
GFR calc non Af Amer: >60 mL/min (ref >60)
Glucose, Bld: 100 mg/dL — ABNORMAL HIGH (ref 70–99)
Potassium: 4.1 mmol/L (ref 3.5–5.1)
Sodium: 141 mmol/L (ref 135–145)
Total Bilirubin: 0.7 mg/dL (ref 0.3–1.2)
Total Protein: 8 g/dL (ref 6.5–8.1)

## 2018-06-14 LAB — POCT URINALYSIS DIPSTICK
Bilirubin, UA: NEGATIVE
Blood, UA: NEGATIVE
Glucose, UA: NEGATIVE
Ketones, UA: NEGATIVE
Leukocytes, UA: NEGATIVE
Nitrite, UA: NEGATIVE
Odor: NORMAL
Protein, UA: NEGATIVE
Spec Grav, UA: 1.015 (ref 1.010–1.025)
Urobilinogen, UA: 0.2 E.U./dL
pH, UA: 6.5 (ref 5.0–8.0)

## 2018-06-14 LAB — CBC WITH DIFFERENTIAL/PLATELET
Abs Immature Granulocytes: 0.02 10*3/uL (ref 0.00–0.07)
Basophils Absolute: 0.1 10*3/uL (ref 0.0–0.1)
Basophils Relative: 1 %
Eosinophils Absolute: 0.2 10*3/uL (ref 0.0–0.5)
Eosinophils Relative: 3 %
HCT: 37.5 % (ref 36.0–46.0)
Hemoglobin: 11.7 g/dL — ABNORMAL LOW (ref 12.0–15.0)
Immature Granulocytes: 0 %
Lymphocytes Relative: 42 %
Lymphs Abs: 3 10*3/uL (ref 0.7–4.0)
MCH: 30.2 pg (ref 26.0–34.0)
MCHC: 31.2 g/dL (ref 30.0–36.0)
MCV: 96.9 fL (ref 80.0–100.0)
Monocytes Absolute: 0.7 10*3/uL (ref 0.1–1.0)
Monocytes Relative: 10 %
Neutro Abs: 3.1 10*3/uL (ref 1.7–7.7)
Neutrophils Relative %: 44 %
Platelets: 305 10*3/uL (ref 150–400)
RBC: 3.87 MIL/uL (ref 3.87–5.11)
RDW: 13.3 % (ref 11.5–15.5)
WBC: 7.1 10*3/uL (ref 4.0–10.5)
nRBC: 0 % (ref 0.0–0.2)

## 2018-06-14 LAB — LACTATE DEHYDROGENASE: LDH: 168 U/L (ref 98–192)

## 2018-06-14 LAB — PATHOLOGIST SMEAR REVIEW

## 2018-06-14 LAB — IRON AND TIBC
Iron: 102 ug/dL (ref 28–170)
Saturation Ratios: 27 % (ref 10.4–31.8)
TIBC: 375 ug/dL (ref 250–450)
UIBC: 273 ug/dL

## 2018-06-14 LAB — FOLATE: Folate: 14.6 ng/mL (ref >5.9)

## 2018-06-14 LAB — FERRITIN: Ferritin: 52 ng/mL (ref 11–307)

## 2018-06-14 LAB — TSH: TSH: 1.793 u[IU]/mL (ref 0.350–4.500)

## 2018-06-14 LAB — VITAMIN B12: Vitamin B-12: 399 pg/mL (ref 180–914)

## 2018-06-14 NOTE — Assessment & Plan Note (Signed)
Seeing pulmonologist 

## 2018-06-14 NOTE — Progress Notes (Signed)
BP 120/66   Pulse 79   Temp 98.1 F (36.7 C)   Resp 15   Ht 5\' 5"  (1.651 m)   Wt 206 lb 9.6 oz (93.7 kg)   SpO2 94%   BMI 34.38 kg/m    Subjective:    Patient ID: Linda Mejia, female    DOB: 1955-12-24, 63 y.o.   MRN: [id number]  HPI: Linda Mejia is a 63 y.o. female  Chief Complaint  Patient presents with  . Hospitalization Follow-up  . Flank Pain    Left side, onset yesterday    HPI She is here for hospitalization follow-up; admitted May 28, 2018; discharged with hypoxia as the main diagnosis   She just doesn't feel good; daughter checked her temperature, low 95 degrees; staying cold She is going to see Dr. Ashby Dawes later today, lung doctor; coughing somewhat, dry mouth; started yesterday; dry, not productive Note in discharge summary to make sure she follows up with lung doctor and pulmonary rehab She says she is not going to the emergency department; "I've been over there so damn much" Chest CT May 29, 2018: Lungs/Pleura: Patchy consolidation is identified throughout right upper lobe, right middle lobe and to a lesser degree in the right lower lobe, inferior left upper lobe. There are bilateral pleural effusions, moderate right, small left.  She had left flank pain yesterday; "hurts really bad, really bad" Had a cough yesterday, head feels like it's going to "blow off"; not a tension headache, not a sinus headache "I've been drinking the hell out of some water"  There was mention of a density in her right breast that requires a mammogram; she does not do self-breast exams monthly, just checks once in a while; nothing regular  Chest CT May 29, 2018: Question 5 cm mass in the retroareolar right breast. Further evaluation with diagnostic mammogram and possible ultrasound are Recommended.  Half-sister had breast cancer (same father); she does not feel anything in her breasts; she put off her mammogram in the past; she admits she is not good... putting it off  I  asked why she hasn't gotten in to see the lung doctor a week after leaving the hospital; she said she had an appt, but had to cancel it  Chest CT May 29, 2018: IMPRESSION: No pulmonary embolus.  Patchy consolidation in both lung, nonspecific multifocal pneumonia is not excluded. Moderate right pleural effusion and small left pleural effusion.  Mild enlarged mediastinal lymph nodes, which may be reactive lymph nodes.  Question 5 cm mass in the retroareolar right breast. Further evaluation with diagnostic mammogram and possible ultrasound are recommended.  No acute abnormality identified in the abdomen and pelvis.  Minimal fluid identified around gallbladder, nonspecific.   Electronically Signed   By: Abelardo Diesel M.D.   On: 05/29/2018 00:20   Her legs were swollen and hot at Dr. Laurelyn Sickle office; he was trying to get her to see his wife; there is a lot of unanswered questions about test results; she says swelling of the legs caused sepsis  CBC reviewed; anemia Platelets were elevated in the hospital as well  Patient declined referral to surgeon about the fluid around the gallbaldder  Depression screen [facility] 2/9 06/14/2018 06/07/2018 03/09/2018 01/22/2018 12/15/2017  Decreased Interest 0 0 1 3 0  Down, Depressed, Hopeless 3 1 0 3 0  PHQ - 2 Score 3 1 1 6  0  Altered sleeping 3 - 3 0 0  Tired, decreased energy 3 - 3  3 0  Change in appetite 0 - 1 0 0  Feeling bad or failure about yourself  0 - 1 3 0  Trouble concentrating 0 - 0 0 0  Moving slowly or fidgety/restless 0 - 0 0 0  Suicidal thoughts 0 - 0 0 0  PHQ-9 Score 9 - 9 12 0  Difficult doing work/chores Very difficult - Somewhat difficult Very difficult Not difficult at all  Some recent data might be hidden   Fall Risk  06/14/2018 05/10/2018 03/09/2018 01/22/2018 12/15/2017  Falls in the past year? 0 0 0 No No  Number falls in past yr: - 0 0 - -  Injury with Fall? - 0 - - -  Comment - - - - -  Risk for fall due to : - - -  - Impaired vision;History of fall(s);Medication side effect  Risk for fall due to: Comment - - - - wears eyeglasses; back pain; fell in tub    Relevant past medical, surgical, family and social history reviewed Past Medical History:  Diagnosis Date  . Abnormal ankle brachial index (ABI) 12/02/2016  . ADHD (attention deficit hyperactivity disorder)   . Anxiety   . Aortic atherosclerosis ([place]) 12/02/2016   July 2018  . Chronic back pain   . Coronary artery disease   . Herpes genitalis in women   . History of stomach ulcers    x7 this year. Bleeding  . Hypertension   . Prediabetes 12/02/2016  . Pulmonary hypertension ([place]) 03/09/2018   Echo Oct 2019   Past Surgical History:  Procedure Laterality Date  . APPENDECTOMY    . CARDIAC CATHETERIZATION    . CESAREAN SECTION    . COLON SURGERY     interception  . ESOPHAGOGASTRODUODENOSCOPY (EGD) WITH PROPOFOL N/A 09/21/2015   Procedure: ESOPHAGOGASTRODUODENOSCOPY (EGD) WITH PROPOFOL;  Surgeon: Lucilla Lame, MD;  Location: [facility] ENDOSCOPY;  Service: Endoscopy;  Laterality: N/A;  . ESOPHAGOGASTRODUODENOSCOPY (EGD) WITH PROPOFOL N/A 07/30/2016   Procedure: ESOPHAGOGASTRODUODENOSCOPY (EGD) WITH PROPOFOL;  Surgeon: Jonathon Bellows, MD;  Location: [facility] ENDOSCOPY;  Service: Endoscopy;  Laterality: N/A;  . ESOPHAGOGASTRODUODENOSCOPY (EGD) WITH PROPOFOL N/A 04/23/2018   Procedure: ESOPHAGOGASTRODUODENOSCOPY (EGD) WITH PROPOFOL;  Surgeon: Virgel Manifold, MD;  Location: [facility] ENDOSCOPY;  Service: Endoscopy;  Laterality: N/A;  . LEFT HEART CATH Right 01/25/2018   Procedure: Left Heart Cath and Coronary Angiography;  Surgeon: Dionisio David, MD;  Location: [facility];  Service: Cardiovascular;  Laterality: Right;  . LEFT HEART CATH AND CORONARY ANGIOGRAPHY Right 08/12/2016   Procedure: Left Heart Cath and Coronary Angiography;  Surgeon: Dionisio David, MD;  Location: [place] [facility];  Service: Cardiovascular;  Laterality: Right;  . TONSILLECTOMY     . TUBAL LIGATION     Family History  Problem Relation Age of Onset  . Diabetes Mother   . CVA Mother   . Heart disease Father   . Heart disease Sister   . Heart disease Brother   . Heart disease Sister   . Heart disease Sister   . Heart disease Sister   . Heart disease Brother   . Heart disease Brother   . Heart disease Brother    Social History   Tobacco Use  . Smoking status: Former Smoker    Packs/day: 1.00    Years: 30.00    Pack years: 30.00    Types: Cigarettes    Last attempt to quit: 2006    Years since quitting: 14.1  . Smokeless tobacco:  Never Used  . Tobacco comment: smoking cessation materials not required  Substance Use Topics  . Alcohol use: Not Currently    Alcohol/week: 0.0 standard drinks    Frequency: Never  . Drug use: No     Office Visit from 03/09/2018 in [facility]  AUDIT-C Score  1      Interim medical history since last visit reviewed. Allergies and medications reviewed  Review of Systems Per HPI unless specifically indicated above     Objective:    BP 120/66   Pulse 79   Temp 98.1 F (36.7 C)   Resp 15   Ht 5\' 5"  (1.651 m)   Wt 206 lb 9.6 oz (93.7 kg)   SpO2 94%   BMI 34.38 kg/m   Wt Readings from Last 3 Encounters:  06/14/18 206 lb 9.6 oz (93.7 kg)  06/01/18 213 lb 6.4 oz (96.8 kg)  05/16/18 216 lb 14.9 oz (98.4 kg)    Physical Exam Constitutional:      General: She is not in acute distress.    Appearance: She is well-developed. She is not diaphoretic.  HENT:     Head: Normocephalic and atraumatic.  Eyes:     General: No scleral icterus. Neck:     Thyroid: No thyromegaly.  Cardiovascular:     Rate and Rhythm: Normal rate and regular rhythm.     Heart sounds: Normal heart sounds. No murmur.  Pulmonary:     Effort: Pulmonary effort is normal. No respiratory distress.     Breath sounds: Normal breath sounds. No wheezing.  Abdominal:     General: Bowel sounds are normal. There is no  distension.     Palpations: Abdomen is soft.  Skin:    General: Skin is warm and dry.     Coloration: Skin is not pale.  Neurological:     Mental Status: She is alert.  Psychiatric:        Mood and Affect: Mood is not anxious, depressed or elated. Affect is angry. Affect is not flat.        Speech: She is communicative. Speech is not rapid and pressured or slurred.        Behavior: Behavior normal.        Thought Content: Thought content normal.        Judgment: Judgment normal.     Results for orders placed or performed in visit on 06/14/18  POCT urinalysis dipstick  Result Value Ref Range   Color, UA yellow    Clarity, UA clear    Glucose, UA Negative Negative   Bilirubin, UA neg    Ketones, UA neg    Spec Grav, UA 1.015 1.010 - 1.025   Blood, UA neg    pH, UA 6.5 5.0 - 8.0   Protein, UA Negative Negative   Urobilinogen, UA 0.2 0.2 or 1.0 E.U./dL   Nitrite, UA neg    Leukocytes, UA Negative Negative   Appearance clear    Odor normal       Assessment & Plan:   Problem List Items Addressed This Visit      Cardiovascular and Mediastinum   Pulmonary hypertension ([place])    Seeing pulmonologist      Relevant Orders   Angiotensin converting enzyme     Respiratory   Hypoxia    Seeing pulmonologist today        Endocrine   Subclinical hypothyroidism    Patient reports temp of 95; check CBC with diff,  check TSH; can be a sign of sepsis, but patient refuses to go to the ER; I will get stat labs today and she will see pulmonologist today      Relevant Orders   TSH     Other   Breast mass, right - Primary    Order diagnostic mammogram and ultrasound      Relevant Orders   MM DIAG BREAST TOMO BILATERAL   US BREAST LTD UNI RIGHT INC AXILLA   US BREAST LTD UNI LEFT INC AXILLA    Other Visit Diagnoses    Flank pain       Relevant Orders   POCT urinalysis dipstick (Completed)   Comprehensive metabolic panel   Pleural effusion, bilateral       Thrombocytosis  (HCC)       Relevant Orders   CBC with Differential/Platelet (Completed)   Anemia, unspecified type       Relevant Orders   CBC with Differential/Platelet (Completed)   Vitamin B12   Folate   Ferritin   Iron Binding Cap (TIBC)([facility])   Lymphadenopathy, thoracic       Relevant Orders   Lactate Dehydrogenase (LDH)   Angiotensin converting enzyme   ANA   Screening for breast cancer           Follow up plan: Return in about 3 days (around 06/17/2018).  An after-visit summary was printed and given to the patient at [place].  Please see the patient instructions which may contain other information and recommendations beyond what is mentioned above in the assessment and plan.  No orders of the defined types were placed in this encounter.   Orders Placed This Encounter  Procedures  . MM DIAG BREAST TOMO BILATERAL  . US BREAST LTD UNI RIGHT INC AXILLA  . US BREAST LTD UNI LEFT INC AXILLA  . CBC with Differential/Platelet  . Lactate Dehydrogenase (LDH)  . Angiotensin converting enzyme  . Vitamin B12  . Folate  . Ferritin  . Iron Binding Cap (TIBC)([facility])  . Comprehensive metabolic panel  . ANA  . TSH  . POCT urinalysis dipstick

## 2018-06-14 NOTE — Patient Instructions (Addendum)
Please go to the hospital for labs We'll get the breast studies done to work up the breast mass Please do see Dr. Ashby Dawes for follow-up Return to see me on Thursday If you feel worse, please go to the emergency room

## 2018-06-14 NOTE — Assessment & Plan Note (Signed)
Seeing pulmonologist today

## 2018-06-14 NOTE — Assessment & Plan Note (Signed)
Patient reports temp of 95; check CBC with diff, check TSH; can be a sign of sepsis, but patient refuses to go to the ER; I will get stat labs today and she will see pulmonologist today

## 2018-06-14 NOTE — Assessment & Plan Note (Signed)
Order diagnostic mammogram and ultrasound

## 2018-06-15 ENCOUNTER — Encounter: Payer: Self-pay | Admitting: Family Medicine

## 2018-06-15 ENCOUNTER — Telehealth: Payer: Self-pay | Admitting: Internal Medicine

## 2018-06-15 ENCOUNTER — Other Ambulatory Visit: Payer: Self-pay | Admitting: Family Medicine

## 2018-06-15 DIAGNOSIS — R768 Other specified abnormal immunological findings in serum: Secondary | ICD-10-CM

## 2018-06-15 LAB — ANA: Anti Nuclear Antibody(ANA): POSITIVE — AB

## 2018-06-15 LAB — ANGIOTENSIN CONVERTING ENZYME: Angiotensin-Converting Enzyme: 15 U/L (ref 14–82)

## 2018-06-15 NOTE — Telephone Encounter (Signed)
Pt notified she would have to call the Dr. Who ordered for results

## 2018-06-15 NOTE — Telephone Encounter (Signed)
Patient called to request a call back from Dr Sanda Klein or her nurse stated that she need to know what her Xray results are. Ph# [phone]

## 2018-06-15 NOTE — Progress Notes (Signed)
Please ADD ON multiple labs to hospital blood

## 2018-06-15 NOTE — Telephone Encounter (Signed)
Called and spoke to pt, who is requesting CXR results from 06/14/18. Pt has pending HFU for 06/17/18.  DR please advise. Thanks

## 2018-06-16 ENCOUNTER — Encounter: Payer: Self-pay | Admitting: Family Medicine

## 2018-06-16 ENCOUNTER — Telehealth: Payer: Self-pay | Admitting: Family Medicine

## 2018-06-16 ENCOUNTER — Institutional Professional Consult (permissible substitution): Payer: Medicare Other | Admitting: Internal Medicine

## 2018-06-16 ENCOUNTER — Other Ambulatory Visit
Admission: RE | Admit: 2018-06-16 | Discharge: 2018-06-16 | Disposition: A | Discharge status: Home / Self Care | Payer: Medicare Other | Source: Ambulatory Visit | Attending: Family Medicine | Admitting: Family Medicine

## 2018-06-16 DIAGNOSIS — R768 Other specified abnormal immunological findings in serum: Secondary | ICD-10-CM | POA: Diagnosis not present

## 2018-06-16 NOTE — Progress Notes (Signed)
* [facility]     Assessment and Plan:  Pneumonia with parapneumonic pleural effusion.  --On most recent imaging, pleural effusion has not recurred. CXR shows chronic changes, without significant infiltrate.  --Patient was reassured today that her most recent chest x-ray shows that her pneumonia is resolving as expected.  However it may take several more weeks for her to start to feel back to her usual self.  Return if symptoms worsen .   Date: 06/17/2018  MRN# [id number] Linda Mejia February 17, 1956   Linda Mejia is a 63 y.o. old female seen in follow up for chief complaint of  Chief Complaint  Patient presents with  . Hospitalization Follow-up    discharged 06/01/18, CXR, has a lot of pain  . Shortness of Breath    pain starts in lungs and goes down to waist, headache, swollen feet  . Chest Pain    feels like chest went backwards  . Headache    has headache  . Chills    Sunday, temp 95-97     HPI:   The patient is a 63 year old female, she was admitted from 05/16/2020 2/11 for pneumonia and sepsis requiring vasopressors and ICU admission.  Her history includesCAD, HTN, HLD, tobacco abuse, PUD, H.Pylori, ADHD, and anxiety.  She was found to have hypercapnic hypoxic respiratory failure, with strep pneumonia sepsis.  She was discharged and presented back with an exudative effusion, thought to represent parapneumonic effusion.  Patient underwent right-sided thoracentesis on 2/17 with range of 550 mL's.  Repeat chest x-ray shows bilateral scattered infiltrates without reaccumulation of pleural effusion. Review pleural fluid cultures are negative, pathology from fluid is negative for malignancy.  Today she says that she feels "like shit", she has aches in her chest, her hands "hurt like hell", she has a headache, she feels that people dont like her anymore because she has not been feeling well. She has gone back and seen Dr. Sanda Klein and has ordered several tests. She feels that  doctors are not helpful and she does not trust them anymore.   **Chest x-ray 06/14/2018>> imaging history reviewed, scattered interstitial changes bilaterally.  Previously seen infiltrates, pneumonia appears to have considerably improved. **CT chest 05/23/2018>> imaging personally reviewed, multifocal right-sided pneumonia, affecting the right upper lobe and right middle lobes, there is also a small right basal pleural effusion with area of atelectasis.  Medication:    Current Outpatient Medications:  .  acyclovir (ZOVIRAX) 400 MG tablet, take 1 tablet by mouth twice a day (Patient taking differently: Take 400 mg by mouth 2 (two) times daily. ), Disp: 60 tablet, Rfl: 5 .  amLODipine (NORVASC) 5 MG tablet, TAKE 1 TABLET(5 MG) BY MOUTH DAILY FOR BLOOD PRESSURE (Patient taking differently: Take 5 mg by mouth daily. ), Disp: 30 tablet, Rfl: 5 .  amphetamine-dextroamphetamine (ADDERALL) 30 MG tablet, Take 30 mg by mouth 2 (two) times daily. , Disp: , Rfl:  .  diazepam (VALIUM) 10 MG tablet, Take 1 tablet (10 mg total) by mouth every 8 (eight) hours as needed for anxiety., Disp: 30 tablet, Rfl: 0 .  fluticasone (FLONASE) 50 MCG/ACT nasal spray, Place 2 sprays into both nostrils as needed. (Patient taking differently: Place 2 sprays into both nostrils daily as needed for allergies. ), Disp: 16 g, Rfl: 11 .  Fluticasone-Salmeterol (ADVAIR DISKUS) 100-50 MCG/DOSE AEPB, Inhale 1 puff into the lungs 2 (two) times daily., Disp: 60 each, Rfl: 0 .  metoprolol tartrate (LOPRESSOR) 50 MG tablet, TAKE  1 TABLET BY MOUTH TWICE A DAY, Disp: 60 tablet, Rfl: 5 .  nitroGLYCERIN (NITROSTAT) 0.4 MG SL tablet, Place 1 tablet (0.4 mg total) under the tongue every 5 (five) minutes as needed for chest pain., Disp: 30 tablet, Rfl: 0 .  nystatin (MYCOSTATIN/NYSTOP) powder, Apply topically 2 (two) times daily., Disp: 15 g, Rfl: 0 .  PRALUENT 75 MG/ML SOPN, Inject 75 mLs as directed every 14 (fourteen) days., Disp: , Rfl:  .  Ascorbic  Acid (VITAMIN C) 100 MG tablet, Take 100 mg by mouth daily., Disp: , Rfl:  .  aspirin EC 81 MG tablet, Take 81 mg by mouth daily., Disp: , Rfl:  .  cholecalciferol (VITAMIN D) 1000 units tablet, Take 1,000 Units by mouth daily., Disp: , Rfl:  .  furosemide (LASIX) 20 MG tablet, Take 1 tablet (20 mg total) by mouth daily. (Patient not taking: Reported on 06/17/2018), Disp: 7 tablet, Rfl: 0 .  guaiFENesin-dextromethorphan (ROBITUSSIN DM) 100-10 MG/5ML syrup, Take 5 mLs by mouth every 4 (four) hours as needed for cough. (Patient not taking: Reported on 06/17/2018), Disp: 118 mL, Rfl: 0 .  Omega-3 Fatty Acids (FISH OIL) 1000 MG CAPS, Take 1 capsule by mouth daily., Disp: , Rfl:  .  pantoprazole (PROTONIX) 40 MG tablet, Take 1 tablet (40 mg total) by mouth 2 (two) times daily before a meal for 30 days., Disp: 60 tablet, Rfl: 0   Allergies:  Patient has no known allergies.   Review of Systems:  Constitutional: Feels well. Cardiovascular: Denies chest pain, exertional chest pain.  Pulmonary: Denies hemoptysis, pleuritic chest pain.   The remainder of systems were reviewed and were found to be negative other than what is documented in the HPI.    Physical Examination:   VS: BP (!) 144/80 (BP Location: Left Arm, Cuff Size: Normal)   Pulse 96   Ht 5\' 5"  (1.651 m)   Wt 207 lb 12.8 oz (94.3 kg)   SpO2 94%   BMI 34.58 kg/m   General Appearance: No distress  Neuro:without focal findings, mental status, speech normal, alert and oriented HEENT: PERRLA, EOM intact Pulmonary: No wheezing, No rales  CardiovascularNormal S1,S2.  No m/r/g.  Abdomen: Benign, Soft, non-tender, No masses Renal:  No costovertebral tenderness  GU:  No performed at this time. Endoc: No evident thyromegaly, no signs of acromegaly or Cushing features Skin:   warm, no rashes, no ecchymosis  Extremities: normal, no cyanosis, clubbing.     LABORATORY PANEL:   CBC Recent Labs  Lab 06/14/18 1224  WBC 7.1  HGB 11.7*    HCT 37.5  PLT 305   ------------------------------------------------------------------------------------------------------------------  Chemistries  Recent Labs  Lab 06/14/18 1224  NA 141  K 4.1  CL 103  CO2 28  GLUCOSE 100*  BUN 13  CREATININE 0.71  CALCIUM 9.3  AST 21  ALT 13  ALKPHOS 57  BILITOT 0.7   ------------------------------------------------------------------------------------------------------------------  Cardiac Enzymes No results for input(s): TROPONINI in the last 168 hours. ------------------------------------------------------------  RADIOLOGY:   No results found for this or any previous visit. Results for orders placed during the hospital encounter of 06/14/18  DG Chest 2 View   Narrative CLINICAL DATA:  63 y/o female reports left sided CP and SOB x 3 days after feeling better from pneumonia. Pt reports getting fluid drawn off her right lung while in the hospital. Pt was in the hospital from feb. 1st- 19th. Former smoker. History of COPD, CAD, hypertension.  EXAM: CHEST - 2 VIEW  COMPARISON:  05/31/2018 and chest CT on 05/28/2018  FINDINGS: Heart size is normal. There is atherosclerotic calcification of the thoracic aorta. There is persistent patchy opacity throughout the RIGHT lung, showing improvement compared to previous chest CT. There is perihilar peribronchial thickening. Focal LEFT LOWER lobe atelectasis or resolving infiltrates.  IMPRESSION: Improvement in aeration bilaterally.  Persistent patchy densities in the RIGHT lung and LEFT LOWER lobe.   Electronically Signed   By: Nolon Nations M.D.   On: 06/14/2018 11:01    ------------------------------------------------------------------------------------------------------------------  Thank  you for allowing [facility] to assist in the care of your patient. Our recommendations are noted above.  Please contact us if we can be of further  service.   Marda Stalker, M.D., F.C.C.P.  Board Certified in Internal Medicine, Pulmonary Medicine, [place], and Sleep Medicine.  [facility] Office Number: [phone]  06/17/2018

## 2018-06-16 NOTE — Telephone Encounter (Signed)
My request for Praluent was DENIED Please have her contact her cardiologist for this medicine The insurance company may not cover for family medicine, but they may cover if prescribed by cardiologist

## 2018-06-16 NOTE — Telephone Encounter (Signed)
Will discuss with pt at appt tomorrow.

## 2018-06-16 NOTE — Telephone Encounter (Signed)
lmtcb x1 for pt. 

## 2018-06-17 ENCOUNTER — Ambulatory Visit (INDEPENDENT_AMBULATORY_CARE_PROVIDER_SITE_OTHER): Payer: Medicare Other | Admitting: Family Medicine

## 2018-06-17 ENCOUNTER — Encounter: Payer: Self-pay | Admitting: Internal Medicine

## 2018-06-17 ENCOUNTER — Ambulatory Visit (INDEPENDENT_AMBULATORY_CARE_PROVIDER_SITE_OTHER): Payer: Medicare Other | Admitting: Internal Medicine

## 2018-06-17 ENCOUNTER — Encounter: Payer: Self-pay | Admitting: Family Medicine

## 2018-06-17 ENCOUNTER — Ambulatory Visit: Admission: RE | Admit: 2018-06-17 | Payer: Medicare Other | Source: Ambulatory Visit

## 2018-06-17 ENCOUNTER — Ambulatory Visit: Payer: Medicare Other

## 2018-06-17 ENCOUNTER — Other Ambulatory Visit: Payer: Self-pay

## 2018-06-17 ENCOUNTER — Telehealth: Payer: Self-pay | Admitting: Family Medicine

## 2018-06-17 VITALS — BP 144/80 | HR 96 | Ht 65.0 in | Wt 207.8 lb

## 2018-06-17 VITALS — BP 136/80 | HR 81 | Temp 97.9°F | Ht 65.0 in | Wt 208.4 lb

## 2018-06-17 DIAGNOSIS — M654 Radial styloid tenosynovitis [de Quervain]: Secondary | ICD-10-CM | POA: Diagnosis not present

## 2018-06-17 DIAGNOSIS — J918 Pleural effusion in other conditions classified elsewhere: Secondary | ICD-10-CM

## 2018-06-17 DIAGNOSIS — J90 Pleural effusion, not elsewhere classified: Secondary | ICD-10-CM | POA: Diagnosis not present

## 2018-06-17 DIAGNOSIS — J189 Pneumonia, unspecified organism: Secondary | ICD-10-CM

## 2018-06-17 DIAGNOSIS — F418 Other specified anxiety disorders: Secondary | ICD-10-CM

## 2018-06-17 DIAGNOSIS — F410 Panic disorder [episodic paroxysmal anxiety] without agoraphobia: Secondary | ICD-10-CM

## 2018-06-17 DIAGNOSIS — R9389 Abnormal findings on diagnostic imaging of other specified body structures: Secondary | ICD-10-CM

## 2018-06-17 DIAGNOSIS — R590 Localized enlarged lymph nodes: Secondary | ICD-10-CM | POA: Diagnosis not present

## 2018-06-17 DIAGNOSIS — R932 Abnormal findings on diagnostic imaging of liver and biliary tract: Secondary | ICD-10-CM

## 2018-06-17 DIAGNOSIS — N6310 Unspecified lump in the right breast, unspecified quadrant: Secondary | ICD-10-CM

## 2018-06-17 LAB — MISC LABCORP TEST (SEND OUT): Labcorp test code: 12518

## 2018-06-17 LAB — ANTI-SMITH ANTIBODY: ENA SM Ab Ser-aCnc: <0.2 AI (ref 0.0–0.9)

## 2018-06-17 LAB — CARDIOLIPIN ANTIBODIES, IGG, IGM, IGA
Anticardiolipin IgA: <9 APL U/mL (ref 0–11)
Anticardiolipin IgG: <9 GPL U/mL (ref 0–14)
Anticardiolipin IgM: <9 MPL U/mL (ref 0–12)

## 2018-06-17 LAB — CYCLIC CITRUL PEPTIDE ANTIBODY, IGG/IGA: CCP Antibodies IgG/IgA: 5 units (ref 0–19)

## 2018-06-17 LAB — LUPUS ANTICOAGULANT PANEL
DRVVT: 29.7 s (ref 0.0–47.0)
PTT Lupus Anticoagulant: 25.8 s (ref 0.0–51.9)

## 2018-06-17 LAB — HISTONE ANTIBODIES, IGG, BLOOD: DNA-Histone: 0.3 Units (ref 0.0–0.9)

## 2018-06-17 NOTE — Progress Notes (Signed)
BP 136/80   Pulse 81   Temp 97.9 F (36.6 C)   Ht 5\' 5"  (1.651 m)   Wt 208 lb 6.4 oz (94.5 kg)   SpO2 93%   BMI 34.68 kg/m    Subjective:    Patient ID: Linda Mejia, female    DOB: [birth year], 63 y.o.   MRN: [id number]  HPI: Linda Mejia is a 63 y.o. female  Chief Complaint  Patient presents with  . Follow-up    HPI Patient is here for f/u She had testing done by her lung doctor earlier today and she goes to see him today at 3 pm She had a positive ANA She had issues with her legs but she canceled the appointment with the dermatologist (I put in that referral in December) Her feet have started getting red again; her palms are red too; feet and legs get hot Patient feels like she has fluid on her lungs, but she doesn't want to go to the ER She doesn't remember the ambulance ride, the ICU days, it's all blank to her she says She felt ike a liter of fluid was on her right lung; pleural effusion drained; felt like she couldn't catch her breath briefly last night and it scared her; right now, "I'm short of breath and tired"; she has discomfort from her bra down the left side down to the waist, then it goes around the center of the back, and around to the right side Coughing a little bit; this morning, threw up a little bit of water from burping this morning; occasional acid reflux She had abnormal breast finding on one of her scans when she was in the hospital She has not had her diagnostic imaging yet, though (just ordered on March 2nd) She has left wrist pain; has been to [facility]; had cortisone shot, but actually worse since then; got a brace; going to see her next week Tues or Wed She is going to see a Social worker; she has a Teacher, music, Dr. Kasandra Knudsen; she will make an appointment with him "but first I want to talk to a therapist"; she has been seeing Dr. Kasandra Knudsen for years; she knows him well; she does not want to change up, good relationship She apologized for the cussing, "damn" and "shit"  and "hell" today during our visit in her conversation She does not feel depressed, "nope, I'm not depressed", but she says she doesn't feel like doing anything; did wash her hair this morning   CT scan from May 29, 2018: IMPRESSION: No pulmonary embolus.  Patchy consolidation in both lung, nonspecific multifocal pneumonia is not excluded. Moderate right pleural effusion and small left pleural effusion.  Mild enlarged mediastinal lymph nodes, which may be reactive lymph nodes.  Question 5 cm mass in the retroareolar right breast. Further evaluation with diagnostic mammogram and possible ultrasound are recommended.  No acute abnormality identified in the abdomen and pelvis.  Minimal fluid identified around gallbladder, nonspecific.   Electronically Signed   By: Abelardo Diesel M.D.   On: 05/29/2018 00:20  Thoracentesis May 21, 2018: FINDINGS: A total of approximately 550 mL of clear yellow fluid was removed. Samples were sent to the laboratory as requested by the clinical team.  IMPRESSION: Successful ultrasound guided right thoracentesis yielding 550 mL of pleural fluid.  Read by Candiss Norse, PA-C   Electronically Signed   By: Aletta Edouard M.D.   On: 05/31/2018 10:01  Depression screen [facility] 2/9 06/17/2018 06/14/2018 06/07/2018 03/09/2018 01/22/2018  Decreased Interest 0 0 0 1 3  Down, Depressed, Hopeless 0 3 1 0 3  PHQ - 2 Score 0 3 1 1 6   Altered sleeping 0 3 - 3 0  Tired, decreased energy 0 3 - 3 3  Change in appetite 0 0 - 1 0  Feeling bad or failure about yourself  0 0 - 1 3  Trouble concentrating 0 0 - 0 0  Moving slowly or fidgety/restless 0 0 - 0 0  Suicidal thoughts 0 0 - 0 0  PHQ-9 Score 0 9 - 9 12  Difficult doing work/chores Not difficult at all Very difficult - Somewhat difficult Very difficult  Some recent data might be hidden   Fall Risk  06/17/2018 06/14/2018 05/10/2018 03/09/2018 01/22/2018  Falls in the past year? 0 0 0 0 No    Number falls in past yr: - - 0 0 -  Injury with Fall? - - 0 - -  Comment - - - - -  Risk for fall due to : - - - - -  Risk for fall due to: Comment - - - - -    Relevant past medical, surgical, family and social history reviewed Past Medical History:  Diagnosis Date  . Abnormal ankle brachial index (ABI) 12/02/2016  . ADHD (attention deficit hyperactivity disorder)   . Anxiety   . Aortic atherosclerosis ([place]) 12/02/2016   July 2018  . Chronic back pain   . Coronary artery disease   . Herpes genitalis in women   . History of stomach ulcers    x7 this year. Bleeding  . Hypertension   . Prediabetes 12/02/2016  . Pulmonary hypertension ([place]) 03/09/2018   Echo Oct 2019   Past Surgical History:  Procedure Laterality Date  . APPENDECTOMY    . CARDIAC CATHETERIZATION    . CESAREAN SECTION    . COLON SURGERY     interception  . ESOPHAGOGASTRODUODENOSCOPY (EGD) WITH PROPOFOL N/A 09/21/2015   Procedure: ESOPHAGOGASTRODUODENOSCOPY (EGD) WITH PROPOFOL;  Surgeon: Lucilla Lame, MD;  Location: [facility] ENDOSCOPY;  Service: Endoscopy;  Laterality: N/A;  . ESOPHAGOGASTRODUODENOSCOPY (EGD) WITH PROPOFOL N/A 07/30/2016   Procedure: ESOPHAGOGASTRODUODENOSCOPY (EGD) WITH PROPOFOL;  Surgeon: Jonathon Bellows, MD;  Location: [facility] ENDOSCOPY;  Service: Endoscopy;  Laterality: N/A;  . ESOPHAGOGASTRODUODENOSCOPY (EGD) WITH PROPOFOL N/A 04/23/2018   Procedure: ESOPHAGOGASTRODUODENOSCOPY (EGD) WITH PROPOFOL;  Surgeon: Virgel Manifold, MD;  Location: [facility] ENDOSCOPY;  Service: Endoscopy;  Laterality: N/A;  . LEFT HEART CATH Right 01/25/2018   Procedure: Left Heart Cath and Coronary Angiography;  Surgeon: Dionisio David, MD;  Location: [place] [facility];  Service: Cardiovascular;  Laterality: Right;  . LEFT HEART CATH AND CORONARY ANGIOGRAPHY Right 08/12/2016   Procedure: Left Heart Cath and Coronary Angiography;  Surgeon: Dionisio David, MD;  Location: [place] [facility];  Service: Cardiovascular;  Laterality:  Right;  . TONSILLECTOMY    . TUBAL LIGATION     Family History  Problem Relation Age of Onset  . Diabetes Mother   . CVA Mother   . Heart disease Father   . Heart disease Sister   . Heart disease Brother   . Heart disease Sister   . Heart disease Sister   . Heart disease Sister   . Heart disease Brother   . Heart disease Brother   . Heart disease Brother    Social History   Tobacco Use  . Smoking status: Former Smoker    Packs/day: 1.00    Years:  30.00    Pack years: 30.00    Types: Cigarettes    Last attempt to quit: 2006    Years since quitting: 14.1  . Smokeless tobacco: Never Used  . Tobacco comment: smoking cessation materials not required  Substance Use Topics  . Alcohol use: Not Currently    Alcohol/week: 0.0 standard drinks    Frequency: Never  . Drug use: No     Office Visit from 06/17/2018 in [facility]  AUDIT-C Score  0      Interim medical history since last visit reviewed. Allergies and medications reviewed  Review of Systems Per HPI unless specifically indicated above     Objective:    BP 136/80   Pulse 81   Temp 97.9 F (36.6 C)   Ht 5\' 5"  (1.651 m)   Wt 208 lb 6.4 oz (94.5 kg)   SpO2 93%   BMI 34.68 kg/m   Wt Readings from Last 3 Encounters:  06/17/18 207 lb 12.8 oz (94.3 kg)  06/17/18 208 lb 6.4 oz (94.5 kg)  06/14/18 206 lb 9.6 oz (93.7 kg)    Physical Exam Constitutional:      General: She is not in acute distress.    Appearance: She is well-developed. She is obese. She is not diaphoretic.  HENT:     Head: Normocephalic and atraumatic.  Eyes:     General: No scleral icterus. Neck:     Thyroid: No thyromegaly.  Cardiovascular:     Rate and Rhythm: Normal rate and regular rhythm.     Heart sounds: Normal heart sounds. No murmur.  Pulmonary:     Effort: Pulmonary effort is normal. No respiratory distress.     Breath sounds: Normal breath sounds. No wheezing.  Abdominal:     General: Bowel sounds are  normal. There is no distension.     Palpations: Abdomen is soft.     Tenderness: There is abdominal tenderness in the right upper quadrant.  Musculoskeletal:     Left wrist: She exhibits tenderness.       Arms:     Comments: Positive Finklestein's test on the LEFT  Skin:    General: Skin is warm and dry.     Coloration: Skin is not pale.     Comments: Skin on the palms erythematous around the edges; skin on the feet erythematous and blanchable  Neurological:     Mental Status: She is alert.  Psychiatric:        Mood and Affect: Mood is anxious. Mood is not depressed. Affect is angry. Affect is not flat or tearful.        Speech: Speech is not rapid and pressured.        Behavior: Behavior is agitated (cursing at times).       Assessment & Plan:   Problem List Items Addressed This Visit      Other   Episodic paroxysmal anxiety disorder    Patient seeing psychiatrist; I encouraged her to contact her psychiatrist today, and she is working on getting in to see a therapist (she is scheduling that); she is noticeably upset today and cursing, quite anxious; spoke highly of Dr. Bridgett Larsson, but mentioned lobbying a complaint against another person at the hospital; supportive listening provided; asked if there was anything else she thought I could or should be doing for her; she thanked me for listening, tired of feeling bad and wants answers but if nothing wrong will work with her therapist; I shared  [email or web address] as a site she could use to read about the various tests I've ordered and I encouraged her to send me questions or comments about her symptoms or other problems      Breast mass, right    Worked with staff today to get her imaging tests ordered ASAP; large concerning mass found on CT scan during her hospital stay; patient is aware       Other Visit Diagnoses    Abnormal CT scan, gallbladder    -  Primary   noted on CT scan from previous hospital records, with RUQ tenderness on  exam today; I ordered a STAT RUQ [place] today   Relevant Orders   US ABDOMEN LIMITED RUQ   De Quervain's tenosynovitis, left       managed by ortho   Mediastinal lymphadenopathy       noted on chest CT; large breast mass, and diagnostic imaging ordered; seeing lung specialist today; ACE negative   Abnormal CT of the chest       patient reports having imaging earlier today and then going to see pulm later today for evaluation of this   Anxiety about health       supportive listening; see other comments under anxiety d/o; seeing Dr. Kasandra Knudsen       Follow up plan: Return in about 3 weeks (around 07/08/2018) for follow-up visit with Dr. Sanda Klein.  An after-visit summary was printed and given to the patient at [facility].  Please see the patient instructions which may contain other information and recommendations beyond what is mentioned above in the assessment and plan.  No orders of the defined types were placed in this encounter.   Orders Placed This Encounter  Procedures  . US ABDOMEN LIMITED RUQ

## 2018-06-17 NOTE — Telephone Encounter (Signed)
Called dr. Serena Colonel nurse, he already prescribes last refill was feb

## 2018-06-17 NOTE — Patient Instructions (Addendum)
Check out this web site: [email or web address]  Please do call your psychiatrist about your symptoms Please do get the breast imaging as soon as possible; if you have not heard about the appointments by tomorrow at lunch, call this office We'll get the ultrasound of your gallbladder today Write or call with any concerns, but be aware that any MyChart messages may not be seen until Monday

## 2018-06-17 NOTE — Assessment & Plan Note (Signed)
Worked with staff today to get her imaging tests ordered ASAP; large concerning mass found on CT scan during her hospital stay; patient is aware

## 2018-06-17 NOTE — Assessment & Plan Note (Signed)
Patient seeing psychiatrist; I encouraged her to contact her psychiatrist today, and she is working on getting in to see a therapist (she is scheduling that); she is noticeably upset today and cursing, quite anxious; spoke highly of Dr. Bridgett Larsson, but mentioned lobbying a complaint against another person at the hospital; supportive listening provided; asked if there was anything else she thought I could or should be doing for her; she thanked me for listening, tired of feeling bad and wants answers but if nothing wrong will work with her therapist; I shared [email or web address] as a site she could use to read about the various tests I've ordered and I encouraged her to send me questions or comments about her symptoms or other problems

## 2018-06-17 NOTE — Telephone Encounter (Signed)
Thank you for letting me know

## 2018-06-17 NOTE — Telephone Encounter (Signed)
Copied from [place] [id number]. Topic: Quick Communication - See Telephone Encounter >> Jun 17, 2018 12:36 PM Bea Graff, NT wrote: CRM for notification. See Telephone encounter for: 06/17/18. Pt calling to let Dr. Sanda Klein know that she is going to go to the ER because of her stomach and SOB.

## 2018-06-18 ENCOUNTER — Encounter: Payer: Self-pay | Admitting: Family Medicine

## 2018-06-18 ENCOUNTER — Telehealth: Payer: Self-pay | Admitting: Family Medicine

## 2018-06-18 NOTE — Telephone Encounter (Signed)
I see that services were ordered by hospitalist If patient does not want home health nursing, that is up to her and her choice Discontinue home health nurse per patient request

## 2018-06-18 NOTE — Telephone Encounter (Signed)
Tiffany with [facility] notified.

## 2018-06-18 NOTE — Telephone Encounter (Signed)
Copied from [place] [id number]. Topic: Quick Communication - See Telephone Encounter >> Jun 18, 2018 11:27 AM Blase Mess A wrote: CRM for notification. See Telephone encounter for: 06/18/18.  Tiffany Turner calling from [facility] states that the patient came to see Dr. Sanda Klein yesterday. And everything is fine. She is interested in discontinuing [place]. [phone] Requesting a calling back because orders need to be placed. Ok to leave verbal on VM

## 2018-06-18 NOTE — Telephone Encounter (Signed)
Pt seen in office on 06/17/18 and results were discussed with pt.  Will close encounter, as nothing further is needed.

## 2018-06-19 ENCOUNTER — Other Ambulatory Visit: Payer: Self-pay | Admitting: Gastroenterology

## 2018-06-19 MED ORDER — PANTOPRAZOLE SODIUM 40 MG PO TBEC: 40.0000 mg | DELAYED_RELEASE_TABLET | Freq: Two times a day (BID) | ORAL | 0 refills | Status: DC

## 2018-06-21 ENCOUNTER — Ambulatory Visit
Admission: RE | Admit: 2018-06-21 | Discharge: 2018-06-21 | Disposition: A | Discharge status: Home / Self Care | Payer: Medicare Other | Source: Ambulatory Visit | Attending: Family Medicine | Admitting: Family Medicine

## 2018-06-21 DIAGNOSIS — R932 Abnormal findings on diagnostic imaging of liver and biliary tract: Secondary | ICD-10-CM

## 2018-06-21 DIAGNOSIS — N6310 Unspecified lump in the right breast, unspecified quadrant: Secondary | ICD-10-CM

## 2018-06-21 DIAGNOSIS — R928 Other abnormal and inconclusive findings on diagnostic imaging of breast: Secondary | ICD-10-CM | POA: Diagnosis not present

## 2018-06-21 DIAGNOSIS — R922 Inconclusive mammogram: Secondary | ICD-10-CM | POA: Diagnosis not present

## 2018-06-21 DIAGNOSIS — R1011 Right upper quadrant pain: Secondary | ICD-10-CM | POA: Diagnosis not present

## 2018-06-22 DIAGNOSIS — R2232 Localized swelling, mass and lump, left upper limb: Secondary | ICD-10-CM | POA: Diagnosis not present

## 2018-06-22 DIAGNOSIS — M65312 Trigger thumb, left thumb: Secondary | ICD-10-CM | POA: Diagnosis not present

## 2018-06-23 ENCOUNTER — Encounter: Payer: Self-pay | Admitting: Family Medicine

## 2018-06-23 DIAGNOSIS — M654 Radial styloid tenosynovitis [de Quervain]: Secondary | ICD-10-CM

## 2018-06-23 DIAGNOSIS — R768 Other specified abnormal immunological findings in serum: Secondary | ICD-10-CM

## 2018-06-23 DIAGNOSIS — M170 Bilateral primary osteoarthritis of knee: Secondary | ICD-10-CM

## 2018-06-24 NOTE — Telephone Encounter (Signed)
Copied from [place] [id number]. Topic: General - Inquiry >> Jun 23, 2018  4:42 PM Reyne Dumas L wrote: Reason for CRM:   Pt wants to know if Dr. Sanda Klein has gotten all of her lab results from the hospital.

## 2018-06-25 NOTE — Telephone Encounter (Signed)
Referred to rheum for evaluation

## 2018-06-28 ENCOUNTER — Ambulatory Visit: Payer: Medicare Other | Admitting: Gastroenterology

## 2018-06-29 DIAGNOSIS — M791 Myalgia, unspecified site: Secondary | ICD-10-CM | POA: Diagnosis not present

## 2018-06-29 DIAGNOSIS — I2510 Atherosclerotic heart disease of native coronary artery without angina pectoris: Secondary | ICD-10-CM | POA: Diagnosis not present

## 2018-06-29 DIAGNOSIS — R7303 Prediabetes: Secondary | ICD-10-CM | POA: Diagnosis not present

## 2018-06-29 DIAGNOSIS — I10 Essential (primary) hypertension: Secondary | ICD-10-CM | POA: Diagnosis not present

## 2018-06-29 DIAGNOSIS — G8929 Other chronic pain: Secondary | ICD-10-CM | POA: Diagnosis not present

## 2018-06-29 DIAGNOSIS — Z9981 Dependence on supplemental oxygen: Secondary | ICD-10-CM | POA: Diagnosis not present

## 2018-06-29 DIAGNOSIS — Z87440 Personal history of urinary (tract) infections: Secondary | ICD-10-CM | POA: Diagnosis not present

## 2018-06-29 DIAGNOSIS — I272 Pulmonary hypertension, unspecified: Secondary | ICD-10-CM | POA: Diagnosis not present

## 2018-06-29 DIAGNOSIS — R229 Localized swelling, mass and lump, unspecified: Secondary | ICD-10-CM | POA: Diagnosis not present

## 2018-06-29 DIAGNOSIS — I700 Atherosclerosis of aorta: Secondary | ICD-10-CM | POA: Diagnosis not present

## 2018-06-29 DIAGNOSIS — J9601 Acute respiratory failure with hypoxia: Secondary | ICD-10-CM | POA: Diagnosis not present

## 2018-06-29 DIAGNOSIS — M545 Low back pain: Secondary | ICD-10-CM | POA: Diagnosis not present

## 2018-06-29 DIAGNOSIS — J181 Lobar pneumonia, unspecified organism: Secondary | ICD-10-CM | POA: Diagnosis not present

## 2018-06-30 DIAGNOSIS — J449 Chronic obstructive pulmonary disease, unspecified: Secondary | ICD-10-CM | POA: Diagnosis not present

## 2018-07-01 ENCOUNTER — Telehealth: Payer: Self-pay | Admitting: Gastroenterology

## 2018-07-01 NOTE — Telephone Encounter (Signed)
Left vm for pt to cancel  apt for 07/08/18 and r/s for 3 month out

## 2018-07-05 DIAGNOSIS — R2232 Localized swelling, mass and lump, left upper limb: Secondary | ICD-10-CM | POA: Diagnosis not present

## 2018-07-05 DIAGNOSIS — M65312 Trigger thumb, left thumb: Secondary | ICD-10-CM | POA: Diagnosis not present

## 2018-07-06 DIAGNOSIS — M65312 Trigger thumb, left thumb: Secondary | ICD-10-CM | POA: Diagnosis not present

## 2018-07-08 ENCOUNTER — Ambulatory Visit: Payer: Medicare Other | Admitting: Gastroenterology

## 2018-07-09 ENCOUNTER — Ambulatory Visit: Payer: Medicare Other | Admitting: Family Medicine

## 2018-07-20 ENCOUNTER — Telehealth: Payer: Self-pay | Admitting: Gastroenterology

## 2018-07-20 NOTE — Telephone Encounter (Signed)
Received fax from [employer] [address] [phone] for rx PANTOPRAZOLE 40 MG TABLETS QTY 60

## 2018-07-22 ENCOUNTER — Other Ambulatory Visit: Payer: Self-pay

## 2018-07-22 ENCOUNTER — Telehealth: Payer: Self-pay | Admitting: Gastroenterology

## 2018-07-22 MED ORDER — PANTOPRAZOLE SODIUM 40 MG PO TBEC: 40.0000 mg | DELAYED_RELEASE_TABLET | Freq: Every day | ORAL | 0 refills | Status: DC

## 2018-07-22 NOTE — Telephone Encounter (Signed)
I called & l/m for patient to call & schedule a 1-2 month telemed visit with dr Bonna Gains per Jackelyn Poling.

## 2018-07-27 ENCOUNTER — Ambulatory Visit: Payer: Medicare Other | Admitting: Family Medicine

## 2018-07-30 ENCOUNTER — Telehealth: Payer: Self-pay | Admitting: Family Medicine

## 2018-07-30 NOTE — Telephone Encounter (Signed)
Copied from [place] [id number]. Topic: Quick Communication - See Telephone Encounter >> Jul 30, 2018 12:39 PM Burchel, Abbi R wrote: CRM for notification. See Telephone encounter for: 07/30/18.  Pt states she is having continued pain and swelling in her left wrist.  Pt states she had MRI done through [facility], and she has gotten a shot before and it did not help pt states she felt worse afterward. Please call pt to advise next steps.    Pt: [phone]

## 2018-07-30 NOTE — Telephone Encounter (Signed)
Pt.notified

## 2018-07-30 NOTE — Telephone Encounter (Signed)
I'm sorry to hear that she is still having symptoms I am going to encourage her to contact the doctor who is treating her for this, whoever gave her the injection and ordered the MRI; they will direct the next steps

## 2018-08-04 DIAGNOSIS — M25532 Pain in left wrist: Secondary | ICD-10-CM | POA: Diagnosis not present

## 2018-08-09 DIAGNOSIS — Z79899 Other long term (current) drug therapy: Secondary | ICD-10-CM | POA: Diagnosis not present

## 2018-08-11 ENCOUNTER — Encounter: Payer: Self-pay | Admitting: *Deleted

## 2018-08-11 ENCOUNTER — Encounter: Payer: Self-pay | Admitting: Family Medicine

## 2018-08-12 ENCOUNTER — Other Ambulatory Visit: Payer: Self-pay | Admitting: Nurse Practitioner

## 2018-08-12 DIAGNOSIS — R7303 Prediabetes: Secondary | ICD-10-CM

## 2018-08-12 DIAGNOSIS — H543 Unqualified visual loss, both eyes: Secondary | ICD-10-CM

## 2018-08-19 ENCOUNTER — Other Ambulatory Visit: Payer: Self-pay | Admitting: Family Medicine

## 2018-08-19 ENCOUNTER — Ambulatory Visit (INDEPENDENT_AMBULATORY_CARE_PROVIDER_SITE_OTHER): Payer: Medicare Other | Admitting: Gastroenterology

## 2018-08-19 DIAGNOSIS — K31A0 Gastric intestinal metaplasia, unspecified: Secondary | ICD-10-CM

## 2018-08-19 DIAGNOSIS — Z8619 Personal history of other infectious and parasitic diseases: Secondary | ICD-10-CM | POA: Diagnosis not present

## 2018-08-19 DIAGNOSIS — K3189 Other diseases of stomach and duodenum: Secondary | ICD-10-CM | POA: Diagnosis not present

## 2018-08-19 NOTE — Progress Notes (Signed)
Linda Antigua, MD [address]  [place]  [place], [place] [address]  Main: [phone]  Fax: [phone]   Primary Care Physician: Arnetha Courser, MD  Virtual Visit via Telephone Note  I connected with patient on 08/19/18 at  9:00 AM EDT by telephone and verified that I am speaking with the correct person using two identifiers.   I discussed the limitations, risks, security and privacy concerns of performing an evaluation and management service by telephone and the availability of in person appointments. I also discussed with the patient that there may be a patient responsible charge related to this service. The patient expressed understanding and agreed to proceed.  Location of Patient: Home Location of Provider: Home Persons involved: Patient and provider only   History of Present Illness: CC: History of gastric ulcers  HPI: Linda Mejia is a 63 y.o. female with history of H. pylori, gastric ulcers, being seen for follow-up.  Patient had a positive H. pylori breath test last year, and was prescribed triple therapy by primary care provider.  She underwent EGD in January 2020 with gastric biopsies negative for H. pylori, but showing intestinal metaplasia.  Grade B esophagitis was also seen at that time.  The patient denies abdominal or flank pain, anorexia, nausea or vomiting, dysphagia, change in bowel habits or black or bloody stools or weight loss.  Patient was admitted in February 2020 with hypoxia and pleural effusions at the time and is seeing pulmonary for the same at this time.  She states she is doing well and asymptomatic from this perspective at this time.  Due to the current pandemic and her comorbidities she is avoiding going out of the house.  On previous clinic visit, patient had refused screening colonoscopy and stated that she would follow-up with primary care provider to get stool testing done as she has done Cologuard testing before.  No family history  of colon cancer.  Denies any NSAID use.  Previous history:  Last EGD April 2018 by Dr. Vicente Males for hematemesis 3 nonbleeding gastric antral ulcers reported, largest 6 mm in size Gastritis and duodenitis reported  June 2017 EGD by Dr. Allen Norris for coffee-ground emesis Gastric antrum erosion, gastric erythema, duodenal erythema, grade a esophagitis reported.  Hiatal hernia was reported  March 2016 EGD by Dr. Gustavo Lah Gastric ulcers reported at that time as well  Colonoscopy 2005 for hematochezia and diarrhea Normal terminal ileum, normal colon reported.  Impression was IBS  Current Outpatient Medications  Medication Sig Dispense Refill  . acyclovir (ZOVIRAX) 400 MG tablet take 1 tablet by mouth twice a day (Patient taking differently: Take 400 mg by mouth 2 (two) times daily. ) 60 tablet 5  . amLODipine (NORVASC) 5 MG tablet TAKE 1 TABLET(5 MG) BY MOUTH DAILY FOR BLOOD PRESSURE (Patient taking differently: Take 5 mg by mouth daily. ) 30 tablet 5  . amphetamine-dextroamphetamine (ADDERALL) 30 MG tablet Take 30 mg by mouth 2 (two) times daily.     . Ascorbic Acid (VITAMIN C) 100 MG tablet Take 100 mg by mouth daily.    Marland Kitchen aspirin EC 81 MG tablet Take 81 mg by mouth daily.    . cholecalciferol (VITAMIN D) 1000 units tablet Take 1,000 Units by mouth daily.    . diazepam (VALIUM) 10 MG tablet Take 1 tablet (10 mg total) by mouth every 8 (eight) hours as needed for anxiety. 30 tablet 0  . metoprolol tartrate (LOPRESSOR) 50 MG tablet TAKE 1 TABLET BY  MOUTH TWICE A DAY 60 tablet 5  . nitroGLYCERIN (NITROSTAT) 0.4 MG SL tablet Place 1 tablet (0.4 mg total) under the tongue every 5 (five) minutes as needed for chest pain. 30 tablet 0  . nystatin (MYCOSTATIN/NYSTOP) powder Apply topically 2 (two) times daily. 15 g 0  . Omega-3 Fatty Acids (FISH OIL) 1000 MG CAPS Take 1 capsule by mouth daily.    . pantoprazole (PROTONIX) 40 MG tablet Take 1 tablet (40 mg total) by mouth daily for 30 days. 30 tablet 0   . PRALUENT 75 MG/ML SOPN Inject 75 mLs as directed every 14 (fourteen) days.     No current facility-administered medications for this visit.     Allergies as of 08/19/2018  . (No Known Allergies)    Review of Systems:    All systems reviewed and negative except where noted in HPI.   Observations/Objective:  Labs: CMP     Component Value Date/Time   NA 141 06/14/2018 1224   NA 144 06/05/2015 1203   NA 142 08/03/2014 2009   K 4.1 06/14/2018 1224   K 3.6 08/03/2014 2009   CL 103 06/14/2018 1224   CL 108 08/03/2014 2009   CO2 28 06/14/2018 1224   CO2 27 08/03/2014 2009   GLUCOSE 100 (H) 06/14/2018 1224   GLUCOSE 102 (H) 08/03/2014 2009   BUN 13 06/14/2018 1224   BUN 13 06/05/2015 1203   BUN 15 08/03/2014 2009   CREATININE 0.71 06/14/2018 1224   CREATININE 0.76 03/09/2018 1531   CALCIUM 9.3 06/14/2018 1224   CALCIUM 8.3 (L) 08/03/2014 2009   PROT 8.0 06/14/2018 1224   PROT 6.9 08/03/2014 2009   ALBUMIN 4.2 06/14/2018 1224   ALBUMIN 3.9 08/03/2014 2009   AST 21 06/14/2018 1224   AST 24 08/03/2014 2009   ALT 13 06/14/2018 1224   ALT 12 (L) 08/03/2014 2009   ALKPHOS 57 06/14/2018 1224   ALKPHOS 63 08/03/2014 2009   BILITOT 0.7 06/14/2018 1224   BILITOT <0.1 (L) 08/03/2014 2009   GFRNONAA >60 06/14/2018 1224   GFRNONAA 85 03/09/2018 1531   GFRAA >60 06/14/2018 1224   GFRAA 98 03/09/2018 1531   Lab Results  Component Value Date   WBC 7.1 06/14/2018   HGB 11.7 (L) 06/14/2018   HCT 37.5 06/14/2018   MCV 96.9 06/14/2018   PLT 305 06/14/2018    Imaging Studies: No results found.  Assessment and Plan:   Linda Mejia is a 63 y.o. y/o female with history of H. pylori, status post triple therapy and eradication confirmed on biopsies in January 2020, intestinal metaplasia seen on gastric biopsies  Assessment and Plan: We discussed the need for repeat EGD for gastric mapping biopsies due to intestinal metaplasia seen on last EGD.  Patient is agreeable to getting  this done, but states given her comorbidities and the current pandemic, and her recent admission in February 2020, she would like to avoid going out of the house and would like to delay this procedure until the pandemic situation is better.  She would like to target her procedure for 2021.  This is reasonable given that she is asymptomatic.  However, we discussed alarm symptoms for which to contact us and she verbalized understanding.  We also discussed need for colorectal cancer screening.  She is now agreeable for the colonoscopy but would like to do it at the same time as her EGD, in 2021.  She denies any NSAID use, no melena, no heartburn.  She  has previously needed PPIs due to esophagitis and gastric ulcers. (Risks of PPI use were discussed with patient including bone loss, C. Diff diarrhea, pneumonia, infections, CKD, electrolyte abnormalities. Pt. Verbalizes understanding and chooses to continue the medication.)  Labs reviewed and are reassuring, with no iron deficiency on recent labs   Follow Up Instructions: EGD and colonoscopy in January 2021 as per patient's wishes Clinic follow-up in 6 months   I discussed the assessment and treatment plan with the patient. The patient was provided an opportunity to ask questions and all were answered. The patient agreed with the plan and demonstrated an understanding of the instructions.   The patient was advised to call back or seek an in-person evaluation if the symptoms worsen or if the condition fails to improve as anticipated.  I provided 15 minutes of non-face-to-face time during this encounter.   Virgel Manifold, MD  Speech recognition software was used to dictate this note.

## 2018-08-31 DIAGNOSIS — G5602 Carpal tunnel syndrome, left upper limb: Secondary | ICD-10-CM | POA: Diagnosis not present

## 2018-08-31 DIAGNOSIS — M67839 Other specified disorders of synovium and tendon, unspecified forearm: Secondary | ICD-10-CM | POA: Diagnosis not present

## 2018-09-03 ENCOUNTER — Telehealth: Payer: Self-pay

## 2018-09-03 ENCOUNTER — Other Ambulatory Visit: Payer: Self-pay | Admitting: Nurse Practitioner

## 2018-09-03 DIAGNOSIS — M87038 Idiopathic aseptic necrosis of left carpus: Secondary | ICD-10-CM | POA: Diagnosis not present

## 2018-09-03 DIAGNOSIS — H543 Unqualified visual loss, both eyes: Secondary | ICD-10-CM

## 2018-09-03 NOTE — Telephone Encounter (Signed)
Copied from [place] [phone]. Topic: Referral - Status >> Sep 03, 2018  8:32 AM Margot Ables wrote: Reason for CRM: Pt is needing a new referral for opthamology. The one referred to at [facility] has moved away and was the only one who could perform the needed eye surgery. Please advise.

## 2018-09-03 NOTE — Telephone Encounter (Signed)
She had been referred a few weeks ago, it looks like it was authorized- will you provide her with this information. If she needs a referral to an someone capable of a specific surgery I will need a practice or provider name from her.

## 2018-09-03 NOTE — Telephone Encounter (Signed)
Copied from [place] [id number]. Topic: Referral - Status >> Sep 03, 2018  8:32 AM Margot Ables wrote: Reason for CRM: Pt is needing a new referral for opthamology. The one referred to at [facility] has moved away and was the only one who could perform the needed eye surgery. Please advise.

## 2018-09-22 DIAGNOSIS — R768 Other specified abnormal immunological findings in serum: Secondary | ICD-10-CM | POA: Insufficient documentation

## 2018-09-22 DIAGNOSIS — M87242 Osteonecrosis due to previous trauma, left hand: Secondary | ICD-10-CM | POA: Insufficient documentation

## 2018-09-22 DIAGNOSIS — M2550 Pain in unspecified joint: Secondary | ICD-10-CM | POA: Diagnosis not present

## 2018-09-22 DIAGNOSIS — M659 Synovitis and tenosynovitis, unspecified: Secondary | ICD-10-CM | POA: Diagnosis not present

## 2018-09-27 ENCOUNTER — Ambulatory Visit: Payer: Self-pay

## 2018-09-27 ENCOUNTER — Ambulatory Visit: Payer: Medicare Other | Admitting: Occupational Therapy

## 2018-09-27 NOTE — Telephone Encounter (Signed)
Incoming call from Patient reporting she is having a hard time clearing her throat at night time when she goes to bed.  Patent reports that she turns from left to right during the night.   Due to clearing throat.  Denies pain.  Onset was 3 moths ago. If Sx worsen will call back or contact Urgent Care.        2  Linda Mejia Female, 63 y.o., 05/15/1955 MRN:  [id number] Phone:  [phone] [name]) PCP:  Arnetha Courser, MD Primary Cvg:  [facility] 12/16/2018 at 8:00 AM Message from Sheran Luz sent at 09/27/2018 3:53 PM EDT  Summary: trouble clearing throat    Patient is requesting a call back from nurse to discuss some trouble she has with clearing her throat at night. She states while falling asleep she feels like there is something in her airway. She states this only happens at night. Please advise.         Call History   Type Contact  09/27/2018 03:52 PM EDT Phone (Incoming) [name] (Self)  Phone: [phone] (H)  User: Sheran Luz    Answer Assessment - Initial Assessment Questions 1. SYMPTOM: "What's the main symptom you're concerned about?" (e.g., dry mouth. chapped lips, lump)    When going to bed.   2. ONSET: "When did the    3 moths ago start?"      3. PAIN: "Is there any pain?" If so, ask: "How bad is it?" (Scale: 1-10; mild, moderate, severe)    Denies pain 4. CAUSE: "What do you think is causing the symptoms?"     *No Answer* 5. OTHER SYMPTOMS: "Do you have any other symptoms?" (e.g., fever, sore throat, toothache, swelling)   denies 6. PREGNANCY: "Is there any chance you are pregnant?" "When was your last menstrual period?"  Protocols used: MOUTH [facility]

## 2018-10-11 ENCOUNTER — Other Ambulatory Visit: Payer: Self-pay | Admitting: Family Medicine

## 2018-10-13 DIAGNOSIS — M25532 Pain in left wrist: Secondary | ICD-10-CM | POA: Diagnosis not present

## 2018-11-24 ENCOUNTER — Telehealth: Payer: Self-pay | Admitting: Emergency Medicine

## 2018-11-24 NOTE — Telephone Encounter (Signed)
Please advise, patient sent CRM requesting a order for specific type of bath tub. Do you need a virtual visit first or will you write out a order

## 2018-11-24 NOTE — Telephone Encounter (Signed)
This would likely need to be a visit - Please schedule her for when she is able to come in to discuss further.

## 2018-11-25 NOTE — Telephone Encounter (Signed)
Please make patient appointment 

## 2018-11-26 ENCOUNTER — Encounter: Payer: Self-pay | Admitting: Family Medicine

## 2018-11-26 ENCOUNTER — Ambulatory Visit: Payer: Medicare Other | Admitting: Family Medicine

## 2018-12-06 ENCOUNTER — Other Ambulatory Visit: Payer: Self-pay

## 2018-12-06 ENCOUNTER — Ambulatory Visit (INDEPENDENT_AMBULATORY_CARE_PROVIDER_SITE_OTHER): Payer: Medicare Other | Admitting: Family Medicine

## 2018-12-06 ENCOUNTER — Encounter: Payer: Self-pay | Admitting: Family Medicine

## 2018-12-06 VITALS — BP 134/78 | HR 67 | Temp 97.9°F | Resp 16 | Ht 65.0 in | Wt 212.7 lb

## 2018-12-06 DIAGNOSIS — E039 Hypothyroidism, unspecified: Secondary | ICD-10-CM | POA: Diagnosis not present

## 2018-12-06 DIAGNOSIS — R768 Other specified abnormal immunological findings in serum: Secondary | ICD-10-CM | POA: Diagnosis not present

## 2018-12-06 DIAGNOSIS — E6609 Other obesity due to excess calories: Secondary | ICD-10-CM

## 2018-12-06 DIAGNOSIS — E038 Other specified hypothyroidism: Secondary | ICD-10-CM

## 2018-12-06 DIAGNOSIS — R61 Generalized hyperhidrosis: Secondary | ICD-10-CM

## 2018-12-06 DIAGNOSIS — E782 Mixed hyperlipidemia: Secondary | ICD-10-CM

## 2018-12-06 DIAGNOSIS — F410 Panic disorder [episodic paroxysmal anxiety] without agoraphobia: Secondary | ICD-10-CM

## 2018-12-06 DIAGNOSIS — Z6834 Body mass index (BMI) 34.0-34.9, adult: Secondary | ICD-10-CM

## 2018-12-06 DIAGNOSIS — D473 Essential (hemorrhagic) thrombocythemia: Secondary | ICD-10-CM

## 2018-12-06 DIAGNOSIS — M87242 Osteonecrosis due to previous trauma, left hand: Secondary | ICD-10-CM | POA: Diagnosis not present

## 2018-12-06 DIAGNOSIS — D649 Anemia, unspecified: Secondary | ICD-10-CM

## 2018-12-06 DIAGNOSIS — Z23 Encounter for immunization: Secondary | ICD-10-CM | POA: Diagnosis not present

## 2018-12-06 DIAGNOSIS — D75839 Thrombocytosis, unspecified: Secondary | ICD-10-CM

## 2018-12-06 DIAGNOSIS — R7303 Prediabetes: Secondary | ICD-10-CM

## 2018-12-06 DIAGNOSIS — R7689 Other specified abnormal immunological findings in serum: Secondary | ICD-10-CM

## 2018-12-06 MED ORDER — VENLAFAXINE HCL ER 75 MG PO CP24: 75.0000 mg | ORAL_CAPSULE | Freq: Every day | ORAL | 0 refills | Status: DC

## 2018-12-06 MED ORDER — VENLAFAXINE HCL ER 37.5 MG PO CP24: 37.5000 mg | ORAL_CAPSULE | Freq: Every day | ORAL | 0 refills | Status: DC

## 2018-12-06 NOTE — Addendum Note (Signed)
Addended by: Ricke Kimoto, Ulla Potash on: 12/06/2018 01:34 PM   Modules accepted: Orders

## 2018-12-06 NOTE — Progress Notes (Signed)
Name: Linda Mejia   MRN: [id number]    DOB: 08/19/1955   Date:12/06/2018       Progress Note  Subjective  Chief Complaint  Chief Complaint  Patient presents with  . Referral    need letter for insurance to have tub replacement due to knee pain, wrist pain    HPI  Depression: She is really struggling emotionally right now; feels like COVID has made her feel very down because she is only seeing her daughter and not going out much at all. She has also been having night sweats. She is waking up hot and sweating.  Does see Dr. Kasandra Knudsen, but has not seen him in many months now.  She is taking adderall and valium.   Obesity/Prediabetes: She is not exercising due to chronic knee pain. Denies polydipsia, polyphagia; having some polyuria.   Bilateral knee pain: has been ongoing for years; she has a really deep tub that is very difficult for her to get in and out of for bathing, especially now that she has scaphoid aseptic necrosis.  We will provide note requesting a railings and any adjustments to make her bathing more accessible.   Scaphoid Aseptic Necrosis: She has seen ortho, and is now seeing rheumatolgy; she was told she needed sub-specialist for this - she has been going to see Dr. Grandville Silos w/ [place].  +ANA/Polyarthralgia: Needs Rheum follow up, but did not feel she connected with Dr. Meda Coffee.  Would like to follow up with Dr. Jefm Bryant regarding this instead.  She will call their office to schedule a follow up appointment.   CAD/HLD: Was seeing cardiology but decided not to return, allergic to statins, taking fish oil; out of Praluent for months.  She denies chest pain, shortness of breath. Taking metoprolol  HTN: Taking amlodipine and metoprolol without issues; BP at goal.  Denies chest pain, shortness of breath, myalgias.  Patient Active Problem List   Diagnosis Date Noted  . Breast mass, right 06/14/2018  . Hypoxia 05/29/2018  . Acute peptic ulcer of stomach   . Stomach irritation   .  Abdominal pain, epigastric   . Acute gastric ulcer due to Helicobacter pylori   . Pulmonary hypertension ([place]) 03/09/2018  . Unstable angina ([place]) 01/23/2018  . Medication monitoring encounter 03/18/2017  . Colon cancer screening 03/18/2017  . Coronary artery calcification seen on CT scan 12/02/2016  . Aortic atherosclerosis ([place]) 12/02/2016  . Abnormal ankle brachial index (ABI) 12/02/2016  . Prediabetes 12/02/2016  . Fracture of rib 08/28/2016  . Chest pain 08/11/2016  . Hx of tobacco use, presenting hazards to health 06/09/2016  . Degenerative arthritis of knee, bilateral 12/27/2015  . Obesity 12/27/2015  . GI bleed 09/21/2015  . Reflux esophagitis   . Other diseases of stomach and duodenum   . Multiple joint pain 06/05/2015  . Night sweats 06/05/2015  . Headache 04/12/2015  . Subclinical hypothyroidism 03/26/2015  . Herpes simplex type 2 infection 03/21/2015  . Rhinitis, allergic 03/21/2015  . Cervical pain 09/21/2014  . ADHD (attention deficit hyperactivity disorder), inattentive type 09/20/2014  . Episodic paroxysmal anxiety disorder 09/20/2014  . History of gastric ulcer 09/20/2014  . Abuse, drug or alcohol ([place]) 09/20/2014  . History of alcohol use disorder 09/20/2014  . HLD (hyperlipidemia) 10/03/2009  . Coronary artery disease involving native coronary artery of native heart with angina pectoris ([place]) 10/02/2009  . Hypertension goal BP (blood pressure) < 140/90 10/02/2009    Past Surgical History:  Procedure Laterality Date  .  APPENDECTOMY    . CARDIAC CATHETERIZATION    . CESAREAN SECTION    . COLON SURGERY     interception  . ESOPHAGOGASTRODUODENOSCOPY (EGD) WITH PROPOFOL N/A 09/21/2015   Procedure: ESOPHAGOGASTRODUODENOSCOPY (EGD) WITH PROPOFOL;  Surgeon: Lucilla Lame, MD;  Location: [facility] ENDOSCOPY;  Service: Endoscopy;  Laterality: N/A;  . ESOPHAGOGASTRODUODENOSCOPY (EGD) WITH PROPOFOL N/A 07/30/2016   Procedure: ESOPHAGOGASTRODUODENOSCOPY (EGD) WITH PROPOFOL;   Surgeon: Jonathon Bellows, MD;  Location: [facility] ENDOSCOPY;  Service: Endoscopy;  Laterality: N/A;  . ESOPHAGOGASTRODUODENOSCOPY (EGD) WITH PROPOFOL N/A 04/23/2018   Procedure: ESOPHAGOGASTRODUODENOSCOPY (EGD) WITH PROPOFOL;  Surgeon: Virgel Manifold, MD;  Location: [facility] ENDOSCOPY;  Service: Endoscopy;  Laterality: N/A;  . LEFT HEART CATH Right 01/25/2018   Procedure: Left Heart Cath and Coronary Angiography;  Surgeon: Dionisio David, MD;  Location: [facility];  Service: Cardiovascular;  Laterality: Right;  . LEFT HEART CATH AND CORONARY ANGIOGRAPHY Right 08/12/2016   Procedure: Left Heart Cath and Coronary Angiography;  Surgeon: Dionisio David, MD;  Location: [place];  Service: Cardiovascular;  Laterality: Right;  . TONSILLECTOMY    . TUBAL LIGATION      Family History  Problem Relation Age of Onset  . Diabetes Mother   . CVA Mother   . Heart disease Father   . Heart disease Sister   . Heart disease Brother   . Heart disease Sister   . Heart disease Sister   . Heart disease Sister   . Breast cancer Sister        1/2 sis ? age  . Heart disease Brother   . Heart disease Brother   . Heart disease Brother   . Breast cancer Other        niece in 44's    Social History   Socioeconomic History  . Marital status: Divorced    Spouse name: Not on file  . Number of children: 2  . Years of education: Not on file  . Highest education level: GED or equivalent  Occupational History  . Occupation: disabled  Social Needs  . Financial resource strain: Not hard at all  . Food insecurity    Worry: Never true    Inability: Never true  . Transportation needs    Medical: No    Non-medical: No  Tobacco Use  . Smoking status: Former Smoker    Packs/day: 1.00    Years: 30.00    Pack years: 30.00    Types: Cigarettes    Quit date: 2006    Years since quitting: 14.6  . Smokeless tobacco: Never Used  . Tobacco comment: smoking cessation materials not required   Substance and Sexual Activity  . Alcohol use: Not Currently    Alcohol/week: 0.0 standard drinks    Frequency: Never  . Drug use: No  . Sexual activity: Not Currently    Birth control/protection: None, Post-menopausal  Lifestyle  . Physical activity    Days per week: 0 days    Minutes per session: 0 min  . Stress: Not at all  Relationships  . Social Herbalist on phone: Patient refused    Gets together: Patient refused    Attends religious service: Patient refused    Active member of club or organization: Patient refused    Attends meetings of clubs or organizations: Patient refused    Relationship status: Divorced  . Intimate partner violence    Fear of current or ex partner: No  Emotionally abused: No    Physically abused: No    Forced sexual activity: No  Other Topics Concern  . Not on file  Social History Narrative  . Not on file     Current Outpatient Medications:  .  acyclovir (ZOVIRAX) 400 MG tablet, take 1 tablet by mouth twice a day (Patient taking differently: Take 400 mg by mouth 2 (two) times daily. ), Disp: 60 tablet, Rfl: 5 .  amLODipine (NORVASC) 5 MG tablet, TAKE 1 TABLET(5 MG) BY MOUTH DAILY FOR BLOOD PRESSURE, Disp: 30 tablet, Rfl: 5 .  amphetamine-dextroamphetamine (ADDERALL) 30 MG tablet, Take 30 mg by mouth 2 (two) times daily. , Disp: , Rfl:  .  Ascorbic Acid (VITAMIN C) 100 MG tablet, Take 100 mg by mouth daily., Disp: , Rfl:  .  aspirin EC 81 MG tablet, Take 81 mg by mouth daily., Disp: , Rfl:  .  cholecalciferol (VITAMIN D) 1000 units tablet, Take 1,000 Units by mouth daily., Disp: , Rfl:  .  diazepam (VALIUM) 10 MG tablet, Take 1 tablet (10 mg total) by mouth every 8 (eight) hours as needed for anxiety., Disp: 30 tablet, Rfl: 0 .  metoprolol tartrate (LOPRESSOR) 50 MG tablet, TAKE 1 TABLET BY MOUTH TWICE A DAY, Disp: 60 tablet, Rfl: 5 .  nitroGLYCERIN (NITROSTAT) 0.4 MG SL tablet, Place 1 tablet (0.4 mg total) under the tongue every 5  (five) minutes as needed for chest pain., Disp: 30 tablet, Rfl: 0 .  nystatin (MYCOSTATIN/NYSTOP) powder, Apply topically 2 (two) times daily., Disp: 15 g, Rfl: 0 .  Omega-3 Fatty Acids (FISH OIL) 1000 MG CAPS, Take 1 capsule by mouth daily., Disp: , Rfl:  .  pantoprazole (PROTONIX) 40 MG tablet, Take 1 tablet (40 mg total) by mouth daily for 30 days., Disp: 30 tablet, Rfl: 0 .  PRALUENT 75 MG/ML SOPN, Inject 75 mLs as directed every 14 (fourteen) days., Disp: , Rfl:   No Known Allergies  I personally reviewed active problem list, medication list, allergies, notes from last encounter, lab results with the patient/caregiver today.   ROS  Ten systems reviewed and is negative except as mentioned in HPI  Objective  Vitals:   12/06/18 1252  BP: 134/78  Pulse: 67  Resp: 16  Temp: 97.9 F (36.6 C)  TempSrc: Oral  SpO2: 98%  Weight: 212 lb 11.2 oz (96.5 kg)  Height: 5\' 5"  (1.651 m)    Body mass index is 35.4 kg/m.  Physical Exam  Constitutional: Patient appears well-developed and well-nourished. No distress.  HENT: Head: Normocephalic and atraumatic. Eyes: Conjunctivae and EOM are normal. No scleral icterus. Neck: Normal range of motion. Neck supple. No JVD present. No thyromegaly present.  Cardiovascular: Normal rate, regular rhythm and normal heart sounds.  No murmur heard. No BLE edema. Pulmonary/Chest: Effort normal and breath sounds normal. No respiratory distress. Musculoskeletal: Normal range of motion, no joint effusions. No gross deformities Neurological: Pt is alert and oriented to person, place, and time. No cranial nerve deficit. Coordination, balance, strength, speech and gait are normal.  Skin: Skin is warm and dry. No rash noted. No erythema.  Psychiatric: Patient has a normal mood and affect. behavior is normal. Judgment and thought content normal.  No results found for this or any previous visit (from the past 72 hour(s)).  PHQ2/9: Depression screen [facility] 2/9  12/06/2018 06/17/2018 06/14/2018 06/07/2018 03/09/2018  Decreased Interest 3 0 0 0 1  Down, Depressed, Hopeless 3 0 3 1 0  PHQ -  2 Score 6 0 3 1 1   Altered sleeping 3 0 3 - 3  Tired, decreased energy 3 0 3 - 3  Change in appetite 3 0 0 - 1  Feeling bad or failure about yourself  3 0 0 - 1  Trouble concentrating 1 0 0 - 0  Moving slowly or fidgety/restless 0 0 0 - 0  Suicidal thoughts 0 0 0 - 0  PHQ-9 Score 19 0 9 - 9  Difficult doing work/chores Somewhat difficult Not difficult at all Very difficult - Somewhat difficult  Some recent data might be hidden   PHQ-2/9 Result is positive.    Fall Risk: Fall Risk  12/06/2018 06/17/2018 06/14/2018 05/10/2018 03/09/2018  Falls in the past year? 0 0 0 0 0  Number falls in past yr: 0 - - 0 0  Injury with Fall? 0 - - 0 -  Comment - - - - -  Risk for fall due to : - - - - -  Risk for fall due to: Comment - - - - -  Follow up Falls evaluation completed - - - -   Assessment & Plan  1. 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 Grandville Silos  2. Positive ANA (antinuclear antibody) - Ambulatory referral to Rheumatology  3. Night sweats - COMPLETE METABOLIC PANEL WITH GFR - TSH - CBC with Differential/Platelet - QuantiFERON-TB Gold Plus - venlafaxine XR (EFFEXOR XR) 37.5 MG 24 hr capsule; Take 1 capsule (37.5 mg total) by mouth daily with breakfast. Fill this Rx first  Dispense: 14 capsule; Refill: 0 - venlafaxine XR (EFFEXOR XR) 75 MG 24 hr capsule; Take 1 capsule (75 mg total) by mouth daily with breakfast. Fill this Rx 2nd.  Dispense: 90 capsule; Refill: 0  4. Prediabetes - Hemoglobin A1c  5. Thrombocytosis (HCC) - CBC with Differential/Platelet  6. Anemia, unspecified type - CBC with Differential/Platelet  7. Subclinical hypothyroidism - TSH  8. Mixed hyperlipidemia - Lipid panel  9. Episodic paroxysmal anxiety disorder - venlafaxine XR (EFFEXOR XR) 37.5 MG 24 hr capsule; Take 1 capsule (37.5 mg  total) by mouth daily with breakfast. Fill this Rx first  Dispense: 14 capsule; Refill: 0 - venlafaxine XR (EFFEXOR XR) 75 MG 24 hr capsule; Take 1 capsule (75 mg total) by mouth daily with breakfast. Fill this Rx 2nd.  Dispense: 90 capsule; Refill: 0  10. Class 1 obesity due to excess calories without serious comorbidity with body mass index (BMI) of 34.0 to 34.9 in adult - Discussed importance of 150 minutes of physical activity weekly, eat two servings of fish weekly, eat one serving of tree nuts ( cashews, pistachios, pecans, almonds.Marland Kitchen) every other day, eat 6 servings of fruit/vegetables daily and drink plenty of water and avoid sweet beverages.  - COMPLETE METABOLIC PANEL WITH GFR - TSH

## 2018-12-06 NOTE — Addendum Note (Signed)
Addended by: Hubbard Hartshorn on: 12/06/2018 01:26 PM   Modules accepted: Orders

## 2018-12-09 ENCOUNTER — Other Ambulatory Visit: Payer: Self-pay | Admitting: Family Medicine

## 2018-12-09 DIAGNOSIS — I700 Atherosclerosis of aorta: Secondary | ICD-10-CM

## 2018-12-09 DIAGNOSIS — I2510 Atherosclerotic heart disease of native coronary artery without angina pectoris: Secondary | ICD-10-CM

## 2018-12-09 DIAGNOSIS — E782 Mixed hyperlipidemia: Secondary | ICD-10-CM

## 2018-12-09 DIAGNOSIS — I25119 Atherosclerotic heart disease of native coronary artery with unspecified angina pectoris: Secondary | ICD-10-CM

## 2018-12-09 DIAGNOSIS — Z789 Other specified health status: Secondary | ICD-10-CM

## 2018-12-09 LAB — COMPLETE METABOLIC PANEL WITH GFR
AG Ratio: 1.5 (calc) (ref 1.0–2.5)
ALT: 12 U/L (ref 6–29)
AST: 20 U/L (ref 10–35)
Albumin: 4.4 g/dL (ref 3.6–5.1)
Alkaline phosphatase (APISO): 79 U/L (ref 37–153)
BUN: 21 mg/dL (ref 7–25)
CO2: 33 mmol/L — ABNORMAL HIGH (ref 20–32)
Calcium: 9.8 mg/dL (ref 8.6–10.4)
Chloride: 102 mmol/L (ref 98–110)
Creat: 0.78 mg/dL (ref 0.50–0.99)
GFR, Est African American: 94 mL/min/{1.73_m2} (ref >=60)
GFR, Est Non African American: 81 mL/min/{1.73_m2} (ref >=60)
Globulin: 3 g/dL (calc) (ref 1.9–3.7)
Glucose, Bld: 103 mg/dL — ABNORMAL HIGH (ref 65–99)
Potassium: 5 mmol/L (ref 3.5–5.3)
Sodium: 142 mmol/L (ref 135–146)
Total Bilirubin: 0.3 mg/dL (ref 0.2–1.2)
Total Protein: 7.4 g/dL (ref 6.1–8.1)

## 2018-12-09 LAB — QUANTIFERON-TB GOLD PLUS
Mitogen-NIL: >10 IU/mL
NIL: 0.23 IU/mL
QuantiFERON-TB Gold Plus: NEGATIVE
TB1-NIL: <0 IU/mL
TB2-NIL: <0 IU/mL

## 2018-12-09 LAB — HEMOGLOBIN A1C
Hgb A1c MFr Bld: 6.1 % of total Hgb — ABNORMAL HIGH (ref <5.7)
Mean Plasma Glucose: 128 (calc)
eAG (mmol/L): 7.1 (calc)

## 2018-12-09 LAB — LIPID PANEL
Cholesterol: 332 mg/dL — ABNORMAL HIGH (ref <200)
HDL: 58 mg/dL (ref >=50)
LDL Cholesterol (Calc): 229 mg/dL (calc) — ABNORMAL HIGH
Non-HDL Cholesterol (Calc): 274 mg/dL (calc) — ABNORMAL HIGH (ref <130)
Total CHOL/HDL Ratio: 5.7 (calc) — ABNORMAL HIGH (ref <5.0)
Triglycerides: 250 mg/dL — ABNORMAL HIGH (ref <150)

## 2018-12-09 LAB — CBC WITH DIFFERENTIAL/PLATELET
Absolute Monocytes: 877 cells/uL (ref 200–950)
Basophils Absolute: 82 cells/uL (ref 0–200)
Basophils Relative: 1 %
Eosinophils Absolute: 262 cells/uL (ref 15–500)
Eosinophils Relative: 3.2 %
HCT: 43.3 % (ref 35.0–45.0)
Hemoglobin: 14.2 g/dL (ref 11.7–15.5)
Lymphs Abs: 2870 cells/uL (ref 850–3900)
MCH: 30.7 pg (ref 27.0–33.0)
MCHC: 32.8 g/dL (ref 32.0–36.0)
MCV: 93.7 fL (ref 80.0–100.0)
MPV: 9 fL (ref 7.5–12.5)
Monocytes Relative: 10.7 %
Neutro Abs: 4108 cells/uL (ref 1500–7800)
Neutrophils Relative %: 50.1 %
Platelets: 400 10*3/uL (ref 140–400)
RBC: 4.62 10*6/uL (ref 3.80–5.10)
RDW: 13.2 % (ref 11.0–15.0)
Total Lymphocyte: 35 %
WBC: 8.2 10*3/uL (ref 3.8–10.8)

## 2018-12-09 LAB — TSH: TSH: 1.54 mIU/L (ref 0.40–4.50)

## 2018-12-09 MED ORDER — PRALUENT 75 MG/ML ~~LOC~~ SOAJ: 1.0000 | SUBCUTANEOUS | 3 refills | Status: DC

## 2018-12-16 ENCOUNTER — Ambulatory Visit (INDEPENDENT_AMBULATORY_CARE_PROVIDER_SITE_OTHER): Payer: Medicare Other

## 2018-12-16 ENCOUNTER — Ambulatory Visit: Payer: Medicare Other

## 2018-12-16 VITALS — Temp 97.9°F | Ht 65.0 in | Wt 211.2 lb

## 2018-12-16 DIAGNOSIS — Z0000 Encounter for general adult medical examination without abnormal findings: Secondary | ICD-10-CM

## 2018-12-16 DIAGNOSIS — M931 Kienbock's disease of adults: Secondary | ICD-10-CM | POA: Insufficient documentation

## 2018-12-16 NOTE — Patient Instructions (Signed)
Linda Mejia , Thank you for taking time to come for your Medicare Wellness Visit. I appreciate your ongoing commitment to your health goals. Please review the following plan we discussed and let me know if I can assist you in the future.   Screening recommendations/referrals: Colonoscopy: Cologuard done 07/03/17. Repeat in 2022. Mammogram: done 06/21/18  Recommended yearly ophthalmology/optometry visit for glaucoma screening and checkup Recommended yearly dental visit for hygiene and checkup  Vaccinations: Influenza vaccine: done 12/06/18 Pneumococcal vaccine: done 12/15/17 Tdap vaccine: done 06/09/16 Shingles vaccine: Shingrix discussed. Please contact your pharmacy for coverage information.   Advanced directives: Advance directive discussed with you today. I have provided a copy for you to complete at home and have notarized. Once this is complete please bring a copy in to our office so we can scan it into your chart.  Conditions/risks identified: Recommend healthy eating and physical activity for desired weight loss  Next appointment: Please follow up in one year for your Medicare Annual Wellness visit.    Preventive Care 40-64 Years, Female Preventive care refers to lifestyle choices and visits with your health care provider that can promote health and wellness. What does preventive care include?  A yearly physical exam. This is also called an annual well check.  Dental exams once or twice a year.  Routine eye exams. Ask your health care provider how often you should have your eyes checked.  Personal lifestyle choices, including:  Daily care of your teeth and gums.  Regular physical activity.  Eating a healthy diet.  Avoiding tobacco and drug use.  Limiting alcohol use.  Practicing safe sex.  Taking low-dose aspirin daily starting at age 31.  Taking vitamin and mineral supplements as recommended by your health care provider. What happens during an annual well check? The  services and screenings done by your health care provider during your annual well check will depend on your age, overall health, lifestyle risk factors, and family history of disease. Counseling  Your health care provider may ask you questions about your:  Alcohol use.  Tobacco use.  Drug use.  Emotional well-being.  Home and relationship well-being.  Sexual activity.  Eating habits.  Work and work Statistician.  Method of birth control.  Menstrual cycle.  Pregnancy history. Screening  You may have the following tests or measurements:  Height, weight, and BMI.  Blood pressure.  Lipid and cholesterol levels. These may be checked every 5 years, or more frequently if you are over 84 years old.  Skin check.  Lung cancer screening. You may have this screening every year starting at age 35 if you have a 30-pack-year history of smoking and currently smoke or have quit within the past 15 years.  Fecal occult blood test (FOBT) of the stool. You may have this test every year starting at age 84.  Flexible sigmoidoscopy or colonoscopy. You may have a sigmoidoscopy every 5 years or a colonoscopy every 10 years starting at age 57.  Hepatitis C blood test.  Hepatitis B blood test.  Sexually transmitted disease (STD) testing.  Diabetes screening. This is done by checking your blood sugar (glucose) after you have not eaten for a while (fasting). You may have this done every 1-3 years.  Mammogram. This may be done every 1-2 years. Talk to your health care provider about when you should start having regular mammograms. This may depend on whether you have a family history of breast cancer.  BRCA-related cancer screening. This may be done if you  have a family history of breast, ovarian, tubal, or peritoneal cancers.  Pelvic exam and Pap test. This may be done every 3 years starting at age 7. Starting at age 51, this may be done every 5 years if you have a Pap test in combination with  an HPV test.  Bone density scan. This is done to screen for osteoporosis. You may have this scan if you are at high risk for osteoporosis. Discuss your test results, treatment options, and if necessary, the need for more tests with your health care provider. Vaccines  Your health care provider may recommend certain vaccines, such as:  Influenza vaccine. This is recommended every year.  Tetanus, diphtheria, and acellular pertussis (Tdap, Td) vaccine. You may need a Td booster every 10 years.  Zoster vaccine. You may need this after age 49.  Pneumococcal 13-valent conjugate (PCV13) vaccine. You may need this if you have certain conditions and were not previously vaccinated.  Pneumococcal polysaccharide (PPSV23) vaccine. You may need one or two doses if you smoke cigarettes or if you have certain conditions. Talk to your health care provider about which screenings and vaccines you need and how often you need them. This information is not intended to replace advice given to you by your health care provider. Make sure you discuss any questions you have with your health care provider. Document Released: 04/27/2015 Document Revised: 12/19/2015 Document Reviewed: 01/30/2015 Elsevier Interactive Patient Education  2017 [place] Prevention in the Home Falls can cause injuries. They can happen to people of all ages. There are many things you can do to make your home safe and to help prevent falls. What can I do on the outside of my home?  Regularly fix the edges of walkways and driveways and fix any cracks.  Remove anything that might make you trip as you walk through a door, such as a raised step or threshold.  Trim any bushes or trees on the path to your home.  Use bright outdoor lighting.  Clear any walking paths of anything that might make someone trip, such as rocks or tools.  Regularly check to see if handrails are loose or broken. Make sure that both sides of any steps  have handrails.  Any raised decks and porches should have guardrails on the edges.  Have any leaves, snow, or ice cleared regularly.  Use sand or salt on walking paths during winter.  Clean up any spills in your garage right away. This includes oil or grease spills. What can I do in the bathroom?  Use night lights.  Install grab bars by the toilet and in the tub and shower. Do not use towel bars as grab bars.  Use non-skid mats or decals in the tub or shower.  If you need to sit down in the shower, use a plastic, non-slip stool.  Keep the floor dry. Clean up any water that spills on the floor as soon as it happens.  Remove soap buildup in the tub or shower regularly.  Attach bath mats securely with double-sided non-slip rug tape.  Do not have throw rugs and other things on the floor that can make you trip. What can I do in the bedroom?  Use night lights.  Make sure that you have a light by your bed that is easy to reach.  Do not use any sheets or blankets that are too big for your bed. They should not hang down onto the floor.  Have  a firm chair that has side arms. You can use this for support while you get dressed.  Do not have throw rugs and other things on the floor that can make you trip. What can I do in the kitchen?  Clean up any spills right away.  Avoid walking on wet floors.  Keep items that you use a lot in easy-to-reach places.  If you need to reach something above you, use a strong step stool that has a grab bar.  Keep electrical cords out of the way.  Do not use floor polish or wax that makes floors slippery. If you must use wax, use non-skid floor wax.  Do not have throw rugs and other things on the floor that can make you trip. What can I do with my stairs?  Do not leave any items on the stairs.  Make sure that there are handrails on both sides of the stairs and use them. Fix handrails that are broken or loose. Make sure that handrails are as  long as the stairways.  Check any carpeting to make sure that it is firmly attached to the stairs. Fix any carpet that is loose or worn.  Avoid having throw rugs at the top or bottom of the stairs. If you do have throw rugs, attach them to the floor with carpet tape.  Make sure that you have a light switch at the top of the stairs and the bottom of the stairs. If you do not have them, ask someone to add them for you. What else can I do to help prevent falls?  Wear shoes that:  Do not have high heels.  Have rubber bottoms.  Are comfortable and fit you well.  Are closed at the toe. Do not wear sandals.  If you use a stepladder:  Make sure that it is fully opened. Do not climb a closed stepladder.  Make sure that both sides of the stepladder are locked into place.  Ask someone to hold it for you, if possible.  Clearly mark and make sure that you can see:  Any grab bars or handrails.  First and last steps.  Where the edge of each step is.  Use tools that help you move around (mobility aids) if they are needed. These include:  Canes.  Walkers.  Scooters.  Crutches.  Turn on the lights when you go into a dark area. Replace any light bulbs as soon as they burn out.  Set up your furniture so you have a clear path. Avoid moving your furniture around.  If any of your floors are uneven, fix them.  If there are any pets around you, be aware of where they are.  Review your medicines with your doctor. Some medicines can make you feel dizzy. This can increase your chance of falling. Ask your doctor what other things that you can do to help prevent falls. This information is not intended to replace advice given to you by your health care provider. Make sure you discuss any questions you have with your health care provider. Document Released: 01/25/2009 Document Revised: 09/06/2015 Document Reviewed: 05/05/2014 Elsevier Interactive Patient Education  2017 [employer].

## 2018-12-16 NOTE — Progress Notes (Signed)
Subjective:   Linda Mejia is a 63 y.o. female who presents for Medicare Annual (Subsequent) preventive examination.  Virtual Visit via Telephone Note  I connected with Linda Mejia on 12/16/18 at 11:20 AM EDT by telephone and verified that I am speaking with the correct person using two identifiers.  Medicare Annual Wellness visit completed telephonically due to Covid-19 pandemic.   Location: Patient: home Provider: office   I discussed the limitations, risks, security and privacy concerns of performing an evaluation and management service by telephone and the availability of in person appointments. The patient expressed understanding and agreed to proceed.  Some vital signs may be absent or patient reported.   Clemetine Marker, LPN    Review of Systems:   Cardiac Risk Factors include: hypertension;obesity (BMI >30kg/m2)     Objective:     Vitals: Temp 97.9 F (36.6 C) (Oral)   Ht 5\' 5"  (1.651 m)   Wt 211 lb 3.2 oz (95.8 kg)   BMI 35.15 kg/m   Body mass index is 35.15 kg/m.  Advanced Directives 12/16/2018 05/29/2018 05/28/2018 05/16/2018 05/16/2018 04/23/2018 03/26/2018  Does Patient Have a Medical Advance Directive? No No No No No No No  Would patient like information on creating a medical advance directive? Yes (MAU/Ambulatory/Procedural Areas - Information given) No - Patient declined No - Patient declined No - Patient declined - No - Patient declined No - Patient declined  Some encounter information is confidential and restricted. Go to Review Flowsheets activity to see all data.    Tobacco Social History   Tobacco Use  Smoking Status Former Smoker  . Packs/day: 1.00  . Years: 30.00  . Pack years: 30.00  . Types: Cigarettes  . Quit date: 2006  . Years since quitting: 14.6  Smokeless Tobacco Never Used  Tobacco Comment   smoking cessation materials not required     Counseling given: Not Answered Comment: smoking cessation materials not required   Clinical Intake:  Pre-visit preparation completed: Yes  Pain : No/denies pain     BMI - recorded: 35.15 Nutritional Status: BMI > 30  Obese Nutritional Risks: Other (Comment) Diabetes: No  How often do you need to have someone help you when you read instructions, pamphlets, or other written materials from your doctor or pharmacy?: 1 - Never  Interpreter Needed?: No  Information entered by :: Clemetine Marker LPN  Past Medical History:  Diagnosis Date  . Abnormal ankle brachial index (ABI) 12/02/2016  . ADHD (attention deficit hyperactivity disorder)   . Anxiety   . Aortic atherosclerosis ([place]) 12/02/2016   July 2018  . Chronic back pain   . Coronary artery disease   . Herpes genitalis in women   . History of stomach ulcers    x7 this year. Bleeding  . Hypertension   . Prediabetes 12/02/2016  . Pulmonary hypertension ([place]) 03/09/2018   Echo Oct 2019   Past Surgical History:  Procedure Laterality Date  . APPENDECTOMY    . CARDIAC CATHETERIZATION    . CESAREAN SECTION    . COLON SURGERY     interception  . ESOPHAGOGASTRODUODENOSCOPY (EGD) WITH PROPOFOL N/A 09/21/2015   Procedure: ESOPHAGOGASTRODUODENOSCOPY (EGD) WITH PROPOFOL;  Surgeon: Lucilla Lame, MD;  Location: [facility];  Service: Endoscopy;  Laterality: N/A;  . ESOPHAGOGASTRODUODENOSCOPY (EGD) WITH PROPOFOL N/A 07/30/2016   Procedure: ESOPHAGOGASTRODUODENOSCOPY (EGD) WITH PROPOFOL;  Surgeon: Jonathon Bellows, MD;  Location: [facility];  Service: Endoscopy;  Laterality: N/A;  . ESOPHAGOGASTRODUODENOSCOPY (EGD) WITH PROPOFOL N/A 04/23/2018  Procedure: ESOPHAGOGASTRODUODENOSCOPY (EGD) WITH PROPOFOL;  Surgeon: Virgel Manifold, MD;  Location: [facility];  Service: Endoscopy;  Laterality: N/A;  . LEFT HEART CATH Right 01/25/2018   Procedure: Left Heart Cath and Coronary Angiography;  Surgeon: Dionisio David, MD;  Location: [facility];  Service: Cardiovascular;  Laterality: Right;  . LEFT HEART CATH AND CORONARY ANGIOGRAPHY  Right 08/12/2016   Procedure: Left Heart Cath and Coronary Angiography;  Surgeon: Dionisio David, MD;  Location: [facility];  Service: Cardiovascular;  Laterality: Right;  . TONSILLECTOMY    . TUBAL LIGATION     Family History  Problem Relation Age of Onset  . Diabetes Mother   . CVA Mother   . Heart disease Father   . Heart disease Sister   . Heart disease Brother   . Heart disease Sister   . Heart disease Sister   . Heart disease Sister   . Breast cancer Sister        1/2 sis ? age  . Heart disease Brother   . Heart disease Brother   . Heart disease Brother   . Breast cancer Other        niece in 7's   Social History   Socioeconomic History  . Marital status: Divorced    Spouse name: Not on file  . Number of children: 2  . Years of education: Not on file  . Highest education level: GED or equivalent  Occupational History  . Occupation: disabled  Social Needs  . Financial resource strain: Not hard at all  . Food insecurity    Worry: Never true    Inability: Never true  . Transportation needs    Medical: No    Non-medical: No  Tobacco Use  . Smoking status: Former Smoker    Packs/day: 1.00    Years: 30.00    Pack years: 30.00    Types: Cigarettes    Quit date: 2006    Years since quitting: 14.6  . Smokeless tobacco: Never Used  . Tobacco comment: smoking cessation materials not required  Substance and Sexual Activity  . Alcohol use: Not Currently    Alcohol/week: 0.0 standard drinks    Frequency: Never  . Drug use: No  . Sexual activity: Not Currently    Birth control/protection: None, Post-menopausal  Lifestyle  . Physical activity    Days per week: 0 days    Minutes per session: 0 min  . Stress: Not at all  Relationships  . Social Herbalist on phone: Patient refused    Gets together: Patient refused    Attends religious service: Patient refused    Active member of club or organization: Patient refused    Attends meetings of  clubs or organizations: Patient refused    Relationship status: Divorced  Other Topics Concern  . Not on file  Social History Narrative  . Not on file    Outpatient Encounter Medications as of 12/16/2018  Medication Sig  . acyclovir (ZOVIRAX) 400 MG tablet take 1 tablet by mouth twice a day (Patient taking differently: Take 400 mg by mouth 2 (two) times daily. )  . Alirocumab (PRALUENT) 75 MG/ML SOAJ Inject 1 each into the skin every 14 (fourteen) days.  Marland Kitchen amLODipine (NORVASC) 5 MG tablet TAKE 1 TABLET(5 MG) BY MOUTH DAILY FOR BLOOD PRESSURE  . amphetamine-dextroamphetamine (ADDERALL) 30 MG tablet Take 30 mg by mouth 2 (two) times daily.   . Ascorbic Acid (VITAMIN  C) 100 MG tablet Take 100 mg by mouth daily.  Marland Kitchen aspirin EC 81 MG tablet Take 81 mg by mouth daily.  . cholecalciferol (VITAMIN D) 1000 units tablet Take 1,000 Units by mouth daily.  . diazepam (VALIUM) 10 MG tablet Take 1 tablet (10 mg total) by mouth every 8 (eight) hours as needed for anxiety.  . metoprolol tartrate (LOPRESSOR) 50 MG tablet TAKE 1 TABLET BY MOUTH TWICE A DAY  . nitroGLYCERIN (NITROSTAT) 0.4 MG SL tablet Place 1 tablet (0.4 mg total) under the tongue every 5 (five) minutes as needed for chest pain.  Marland Kitchen nystatin (MYCOSTATIN/NYSTOP) powder Apply topically 2 (two) times daily.  . Omega-3 Fatty Acids (FISH OIL) 1000 MG CAPS Take 1 capsule by mouth daily.  Marland Kitchen venlafaxine XR (EFFEXOR XR) 37.5 MG 24 hr capsule Take 1 capsule (37.5 mg total) by mouth daily with breakfast. Fill this Rx first  . pantoprazole (PROTONIX) 40 MG tablet Take 1 tablet (40 mg total) by mouth daily for 30 days.  Marland Kitchen venlafaxine XR (EFFEXOR XR) 75 MG 24 hr capsule Take 1 capsule (75 mg total) by mouth daily with breakfast. Fill this Rx 2nd. (Patient not taking: Reported on 12/16/2018)   No facility-administered encounter medications on file as of 12/16/2018.     Activities of Daily Living In your present state of health, do you have any difficulty  performing the following activities: 12/16/2018 06/17/2018  Hearing? N N  Comment declines hearing aids -  Vision? N N  Difficulty concentrating or making decisions? N N  Walking or climbing stairs? N N  Dressing or bathing? N N  Doing errands, shopping? N N  Preparing Food and eating ? N -  Using the Toilet? N -  In the past six months, have you accidently leaked urine? N -  Do you have problems with loss of bowel control? N -  Managing your Medications? N -  Managing your Finances? N -  Housekeeping or managing your Housekeeping? N -  Some recent data might be hidden    Patient Care Team: Hubbard Hartshorn, FNP as PCP - General (Family Medicine)    Assessment:   This is a routine wellness examination for Linda Mejia.  Exercise Activities and Dietary recommendations Current Exercise Habits: The patient does not participate in regular exercise at present, Exercise limited by: None identified  Goals    . DIET - INCREASE WATER INTAKE     Recommend to drink at least 6-8 8oz glasses of water per day.    . Increase physical activity     Recommend increasing physical activity to at least 3 days per week       Fall Risk Fall Risk  12/16/2018 12/06/2018 06/17/2018 06/14/2018 05/10/2018  Falls in the past year? 0 0 0 0 0  Number falls in past yr: 0 0 - - 0  Injury with Fall? 0 0 - - 0  Comment - - - - -  Risk for fall due to : - - - - -  Risk for fall due to: Comment - - - - -  Follow up Falls prevention discussed Falls evaluation completed - - -   FALL RISK PREVENTION PERTAINING TO THE HOME:  Any stairs in or around the home? Yes  If so, do they handrails? Yes   Home free of loose throw rugs in walkways, pet beds, electrical cords, etc? Yes  Adequate lighting in your home to reduce risk of falls? Yes   ASSISTIVE DEVICES  UTILIZED TO PREVENT FALLS:  Life alert? No  Use of a cane, walker or w/c? No  Grab bars in the bathroom? No  Shower chair or bench in shower? No  Elevated toilet seat  or a handicapped toilet? No   DME ORDERS:  DME order needed?  No   TIMED UP AND GO:  Was the test performed? No . Telephonic visit.   Education: Fall risk prevention has been discussed.  Intervention(s) required? No   Depression Screen PHQ 2/9 Scores 12/16/2018 12/06/2018 06/17/2018 06/14/2018  PHQ - 2 Score - 6 0 3  PHQ- 9 Score - 19 0 9  Exception Documentation Patient refusal - - -     Cognitive Function     6CIT Screen 12/16/2018 12/15/2017  What Year? 0 points 0 points  What month? 0 points 0 points  What time? 0 points 0 points  Count back from 20 0 points 0 points  Months in reverse 0 points 0 points  Repeat phrase 2 points 2 points  Total Score 2 2    Immunization History  Administered Date(s) Administered  . Influenza Inj Mdck Quad Pf 12/06/2018  . Influenza,inj,Quad PF,6+ Mos 03/26/2015, 06/09/2016, 03/18/2017, 12/15/2017  . Tdap 06/09/2016    Qualifies for Shingles Vaccine? Yes  . Due for Shingrix. Education has been provided regarding the importance of this vaccine. Pt has been advised to call insurance company to determine out of pocket expense. Advised may also receive vaccine at local pharmacy or Health Dept. Verbalized acceptance and understanding.  Tdap: Up to date  Flu Vaccine: Up to date  Pneumococcal Vaccine: Due for Pneumococcal vaccine at age 82.  Screening Tests Health Maintenance  Topic Date Due  . PAP SMEAR-Modifier  03/25/2018  . MAMMOGRAM  06/21/2019  . Fecal DNA (Cologuard)  06/24/2020  . TETANUS/TDAP  06/09/2026  . INFLUENZA VACCINE  Completed  . Hepatitis C Screening  Completed  . HIV Screening  Completed    Cancer Screenings:  Colorectal Screening: Cologuard completed 06/24/17. Repeat every 3 years;  Mammogram: Completed 06/21/18. Repeat every year   Bone Density: Due age 43  Lung Cancer Screening: (Low Dose CT Chest recommended if Age 17-80 years, 30 pack-year currently smoking OR have quit w/in 15years.) does not qualify.    Additional Screening:  Hepatitis C Screening: does qualify; Completed 06/09/16  Vision Screening: Recommended annual ophthalmology exams for early detection of glaucoma and other disorders of the eye. Is the patient up to date with their annual eye exam?  Yes  Who is the provider or what is the name of the office in which the pt attends annual eye exams? [place] Screening: Recommended annual dental exams for proper oral hygiene  Community Resource Referral:  CRR required this visit?  No      Plan:     I have personally reviewed and addressed the Medicare Annual Wellness questionnaire and have noted the following in the patient's chart:  A. Medical and social history B. Use of alcohol, tobacco or illicit drugs  C. Current medications and supplements D. Functional ability and status E.  Nutritional status F.  Physical activity G. Advance directives H. List of other physicians I.  Hospitalizations, surgeries, and ER visits in previous 12 months J.  [place] such as hearing and vision if needed, cognitive and depression L. Referrals and appointments   In addition, I have reviewed and discussed with patient certain preventive protocols, quality metrics, and best practice recommendations. A  written personalized care plan for preventive services as well as general preventive health recommendations were provided to patient.   Signed,  Clemetine Marker, LPN Nurse Health Advisor   Nurse Notes:pt states she has seen improvement with starting effexor and nausea has improved, just taking it one day at a time. Pt also states she has improved her eating habits and focusing on healthy weight loss goal and plans to increase physical activity once it is cooler outside.

## 2018-12-18 ENCOUNTER — Other Ambulatory Visit: Payer: Self-pay | Admitting: Family Medicine

## 2018-12-18 DIAGNOSIS — I10 Essential (primary) hypertension: Secondary | ICD-10-CM

## 2018-12-18 DIAGNOSIS — I25119 Atherosclerotic heart disease of native coronary artery with unspecified angina pectoris: Secondary | ICD-10-CM

## 2018-12-22 DIAGNOSIS — J069 Acute upper respiratory infection, unspecified: Secondary | ICD-10-CM | POA: Diagnosis not present

## 2018-12-23 DIAGNOSIS — M25532 Pain in left wrist: Secondary | ICD-10-CM | POA: Diagnosis not present

## 2018-12-23 DIAGNOSIS — M25562 Pain in left knee: Secondary | ICD-10-CM | POA: Diagnosis not present

## 2018-12-23 DIAGNOSIS — M25561 Pain in right knee: Secondary | ICD-10-CM | POA: Diagnosis not present

## 2019-01-04 NOTE — Progress Notes (Signed)
Office Visit Note  Patient: Linda Mejia             Date of Birth: 12/18/1955           MRN: [id number]             PCP: Hubbard Hartshorn, FNP Referring: Hubbard Hartshorn, FNP Visit Date: 01/17/2019 Occupation: @GUAROCC @  Subjective:  Positive ANA and joint pain.   History of Present Illness: Linda Mejia is a 63 y.o. female seen in consultation per request of her PCP.  According to patient she has been having off-and-on swelling in her bilateral lower extremities for the last 1 year.  She states the swelling involves her palm both feet and it climbs all the way to her lower extremities even above her knee.  She states her skin becomes very tight and painful with the swelling.  She also has some discomfort in her other joints which include cervical spine, lumbar spine, wrist joints, hands, hip joints, knee joints and bottom of her feet.  She states she has had cortisone injections to her knee joints in the past by orthopedic surgeons.  She has been also seeing Dr. Grandville Silos for her left wrist joints and she was diagnosed with Kienbock's disease.  She states recently her PCP did labs and it came positive for ANA and for that reason she was referred to me.  She denies any history of oral ulcers, nasal ulcers, malar rash, photosensitivity, Raynaud's phenomenon.  There is no family history of autoimmune disease.  Activities of Daily Living:  Patient reports morning stiffness for 24 hours.   Patient Denies nocturnal pain.  Difficulty dressing/grooming: Reports Difficulty climbing stairs: Reports Difficulty getting out of chair: Reports Difficulty using hands for taps, buttons, cutlery, and/or writing: Denies  Review of Systems  Constitutional: Positive for fatigue. Negative for night sweats, weight gain and weight loss.  HENT: Negative for mouth sores, trouble swallowing, trouble swallowing, mouth dryness and nose dryness.   Eyes: Negative for pain, redness, itching, visual disturbance and dryness.   Respiratory: Negative for cough, shortness of breath and difficulty breathing.   Cardiovascular: Negative for chest pain, palpitations, hypertension, irregular heartbeat and swelling in legs/feet.  Gastrointestinal: Positive for constipation. Negative for blood in stool and diarrhea.  Endocrine: Negative for increased urination.  Genitourinary: Negative for difficulty urinating, painful urination and vaginal dryness.  Musculoskeletal: Positive for arthralgias, gait problem, joint pain, joint swelling and morning stiffness. Negative for myalgias, muscle weakness, muscle tenderness and myalgias.  Skin: Positive for rash. Negative for color change, hair loss, skin tightness, ulcers and sensitivity to sunlight.  Allergic/Immunologic: Negative for susceptible to infections.  Neurological: Positive for headaches, memory loss and weakness. Negative for dizziness, light-headedness, numbness and night sweats.  Hematological: Positive for bruising/bleeding tendency. Negative for swollen glands.  Psychiatric/Behavioral: Negative for depressed mood, confusion and sleep disturbance. The patient is nervous/anxious.     PMFS History:  Patient Active Problem List   Diagnosis Date Noted  . 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 Other specified abnormal immunological findings in serum 09/22/2018  . 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preiser), left ([place]) 09/22/2018  . Tenosynovitis of hand 09/22/2018  . Breast mass, right 06/14/2018  . Hypoxia 05/29/2018  . Acute peptic ulcer of stomach   . Stomach irritation   . Abdominal pain, epigastric   . Acute gastric ulcer due to Helicobacter pylori   . Pulmonary hypertension ([place]) 03/09/2018  . Unstable angina (  [place]) 01/23/2018  . Medication monitoring encounter 03/18/2017  . Colon cancer screening 03/18/2017  . Coronary artery calcification seen on CT scan 12/02/2016  . Aortic atherosclerosis ([place]) 12/02/2016  . Abnormal ankle brachial  index (ABI) 12/02/2016  . Prediabetes 12/02/2016  . Fracture of rib 08/28/2016  . Chest pain 08/11/2016  . Hx of tobacco use, presenting hazards to health 06/09/2016  . Degenerative arthritis of knee, bilateral 12/27/2015  . Obesity 12/27/2015  . GI bleed 09/21/2015  . Reflux esophagitis   . Other diseases of stomach and duodenum   . Polyarthralgia 06/05/2015  . Night sweats 06/05/2015  . Headache 04/12/2015  . Subclinical hypothyroidism 03/26/2015  . Herpes simplex type 2 infection 03/21/2015  . Rhinitis, allergic 03/21/2015  . ADHD (attention deficit hyperactivity disorder), inattentive type 09/20/2014  . Episodic paroxysmal anxiety disorder 09/20/2014  . History of gastric ulcer 09/20/2014  . Abuse, drug or alcohol ([place]) 09/20/2014  . HLD (hyperlipidemia) 10/03/2009  . Coronary artery disease involving native coronary artery of native heart with angina pectoris ([place]) 10/02/2009  . Hypertension goal BP (blood pressure) < 140/90 10/02/2009    Past Medical History:  Diagnosis Date  . Abnormal ankle brachial index (ABI) 12/02/2016  . ADHD (attention deficit hyperactivity disorder)   . Anxiety   . Aortic atherosclerosis ([place]) 12/02/2016   July 2018  . Chronic back pain   . Coronary artery disease   . Herpes genitalis in women   . History of stomach ulcers    x7 this year. Bleeding  . Hypertension   . Prediabetes 12/02/2016  . Pulmonary hypertension ([place]) 03/09/2018   Echo Oct 2019    Family History  Problem Relation Age of Onset  . Diabetes Mother   . CVA Mother   . Heart disease Father   . Heart disease Sister   . Breast cancer Sister   . Heart disease Brother   . Heart disease Sister   . Heart disease Sister   . Heart disease Sister   . Heart disease Brother   . Heart disease Brother   . Heart disease Brother   . Heart disease Brother   . Healthy Daughter   . Healthy Daughter    Past Surgical History:  Procedure Laterality Date  . APPENDECTOMY    . CARDIAC  CATHETERIZATION    . CESAREAN SECTION     x2  . COLON SURGERY     interception  . ESOPHAGOGASTRODUODENOSCOPY (EGD) WITH PROPOFOL N/A 09/21/2015   Procedure: ESOPHAGOGASTRODUODENOSCOPY (EGD) WITH PROPOFOL;  Surgeon: Lucilla Lame, MD;  Location: [facility];  Service: Endoscopy;  Laterality: N/A;  . ESOPHAGOGASTRODUODENOSCOPY (EGD) WITH PROPOFOL N/A 07/30/2016   Procedure: ESOPHAGOGASTRODUODENOSCOPY (EGD) WITH PROPOFOL;  Surgeon: Jonathon Bellows, MD;  Location: [facility];  Service: Endoscopy;  Laterality: N/A;  . ESOPHAGOGASTRODUODENOSCOPY (EGD) WITH PROPOFOL N/A 04/23/2018   Procedure: ESOPHAGOGASTRODUODENOSCOPY (EGD) WITH PROPOFOL;  Surgeon: Virgel Manifold, MD;  Location: [facility];  Service: Endoscopy;  Laterality: N/A;  . LEFT HEART CATH Right 01/25/2018   Procedure: Left Heart Cath and Coronary Angiography;  Surgeon: Dionisio David, MD;  Location: [place] [facility];  Service: Cardiovascular;  Laterality: Right;  . LEFT HEART CATH AND CORONARY ANGIOGRAPHY Right 08/12/2016   Procedure: Left Heart Cath and Coronary Angiography;  Surgeon: Dionisio David, MD;  Location: [place] [facility];  Service: Cardiovascular;  Laterality: Right;  . TONSILLECTOMY    . TUBAL LIGATION     Social History   Social History Narrative  .  Not on file   Immunization History  Administered Date(s) Administered  . Influenza Inj Mdck Quad Pf 12/06/2018  . Influenza,inj,Quad PF,6+ Mos 03/26/2015, 06/09/2016, 03/18/2017, 12/15/2017  . Tdap 06/09/2016     Objective: Vital Signs: BP (!) 165/101 (BP Location: Right Arm, Patient Position: Sitting, Cuff Size: Normal)   Pulse 97   Resp 14   Ht 5\' 5"  (1.651 m)   Wt 216 lb (98 kg)   BMI 35.94 kg/m    Physical Exam Vitals signs and nursing note reviewed.  Constitutional:      Appearance: She is well-developed.  HENT:     Head: Normocephalic and atraumatic.  Eyes:     Conjunctiva/sclera: Conjunctivae normal.  Neck:     Musculoskeletal: Normal  range of motion.  Cardiovascular:     Rate and Rhythm: Normal rate and regular rhythm.     Heart sounds: Normal heart sounds.  Pulmonary:     Effort: Pulmonary effort is normal.     Breath sounds: Normal breath sounds.  Abdominal:     General: Bowel sounds are normal.     Palpations: Abdomen is soft.  Lymphadenopathy:     Cervical: No cervical adenopathy.  Skin:    General: Skin is warm and dry.     Capillary Refill: Capillary refill takes less than 2 seconds.  Neurological:     Mental Status: She is alert and oriented to person, place, and time.  Psychiatric:        Behavior: Behavior normal.      Musculoskeletal Exam: Patient has some stiffness with range of motion of her cervical spine.  She had good range of motion of her thoracic spine.  She has discomfort range of motion of her lumbar spine.  Shoulder joints, elbow joints, wrist joints, MCPs PIPs and DIPs with good range of motion with no synovitis.  She has some prominence of PIP and DIP joints.  She has some tenderness over left wrist on the radial aspect.  No synovitis was noted.  Hip joints, knee joints, ankles, MTPs with good range of motion with no synovitis.  She had mild tenderness over plantar fascia.  CDAI Exam: CDAI Score: - Patient Global: -; Provider Global: - Swollen: -; Tender: - Joint Exam   No joint exam has been documented for this visit   There is currently no information documented on the homunculus. Go to the Rheumatology activity and complete the homunculus joint exam.  Investigation: Findings:  06/14/18: ANA positive, smith-, CCP5, DNA histone 0.3, LA-, anticardiolipin ab-, LDH 168, TSH 1.793, Ace 15, vitamin B12 399, folate 14.6, ferritin 52, iron 102, TIBC 375  Component     Latest Ref Rng & Units 06/14/2018 06/16/2018  Iron     28 - 170 ug/dL 102   TIBC     250 - 450 ug/dL 375   Saturation Ratios     10.4 - 31.8 % 27   UIBC     ug/dL 273   [facility] test code       [id number]  [facility] test name        ANTIHISTONE  Misc [facility] result       COMMENT  Anticardiolipin Ab,IgG,Qn     0 - 14 GPL U/mL  <9  Anticardiolipin Ab,IgM,Qn     0 - 12 MPL U/mL  <9  Anticardiolipin Ab,IgA,Qn     0 - 11 APL U/mL  <9  PTT Lupus Anticoagulant     0.0 - 51.9 sec  25.8  DRVVT     0.0 - 47.0 sec  29.7  Lupus Anticoag Interp       Comment:  LDH     98 - 192 U/L 168   Path Review      Peripheral blood smear is reviewed.   ENA SM Ab Ser-aCnc     0.0 - 0.9 AI  <0.2  CCP Antibodies IgG/IgA     0 - 19 units  5  DNA-Histone     0.0 - 0.9 Units  0.3   Component     Latest Ref Rng & Units 06/14/2018  Ferritin     11 - 307 ng/mL 52  Folate     >5.9 ng/mL 14.6  Vitamin B12     180 - 914 pg/mL 399  Angiotensin-Converting Enzyme     14 - 82 U/L 15   Component     Latest Ref Rng & Units 06/14/2018  TSH     0.350 - 4.500 uIU/mL 1.793  Anti Nuclear Antibody (ANA)     Negative Positive (A)   Imaging: Xr Foot 2 Views Left  Result Date: 01/17/2019 First MTP, PIP and DIP narrowing was noted.  A small inferior calcaneal spur was noted.  No erosive changes were noted.  No juxta-articular osteopenia was noted. Impression: These findings are consistent with osteoarthritis of the foot.  Xr Foot 2 Views Right  Result Date: 01/17/2019 First MTP, PIP and DIP narrowing was noted.  No juxta-articular osteopenia was noted.  No erosive changes were noted.  Inferior and posterior calcaneal spurs were noted. Impression: These findings are consistent with osteoarthritis of the foot.  Xr Hand 2 View Left  Result Date: 01/17/2019 CMC, PIP and DIP narrowing was noted.  No MCP, intercarpal radiocarpal joint space narrowing was noted.  No erosive changes were noted. Impression: These findings are consistent with osteoarthritis of the hand.  Xr Hand 2 View Right  Result Date: 01/17/2019 PIP, DIP and CMC narrowing was noted.  No significant MCP intercarpal radiocarpal joint space narrowing was noted.  No erosive changes  were noted. Impression: These findings are consistent with osteoarthritis of the hand.  Xr Knee 3 View Left  Result Date: 01/17/2019 Moderate medial compartment narrowing and moderate patellofemoral narrowing was noted.  No chondrocalcinosis was noted. Impression: These findings are consistent with moderate osteoarthritis and moderate chondromalacia patella.  Xr Knee 3 View Right  Result Date: 01/17/2019 Moderate medial compartment narrowing was noted.  Moderate patellofemoral narrowing was noted.  No chondrocalcinosis was noted. Impression: These findings are consistent with moderate osteoarthritis and moderate chondromalacia patella.   Recent Labs: Lab Results  Component Value Date   WBC 8.2 12/06/2018   HGB 14.2 12/06/2018   PLT 400 12/06/2018   NA 142 12/06/2018   K 5.0 12/06/2018   CL 102 12/06/2018   CO2 33 (H) 12/06/2018   GLUCOSE 103 (H) 12/06/2018   BUN 21 12/06/2018   CREATININE 0.78 12/06/2018   BILITOT 0.3 12/06/2018   ALKPHOS 57 06/14/2018   AST 20 12/06/2018   ALT 12 12/06/2018   PROT 7.4 12/06/2018   ALBUMIN 4.2 06/14/2018   CALCIUM 9.8 12/06/2018   GFRAA 94 12/06/2018   QFTBGOLD Negative 06/05/2015   QFTBGOLDPLUS NEGATIVE 12/06/2018    Speciality Comments: No specialty comments available.  Procedures:  No procedures performed Allergies: Patient has no known allergies.   Assessment / Plan:     Visit Diagnoses: Positive ANA (antinuclear antibody) - 06/14/18: ANA positive, smith-, CCP5, DNA histone  0.3, LA-, anticardiolipin ab-, LDH 168, TSH 1.793, Ace 15, vitamin B12 399, folate 14.6, ferritin 52, iron 102,  -patient has positive ANA but no titer is given.  On clinical examination she has no features of autoimmune disease.  Patient and her daughter both had questions about significance of ANA which we had detailed discussion about.  I will obtain additional antibodies today.  Plan: ANA, Anti-scleroderma antibody, RNP Antibody, Anti-Smith antibody, Sjogrens  syndrome-A extractable nuclear antibody, Sjogrens syndrome-B extractable nuclear antibody, Anti-DNA antibody, double-stranded, C3 and C4  Pain in both hands -clinical and radiographic findings were consistent with osteoarthritis of the hand.  Patient gives history of intermittent swelling.  I will obtain some antibodies today.  Anti-CCP has been negative in the past.  Plan: XR Hand 2 View Right, XR Hand 2 View Left, Rheumatoid factor, 14-3-3 eta Protein  Pain in both feet -clinical and radiographic findings are consistent with osteoarthritis in the feet.  No synovitis was noted on the examination.  Plan: XR Foot 2 Views Right, XR Foot 2 Views Left.  Proper fitting shoes with arch support were discussed.  Primary osteoarthritis of both knees -patient gives history of increased knee joint discomfort and pain.  X-ray obtained today showed moderate osteoarthritis and moderate chondromalacia patella.  She has no warmth swelling or effusion in her knees.  Plan: XR KNEE 3 VIEW RIGHT, XR KNEE 3 VIEW LEFT.  Have given her a handout on knee exercises.  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-she has been followed by Dr. Grandville Silos.  Other medical problems are listed as follows:  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preiser), left ([place])  Subclinical hypothyroidism  Acute gastric ulcer due to Helicobacter pylori  Reflux esophagitis  Pulmonary hypertension ([facility])  Unstable angina ([facility])  Aortic atherosclerosis ([facility])  Hypertension goal BP (blood pressure) < 140/90-blood pressure reading is very high today.  Have advised her to monitor blood pressure closely and follow-up with her PCP.  Coronary artery disease involving native coronary artery of native heart with angina pectoris ([facility])  Mixed hyperlipidemia  Prediabetes  Herpes simplex type 2 infection  Abuse, drug or alcohol ([facility])  Episodic paroxysmal anxiety disorder  ADHD (attention deficit hyperactivity disorder), inattentive type     Orders: Orders Placed This Encounter  Procedures  . XR Hand 2 View Right  . XR Hand 2 View Left  . XR KNEE 3 VIEW RIGHT  . XR KNEE 3 VIEW LEFT  . XR Foot 2 Views Right  . XR Foot 2 Views Left  . ANA  . Anti-scleroderma antibody  . RNP Antibody  . Anti-Smith antibody  . Sjogrens syndrome-A extractable nuclear antibody  . Sjogrens syndrome-B extractable nuclear antibody  . Anti-DNA antibody, double-stranded  . C3 and C4  . Rheumatoid factor  . 14-3-3 eta Protein   No orders of the defined types were placed in this encounter.   Face-to-face time spent with patient was 60 minutes. Greater than 50% of time was spent in counseling and coordination of care.  Follow-Up Instructions: Return for Positive ANA, joint pain.   Bo Merino, MD  Note - This record has been created using Editor, commissioning.  Chart creation errors have been sought, but may not always  have been located. Such creation errors do not reflect on  the standard of medical care.

## 2019-01-11 ENCOUNTER — Other Ambulatory Visit: Payer: Self-pay

## 2019-01-11 ENCOUNTER — Ambulatory Visit: Payer: Medicare Other | Admitting: Family Medicine

## 2019-01-17 ENCOUNTER — Ambulatory Visit: Payer: Self-pay

## 2019-01-17 ENCOUNTER — Encounter: Payer: Self-pay | Admitting: Rheumatology

## 2019-01-17 ENCOUNTER — Other Ambulatory Visit: Payer: Self-pay

## 2019-01-17 ENCOUNTER — Ambulatory Visit (INDEPENDENT_AMBULATORY_CARE_PROVIDER_SITE_OTHER): Payer: Medicare Other | Admitting: Rheumatology

## 2019-01-17 VITALS — BP 165/101 | HR 97 | Resp 14 | Ht 65.0 in | Wt 216.0 lb

## 2019-01-17 DIAGNOSIS — I200 Unstable angina: Secondary | ICD-10-CM

## 2019-01-17 DIAGNOSIS — R768 Other specified abnormal immunological findings in serum: Secondary | ICD-10-CM | POA: Diagnosis not present

## 2019-01-17 DIAGNOSIS — M931 Kienbock's disease of adults: Secondary | ICD-10-CM | POA: Diagnosis not present

## 2019-01-17 DIAGNOSIS — F900 Attention-deficit hyperactivity disorder, predominantly inattentive type: Secondary | ICD-10-CM

## 2019-01-17 DIAGNOSIS — B9681 Helicobacter pylori [H. pylori] as the cause of diseases classified elsewhere: Secondary | ICD-10-CM

## 2019-01-17 DIAGNOSIS — I10 Essential (primary) hypertension: Secondary | ICD-10-CM

## 2019-01-17 DIAGNOSIS — E039 Hypothyroidism, unspecified: Secondary | ICD-10-CM

## 2019-01-17 DIAGNOSIS — F1910 Other psychoactive substance abuse, uncomplicated: Secondary | ICD-10-CM

## 2019-01-17 DIAGNOSIS — M170 Bilateral primary osteoarthritis of knee: Secondary | ICD-10-CM

## 2019-01-17 DIAGNOSIS — R7303 Prediabetes: Secondary | ICD-10-CM

## 2019-01-17 DIAGNOSIS — E038 Other specified hypothyroidism: Secondary | ICD-10-CM

## 2019-01-17 DIAGNOSIS — E782 Mixed hyperlipidemia: Secondary | ICD-10-CM

## 2019-01-17 DIAGNOSIS — M79671 Pain in right foot: Secondary | ICD-10-CM

## 2019-01-17 DIAGNOSIS — M79641 Pain in right hand: Secondary | ICD-10-CM

## 2019-01-17 DIAGNOSIS — M87242 Osteonecrosis due to previous trauma, left hand: Secondary | ICD-10-CM | POA: Diagnosis not present

## 2019-01-17 DIAGNOSIS — I700 Atherosclerosis of aorta: Secondary | ICD-10-CM

## 2019-01-17 DIAGNOSIS — M79672 Pain in left foot: Secondary | ICD-10-CM

## 2019-01-17 DIAGNOSIS — F410 Panic disorder [episodic paroxysmal anxiety] without agoraphobia: Secondary | ICD-10-CM

## 2019-01-17 DIAGNOSIS — I25119 Atherosclerotic heart disease of native coronary artery with unspecified angina pectoris: Secondary | ICD-10-CM

## 2019-01-17 DIAGNOSIS — M79642 Pain in left hand: Secondary | ICD-10-CM

## 2019-01-17 DIAGNOSIS — K253 Acute gastric ulcer without hemorrhage or perforation: Secondary | ICD-10-CM

## 2019-01-17 DIAGNOSIS — I272 Pulmonary hypertension, unspecified: Secondary | ICD-10-CM

## 2019-01-17 DIAGNOSIS — R7689 Other specified abnormal immunological findings in serum: Secondary | ICD-10-CM

## 2019-01-17 DIAGNOSIS — B009 Herpesviral infection, unspecified: Secondary | ICD-10-CM

## 2019-01-17 NOTE — Patient Instructions (Signed)
Journal for Nurse Practitioners, 15(4), 263-267. Retrieved January 18, 2018 from http://clinicalkey.com/nursing">  Knee Exercises Ask your health care provider which exercises are safe for you. Do exercises exactly as told by your health care provider and adjust them as directed. It is normal to feel mild stretching, pulling, tightness, or discomfort as you do these exercises. Stop right away if you feel sudden pain or your pain gets worse. Do not begin these exercises until told by your health care provider. Stretching and range-of-motion exercises These exercises warm up your muscles and joints and improve the movement and flexibility of your knee. These exercises also help to relieve pain and swelling. Knee extension, prone 1. Lie on your abdomen (prone position) on a bed. 2. Place your left / right knee just beyond the edge of the surface so your knee is not on the bed. You can put a towel under your left / right thigh just above your kneecap for comfort. 3. Relax your leg muscles and allow gravity to straighten your knee (extension). You should feel a stretch behind your left / right knee. 4. Hold this position for __________ seconds. 5. Scoot up so your knee is supported between repetitions. Repeat __________ times. Complete this exercise __________ times a day. Knee flexion, active  1. Lie on your back with both legs straight. If this causes back discomfort, bend your left / right knee so your foot is flat on the floor. 2. Slowly slide your left / right heel back toward your buttocks. Stop when you feel a gentle stretch in the front of your knee or thigh (flexion). 3. Hold this position for __________ seconds. 4. Slowly slide your left / right heel back to the starting position. Repeat __________ times. Complete this exercise __________ times a day. Quadriceps stretch, prone  1. Lie on your abdomen on a firm surface, such as a bed or padded floor. 2. Bend your left / right knee and hold  your ankle. If you cannot reach your ankle or pant leg, loop a belt around your foot and grab the belt instead. 3. Gently pull your heel toward your buttocks. Your knee should not slide out to the side. You should feel a stretch in the front of your thigh and knee (quadriceps). 4. Hold this position for __________ seconds. Repeat __________ times. Complete this exercise __________ times a day. Hamstring, supine 1. Lie on your back (supine position). 2. Loop a belt or towel over the ball of your left / right foot. The ball of your foot is on the walking surface, right under your toes. 3. Straighten your left / right knee and slowly pull on the belt to raise your leg until you feel a gentle stretch behind your knee (hamstring). ? Do not let your knee bend while you do this. ? Keep your other leg flat on the floor. 4. Hold this position for __________ seconds. Repeat __________ times. Complete this exercise __________ times a day. Strengthening exercises These exercises build strength and endurance in your knee. Endurance is the ability to use your muscles for a long time, even after they get tired. Quadriceps, isometric This exercise stretches the muscles in front of your thigh (quadriceps) without moving your knee joint (isometric). 1. Lie on your back with your left / right leg extended and your other knee bent. Put a rolled towel or small pillow under your knee if told by your health care provider. 2. Slowly tense the muscles in the front of your left /   right thigh. You should see your kneecap slide up toward your hip or see increased dimpling just above the knee. This motion will push the back of the knee toward the floor. 3. For __________ seconds, hold the muscle as tight as you can without increasing your pain. 4. Relax the muscles slowly and completely. Repeat __________ times. Complete this exercise __________ times a day. Straight leg raises This exercise stretches the muscles in front  of your thigh (quadriceps) and the muscles that move your hips (hip flexors). 1. Lie on your back with your left / right leg extended and your other knee bent. 2. Tense the muscles in the front of your left / right thigh. You should see your kneecap slide up or see increased dimpling just above the knee. Your thigh may even shake a bit. 3. Keep these muscles tight as you raise your leg 4-6 inches (10-15 cm) off the floor. Do not let your knee bend. 4. Hold this position for __________ seconds. 5. Keep these muscles tense as you lower your leg. 6. Relax your muscles slowly and completely after each repetition. Repeat __________ times. Complete this exercise __________ times a day. Hamstring, isometric 1. Lie on your back on a firm surface. 2. Bend your left / right knee about __________ degrees. 3. Dig your left / right heel into the surface as if you are trying to pull it toward your buttocks. Tighten the muscles in the back of your thighs (hamstring) to "dig" as hard as you can without increasing any pain. 4. Hold this position for __________ seconds. 5. Release the tension gradually and allow your muscles to relax completely for __________ seconds after each repetition. Repeat __________ times. Complete this exercise __________ times a day. Hamstring curls If told by your health care provider, do this exercise while wearing ankle weights. Begin with __________ lb weights. Then increase the weight by 1 lb (0.5 kg) increments. Do not wear ankle weights that are more than __________ lb. 1. Lie on your abdomen with your legs straight. 2. Bend your left / right knee as far as you can without feeling pain. Keep your hips flat against the floor. 3. Hold this position for __________ seconds. 4. Slowly lower your leg to the starting position. Repeat __________ times. Complete this exercise __________ times a day. Squats This exercise strengthens the muscles in front of your thigh and knee  (quadriceps). 1. Stand in front of a table, with your feet and knees pointing straight ahead. You may rest your hands on the table for balance but not for support. 2. Slowly bend your knees and lower your hips like you are going to sit in a chair. ? Keep your weight over your heels, not over your toes. ? Keep your lower legs upright so they are parallel with the table legs. ? Do not let your hips go lower than your knees. ? Do not bend lower than told by your health care provider. ? If your knee pain increases, do not bend as low. 3. Hold the squat position for __________ seconds. 4. Slowly push with your legs to return to standing. Do not use your hands to pull yourself to standing. Repeat __________ times. Complete this exercise __________ times a day. Wall slides This exercise strengthens the muscles in front of your thigh and knee (quadriceps). 1. Lean your back against a smooth wall or door, and walk your feet out 18-24 inches (46-61 cm) from it. 2. Place your feet hip-width apart. 3.   Slowly slide down the wall or door until your knees bend __________ degrees. Keep your knees over your heels, not over your toes. Keep your knees in line with your hips. 4. Hold this position for __________ seconds. Repeat __________ times. Complete this exercise __________ times a day. Straight leg raises This exercise strengthens the muscles that rotate the leg at the hip and move it away from your body (hip abductors). 1. Lie on your side with your left / right leg in the top position. Lie so your head, shoulder, knee, and hip line up. You may bend your bottom knee to help you keep your balance. 2. Roll your hips slightly forward so your hips are stacked directly over each other and your left / right knee is facing forward. 3. Leading with your heel, lift your top leg 4-6 inches (10-15 cm). You should feel the muscles in your outer hip lifting. ? Do not let your foot drift forward. ? Do not let your knee  roll toward the ceiling. 4. Hold this position for __________ seconds. 5. Slowly return your leg to the starting position. 6. Let your muscles relax completely after each repetition. Repeat __________ times. Complete this exercise __________ times a day. Straight leg raises This exercise stretches the muscles that move your hips away from the front of the pelvis (hip extensors). 1. Lie on your abdomen on a firm surface. You can put a pillow under your hips if that is more comfortable. 2. Tense the muscles in your buttocks and lift your left / right leg about 4-6 inches (10-15 cm). Keep your knee straight as you lift your leg. 3. Hold this position for __________ seconds. 4. Slowly lower your leg to the starting position. 5. Let your leg relax completely after each repetition. Repeat __________ times. Complete this exercise __________ times a day. This information is not intended to replace advice given to you by your health care provider. Make sure you discuss any questions you have with your health care provider. Document Released: 02/12/2005 Document Revised: 01/19/2018 Document Reviewed: 01/19/2018 Elsevier Patient Education  2020 [employer]  

## 2019-01-18 ENCOUNTER — Encounter: Payer: Self-pay | Admitting: Rheumatology

## 2019-01-21 ENCOUNTER — Encounter: Payer: Self-pay | Admitting: Family Medicine

## 2019-01-21 ENCOUNTER — Encounter: Payer: Self-pay | Admitting: Rheumatology

## 2019-01-22 LAB — RNP ANTIBODY: Ribonucleic Protein(ENA) Antibody, IgG: <1 AI

## 2019-01-22 LAB — ANTI-DNA ANTIBODY, DOUBLE-STRANDED: ds DNA Ab: <1 IU/mL

## 2019-01-22 LAB — SJOGRENS SYNDROME-B EXTRACTABLE NUCLEAR ANTIBODY: SSB (La) (ENA) Antibody, IgG: <1 AI

## 2019-01-22 LAB — C3 AND C4
C3 Complement: 161 mg/dL (ref 83–193)
C4 Complement: 43 mg/dL (ref 15–57)

## 2019-01-22 LAB — 14-3-3 ETA PROTEIN: 14-3-3 eta Protein: <0.2 ng/mL (ref <0.2)

## 2019-01-22 LAB — SJOGRENS SYNDROME-A EXTRACTABLE NUCLEAR ANTIBODY: SSA (Ro) (ENA) Antibody, IgG: <1 AI

## 2019-01-22 LAB — ANA: Anti Nuclear Antibody (ANA): NEGATIVE

## 2019-01-22 LAB — ANTI-SMITH ANTIBODY: ENA SM Ab Ser-aCnc: <1 AI

## 2019-01-22 LAB — ANTI-SCLERODERMA ANTIBODY: Scleroderma (Scl-70) (ENA) Antibody, IgG: <1 AI

## 2019-01-22 LAB — RHEUMATOID FACTOR: Rheumatoid fact SerPl-aCnc: <14 IU/mL (ref <14)

## 2019-01-24 ENCOUNTER — Encounter: Payer: Self-pay | Admitting: Family Medicine

## 2019-01-26 NOTE — Progress Notes (Deleted)
Office Visit Note  Patient: Linda Mejia             Date of Birth: 1955-07-19           MRN: [id number]             PCP: Hubbard Hartshorn, FNP Referring: Hubbard Hartshorn, FNP Visit Date: 02/09/2019 Occupation: @GUAROCC @  Subjective:  No chief complaint on file.   History of Present Illness: Linda Mejia is a 63 y.o. female ***   Activities of Daily Living:  Patient reports morning stiffness for *** {minute/hour:19697}.   Patient {ACTIONS;DENIES/REPORTS:21021675::"Denies"} nocturnal pain.  Difficulty dressing/grooming: {ACTIONS;DENIES/REPORTS:21021675::"Denies"} Difficulty climbing stairs: {ACTIONS;DENIES/REPORTS:21021675::"Denies"} Difficulty getting out of chair: {ACTIONS;DENIES/REPORTS:21021675::"Denies"} Difficulty using hands for taps, buttons, cutlery, and/or writing: {ACTIONS;DENIES/REPORTS:21021675::"Denies"}  No Rheumatology ROS completed.   PMFS History:  Patient Active Problem List   Diagnosis Date Noted  . 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 Other specified abnormal immunological findings in serum 09/22/2018  . 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preiser), left ([place]) 09/22/2018  . Tenosynovitis of hand 09/22/2018  . Breast mass, right 06/14/2018  . Hypoxia 05/29/2018  . Acute peptic ulcer of stomach   . Stomach irritation   . Abdominal pain, epigastric   . Acute gastric ulcer due to Helicobacter pylori   . Pulmonary hypertension ([place]) 03/09/2018  . Unstable angina ([place]) 01/23/2018  . Medication monitoring encounter 03/18/2017  . Colon cancer screening 03/18/2017  . Coronary artery calcification seen on CT scan 12/02/2016  . Aortic atherosclerosis ([place]) 12/02/2016  . Abnormal ankle brachial index (ABI) 12/02/2016  . Prediabetes 12/02/2016  . Fracture of rib 08/28/2016  . Chest pain 08/11/2016  . Hx of tobacco use, presenting hazards to health 06/09/2016  . Degenerative arthritis of knee, bilateral 12/27/2015  . Obesity 12/27/2015  .  GI bleed 09/21/2015  . Reflux esophagitis   . Other diseases of stomach and duodenum   . Polyarthralgia 06/05/2015  . Night sweats 06/05/2015  . Headache 04/12/2015  . Subclinical hypothyroidism 03/26/2015  . Herpes simplex type 2 infection 03/21/2015  . Rhinitis, allergic 03/21/2015  . ADHD (attention deficit hyperactivity disorder), inattentive type 09/20/2014  . Episodic paroxysmal anxiety disorder 09/20/2014  . History of gastric ulcer 09/20/2014  . Abuse, drug or alcohol ([place]) 09/20/2014  . HLD (hyperlipidemia) 10/03/2009  . Coronary artery disease involving native coronary artery of native heart with angina pectoris ([place]) 10/02/2009  . Hypertension goal BP (blood pressure) < 140/90 10/02/2009    Past Medical History:  Diagnosis Date  . Abnormal ankle brachial index (ABI) 12/02/2016  . ADHD (attention deficit hyperactivity disorder)   . Anxiety   . Aortic atherosclerosis ([place]) 12/02/2016   July 2018  . Chronic back pain   . Coronary artery disease   . Herpes genitalis in women   . History of stomach ulcers    x7 this year. Bleeding  . Hypertension   . Prediabetes 12/02/2016  . Pulmonary hypertension ([place]) 03/09/2018   Echo Oct 2019    Family History  Problem Relation Age of Onset  . Diabetes Mother   . CVA Mother   . Heart disease Father   . Heart disease Sister   . Breast cancer Sister   . Heart disease Brother   . Heart disease Sister   . Heart disease Sister   . Heart disease Sister   . Heart disease Brother   . Heart disease Brother   . Heart disease Brother   .  Heart disease Brother   . Healthy Daughter   . Healthy Daughter    Past Surgical History:  Procedure Laterality Date  . APPENDECTOMY    . CARDIAC CATHETERIZATION    . CESAREAN SECTION     x2  . COLON SURGERY     interception  . ESOPHAGOGASTRODUODENOSCOPY (EGD) WITH PROPOFOL N/A 09/21/2015   Procedure: ESOPHAGOGASTRODUODENOSCOPY (EGD) WITH PROPOFOL;  Surgeon: Lucilla Lame, MD;  Location: [facility]  ENDOSCOPY;  Service: Endoscopy;  Laterality: N/A;  . ESOPHAGOGASTRODUODENOSCOPY (EGD) WITH PROPOFOL N/A 07/30/2016   Procedure: ESOPHAGOGASTRODUODENOSCOPY (EGD) WITH PROPOFOL;  Surgeon: Jonathon Bellows, MD;  Location: [facility] ENDOSCOPY;  Service: Endoscopy;  Laterality: N/A;  . ESOPHAGOGASTRODUODENOSCOPY (EGD) WITH PROPOFOL N/A 04/23/2018   Procedure: ESOPHAGOGASTRODUODENOSCOPY (EGD) WITH PROPOFOL;  Surgeon: Virgel Manifold, MD;  Location: [facility] ENDOSCOPY;  Service: Endoscopy;  Laterality: N/A;  . LEFT HEART CATH Right 01/25/2018   Procedure: Left Heart Cath and Coronary Angiography;  Surgeon: Dionisio David, MD;  Location: [facility];  Service: Cardiovascular;  Laterality: Right;  . LEFT HEART CATH AND CORONARY ANGIOGRAPHY Right 08/12/2016   Procedure: Left Heart Cath and Coronary Angiography;  Surgeon: Dionisio David, MD;  Location: [facility];  Service: Cardiovascular;  Laterality: Right;  . TONSILLECTOMY    . TUBAL LIGATION     Social History   Social History Narrative  . Not on file   Immunization History  Administered Date(s) Administered  . Influenza Inj Mdck Quad Pf 12/06/2018  . Influenza,inj,Quad PF,6+ Mos 03/26/2015, 06/09/2016, 03/18/2017, 12/15/2017  . Tdap 06/09/2016     Objective: Vital Signs: There were no vitals taken for this visit.   Physical Exam   Musculoskeletal Exam: ***  CDAI Exam: CDAI Score: - Patient Global: -; Provider Global: - Swollen: -; Tender: - Joint Exam   No joint exam has been documented for this visit   There is currently no information documented on the homunculus. Go to the Rheumatology activity and complete the homunculus joint exam.  Investigation: No additional findings.  Imaging: Xr Foot 2 Views Left  Result Date: 01/17/2019 First MTP, PIP and DIP narrowing was noted.  A small inferior calcaneal spur was noted.  No erosive changes were noted.  No juxta-articular osteopenia was noted. Impression: These findings are  consistent with osteoarthritis of the foot.  Xr Foot 2 Views Right  Result Date: 01/17/2019 First MTP, PIP and DIP narrowing was noted.  No juxta-articular osteopenia was noted.  No erosive changes were noted.  Inferior and posterior calcaneal spurs were noted. Impression: These findings are consistent with osteoarthritis of the foot.  Xr Hand 2 View Left  Result Date: 01/17/2019 CMC, PIP and DIP narrowing was noted.  No MCP, intercarpal radiocarpal joint space narrowing was noted.  No erosive changes were noted. Impression: These findings are consistent with osteoarthritis of the hand.  Xr Hand 2 View Right  Result Date: 01/17/2019 PIP, DIP and CMC narrowing was noted.  No significant MCP intercarpal radiocarpal joint space narrowing was noted.  No erosive changes were noted. Impression: These findings are consistent with osteoarthritis of the hand.  Xr Knee 3 View Left  Result Date: 01/17/2019 Moderate medial compartment narrowing and moderate patellofemoral narrowing was noted.  No chondrocalcinosis was noted. Impression: These findings are consistent with moderate osteoarthritis and moderate chondromalacia patella.  Xr Knee 3 View Right  Result Date: 01/17/2019 Moderate medial compartment narrowing was noted.  Moderate patellofemoral narrowing was noted.  No chondrocalcinosis was noted. Impression: These  findings are consistent with moderate osteoarthritis and moderate chondromalacia patella.   Recent Labs: Lab Results  Component Value Date   WBC 8.2 12/06/2018   HGB 14.2 12/06/2018   PLT 400 12/06/2018   NA 142 12/06/2018   K 5.0 12/06/2018   CL 102 12/06/2018   CO2 33 (H) 12/06/2018   GLUCOSE 103 (H) 12/06/2018   BUN 21 12/06/2018   CREATININE 0.78 12/06/2018   BILITOT 0.3 12/06/2018   ALKPHOS 57 06/14/2018   AST 20 12/06/2018   ALT 12 12/06/2018   PROT 7.4 12/06/2018   ALBUMIN 4.2 06/14/2018   CALCIUM 9.8 12/06/2018   GFRAA 94 12/06/2018   QFTBGOLD Negative  06/05/2015   QFTBGOLDPLUS NEGATIVE 12/06/2018  January 17, 2019 ANA negative, ENA negative, C3-C4 normal, RF negative, 14 3 3  eta negative  Speciality Comments: No specialty comments available.  Procedures:  No procedures performed Allergies: Patient has no known allergies.   Assessment / Plan:     Visit Diagnoses: No diagnosis found.  Orders: No orders of the defined types were placed in this encounter.  No orders of the defined types were placed in this encounter.   Face-to-face time spent with patient was *** minutes. Greater than 50% of time was spent in counseling and coordination of care.  Follow-Up Instructions: No follow-ups on file.   Bo Merino, MD  Note - This record has been created using Editor, commissioning.  Chart creation errors have been sought, but may not always  have been located. Such creation errors do not reflect on  the standard of medical care.

## 2019-01-27 ENCOUNTER — Encounter: Payer: Self-pay | Admitting: Emergency Medicine

## 2019-01-27 ENCOUNTER — Emergency Department: Payer: Medicare Other

## 2019-01-27 ENCOUNTER — Observation Stay
Admission: EM | Admit: 2019-01-27 | Discharge: 2019-01-29 | Disposition: A | Discharge status: Home / Self Care | Payer: Medicare Other | Attending: Internal Medicine | Admitting: Internal Medicine

## 2019-01-27 ENCOUNTER — Other Ambulatory Visit: Payer: Self-pay

## 2019-01-27 DIAGNOSIS — M79604 Pain in right leg: Secondary | ICD-10-CM | POA: Insufficient documentation

## 2019-01-27 DIAGNOSIS — Z79899 Other long term (current) drug therapy: Secondary | ICD-10-CM | POA: Diagnosis not present

## 2019-01-27 DIAGNOSIS — Z20828 Contact with and (suspected) exposure to other viral communicable diseases: Secondary | ICD-10-CM | POA: Diagnosis not present

## 2019-01-27 DIAGNOSIS — M170 Bilateral primary osteoarthritis of knee: Secondary | ICD-10-CM | POA: Insufficient documentation

## 2019-01-27 DIAGNOSIS — G629 Polyneuropathy, unspecified: Secondary | ICD-10-CM | POA: Insufficient documentation

## 2019-01-27 DIAGNOSIS — Z8249 Family history of ischemic heart disease and other diseases of the circulatory system: Secondary | ICD-10-CM | POA: Diagnosis not present

## 2019-01-27 DIAGNOSIS — M79605 Pain in left leg: Principal | ICD-10-CM | POA: Insufficient documentation

## 2019-01-27 DIAGNOSIS — Z66 Do not resuscitate: Secondary | ICD-10-CM | POA: Diagnosis not present

## 2019-01-27 DIAGNOSIS — I2510 Atherosclerotic heart disease of native coronary artery without angina pectoris: Secondary | ICD-10-CM | POA: Diagnosis not present

## 2019-01-27 DIAGNOSIS — Z87891 Personal history of nicotine dependence: Secondary | ICD-10-CM | POA: Diagnosis not present

## 2019-01-27 DIAGNOSIS — M7071 Other bursitis of hip, right hip: Secondary | ICD-10-CM | POA: Insufficient documentation

## 2019-01-27 DIAGNOSIS — F329 Major depressive disorder, single episode, unspecified: Secondary | ICD-10-CM | POA: Diagnosis not present

## 2019-01-27 DIAGNOSIS — F909 Attention-deficit hyperactivity disorder, unspecified type: Secondary | ICD-10-CM | POA: Insufficient documentation

## 2019-01-27 DIAGNOSIS — I10 Essential (primary) hypertension: Secondary | ICD-10-CM | POA: Insufficient documentation

## 2019-01-27 DIAGNOSIS — Z7951 Long term (current) use of inhaled steroids: Secondary | ICD-10-CM | POA: Diagnosis not present

## 2019-01-27 DIAGNOSIS — I272 Pulmonary hypertension, unspecified: Secondary | ICD-10-CM | POA: Insufficient documentation

## 2019-01-27 DIAGNOSIS — I878 Other specified disorders of veins: Secondary | ICD-10-CM | POA: Diagnosis not present

## 2019-01-27 DIAGNOSIS — Z7982 Long term (current) use of aspirin: Secondary | ICD-10-CM | POA: Diagnosis not present

## 2019-01-27 DIAGNOSIS — F411 Generalized anxiety disorder: Secondary | ICD-10-CM | POA: Diagnosis not present

## 2019-01-27 DIAGNOSIS — L0390 Cellulitis, unspecified: Secondary | ICD-10-CM | POA: Diagnosis present

## 2019-01-27 LAB — CBC
HCT: 39.2 % (ref 36.0–46.0)
Hemoglobin: 12.9 g/dL (ref 12.0–15.0)
MCH: 30.6 pg (ref 26.0–34.0)
MCHC: 32.9 g/dL (ref 30.0–36.0)
MCV: 92.9 fL (ref 80.0–100.0)
Platelets: 383 10*3/uL (ref 150–400)
RBC: 4.22 MIL/uL (ref 3.87–5.11)
RDW: 12.8 % (ref 11.5–15.5)
WBC: 7.8 10*3/uL (ref 4.0–10.5)
nRBC: 0 % (ref 0.0–0.2)

## 2019-01-27 LAB — BASIC METABOLIC PANEL
Anion gap: 8 (ref 5–15)
BUN: 21 mg/dL (ref 8–23)
CO2: 27 mmol/L (ref 22–32)
Calcium: 8.7 mg/dL — ABNORMAL LOW (ref 8.9–10.3)
Chloride: 101 mmol/L (ref 98–111)
Creatinine, Ser: 0.89 mg/dL (ref 0.44–1.00)
GFR calc Af Amer: >60 mL/min (ref >60)
GFR calc non Af Amer: >60 mL/min (ref >60)
Glucose, Bld: 109 mg/dL — ABNORMAL HIGH (ref 70–99)
Potassium: 4.3 mmol/L (ref 3.5–5.1)
Sodium: 136 mmol/L (ref 135–145)

## 2019-01-27 LAB — BRAIN NATRIURETIC PEPTIDE: B Natriuretic Peptide: 41 pg/mL (ref 0.0–100.0)

## 2019-01-27 LAB — TROPONIN I (HIGH SENSITIVITY): Troponin I (High Sensitivity): 4 ng/L (ref <18)

## 2019-01-27 NOTE — ED Triage Notes (Signed)
Pt in via POV, reports ongoing shortness of breath and bilateral leg swelling x several weeks.  Pt also reports body aches.  NAD notd at this time.

## 2019-01-28 ENCOUNTER — Emergency Department: Payer: Medicare Other

## 2019-01-28 DIAGNOSIS — F339 Major depressive disorder, recurrent, unspecified: Secondary | ICD-10-CM

## 2019-01-28 DIAGNOSIS — F411 Generalized anxiety disorder: Secondary | ICD-10-CM

## 2019-01-28 DIAGNOSIS — F909 Attention-deficit hyperactivity disorder, unspecified type: Secondary | ICD-10-CM

## 2019-01-28 DIAGNOSIS — L0390 Cellulitis, unspecified: Secondary | ICD-10-CM | POA: Diagnosis present

## 2019-01-28 LAB — TSH: TSH: 2.146 u[IU]/mL (ref 0.350–4.500)

## 2019-01-28 LAB — SARS CORONAVIRUS 2 (TAT 6-24 HRS): SARS Coronavirus 2: NEGATIVE

## 2019-01-28 LAB — HIV ANTIBODY (ROUTINE TESTING W REFLEX): HIV Screen 4th Generation wRfx: NONREACTIVE

## 2019-01-28 MED ORDER — ENOXAPARIN SODIUM 40 MG/0.4ML ~~LOC~~ SOLN
40.0000 mg | SUBCUTANEOUS | Status: DC
  Administered 2019-01-29: 40 mg via SUBCUTANEOUS
  Filled 2019-01-28: qty 0.4

## 2019-01-28 MED ORDER — ACETAMINOPHEN 325 MG PO TABS
650.0000 mg | ORAL_TABLET | Freq: Four times a day (QID) | ORAL | Status: DC | PRN
  Administered 2019-01-28: 650 mg via ORAL
  Filled 2019-01-28 (×2): qty 2

## 2019-01-28 MED ORDER — DIAZEPAM 5 MG PO TABS: 10.0000 mg | ORAL_TABLET | Freq: Three times a day (TID) | ORAL | Status: DC | PRN

## 2019-01-28 MED ORDER — GABAPENTIN 100 MG PO CAPS
100.0000 mg | ORAL_CAPSULE | Freq: Three times a day (TID) | ORAL | Status: DC
  Administered 2019-01-28 – 2019-01-29 (×3): 100 mg via ORAL
  Filled 2019-01-28 (×3): qty 1

## 2019-01-28 MED ORDER — ACETAMINOPHEN 650 MG RE SUPP: 650.0000 mg | Freq: Four times a day (QID) | RECTAL | Status: DC | PRN

## 2019-01-28 MED ORDER — CEPHALEXIN 500 MG PO CAPS
500.0000 mg | ORAL_CAPSULE | Freq: Two times a day (BID) | ORAL | Status: DC
  Administered 2019-01-28: 500 mg via ORAL
  Filled 2019-01-28: qty 1

## 2019-01-28 MED ORDER — IBUPROFEN 400 MG PO TABS
400.0000 mg | ORAL_TABLET | Freq: Four times a day (QID) | ORAL | Status: DC | PRN
  Administered 2019-01-28: 400 mg via ORAL
  Filled 2019-01-28: qty 1

## 2019-01-28 MED ORDER — ALUM & MAG HYDROXIDE-SIMETH 200-200-20 MG/5ML PO SUSP
30.0000 mL | Freq: Four times a day (QID) | ORAL | Status: DC | PRN
  Administered 2019-01-28: 30 mL via ORAL
  Filled 2019-01-28: qty 30

## 2019-01-28 MED ORDER — IOHEXOL 350 MG/ML SOLN
75.0000 mL | Freq: Once | INTRAVENOUS | Status: AC | PRN
  Administered 2019-01-28: 75 mL via INTRAVENOUS

## 2019-01-28 MED ORDER — ONDANSETRON HCL 4 MG/2ML IJ SOLN: 4.0000 mg | Freq: Four times a day (QID) | INTRAMUSCULAR | Status: DC | PRN

## 2019-01-28 MED ORDER — CEPHALEXIN 500 MG PO CAPS
500.0000 mg | ORAL_CAPSULE | Freq: Four times a day (QID) | ORAL | Status: DC
  Administered 2019-01-28 – 2019-01-29 (×3): 500 mg via ORAL
  Filled 2019-01-28 (×3): qty 1

## 2019-01-28 MED ORDER — SODIUM CHLORIDE 0.9 % IV SOLN
1.0000 g | Freq: Once | INTRAVENOUS | Status: AC
  Administered 2019-01-28: 1 g via INTRAVENOUS
  Filled 2019-01-28: qty 10

## 2019-01-28 MED ORDER — ONDANSETRON HCL 4 MG PO TABS: 4.0000 mg | ORAL_TABLET | Freq: Four times a day (QID) | ORAL | Status: DC | PRN

## 2019-01-28 MED ORDER — DOCUSATE SODIUM 100 MG PO CAPS
100.0000 mg | ORAL_CAPSULE | Freq: Two times a day (BID) | ORAL | Status: DC
  Administered 2019-01-28 – 2019-01-29 (×3): 100 mg via ORAL
  Filled 2019-01-28 (×3): qty 1

## 2019-01-28 MED ORDER — CYANOCOBALAMIN 1000 MCG/ML IJ SOLN
1000.0000 ug | Freq: Once | INTRAMUSCULAR | Status: AC
  Administered 2019-01-28: 1000 ug via INTRAMUSCULAR
  Filled 2019-01-28: qty 1

## 2019-01-28 NOTE — Care Management Obs Status (Signed)
[facility] NOTIFICATION   Patient Details  Name: Linda Mejia MRN: [id number] Date of Birth: [birth year]   Medicare Observation Status Notification Given:  Yes    Su Hilt, RN 01/28/2019, 4:09 PM

## 2019-01-28 NOTE — ED Notes (Signed)
Attempted to call report

## 2019-01-28 NOTE — Consult Note (Signed)
Reason for Consult: Leg swelling  Referring Physician: Hospitalist Dr. Manus Gunning Wilson Singer   HPI: 63 year old white female.  History from patient and chart review.  Difficult historian.  Hard to answer direct questions History of hypertension.  She was started a year ago on amlodipine For the last year she has had episodic swelling in her legs.  Usually from the knee down.  She has not worn stockings.  It may come and go.  Has varicosities.  No prior procedure on the legs.  Recent venous Doppler rule out DVT  Feet burn.  Particularly at night.  Feels like she has no cushion.  Usually worse when the legs are swollen.  Prediabetic.  Joint pain.  "All over" but in particular knees and feet and right hip.  Has had 2 recent rheumatology evaluations.  Films show moderate osteoarthritis of the knees.  Some of the hands and the feet.  Sometimes the thumb CMC's will bother her.  Serologies negative recently.  1+ ANA with negative ENA's.  3 - ANAs  Had cough and fever with hospitalization in the winter.  Pleural effusion.  Thought to be septic.  Postpneumonic.  Occasionally has wheezing.  Echo at that time showed moderate to severe MR normal ejection fraction.  CT at that time raised the question of right breast mass.  She says she has had mammogram in the last year  Hand pain in the last few months on the left.  Found to have avascular necrosis.  Saw Copy.  No surgery.  has worn splint  PMH: Anxiety.  Osteoarthritis.  Esophagitis gastritis and ulcers.  Hypertension.  Obesity.  SURGICAL HISTORY: No joint surgeries  Family History: Negative for connective tissue disease  Social History: No significant alcohol.  No cigarettes  Allergies: No Known Allergies  Medications:  Scheduled: . [START ON 01/29/2019] cephALEXin  500 mg Oral Q6H  . docusate sodium  100 mg Oral BID  . enoxaparin (LOVENOX) injection  40 mg Subcutaneous Q24H        ROS: No abdominal pain.  No chest pain.  No muscle  weakness.  CTA did not show parenchymal disease or pulmonary arterial enlargement   PHYSICAL EXAM: Blood pressure 125/69, pulse 97, temperature 98.3 F (36.8 C), temperature source Oral, resp. rate 19, height 5\' 5"  (1.651 m), weight 96.2 kg, SpO2 95 %. Anxious female.  Hard to sit still.  Hard to answer direct questions.  Skin is mild diffusely flushed.  Blanches.  There are some purpuric lesions in the forearms from prior trauma by report none in the legs.  No erythematous nodules of the legs.  Sclera clear.  No thyromegaly.  Clear chest.  I do not hear MR or systolic murmur.  No visceromegaly Varicosities of the lower extremities.  Trace edema.  Good distal pulses Musculoskeletal: Mild decreased range of motion cervical spine.  Shoulders move well.  Hands mild hypertrophic changes no inflammatory synovitis.  Decreased extension left wrist.  Decreased flexion-extension lumbar spine.  Right trochanter bursa is tender.  Both hips move well.  Left knee has small cool effusion.  Right knee has small Baker's cyst.  Ankles and toes without definite synovitis  Neurological: Grip is 5/5.  Neck flexors and biceps triceps are 5/5.  She can raise her legs against pressure.  She can stand on her heels and her toes.  Absent knee and ankle jerks.  Normal soft touch in the feet  Assessment: Venous stasis.  Has not had a trial  of stockings Episodic lower extremity edema.  Possible contribution of Norvasc Osteoarthritis.  Most predominantly in the knees.  Mildly and hands and ankles Avascular necrosis of the left wrist Peripheral neuropathy.  Mainly sensory.  Feet burning at night.  Absent ankle jerks Significant MR based on prior echo.  Normal ejection Hypertension Anxiety Right hip bursitis Prior gastritis-gastric ulcers-esophagitis Flushing ?rt breast mass on 05/29/18 Chest CT  Recommendations: Encourage support stockings.  Might do well with fitted stockings.  Might eventually need vascular  evaluation  May need work-up of neuropathy.  In the short run, one can get B12 level and consider trial of Neurontin.  As outpatient she might need EMGs  Consider switching her Norvasc to another antihypertensive given her lower extremity edema  She is getting injections in her knees from orthopedics.    Could supplement with Tylenol.  Would avoid nonsteroidals given her gastric disease  Could inject right hip bursa on down the road  Will need breast exam and up to dat emammogram if not already done since 2/20 CT  Liliane Bade W 01/28/2019, 2:00 PM

## 2019-01-28 NOTE — Progress Notes (Signed)
Pt refused Valium this shift. Says she is not anxious "just excited."

## 2019-01-28 NOTE — ED Notes (Signed)
Pt transported by this tech via wheelchair to rm 149. Pt ambulatory; pts belongings with pt in 1 pt belongings bag.

## 2019-01-28 NOTE — H&P (Signed)
Linda Mejia is an 63 y.o. female.   Chief Complaint: Leg swelling HPI: The patient with past medical history of hypertension, metabolic syndrome, coronary artery disease, pulmonary hypertension and anxiety presents to the emergency department complaining of lower extremity swelling.  The patient reports that it has been waxing and waning for several days.  This makes her very nervous due to a prolonged ICU hospitalization in February of this year secondary to cellulitis and sepsis.  She denies fever, nausea or vomiting.  She admits that her ankles swell at the end of each day.  She tries to stay active with household chores throughout the day.  She admits to some body aches but denies congestion or shortness of breath.  (Notably, she did admit to shortness of breath at triage).  Denies travel risk factors or known contact with individuals with exposure to novel coronavirus.  Due to the appearance of her lower extremities the patient received ceftriaxone in the emergency department prior to the hospital service being called for admission.   Past Medical History:  Diagnosis Date  . Abnormal ankle brachial index (ABI) 12/02/2016  . ADHD (attention deficit hyperactivity disorder)   . Anxiety   . Aortic atherosclerosis ([place]) 12/02/2016   July 2018  . Chronic back pain   . Coronary artery disease   . Herpes genitalis in women   . History of stomach ulcers    x7 this year. Bleeding  . Hypertension   . Prediabetes 12/02/2016  . Pulmonary hypertension ([place]) 03/09/2018   Echo Oct 2019    Past Surgical History:  Procedure Laterality Date  . APPENDECTOMY    . CARDIAC CATHETERIZATION    . CESAREAN SECTION     x2  . COLON SURGERY     interception  . ESOPHAGOGASTRODUODENOSCOPY (EGD) WITH PROPOFOL N/A 09/21/2015   Procedure: ESOPHAGOGASTRODUODENOSCOPY (EGD) WITH PROPOFOL;  Surgeon: Lucilla Lame, MD;  Location: [facility];  Service: Endoscopy;  Laterality: N/A;  . ESOPHAGOGASTRODUODENOSCOPY (EGD) WITH  PROPOFOL N/A 07/30/2016   Procedure: ESOPHAGOGASTRODUODENOSCOPY (EGD) WITH PROPOFOL;  Surgeon: Jonathon Bellows, MD;  Location: [facility];  Service: Endoscopy;  Laterality: N/A;  . ESOPHAGOGASTRODUODENOSCOPY (EGD) WITH PROPOFOL N/A 04/23/2018   Procedure: ESOPHAGOGASTRODUODENOSCOPY (EGD) WITH PROPOFOL;  Surgeon: Virgel Manifold, MD;  Location: [facility];  Service: Endoscopy;  Laterality: N/A;  . LEFT HEART CATH Right 01/25/2018   Procedure: Left Heart Cath and Coronary Angiography;  Surgeon: Dionisio David, MD;  Location: [place] [facility];  Service: Cardiovascular;  Laterality: Right;  . LEFT HEART CATH AND CORONARY ANGIOGRAPHY Right 08/12/2016   Procedure: Left Heart Cath and Coronary Angiography;  Surgeon: Dionisio David, MD;  Location: [facility];  Service: Cardiovascular;  Laterality: Right;  . TONSILLECTOMY    . TUBAL LIGATION      Family History  Problem Relation Age of Onset  . Diabetes Mother   . CVA Mother   . Heart disease Father   . Heart disease Sister   . Breast cancer Sister   . Heart disease Brother   . Heart disease Sister   . Heart disease Sister   . Heart disease Sister   . Heart disease Brother   . Heart disease Brother   . Heart disease Brother   . Heart disease Brother   . Healthy Daughter   . Healthy Daughter    Social History:  reports that she quit smoking about 14 years ago. Her smoking use included cigarettes. She has a 30.00 pack-year smoking  history. She has never used smokeless tobacco. She reports current alcohol use. She reports that she does not use drugs.  Allergies: No Known Allergies  Medications Prior to Admission  Medication Sig Dispense Refill  . acyclovir (ZOVIRAX) 400 MG tablet take 1 tablet by mouth twice a day (Patient taking differently: Take 400 mg by mouth 2 (two) times daily. ) 60 tablet 5  . Alirocumab (PRALUENT) 75 MG/ML SOAJ Inject 1 each into the skin every 14 (fourteen) days. 6 pen 3  . amLODipine (NORVASC) 5  MG tablet TAKE 1 TABLET(5 MG) BY MOUTH DAILY FOR BLOOD PRESSURE (Patient taking differently: Take 5 mg by mouth daily. ) 30 tablet 5  . amphetamine-dextroamphetamine (ADDERALL) 30 MG tablet Take 30 mg by mouth 2 (two) times daily.     Marland Kitchen aspirin EC 81 MG tablet Take 81 mg by mouth daily.    . cholecalciferol (VITAMIN D) 1000 units tablet Take 1,000 Units by mouth daily.    . cyanocobalamin 1000 MCG tablet Take 1,000 mcg by mouth daily.    . diazepam (VALIUM) 10 MG tablet Take 1 tablet (10 mg total) by mouth every 8 (eight) hours as needed for anxiety. 30 tablet 0  . fluticasone (FLONASE) 50 MCG/ACT nasal spray Place 2 sprays into both nostrils daily as needed for allergies.    . metoprolol tartrate (LOPRESSOR) 50 MG tablet TAKE 1 TABLET BY MOUTH TWICE A DAY (Patient taking differently: Take 50 mg by mouth 2 (two) times daily. ) 60 tablet 5  . nitroGLYCERIN (NITROSTAT) 0.4 MG SL tablet Place 1 tablet (0.4 mg total) under the tongue every 5 (five) minutes as needed for chest pain. 30 tablet 0  . nystatin (MYCOSTATIN/NYSTOP) powder Apply topically 2 (two) times daily. (Patient taking differently: Apply topically as needed (rash). ) 15 g 0  . Omega-3 Fatty Acids (FISH OIL) 1000 MG CAPS Take 1 capsule by mouth daily.      Results for orders placed or performed during the hospital encounter of 01/27/19 (from the past 48 hour(s))  CBC     Status: None   Collection Time: 01/27/19  9:28 PM  Result Value Ref Range   WBC 7.8 4.0 - 10.5 K/uL   RBC 4.22 3.87 - 5.11 MIL/uL   Hemoglobin 12.9 12.0 - 15.0 g/dL   HCT 39.2 36.0 - 46.0 %   MCV 92.9 80.0 - 100.0 fL   MCH 30.6 26.0 - 34.0 pg   MCHC 32.9 30.0 - 36.0 g/dL   RDW 12.8 11.5 - 15.5 %   Platelets 383 150 - 400 K/uL   nRBC 0.0 0.0 - 0.2 %    Comment: Performed at [facility], [address] [place], [place] [id number]  Basic metabolic panel     Status: Abnormal   Collection Time: 01/27/19  9:28 PM  Result Value Ref Range   Sodium 136 135  - 145 mmol/L   Potassium 4.3 3.5 - 5.1 mmol/L   Chloride 101 98 - 111 mmol/L   CO2 27 22 - 32 mmol/L   Glucose, Bld 109 (H) 70 - 99 mg/dL   BUN 21 8 - 23 mg/dL   Creatinine, Ser 0.89 0.44 - 1.00 mg/dL   Calcium 8.7 (L) 8.9 - 10.3 mg/dL   GFR calc non Af Amer >60 >60 mL/min   GFR calc Af Amer >60 >60 mL/min   Anion gap 8 5 - 15    Comment: Performed at [facility], [address] [place],  [place] [address]  Brain natriuretic peptide     Status: None   Collection Time: 01/27/19  9:28 PM  Result Value Ref Range   B Natriuretic Peptide 41.0 0.0 - 100.0 pg/mL    Comment: Performed at [facility], [place], [place] [address]  Troponin I (High Sensitivity)     Status: None   Collection Time: 01/27/19  9:28 PM  Result Value Ref Range   Troponin I (High Sensitivity) 4 <18 ng/L    Comment: (NOTE) Elevated high sensitivity troponin I (hsTnI) values and significant  changes across serial measurements may suggest ACS but many other  chronic and acute conditions are known to elevate hsTnI results.  Refer to the "Links" section for chest pain algorithms and additional  guidance. Performed at [facility], [address], [place], [place] [address]    Dg Chest 2 View  Result Date: 01/27/2019 CLINICAL DATA:  Shortness of breath. Leg swelling. Body aches. EXAM: CHEST - 2 VIEW COMPARISON:  06/14/2018 FINDINGS: Hyperinflation. Midline trachea. Normal heart size. Atherosclerosis in the transverse aorta. Moderate pulmonary interstitial thickening. Left lower lobe scarring laterally. IMPRESSION: Hyperinflation and interstitial thickening, most consistent with COPD/chronic bronchitis. No acute superimposed process. Electronically Signed   By: Abigail Miyamoto M.D.   On: 01/27/2019 21:59   Ct Angio Chest Pe W And/or Wo Contrast  Result Date: 01/28/2019 CLINICAL DATA:  63 year old female with shortness of breath and lower extremity swelling. EXAM: CT  ANGIOGRAPHY CHEST WITH CONTRAST TECHNIQUE: Multidetector CT imaging of the chest was performed using the standard protocol during bolus administration of intravenous contrast. Multiplanar CT image reconstructions and MIPs were obtained to evaluate the vascular anatomy. CONTRAST:  66mL OMNIPAQUE IOHEXOL 350 MG/ML SOLN COMPARISON:  CTA chest 05/28/2018. Chest radiographs 01/27/2019. FINDINGS: Cardiovascular: Good contrast bolus timing in the pulmonary arterial tree. No focal filling defect identified in the pulmonary arteries to suggest acute pulmonary embolism. Calcified coronary artery atherosclerosis and/or stents. Negative visible aorta aside from atherosclerosis. Stable borderline to mild cardiomegaly, no pericardial effusion. Mediastinum/Nodes: Negative. Mediastinal lymph nodes today appear within normal limits, were reactive appearing in February. Lungs/Pleura: Pleural effusions in February have resolved. Major airways are patent. Mild dependent opacity in both lungs most resembles atelectasis, including that which is more confluent in the posterior costophrenic angles on series 6, image 69. No consolidation. Upper Abdomen: Negative visible liver, gallbladder, spleen, pancreas, adrenal glands, kidneys, and bowel in the upper abdomen. Musculoskeletal: Stable advanced degenerative changes in the lower thoracic spine. Occasional chronic rib fractures. No acute osseous abnormality identified. Review of the MIP images confirms the above findings. IMPRESSION: 1. Negative for acute pulmonary embolus. 2. Resolved pleural effusions since February. Mild pulmonary atelectasis. 3. Calcified coronary artery atherosclerosis. Electronically Signed   By: Genevie Ann M.D.   On: 01/28/2019 01:25   US Venous Img Lower Bilateral  Result Date: 01/28/2019 CLINICAL DATA:  Bilateral lower extremity edema and pain EXAM: BILATERAL LOWER EXTREMITY VENOUS DOPPLER ULTRASOUND TECHNIQUE: Gray-scale sonography with graded compression, as  well as color Doppler and duplex ultrasound were performed to evaluate the lower extremity deep venous systems from the level of the common femoral vein and including the common femoral, femoral, profunda femoral, popliteal and calf veins including the posterior tibial, peroneal and gastrocnemius veins when visible. The superficial great saphenous vein was also interrogated. Spectral Doppler was utilized to evaluate flow at rest and with distal augmentation maneuvers in the common femoral, femoral and popliteal veins. COMPARISON:  None. FINDINGS:  RIGHT LOWER EXTREMITY Common Femoral Vein: No evidence of thrombus. Normal compressibility, respiratory phasicity and response to augmentation. Saphenofemoral Junction: No evidence of thrombus. Normal compressibility and flow on color Doppler imaging. Profunda Femoral Vein: No evidence of thrombus. Normal compressibility and flow on color Doppler imaging. Femoral Vein: No evidence of thrombus. Normal compressibility, respiratory phasicity and response to augmentation. Popliteal Vein: No evidence of thrombus. Normal compressibility, respiratory phasicity and response to augmentation. Calf Veins: No evidence of thrombus. Normal compressibility and flow on color Doppler imaging. Superficial Great Saphenous Vein: No evidence of thrombus. Normal compressibility. LEFT LOWER EXTREMITY Common Femoral Vein: No evidence of thrombus. Normal compressibility, respiratory phasicity and response to augmentation. Saphenofemoral Junction: No evidence of thrombus. Normal compressibility and flow on color Doppler imaging. Profunda Femoral Vein: No evidence of thrombus. Normal compressibility and flow on color Doppler imaging. Femoral Vein: No evidence of thrombus. Normal compressibility, respiratory phasicity and response to augmentation. Popliteal Vein: No evidence of thrombus. Normal compressibility, respiratory phasicity and response to augmentation. Calf Veins: No evidence of thrombus.  Normal compressibility and flow on color Doppler imaging. Superficial Great Saphenous Vein: No evidence of thrombus. Normal compressibility. IMPRESSION: No evidence of deep venous thrombosis in either lower extremity. Electronically Signed   By: Donavan Foil M.D.   On: 01/28/2019 01:15    Review of Systems  Constitutional: Negative for chills and fever.  HENT: Negative for sore throat and tinnitus.   Eyes: Negative for blurred vision and redness.  Respiratory: Negative for cough and shortness of breath.   Cardiovascular: Positive for leg swelling. Negative for chest pain, palpitations, orthopnea and PND.  Gastrointestinal: Negative for abdominal pain, diarrhea, nausea and vomiting.  Genitourinary: Negative for dysuria, frequency and urgency.  Musculoskeletal: Negative for joint pain and myalgias.  Skin: Negative for rash.       No lesions  Neurological: Negative for speech change, focal weakness and weakness.  Endo/Heme/Allergies: Does not bruise/bleed easily.       No temperature intolerance  Psychiatric/Behavioral: Negative for depression and suicidal ideas.    Blood pressure 125/69, pulse 97, temperature 98.3 F (36.8 C), temperature source Oral, resp. rate 19, height 5\' 5"  (1.651 m), weight 96.2 kg, SpO2 95 %. Physical Exam  Vitals reviewed. Constitutional: She is oriented to person, place, and time. She appears well-developed and well-nourished. No distress.  HENT:  Head: Normocephalic and atraumatic.  Mouth/Throat: Oropharynx is clear and moist.  Eyes: Pupils are equal, round, and reactive to light. Conjunctivae and EOM are normal. No scleral icterus.  Neck: Normal range of motion. Neck supple. No JVD present. No tracheal deviation present. No thyromegaly present.  Cardiovascular: Normal rate, regular rhythm and normal heart sounds. Exam reveals no gallop and no friction rub.  No murmur heard. Respiratory: Effort normal and breath sounds normal.  GI: Soft. Bowel sounds are  normal. She exhibits no distension. There is no abdominal tenderness.  Genitourinary:    Genitourinary Comments: Deferred   Musculoskeletal: Normal range of motion.        General: No edema.  Lymphadenopathy:    She has no cervical adenopathy.  Neurological: She is alert and oriented to person, place, and time. No cranial nerve deficit. She exhibits normal muscle tone.  Skin: Skin is warm and dry. No rash noted. There is erythema (Mild).  Psychiatric: Her behavior is normal. Judgment and thought content normal. Her mood appears anxious.     Assessment/Plan This is a 63 year old female admitted for cellulitis. 1.  Cellulitis: Mild; lower  extremities bilaterally.  The patient is received ceftriaxone.  She has no signs or symptoms of sepsis.  She is able to take p.o. medications.  Thus we will transition her to oral antibiotics.  She had an episode of severe cellulitis earlier this year which has made her very anxious.  Etiology of recurrent cellulitis likely secondary to venous stasis dermatitis and subsequent infection. 2.  Mood disorder: The patient is very tearful, anxious and admits to some depression due to multiple physical ailments.  I have asked the psychiatry service to consult.  She does not have any suicidal or homicidal ideation 3.  Hypertension: Controlled; and amlodipine 4.  Coronary artery disease: Stable; continue aspirin.  Nitroglycerin as needed. 5.  DVT prophylaxis: Lovenox 6.  GI prophylaxis: None The patient is a full code.  Time spent on admission orders and patient care approximately 45 minutes  Harrie Foreman, MD 01/28/2019, 8:30 AM

## 2019-01-28 NOTE — Progress Notes (Signed)
Same day rounding progress note  This is a 63 year old female admitted for polymyalgia and anxiety  Patient seem frustrated, anxious, was agreeable to have psychiatry and rheumatology evaluation while here  *Generalized anxiety disorder/ADHD -Appreciate psych input -Started Valium 10 mg p.o. 3 times daily as needed  *Bilateral lower extremity pain/swelling/joint pain -Appreciate Dr. Kernodle/rheumatology input -We will stop Norvasc, avoid nonsteroidals -Trial of gabapentin for neuropathy -B12 injection once -If no improvement, will consult vascular surgery  * Cellulitis: Mild; lower extremities bilaterally.   -Keflex  * Hypertension: Well-controlled, hold off Norvasc due to lower extremity swelling  * Coronary artery disease: Stable; continue aspirin.  Nitroglycerin as needed.  Time spent: 15 minutes

## 2019-01-28 NOTE — ED Notes (Signed)
Pt states this swelling and shortness of breath has been going on for months. She has seen her primary care doctor and a rheumatoid specialist with no answers

## 2019-01-28 NOTE — Consult Note (Signed)
[facility] Consult   Reason for Consult: Anxiety and depression Referring Physician:  Manuella Ghazi Patient Identification: Linda Mejia MRN:  [id number] Principal Diagnosis: <principal problem not specified> Diagnosis:  Active Problems:   Cellulitis   Total Time spent with patient: 30 minutes  Subjective:   Linda Mejia is a 63 y.o. female patient admitted with anxiety.  HPI: Patient is seen and examined.  Patient is a 63 year old female with a past psychiatric history significant for cellulitis and lower extremity edema who was admitted on 01/28/2019 secondary to lower extremity swelling.  The patient stated that she has swelling in her legs, and has been coming and going for the last several days.  She is frustrated over the fact that no one can explain to her why she gets the swelling in her legs.  She has been treated with Valium as well as Adderall in the past from her primary care physician, and psychiatric consultation was requested.  Patient stated she has been treated with diazepam for over 20 years.  She stated that she had been attempted to be treated with medications like Paxil, Prozac and Zoloft but was unable to tolerate them or they were not effective.  She is followed at [facility] in [place] by Dr Court Joy.  She stated that most recently he had attempted to treat her with venlafaxine, but she did not tolerate it.  She stated that she often will not take the Valium, but that she has it available 3 times a day.  She admitted to a previous hospitalization in the past after her husband had died and there were some issues with her daughter.  She prefers not to discuss those.  Most of her anxiety and frustration at this point are secondary to the fact that she does not believe that her physicians are explaining to her why she continues to have the swelling in her lower extremities.  She denied any suicidal or homicidal ideation.  She denied any auditory or visual  hallucinations.  Review of the PMP database revealed her last diazepam prescription to be 10 mg p.o. 3 times daily standing from Dr.Su.  She also receives Adderall 30 mg tablets 75 tablets a month also from Dr. Kasandra Knudsen for attention deficit hyperactivity disorder.  Past Psychiatric History: Patient is followed at [facility] in [facility] for her psychiatric issues.  She stated she had 1 previous psychiatric hospitalization many years ago after her husband died, and her daughter was having "problems".  She stated she prefers not to discuss that at this time. Risk to Self: Is patient at risk for suicide?: No Risk to Others:   Prior Inpatient Therapy:   Prior Outpatient Therapy:    Past Medical History:  Past Medical History:  Diagnosis Date  . Abnormal ankle brachial index (ABI) 12/02/2016  . ADHD (attention deficit hyperactivity disorder)   . Anxiety   . Aortic atherosclerosis ([place]) 12/02/2016   July 2018  . Chronic back pain   . Coronary artery disease   . Herpes genitalis in women   . History of stomach ulcers    x7 this year. Bleeding  . Hypertension   . Prediabetes 12/02/2016  . Pulmonary hypertension ([place]) 03/09/2018   Echo Oct 2019    Past Surgical History:  Procedure Laterality Date  . APPENDECTOMY    . CARDIAC CATHETERIZATION    . CESAREAN SECTION     x2  . COLON SURGERY     interception  . ESOPHAGOGASTRODUODENOSCOPY (  EGD) WITH PROPOFOL N/A 09/21/2015   Procedure: ESOPHAGOGASTRODUODENOSCOPY (EGD) WITH PROPOFOL;  Surgeon: Lucilla Lame, MD;  Location: [facility] ENDOSCOPY;  Service: Endoscopy;  Laterality: N/A;  . ESOPHAGOGASTRODUODENOSCOPY (EGD) WITH PROPOFOL N/A 07/30/2016   Procedure: ESOPHAGOGASTRODUODENOSCOPY (EGD) WITH PROPOFOL;  Surgeon: Jonathon Bellows, MD;  Location: [facility] ENDOSCOPY;  Service: Endoscopy;  Laterality: N/A;  . ESOPHAGOGASTRODUODENOSCOPY (EGD) WITH PROPOFOL N/A 04/23/2018   Procedure: ESOPHAGOGASTRODUODENOSCOPY (EGD) WITH PROPOFOL;  Surgeon: Virgel Manifold, MD;  Location: [facility] ENDOSCOPY;  Service: Endoscopy;  Laterality: N/A;  . LEFT HEART CATH Right 01/25/2018   Procedure: Left Heart Cath and Coronary Angiography;  Surgeon: Dionisio David, MD;  Location: [place] [facility];  Service: Cardiovascular;  Laterality: Right;  . LEFT HEART CATH AND CORONARY ANGIOGRAPHY Right 08/12/2016   Procedure: Left Heart Cath and Coronary Angiography;  Surgeon: Dionisio David, MD;  Location: [place] [facility];  Service: Cardiovascular;  Laterality: Right;  . TONSILLECTOMY    . TUBAL LIGATION     Family History:  Family History  Problem Relation Age of Onset  . Diabetes Mother   . CVA Mother   . Heart disease Father   . Heart disease Sister   . Breast cancer Sister   . Heart disease Brother   . Heart disease Sister   . Heart disease Sister   . Heart disease Sister   . Heart disease Brother   . Heart disease Brother   . Heart disease Brother   . Heart disease Brother   . Healthy Daughter   . Healthy Daughter    Family Psychiatric  History: Noncontributory Social History:  Social History   Substance and Sexual Activity  Alcohol Use Yes  . Frequency: Never     Social History   Substance and Sexual Activity  Drug Use No    Social History   Socioeconomic History  . Marital status: Divorced    Spouse name: Not on file  . Number of children: 2  . Years of education: Not on file  . Highest education level: GED or equivalent  Occupational History  . Occupation: disabled  Social Needs  . Financial resource strain: Not hard at all  . Food insecurity    Worry: Never true    Inability: Never true  . Transportation needs    Medical: No    Non-medical: No  Tobacco Use  . Smoking status: Former Smoker    Packs/day: 1.00    Years: 30.00    Pack years: 30.00    Types: Cigarettes    Quit date: 2006    Years since quitting: 14.8  . Smokeless tobacco: Never Used  Substance and Sexual Activity  . Alcohol use: Yes    Frequency: Never   . Drug use: No  . Sexual activity: Not Currently    Birth control/protection: None, Post-menopausal  Lifestyle  . Physical activity    Days per week: 0 days    Minutes per session: 0 min  . Stress: Not at all  Relationships  . Social Herbalist on phone: Patient refused    Gets together: Patient refused    Attends religious service: Patient refused    Active member of club or organization: Patient refused    Attends meetings of clubs or organizations: Patient refused    Relationship status: Divorced  Other Topics Concern  . Not on file  Social History Narrative  . Not on file   Additional Social History:    Allergies:  No Known Allergies  Labs:  Results for orders placed or performed during the hospital encounter of 01/27/19 (from the past 48 hour(s))  CBC     Status: None   Collection Time: 01/27/19  9:28 PM  Result Value Ref Range   WBC 7.8 4.0 - 10.5 K/uL   RBC 4.22 3.87 - 5.11 MIL/uL   Hemoglobin 12.9 12.0 - 15.0 g/dL   HCT 39.2 36.0 - 46.0 %   MCV 92.9 80.0 - 100.0 fL   MCH 30.6 26.0 - 34.0 pg   MCHC 32.9 30.0 - 36.0 g/dL   RDW 12.8 11.5 - 15.5 %   Platelets 383 150 - 400 K/uL   nRBC 0.0 0.0 - 0.2 %    Comment: Performed at [facility], [address] [place], [place] [id number]  Basic metabolic panel     Status: Abnormal   Collection Time: 01/27/19  9:28 PM  Result Value Ref Range   Sodium 136 135 - 145 mmol/L   Potassium 4.3 3.5 - 5.1 mmol/L   Chloride 101 98 - 111 mmol/L   CO2 27 22 - 32 mmol/L   Glucose, Bld 109 (H) 70 - 99 mg/dL   BUN 21 8 - 23 mg/dL   Creatinine, Ser 0.89 0.44 - 1.00 mg/dL   Calcium 8.7 (L) 8.9 - 10.3 mg/dL   GFR calc non Af Amer >60 >60 mL/min   GFR calc Af Amer >60 >60 mL/min   Anion gap 8 5 - 15    Comment: Performed at [facility], [address] [place], [place] [address]  Brain natriuretic peptide     Status: None   Collection Time: 01/27/19  9:28 PM  Result Value Ref Range   B Natriuretic  Peptide 41.0 0.0 - 100.0 pg/mL    Comment: Performed at [facility], [place], [place] [address]  Troponin I (High Sensitivity)     Status: None   Collection Time: 01/27/19  9:28 PM  Result Value Ref Range   Troponin I (High Sensitivity) 4 <18 ng/L    Comment: (NOTE) Elevated high sensitivity troponin I (hsTnI) values and significant  changes across serial measurements may suggest ACS but many other  chronic and acute conditions are known to elevate hsTnI results.  Refer to the "Links" section for chest pain algorithms and additional  guidance. Performed at [facility], [address], [place], [place] [address]     Current Facility-Administered Medications  Medication Dose Route Frequency Provider Last Rate Last Dose  . acetaminophen (TYLENOL) tablet 650 mg  650 mg Oral Q6H PRN Harrie Foreman, MD       Or  . acetaminophen (TYLENOL) suppository 650 mg  650 mg Rectal Q6H PRN Harrie Foreman, MD      . cephALEXin (KEFLEX) capsule 500 mg  500 mg Oral Q12H Harrie Foreman, MD      . docusate sodium (COLACE) capsule 100 mg  100 mg Oral BID Harrie Foreman, MD      . enoxaparin (LOVENOX) injection 40 mg  40 mg Subcutaneous Q24H Harrie Foreman, MD      . ondansetron [facility]) tablet 4 mg  4 mg Oral Q6H PRN Harrie Foreman, MD       Or  . ondansetron [facility]) injection 4 mg  4 mg Intravenous Q6H PRN Harrie Foreman, MD        Musculoskeletal: Strength & Muscle Tone: within normal limits Gait &  Station: N/A Patient leans: N/A  Psychiatric Specialty Exam: Physical Exam  Nursing note and vitals reviewed. Constitutional: She is oriented to person, place, and time. She appears well-developed and well-nourished.  HENT:  Head: Normocephalic and atraumatic.  Respiratory: Effort normal.  Neurological: She is alert and oriented to person, place, and time.    ROS  Blood pressure 125/69, pulse 97, temperature 98.3 F (36.8 C),  temperature source Oral, resp. rate 19, height 5\' 5"  (1.651 m), weight 96.2 kg, SpO2 95 %.Body mass index is 35.28 kg/m.  General Appearance: Casual  Eye Contact:  Fair  Speech:  Normal Rate  Volume:  Normal  Mood:  Anxious  Affect:  Congruent  Thought Process:  Coherent and Descriptions of Associations: Intact  Orientation:  Full (Time, Place, and Person)  Thought Content:  Logical  Suicidal Thoughts:  No  Homicidal Thoughts:  No  Memory:  Immediate;   Fair Recent;   Fair Remote;   Fair  Judgement:  Intact  Insight:  Fair  Psychomotor Activity:  Increased  Concentration:  Concentration: Fair and Attention Span: Fair  Recall:  [employer] of Knowledge:  Fair  Language:  Fair  Akathisia:  Negative  Handed:  Right  AIMS (if indicated):     Assets:  Desire for Improvement Resilience  ADL's:  Intact  Cognition:  WNL  Sleep:        Treatment Plan Summary: Daily contact with patient to assess and evaluate symptoms and progress in treatment, Medication management and Plan : Patient is seen and examined.  Patient is a 64 year old female with a past psychiatric history significant for generalized anxiety as well as attention deficit disorder.  Disposition: No evidence of imminent risk to self or others at present.   Patient does not meet criteria for psychiatric inpatient admission.   Patient is seen and examined.  Patient is a 63 year old female with the above-stated past psychiatric history who was seen in consultation.  She is more anxious today mainly because the fact that her Valium was not continued on admission.  She is also frustrated over the fact that apparently she does not feel as though she gets any decent explanations for the swelling in her legs.  I have gone on and taken the liberty to write Valium 10 mg p.o. 3 times daily as needed, and I think that should be sufficient.  She refuses any SSRI or SNRI medications.  She is followed at the [facility] in  [place], and will follow up with them  Diagnosis: #1 generalized anxiety disorder, #2 attention deficit hyperactivity disorder.  Sharma Covert, MD 01/28/2019 9:40 AM

## 2019-01-29 LAB — CBC
HCT: 38.4 % (ref 36.0–46.0)
Hemoglobin: 12.1 g/dL (ref 12.0–15.0)
MCH: 30 pg (ref 26.0–34.0)
MCHC: 31.5 g/dL (ref 30.0–36.0)
MCV: 95 fL (ref 80.0–100.0)
Platelets: 302 10*3/uL (ref 150–400)
RBC: 4.04 MIL/uL (ref 3.87–5.11)
RDW: 12.8 % (ref 11.5–15.5)
WBC: 6.5 10*3/uL (ref 4.0–10.5)
nRBC: 0 % (ref 0.0–0.2)

## 2019-01-29 LAB — BASIC METABOLIC PANEL
Anion gap: 4 — ABNORMAL LOW (ref 5–15)
BUN: 19 mg/dL (ref 8–23)
CO2: 29 mmol/L (ref 22–32)
Calcium: 8.4 mg/dL — ABNORMAL LOW (ref 8.9–10.3)
Chloride: 106 mmol/L (ref 98–111)
Creatinine, Ser: 0.63 mg/dL (ref 0.44–1.00)
GFR calc Af Amer: >60 mL/min (ref >60)
GFR calc non Af Amer: >60 mL/min (ref >60)
Glucose, Bld: 116 mg/dL — ABNORMAL HIGH (ref 70–99)
Potassium: 4.4 mmol/L (ref 3.5–5.1)
Sodium: 139 mmol/L (ref 135–145)

## 2019-01-29 MED ORDER — GABAPENTIN 100 MG PO CAPS: 100.0000 mg | ORAL_CAPSULE | Freq: Three times a day (TID) | ORAL | 0 refills | Status: DC

## 2019-01-29 NOTE — Progress Notes (Signed)
Discharge instructions reviewed with patient. Verbalized understanding. IV removed. Stable condition. Patient wanted to eat lunch then she will be ready to be wheeled out.

## 2019-01-29 NOTE — Discharge Summary (Signed)
[facility] [place], 63 y.o., DOB [birth year], MRN [id number]. Admission date: 01/27/2019 Discharge Date 01/29/2019 Primary MD Hubbard Hartshorn, FNP Admitting Physician Harrie Foreman, MD  Admission Diagnosis  Shob body aches  Discharge Diagnosis    Bilateral lower extremity pain and swelling Generalized anxiety disorder/ADHD Cellulitis ruled out Hypertension Coronary artery disease    Hospital Course  The patient with past medical history of hypertension, metabolic syndrome, coronary artery disease, pulmonary hypertension and anxiety presents to the emergency department complaining of lower extremity swelling.  The patient reports that it has been waxing and waning for several days.  This makes her very nervous due to a prolonged ICU hospitalization in February of this year secondary to cellulitis and sepsis.    Patient was started on antibiotics for cellulitis however there is no evidence of cellulitis.  She was seen by rheumatology who made some recommendations.  She will need outpatient follow-up.  Patient swelling is now resolved.  She is stable for discharge.           Consults  rhem    Dg Chest 2 View  Result Date: 01/27/2019 CLINICAL DATA:  Shortness of breath. Leg swelling. Body aches. EXAM: CHEST - 2 VIEW COMPARISON:  06/14/2018 FINDINGS: Hyperinflation. Midline trachea. Normal heart size. Atherosclerosis in the transverse aorta. Moderate pulmonary interstitial thickening. Left lower lobe scarring laterally. IMPRESSION: Hyperinflation and interstitial thickening, most consistent with COPD/chronic bronchitis. No acute superimposed process. Electronically Signed   By: Abigail Miyamoto M.D.   On: 01/27/2019 21:59   Ct Angio Chest Pe W And/or Wo Contrast  Result Date: 01/28/2019 CLINICAL DATA:  63 year old female with shortness of breath and lower extremity swelling. EXAM: CT ANGIOGRAPHY CHEST WITH CONTRAST TECHNIQUE: Multidetector  CT imaging of the chest was performed using the standard protocol during bolus administration of intravenous contrast. Multiplanar CT image reconstructions and MIPs were obtained to evaluate the vascular anatomy. CONTRAST:  2mL OMNIPAQUE IOHEXOL 350 MG/ML SOLN COMPARISON:  CTA chest 05/28/2018. Chest radiographs 01/27/2019. FINDINGS: Cardiovascular: Good contrast bolus timing in the pulmonary arterial tree. No focal filling defect identified in the pulmonary arteries to suggest acute pulmonary embolism. Calcified coronary artery atherosclerosis and/or stents. Negative visible aorta aside from atherosclerosis. Stable borderline to mild cardiomegaly, no pericardial effusion. Mediastinum/Nodes: Negative. Mediastinal lymph nodes today appear within normal limits, were reactive appearing in February. Lungs/Pleura: Pleural effusions in February have resolved. Major airways are patent. Mild dependent opacity in both lungs most resembles atelectasis, including that which is more confluent in the posterior costophrenic angles on series 6, image 69. No consolidation. Upper Abdomen: Negative visible liver, gallbladder, spleen, pancreas, adrenal glands, kidneys, and bowel in the upper abdomen. Musculoskeletal: Stable advanced degenerative changes in the lower thoracic spine. Occasional chronic rib fractures. No acute osseous abnormality identified. Review of the MIP images confirms the above findings. IMPRESSION: 1. Negative for acute pulmonary embolus. 2. Resolved pleural effusions since February. Mild pulmonary atelectasis. 3. Calcified coronary artery atherosclerosis. Electronically Signed   By: Genevie Ann M.D.   On: 01/28/2019 01:25   US Venous Img Lower Bilateral  Result Date: 01/28/2019 CLINICAL DATA:  Bilateral lower extremity edema and pain EXAM: BILATERAL LOWER EXTREMITY VENOUS DOPPLER ULTRASOUND TECHNIQUE: Gray-scale sonography with graded compression, as well as color Doppler and duplex ultrasound were performed  to evaluate the lower extremity deep venous systems from the level of the common femoral vein and including the common femoral, femoral, profunda femoral, popliteal and  calf veins including the posterior tibial, peroneal and gastrocnemius veins when visible. The superficial great saphenous vein was also interrogated. Spectral Doppler was utilized to evaluate flow at rest and with distal augmentation maneuvers in the common femoral, femoral and popliteal veins. COMPARISON:  None. FINDINGS: RIGHT LOWER EXTREMITY Common Femoral Vein: No evidence of thrombus. Normal compressibility, respiratory phasicity and response to augmentation. Saphenofemoral Junction: No evidence of thrombus. Normal compressibility and flow on color Doppler imaging. Profunda Femoral Vein: No evidence of thrombus. Normal compressibility and flow on color Doppler imaging. Femoral Vein: No evidence of thrombus. Normal compressibility, respiratory phasicity and response to augmentation. Popliteal Vein: No evidence of thrombus. Normal compressibility, respiratory phasicity and response to augmentation. Calf Veins: No evidence of thrombus. Normal compressibility and flow on color Doppler imaging. Superficial Great Saphenous Vein: No evidence of thrombus. Normal compressibility. LEFT LOWER EXTREMITY Common Femoral Vein: No evidence of thrombus. Normal compressibility, respiratory phasicity and response to augmentation. Saphenofemoral Junction: No evidence of thrombus. Normal compressibility and flow on color Doppler imaging. Profunda Femoral Vein: No evidence of thrombus. Normal compressibility and flow on color Doppler imaging. Femoral Vein: No evidence of thrombus. Normal compressibility, respiratory phasicity and response to augmentation. Popliteal Vein: No evidence of thrombus. Normal compressibility, respiratory phasicity and response to augmentation. Calf Veins: No evidence of thrombus. Normal compressibility and flow on color Doppler imaging.  Superficial Great Saphenous Vein: No evidence of thrombus. Normal compressibility. IMPRESSION: No evidence of deep venous thrombosis in either lower extremity. Electronically Signed   By: Donavan Foil M.D.   On: 01/28/2019 01:15   Xr Foot 2 Views Left  Result Date: 01/17/2019 First MTP, PIP and DIP narrowing was noted.  A small inferior calcaneal spur was noted.  No erosive changes were noted.  No juxta-articular osteopenia was noted. Impression: These findings are consistent with osteoarthritis of the foot.  Xr Foot 2 Views Right  Result Date: 01/17/2019 First MTP, PIP and DIP narrowing was noted.  No juxta-articular osteopenia was noted.  No erosive changes were noted.  Inferior and posterior calcaneal spurs were noted. Impression: These findings are consistent with osteoarthritis of the foot.  Xr Hand 2 View Left  Result Date: 01/17/2019 CMC, PIP and DIP narrowing was noted.  No MCP, intercarpal radiocarpal joint space narrowing was noted.  No erosive changes were noted. Impression: These findings are consistent with osteoarthritis of the hand.  Xr Hand 2 View Right  Result Date: 01/17/2019 PIP, DIP and CMC narrowing was noted.  No significant MCP intercarpal radiocarpal joint space narrowing was noted.  No erosive changes were noted. Impression: These findings are consistent with osteoarthritis of the hand.  Xr Knee 3 View Left  Result Date: 01/17/2019 Moderate medial compartment narrowing and moderate patellofemoral narrowing was noted.  No chondrocalcinosis was noted. Impression: These findings are consistent with moderate osteoarthritis and moderate chondromalacia patella.  Xr Knee 3 View Right  Result Date: 01/17/2019 Moderate medial compartment narrowing was noted.  Moderate patellofemoral narrowing was noted.  No chondrocalcinosis was noted. Impression: These findings are consistent with moderate osteoarthritis and moderate chondromalacia patella.      Today   Subjective:    Linda Mejia patient doing well denies any complaints Objective:   Blood pressure 123/85, pulse 91, temperature 97.8 F (36.6 C), temperature source Oral, resp. rate 16, height 5\' 5"  (1.651 m), weight 96.9 kg, SpO2 95 %.  .  Intake/Output Summary (Last 24 hours) at 01/29/2019 1334 Last data filed at 01/29/2019 0900 Gross per 24  hour  Intake 480 ml  Output -  Net 480 ml    Exam VITAL SIGNS: Blood pressure 123/85, pulse 91, temperature 97.8 F (36.6 C), temperature source Oral, resp. rate 16, height 5\' 5"  (1.651 m), weight 96.9 kg, SpO2 95 %.  GENERAL:  63 y.o.-year-old patient lying in the bed with no acute distress.  EYES: Pupils equal, round, reactive to light and accommodation. No scleral icterus. Extraocular muscles intact.  HEENT: Head atraumatic, normocephalic. Oropharynx and nasopharynx clear.  NECK:  Supple, no jugular venous distention. No thyroid enlargement, no tenderness.  LUNGS: Normal breath sounds bilaterally, no wheezing, rales,rhonchi or crepitation. No use of accessory muscles of respiration.  CARDIOVASCULAR: S1, S2 normal. No murmurs, rubs, or gallops.  ABDOMEN: Soft, nontender, nondistended. Bowel sounds present. No organomegaly or mass.  EXTREMITIES: No pedal edema, cyanosis, or clubbing.  NEUROLOGIC: Cranial nerves II through XII are intact. Muscle strength 5/5 in all extremities. Sensation intact. Gait not checked.  PSYCHIATRIC: The patient is alert and oriented x 3.  SKIN: No obvious rash, lesion, or ulcer.   Data Review     CBC w Diff:  Lab Results  Component Value Date   WBC 6.5 01/29/2019   HGB 12.1 01/29/2019   HGB 12.6 03/16/2018   HCT 38.4 01/29/2019   HCT 37.9 06/05/2015   PLT 302 01/29/2019   PLT 314 06/05/2015   LYMPHOPCT 42 06/14/2018   LYMPHOPCT 51.4 08/03/2014   BANDSPCT 14 05/18/2018   MONOPCT 10.7 12/06/2018   MONOPCT 13.3 08/03/2014   EOSPCT 3.2 12/06/2018   EOSPCT 4.2 08/03/2014   BASOPCT 1.0 12/06/2018   BASOPCT 1.4  08/03/2014   CMP:  Lab Results  Component Value Date   NA 139 01/29/2019   NA 144 06/05/2015   NA 142 08/03/2014   K 4.4 01/29/2019   K 3.6 08/03/2014   CL 106 01/29/2019   CL 108 08/03/2014   CO2 29 01/29/2019   CO2 27 08/03/2014   BUN 19 01/29/2019   BUN 13 06/05/2015   BUN 15 08/03/2014   CREATININE 0.63 01/29/2019   CREATININE 0.78 12/06/2018   PROT 7.4 12/06/2018   PROT 6.9 08/03/2014   ALBUMIN 4.2 06/14/2018   ALBUMIN 3.9 08/03/2014   BILITOT 0.3 12/06/2018   BILITOT <0.1 (L) 08/03/2014   ALKPHOS 57 06/14/2018   ALKPHOS 63 08/03/2014   AST 20 12/06/2018   AST 24 08/03/2014   ALT 12 12/06/2018   ALT 12 (L) 08/03/2014  .  Micro Results Recent Results (from the past 240 hour(s))  SARS CORONAVIRUS 2 (TAT 6-24 HRS) Nasopharyngeal Nasopharyngeal Swab     Status: None   Collection Time: 01/27/19 11:59 PM   Specimen: Nasopharyngeal Swab  Result Value Ref Range Status   SARS Coronavirus 2 NEGATIVE NEGATIVE Final    Comment: (NOTE) SARS-CoV-2 target nucleic acids are NOT DETECTED. The SARS-CoV-2 RNA is generally detectable in upper and lower respiratory specimens during the acute phase of infection. Negative results do not preclude SARS-CoV-2 infection, do not rule out co-infections with other pathogens, and should not be used as the sole basis for treatment or other patient management decisions. Negative results must be combined with clinical observations, patient history, and epidemiological information. The expected result is Negative. Fact Sheet for Patients: SugarRoll.be Fact Sheet for Healthcare Providers: https://www.woods-mathews.com/ This test is not yet approved or cleared by the [place] FDA and  has been authorized for detection and/or diagnosis of SARS-CoV-2 by FDA under an Emergency Use Authorization (EUA).  This EUA will remain  in effect (meaning this test can be used) for the duration of the COVID-19  declaration under Section 56 4(b)(1) of the Act, 21 U.S.C. section 360bbb-3(b)(1), unless the authorization is terminated or revoked sooner. Performed at [facility], [place] [address], [place], [place] [address]         Code Status Orders  (From admission, onward)         Start     Ordered   01/28/19 0635  Full code  Continuous     01/28/19 0634        Code Status History    Date Active Date Inactive Code Status Order ID Comments User Context   05/29/2018 0535 06/01/2018 2022 Full Code [id number]  [name], MD ED   05/16/2018 1325 05/25/2018 1349 Full Code [id number]  [name], RN Inpatient   05/16/2018 1112 05/16/2018 1324 DNR [id number]  [name], MD Inpatient   01/23/2018 2319 01/26/2018 1715 Full Code [id number]  [name], MD Inpatient   08/11/2016 1909 08/12/2016 1840 Full Code [id number]  [name], MD Inpatient   07/29/2016 1533 07/30/2016 2117 Full Code [id number]  [name], MD ED   09/21/2015 0229 09/22/2015 1418 Full Code [id number]  Saundra Shelling, MD Inpatient   Advance Care Planning Activity          Follow-up Information    Hubbard Hartshorn, FNP Follow up in 6 day(s).   Specialty: Family Medicine Contact information: [address] [place] [address] [phone]        Emmaline Kluver., MD Follow up in 1 week(s).   Specialty: Rheumatology Why: hosp f/u Contact information: [place] [address] [phone]           Discharge Medications   Allergies as of 01/29/2019   No Known Allergies     Medication List    STOP taking these medications   amLODipine 5 MG tablet Commonly known as: NORVASC   cyanocobalamin 1000 MCG tablet     TAKE these medications   acyclovir 400 MG tablet Commonly known as: ZOVIRAX take 1 tablet by mouth twice a day Notes to patient: Not given in hospital   amphetamine-dextroamphetamine 30 MG tablet Commonly known  as: ADDERALL Take 30 mg by mouth 2 (two) times daily. Notes to patient: Not given in hospital   aspirin EC 81 MG tablet Take 81 mg by mouth daily. Notes to patient: Not given in hospital   cholecalciferol 1000 units tablet Commonly known as: VITAMIN D Take 1,000 Units by mouth daily. Notes to patient: Not given in hospital   diazepam 10 MG tablet Commonly known as: VALIUM Take 1 tablet (10 mg total) by mouth every 8 (eight) hours as needed for anxiety. Notes to patient: Not given in hospital   Fish Oil 1000 MG Caps Take 1 capsule by mouth daily. Notes to patient: Not given in hospital   fluticasone 50 MCG/ACT nasal spray Commonly known as: FLONASE Place 2 sprays into both nostrils daily as needed for allergies. Notes to patient: Not given in hospital   gabapentin 100 MG capsule Commonly known as: NEURONTIN Take 1 capsule (100 mg total) by mouth 3 (three) times daily. Notes to patient: Last dose given today at 10:09 AM   metoprolol tartrate 50 MG tablet Commonly known as: LOPRESSOR TAKE 1 TABLET BY MOUTH TWICE A DAY Notes to patient: Not given in hospital   nitroGLYCERIN 0.4  MG SL tablet Commonly known as: NITROSTAT Place 1 tablet (0.4 mg total) under the tongue every 5 (five) minutes as needed for chest pain. Notes to patient: Not given in hospital   nystatin powder Commonly known as: MYCOSTATIN/NYSTOP Apply topically 2 (two) times daily. What changed:   when to take this  reasons to take this Notes to patient: Not given in hospital   Praluent 75 MG/ML Soaj Generic drug: Alirocumab Inject 1 each into the skin every 14 (fourteen) days. Notes to patient: Not given in hospital          Total Time in preparing paper work, data evaluation and todays exam - 29 minutes  Dustin Flock M.D on 01/29/2019 at [place]  [phone]

## 2019-01-31 ENCOUNTER — Encounter: Payer: Self-pay | Admitting: Family Medicine

## 2019-02-01 ENCOUNTER — Ambulatory Visit: Payer: Medicare Other | Admitting: Family Medicine

## 2019-02-07 ENCOUNTER — Other Ambulatory Visit: Payer: Self-pay

## 2019-02-07 ENCOUNTER — Encounter: Payer: Self-pay | Admitting: Family Medicine

## 2019-02-07 ENCOUNTER — Telehealth: Payer: Self-pay | Admitting: Family Medicine

## 2019-02-07 NOTE — Chronic Care Management (AMB) (Signed)
Chronic Care Management   Note  02/07/2019 Name: Linda Mejia MRN: [id number] DOB: 07-19-1955  Linda Mejia is a 63 y.o. year old female who is a primary care patient of Hubbard Hartshorn, FNP. I reached out to [name] by phone today in response to a referral sent by Linda Mejia's health plan.     [name] was given information about Chronic Care Management services today including:  1. CCM service includes personalized support from designated clinical staff supervised by her physician, including individualized plan of care and coordination with other care providers 2. 24/7 contact phone numbers for assistance for urgent and routine care needs. 3. Service will only be billed when office clinical staff spend 20 minutes or more in a month to coordinate care. 4. Only one practitioner may furnish and bill the service in a calendar month. 5. The patient may stop CCM services at any time (effective at the end of the month) by phone call to the office staff. 6. The patient will be responsible for cost sharing (co-pay) of up to 20% of the service fee (after annual deductible is met).  Patient did not agree to enrollment in care management services and does not wish to consider at this time.  Follow up plan: The patient has been provided with contact information for the chronic care management team and has been advised to call with any health related questions or concerns.   [place]  [email or web address]   ??[phone]

## 2019-02-08 ENCOUNTER — Encounter: Payer: Self-pay | Admitting: Rheumatology

## 2019-02-08 MED ORDER — FLUTICASONE PROPIONATE 50 MCG/ACT NA SUSP: 2.0000 | Freq: Every day | NASAL | 11 refills | Status: DC | PRN

## 2019-02-08 NOTE — Telephone Encounter (Signed)
Okay to schedule virtual or by phone visit

## 2019-02-09 ENCOUNTER — Encounter: Payer: Self-pay | Admitting: Rheumatology

## 2019-02-09 ENCOUNTER — Other Ambulatory Visit: Payer: Self-pay

## 2019-02-09 ENCOUNTER — Encounter: Payer: Medicare Other | Admitting: Rheumatology

## 2019-02-09 DIAGNOSIS — K253 Acute gastric ulcer without hemorrhage or perforation: Secondary | ICD-10-CM

## 2019-02-09 DIAGNOSIS — F1910 Other psychoactive substance abuse, uncomplicated: Secondary | ICD-10-CM

## 2019-02-09 DIAGNOSIS — M931 Kienbock's disease of adults: Secondary | ICD-10-CM

## 2019-02-09 DIAGNOSIS — R768 Other specified abnormal immunological findings in serum: Secondary | ICD-10-CM

## 2019-02-09 DIAGNOSIS — M19072 Primary osteoarthritis, left ankle and foot: Secondary | ICD-10-CM

## 2019-02-09 DIAGNOSIS — F410 Panic disorder [episodic paroxysmal anxiety] without agoraphobia: Secondary | ICD-10-CM

## 2019-02-09 DIAGNOSIS — M19041 Primary osteoarthritis, right hand: Secondary | ICD-10-CM

## 2019-02-09 DIAGNOSIS — B009 Herpesviral infection, unspecified: Secondary | ICD-10-CM

## 2019-02-09 DIAGNOSIS — I10 Essential (primary) hypertension: Secondary | ICD-10-CM

## 2019-02-09 DIAGNOSIS — B9681 Helicobacter pylori [H. pylori] as the cause of diseases classified elsewhere: Secondary | ICD-10-CM

## 2019-02-09 DIAGNOSIS — E782 Mixed hyperlipidemia: Secondary | ICD-10-CM

## 2019-02-09 DIAGNOSIS — M170 Bilateral primary osteoarthritis of knee: Secondary | ICD-10-CM

## 2019-02-09 DIAGNOSIS — R7689 Other specified abnormal immunological findings in serum: Secondary | ICD-10-CM

## 2019-02-09 DIAGNOSIS — I272 Pulmonary hypertension, unspecified: Secondary | ICD-10-CM

## 2019-02-09 DIAGNOSIS — F900 Attention-deficit hyperactivity disorder, predominantly inattentive type: Secondary | ICD-10-CM

## 2019-02-09 DIAGNOSIS — I25119 Atherosclerotic heart disease of native coronary artery with unspecified angina pectoris: Secondary | ICD-10-CM

## 2019-02-09 DIAGNOSIS — E039 Hypothyroidism, unspecified: Secondary | ICD-10-CM

## 2019-02-09 DIAGNOSIS — E038 Other specified hypothyroidism: Secondary | ICD-10-CM

## 2019-02-09 DIAGNOSIS — I200 Unstable angina: Secondary | ICD-10-CM

## 2019-02-09 DIAGNOSIS — I700 Atherosclerosis of aorta: Secondary | ICD-10-CM

## 2019-02-09 DIAGNOSIS — R7303 Prediabetes: Secondary | ICD-10-CM

## 2019-02-09 DIAGNOSIS — M19071 Primary osteoarthritis, right ankle and foot: Secondary | ICD-10-CM

## 2019-02-09 NOTE — Progress Notes (Deleted)
Virtual Visit via Telephone Note  I connected with Linda Mejia on 02/09/19 at  4:00 PM EDT by telephone and verified that I am speaking with the correct person using two identifiers.  Location: Patient: Home  Provider: Clinic  This service was conducted via virtual visit.    The patient was located at home. I was located in my office.  Consent was obtained prior to the virtual visit and is aware of possible charges through their insurance for this visit.  The patient is an established patient.  Dr. Estanislado Pandy, MD conducted the virtual visit and Hazel Sams, PA-C acted as scribe during the service.  Office staff helped with scheduling follow up visits after the service was conducted.   I discussed the limitations, risks, security and privacy concerns of performing an evaluation and management service by telephone and the availability of in person appointments. I also discussed with the patient that there may be a patient responsible charge related to this service. The patient expressed understanding and agreed to proceed.  CC: History of Present Illness: Patient is a 63 year old female with a past medical history of positive ANA and osteoarthritis.    Review of Systems  Constitutional: Negative for fever and malaise/fatigue.  Eyes: Negative for photophobia, pain, discharge and redness.  Respiratory: Negative for cough, shortness of breath and wheezing.   Cardiovascular: Negative for chest pain and palpitations.  Gastrointestinal: Negative for blood in stool, constipation and diarrhea.  Genitourinary: Negative for dysuria.  Musculoskeletal: Negative for back pain, joint pain, myalgias and neck pain.  Skin: Negative for rash.  Neurological: Negative for dizziness and headaches.  Psychiatric/Behavioral: Negative for depression. The patient is not nervous/anxious and does not have insomnia.       Observations/Objective: Physical Exam  Constitutional: She is oriented to person, place, and  time.  Neurological: She is alert and oriented to person, place, and time.  Psychiatric: Mood, memory, affect and judgment normal.   Patient reports morning stiffness for  {minute/hour:19697}.   Patient {Actions; denies-reports:120008} nocturnal pain.  Difficulty dressing/grooming: {ACTIONS;DENIES/REPORTS:21021675::"Denies"} Difficulty climbing stairs: {ACTIONS;DENIES/REPORTS:21021675::"Denies"} Difficulty getting out of chair: {ACTIONS;DENIES/REPORTS:21021675::"Denies"} Difficulty using hands for taps, buttons, cutlery, and/or writing: {ACTIONS;DENIES/REPORTS:21021675::"Denies"} Component     Latest Ref Rng & Units 01/17/2019  C3 Complement     83 - 193 mg/dL 161  C4 Complement     15 - 57 mg/dL 43  Anti Nuclear Antibody (ANA)     NEGATIVE NEGATIVE  Scleroderma (Scl-70) (ENA) Antibody, IgG     <1.0 NEG AI <1.0 NEG  Ribonucleic Protein(ENA) Antibody, IgG     <1.0 NEG AI <1.0 NEG  ENA SM Ab Ser-aCnc     <1.0 NEG AI <1.0 NEG  SSA (Ro) (ENA) Antibody, IgG     <1.0 NEG AI <1.0 NEG  SSB (La) (ENA) Antibody, IgG     <1.0 NEG AI <1.0 NEG  ds DNA Ab     IU/mL <1  RA Latex Turbid.     <14 IU/mL <14  14-3-3 eta Protein     <0.2 ng/mL <0.2     Assessment and Plan: Visit Diagnoses: Positive ANA (antinuclear antibody) - 06/14/18: ANA positive (no titer), smith-, CCP5, DNA histone 0.3, LA-, anticardiolipin ab-, LDH 168, TSH 1.793, Ace 15, vitamin B12 399, folate 14.6, ferritin 52, iron 102.  01/17/2019 complements within normal limits, repeat ANA negative, SCL 70 -, Ro negative, La negative, RNP negative, Smith negative, double-stranded DNA negative, RF negative, 14 3 3  eta negative: She has  no clinical features of autoimmune disease at this time.   Primary osteoarthritis of both hands:   Primary osteoarthritis of both feet:   Primary osteoarthritis of both knees -Moderate osteoarthritis and moderate chondromalacia patella.    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 Grandville Silos.  Other medical problems are listed as follows:  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preiser), left ([place])  Subclinical hypothyroidism  Acute gastric ulcer due to Helicobacter pylori  Reflux esophagitis  Pulmonary hypertension (HCC)  Unstable angina (HCC)  Aortic atherosclerosis (HCC)  Hypertension goal BP (blood pressure) < 140/90-blood pressure reading is very high today.  Have advised her to monitor blood pressure closely and follow-up with her PCP.  Coronary artery disease involving native coronary artery of native heart with angina pectoris (HCC)  Mixed hyperlipidemia  Prediabetes  Herpes simplex type 2 infection  Abuse, drug or alcohol (HCC)  Episodic paroxysmal anxiety disorder  ADHD (attention deficit hyperactivity disorder), inattentive type  Follow Up Instructions: She will follow up in    I discussed the assessment and treatment plan with the patient. The patient was provided an opportunity to ask questions and all were answered. The patient agreed with the plan and demonstrated an understanding of the instructions.   The patient was advised to call back or seek an in-person evaluation if the symptoms worsen or if the condition fails to improve as anticipated.  I provided *** minutes of non-face-to-face time during this encounter.   Ofilia Neas, PA-C

## 2019-02-10 ENCOUNTER — Other Ambulatory Visit: Payer: Self-pay

## 2019-02-10 ENCOUNTER — Encounter: Payer: Self-pay | Admitting: Family Medicine

## 2019-02-10 ENCOUNTER — Ambulatory Visit (INDEPENDENT_AMBULATORY_CARE_PROVIDER_SITE_OTHER): Payer: Medicare Other | Admitting: Family Medicine

## 2019-02-10 DIAGNOSIS — F410 Panic disorder [episodic paroxysmal anxiety] without agoraphobia: Secondary | ICD-10-CM

## 2019-02-10 DIAGNOSIS — R600 Localized edema: Secondary | ICD-10-CM | POA: Diagnosis not present

## 2019-02-10 DIAGNOSIS — I10 Essential (primary) hypertension: Secondary | ICD-10-CM | POA: Diagnosis not present

## 2019-02-10 DIAGNOSIS — L03119 Cellulitis of unspecified part of limb: Secondary | ICD-10-CM | POA: Diagnosis not present

## 2019-02-10 MED ORDER — VENLAFAXINE HCL ER 37.5 MG PO CP24: 37.5000 mg | ORAL_CAPSULE | Freq: Every day | ORAL | 1 refills | Status: DC

## 2019-02-10 NOTE — Progress Notes (Signed)
Name: Linda Mejia   MRN: [id number]    DOB: 1955/04/20   Date:02/10/2019       Progress Note  Subjective  Chief Complaint  Chief Complaint  Patient presents with  . Hospitalization Follow-up  . Depression    I connected with  Linda Mejia on 02/10/19 at  9:40 AM EDT by telephone and verified that I am speaking with the correct person using two identifiers.  I discussed the limitations, risks, security and privacy concerns of performing an evaluation and management service by telephone and the availability of in person appointments. Staff also discussed with the patient that there may be a patient responsible charge related to this service. Patient Location: Home Provider Location: Office Additional Individuals present: None  HPI  Linda Mejia was seen in the ER and subsequently admitted on 01/27/2019, discharged 01/29/2019 for BLE edema and cellulitis.  She presents today for discharge follow up.  Cellulitis: It was initially thought that she had cellulitis and was treated for such with antibiotics - IV ceftriaxone, then switched to oral antibiotics which she completed. It was suggested that the cellulitis was likely secondary to venous stasis dermatitis. She notes today that her BLE looks much better now - decreased swelling, redness has come down quite a bit.  She denies fevers/chills, - Rheumatology was consulted as she has history of positive ANA.  Linda Mejia saw the patient in the Mejia who recommended support stockings, gave B12 injection, and trial of gabapentin.  She is taking gabapentin, and reports that the bottom of her feet are still hurting, feels like there is no cushion.    Mood disorder: She would like to restart low dose effexor - she does see Linda Mejia Linda Mejia) and has upcoming appointment in a few months.  We will provide temporary fillof this to get her back on the medication as she is currently only taking valium PRN for her anxiety.  Denies SI/HI.   Hypertension: Linda Mejia took patient off of her amlodipine.  She is still taking metoprolol 50mg  BID.  Denies chest pain, shortness of breath, headaches, lightheadedness, or dizziness. She has not been able to check BP at home.   Labs Reviewed from her visit: Her CBC was WNL, BMP shows mild decrease in calcium; BG was a bit elevated (non-fasting), HIV negative, TSH normal, BNP negative, high sensitivity troponin negative, COVID testing is negative.  Patient Active Problem List   Diagnosis Date Noted  . Cellulitis 01/28/2019  . 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 Other specified abnormal immunological findings in serum 09/22/2018  . 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preiser), left ([place]) 09/22/2018  . Tenosynovitis of hand 09/22/2018  . Breast mass, right 06/14/2018  . Hypoxia 05/29/2018  . Acute peptic ulcer of stomach   . Stomach irritation   . Abdominal pain, epigastric   . Acute gastric ulcer due to Helicobacter pylori   . Pulmonary hypertension ([place]) 03/09/2018  . Unstable angina ([place]) 01/23/2018  . Medication monitoring encounter 03/18/2017  . Colon cancer screening 03/18/2017  . Coronary artery calcification seen on CT scan 12/02/2016  . Aortic atherosclerosis ([place]) 12/02/2016  . Abnormal ankle brachial index (ABI) 12/02/2016  . Prediabetes 12/02/2016  . Fracture of rib 08/28/2016  . Chest pain 08/11/2016  . Hx of tobacco use, presenting hazards to health 06/09/2016  . Degenerative arthritis of knee, bilateral 12/27/2015  . Obesity 12/27/2015  . GI bleed 09/21/2015  . Reflux esophagitis   .  Other diseases of stomach and duodenum   . Polyarthralgia 06/05/2015  . Night sweats 06/05/2015  . Headache 04/12/2015  . Subclinical hypothyroidism 03/26/2015  . Herpes simplex type 2 infection 03/21/2015  . Rhinitis, allergic 03/21/2015  . ADHD (attention deficit hyperactivity disorder), inattentive type 09/20/2014  . Episodic paroxysmal anxiety  disorder 09/20/2014  . History of gastric ulcer 09/20/2014  . Abuse, drug or alcohol ([place]) 09/20/2014  . HLD (hyperlipidemia) 10/03/2009  . Coronary artery disease involving native coronary artery of native heart with angina pectoris ([place]) 10/02/2009  . Hypertension goal BP (blood pressure) < 140/90 10/02/2009    Past Surgical History:  Procedure Laterality Date  . APPENDECTOMY    . CARDIAC CATHETERIZATION    . CESAREAN SECTION     x2  . COLON SURGERY     interception  . ESOPHAGOGASTRODUODENOSCOPY (EGD) WITH PROPOFOL N/A 09/21/2015   Procedure: ESOPHAGOGASTRODUODENOSCOPY (EGD) WITH PROPOFOL;  Surgeon: Lucilla Lame, MD;  Location: [facility] ENDOSCOPY;  Service: Endoscopy;  Laterality: N/A;  . ESOPHAGOGASTRODUODENOSCOPY (EGD) WITH PROPOFOL N/A 07/30/2016   Procedure: ESOPHAGOGASTRODUODENOSCOPY (EGD) WITH PROPOFOL;  Surgeon: Jonathon Bellows, MD;  Location: [facility] ENDOSCOPY;  Service: Endoscopy;  Laterality: N/A;  . ESOPHAGOGASTRODUODENOSCOPY (EGD) WITH PROPOFOL N/A 04/23/2018   Procedure: ESOPHAGOGASTRODUODENOSCOPY (EGD) WITH PROPOFOL;  Surgeon: Virgel Manifold, MD;  Location: [facility] ENDOSCOPY;  Service: Endoscopy;  Laterality: N/A;  . LEFT HEART CATH Right 01/25/2018   Procedure: Left Heart Cath and Coronary Angiography;  Surgeon: Dionisio David, MD;  Location: [place] [facility];  Service: Cardiovascular;  Laterality: Right;  . LEFT HEART CATH AND CORONARY ANGIOGRAPHY Right 08/12/2016   Procedure: Left Heart Cath and Coronary Angiography;  Surgeon: Dionisio David, MD;  Location: [place] [facility];  Service: Cardiovascular;  Laterality: Right;  . TONSILLECTOMY    . TUBAL LIGATION      Family History  Problem Relation Age of Onset  . Diabetes Mother   . CVA Mother   . Heart disease Father   . Heart disease Sister   . Breast cancer Sister   . Heart disease Brother   . Heart disease Sister   . Heart disease Sister   . Heart disease Sister   . Heart disease Brother   . Heart disease Brother    . Heart disease Brother   . Heart disease Brother   . Healthy Daughter   . Healthy Daughter     Social History   Socioeconomic History  . Marital status: Divorced    Spouse name: Not on file  . Number of children: 2  . Years of education: Not on file  . Highest education level: GED or equivalent  Occupational History  . Occupation: disabled  Social Needs  . Financial resource strain: Not hard at all  . Food insecurity    Worry: Never true    Inability: Never true  . Transportation needs    Medical: No    Non-medical: No  Tobacco Use  . Smoking status: Former Smoker    Packs/day: 1.00    Years: 30.00    Pack years: 30.00    Types: Cigarettes    Quit date: 2006    Years since quitting: 14.8  . Smokeless tobacco: Never Used  Substance and Sexual Activity  . Alcohol use: Yes    Frequency: Never  . Drug use: No  . Sexual activity: Not Currently    Birth control/protection: None, Post-menopausal  Lifestyle  . Physical activity    Days per week:  0 days    Minutes per session: 0 min  . Stress: Not at all  Relationships  . Social Herbalist on phone: Patient refused    Gets together: Patient refused    Attends religious service: Patient refused    Active member of club or organization: Patient refused    Attends meetings of clubs or organizations: Patient refused    Relationship status: Divorced  . Intimate partner violence    Fear of current or ex partner: No    Emotionally abused: No    Physically abused: No    Forced sexual activity: No  Other Topics Concern  . Not on file  Social History Narrative  . Not on file     Current Outpatient Medications:  .  acyclovir (ZOVIRAX) 400 MG tablet, take 1 tablet by mouth twice a day (Patient taking differently: Take 400 mg by mouth 2 (two) times daily. ), Disp: 60 tablet, Rfl: 5 .  Alirocumab (PRALUENT) 75 MG/ML SOAJ, Inject 1 each into the skin every 14 (fourteen) days., Disp: 6 pen, Rfl: 3 .   amphetamine-dextroamphetamine (ADDERALL) 30 MG tablet, Take 30 mg by mouth 2 (two) times daily. , Disp: , Rfl:  .  aspirin EC 81 MG tablet, Take 81 mg by mouth daily., Disp: , Rfl:  .  cholecalciferol (VITAMIN D) 1000 units tablet, Take 1,000 Units by mouth daily., Disp: , Rfl:  .  diazepam (VALIUM) 10 MG tablet, Take 1 tablet (10 mg total) by mouth every 8 (eight) hours as needed for anxiety., Disp: 30 tablet, Rfl: 0 .  fluticasone (FLONASE) 50 MCG/ACT nasal spray, Place 2 sprays into both nostrils daily as needed for allergies., Disp: 16 g, Rfl: 11 .  gabapentin (NEURONTIN) 100 MG capsule, Take 1 capsule (100 mg total) by mouth 3 (three) times daily., Disp: 90 capsule, Rfl: 0 .  metoprolol tartrate (LOPRESSOR) 50 MG tablet, TAKE 1 TABLET BY MOUTH TWICE A DAY (Patient taking differently: Take 50 mg by mouth 2 (two) times daily. ), Disp: 60 tablet, Rfl: 5 .  nitroGLYCERIN (NITROSTAT) 0.4 MG SL tablet, Place 1 tablet (0.4 mg total) under the tongue every 5 (five) minutes as needed for chest pain., Disp: 30 tablet, Rfl: 0 .  nystatin (MYCOSTATIN/NYSTOP) powder, Apply topically 2 (two) times daily. (Patient taking differently: Apply topically as needed (rash). ), Disp: 15 g, Rfl: 0 .  Omega-3 Fatty Acids (FISH OIL) 1000 MG CAPS, Take 1 capsule by mouth daily., Disp: , Rfl:   No Known Allergies  I personally reviewed active problem list, medication list, allergies, notes from last encounter, lab results with the patient/caregiver today.   ROS Constitutional: Negative for fever or weight change.  Respiratory: Negative for cough and shortness of breath.   Cardiovascular: Negative for chest pain or palpitations.  Gastrointestinal: Negative for abdominal pain, no bowel changes.  Musculoskeletal: Negative for gait problem or joint swelling.  Skin: Negative for rash.  Neurological: Negative for dizziness or headache.  No other specific complaints in a complete review of systems (except as listed in HPI  above).  Objective  Virtual encounter, vitals not obtained.  There is no height or weight on file to calculate BMI.  Physical Exam  Pulmonary/Chest: Effort normal. No respiratory distress. Speaking in complete sentences Neurological: Pt is alert and oriented to person, place, and time. Speech is normal. Psychiatric: Patient has a normal mood and affect. behavior is normal. Judgment and thought content normal.  No results found for  this or any previous visit (from the past 72 hour(s)).  PHQ2/9: Depression screen [facility] 2/9 02/10/2019 12/06/2018 06/17/2018 06/14/2018 06/07/2018  Decreased Interest 0 3 0 0 0  Down, Depressed, Hopeless 0 3 0 3 1  PHQ - 2 Score 0 6 0 3 1  Altered sleeping 0 3 0 3 -  Tired, decreased energy 0 3 0 3 -  Change in appetite 0 3 0 0 -  Feeling bad or failure about yourself  0 3 0 0 -  Trouble concentrating 0 1 0 0 -  Moving slowly or fidgety/restless 0 0 0 0 -  Suicidal thoughts 0 0 0 0 -  PHQ-9 Score 0 19 0 9 -  Difficult doing work/chores Not difficult at all Somewhat difficult Not difficult at all Very difficult -  Some recent data might be hidden   PHQ-2/9 Result is negative.    Fall Risk: Fall Risk  02/10/2019 12/16/2018 12/06/2018 06/17/2018 06/14/2018  Falls in the past year? 0 0 0 0 0  Number falls in past yr: 0 0 0 - -  Injury with Fall? 0 0 0 - -  Comment - - - - -  Risk for fall due to : - - - - -  Risk for fall due to: Comment - - - - -  Follow up Falls evaluation completed Falls prevention discussed Falls evaluation completed - -    Assessment & Mejia  1. Cellulitis of lower extremity, unspecified laterality - Likely secondary to venous stasis dermatitis; is planning to follow up with Linda Mejia, however I did advise to message our clinic if after seeing him she needs referral to vein and vascular and I will provide this.   2. Bilateral lower extremity edema - Much improved; recommend compression stockings  3. Episodic paroxysmal anxiety  disorder - Needs to follow up with psychiatry as scheduled; restart effexor. - venlafaxine XR (EFFEXOR XR) 37.5 MG 24 hr capsule; Take 1 capsule (37.5 mg total) by mouth daily with breakfast.  Dispense: 90 capsule; Refill: 1  4. Hypertension goal BP (blood pressure) < 140/90 - UTA BP today, though she is asymptomatic.   I discussed the assessment and treatment Mejia with the patient. The patient was provided an opportunity to ask questions and all were answered. The patient agreed with the Mejia and demonstrated an understanding of the instructions.   The patient was advised to call back or seek an in-person evaluation if the symptoms worsen or if the condition fails to improve as anticipated.  I provided 27 minutes of non-face-to-face time during this encounter.  Hubbard Hartshorn, FNP

## 2019-02-15 MED ORDER — ACYCLOVIR 400 MG PO TABS: 400.0000 mg | ORAL_TABLET | Freq: Two times a day (BID) | ORAL | 5 refills | Status: DC

## 2019-02-21 ENCOUNTER — Encounter: Payer: Self-pay | Admitting: Family Medicine

## 2019-02-26 NOTE — ED Provider Notes (Signed)
[facility] Emergency Department Provider Note ____________   First MD Initiated Contact with Patient 01/28/19 0522     (approximate)  I have reviewed the triage vital signs and the nursing notes.   HISTORY  Chief Complaint Shortness of Breath and Leg Swelling    HPI Linda Mejia is a 63 y.o. female with below list of previous medical conditions presents to the emergency department secondary to intermittent episodes of lower extremity pain and swelling times "few days".  Patient states that she has had cellulitis of the lower extremity with resultant sepsis February of this year and as such this is markedly concerning for her.  Patient denies any fever afebrile on presentation.       Past Medical History:  Diagnosis Date  . Abnormal ankle brachial index (ABI) 12/02/2016  . ADHD (attention deficit hyperactivity disorder)   . Anxiety   . Aortic atherosclerosis ([place]) 12/02/2016   July 2018  . Chronic back pain   . Coronary artery disease   . Herpes genitalis in women   . History of stomach ulcers    x7 this year. Bleeding  . Hypertension   . Prediabetes 12/02/2016  . Pulmonary hypertension ([place]) 03/09/2018   Echo Oct 2019    Patient Active Problem List   Diagnosis Date Noted  . Cellulitis 01/28/2019  . 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 Other specified abnormal immunological findings in serum 09/22/2018  . 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preiser), left ([place]) 09/22/2018  . Tenosynovitis of hand 09/22/2018  . Breast mass, right 06/14/2018  . Hypoxia 05/29/2018  . Acute peptic ulcer of stomach   . Stomach irritation   . Abdominal pain, epigastric   . Acute gastric ulcer due to Helicobacter pylori   . Pulmonary hypertension ([place]) 03/09/2018  . Unstable angina ([place]) 01/23/2018  . Medication monitoring encounter 03/18/2017  . Colon cancer screening 03/18/2017  . Coronary artery calcification seen on CT scan 12/02/2016   . Aortic atherosclerosis ([place]) 12/02/2016  . Abnormal ankle brachial index (ABI) 12/02/2016  . Prediabetes 12/02/2016  . Fracture of rib 08/28/2016  . Chest pain 08/11/2016  . Hx of tobacco use, presenting hazards to health 06/09/2016  . Degenerative arthritis of knee, bilateral 12/27/2015  . Obesity 12/27/2015  . GI bleed 09/21/2015  . Reflux esophagitis   . Other diseases of stomach and duodenum   . Polyarthralgia 06/05/2015  . Night sweats 06/05/2015  . Headache 04/12/2015  . Subclinical hypothyroidism 03/26/2015  . Herpes simplex type 2 infection 03/21/2015  . Rhinitis, allergic 03/21/2015  . ADHD (attention deficit hyperactivity disorder), inattentive type 09/20/2014  . Episodic paroxysmal anxiety disorder 09/20/2014  . History of gastric ulcer 09/20/2014  . Abuse, drug or alcohol ([place]) 09/20/2014  . HLD (hyperlipidemia) 10/03/2009  . Coronary artery disease involving native coronary artery of native heart with angina pectoris ([place]) 10/02/2009  . Hypertension goal BP (blood pressure) < 140/90 10/02/2009    Past Surgical History:  Procedure Laterality Date  . APPENDECTOMY    . CARDIAC CATHETERIZATION    . CESAREAN SECTION     x2  . COLON SURGERY     interception  . ESOPHAGOGASTRODUODENOSCOPY (EGD) WITH PROPOFOL N/A 09/21/2015   Procedure: ESOPHAGOGASTRODUODENOSCOPY (EGD) WITH PROPOFOL;  Surgeon: Lucilla Lame, MD;  Location: [facility] ENDOSCOPY;  Service: Endoscopy;  Laterality: N/A;  . ESOPHAGOGASTRODUODENOSCOPY (EGD) WITH PROPOFOL N/A 07/30/2016   Procedure: ESOPHAGOGASTRODUODENOSCOPY (EGD) WITH PROPOFOL;  Surgeon: Jonathon Bellows, MD;  Location:  [place] [facility]COPY;  Service: Endoscopy;  Laterality: N/A;  . ESOPHAGOGASTRODUODENOSCOPY (EGD) WITH PROPOFOL N/A 04/23/2018   Procedure: ESOPHAGOGASTRODUODENOSCOPY (EGD) WITH PROPOFOL;  Surgeon: Virgel Manifold, MD;  Location: [facility] ENDOSCOPY;  Service: Endoscopy;  Laterality: N/A;  . LEFT HEART CATH Right 01/25/2018   Procedure: Left Heart  Cath and Coronary Angiography;  Surgeon: Dionisio David, MD;  Location: [place] CV [facility];  Service: Cardiovascular;  Laterality: Right;  . LEFT HEART CATH AND CORONARY ANGIOGRAPHY Right 08/12/2016   Procedure: Left Heart Cath and Coronary Angiography;  Surgeon: Dionisio David, MD;  Location: [place] [facility];  Service: Cardiovascular;  Laterality: Right;  . TONSILLECTOMY    . TUBAL LIGATION      Prior to Admission medications   Medication Sig Start Date End Date Taking? Authorizing Provider  Alirocumab (PRALUENT) 75 MG/ML SOAJ Inject 1 each into the skin every 14 (fourteen) days. 12/09/18  Yes Hubbard Hartshorn, FNP  amphetamine-dextroamphetamine (ADDERALL) 30 MG tablet Take 30 mg by mouth 2 (two) times daily.  12/02/16  Yes Lada, Satira Anis, MD  aspirin EC 81 MG tablet Take 81 mg by mouth daily.   Yes [provider]  cholecalciferol (VITAMIN D) 1000 units tablet Take 1,000 Units by mouth daily.   Yes [provider]  diazepam (VALIUM) 10 MG tablet Take 1 tablet (10 mg total) by mouth every 8 (eight) hours as needed for anxiety. 09/22/15  Yes Fritzi Mandes, MD  metoprolol tartrate (LOPRESSOR) 50 MG tablet TAKE 1 TABLET BY MOUTH TWICE A DAY Patient taking differently: Take 50 mg by mouth 2 (two) times daily.  12/21/18  Yes Hubbard Hartshorn, FNP  nitroGLYCERIN (NITROSTAT) 0.4 MG SL tablet Place 1 tablet (0.4 mg total) under the tongue every 5 (five) minutes as needed for chest pain. 01/26/18  Yes Mody, Ulice Bold, MD  nystatin (MYCOSTATIN/NYSTOP) powder Apply topically 2 (two) times daily. Patient taking differently: Apply topically as needed (rash).  05/25/18  Yes Dustin Flock, MD  Omega-3 Fatty Acids (FISH OIL) 1000 MG CAPS Take 1 capsule by mouth daily.   Yes [provider]  acyclovir (ZOVIRAX) 400 MG tablet Take 1 tablet (400 mg total) by mouth 2 (two) times daily. 02/15/19   Hubbard Hartshorn, FNP  fluticasone (FLONASE) 50 MCG/ACT nasal spray Place 2 sprays into both nostrils  daily as needed for allergies. 02/08/19   Hubbard Hartshorn, FNP  gabapentin (NEURONTIN) 100 MG capsule Take 1 capsule (100 mg total) by mouth 3 (three) times daily. 01/29/19   Dustin Flock, MD  venlafaxine XR (EFFEXOR XR) 37.5 MG 24 hr capsule Take 1 capsule (37.5 mg total) by mouth daily with breakfast. 02/10/19   Hubbard Hartshorn, FNP    Allergies Patient has no known allergies.  Family History  Problem Relation Age of Onset  . Diabetes Mother   . CVA Mother   . Heart disease Father   . Heart disease Sister   . Breast cancer Sister   . Heart disease Brother   . Heart disease Sister   . Heart disease Sister   . Heart disease Sister   . Heart disease Brother   . Heart disease Brother   . Heart disease Brother   . Heart disease Brother   . Healthy Daughter   . Healthy Daughter     Social History Social History   Tobacco Use  . Smoking status: Former Smoker    Packs/day: 1.00    Years: 30.00  Pack years: 30.00    Types: Cigarettes    Quit date: 2006    Years since quitting: 14.8  . Smokeless tobacco: Never Used  Substance Use Topics  . Alcohol use: Yes    Frequency: Never  . Drug use: No    Review of Systems Constitutional: No fever/chills Eyes: No visual changes. ENT: No sore throat. Cardiovascular: Denies chest pain. Respiratory: Denies shortness of breath. Gastrointestinal: No abdominal pain.  No nausea, no vomiting.  No diarrhea.  No constipation. Genitourinary: Negative for dysuria. Musculoskeletal: Negative for neck pain.  Negative for back pain.  Positive for lower extremity pain and swelling. Integumentary: Negative for rash. Neurological: Negative for headaches, focal weakness or numbness.   ____________________________________________   PHYSICAL EXAM:  VITAL SIGNS: ED Triage Vitals  Enc Vitals Group     BP 01/27/19 2124 (!) 142/93     Pulse Rate 01/27/19 2124 87     Resp 01/27/19 2124 16     Temp 01/27/19 2124 98.3 F (36.8 C)     Temp  Source 01/27/19 2124 Oral     SpO2 01/27/19 2124 93 %     Weight 01/27/19 2125 95.3 kg (210 lb)     Height 01/27/19 2125 1.651 m (5\' 5" )     Head Circumference --      Peak Flow --      Pain Score 01/27/19 2125 4     Pain Loc --      Pain Edu? --      Excl. in [place]? --     Constitutional: Alert and oriented.  Eyes: Conjunctivae are normal.  Mouth/Throat: Patient is wearing a mask. Neck: No stridor.  No meningeal signs.   Cardiovascular: Normal rate, regular rhythm. Good peripheral circulation. Grossly normal heart sounds. Respiratory: Normal respiratory effort.  No retractions. Gastrointestinal: Soft and nontender. No distention.  Musculoskeletal: Mild lower extremity nonpitting edema mild lower extremity erythema. Neurologic:  Normal speech and language. No gross focal neurologic deficits are appreciated.  Skin:  Skin is warm, dry and intact. Psychiatric: Anxious affect.Marland Kitchen Speech and behavior are normal.  ____________________________________________   LABS (all labs ordered are listed, but only abnormal results are displayed)  Labs Reviewed  BASIC METABOLIC PANEL - Abnormal; Notable for the following components:      Result Value   Glucose, Bld 109 (*)    Calcium 8.7 (*)    All other components within normal limits  BASIC METABOLIC PANEL - Abnormal; Notable for the following components:   Glucose, Bld 116 (*)    Calcium 8.4 (*)    Anion gap 4 (*)    All other components within normal limits  SARS CORONAVIRUS 2 (TAT 6-24 HRS)  CBC  BRAIN NATRIURETIC PEPTIDE  HIV ANTIBODY (ROUTINE TESTING W REFLEX)  TSH  CBC  HIV4GL SAVE TUBE  TROPONIN I (HIGH SENSITIVITY)   ____________________________________________  EKG  ED ECG REPORT I, Devine N BROWN, the attending physician, personally viewed and interpreted this ECG.   Date: 01/27/2019  EKG Time: 9:32 PM  Rate: 82  Rhythm: Normal sinus rhythm  Axis: Normal  Intervals: Normal  ST&T Change: None    Procedures    ____________________________________________   INITIAL IMPRESSION / MDM / ASSESSMENT AND PLAN / ED COURSE  As part of my medical decision making, I reviewed the following data within the electronic MEDICAL RECORD NUMBER  63 year old female presented with above-stated history and physical exam concerning for lower extremity cellulitis.  Patient received IV ceftriaxone 1  g.  Considered outpatient treatment for the patient however patient markedly anxious regarding the possibility of sepsis as she had cellulitis in February that resulted in sepsis.  Patient discussed with hospitalist for hospital admission for observation.  ____________________________________________  FINAL CLINICAL IMPRESSION(S) / ED DIAGNOSES  Lower extremity cellulitis.  MEDICATIONS GIVEN DURING THIS VISIT:  Medications  iohexol (OMNIPAQUE) 350 MG/ML injection 75 mL (75 mLs Intravenous Contrast Given 01/28/19 0107)  cefTRIAXone (ROCEPHIN) 1 g in sodium chloride 0.9 % 100 mL IVPB (0 g Intravenous Stopped 01/28/19 0527)  cyanocobalamin ((VITAMIN B-12)) injection 1,000 mcg (1,000 mcg Intramuscular Given 01/28/19 1547)     ED Discharge Orders         Ordered    gabapentin (NEURONTIN) 100 MG capsule  3 times daily     01/29/19 1011    Increase activity slowly     01/29/19 1011    Diet - low sodium heart healthy     01/29/19 1011          *Please note:  Linda Mejia was evaluated in Emergency Department on 02/26/2019 for the symptoms described in the history of present illness. She was evaluated in the context of the global COVID-19 pandemic, which necessitated consideration that the patient might be at risk for infection with the SARS-CoV-2 virus that causes COVID-19. Institutional protocols and algorithms that pertain to the evaluation of patients at risk for COVID-19 are in a state of rapid change based on information released by regulatory bodies including the CDC and federal and state organizations. These policies and  algorithms were followed during the patient's care in the ED.  Some ED evaluations and interventions may be delayed as a result of limited staffing during the pandemic.*  Note:  This document was prepared using Dragon voice recognition software and may include unintentional dictation errors.   Gregor Hams, MD 02/26/19 [phone]

## 2019-03-07 ENCOUNTER — Telehealth: Payer: Self-pay

## 2019-03-07 ENCOUNTER — Ambulatory Visit: Payer: Self-pay

## 2019-03-07 ENCOUNTER — Other Ambulatory Visit: Payer: Self-pay | Admitting: Family Medicine

## 2019-03-07 DIAGNOSIS — F410 Panic disorder [episodic paroxysmal anxiety] without agoraphobia: Secondary | ICD-10-CM

## 2019-03-07 DIAGNOSIS — R61 Generalized hyperhidrosis: Secondary | ICD-10-CM

## 2019-03-07 NOTE — Chronic Care Management (AMB) (Signed)
  Chronic Care Management   Outreach Note  03/07/2019 Name: Linda Mejia MRN: [id number] DOB: 1955/04/29  Primary Care Provider: Hubbard Hartshorn, FNP Reason for referral : Chronic Care Management   An unsuccessful telephone outreach was attempted today. [name] was referred to the case management team for assistance with care management and care coordination.   Follow Up Plan: Phone line busy after multiple attempts. The care management team will reach out to [name] again over the next two to three weeks.    [facility] Care Management [phone]

## 2019-03-07 NOTE — Telephone Encounter (Signed)
Requested medication (s) are due for refill today: no  Requested medication (s) are on the active medication list: no  Last refill:  01/28/2019  Future visit scheduled: yes  Notes to clinic:  Medication was discontinued    Requested Prescriptions  Pending Prescriptions Disp Refills   venlafaxine XR (EFFEXOR-XR) 75 MG 24 hr capsule [Pharmacy Med Name: VENLAFAXINE ER 75MG  CAPSULES] 90 capsule 0    Sig: TAKE 1 CAPSULE BY MOUTH DAILY WITH BREAKFAST...START THIS AFTER COMPLETING 37.5MG      Psychiatry: Antidepressants - SNRI - desvenlafaxine & venlafaxine Failed - 03/07/2019 11:01 AM      Failed - LDL in normal range and within 360 days    LDL Cholesterol (Calc)  Date Value Ref Range Status  12/06/2018 229 (H) mg/dL (calc) Final    Comment:    LDL-C levels > or = 190 mg/dL may indicate familial  hypercholesterolemia (FH). Clinical assessment and  measurement of blood lipid levels should be  considered for all first degree relatives of  patients with an FH diagnosis.  For questions about testing for familial hypercholesterolemia, please call Insurance risk surveyor at [email or web address]. Duncan Dull, et al. J National Lipid Association  Recommendations for Patient-Centered Management of  Dyslipidemia: Part 1 Journal of Clinical Lipidology  2015;9(2), 129-169. Reference range: <100 . Desirable range <100 mg/dL for primary prevention;   <70 mg/dL for patients with CHD or diabetic patients  with > or = 2 CHD risk factors. Marland Kitchen LDL-C is now calculated using the Martin-Hopkins  calculation, which is a validated novel method providing  better accuracy than the Friedewald equation in the  estimation of LDL-C.  Cresenciano Genre et al. Annamaria Helling. [id number]): 2061-2068  (http://education.QuestDiagnostics.com/faq/FAQ164)          Failed - Total Cholesterol in normal range and within 360 days    Cholesterol, Total  Date Value Ref Range Status  03/22/2015 264 (H) 100 - 199 mg/dL Final    Cholesterol  Date Value Ref Range Status  12/06/2018 332 (H) <200 mg/dL Final         Failed - Triglycerides in normal range and within 360 days    Triglycerides  Date Value Ref Range Status  12/06/2018 250 (H) <150 mg/dL Final    Comment:    . If a non-fasting specimen was collected, consider repeat triglyceride testing on a fasting specimen if clinically indicated.  Yates Decamp et al. J. of Clin. Lipidol. [id number]. .          Failed - Completed PHQ-2 or PHQ-9 in the last 360 days.      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

## 2019-03-07 NOTE — Telephone Encounter (Signed)
This is not a patient at [facility]. Looks like she goes to [facility].

## 2019-03-09 NOTE — Telephone Encounter (Signed)
Please let Ms. Nicholl know that I already sent in a 90 day supply of the low-dose effexor we discussed at her last visit - I strongly recommend she obtain any additional refills from Dr. Collie Siad who is managing her other psychiatric medications and who should also manage this refill going forward. Thanks!

## 2019-03-15 NOTE — Progress Notes (Signed)
This encounter was created in error - please disregard.

## 2019-03-16 ENCOUNTER — Ambulatory Visit: Payer: Self-pay

## 2019-03-16 ENCOUNTER — Telehealth: Payer: Self-pay

## 2019-03-16 NOTE — Chronic Care Management (AMB) (Signed)
  Chronic Care Management   Outreach Note  03/16/2019 Name: Linda Mejia MRN: [id number] DOB: 07-14-1955  Referred by: Hubbard Hartshorn, FNP Reason for referral : Chronic Care Management   A second unsuccessful telephone outreach was attempted today. [name] was referred to the case management team for assistance with care management and care coordination. Unable to leave a voice message due to phone line being busy.  Follow Up Plan: -Multiple attempts to contact [name]. Unfortunately the phone line continues to ring busy. Will mail a letter along with CCM Case Manager contact information to [name] today.  -The care management team will reach out again within the next two weeks.   [facility] Care Management [phone]

## 2019-03-28 ENCOUNTER — Ambulatory Visit: Payer: Self-pay

## 2019-03-28 NOTE — Chronic Care Management (AMB) (Signed)
  Chronic Care Management   Note  03/28/2019 Name: Linda Mejia MRN: [id number] DOB: November 09, 1955    Call received from Ms. Crawshaw in response to the unsuccessful outreach letter. HIPAA identifiers verified.  She initially declined care management services when contacted by the [facility] on 02/07/19. Since the initial outreach, she has expressed interest in the services and is agreeable to ongoing outreach. Confirmed availability for an intake assessment on 04/13/19. She was encouraged to contact the team with questions or concerns if needed prior to the scheduled outreach.  Follow up plan: -Will complete intake assessment on 04/13/19.   [facility] Care Management [phone]

## 2019-03-30 ENCOUNTER — Telehealth: Payer: Self-pay

## 2019-04-11 ENCOUNTER — Ambulatory Visit: Payer: Medicare Other | Admitting: Family Medicine

## 2019-04-13 ENCOUNTER — Ambulatory Visit: Payer: Self-pay

## 2019-04-13 ENCOUNTER — Telehealth: Payer: Self-pay

## 2019-04-13 NOTE — Chronic Care Management (AMB) (Signed)
  Chronic Care Management   Outreach Note  04/13/2019 Name: Linda Mejia MRN: [id number] DOB: Jan 29, 1956  Primary Care Provider: Hubbard Hartshorn, FNP Reason for referral : Chronic Care Management  An unsuccessful telephone outreach was attempted today. [name] was referred to the case management team for assistance with care management and care coordination.    Follow Up Plan: The care management team will reach out to [name] again within the next two to three weeks.    [facility] Care Management [phone]

## 2019-04-21 ENCOUNTER — Other Ambulatory Visit: Payer: Self-pay | Admitting: Family Medicine

## 2019-04-22 NOTE — Telephone Encounter (Signed)
Requested medication (s) are due for refill today: no  Requested medication (s) are on the active medication list: no  Last refill:  01/08/2019  Future visit scheduled: no  Notes to clinic:  d/c'd by Dr Patel10/17/2020   Requested Prescriptions  Pending Prescriptions Disp Refills   amLODipine (NORVASC) 5 MG tablet [Pharmacy Med Name: AMLODIPINE BESYLATE 5MG  TABLETS] 30 tablet 5    Sig: TAKE 1 TABLET(5 MG) BY MOUTH DAILY FOR BLOOD PRESSURE      Cardiovascular:  Calcium Channel Blockers Passed - 04/21/2019  6:43 PM      Passed - Last BP in normal range    BP Readings from Last 1 Encounters:  01/29/19 123/85          Passed - Valid encounter within last 6 months    Recent Outpatient Visits           2 months ago Cellulitis of lower extremity, unspecified laterality   [place], [place], FNP   4 months ago 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

## 2019-04-29 ENCOUNTER — Telehealth: Payer: Self-pay

## 2019-05-13 ENCOUNTER — Ambulatory Visit: Payer: Self-pay

## 2019-05-13 ENCOUNTER — Telehealth: Payer: Self-pay

## 2019-05-13 NOTE — Chronic Care Management (AMB) (Signed)
  Chronic Care Management   Outreach Note  05/13/2019 Name: Linda Mejia MRN: [id number] DOB: [birth year]  Primary Care Provider: Hubbard Hartshorn, FNP Reason for referral : Chronic Care Management   An unsuccessful telephone outreach was attempted today. [name] was referred to the case management team for assistance with care management and care coordination.     Follow Up Plan: Phone rang multiple times without an option to leave a voice message. The care management team will reach out to [name] again within the next two weeks.     [facility] Care Management [phone]

## 2019-05-27 ENCOUNTER — Ambulatory Visit: Payer: Self-pay

## 2019-05-27 DIAGNOSIS — I25119 Atherosclerotic heart disease of native coronary artery with unspecified angina pectoris: Secondary | ICD-10-CM

## 2019-05-27 DIAGNOSIS — E782 Mixed hyperlipidemia: Secondary | ICD-10-CM

## 2019-05-27 DIAGNOSIS — I10 Essential (primary) hypertension: Secondary | ICD-10-CM

## 2019-05-27 NOTE — Patient Instructions (Addendum)
Thank you for allowing the Chronic Care Management team to participate in your care.  Goals Addressed            This Visit's Progress   . Improve Self-Management of Chronic Illnesses       Current Barriers:  . Chronic Disease Management support and education needs related to Hypertension, CAD and Hyperlipidemia.  Case Manager Clinical Goal(s):  Linda Kitchen Over the next 90 days, patient will attend all scheduled medical appointments as scheduled. . Over the next 90 days, patient will take all medications as prescribed. . Over the next 90 days, patient will monitor her blood pressure and record readings. . Over the next 90 days, patient will continue compliance with cardiac prudent/heart healthy diet. . Over the next 90 days, patient will engage in mild exercises and walking as tolerated. . Over the next 90 days, patient will follow recommended home safety measures to prevent falls.  Interventions:  . Reviewed medications and indications for use. Patient encouraged to continue taking all medications as prescribed. Patient denied concerns regarding medication management or prescription costs. . Provided education regarding hypertension and blood pressure parameters. Patient encouraged to monitor her blood pressure daily and record readings. Encouraged to notify PCP with readings outside of established parameters. Patient reports not monitoring her BP at home. Reports readings during clinic visits have been within range. She prefers to have her PCP prescribe a BP monitor if her insurance provider will cover the cost. Will follow-up to discuss. . Advised patient to continue compliance with cardiac prudent/heart health diet. Provided additional education regarding nutrition and healthy snack options.  . Patient encouraged to engage in low impact, non strenuous exercises as tolerated. . Provided education regarding home safety and fall prevention. Patient encouraged to use caution when ambulating due to  history of joint pain. Encouraged to be especially mindful of potential for trips/falls when walking her dog. . Reviewed recent and upcoming provider appointments.  Encouraged to attend all medical appointments to prevent delays in care. Patient reports cancelling her scheduled PCP visit on 04/11/19. She prefers to reschedule a virtual visit. Anticipates scheduling appointment within the next two weeks.  Patient Self Care Activities:  . Self administers medications . Calls pharmacy for medication refills . Performs ADL's independently . Performs IADL's independently . Calls provider office for new concerns or questions   Initial goal documentation       Linda Mejia was given information about Chronic Care Management services including:  1. CCM service includes personalized support from designated clinical staff supervised by her physician, including individualized plan of care and coordination with other care providers 2. 24/7 contact phone numbers for assistance for urgent and routine care needs. 3. Service will only be billed when office clinical staff spend 20 minutes or more in a month to coordinate care. 4. Only one practitioner may furnish and bill the service in a calendar month. 5. The patient may stop CCM services at any time (effective at the end of the month) by phone call to the office staff. 6. The patient will be responsible for cost sharing (co-pay) of up to 20% of the service fee (after annual deductible is met).   Linda Mejia agreed to services. Verbal consent obtained.       Linda Mejia verbalized understanding of instructions provided during the telephonic outreach today. She declined need for a printed/mailed copy of the instructions.   The care management team will follow up with Linda Mejia within the next two weeks.  [facility] [phone]

## 2019-05-27 NOTE — Chronic Care Management (AMB) (Signed)
Chronic Care Management   Initial Visit Note  05/27/2019 Name: Linda Mejia MRN: [id number] DOB: 02/12/1956  Primary Care Provider: Boyce, Emily E, FNP Reason for referral : Chronic Care Management   Linda Mejia is a 64 y.o. year old female who is a primary care patient of Boyce, Emily E, FNP. The CCM team was consulted for assistance with chronic disease management and care coordination.   Review of Linda Mejia's status, including review of consultants reports, relevant labs and test results was conducted today. Collaboration with appropriate care team members was performed as part of the comprehensive evaluation and provision of chronic care management services.     SDOH (Social Determinants of Health) screening performed today.  Medications: Outpatient Encounter Medications as of 05/27/2019  Medication Sig  . acyclovir (ZOVIRAX) 400 MG tablet Take 1 tablet (400 mg total) by mouth 2 (two) times daily.  . Alirocumab (PRALUENT) 75 MG/ML SOAJ Inject 1 each into the skin every 14 (fourteen) days.  . amLODipine (NORVASC) 5 MG tablet TAKE 1 TABLET(5 MG) BY MOUTH DAILY FOR BLOOD PRESSURE  . amphetamine-dextroamphetamine (ADDERALL) 30 MG tablet Take 30 mg by mouth 2 (two) times daily.   . aspirin EC 81 MG tablet Take 81 mg by mouth daily.  . cholecalciferol (VITAMIN D) 1000 units tablet Take 1,000 Units by mouth daily.  . diazepam (VALIUM) 10 MG tablet Take 1 tablet (10 mg total) by mouth every 8 (eight) hours as needed for anxiety.  . fluticasone (FLONASE) 50 MCG/ACT nasal spray Place 2 sprays into both nostrils daily as needed for allergies.  . gabapentin (NEURONTIN) 100 MG capsule Take 1 capsule (100 mg total) by mouth 3 (three) times daily.  . metoprolol tartrate (LOPRESSOR) 50 MG tablet TAKE 1 TABLET BY MOUTH TWICE A DAY (Patient taking differently: Take 50 mg by mouth 2 (two) times daily. )  . nitroGLYCERIN (NITROSTAT) 0.4 MG SL tablet Place 1 tablet (0.4 mg total) under the tongue every 5  (five) minutes as needed for chest pain.  . nystatin (MYCOSTATIN/NYSTOP) powder Apply topically 2 (two) times daily. (Patient taking differently: Apply topically as needed (rash). )  . Omega-3 Fatty Acids (FISH OIL) 1000 MG CAPS Take 1 capsule by mouth daily.  . venlafaxine XR (EFFEXOR XR) 37.5 MG 24 hr capsule Take 1 capsule (37.5 mg total) by mouth daily with breakfast.   No facility-administered encounter medications on file as of 05/27/2019.     Objective:   BP Readings from Last 3 Encounters:  01/29/19 123/85  01/17/19 (!) 165/101  12/06/18 134/78   Wt Readings from Last 3 Encounters:  01/29/19 213 lb 9.6 oz (96.9 kg)  01/17/19 216 lb (98 kg)  12/16/18 211 lb 3.2 oz (95.8 kg)    Lab Results  Component Value Date   HGBA1C 6.1 (H) 12/06/2018     Lab Results  Component Value Date   CHOL 332 (H) 12/06/2018   CHOL 194 05/29/2018   CHOL 190 03/09/2018   Lab Results  Component Value Date   HDL 58 12/06/2018   HDL 34 (L) 05/29/2018   HDL 54 03/09/2018   Lab Results  Component Value Date   LDLCALC 229 (H) 12/06/2018   LDLCALC 119 (H) 05/29/2018   LDLCALC 107 (H) 03/09/2018   Lab Results  Component Value Date   TRIG 250 (H) 12/06/2018   TRIG 203 (H) 05/29/2018   TRIG 172 (H) 03/09/2018   Lab Results  Component Value Date   CHOLHDL 5.7 (H)   12/06/2018   CHOLHDL 5.7 05/29/2018   CHOLHDL 3.5 03/09/2018   Fall Risk  05/27/2019 02/10/2019 12/16/2018 12/06/2018 06/17/2018  Falls in the past year? 0 0 0 0 0  Number falls in past yr: - 0 0 0 -  Injury with Fall? - 0 0 0 -  Comment - - - - -  Risk for fall due to : Medication side effect;Other (Comment) - - - -  Risk for fall due to: Comment History of Joint Pain - - - -  Follow up Falls prevention discussed Falls evaluation completed Falls prevention discussed Falls evaluation completed -    Depression screen PHQ 2/9 05/27/2019 02/10/2019 12/06/2018  Decreased Interest 0 0 3  Down, Depressed, Hopeless 0 0 3  PHQ - 2  Score 0 0 6  Altered sleeping - 0 3  Tired, decreased energy - 0 3  Change in appetite - 0 3  Feeling bad or failure about yourself  - 0 3  Trouble concentrating - 0 1  Moving slowly or fidgety/restless - 0 0  Suicidal thoughts - 0 0  PHQ-9 Score - 0 19  Difficult doing work/chores - Not difficult at all Somewhat difficult  Some recent data might be hidden    Goals Addressed            This Visit's Progress   . Improve Self-Management of Chronic Illnesses       Current Barriers:  . Chronic Disease Management support and education needs related to Hypertension, CAD and Hyperlipidemia.  Case Manager Clinical Goal(s):  . Over the next 90 days, patient will attend all scheduled medical appointments as scheduled. . Over the next 90 days, patient will take all medications as prescribed. . Over the next 90 days, patient will monitor her blood pressure and record readings. . Over the next 90 days, patient will continue compliance with cardiac prudent/heart healthy diet. . Over the next 90 days, patient will engage in mild exercises and walking as tolerated. . Over the next 90 days, patient will follow recommended home safety measures to prevent falls.  Interventions:  . Reviewed medications and indications for use. Patient encouraged to continue taking all medications as prescribed. Patient denied concerns regarding medication management or prescription costs. . Provided education regarding hypertension and blood pressure parameters. Patient encouraged to monitor her blood pressure daily and record readings. Encouraged to notify PCP with readings outside of established parameters. Patient reports not monitoring her BP at home. Reports readings during clinic visits have been within range. She prefers to have her PCP prescribe a BP monitor if her insurance provider will cover the cost. Will follow-up to discuss. . Advised patient to continue compliance with cardiac prudent/heart health diet.  Provided additional education regarding nutrition and healthy snack options.  . Patient encouraged to engage in low impact, non strenuous exercises as tolerated. . Provided education regarding home safety and fall prevention. Patient encouraged to use caution when ambulating due to history of joint pain. Encouraged to be especially mindful of potential for trips/falls when walking her dog. . Reviewed recent and upcoming provider appointments.  Encouraged to attend all medical appointments to prevent delays in care. Patient reports cancelling her scheduled PCP visit on 04/11/19. She prefers to reschedule a virtual visit. Anticipates scheduling appointment within the next two weeks.  Patient Self Care Activities:  . Self administers medications . Calls pharmacy for medication refills . Performs ADL's independently . Performs IADL's independently . Calls provider office for new concerns or   questions   Initial goal documentation        Linda Mejia was given information about Chronic Care Management services including:  1. CCM service includes personalized support from designated clinical staff supervised by her physician, including individualized plan of care and coordination with other care providers 2. 24/7 contact phone numbers for assistance for urgent and routine care needs. 3. Service will only be billed when office clinical staff spend 20 minutes or more in a month to coordinate care. 4. Only one practitioner may furnish and bill the service in a calendar month. 5. The patient may stop CCM services at any time (effective at the end of the month) by phone call to the office staff. 6. The patient will be responsible for cost sharing (co-pay) of up to 20% of the service fee (after annual deductible is met).  Patient agreed to services and verbal consent obtained.     PLAN The care management team will follow up with Linda Mejia within the next two weeks.    Felecia McCray,RN [facility] Care [facility] [phone]    

## 2019-06-01 ENCOUNTER — Telehealth: Payer: Self-pay

## 2019-06-01 ENCOUNTER — Ambulatory Visit: Payer: Self-pay

## 2019-06-01 NOTE — Chronic Care Management (AMB) (Signed)
  Chronic Care Management   Note  06/01/2019 Name: Linda Mejia MRN: [id number] DOB: [birth year]    Follow up outreach with Ms. Heiser regarding appointment options with her primary care provider. Ms. Brohl reports cancelling her scheduled visit on 04/11/19 due to COVID-19 concerns.  Ms. Meader was offered a virtual outreach within the next two weeks if she prefers not to visit the clinic. Encouraged to call or contact clinic staff if further assistance is needed.   Follow up plan: -Will follow up within the next two to three weeks.   [facility] [phone]

## 2019-06-03 ENCOUNTER — Other Ambulatory Visit: Payer: Self-pay

## 2019-06-03 ENCOUNTER — Ambulatory Visit: Payer: Self-pay

## 2019-06-03 NOTE — Chronic Care Management (AMB) (Signed)
  Chronic Care Management   Note  06/03/2019 Name: Linda Mejia MRN: [id number] DOB: 1956/02/27   Attempted follow-up outreach with [name] to discuss blood pressure monitoring. She reports doing well but unavailable to discuss updates at the time of the call. Agreed to return call when she is available.    Follow up plan: Anticipate follow-up within the next two to three weeks.    [facility] [phone]

## 2019-06-04 ENCOUNTER — Encounter: Payer: Self-pay | Admitting: Family Medicine

## 2019-06-22 ENCOUNTER — Telehealth: Payer: Self-pay

## 2019-06-23 ENCOUNTER — Ambulatory Visit: Payer: Self-pay

## 2019-06-23 NOTE — Chronic Care Management (AMB) (Signed)
  Chronic Care Management   Note  06/23/2019 Name: Linda Mejia MRN: [id number] DOB: [birth year]   Call received from Ms. Senne regarding Covid-19 vaccination options and sites. Reviewed current health status and preference for vaccination locations. Discussed common side effects associated with the vaccination.   Follow up plan: Will follow-up within the next two weeks to assist with scheduling the initial dose.   [facility] Care [facility] [phone]

## 2019-06-28 ENCOUNTER — Telehealth: Payer: Medicare Other | Admitting: Family Medicine

## 2019-07-04 ENCOUNTER — Ambulatory Visit: Payer: Self-pay

## 2019-07-04 NOTE — Chronic Care Management (AMB) (Signed)
This encounter was created in error. Please disregard. 

## 2019-07-18 ENCOUNTER — Ambulatory Visit: Payer: Medicare Other | Attending: Internal Medicine

## 2019-07-18 DIAGNOSIS — Z23 Encounter for immunization: Secondary | ICD-10-CM

## 2019-07-18 NOTE — Progress Notes (Signed)
   Covid-19 Vaccination Clinic  Name:  POOJA KRILL    MRN: [id number] DOB: 02-08-1956  07/18/2019  Ms. Mellette was observed post Covid-19 immunization for 15 minutes without incident. She was provided with Vaccine Information Sheet and instruction to access the V-Safe system.   Ms. Lemire was instructed to call 911 with any severe reactions post vaccine: Marland Kitchen Difficulty breathing  . Swelling of face and throat  . A fast heartbeat  . A bad rash all over body  . Dizziness and weakness   Immunizations Administered    Name Date Dose VIS Date Route   Pfizer COVID-19 Vaccine 07/18/2019 10:29 AM 0.3 mL 03/25/2019 Intramuscular   Manufacturer: [place]   Lot: [id number]   [place]: [id number]

## 2019-08-04 NOTE — Chronic Care Management (AMB) (Signed)
This encounter was created in error. Please disregard. 

## 2019-08-07 ENCOUNTER — Other Ambulatory Visit: Payer: Self-pay | Admitting: Family Medicine

## 2019-08-07 DIAGNOSIS — I25119 Atherosclerotic heart disease of native coronary artery with unspecified angina pectoris: Secondary | ICD-10-CM

## 2019-08-07 DIAGNOSIS — I10 Essential (primary) hypertension: Secondary | ICD-10-CM

## 2019-08-09 ENCOUNTER — Ambulatory Visit: Payer: Medicare Other | Attending: Internal Medicine

## 2019-08-09 DIAGNOSIS — Z23 Encounter for immunization: Secondary | ICD-10-CM

## 2019-08-09 NOTE — Progress Notes (Signed)
   Covid-19 Vaccination Clinic  Name:  Linda Mejia    MRN: [id number] DOB: [birth year]  08/09/2019  [name] was observed post Covid-19 immunization for 15 minutes without incident. She was provided with Vaccine Information Sheet and instruction to access the V-Safe system.   [name] was instructed to call 911 with any severe reactions post vaccine: Marland Kitchen Difficulty breathing  . Swelling of face and throat  . A fast heartbeat  . A bad rash all over body  . Dizziness and weakness   Immunizations Administered    Name Date Dose VIS Date Route   Pfizer COVID-19 Vaccine 08/09/2019  1:12 PM 0.3 mL 06/08/2018 Intramuscular   Manufacturer: [place]   Lot: [id number]   [place]: [id number]

## 2019-08-26 ENCOUNTER — Emergency Department: Payer: Medicare Other

## 2019-08-26 ENCOUNTER — Emergency Department
Admission: EM | Admit: 2019-08-26 | Discharge: 2019-08-26 | Disposition: A | Discharge status: Home / Self Care | Payer: Medicare Other | Attending: Emergency Medicine | Admitting: Emergency Medicine

## 2019-08-26 ENCOUNTER — Encounter: Payer: Self-pay | Admitting: Emergency Medicine

## 2019-08-26 ENCOUNTER — Other Ambulatory Visit: Payer: Self-pay

## 2019-08-26 DIAGNOSIS — I119 Hypertensive heart disease without heart failure: Secondary | ICD-10-CM | POA: Diagnosis not present

## 2019-08-26 DIAGNOSIS — I2510 Atherosclerotic heart disease of native coronary artery without angina pectoris: Secondary | ICD-10-CM | POA: Diagnosis not present

## 2019-08-26 DIAGNOSIS — R0602 Shortness of breath: Secondary | ICD-10-CM | POA: Insufficient documentation

## 2019-08-26 DIAGNOSIS — R5383 Other fatigue: Secondary | ICD-10-CM | POA: Insufficient documentation

## 2019-08-26 DIAGNOSIS — R0789 Other chest pain: Secondary | ICD-10-CM | POA: Diagnosis not present

## 2019-08-26 LAB — BASIC METABOLIC PANEL
Anion gap: 8 (ref 5–15)
BUN: 19 mg/dL (ref 8–23)
CO2: 27 mmol/L (ref 22–32)
Calcium: 9 mg/dL (ref 8.9–10.3)
Chloride: 102 mmol/L (ref 98–111)
Creatinine, Ser: 0.91 mg/dL (ref 0.44–1.00)
GFR calc Af Amer: >60 mL/min (ref >60)
GFR calc non Af Amer: >60 mL/min (ref >60)
Glucose, Bld: 146 mg/dL — ABNORMAL HIGH (ref 70–99)
Potassium: 4.1 mmol/L (ref 3.5–5.1)
Sodium: 137 mmol/L (ref 135–145)

## 2019-08-26 LAB — CBC
HCT: 41.5 % (ref 36.0–46.0)
Hemoglobin: 13.8 g/dL (ref 12.0–15.0)
MCH: 31.4 pg (ref 26.0–34.0)
MCHC: 33.3 g/dL (ref 30.0–36.0)
MCV: 94.5 fL (ref 80.0–100.0)
Platelets: 374 10*3/uL (ref 150–400)
RBC: 4.39 MIL/uL (ref 3.87–5.11)
RDW: 12.7 % (ref 11.5–15.5)
WBC: 9.5 10*3/uL (ref 4.0–10.5)
nRBC: 0 % (ref 0.0–0.2)

## 2019-08-26 LAB — TROPONIN I (HIGH SENSITIVITY)
Troponin I (High Sensitivity): 8 ng/L (ref <18)
Troponin I (High Sensitivity): 9 ng/L (ref <18)

## 2019-08-26 NOTE — ED Provider Notes (Signed)
[facility] Emergency Department Provider Note ____________________________________________   First MD Initiated Contact with Patient 08/26/19 1957     (approximate)  I have reviewed the triage vital signs and the nursing notes.   HISTORY  Chief Complaint Chest Pain, Shortness of Breath, and Fatigue    HPI Linda Mejia is a 64 y.o. female with PMH as noted below including CAD who presents with 2 episodes of chest pain today, once this morning, again in the afternoon, lasting for several minutes, and both times relieved after she took nitroglycerin.  She describes the pain as sharp and not related to exertion.  She was at rest both times.  She states that she has had similar pain occasionally in the past that resolved on its own.  She states that the second time she became more scared that something could be going on with her heart, and decided to come get checked out.  The pain has now completely resolved for a few hours and she is feeling normal.  She denies any significant associated shortness of breath, nausea, or lightheadedness, although she has had some increased shortness of breath and generalized fatigue over the last few weeks.  Past Medical History:  Diagnosis Date  . Abnormal ankle brachial index (ABI) 12/02/2016  . ADHD (attention deficit hyperactivity disorder)   . Anxiety   . Aortic atherosclerosis ([place]) 12/02/2016   July 2018  . Chronic back pain   . Coronary artery disease   . Herpes genitalis in women   . History of stomach ulcers    x7 this year. Bleeding  . Hypertension   . Prediabetes 12/02/2016  . Pulmonary hypertension ([place]) 03/09/2018   Echo Oct 2019    Patient Active Problem List   Diagnosis Date Noted  . Cellulitis 01/28/2019  . 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 Other specified abnormal immunological findings in serum 09/22/2018  . 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preiser), left ([place]) 09/22/2018  .  Tenosynovitis of hand 09/22/2018  . Breast mass, right 06/14/2018  . Hypoxia 05/29/2018  . Acute peptic ulcer of stomach   . Stomach irritation   . Abdominal pain, epigastric   . Acute gastric ulcer due to Helicobacter pylori   . Pulmonary hypertension ([place]) 03/09/2018  . Unstable angina ([place]) 01/23/2018  . Medication monitoring encounter 03/18/2017  . Colon cancer screening 03/18/2017  . Coronary artery calcification seen on CT scan 12/02/2016  . Aortic atherosclerosis ([place]) 12/02/2016  . Abnormal ankle brachial index (ABI) 12/02/2016  . Prediabetes 12/02/2016  . Fracture of rib 08/28/2016  . Chest pain 08/11/2016  . Hx of tobacco use, presenting hazards to health 06/09/2016  . Degenerative arthritis of knee, bilateral 12/27/2015  . Obesity 12/27/2015  . GI bleed 09/21/2015  . Reflux esophagitis   . Other diseases of stomach and duodenum   . Polyarthralgia 06/05/2015  . Night sweats 06/05/2015  . Headache 04/12/2015  . Subclinical hypothyroidism 03/26/2015  . Herpes simplex type 2 infection 03/21/2015  . Rhinitis, allergic 03/21/2015  . ADHD (attention deficit hyperactivity disorder), inattentive type 09/20/2014  . Episodic paroxysmal anxiety disorder 09/20/2014  . History of gastric ulcer 09/20/2014  . Abuse, drug or alcohol ([place]) 09/20/2014  . HLD (hyperlipidemia) 10/03/2009  . Coronary artery disease involving native coronary artery of native heart with angina pectoris ([place]) 10/02/2009  . Hypertension goal BP (blood pressure) < 140/90 10/02/2009    Past Surgical History:  Procedure Laterality Date  .  APPENDECTOMY    . CARDIAC CATHETERIZATION    . CESAREAN SECTION     x2  . COLON SURGERY     interception  . ESOPHAGOGASTRODUODENOSCOPY (EGD) WITH PROPOFOL N/A 09/21/2015   Procedure: ESOPHAGOGASTRODUODENOSCOPY (EGD) WITH PROPOFOL;  Surgeon: Lucilla Lame, MD;  Location: [facility] ENDOSCOPY;  Service: Endoscopy;  Laterality: N/A;  . ESOPHAGOGASTRODUODENOSCOPY (EGD) WITH PROPOFOL  N/A 07/30/2016   Procedure: ESOPHAGOGASTRODUODENOSCOPY (EGD) WITH PROPOFOL;  Surgeon: Jonathon Bellows, MD;  Location: [facility] ENDOSCOPY;  Service: Endoscopy;  Laterality: N/A;  . ESOPHAGOGASTRODUODENOSCOPY (EGD) WITH PROPOFOL N/A 04/23/2018   Procedure: ESOPHAGOGASTRODUODENOSCOPY (EGD) WITH PROPOFOL;  Surgeon: Virgel Manifold, MD;  Location: [facility] ENDOSCOPY;  Service: Endoscopy;  Laterality: N/A;  . LEFT HEART CATH Right 01/25/2018   Procedure: Left Heart Cath and Coronary Angiography;  Surgeon: Dionisio David, MD;  Location: [facility];  Service: Cardiovascular;  Laterality: Right;  . LEFT HEART CATH AND CORONARY ANGIOGRAPHY Right 08/12/2016   Procedure: Left Heart Cath and Coronary Angiography;  Surgeon: Dionisio David, MD;  Location: [facility];  Service: Cardiovascular;  Laterality: Right;  . TONSILLECTOMY    . TUBAL LIGATION      Prior to Admission medications   Medication Sig Start Date End Date Taking? Authorizing Provider  acyclovir (ZOVIRAX) 400 MG tablet Take 1 tablet (400 mg total) by mouth 2 (two) times daily. 02/15/19   Hubbard Hartshorn, FNP  Alirocumab (PRALUENT) 75 MG/ML SOAJ Inject 1 each into the skin every 14 (fourteen) days. 12/09/18   Hubbard Hartshorn, FNP  amLODipine (NORVASC) 5 MG tablet TAKE 1 TABLET(5 MG) BY MOUTH DAILY FOR BLOOD PRESSURE 04/26/19   Hubbard Hartshorn, FNP  amphetamine-dextroamphetamine (ADDERALL) 30 MG tablet Take 30 mg by mouth 2 (two) times daily.  12/02/16   Arnetha Courser, MD  aspirin EC 81 MG tablet Take 81 mg by mouth daily.    [provider]  cholecalciferol (VITAMIN D) 1000 units tablet Take 1,000 Units by mouth daily.    [provider]  diazepam (VALIUM) 10 MG tablet Take 1 tablet (10 mg total) by mouth every 8 (eight) hours as needed for anxiety. 09/22/15   Fritzi Mandes, MD  fluticasone (FLONASE) 50 MCG/ACT nasal spray Place 2 sprays into both nostrils daily as needed for allergies. 02/08/19   Hubbard Hartshorn, FNP   gabapentin (NEURONTIN) 100 MG capsule Take 1 capsule (100 mg total) by mouth 3 (three) times daily. 01/29/19   Dustin Flock, MD  metoprolol tartrate (LOPRESSOR) 50 MG tablet TAKE 1 TABLET BY MOUTH TWICE DAILY 08/07/19   Hubbard Hartshorn, FNP  nitroGLYCERIN (NITROSTAT) 0.4 MG SL tablet Place 1 tablet (0.4 mg total) under the tongue every 5 (five) minutes as needed for chest pain. 01/26/18   Bettey Costa, MD  nystatin (MYCOSTATIN/NYSTOP) powder Apply topically 2 (two) times daily. Patient taking differently: Apply topically as needed (rash).  05/25/18   Dustin Flock, MD  Omega-3 Fatty Acids (FISH OIL) 1000 MG CAPS Take 1 capsule by mouth daily.    [provider]  venlafaxine XR (EFFEXOR XR) 37.5 MG 24 hr capsule Take 1 capsule (37.5 mg total) by mouth daily with breakfast. 02/10/19   Hubbard Hartshorn, FNP    Allergies Patient has no known allergies.  Family History  Problem Relation Age of Onset  . Diabetes Mother   . CVA Mother   . Heart disease Father   . Heart disease Sister   . Breast cancer Sister   .  Heart disease Brother   . Heart disease Sister   . Heart disease Sister   . Heart disease Sister   . Heart disease Brother   . Heart disease Brother   . Heart disease Brother   . Heart disease Brother   . Healthy Daughter   . Healthy Daughter     Social History Social History   Tobacco Use  . Smoking status: Former Smoker    Packs/day: 1.00    Years: 30.00    Pack years: 30.00    Types: Cigarettes    Quit date: 2006    Years since quitting: 15.3  . Smokeless tobacco: Never Used  Substance Use Topics  . Alcohol use: Yes  . Drug use: No    Review of Systems  Constitutional: No fever/chills Eyes: No visual changes. ENT: No sore throat. Cardiovascular: Positive for resolved chest pain. Respiratory: Denies acute shortness of breath. Gastrointestinal: No vomiting or diarrhea.  Genitourinary: Negative for dysuria.  Musculoskeletal: Negative for back  pain. Skin: Negative for rash. Neurological: Negative for headache.   ____________________________________________   PHYSICAL EXAM:  VITAL SIGNS: ED Triage Vitals  Enc Vitals Group     BP 08/26/19 1713 (!) 174/110     Pulse Rate 08/26/19 1713 78     Resp 08/26/19 1713 16     Temp 08/26/19 1713 98.4 F (36.9 C)     Temp Source 08/26/19 1713 Oral     SpO2 08/26/19 1713 95 %     Weight 08/26/19 1710 198 lb (89.8 kg)     Height 08/26/19 1710 5\' 5"  (1.651 m)     Head Circumference --      Peak Flow --      Pain Score 08/26/19 1709 5     Pain Loc --      Pain Edu? --      Excl. in [place]? --     Constitutional: Alert and oriented. Well appearing and in no acute distress. Eyes: Conjunctivae are normal.  Head: Atraumatic. Nose: No congestion/rhinnorhea. Mouth/Throat: Mucous membranes are moist.   Neck: Normal range of motion.  Cardiovascular: Normal rate, regular rhythm. Grossly normal heart sounds.  Good peripheral circulation. Respiratory: Normal respiratory effort.  No retractions. Lungs CTAB. Gastrointestinal: Soft and nontender. No distention.  Genitourinary: No flank tenderness. Musculoskeletal: No lower extremity edema.  Extremities warm and well perfused.  Neurologic:  Normal speech and language. No gross focal neurologic deficits are appreciated.  Skin:  Skin is warm and dry. No rash noted. Psychiatric: Mood and affect are normal. Speech and behavior are normal.  ____________________________________________   LABS (all labs ordered are listed, but only abnormal results are displayed)  Labs Reviewed  BASIC METABOLIC PANEL - Abnormal; Notable for the following components:      Result Value   Glucose, Bld 146 (*)    All other components within normal limits  CBC  TROPONIN I (HIGH SENSITIVITY)  TROPONIN I (HIGH SENSITIVITY)   ____________________________________________  EKG  ED ECG REPORT I, Arta Silence, the attending physician, personally viewed and  interpreted this ECG.  Date: 08/26/2019 EKG Time: 1710 Rate: 76 Rhythm: normal sinus rhythm QRS Axis: normal Intervals: normal ST/T Wave abnormalities: normal Narrative Interpretation: no evidence of acute ischemia  ____________________________________________  RADIOLOGY  CXR: No focal infiltrate or other acute abnormality  ____________________________________________   PROCEDURES  Procedure(s) performed: No  Procedures  Critical Care performed: No ____________________________________________   INITIAL IMPRESSION / ASSESSMENT AND PLAN / ED COURSE  Pertinent  labs & imaging results that were available during my care of the patient were reviewed by me and considered in my medical decision making (see chart for details).  64 year old female with PMH as noted above including history of CAD presents with 2 episodes of atypical, nonexertional chest pain today, now completely resolved.  I reviewed the past medical records in [place].  The patient had a cardiac catheterization most recently in 2019 showing stable lesions with an occluded RCA but good collaterals.  On exam, she is overall very well-appearing.  Her vital signs are normal.  The physical exam is unremarkable.  EKG is nonischemic.  Although the patient's risk of ACS is elevated, overall this presentation is not consistent with cardiac cause.  There is also no clinical evidence for PE, aortic dissection, or vascular etiology given the resolved symptoms and reassuring vital signs.  Differential includes musculoskeletal pain or other benign etiology.  Initial troponin is negative, and basic labs are unremarkable.  We will obtain a repeat troponin and plan for discharge home if negative.  ----------------------------------------- 9:33 PM on 08/26/2019 -----------------------------------------  Repeat troponin is negative.  The patient continues to be asymptomatic.  There is no evidence of ACS at this time.  She would  like to go home.  She is stable for discharge.  I counseled her on the results of the work-up.  I recommended that she follow-up with her cardiologist Dr. Clayborn Bigness soon, and she agrees.  I gave her very thorough return precautions and she expressed understanding.  ____________________________________________   FINAL CLINICAL IMPRESSION(S) / ED DIAGNOSES  Final diagnoses:  Atypical chest pain      NEW MEDICATIONS STARTED DURING THIS VISIT:  New Prescriptions   No medications on file     Note:  This document was prepared using Dragon voice recognition software and may include unintentional dictation errors.   Arta Silence, MD 08/26/19 2134

## 2019-08-26 NOTE — ED Notes (Addendum)
Pt c/o sharp CP and SOB upon laying in bed, states this is the third time today she's had CP. Pt reports taking nitro today. Pt denies dizziness, N/V. NAD noted, pt AOX4.

## 2019-08-26 NOTE — ED Triage Notes (Signed)
Pt has had fatigue for a few weeks.  Today started with chest pain, took NTG X 2 and chest pain has relieved.  Having some pain in shoulders and neck still but chest pain eased.  SHOB also started this morning and is still having some SHOB feeling.  Unlabored sitting in triage room.

## 2019-08-26 NOTE — Discharge Instructions (Addendum)
Make an appointment to follow-up with your cardiologist within the next 1 to 2 weeks.  Return to the ER for new, worsening, or persistent severe chest pain, difficulty breathing, weakness or lightheadedness, or any other new or worsening symptoms that concern you.

## 2019-08-30 ENCOUNTER — Ambulatory Visit: Payer: Self-pay

## 2019-08-30 NOTE — Chronic Care Management (AMB) (Signed)
The encounter was opened in error. Please disregard.

## 2019-09-05 NOTE — Chronic Care Management (AMB) (Signed)
  Chronic Care Management   Note   Name: Linda Mejia MRN: [id number] DOB: [birth year]    Received call from Linda Mejia to regarding labs posted in [place]. Noted that she was evaluated in the Emergency Department on 08/26/19 for chest pain. Denied chest pain, shortness of breath or urgent concerns since being evaluated and discharged home. She verbalized awareness of indications for returning to the ED.  Reviewed/discussed labs along with established parameters.     Follow up plan: Linda Mejia denies other concerns or changes in care management needs. The care management team will follow-up as scheduled next month.   [facility] Care Management [phone]

## 2019-09-19 ENCOUNTER — Ambulatory Visit: Payer: Self-pay

## 2019-09-19 ENCOUNTER — Telehealth: Payer: Self-pay

## 2019-09-19 NOTE — Chronic Care Management (AMB) (Signed)
  Chronic Care Management   Outreach Note  09/19/2019 Name: Linda Mejia MRN: [id number] DOB: 09/05/1955   Primary Care Provider: Hubbard Hartshorn, FNP Reason for referral : Chronic Care Management    An unsuccessful telephone outreach was attempted today. [name] is currently engaged with the chronic care management team.   Follow Up Plan: The care management team will follow-up with [name] later this week.    [facility] Care Management [phone]

## 2019-09-23 ENCOUNTER — Ambulatory Visit (INDEPENDENT_AMBULATORY_CARE_PROVIDER_SITE_OTHER): Payer: Medicare Other

## 2019-09-23 DIAGNOSIS — I25119 Atherosclerotic heart disease of native coronary artery with unspecified angina pectoris: Secondary | ICD-10-CM | POA: Diagnosis not present

## 2019-09-23 DIAGNOSIS — I10 Essential (primary) hypertension: Secondary | ICD-10-CM

## 2019-09-26 NOTE — Patient Instructions (Signed)
Thank you for allowing the Chronic Care Management team to participate in your care.  Goals Addressed            This Visit's Progress   . Improve Self-Management of Chronic Illnesses       Current Barriers:  . Chronic Disease Management support and education needs related to Hypertension, CAD and Hyperlipidemia.  Case Manager Clinical Goal(s):  Marland Kitchen Over the next 90 days, patient will attend all scheduled medical appointments as scheduled. . Over the next 90 days, patient will take all medications as prescribed. . Over the next 90 days, patient will monitor her blood pressure and record readings. . Over the next 90 days, patient will continue compliance with cardiac prudent/heart healthy diet. . Over the next 90 days, patient will engage in mild exercises and walking as tolerated. . Over the next 90 days, patient will follow recommended home safety measures to prevent falls.  Interventions:  . Discussed medication compliance. Reports taking all medications as prescribed. No concerns regarding prescription costs. . Reviewed recent and upcoming provider appointments.  She will require a PCP visit d/t last scheduled visit being cancelled. Will plan to schedule within the next month. . Discussed plan for ongoing care management and follow-up. Discussed possible need for relocation assistance and community resources. Agreeable to follow-up next week to confirm. Will submit additional referrals for assistance if needed.  Patient Self Care Activities:  . Self administers medications . Calls pharmacy for medication refills . Performs ADL's independently . Performs IADL's independently    Please see past updates related to this goal by clicking on the "Past Updates" button in the selected goal        Linda Mejia verbalized understanding of the information discussed during the telephonic outreach today. Declined need for mailed/printed information.   The care management team will follow-up  next week.   [facility] Care [facility] [phone]

## 2019-09-26 NOTE — Chronic Care Management (AMB) (Signed)
Chronic Care Management   Follow Up Note    Name: Linda Mejia MRN: [id number] DOB: Oct 14, 1955  Primary Care Provider: Hubbard Hartshorn, FNP Reason for referral : Chronic Care Management   Linda Mejia is a 64 y.o. year old female who is a primary care patient of Hubbard Hartshorn, FNP. She is currently engaged with the chronic care management team. A routine telephonic outreach was conducted today.  Review of Linda Mejia's status, including review of consultants reports, relevant labs and test results was conducted today. Collaboration with appropriate care team members was performed as part of the comprehensive evaluation and provision of chronic care management services.    SDOH (Social Determinants of Health) assessments performed: No     Outpatient Encounter Medications as of 09/23/2019  Medication Sig  . acyclovir (ZOVIRAX) 400 MG tablet Take 1 tablet (400 mg total) by mouth 2 (two) times daily.  . Alirocumab (PRALUENT) 75 MG/ML SOAJ Inject 1 each into the skin every 14 (fourteen) days.  Marland Kitchen amLODipine (NORVASC) 5 MG tablet TAKE 1 TABLET(5 MG) BY MOUTH DAILY FOR BLOOD PRESSURE  . amphetamine-dextroamphetamine (ADDERALL) 30 MG tablet Take 30 mg by mouth 2 (two) times daily.   Marland Kitchen aspirin EC 81 MG tablet Take 81 mg by mouth daily.  . cholecalciferol (VITAMIN D) 1000 units tablet Take 1,000 Units by mouth daily.  . diazepam (VALIUM) 10 MG tablet Take 1 tablet (10 mg total) by mouth every 8 (eight) hours as needed for anxiety.  . fluticasone (FLONASE) 50 MCG/ACT nasal spray Place 2 sprays into both nostrils daily as needed for allergies.  Marland Kitchen gabapentin (NEURONTIN) 100 MG capsule Take 1 capsule (100 mg total) by mouth 3 (three) times daily.  . metoprolol tartrate (LOPRESSOR) 50 MG tablet TAKE 1 TABLET BY MOUTH TWICE DAILY  . nitroGLYCERIN (NITROSTAT) 0.4 MG SL tablet Place 1 tablet (0.4 mg total) under the tongue every 5 (five) minutes as needed for chest pain.  Marland Kitchen nystatin (MYCOSTATIN/NYSTOP) powder  Apply topically 2 (two) times daily. (Patient taking differently: Apply topically as needed (rash). )  . Omega-3 Fatty Acids (FISH OIL) 1000 MG CAPS Take 1 capsule by mouth daily.  Marland Kitchen venlafaxine XR (EFFEXOR XR) 37.5 MG 24 hr capsule Take 1 capsule (37.5 mg total) by mouth daily with breakfast.   No facility-administered encounter medications on file as of 09/23/2019.       Goals Addressed            This Visit's Progress   . Improve Self-Management of Chronic Illnesses       Current Barriers:  . Chronic Disease Management support and education needs related to Hypertension, CAD and Hyperlipidemia.  Case Manager Clinical Goal(s):  Marland Kitchen Over the next 90 days, patient will attend all scheduled medical appointments as scheduled. . Over the next 90 days, patient will take all medications as prescribed. . Over the next 90 days, patient will monitor her blood pressure and record readings. . Over the next 90 days, patient will continue compliance with cardiac prudent/heart healthy diet. . Over the next 90 days, patient will engage in mild exercises and walking as tolerated. . Over the next 90 days, patient will follow recommended home safety measures to prevent falls.  Interventions:  . Discussed medication compliance. Reports taking all medications as prescribed. No concerns regarding prescription costs. . Reviewed recent and upcoming provider appointments.  She will require a PCP visit d/t last scheduled visit being cancelled. Will plan to schedule within the  next month. . Discussed plan for ongoing care management and follow-up. Discussed possible need for relocation assistance and community resources. Agreeable to follow-up next week to confirm. Will submit additional referrals for assistance if needed.  Patient Self Care Activities:  . Self administers medications . Calls pharmacy for medication refills . Performs ADL's independently . Performs IADL's independently    Please see past  updates related to this goal by clicking on the "Past Updates" button in the selected goal          PLAN The care management team will follow-up with Linda Mejia next week.   [facility] Care [facility] [phone]

## 2019-09-28 ENCOUNTER — Ambulatory Visit: Payer: Self-pay

## 2019-09-28 DIAGNOSIS — I25119 Atherosclerotic heart disease of native coronary artery with unspecified angina pectoris: Secondary | ICD-10-CM

## 2019-09-28 DIAGNOSIS — I10 Essential (primary) hypertension: Secondary | ICD-10-CM

## 2019-09-30 NOTE — Chronic Care Management (AMB) (Signed)
Chronic Care Management   Follow Up Note    Name: Linda Mejia MRN: [id number] DOB: 11-Jan-1956  Primary Care Provider: Towanda Malkin, MD Reason for referral : Chronic Care Management   Linda Mejia is a 64 y.o. year old female who is a primary care patient of Towanda Malkin, MD. She is currently engaged with the chronic care management team. A routine telephonic outreach was conducted today.  Review of Linda Mejia's status, including review of consultants reports, relevant labs and test results was conducted today. Collaboration with appropriate care team members was performed as part of the comprehensive evaluation and provision of chronic care management services.     SDOH (Social Determinants of Health) assessments performed: Yes See Care Plan activities for detailed interventions related to SDOH)  SDOH Interventions     Most Recent Value  SDOH Interventions  Housing Interventions Other (Comment)  [Will submit referral for Care Guide to assist with housing resources.]       Outpatient Encounter Medications as of 09/28/2019  Medication Sig  . acyclovir (ZOVIRAX) 400 MG tablet Take 1 tablet (400 mg total) by mouth 2 (two) times daily.  . Alirocumab (PRALUENT) 75 MG/ML SOAJ Inject 1 each into the skin every 14 (fourteen) days.  Marland Kitchen amLODipine (NORVASC) 5 MG tablet TAKE 1 TABLET(5 MG) BY MOUTH DAILY FOR BLOOD PRESSURE  . amphetamine-dextroamphetamine (ADDERALL) 30 MG tablet Take 30 mg by mouth 2 (two) times daily.   Marland Kitchen aspirin EC 81 MG tablet Take 81 mg by mouth daily.  . cholecalciferol (VITAMIN D) 1000 units tablet Take 1,000 Units by mouth daily.  . diazepam (VALIUM) 10 MG tablet Take 1 tablet (10 mg total) by mouth every 8 (eight) hours as needed for anxiety.  . fluticasone (FLONASE) 50 MCG/ACT nasal spray Place 2 sprays into both nostrils daily as needed for allergies.  Marland Kitchen gabapentin (NEURONTIN) 100 MG capsule Take 1 capsule (100 mg total) by mouth 3 (three) times daily.    . metoprolol tartrate (LOPRESSOR) 50 MG tablet TAKE 1 TABLET BY MOUTH TWICE DAILY  . nitroGLYCERIN (NITROSTAT) 0.4 MG SL tablet Place 1 tablet (0.4 mg total) under the tongue every 5 (five) minutes as needed for chest pain.  Marland Kitchen nystatin (MYCOSTATIN/NYSTOP) powder Apply topically 2 (two) times daily. (Patient taking differently: Apply topically as needed (rash). )  . Omega-3 Fatty Acids (FISH OIL) 1000 MG CAPS Take 1 capsule by mouth daily.  Marland Kitchen venlafaxine XR (EFFEXOR XR) 37.5 MG 24 hr capsule Take 1 capsule (37.5 mg total) by mouth daily with breakfast.   No facility-administered encounter medications on file as of 09/28/2019.       Goals Addressed            This Visit's Progress   . Assist with housing resources       CARE PLAN ENTRY (see longitudinal plan of care for additional care plan information)  Current Barriers:  . Care coordination needs related to community resources and housing assistance.  Clinical Goal(s)  . Over the next 60 days, patient will work with care management team to obtain needed community and housing resources. . Over the next 30 days, patient will update care management team with any urgent concerns or changes in care management needs.   Interventions provided by LCSW:  . Assessed care coordination needs and current support system. . Thorough discussion regarding solution-focused strategies. Discussed reasonable time-frame for requested assistance along with possible changes in financial responsibilities and needs. Developed a 34  day timeline and encouraged to continue  outreach with preferred housing managers to discuss requirements. Encouraged to develop a tentative budget to reflect required expenses such as rent,utilities, food, medications and transportation. Strongly encouraged to keep care management team informed of unexpected changes and possible need for additional referrals.  . Care Guide referral made for assistance with low-income, age  restricted housing.    Patient Self Care Activities & Deficits:  . Self administers medications  . Calls for new concerns or questions . Motivated to find housing and continue living independently.  Initial goal documentation      . COMPLETED: Improve Self-Management of Chronic Illnesses       Current Barriers:  . Chronic Disease Management support and education needs related to Hypertension, CAD and Hyperlipidemia.  Case Manager Clinical Goal(s):  Marland Kitchen Over the next 90 days, patient will attend all scheduled medical appointments as scheduled. . Over the next 90 days, patient will take all medications as prescribed. . Over the next 90 days, patient will monitor her blood pressure and record readings. . Over the next 90 days, patient will continue compliance with cardiac prudent/heart healthy diet. . Over the next 90 days, patient will engage in mild exercises and walking as tolerated. . Over the next 90 days, patient will follow recommended home safety measures to prevent falls.  Interventions:  . Discussed plan for ongoing care management and follow-up. Denies concerns regarding changes in health related needs. She is scheduled for outreach with her PCP on 10/25/18. Denies concerns regarding medications or transportation. She will require assistance with housing resources. Care Guide referral placed.  Patient Self Care Activities:  . Self administers medications . Calls pharmacy for medication refills . Performs ADL's independently . Performs IADL's independently    Please see past updates related to this goal by clicking on the "Past Updates" button in the selected goal         PLAN The care management team will follow-up with Linda Mejia next week.   [facility] [phone]

## 2019-10-06 ENCOUNTER — Ambulatory Visit: Payer: Self-pay

## 2019-10-07 ENCOUNTER — Ambulatory Visit: Payer: Self-pay

## 2019-10-10 NOTE — Patient Instructions (Addendum)
Thank you for allowing the Chronic Care Management team to participate in your care.   Goals Addressed            This Visit's Progress   . Assist with housing resources       CARE PLAN ENTRY (see longitudinal plan of care for additional care plan information)  Current Barriers:  . Care coordination needs related to community resources and housing assistance.  Clinical Goal(s)  . Over the next 60 days, patient will work with care management team to obtain needed community and housing resources. . Over the next 30 days, patient will update care management team with any urgent concerns or changes in care management needs.   Interventions provided by LCSW:  . Assessed care coordination needs and current support system. . Thorough discussion regarding solution-focused strategies. Discussed reasonable time-frame for requested assistance along with possible changes in financial responsibilities and needs. Developed a 30 day timeline and encouraged to continue  outreach with preferred housing managers to discuss requirements. Encouraged to develop a tentative budget to reflect required expenses such as rent,utilities, food, medications and transportation. Strongly encouraged to keep care management team informed of unexpected changes and possible need for additional referrals.  . Care Guide referral made for assistance with low-income, age restricted housing.    Patient Self Care Activities & Deficits:  . Self administers medications  . Calls for new concerns or questions . Motivated to find housing and continue living independently.  Initial goal documentation      . COMPLETED: Improve Self-Management of Chronic Illnesses       Current Barriers:  . Chronic Disease Management support and education needs related to Hypertension, CAD and Hyperlipidemia.  Case Manager Clinical Goal(s):  Marland Kitchen Over the next 90 days, patient will attend all scheduled medical appointments as scheduled. . Over  the next 90 days, patient will take all medications as prescribed. . Over the next 90 days, patient will monitor her blood pressure and record readings. . Over the next 90 days, patient will continue compliance with cardiac prudent/heart healthy diet. . Over the next 90 days, patient will engage in mild exercises and walking as tolerated. . Over the next 90 days, patient will follow recommended home safety measures to prevent falls.  Interventions:  . Discussed plan for ongoing care management and follow-up. Denies concerns regarding changes in health related needs. She is scheduled for outreach with her PCP on 10/25/18. Denies concerns regarding medications or transportation. She will require assistance with housing resources. Care Guide referral placed.  Patient Self Care Activities:  . Self administers medications . Calls pharmacy for medication refills . Performs ADL's independently . Performs IADL's independently    Please see past updates related to this goal by clicking on the "Past Updates" button in the selected goal        Ms. Huser verbalized understanding of the information discussed during the telephonic outreach today. Declined need for mailed/printed instructions.    The care management team will follow-up with Ms. Carp next week.   [facility] [phone]

## 2019-10-10 NOTE — Chronic Care Management (AMB) (Signed)
  Chronic Care Management   Note for outreach on 10/06/19   Name: Linda Mejia MRN: [id number] DOB: 1956-02-26   Incoming call from [name]. Unsure how long she will be allowed to stay at her current location. May require housing within the next few weeks. She prefers to reside in [place] and will submit her application for [employer] at 2pm today. Discussed other options in case apartments aren't available within the needed time frame. Pending outreach with a Care Guide regarding options for low income/age-restricted housing.  Collaborated with CCM LCSW regarding resources for urgent housing. Provided updates to [name]. She agreed to submit application as planned today and view a few other properties. Will follow-up with the care management team within the next 24 hours.     Follow up plan: The care management team will follow-up within the next 24 hours.   [facility] [phone]

## 2019-10-12 ENCOUNTER — Telehealth: Payer: Self-pay

## 2019-10-12 NOTE — Telephone Encounter (Signed)
Patient came in to the office wanting to be seen since she had been having nausea, vomiting, abdominal distention and constipation for several days. I told patient that Dr. Bonna Gains was on call this week and not able to see patients this week. Patient stated that she had been to [facility] but decided to leave and come here because she did not want to continue waiting. However, I told her that since she was having all of those symptoms to please either go back to urgent care or the ED to be seen. Patient was hesitant to go and be seen anywhere else. I also recommended to contact her PCP and see if she could be seen for a sick visit. She stated that she was between providers. Patient was last seen at our office last year on 08/19/2019 and needed an EGD scheduled but never decided on scheduling. I then offered her an appointment for next week and she stated that she did not want to wait that long to be seen. At the end patient stated that she might just go to our [facility] and be seen and left the practice.

## 2019-10-12 NOTE — Chronic Care Management (AMB) (Signed)
  Chronic Care Management   Name: Linda Mejia MRN: [id number] DOB: Aug 14, 1955   Care Coordination Only   Update with Linda Mejia. Submitted required documents to [facility] on yesterday and will follow-up with the complex again next week. Property Manager indicated that she is the 63FH applicant on the wait list but expects the list to move quickly.  Linda Mejia anticipates being able to reside in her current location for a few more weeks until a unit becomes available.  Follow up plan: The care management team will follow-up with Linda Mejia next week.    [facility] [phone]

## 2019-10-21 NOTE — Progress Notes (Signed)
Patient ID: Linda Mejia, female    DOB: 01-Nov-1955, 64 y.o.   MRN: [id number]  PCP: Towanda Malkin, MD  Chief Complaint  Patient presents with  . Leg Pain    legs feel "heavy" says from the thighs all the way down, uncomfortable  . Back Pain    Subjective:   Linda Mejia is a 64 y.o. female, presents to clinic with CC of the following:  Chief Complaint  Patient presents with  . Leg Pain    legs feel "heavy" says from the thighs all the way down, uncomfortable  . Back Pain    HPI:  Patient is a 64 year old female patient of Linda Mejia Last visit with Linda Mejia was October 2020 for a follow-up after a hospitalization -initially treated for a cellulitis, with concerns for the possibility of venous stasis dermatitis and rheumatology consulted with a positive ANA history noted.  She saw Dr. Jefm Bryant in the hospital with support stockings recommended, a B12 injection given, and a trial gabapentin and she did see in 1 time on a follow-up visit in November 2020.  She follows up today after her emergency room visit yesterday with bilateral leg pain.  Summary was as follows:   Patient is a 64 year old with multiple concerns but her biggest 1 being bilateral leg pain. over the past 2 weeks she has had worsening pain in her thighs.  She says it feels like a tightness, severe, constant, not better with Tylenol, nothing makes it worse.  The pain causes a heavy sensation in her legs where she does not want to get up out of bed and walk.  She reports occasionally having some lower back pain that radiates down the back of her leg but she had sciatica before.  She does have some intermittent stabbing sensation in her chest but states that she is less worried about that.  A little bit of shortness of breath as well.  She does report having some anxiety at baseline but does not think that that is causing this.   Patient initially little tachycardic and hypertensive but I suspect that this more  likely related to the stress of being the emergency department given she is quite upset and tearful about what is been going on.  I repeated her vital signs and they were much better than when she initially came in. I asked patient what she was most concerned about she was worried about blood clots.  Patient is got 2+ distal pulses so arterial flow appear normal.  There is no redness or evidence of cellulitis.   Will get ultrasound just to make sure there is no evidence of blood clot given patient reporting tightness.  Will get CK level to evaluate for rhabdo.  Patient still able to ambulate around the room and there is no actual  weakness on my examination therefore I have low suspicion for neurological process at this time.  She has normal neuro exam.  She does report a little bit of shortness of breath and chest pain so we will get some cardiac markers, EKG, chest x-ray to evaluate for pneumonia, ACS.  I discussed with patient that given some going on for 2 weeks with her other things that can be causing her symptoms that may not be able to be determined here in the emergency room.  Patient will need follow-up with her primary care doctor and possible referral to other doctors.  I explained to her (that if she feels the  pain is more of the joints that she could have developed arthritis and would need to see a rheumatologist for diagnosis for that.  However explained to patient that we would do a work-up to try to rule out some more of the emergent findings.  Patient is afebrile with normal white count do not think she has an infection.  Her urine has no evidence of UTI  Labs are reassuring.  No evidence of anemia, no evidence of heart attack, no evidence of heart failure, thyroid tests are normal, chest x-ray no evidence of pneumonia.  DVT ultrasounds are negative.  Explained the reassuring work-up with patient.  The patient does have pain she is able to ambulate around the room has not had any falls.  She does report prior +ANA has never had a full work-up for this.  Discussed with patient that this could be arthritis and she can follow-up with her rheumatologist but the more important to follow-up with the primary care doctor to plug her in with the right pupil.   She notes today she still has pains down her legs, on the right side feels in the groin and on the lateral side, and on the left is more in the lower back but to the lateral side.  She feels it diffusely throughout her legs, no one focal area.  Is intermittent, sometimes occurs at rest sometimes when up and about.  She states it feels "tight "all over.  Denies marked swelling in the legs. She has a positive ANA history and was seen by Dr. Jefm Bryant previously, but a follow-up recommended if symptoms were persisting. She denies any 1 joint gets red hot or swollen in the legs.  Denies any numbness or tingling in the lower extremities. She does have a prediabetes history, but no history of diabetes.   Upon review, she was seen in the emergency department in May 2021 for atypical chest pain.  Summarized as follows:  64 year old female with PMH as noted above including history of CAD presents with 2 episodes of atypical, nonexertional chest pain today, now completely resolved.  The patient had a cardiac catheterization most recently in 2019 showing stable lesions with an occluded RCA but good collaterals.  On exam, she is overall very well-appearing.  Her vital signs are normal.  The physical exam is unremarkable.  EKG is nonischemic.  Although the patient's risk of ACS is elevated, overall this presentation is not consistent with cardiac cause.  There is also no clinical evidence for PE, aortic dissection, or vascular etiology given the resolved symptoms and reassuring vital signs  Initial troponin is negative, and basic labs are unremarkable.  We will obtain a repeat troponin and plan for discharge home if negative, which it was.  HTN:  Taking amlodipine and metoprolol without issues;   BP Readings from Last 3 Encounters:  10/25/19 (!) 142/72  10/24/19 (!) 193/107  08/26/19 (!) 147/94    Denies chest pain, palpitations, shortness of breath, increased lower extremity swelling, increased headaches, vision changes  +ANA/Polyarthralgia: Last visit with Dr. Jefm Bryant was 02/2019. Plan after that visit was as follows: Support stockings She will let me know if she has a lot more swelling in the legs. May need to switch from Norvasc to another antihypertensive We could inject knees on down the road if she desires See back 6 months, has not followed up.  Obesity/Prediabetes:  Lab Results  Component Value Date   HGBA1C 6.1 (H) 12/06/2018   HGBA1C 5.8 (H) 01/24/2018  HGBA1C 5.8 (H) 03/18/2017   Lab Results  Component Value Date   LDLCALC 229 (H) 12/06/2018   CREATININE 0.91 08/26/2019   Wt Readings from Last 3 Encounters:  10/25/19 223 lb 12.8 oz (101.5 kg)  10/24/19 197 lb 15.6 oz (89.8 kg)  08/26/19 198 lb (89.8 kg)      HLD: Was seeing cardiology but decided not to return, allergic to statins noted   HTN: Taking metoprolol, amlodipine was stopped previously due to swelling in her legs BP Readings from Last 3 Encounters:  08/26/19 (!) 147/94  01/29/19 123/85  01/17/19 (!) 165/101   Denies chest pain, palpitations, shortness of breath, increased lower extremity swelling,      Patient Active Problem List   Diagnosis Date Noted  . Cellulitis 01/28/2019  . 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 Other specified abnormal immunological findings in serum 09/22/2018  . 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preiser), left ([place]) 09/22/2018  . Tenosynovitis of hand 09/22/2018  . Breast mass, right 06/14/2018  . Hypoxia 05/29/2018  . Acute peptic ulcer of stomach   . Stomach irritation   . Abdominal pain, epigastric   . Acute gastric ulcer due to Helicobacter pylori   . Pulmonary  hypertension ([place]) 03/09/2018  . Unstable angina ([place]) 01/23/2018  . Medication monitoring encounter 03/18/2017  . Colon cancer screening 03/18/2017  . Coronary artery calcification seen on CT scan 12/02/2016  . Aortic atherosclerosis ([place]) 12/02/2016  . Abnormal ankle brachial index (ABI) 12/02/2016  . Prediabetes 12/02/2016  . Fracture of rib 08/28/2016  . Chest pain 08/11/2016  . Hx of tobacco use, presenting hazards to health 06/09/2016  . Degenerative arthritis of knee, bilateral 12/27/2015  . Obesity 12/27/2015  . GI bleed 09/21/2015  . Reflux esophagitis   . Other diseases of stomach and duodenum   . Polyarthralgia 06/05/2015  . Night sweats 06/05/2015  . Headache 04/12/2015  . Subclinical hypothyroidism 03/26/2015  . Herpes simplex type 2 infection 03/21/2015  . Rhinitis, allergic 03/21/2015  . ADHD (attention deficit hyperactivity disorder), inattentive type 09/20/2014  . Episodic paroxysmal anxiety disorder 09/20/2014  . History of gastric ulcer 09/20/2014  . Abuse, drug or alcohol ([place]) 09/20/2014  . HLD (hyperlipidemia) 10/03/2009  . Coronary artery disease involving native coronary artery of native heart with angina pectoris ([place]) 10/02/2009  . Hypertension goal BP (blood pressure) < 140/90 10/02/2009      Current Outpatient Medications:  .  acyclovir (ZOVIRAX) 400 MG tablet, Take 1 tablet (400 mg total) by mouth 2 (two) times daily., Disp: 60 tablet, Rfl: 5 .  Alirocumab (PRALUENT) 75 MG/ML SOAJ, Inject 1 each into the skin every 14 (fourteen) days., Disp: 6 pen, Rfl: 3 .  amphetamine-dextroamphetamine (ADDERALL) 30 MG tablet, Take 30 mg by mouth 2 (two) times daily. , Disp: , Rfl:  .  aspirin EC 81 MG tablet, Take 81 mg by mouth daily., Disp: , Rfl:  .  cholecalciferol (VITAMIN D) 1000 units tablet, Take 1,000 Units by mouth daily., Disp: , Rfl:  .  Cyanocobalamin (VITAMIN B-12 PO), Take by mouth. Gummies, Disp: , Rfl:  .  diazepam (VALIUM) 10 MG tablet, Take 1  tablet (10 mg total) by mouth every 8 (eight) hours as needed for anxiety., Disp: 30 tablet, Rfl: 0 .  fluticasone (FLONASE) 50 MCG/ACT nasal spray, Place 2 sprays into both nostrils daily as needed for allergies., Disp: 16 g, Rfl: 11 .  metoprolol tartrate (LOPRESSOR) 50 MG tablet, TAKE 1  TABLET BY MOUTH TWICE DAILY, Disp: 60 tablet, Rfl: 5 .  nystatin (MYCOSTATIN/NYSTOP) powder, Apply topically 2 (two) times daily. (Patient taking differently: Apply topically as needed (rash). ), Disp: 15 g, Rfl: 0 .  amLODipine (NORVASC) 5 MG tablet, TAKE 1 TABLET(5 MG) BY MOUTH DAILY FOR BLOOD PRESSURE, Disp: 90 tablet, Rfl: 1 .  gabapentin (NEURONTIN) 100 MG capsule, Take 1 capsule (100 mg total) by mouth 3 (three) times daily., Disp: 90 capsule, Rfl: 0 .  nitroGLYCERIN (NITROSTAT) 0.4 MG SL tablet, Place 1 tablet (0.4 mg total) under the tongue every 5 (five) minutes as needed for chest pain. (Patient not taking: Reported on 10/25/2019), Disp: 30 tablet, Rfl: 0 .  Omega-3 Fatty Acids (FISH OIL) 1000 MG CAPS, Take 1 capsule by mouth daily., Disp: , Rfl:  .  venlafaxine XR (EFFEXOR XR) 37.5 MG 24 hr capsule, Take 1 capsule (37.5 mg total) by mouth daily with breakfast., Disp: 90 capsule, Rfl: 1   Allergies  Allergen Reactions  . Amlodipine     Leg swelling     Past Surgical History:  Procedure Laterality Date  . APPENDECTOMY    . CARDIAC CATHETERIZATION    . CESAREAN SECTION     x2  . COLON SURGERY     interception  . ESOPHAGOGASTRODUODENOSCOPY (EGD) WITH PROPOFOL N/A 09/21/2015   Procedure: ESOPHAGOGASTRODUODENOSCOPY (EGD) WITH PROPOFOL;  Surgeon: Lucilla Lame, MD;  Location: [facility] ENDOSCOPY;  Service: Endoscopy;  Laterality: N/A;  . ESOPHAGOGASTRODUODENOSCOPY (EGD) WITH PROPOFOL N/A 07/30/2016   Procedure: ESOPHAGOGASTRODUODENOSCOPY (EGD) WITH PROPOFOL;  Surgeon: Jonathon Bellows, MD;  Location: [facility] ENDOSCOPY;  Service: Endoscopy;  Laterality: N/A;  . ESOPHAGOGASTRODUODENOSCOPY (EGD) WITH PROPOFOL N/A  04/23/2018   Procedure: ESOPHAGOGASTRODUODENOSCOPY (EGD) WITH PROPOFOL;  Surgeon: Virgel Manifold, MD;  Location: [facility] ENDOSCOPY;  Service: Endoscopy;  Laterality: N/A;  . LEFT HEART CATH Right 01/25/2018   Procedure: Left Heart Cath and Coronary Angiography;  Surgeon: Dionisio David, MD;  Location: [place] [facility];  Service: Cardiovascular;  Laterality: Right;  . LEFT HEART CATH AND CORONARY ANGIOGRAPHY Right 08/12/2016   Procedure: Left Heart Cath and Coronary Angiography;  Surgeon: Dionisio David, MD;  Location: [place] [facility];  Service: Cardiovascular;  Laterality: Right;  . TONSILLECTOMY    . TUBAL LIGATION       Family History  Problem Relation Age of Onset  . Diabetes Mother   . CVA Mother   . Heart disease Father   . Heart disease Sister   . Breast cancer Sister   . Heart disease Brother   . Heart disease Sister   . Heart disease Sister   . Heart disease Sister   . Heart disease Brother   . Heart disease Brother   . Heart disease Brother   . Heart disease Brother   . Healthy Daughter   . Healthy Daughter      Social History   Tobacco Use  . Smoking status: Former Smoker    Packs/day: 1.00    Years: 30.00    Pack years: 30.00    Types: Cigarettes    Quit date: 2006    Years since quitting: 15.5  . Smokeless tobacco: Never Used  Substance Use Topics  . Alcohol use: Yes    With staff assistance, above reviewed with the patient today.  ROS: As per HPI, otherwise no specific complaints on a limited and focused system review   Results for orders placed or performed during the hospital encounter of 10/24/19 (from  the past 72 hour(s))  Troponin I (High Sensitivity)     Status: None   Collection Time: 10/24/19  9:46 AM  Result Value Ref Range   Troponin I (High Sensitivity) 5 <18 ng/L    Comment: (NOTE) Elevated high sensitivity troponin I (hsTnI) values and significant  changes across serial measurements may suggest ACS but many other  chronic  and acute conditions are known to elevate hsTnI results.  Refer to the "Links" section for chest pain algorithms and additional  guidance. Performed at [facility], [address] [place], [place] [address]   Brain natriuretic peptide     Status: None   Collection Time: 10/24/19  9:46 AM  Result Value Ref Range   B Natriuretic Peptide 45.7 0.0 - 100.0 pg/mL    Comment: Performed at [facility], [address] [place], [place] [address]  CK     Status: None   Collection Time: 10/24/19  9:46 AM  Result Value Ref Range   Total CK 92 38.0 - 234.0 U/L    Comment: Performed at [facility], [address] [place], [place] [address]  TSH     Status: None   Collection Time: 10/24/19  9:46 AM  Result Value Ref Range   TSH 2.631 0.350 - 4.500 uIU/mL    Comment: Performed by a 3rd Generation assay with a functional sensitivity of <=0.01 uIU/mL. Performed at [facility], [address] [place], [place] [address]   T4, free     Status: None   Collection Time: 10/24/19  9:46 AM  Result Value Ref Range   Free T4 0.81 0.61 - 1.12 ng/dL    Comment: (NOTE) Biotin ingestion may interfere with free T4 tests. If the results are inconsistent with the TSH level, previous test results, or the clinical presentation, then consider biotin interference. If needed, order repeat testing after stopping biotin. Performed at [facility], [address] [place], [place] [address]   CBC with Differential     Status: None   Collection Time: 10/24/19 10:12 AM  Result Value Ref Range   WBC 7.4 4.0 - 10.5 K/uL   RBC 4.17 3.87 - 5.11 MIL/uL   Hemoglobin 13.3 12.0 - 15.0 g/dL   HCT 39.4 36 - 46 %   MCV 94.5 80.0 - 100.0 fL   MCH 31.9 26.0 - 34.0 pg   MCHC 33.8 30.0 - 36.0 g/dL   RDW 13.3 11.5 - 15.5 %   Platelets 324 150 - 400 K/uL   nRBC 0.0 0.0 - 0.2 %   Neutrophils Relative % 44 %   Neutro Abs 3.4 1.7 - 7.7 K/uL   Lymphocytes Relative 41 %    Lymphs Abs 3.1 0.7 - 4.0 K/uL   Monocytes Relative 10 %   Monocytes Absolute 0.7 0 - 1 K/uL   Eosinophils Relative 3 %   Eosinophils Absolute 0.2 0 - 0 K/uL   Basophils Relative 1 %   Basophils Absolute 0.1 0 - 0 K/uL   Immature Granulocytes 1 %   Abs Immature Granulocytes 0.04 0.00 - 0.07 K/uL    Comment: Performed at [facility], [address] [place], [place] [address]  Comprehensive metabolic panel     Status: Abnormal   Collection Time: 10/24/19 10:12 AM  Result Value Ref Range   Sodium 137 135 - 145 mmol/L   Potassium 3.8 3.5 - 5.1 mmol/L   Chloride 102 98 - 111 mmol/L   CO2  25 22 - 32 mmol/L   Glucose, Bld 108 (H) 70 - 99 mg/dL    Comment: Glucose reference range applies only to samples taken after fasting for at least 8 hours.   BUN 14 8 - 23 mg/dL   Creatinine, Ser 0.62 0.44 - 1.00 mg/dL   Calcium 8.9 8.9 - 10.3 mg/dL   Total Protein 7.7 6.5 - 8.1 g/dL   Albumin 4.2 3.5 - 5.0 g/dL   AST 21 15 - 41 U/L   ALT 16 0 - 44 U/L   Alkaline Phosphatase 76 38 - 126 U/L   Total Bilirubin 0.5 0.3 - 1.2 mg/dL   GFR calc non Af Amer >60 >60 mL/min   GFR calc Af Amer >60 >60 mL/min   Anion gap 10 5 - 15    Comment: Performed at [facility], [address] [place], [place] [address]  Urinalysis, Complete w Microscopic     Status: Abnormal   Collection Time: 10/24/19 12:36 PM  Result Value Ref Range   Color, Urine YELLOW (A) YELLOW   APPearance CLEAR (A) CLEAR   Specific Gravity, Urine 1.015 1.005 - 1.030   pH 5.0 5.0 - 8.0   Glucose, UA NEGATIVE NEGATIVE mg/dL   Hgb urine dipstick NEGATIVE NEGATIVE   Bilirubin Urine NEGATIVE NEGATIVE   Ketones, ur NEGATIVE NEGATIVE mg/dL   Protein, ur NEGATIVE NEGATIVE mg/dL   Nitrite NEGATIVE NEGATIVE   Leukocytes,Ua NEGATIVE NEGATIVE   RBC / HPF 0-5 0 - 5 RBC/hpf   WBC, UA 0-5 0 - 5 WBC/hpf   Bacteria, UA NONE SEEN NONE SEEN   Squamous Epithelial / LPF NONE SEEN 0 - 5   Mucus PRESENT     Comment: Performed at  [facility], [address] [place], [place] [address]     PHQ2/9: Depression screen [facility] 2/9 10/25/2019 09/23/2019 05/27/2019 02/10/2019 12/06/2018  Decreased Interest 0 0 0 0 3  Down, Depressed, Hopeless 0 1 0 0 3  PHQ - 2 Score 0 1 0 0 6  Altered sleeping 0 - - 0 3  Tired, decreased energy 0 - - 0 3  Change in appetite 0 - - 0 3  Feeling bad or failure about yourself  0 - - 0 3  Trouble concentrating 0 - - 0 1  Moving slowly or fidgety/restless 0 - - 0 0  Suicidal thoughts 0 - - 0 0  PHQ-9 Score 0 - - 0 19  Difficult doing work/chores Not difficult at all - - Not difficult at all Somewhat difficult  Some recent data might be hidden   PHQ-2/9 Result is neg  Fall Risk: Fall Risk  10/25/2019 05/27/2019 02/10/2019 12/16/2018 12/06/2018  Falls in the past year? 0 0 0 0 0  Number falls in past yr: 0 - 0 0 0  Injury with Fall? 0 - 0 0 0  Comment - - - - -  Risk for fall due to : - Medication side effect;Other (Comment) - - -  Risk for fall due to: Comment - History of Joint Pain - - -  Follow up - Falls prevention discussed Falls evaluation completed Falls prevention discussed Falls evaluation completed      Objective:   Vitals:   10/25/19 1436  BP: (!) 142/72  Pulse: 71  Resp: 16  Temp: 97.6 F (36.4 C)  TempSrc: Temporal  SpO2: 99%  Weight: 223 lb 12.8 oz (101.5 kg)  Height: 5\' 5"  (1.651 m)    Body  mass index is 37.24 kg/m.  Physical Exam   NAD, masked HEENT - [place]/AT, sclera anicteric, PERRL, EOMI, conj - non-inj'ed, pharynx clear Neck - supple, no adenopathy, no TM,  Car - RRR without m/g/r Pulm- RR and effort normal at rest, CTA without wheeze or rales Abd - soft, obese, NT diffusely, ND, BS+,  no obvious masses Back - no CVA tenderness, straight leg raise was negative Ext - no LE edema, no active joints, a small indentation was present in the right quad above the knee joint, with no overlying skin changes such as erythema or bruising, not tender to palpate  this area The knee joints had evidence of mild chronic deformities bilaterally consistent with OA, no range of motion deficits, no increased erythema or warmth or effusions, Neuro/psychiatric - affect was not flat, appropriate with conversation  Alert   Grossly non-focal - good strength on testing extremities, sensation intact to LT In ext's, DTRs were 1+ and equal in the patella bilaterally  Speech and gait are normal   Results for orders placed or performed during the hospital encounter of 10/24/19  CBC with Differential  Result Value Ref Range   WBC 7.4 4.0 - 10.5 K/uL   RBC 4.17 3.87 - 5.11 MIL/uL   Hemoglobin 13.3 12.0 - 15.0 g/dL   HCT 39.4 36 - 46 %   MCV 94.5 80.0 - 100.0 fL   MCH 31.9 26.0 - 34.0 pg   MCHC 33.8 30.0 - 36.0 g/dL   RDW 13.3 11.5 - 15.5 %   Platelets 324 150 - 400 K/uL   nRBC 0.0 0.0 - 0.2 %   Neutrophils Relative % 44 %   Neutro Abs 3.4 1.7 - 7.7 K/uL   Lymphocytes Relative 41 %   Lymphs Abs 3.1 0.7 - 4.0 K/uL   Monocytes Relative 10 %   Monocytes Absolute 0.7 0 - 1 K/uL   Eosinophils Relative 3 %   Eosinophils Absolute 0.2 0 - 0 K/uL   Basophils Relative 1 %   Basophils Absolute 0.1 0 - 0 K/uL   Immature Granulocytes 1 %   Abs Immature Granulocytes 0.04 0.00 - 0.07 K/uL  Comprehensive metabolic panel  Result Value Ref Range   Sodium 137 135 - 145 mmol/L   Potassium 3.8 3.5 - 5.1 mmol/L   Chloride 102 98 - 111 mmol/L   CO2 25 22 - 32 mmol/L   Glucose, Bld 108 (H) 70 - 99 mg/dL   BUN 14 8 - 23 mg/dL   Creatinine, Ser 0.62 0.44 - 1.00 mg/dL   Calcium 8.9 8.9 - 10.3 mg/dL   Total Protein 7.7 6.5 - 8.1 g/dL   Albumin 4.2 3.5 - 5.0 g/dL   AST 21 15 - 41 U/L   ALT 16 0 - 44 U/L   Alkaline Phosphatase 76 38 - 126 U/L   Total Bilirubin 0.5 0.3 - 1.2 mg/dL   GFR calc non Af Amer >60 >60 mL/min   GFR calc Af Amer >60 >60 mL/min   Anion gap 10 5 - 15  Brain natriuretic peptide  Result Value Ref Range   B Natriuretic Peptide 45.7 0.0 - 100.0 pg/mL  CK   Result Value Ref Range   Total CK 92 38.0 - 234.0 U/L  Urinalysis, Complete w Microscopic  Result Value Ref Range   Color, Urine YELLOW (A) YELLOW   APPearance CLEAR (A) CLEAR   Specific Gravity, Urine 1.015 1.005 - 1.030   pH 5.0 5.0 - 8.0  Glucose, UA NEGATIVE NEGATIVE mg/dL   Hgb urine dipstick NEGATIVE NEGATIVE   Bilirubin Urine NEGATIVE NEGATIVE   Ketones, ur NEGATIVE NEGATIVE mg/dL   Protein, ur NEGATIVE NEGATIVE mg/dL   Nitrite NEGATIVE NEGATIVE   Leukocytes,Ua NEGATIVE NEGATIVE   RBC / HPF 0-5 0 - 5 RBC/hpf   WBC, UA 0-5 0 - 5 WBC/hpf   Bacteria, UA NONE SEEN NONE SEEN   Squamous Epithelial / LPF NONE SEEN 0 - 5   Mucus PRESENT   TSH  Result Value Ref Range   TSH 2.631 0.350 - 4.500 uIU/mL  T4, free  Result Value Ref Range   Free T4 0.81 0.61 - 1.12 ng/dL  Troponin I (High Sensitivity)  Result Value Ref Range   Troponin I (High Sensitivity) 5 <18 ng/L       Assessment & Plan:   1. Pain in both lower extremities Patient notes has been present for a couple weeks now, with the work-up in the emergency room without obvious findings as to source, ultrasound negative for clot.  No marked swelling of the lower extremities noted on exam today.  Exact source unclear.  May have a rheumatic component with a positive ANA noted.  Does have arthritic issues of her knees bilaterally, and wonder if that is a contributing factor.  Does seem more of an arthralgia component. Did feel having rheumatology input was helpful, and a referral given.  She may want to see a different provider than previously noted. We will hold off on ordering further lab tests as await their input. Did note the significant weight gain over the past year. - Ambulatory referral to Rheumatology  2. ANA positive As noted above. - Ambulatory referral to Rheumatology  3. Polyarthralgia As noted above.  Did mention entities like fibromyalgia in the differential.  4. Hypertension goal BP (blood pressure) <  140/90 Her blood pressure was higher in the emergency room yesterday, and just slightly higher systolic noted on assessment today.  Do feel pain can increase blood pressure and probably a contributor.  Also the stressors of the visits could be possible contributors. We will not add another medicine at this point, and continue to monitor.  5. Prediabetes She has never been diabetic, but prediabetic.    Felt best to get rheumatology input, and then follow-up after and await their input.      Towanda Malkin, MD 10/25/19 3:16 PM

## 2019-10-24 ENCOUNTER — Emergency Department
Admission: EM | Admit: 2019-10-24 | Discharge: 2019-10-24 | Disposition: A | Discharge status: Home / Self Care | Payer: Medicare Other | Attending: Emergency Medicine | Admitting: Emergency Medicine

## 2019-10-24 ENCOUNTER — Emergency Department: Payer: Medicare Other

## 2019-10-24 ENCOUNTER — Other Ambulatory Visit: Payer: Self-pay

## 2019-10-24 ENCOUNTER — Encounter: Payer: Self-pay | Admitting: Emergency Medicine

## 2019-10-24 DIAGNOSIS — M79652 Pain in left thigh: Secondary | ICD-10-CM | POA: Insufficient documentation

## 2019-10-24 DIAGNOSIS — Z85038 Personal history of other malignant neoplasm of large intestine: Secondary | ICD-10-CM | POA: Diagnosis not present

## 2019-10-24 DIAGNOSIS — M79651 Pain in right thigh: Secondary | ICD-10-CM | POA: Diagnosis not present

## 2019-10-24 DIAGNOSIS — I10 Essential (primary) hypertension: Secondary | ICD-10-CM | POA: Diagnosis not present

## 2019-10-24 DIAGNOSIS — R0602 Shortness of breath: Secondary | ICD-10-CM | POA: Insufficient documentation

## 2019-10-24 DIAGNOSIS — E039 Hypothyroidism, unspecified: Secondary | ICD-10-CM | POA: Insufficient documentation

## 2019-10-24 DIAGNOSIS — R079 Chest pain, unspecified: Secondary | ICD-10-CM | POA: Diagnosis not present

## 2019-10-24 DIAGNOSIS — M79605 Pain in left leg: Secondary | ICD-10-CM

## 2019-10-24 DIAGNOSIS — Z79899 Other long term (current) drug therapy: Secondary | ICD-10-CM | POA: Diagnosis not present

## 2019-10-24 DIAGNOSIS — R531 Weakness: Secondary | ICD-10-CM | POA: Diagnosis not present

## 2019-10-24 DIAGNOSIS — Z87891 Personal history of nicotine dependence: Secondary | ICD-10-CM | POA: Insufficient documentation

## 2019-10-24 DIAGNOSIS — I2510 Atherosclerotic heart disease of native coronary artery without angina pectoris: Secondary | ICD-10-CM | POA: Diagnosis not present

## 2019-10-24 DIAGNOSIS — M79604 Pain in right leg: Secondary | ICD-10-CM

## 2019-10-24 LAB — URINALYSIS, COMPLETE (UACMP) WITH MICROSCOPIC
Bacteria, UA: NONE SEEN
Bilirubin Urine: NEGATIVE
Glucose, UA: NEGATIVE mg/dL
Hgb urine dipstick: NEGATIVE
Ketones, ur: NEGATIVE mg/dL
Leukocytes,Ua: NEGATIVE
Nitrite: NEGATIVE
Protein, ur: NEGATIVE mg/dL
Specific Gravity, Urine: 1.015 (ref 1.005–1.030)
Squamous Epithelial / HPF: NONE SEEN (ref 0–5)
pH: 5 (ref 5.0–8.0)

## 2019-10-24 LAB — COMPREHENSIVE METABOLIC PANEL
ALT: 16 U/L (ref 0–44)
AST: 21 U/L (ref 15–41)
Albumin: 4.2 g/dL (ref 3.5–5.0)
Alkaline Phosphatase: 76 U/L (ref 38–126)
Anion gap: 10 (ref 5–15)
BUN: 14 mg/dL (ref 8–23)
CO2: 25 mmol/L (ref 22–32)
Calcium: 8.9 mg/dL (ref 8.9–10.3)
Chloride: 102 mmol/L (ref 98–111)
Creatinine, Ser: 0.62 mg/dL (ref 0.44–1.00)
GFR calc Af Amer: >60 mL/min (ref >60)
GFR calc non Af Amer: >60 mL/min (ref >60)
Glucose, Bld: 108 mg/dL — ABNORMAL HIGH (ref 70–99)
Potassium: 3.8 mmol/L (ref 3.5–5.1)
Sodium: 137 mmol/L (ref 135–145)
Total Bilirubin: 0.5 mg/dL (ref 0.3–1.2)
Total Protein: 7.7 g/dL (ref 6.5–8.1)

## 2019-10-24 LAB — CBC WITH DIFFERENTIAL/PLATELET
Abs Immature Granulocytes: 0.04 10*3/uL (ref 0.00–0.07)
Basophils Absolute: 0.1 10*3/uL (ref 0.0–0.1)
Basophils Relative: 1 %
Eosinophils Absolute: 0.2 10*3/uL (ref 0.0–0.5)
Eosinophils Relative: 3 %
HCT: 39.4 % (ref 36.0–46.0)
Hemoglobin: 13.3 g/dL (ref 12.0–15.0)
Immature Granulocytes: 1 %
Lymphocytes Relative: 41 %
Lymphs Abs: 3.1 10*3/uL (ref 0.7–4.0)
MCH: 31.9 pg (ref 26.0–34.0)
MCHC: 33.8 g/dL (ref 30.0–36.0)
MCV: 94.5 fL (ref 80.0–100.0)
Monocytes Absolute: 0.7 10*3/uL (ref 0.1–1.0)
Monocytes Relative: 10 %
Neutro Abs: 3.4 10*3/uL (ref 1.7–7.7)
Neutrophils Relative %: 44 %
Platelets: 324 10*3/uL (ref 150–400)
RBC: 4.17 MIL/uL (ref 3.87–5.11)
RDW: 13.3 % (ref 11.5–15.5)
WBC: 7.4 10*3/uL (ref 4.0–10.5)
nRBC: 0 % (ref 0.0–0.2)

## 2019-10-24 LAB — TSH: TSH: 2.631 u[IU]/mL (ref 0.350–4.500)

## 2019-10-24 LAB — T4, FREE: Free T4: 0.81 ng/dL (ref 0.61–1.12)

## 2019-10-24 LAB — CK: Total CK: 92 U/L (ref 38–234)

## 2019-10-24 LAB — TROPONIN I (HIGH SENSITIVITY): Troponin I (High Sensitivity): 5 ng/L (ref <18)

## 2019-10-24 LAB — BRAIN NATRIURETIC PEPTIDE: B Natriuretic Peptide: 45.7 pg/mL (ref 0.0–100.0)

## 2019-10-24 MED ORDER — ACETAMINOPHEN 500 MG PO TABS
1000.0000 mg | ORAL_TABLET | Freq: Once | ORAL | Status: AC
  Administered 2019-10-24: 1000 mg via ORAL
  Filled 2019-10-24: qty 2

## 2019-10-24 MED ORDER — KETOROLAC TROMETHAMINE 30 MG/ML IJ SOLN
30.0000 mg | Freq: Once | INTRAMUSCULAR | Status: AC
  Administered 2019-10-24: 30 mg via INTRAMUSCULAR
  Filled 2019-10-24: qty 1

## 2019-10-24 NOTE — Discharge Instructions (Signed)
Your work-up was reassuring.  There is no evidence of blood clots in your legs and there is no signs of heart attack.  Your blood pressure was slightly elevated and this can be rechecked with your primary care doctor because could be due to stress of coming to the emergency room since you are not normally elevated. Please discuss further with your primary doctor. You may need to see a rheumatologist because you may need further work-up for arthritis.  Return the ER if you develop worsening pain or any other concerns.  You should take Tylenol 1 g every 8 hours to help with your pain

## 2019-10-24 NOTE — ED Notes (Signed)
Pt transported for xray.

## 2019-10-24 NOTE — ED Triage Notes (Signed)
C/O two week history of generilzed pain, worse to bilateral legs.  Pain started on feet.  States everything hurts, worse with walking.   AAOx3.  Skin warm and dry. NAD

## 2019-10-24 NOTE — ED Provider Notes (Signed)
[facility] Emergency Department Provider Note  ____________________________________________   First MD Initiated Contact with Patient 10/24/19 1159     (approximate)  I have reviewed the triage vital signs and the nursing notes.   HISTORY  Chief Complaint Leg pain  HPI Linda Mejia is a 64 y.o. female with pulmonary hypertension, CAD who comes in for generalized weakness.  Patient states that her biggest concern is the pain in her legs.  Patient reports over the past 2 weeks she has had worsening pain in her thighs.  She says it feels like a tightness, severe, constant, not better with Tylenol, nothing makes it worse.  The pain causes a heavy sensation in her legs where she does not want to get up out of bed and walk.  She reports occasionally having some lower back pain that radiates down the back of her leg but she had sciatica before.  She does have some intermittent stabbing sensation in her chest but states that she is less worried about that.  A little bit of shortness of breath as well.  She does report having some anxiety at baseline but does not think that that is causing this.  She denies any urinary symptoms.  On review of systems she also reports some dry eyes, dry nose.  When asked patient what she was most worried about she was worried about a blood clot in her legs the possibility having infection her blood.  She denies any tick bites.          Past Medical History:  Diagnosis Date  . Abnormal ankle brachial index (ABI) 12/02/2016  . ADHD (attention deficit hyperactivity disorder)   . Anxiety   . Aortic atherosclerosis ([place]) 12/02/2016   July 2018  . Chronic back pain   . Coronary artery disease   . Herpes genitalis in women   . History of stomach ulcers    x7 this year. Bleeding  . Hypertension   . Prediabetes 12/02/2016  . Pulmonary hypertension ([place]) 03/09/2018   Echo Oct 2019    Patient Active Problem List   Diagnosis Date Noted  .  Cellulitis 01/28/2019  . 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 Other specified abnormal immunological findings in serum 09/22/2018  . 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preiser), left ([place]) 09/22/2018  . Tenosynovitis of hand 09/22/2018  . Breast mass, right 06/14/2018  . Hypoxia 05/29/2018  . Acute peptic ulcer of stomach   . Stomach irritation   . Abdominal pain, epigastric   . Acute gastric ulcer due to Helicobacter pylori   . Pulmonary hypertension ([place]) 03/09/2018  . Unstable angina ([place]) 01/23/2018  . Medication monitoring encounter 03/18/2017  . Colon cancer screening 03/18/2017  . Coronary artery calcification seen on CT scan 12/02/2016  . Aortic atherosclerosis ([place]) 12/02/2016  . Abnormal ankle brachial index (ABI) 12/02/2016  . Prediabetes 12/02/2016  . Fracture of rib 08/28/2016  . Chest pain 08/11/2016  . Hx of tobacco use, presenting hazards to health 06/09/2016  . Degenerative arthritis of knee, bilateral 12/27/2015  . Obesity 12/27/2015  . GI bleed 09/21/2015  . Reflux esophagitis   . Other diseases of stomach and duodenum   . Polyarthralgia 06/05/2015  . Night sweats 06/05/2015  . Headache 04/12/2015  . Subclinical hypothyroidism 03/26/2015  . Herpes simplex type 2 infection 03/21/2015  . Rhinitis, allergic 03/21/2015  . ADHD (attention deficit hyperactivity disorder), inattentive type 09/20/2014  . Episodic paroxysmal anxiety disorder 09/20/2014  .  History of gastric ulcer 09/20/2014  . Abuse, drug or alcohol ([place]) 09/20/2014  . HLD (hyperlipidemia) 10/03/2009  . Coronary artery disease involving native coronary artery of native heart with angina pectoris ([place]) 10/02/2009  . Hypertension goal BP (blood pressure) < 140/90 10/02/2009    Past Surgical History:  Procedure Laterality Date  . APPENDECTOMY    . CARDIAC CATHETERIZATION    . CESAREAN SECTION     x2  . COLON SURGERY     interception  . ESOPHAGOGASTRODUODENOSCOPY (EGD)  WITH PROPOFOL N/A 09/21/2015   Procedure: ESOPHAGOGASTRODUODENOSCOPY (EGD) WITH PROPOFOL;  Surgeon: Lucilla Lame, MD;  Location: [facility] ENDOSCOPY;  Service: Endoscopy;  Laterality: N/A;  . ESOPHAGOGASTRODUODENOSCOPY (EGD) WITH PROPOFOL N/A 07/30/2016   Procedure: ESOPHAGOGASTRODUODENOSCOPY (EGD) WITH PROPOFOL;  Surgeon: Jonathon Bellows, MD;  Location: [facility] ENDOSCOPY;  Service: Endoscopy;  Laterality: N/A;  . ESOPHAGOGASTRODUODENOSCOPY (EGD) WITH PROPOFOL N/A 04/23/2018   Procedure: ESOPHAGOGASTRODUODENOSCOPY (EGD) WITH PROPOFOL;  Surgeon: Virgel Manifold, MD;  Location: [facility] ENDOSCOPY;  Service: Endoscopy;  Laterality: N/A;  . LEFT HEART CATH Right 01/25/2018   Procedure: Left Heart Cath and Coronary Angiography;  Surgeon: Dionisio David, MD;  Location: [place] CV [facility];  Service: Cardiovascular;  Laterality: Right;  . LEFT HEART CATH AND CORONARY ANGIOGRAPHY Right 08/12/2016   Procedure: Left Heart Cath and Coronary Angiography;  Surgeon: Dionisio David, MD;  Location: [place] [facility];  Service: Cardiovascular;  Laterality: Right;  . TONSILLECTOMY    . TUBAL LIGATION      Prior to Admission medications   Medication Sig Start Date End Date Taking? Authorizing Provider  acyclovir (ZOVIRAX) 400 MG tablet Take 1 tablet (400 mg total) by mouth 2 (two) times daily. 02/15/19   Hubbard Hartshorn, FNP  Alirocumab (PRALUENT) 75 MG/ML SOAJ Inject 1 each into the skin every 14 (fourteen) days. 12/09/18   Hubbard Hartshorn, FNP  amLODipine (NORVASC) 5 MG tablet TAKE 1 TABLET(5 MG) BY MOUTH DAILY FOR BLOOD PRESSURE 04/26/19   Hubbard Hartshorn, FNP  amphetamine-dextroamphetamine (ADDERALL) 30 MG tablet Take 30 mg by mouth 2 (two) times daily.  12/02/16   Arnetha Courser, MD  aspirin EC 81 MG tablet Take 81 mg by mouth daily.    [provider]  cholecalciferol (VITAMIN D) 1000 units tablet Take 1,000 Units by mouth daily.    [provider]  diazepam (VALIUM) 10 MG tablet Take 1 tablet (10 mg total)  by mouth every 8 (eight) hours as needed for anxiety. 09/22/15   Fritzi Mandes, MD  fluticasone (FLONASE) 50 MCG/ACT nasal spray Place 2 sprays into both nostrils daily as needed for allergies. 02/08/19   Hubbard Hartshorn, FNP  gabapentin (NEURONTIN) 100 MG capsule Take 1 capsule (100 mg total) by mouth 3 (three) times daily. 01/29/19   Dustin Flock, MD  metoprolol tartrate (LOPRESSOR) 50 MG tablet TAKE 1 TABLET BY MOUTH TWICE DAILY 08/07/19   Hubbard Hartshorn, FNP  nitroGLYCERIN (NITROSTAT) 0.4 MG SL tablet Place 1 tablet (0.4 mg total) under the tongue every 5 (five) minutes as needed for chest pain. 01/26/18   Bettey Costa, MD  nystatin (MYCOSTATIN/NYSTOP) powder Apply topically 2 (two) times daily. Patient taking differently: Apply topically as needed (rash).  05/25/18   Dustin Flock, MD  Omega-3 Fatty Acids (FISH OIL) 1000 MG CAPS Take 1 capsule by mouth daily.    [provider]  venlafaxine XR (EFFEXOR XR) 37.5 MG 24 hr capsule Take 1 capsule (37.5 mg total) by  mouth daily with breakfast. 02/10/19   Hubbard Hartshorn, FNP    Allergies Amlodipine  Family History  Problem Relation Age of Onset  . Diabetes Mother   . CVA Mother   . Heart disease Father   . Heart disease Sister   . Breast cancer Sister   . Heart disease Brother   . Heart disease Sister   . Heart disease Sister   . Heart disease Sister   . Heart disease Brother   . Heart disease Brother   . Heart disease Brother   . Heart disease Brother   . Healthy Daughter   . Healthy Daughter     Social History Social History   Tobacco Use  . Smoking status: Former Smoker    Packs/day: 1.00    Years: 30.00    Pack years: 30.00    Types: Cigarettes    Quit date: 2006    Years since quitting: 15.5  . Smokeless tobacco: Never Used  Vaping Use  . Vaping Use: Never used  Substance Use Topics  . Alcohol use: Yes  . Drug use: No      Review of Systems Constitutional: No fever/chills Eyes: No visual changes.   Dry eyes ENT: No sore throat.  Dry nose Cardiovascular: Positive chest pain Respiratory: Positive shortness of breath Gastrointestinal: No abdominal pain.  No nausea, no vomiting.  No diarrhea.  No constipation. Genitourinary: Negative for dysuria. Musculoskeletal: Negative for back pain.  Leg pain bilaterally Skin: Negative for rash. Neurological: Baseline headaches, no focal weakness or numbness. All other ROS negative ____________________________________________   PHYSICAL EXAM:  VITAL SIGNS: ED Triage Vitals  Enc Vitals Group     BP 10/24/19 1007 (!) 180/117     Pulse Rate 10/24/19 1007 (!) 107     Resp 10/24/19 1007 18     Temp 10/24/19 1007 97.9 F (36.6 C)     Temp Source 10/24/19 1007 Oral     SpO2 10/24/19 1007 95 %     Weight 10/24/19 1008 197 lb 15.6 oz (89.8 kg)     Height 10/24/19 1008 5\' 5"  (1.651 m)     Head Circumference --      Peak Flow --      Pain Score 10/24/19 1008 8     Pain Loc --      Pain Edu? --      Excl. in [place]? --     Constitutional: Alert and oriented. Well appearing but tearful Eyes: Conjunctivae are normal. EOMI. Head: Atraumatic. Nose: No congestion/rhinnorhea. Mouth/Throat: Mucous membranes are moist.   Neck: No stridor. Trachea Midline. FROM Cardiovascular: Normal rate, regular rhythm. Grossly normal heart sounds.  Good peripheral circulation. Respiratory: Normal respiratory effort.  No retractions. Lungs CTAB. Gastrointestinal: Soft and nontender. No distention. No abdominal bruits.  Musculoskeletal: No lower extremity tenderness nor edema.  No joint effusions.  5 out of 5 leg strength.  No rash noted.  2+ distal pulses. Neurologic:  Normal speech and language. No gross focal neurologic deficits are appreciated.  Cranial nerves appear intact with equal strength in arms and legs.  Patient is able to ambulate around the room with a normal gait although she reports pain Skin:  Skin is warm, dry and intact. No rash noted. Psychiatric:  Mood and affect are normal. Speech and behavior are normal.  Patient is tearful GU: Deferred   ____________________________________________   LABS (all labs ordered are listed, but only abnormal results are displayed)  Labs Reviewed  COMPREHENSIVE  METABOLIC PANEL - Abnormal; Notable for the following components:      Result Value   Glucose, Bld 108 (*)    All other components within normal limits  URINALYSIS, COMPLETE (UACMP) WITH MICROSCOPIC - Abnormal; Notable for the following components:   Color, Urine YELLOW (*)    APPearance CLEAR (*)    All other components within normal limits  CBC WITH DIFFERENTIAL/PLATELET  BRAIN NATRIURETIC PEPTIDE  CK  TSH  T4, FREE  TROPONIN I (HIGH SENSITIVITY)   ____________________________________________   ED ECG REPORT I, Vanessa Carrizales, the attending physician, personally viewed and interpreted this ECG.  Normal sinus rate of 90, no ST ovation, no T wave inversions, normal intervals ____________________________________________  RADIOLOGY Robert Bellow, personally viewed and evaluated these images (plain radiographs) as part of my medical decision making, as well as reviewing the written report by the radiologist.  ED MD interpretation: No pneumonia  Official radiology report(s): DG Chest 2 View  Result Date: 10/24/2019 CLINICAL DATA:  Shortness of breath over the last 2 weeks. EXAM: CHEST - 2 VIEW COMPARISON:  08/26/2019 FINDINGS: Heart size is normal. Coronary artery calcification and stent. Aortic atherosclerotic calcification. Pulmonary vascularity is normal. The lungs are clear. No acute bone finding. Ordinary thoracic degenerative changes. Probable loose body left shoulder. IMPRESSION: No active disease. Aortic atherosclerosis. Coronary artery calcification and stent. Electronically Signed   By: Nelson Chimes M.D.   On: 10/24/2019 12:57   US Venous Img Lower Bilateral  Result Date: 10/24/2019 CLINICAL DATA:  Bilateral lower  extremity pain. EXAM: BILATERAL LOWER EXTREMITY VENOUS DOPPLER ULTRASOUND TECHNIQUE: Gray-scale sonography with graded compression, as well as color Doppler and duplex ultrasound were performed to evaluate the lower extremity deep venous systems from the level of the common femoral vein and including the common femoral, femoral, profunda femoral, popliteal and calf veins including the posterior tibial, peroneal and gastrocnemius veins when visible. The superficial great saphenous vein was also interrogated. Spectral Doppler was utilized to evaluate flow at rest and with distal augmentation maneuvers in the common femoral, femoral and popliteal veins. COMPARISON:  None. FINDINGS: RIGHT LOWER EXTREMITY Common Femoral Vein: No evidence of thrombus. Normal compressibility, respiratory phasicity and response to augmentation. Saphenofemoral Junction: No evidence of thrombus. Normal compressibility and flow on color Doppler imaging. Profunda Femoral Vein: No evidence of thrombus. Normal compressibility and flow on color Doppler imaging. Femoral Vein: No evidence of thrombus. Normal compressibility, respiratory phasicity and response to augmentation. Popliteal Vein: No evidence of thrombus. Normal compressibility, respiratory phasicity and response to augmentation. Calf Veins: No evidence of thrombus. Normal compressibility and flow on color Doppler imaging. Superficial Great Saphenous Vein: No evidence of thrombus. Normal compressibility. Venous Reflux:  None. Other Findings: No evidence of superficial thrombophlebitis or abnormal fluid collection. LEFT LOWER EXTREMITY Common Femoral Vein: No evidence of thrombus. Normal compressibility, respiratory phasicity and response to augmentation. Saphenofemoral Junction: No evidence of thrombus. Normal compressibility and flow on color Doppler imaging. Profunda Femoral Vein: No evidence of thrombus. Normal compressibility and flow on color Doppler imaging. Femoral Vein: No  evidence of thrombus. Normal compressibility, respiratory phasicity and response to augmentation. Popliteal Vein: No evidence of thrombus. Normal compressibility, respiratory phasicity and response to augmentation. Calf Veins: No evidence of thrombus. Normal compressibility and flow on color Doppler imaging. Superficial Great Saphenous Vein: No evidence of thrombus. Normal compressibility. Venous Reflux:  None. Other Findings: No evidence of superficial thrombophlebitis or abnormal fluid collection. IMPRESSION: No evidence of deep venous thrombosis in  either lower extremity. Electronically Signed   By: Aletta Edouard M.D.   On: 10/24/2019 13:40    ____________________________________________   PROCEDURES  Procedure(s) performed (including Critical Care):  Procedures   ____________________________________________   INITIAL IMPRESSION / ASSESSMENT AND PLAN / ED COURSE  Linda Mejia was evaluated in Emergency Department on 10/24/2019 for the symptoms described in the history of present illness. She was evaluated in the context of the global COVID-19 pandemic, which necessitated consideration that the patient might be at risk for infection with the SARS-CoV-2 virus that causes COVID-19. Institutional protocols and algorithms that pertain to the evaluation of patients at risk for COVID-19 are in a state of rapid change based on information released by regulatory bodies including the CDC and federal and state organizations. These policies and algorithms were followed during the patient's care in the ED.    Patient is a 64 year old with multiple concerns but her biggest 1 being bilateral leg pain.  Patient initially little tachycardic and hypertensive but I suspect that this more likely related to the stress of being the emergency department given she is quite upset and tearful about what is been going on.  I repeated her vital signs and they were much better than when she initially came in. I asked  patient what she was most concerned about she was worried about blood clots.  Patient is got 2+ distal pulses so arterial flow appear normal.  There is no redness or evidence of cellulitis.  Will get ultrasound just to make sure there is no evidence of blood clot given patient reporting tightness.  Will get CK level to evaluate for rhabdo.  Patient still able to ambulate around the room and there is no actual  weakness on my examination therefore I have low suspicion for neurological process at this time.  She has normal neuro exam.  She does report a little bit of shortness of breath and chest pain so we will get some cardiac markers, EKG, chest x-ray to evaluate for pneumonia, ACS.  I discussed with patient that given some going on for 2 weeks with her other things that can be causing her symptoms that may not be able to be determined here in the emergency room.  Patient will need follow-up with her primary care doctor and possible referral to other doctors.  I explained to her (that if she feels the pain is more of the joints that she could have developed arthritis and would need to see a rheumatologist for diagnosis for that.  However explained to patient that we would do a work-up to try to rule out some more of the emergent findings.  Patient is afebrile with normal white count do not think she has an infection.  Her urine has no evidence of UTI  Labs are reassuring.  No evidence of anemia, no evidence of heart attack, no evidence of heart failure, thyroid tests are normal, chest x-ray no evidence of pneumonia.  DVT ultrasounds are negative.  Explained the reassuring work-up with patient.  The patient does have pain she is able to ambulate around the room has not had any falls. She does report prior +ANA has never had a full work-up for this.  Discussed with patient that this could be arthritis and she can follow-up with her rheumatologist but the more important to follow-up with the primary care doctor  to plug her in with the right pupil.  Patient does report having a primary care doctor appointment tomorrow.  ________________________________________   FINAL CLINICAL IMPRESSION(S) / ED DIAGNOSES   Final diagnoses:  Pain in both lower extremities  Chest pain, unspecified type      MEDICATIONS GIVEN DURING THIS VISIT:  Medications  acetaminophen (TYLENOL) tablet 1,000 mg (1,000 mg Oral Given 10/24/19 1345)  ketorolac (TORADOL) 30 MG/ML injection 30 mg (30 mg Intramuscular Given 10/24/19 1345)     ED Discharge Orders    None       Note:  This document was prepared using Dragon voice recognition software and may include unintentional dictation errors.   Vanessa Blanco, MD 10/24/19 1420

## 2019-10-25 ENCOUNTER — Encounter: Payer: Self-pay | Admitting: Internal Medicine

## 2019-10-25 ENCOUNTER — Ambulatory Visit (INDEPENDENT_AMBULATORY_CARE_PROVIDER_SITE_OTHER): Payer: Medicare Other | Admitting: Internal Medicine

## 2019-10-25 ENCOUNTER — Other Ambulatory Visit: Payer: Self-pay

## 2019-10-25 VITALS — BP 142/72 | HR 71 | Temp 97.6°F | Resp 16 | Ht 65.0 in | Wt 223.8 lb

## 2019-10-25 DIAGNOSIS — M2550 Pain in unspecified joint: Secondary | ICD-10-CM | POA: Diagnosis not present

## 2019-10-25 DIAGNOSIS — I10 Essential (primary) hypertension: Secondary | ICD-10-CM

## 2019-10-25 DIAGNOSIS — R7303 Prediabetes: Secondary | ICD-10-CM

## 2019-10-25 DIAGNOSIS — M79605 Pain in left leg: Secondary | ICD-10-CM

## 2019-10-25 DIAGNOSIS — Z09 Encounter for follow-up examination after completed treatment for conditions other than malignant neoplasm: Secondary | ICD-10-CM

## 2019-10-25 DIAGNOSIS — R768 Other specified abnormal immunological findings in serum: Secondary | ICD-10-CM

## 2019-10-25 DIAGNOSIS — M79604 Pain in right leg: Secondary | ICD-10-CM

## 2019-10-25 NOTE — Patient Instructions (Signed)
Referral was placed today for rheumatology.

## 2019-10-26 ENCOUNTER — Telehealth: Payer: Self-pay

## 2019-10-26 NOTE — Telephone Encounter (Signed)
Patient called stating that she wanted to let the physician know that she continues to have upper (chest) and lower abdominal (groin) pain. Patient stated that she went to see her PCP yesterday because her abdominal pain is getting worse, nauseated, vomiting 2-3 times a week and diarrhea. However, her PCP told her to call her GI doctor and let us know. Patient wants to know what could be done. I told her that I would mention it to Dr. Bonna Gains and then I would call her back with recommendations. Patient agreed. Dr. Bonna Gains, can you please advise. Do you want me to schedule her an appointment or order labs or  Images on patient?

## 2019-10-26 NOTE — Telephone Encounter (Signed)
Virgel Manifold, MD  You 8 minutes ago (11:02 AM)   Clinic follow up. Ok to [employer] patient to let her know that Dr. Bonna Gains would like to see her in the clinic. Therefore, I offered her an appointment for tomorrow and she agreed.

## 2019-10-27 ENCOUNTER — Ambulatory Visit (INDEPENDENT_AMBULATORY_CARE_PROVIDER_SITE_OTHER): Payer: Medicare Other | Admitting: Gastroenterology

## 2019-10-27 ENCOUNTER — Other Ambulatory Visit: Payer: Self-pay

## 2019-10-27 ENCOUNTER — Encounter: Payer: Self-pay | Admitting: Gastroenterology

## 2019-10-27 VITALS — BP 148/83 | HR 80 | Temp 98.0°F | Ht 65.0 in | Wt 224.0 lb

## 2019-10-27 DIAGNOSIS — Z1211 Encounter for screening for malignant neoplasm of colon: Secondary | ICD-10-CM

## 2019-10-27 DIAGNOSIS — K5900 Constipation, unspecified: Secondary | ICD-10-CM | POA: Diagnosis not present

## 2019-10-27 DIAGNOSIS — R109 Unspecified abdominal pain: Secondary | ICD-10-CM

## 2019-10-27 DIAGNOSIS — R1084 Generalized abdominal pain: Secondary | ICD-10-CM

## 2019-10-27 MED ORDER — PANTOPRAZOLE SODIUM 40 MG PO TBEC: 40.0000 mg | DELAYED_RELEASE_TABLET | Freq: Every day | ORAL | 0 refills | Status: DC

## 2019-10-27 MED ORDER — PANTOPRAZOLE SODIUM 40 MG PO TBEC
40.0000 mg | DELAYED_RELEASE_TABLET | Freq: Every day | ORAL | 0 refills | Status: DC
Start: 2019-10-27 — End: 2019-10-27

## 2019-10-27 NOTE — Patient Instructions (Addendum)
Please take Metamucil daily as directed.   You have been started on Pantoprazole 40mg . Please pick this up from your pharmacy and take once daily. Please call our office if you have any issues.

## 2019-10-27 NOTE — Progress Notes (Signed)
Linda Antigua, MD [address]  [place]  [place], [place] [address]  Main: [phone]  Fax: [phone]   Primary Care Physician: Towanda Malkin, MD   Chief Complaint  Patient presents with  . Follow-up    Abdominal pain, diarrhea, vomiting     HPI: Linda Mejia is a 64 y.o. female here for follow-up of heartburn, abdominal pain, constipation.  She reports return of heartburn, and also epigastric abdominal pain.  No nausea or vomiting.  Is also reporting constipation, does not take anything daily for it.  No blood in stool.  Previous history: Patient had a positive H. pylori breath test last year, and was prescribed triple therapy by primary care provider.  She underwent EGD in January 2020 with gastric biopsies negative for H. pylori, but showing intestinal metaplasia.  Grade B esophagitis was also seen at that time.   Denies any NSAID use.  Previous history:  Last EGD April 2018 by Dr. Vicente Males for hematemesis 3 nonbleeding gastric antral ulcers reported, largest 6 mm in size Gastritis and duodenitis reported  June 2017 EGD by Dr. Allen Norris for coffee-ground emesis Gastric antrum erosion, gastric erythema, duodenal erythema, grade a esophagitis reported.  Hiatal hernia was reported  March 2016 EGD by Dr. Gustavo Lah Gastric ulcers reported at that time as well  Colonoscopy 2005 for hematochezia and diarrhea Normal terminal ileum, normal colon reported.  Impression was IBS  Current Outpatient Medications  Medication Sig Dispense Refill  . acyclovir (ZOVIRAX) 400 MG tablet Take 1 tablet (400 mg total) by mouth 2 (two) times daily. 60 tablet 5  . Alirocumab (PRALUENT) 75 MG/ML SOAJ Inject 1 each into the skin every 14 (fourteen) days. 6 pen 3  . amphetamine-dextroamphetamine (ADDERALL) 30 MG tablet Take 30 mg by mouth 2 (two) times daily.     Marland Kitchen aspirin EC 81 MG tablet Take 81 mg by mouth daily.    . Cyanocobalamin (VITAMIN B-12 PO) Take by mouth. Gummies     . diazepam (VALIUM) 10 MG tablet Take 1 tablet (10 mg total) by mouth every 8 (eight) hours as needed for anxiety. 30 tablet 0  . fluticasone (FLONASE) 50 MCG/ACT nasal spray Place 2 sprays into both nostrils daily as needed for allergies. 16 g 11  . metoprolol tartrate (LOPRESSOR) 50 MG tablet TAKE 1 TABLET BY MOUTH TWICE DAILY 60 tablet 5  . nystatin (MYCOSTATIN/NYSTOP) powder Apply topically 2 (two) times daily. (Patient taking differently: Apply topically as needed (rash). ) 15 g 0  . nitroGLYCERIN (NITROSTAT) 0.4 MG SL tablet Place 1 tablet (0.4 mg total) under the tongue every 5 (five) minutes as needed for chest pain. (Patient not taking: Reported on 10/25/2019) 30 tablet 0  . pantoprazole (PROTONIX) 40 MG tablet Take 1 tablet (40 mg total) by mouth daily. 30 tablet 0   No current facility-administered medications for this visit.    Allergies as of 10/27/2019 - Review Complete 10/27/2019  Allergen Reaction Noted  . Amlodipine  10/24/2019    ROS:  General: Negative for anorexia, weight loss, fever, chills, fatigue, weakness. ENT: Negative for hoarseness, difficulty swallowing , nasal congestion. CV: Negative for chest pain, angina, palpitations, dyspnea on exertion, peripheral edema.  Respiratory: Negative for dyspnea at rest, dyspnea on exertion, cough, sputum, wheezing.  GI: See history of present illness. GU:  Negative for dysuria, hematuria, urinary incontinence, urinary frequency, nocturnal urination.  Endo: Negative for unusual weight change.    Physical Examination:   BP (!) 148/83  Pulse 80   Temp 98 F (36.7 C) (Oral)   Ht 5\' 5"  (1.651 m)   Wt 224 lb (101.6 kg)   BMI 37.28 kg/m   General: Well-nourished, well-developed in no acute distress.  Eyes: No icterus. Conjunctivae pink. Mouth: Oropharyngeal mucosa moist and pink , no lesions erythema or exudate. Neck: Supple, Trachea midline Abdomen: Bowel sounds are normal, nontender, nondistended, no  hepatosplenomegaly or masses, no abdominal bruits or hernia , no rebound or guarding.   Extremities: No lower extremity edema. No clubbing or deformities. Neuro: Alert and oriented x 3.  Grossly intact. Skin: Warm and dry, no jaundice.   Psych: Alert and cooperative, normal mood and affect.   Labs: CMP     Component Value Date/Time   NA 137 10/24/2019 1012   NA 144 06/05/2015 1203   NA 142 08/03/2014 2009   K 3.8 10/24/2019 1012   K 3.6 08/03/2014 2009   CL 102 10/24/2019 1012   CL 108 08/03/2014 2009   CO2 25 10/24/2019 1012   CO2 27 08/03/2014 2009   GLUCOSE 108 (H) 10/24/2019 1012   GLUCOSE 102 (H) 08/03/2014 2009   BUN 14 10/24/2019 1012   BUN 13 06/05/2015 1203   BUN 15 08/03/2014 2009   CREATININE 0.62 10/24/2019 1012   CREATININE 0.78 12/06/2018 1406   CALCIUM 8.9 10/24/2019 1012   CALCIUM 8.3 (L) 08/03/2014 2009   PROT 7.7 10/24/2019 1012   PROT 6.9 08/03/2014 2009   ALBUMIN 4.2 10/24/2019 1012   ALBUMIN 3.9 08/03/2014 2009   AST 21 10/24/2019 1012   AST 24 08/03/2014 2009   ALT 16 10/24/2019 1012   ALT 12 (L) 08/03/2014 2009   ALKPHOS 76 10/24/2019 1012   ALKPHOS 63 08/03/2014 2009   BILITOT 0.5 10/24/2019 1012   BILITOT <0.1 (L) 08/03/2014 2009   GFRNONAA >60 10/24/2019 1012   GFRNONAA 81 12/06/2018 1406   GFRAA >60 10/24/2019 1012   GFRAA 94 12/06/2018 1406   Lab Results  Component Value Date   WBC 7.4 10/24/2019   HGB 13.3 10/24/2019   HCT 39.4 10/24/2019   MCV 94.5 10/24/2019   PLT 324 10/24/2019    Imaging Studies: DG Chest 2 View  Result Date: 10/24/2019 CLINICAL DATA:  Shortness of breath over the last 2 weeks. EXAM: CHEST - 2 VIEW COMPARISON:  08/26/2019 FINDINGS: Heart size is normal. Coronary artery calcification and stent. Aortic atherosclerotic calcification. Pulmonary vascularity is normal. The lungs are clear. No acute bone finding. Ordinary thoracic degenerative changes. Probable loose body left shoulder. IMPRESSION: No active disease.  Aortic atherosclerosis. Coronary artery calcification and stent. Electronically Signed   By: Nelson Chimes M.D.   On: 10/24/2019 12:57   US Venous Img Lower Bilateral  Result Date: 10/24/2019 CLINICAL DATA:  Bilateral lower extremity pain. EXAM: BILATERAL LOWER EXTREMITY VENOUS DOPPLER ULTRASOUND TECHNIQUE: Gray-scale sonography with graded compression, as well as color Doppler and duplex ultrasound were performed to evaluate the lower extremity deep venous systems from the level of the common femoral vein and including the common femoral, femoral, profunda femoral, popliteal and calf veins including the posterior tibial, peroneal and gastrocnemius veins when visible. The superficial great saphenous vein was also interrogated. Spectral Doppler was utilized to evaluate flow at rest and with distal augmentation maneuvers in the common femoral, femoral and popliteal veins. COMPARISON:  None. FINDINGS: RIGHT LOWER EXTREMITY Common Femoral Vein: No evidence of thrombus. Normal compressibility, respiratory phasicity and response to augmentation. Saphenofemoral Junction: No evidence of thrombus.  Normal compressibility and flow on color Doppler imaging. Profunda Femoral Vein: No evidence of thrombus. Normal compressibility and flow on color Doppler imaging. Femoral Vein: No evidence of thrombus. Normal compressibility, respiratory phasicity and response to augmentation. Popliteal Vein: No evidence of thrombus. Normal compressibility, respiratory phasicity and response to augmentation. Calf Veins: No evidence of thrombus. Normal compressibility and flow on color Doppler imaging. Superficial Great Saphenous Vein: No evidence of thrombus. Normal compressibility. Venous Reflux:  None. Other Findings: No evidence of superficial thrombophlebitis or abnormal fluid collection. LEFT LOWER EXTREMITY Common Femoral Vein: No evidence of thrombus. Normal compressibility, respiratory phasicity and response to augmentation.  Saphenofemoral Junction: No evidence of thrombus. Normal compressibility and flow on color Doppler imaging. Profunda Femoral Vein: No evidence of thrombus. Normal compressibility and flow on color Doppler imaging. Femoral Vein: No evidence of thrombus. Normal compressibility, respiratory phasicity and response to augmentation. Popliteal Vein: No evidence of thrombus. Normal compressibility, respiratory phasicity and response to augmentation. Calf Veins: No evidence of thrombus. Normal compressibility and flow on color Doppler imaging. Superficial Great Saphenous Vein: No evidence of thrombus. Normal compressibility. Venous Reflux:  None. Other Findings: No evidence of superficial thrombophlebitis or abnormal fluid collection. IMPRESSION: No evidence of deep venous thrombosis in either lower extremity. Electronically Signed   By: Aletta Edouard M.D.   On: 10/24/2019 13:40    Assessment and Plan:   Linda Mejia is a 64 y.o. y/o female here for follow-up of gastric ulcers, H. pylori and reports abdominal pain  On previous visit, plan was to repeat EGD for gastric mapping biopsies but patient did not get this scheduled.  She is agreeable to this at this time.  This would also allow [place] to evaluate for any underlying ulcers causing her abdominal pain.  Start PPI once daily due to the pain as well.  If symptoms no better in about 3 weeks, she was advised to let us know  She is also due for screening colonoscopy  High-fiber diet discussed  Metamucil daily discussed as well  High-fiber diet  Metamucil daily with goal of 1-2 soft bowel movements daily.  If not at goal, patient instructed to increase dose to twice daily.  If loose stools with the medication, patient asked to decrease the medication to every other day, or half dose daily.  Patient verbalized understanding     Dr Linda Mejia

## 2019-11-01 ENCOUNTER — Telehealth: Payer: Self-pay

## 2019-11-01 NOTE — Telephone Encounter (Signed)
Patient called stating that she had been feeling sick with abdominal pain and bloating, chest pain, nausea, vomiting, constipation and weak. Patient stated that for the past week or so she had not been feeling well and that she had been to the ED and seen her PCP but still doesn't feel any better. Patient has her procedure scheduled to be on 11/11/2019 but would like to know what she could do since she doesn't feel good. Please advise.

## 2019-11-01 NOTE — Telephone Encounter (Signed)
Virgel Manifold, MD  Wayna Chalet, CMA Caller: Unspecified (Today, 8:22 AM) If she is not moving her bowels daily, please take miralax daily. If she is already taking miralax daily, increase it to twice a day. Increase protonix to 40mg  BID for 30 days as well   Called patient to let her know what Dr. Bonna Gains recommended for her to do. Patient stated that she would go ahead and do what was recommended. Patient was told to call us in case she did not get any better. Patient understood and had no further questions.

## 2019-11-03 ENCOUNTER — Emergency Department: Payer: Medicare Other

## 2019-11-03 ENCOUNTER — Other Ambulatory Visit: Payer: Self-pay

## 2019-11-03 ENCOUNTER — Ambulatory Visit: Payer: Self-pay

## 2019-11-03 ENCOUNTER — Inpatient Hospital Stay
Admission: EM | Admit: 2019-11-03 | Discharge: 2019-11-06 | DRG: 377 | Disposition: A | Discharge status: Home / Self Care | Payer: Medicare Other | Attending: Family Medicine | Admitting: Family Medicine

## 2019-11-03 ENCOUNTER — Telehealth: Payer: Self-pay

## 2019-11-03 DIAGNOSIS — Z888 Allergy status to other drugs, medicaments and biological substances status: Secondary | ICD-10-CM

## 2019-11-03 DIAGNOSIS — E872 Acidosis, unspecified: Secondary | ICD-10-CM

## 2019-11-03 DIAGNOSIS — R5381 Other malaise: Secondary | ICD-10-CM

## 2019-11-03 DIAGNOSIS — E038 Other specified hypothyroidism: Secondary | ICD-10-CM | POA: Diagnosis present

## 2019-11-03 DIAGNOSIS — R0902 Hypoxemia: Secondary | ICD-10-CM | POA: Diagnosis present

## 2019-11-03 DIAGNOSIS — Z9861 Coronary angioplasty status: Secondary | ICD-10-CM

## 2019-11-03 DIAGNOSIS — Z8249 Family history of ischemic heart disease and other diseases of the circulatory system: Secondary | ICD-10-CM

## 2019-11-03 DIAGNOSIS — R140 Abdominal distension (gaseous): Secondary | ICD-10-CM

## 2019-11-03 DIAGNOSIS — R112 Nausea with vomiting, unspecified: Secondary | ICD-10-CM | POA: Diagnosis not present

## 2019-11-03 DIAGNOSIS — Z79899 Other long term (current) drug therapy: Secondary | ICD-10-CM

## 2019-11-03 DIAGNOSIS — K254 Chronic or unspecified gastric ulcer with hemorrhage: Principal | ICD-10-CM | POA: Diagnosis present

## 2019-11-03 DIAGNOSIS — M549 Dorsalgia, unspecified: Secondary | ICD-10-CM | POA: Diagnosis present

## 2019-11-03 DIAGNOSIS — Z8619 Personal history of other infectious and parasitic diseases: Secondary | ICD-10-CM

## 2019-11-03 DIAGNOSIS — K2980 Duodenitis without bleeding: Secondary | ICD-10-CM | POA: Diagnosis present

## 2019-11-03 DIAGNOSIS — K589 Irritable bowel syndrome without diarrhea: Secondary | ICD-10-CM | POA: Diagnosis present

## 2019-11-03 DIAGNOSIS — E039 Hypothyroidism, unspecified: Secondary | ICD-10-CM | POA: Diagnosis present

## 2019-11-03 DIAGNOSIS — F419 Anxiety disorder, unspecified: Secondary | ICD-10-CM | POA: Diagnosis present

## 2019-11-03 DIAGNOSIS — G8929 Other chronic pain: Secondary | ICD-10-CM | POA: Diagnosis present

## 2019-11-03 DIAGNOSIS — J9601 Acute respiratory failure with hypoxia: Secondary | ICD-10-CM

## 2019-11-03 DIAGNOSIS — K920 Hematemesis: Secondary | ICD-10-CM

## 2019-11-03 DIAGNOSIS — R079 Chest pain, unspecified: Secondary | ICD-10-CM

## 2019-11-03 DIAGNOSIS — Z8719 Personal history of other diseases of the digestive system: Secondary | ICD-10-CM

## 2019-11-03 DIAGNOSIS — Z9049 Acquired absence of other specified parts of digestive tract: Secondary | ICD-10-CM

## 2019-11-03 DIAGNOSIS — Z7982 Long term (current) use of aspirin: Secondary | ICD-10-CM

## 2019-11-03 DIAGNOSIS — Z20822 Contact with and (suspected) exposure to covid-19: Secondary | ICD-10-CM | POA: Diagnosis present

## 2019-11-03 DIAGNOSIS — K2970 Gastritis, unspecified, without bleeding: Secondary | ICD-10-CM | POA: Diagnosis present

## 2019-11-03 DIAGNOSIS — Z8711 Personal history of peptic ulcer disease: Secondary | ICD-10-CM

## 2019-11-03 DIAGNOSIS — I272 Pulmonary hypertension, unspecified: Secondary | ICD-10-CM | POA: Diagnosis present

## 2019-11-03 DIAGNOSIS — F909 Attention-deficit hyperactivity disorder, unspecified type: Secondary | ICD-10-CM | POA: Diagnosis present

## 2019-11-03 DIAGNOSIS — N179 Acute kidney failure, unspecified: Secondary | ICD-10-CM

## 2019-11-03 DIAGNOSIS — K2100 Gastro-esophageal reflux disease with esophagitis, without bleeding: Secondary | ICD-10-CM | POA: Diagnosis present

## 2019-11-03 DIAGNOSIS — I25119 Atherosclerotic heart disease of native coronary artery with unspecified angina pectoris: Secondary | ICD-10-CM | POA: Diagnosis present

## 2019-11-03 DIAGNOSIS — Z7952 Long term (current) use of systemic steroids: Secondary | ICD-10-CM

## 2019-11-03 DIAGNOSIS — R0789 Other chest pain: Secondary | ICD-10-CM | POA: Diagnosis present

## 2019-11-03 DIAGNOSIS — Z87891 Personal history of nicotine dependence: Secondary | ICD-10-CM

## 2019-11-03 DIAGNOSIS — I10 Essential (primary) hypertension: Secondary | ICD-10-CM | POA: Diagnosis present

## 2019-11-03 LAB — CBC WITH DIFFERENTIAL/PLATELET
Abs Immature Granulocytes: 0.03 10*3/uL (ref 0.00–0.07)
Basophils Absolute: 0 10*3/uL (ref 0.0–0.1)
Basophils Relative: 0 %
Eosinophils Absolute: 0 10*3/uL (ref 0.0–0.5)
Eosinophils Relative: 0 %
HCT: 40.6 % (ref 36.0–46.0)
Hemoglobin: 13.3 g/dL (ref 12.0–15.0)
Immature Granulocytes: 0 %
Lymphocytes Relative: 24 %
Lymphs Abs: 2.1 10*3/uL (ref 0.7–4.0)
MCH: 31.6 pg (ref 26.0–34.0)
MCHC: 32.8 g/dL (ref 30.0–36.0)
MCV: 96.4 fL (ref 80.0–100.0)
Monocytes Absolute: 0.5 10*3/uL (ref 0.1–1.0)
Monocytes Relative: 6 %
Neutro Abs: 6.1 10*3/uL (ref 1.7–7.7)
Neutrophils Relative %: 70 %
Platelets: 343 10*3/uL (ref 150–400)
RBC: 4.21 MIL/uL (ref 3.87–5.11)
RDW: 13.2 % (ref 11.5–15.5)
WBC: 8.7 10*3/uL (ref 4.0–10.5)
nRBC: 0 % (ref 0.0–0.2)

## 2019-11-03 LAB — BASIC METABOLIC PANEL
Anion gap: 11 (ref 5–15)
BUN: 22 mg/dL (ref 8–23)
CO2: 25 mmol/L (ref 22–32)
Calcium: 8.7 mg/dL — ABNORMAL LOW (ref 8.9–10.3)
Chloride: 103 mmol/L (ref 98–111)
Creatinine, Ser: 1.08 mg/dL — ABNORMAL HIGH (ref 0.44–1.00)
GFR calc Af Amer: >60 mL/min (ref >60)
GFR calc non Af Amer: 55 mL/min — ABNORMAL LOW (ref >60)
Glucose, Bld: 153 mg/dL — ABNORMAL HIGH (ref 70–99)
Potassium: 4 mmol/L (ref 3.5–5.1)
Sodium: 139 mmol/L (ref 135–145)

## 2019-11-03 LAB — HEPATIC FUNCTION PANEL
ALT: 18 U/L (ref 0–44)
AST: 28 U/L (ref 15–41)
Albumin: 4 g/dL (ref 3.5–5.0)
Alkaline Phosphatase: 63 U/L (ref 38–126)
Bilirubin, Direct: <0.1 mg/dL (ref 0.0–0.2)
Total Bilirubin: 0.5 mg/dL (ref 0.3–1.2)
Total Protein: 7.4 g/dL (ref 6.5–8.1)

## 2019-11-03 LAB — LIPASE, BLOOD: Lipase: 35 U/L (ref 11–51)

## 2019-11-03 LAB — LACTIC ACID, PLASMA
Lactic Acid, Venous: 2 mmol/L (ref 0.5–1.9)
Lactic Acid, Venous: 0.9 mmol/L (ref 0.5–1.9)

## 2019-11-03 LAB — TSH: TSH: 0.591 u[IU]/mL (ref 0.350–4.500)

## 2019-11-03 LAB — TROPONIN I (HIGH SENSITIVITY): Troponin I (High Sensitivity): 4 ng/L (ref <18)

## 2019-11-03 LAB — SARS CORONAVIRUS 2 BY RT PCR (HOSPITAL ORDER, PERFORMED IN ~~LOC~~ HOSPITAL LAB): SARS Coronavirus 2: NEGATIVE

## 2019-11-03 MED ORDER — ONDANSETRON HCL 4 MG/2ML IJ SOLN: 4.0000 mg | Freq: Four times a day (QID) | INTRAMUSCULAR | Status: DC | PRN

## 2019-11-03 MED ORDER — LACTATED RINGERS IV BOLUS
1000.0000 mL | Freq: Once | INTRAVENOUS | Status: AC
  Administered 2019-11-03: 1000 mL via INTRAVENOUS

## 2019-11-03 MED ORDER — ACETAMINOPHEN 325 MG PO TABS: 650.0000 mg | ORAL_TABLET | Freq: Four times a day (QID) | ORAL | Status: DC | PRN

## 2019-11-03 MED ORDER — HYDROCODONE-ACETAMINOPHEN 5-325 MG PO TABS
1.0000 | ORAL_TABLET | ORAL | Status: DC | PRN
  Administered 2019-11-04 – 2019-11-06 (×5): 2 via ORAL
  Filled 2019-11-03 (×5): qty 2

## 2019-11-03 MED ORDER — DIAZEPAM 5 MG PO TABS
10.0000 mg | ORAL_TABLET | Freq: Three times a day (TID) | ORAL | Status: DC | PRN
  Administered 2019-11-03: 10 mg via ORAL
  Filled 2019-11-03: qty 2

## 2019-11-03 MED ORDER — PANTOPRAZOLE SODIUM 40 MG PO TBEC
40.0000 mg | DELAYED_RELEASE_TABLET | Freq: Every day | ORAL | Status: DC
  Administered 2019-11-05 – 2019-11-06 (×2): 40 mg via ORAL
  Filled 2019-11-03 (×2): qty 1

## 2019-11-03 MED ORDER — ASPIRIN EC 81 MG PO TBEC
81.0000 mg | DELAYED_RELEASE_TABLET | Freq: Every day | ORAL | Status: DC
  Administered 2019-11-05 – 2019-11-06 (×2): 81 mg via ORAL
  Filled 2019-11-03 (×2): qty 1

## 2019-11-03 MED ORDER — ACETAMINOPHEN 650 MG RE SUPP: 650.0000 mg | Freq: Four times a day (QID) | RECTAL | Status: DC | PRN

## 2019-11-03 MED ORDER — IOHEXOL 300 MG/ML  SOLN
75.0000 mL | Freq: Once | INTRAMUSCULAR | Status: DC | PRN
  Filled 2019-11-03: qty 75

## 2019-11-03 MED ORDER — METOPROLOL TARTRATE 50 MG PO TABS
50.0000 mg | ORAL_TABLET | Freq: Two times a day (BID) | ORAL | Status: DC
  Administered 2019-11-03 – 2019-11-06 (×5): 50 mg via ORAL
  Filled 2019-11-03 (×5): qty 1

## 2019-11-03 MED ORDER — DEXAMETHASONE SODIUM PHOSPHATE 10 MG/ML IJ SOLN
10.0000 mg | Freq: Once | INTRAMUSCULAR | Status: AC
  Administered 2019-11-03: 10 mg via INTRAVENOUS
  Filled 2019-11-03: qty 1

## 2019-11-03 MED ORDER — NITROGLYCERIN 0.4 MG SL SUBL: 0.4000 mg | SUBLINGUAL_TABLET | SUBLINGUAL | Status: DC | PRN

## 2019-11-03 MED ORDER — ENOXAPARIN SODIUM 40 MG/0.4ML ~~LOC~~ SOLN: 40.0000 mg | SUBCUTANEOUS | Status: DC

## 2019-11-03 MED ORDER — SODIUM CHLORIDE 0.9 % IV SOLN: INTRAVENOUS | Status: DC

## 2019-11-03 MED ORDER — ONDANSETRON HCL 4 MG/2ML IJ SOLN
4.0000 mg | Freq: Once | INTRAMUSCULAR | Status: AC
  Administered 2019-11-03: 4 mg via INTRAVENOUS
  Filled 2019-11-03: qty 2

## 2019-11-03 MED ORDER — ONDANSETRON HCL 4 MG PO TABS: 4.0000 mg | ORAL_TABLET | Freq: Four times a day (QID) | ORAL | Status: DC | PRN

## 2019-11-03 MED ORDER — MORPHINE SULFATE (PF) 4 MG/ML IV SOLN
4.0000 mg | Freq: Once | INTRAVENOUS | Status: AC
  Administered 2019-11-03: 4 mg via INTRAVENOUS
  Filled 2019-11-03: qty 1

## 2019-11-03 MED ORDER — IOHEXOL 350 MG/ML SOLN
75.0000 mL | Freq: Once | INTRAVENOUS | Status: AC | PRN
  Administered 2019-11-03: 75 mL via INTRAVENOUS

## 2019-11-03 MED ORDER — MORPHINE SULFATE (PF) 2 MG/ML IV SOLN
2.0000 mg | INTRAVENOUS | Status: DC | PRN
  Administered 2019-11-03 – 2019-11-04 (×2): 2 mg via INTRAVENOUS
  Filled 2019-11-03 (×2): qty 1

## 2019-11-03 MED ORDER — AMPHETAMINE-DEXTROAMPHETAMINE 10 MG PO TABS
30.0000 mg | ORAL_TABLET | Freq: Two times a day (BID) | ORAL | Status: DC
  Filled 2019-11-03: qty 3

## 2019-11-03 NOTE — H&P (Signed)
History and Physical    Linda Mejia ZCH:[id number] DOB: 1955-06-09 DOA: 11/03/2019  PCP: Towanda Malkin, MD   Patient coming from: Home  I have personally briefly reviewed patient's old medical records in [place]  Chief Complaint: Nausea, vomiting, malaise  HPI: Linda Mejia is a 64 y.o. female with medical history significant for CAD, anxiety, HTN, PUD, who presents to the emergency room with a 1 week history of malaise, body aches, subjective fever, nausea with a few episodes of emesis, one with coffee-ground, since resolved, with 1 day history of left-sided chest pain, located under the left breast, described as sharp and stabbing, nonradiating of moderate to severe intensity with no aggravating or alleviating factors and no associated shortness of breath or diaphoresis.  She has no cough.  Patient saw her PCP in the last few days and was found to have an elevated ANA and was started empirically on prednisone.  She also was seen by her gastroenterologist on 10/27/2019 with a complaint of abdominal pain and the plan was for repeat EGD and screening.  Her last EGD was April 2018 for hematemesis when she had 3 nonbleeding gastric antral ulcers.  ED Course: On arrival in the emergency room she was tachycardic and had a low-grade temperature of 99.1.  Patient was noted to desat to 86% on room air while in the ER requiring O2 at 2 L to get his sats up to the mid 90s.Marland Kitchen  Her blood work was significant for creatinine of 1.08, up from 0.73 and she had a lactic acid of 2.  Lipase normal at 35.  WBC 8700, hemoglobin normal at 13.3.  Troponin IV.  EKG with no acute ST-T wave changes.  CTA chest negative for PE.  CT abdomen and pelvis with contrast with no findings to explain symptoms.  Hospitalist consulted for admission.  Review of Systems: As per HPI otherwise all other systems on review of systems negative.    Past Medical History:  Diagnosis Date  . Abnormal ankle brachial index (ABI)  12/02/2016  . ADHD (attention deficit hyperactivity disorder)   . Anxiety   . Aortic atherosclerosis ([place]) 12/02/2016   July 2018  . Chronic back pain   . Coronary artery disease   . Herpes genitalis in women   . History of stomach ulcers    x7 this year. Bleeding  . Hypertension   . Prediabetes 12/02/2016  . Pulmonary hypertension ([place]) 03/09/2018   Echo Oct 2019    Past Surgical History:  Procedure Laterality Date  . APPENDECTOMY    . CARDIAC CATHETERIZATION    . CESAREAN SECTION     x2  . COLON SURGERY     interception  . ESOPHAGOGASTRODUODENOSCOPY (EGD) WITH PROPOFOL N/A 09/21/2015   Procedure: ESOPHAGOGASTRODUODENOSCOPY (EGD) WITH PROPOFOL;  Surgeon: Lucilla Lame, MD;  Location: [facility] END[facility]COPY;  Service: Endoscopy;  Laterality: N/A;  . ESOPHAGOGASTRODUODENOSCOPY (EGD) WITH PROPOFOL N/A 07/30/2016   Procedure: ESOPHAGOGASTRODUODENOSCOPY (EGD) WITH PROPOFOL;  Surgeon: Jonathon Bellows, MD;  Location: [facility] END[facility]COPY;  Service: Endoscopy;  Laterality: N/A;  . ESOPHAGOGASTRODUODENOSCOPY (EGD) WITH PROPOFOL N/A 04/23/2018   Procedure: ESOPHAGOGASTRODUODENOSCOPY (EGD) WITH PROPOFOL;  Surgeon: Virgel Manifold, MD;  Location: [facility]COPY;  Service: Endoscopy;  Laterality: N/A;  . LEFT HEART CATH Right 01/25/2018   Procedure: Left Heart Cath and Coronary Angiography;  Surgeon: Dionisio David, MD;  Location: [facility];  Service: Cardiovascular;  Laterality: Right;  . LEFT HEART CATH AND CORONARY ANGIOGRAPHY Right 08/12/2016  Procedure: Left Heart Cath and Coronary Angiography;  Surgeon: Dionisio David, MD;  Location: [facility];  Service: Cardiovascular;  Laterality: Right;  . TONSILLECTOMY    . TUBAL LIGATION       reports that she quit smoking about 15 years ago. Her smoking use included cigarettes. She has a 30.00 pack-year smoking history. She has never used smokeless tobacco. She reports current alcohol use. She reports that she does not use drugs.  Allergies   Allergen Reactions  . Amlodipine     Leg swelling    Family History  Problem Relation Age of [place]  . Diabetes Mother   . CVA Mother   . Heart disease Father   . Heart disease Sister   . Breast cancer Sister   . Heart disease Brother   . Heart disease Sister   . Heart disease Sister   . Heart disease Sister   . Heart disease Brother   . Heart disease Brother   . Heart disease Brother   . Heart disease Brother   . Healthy Daughter   . Healthy Daughter       Prior to Admission medications   Medication Sig Start Date End Date Taking? Authorizing Provider  acyclovir (ZOVIRAX) 400 MG tablet Take 1 tablet (400 mg total) by mouth 2 (two) times daily. 02/15/19  Yes Hubbard Hartshorn, FNP  Alirocumab (PRALUENT) 75 MG/ML SOAJ Inject 1 each into the skin every 14 (fourteen) days. 12/09/18  Yes Hubbard Hartshorn, FNP  amphetamine-dextroamphetamine (ADDERALL) 30 MG tablet Take 30 mg by mouth 2 (two) times daily.  12/02/16  Yes Lada, Satira Anis, MD  aspirin EC 81 MG tablet Take 81 mg by mouth daily.   Yes [provider]  cyanocobalamin 1000 MCG tablet Take 1,000 mcg by mouth daily.   Yes [provider]  diazepam (VALIUM) 10 MG tablet Take 1 tablet (10 mg total) by mouth every 8 (eight) hours as needed for anxiety. 09/22/15  Yes Fritzi Mandes, MD  metoprolol tartrate (LOPRESSOR) 50 MG tablet TAKE 1 TABLET BY MOUTH TWICE DAILY Patient taking differently: Take 50 mg by mouth 2 (two) times daily.  08/07/19  Yes Hubbard Hartshorn, FNP  nitroGLYCERIN (NITROSTAT) 0.4 MG SL tablet Place 1 tablet (0.4 mg total) under the tongue every 5 (five) minutes as needed for chest pain. 01/26/18  Yes Mody, Ulice Bold, MD  pantoprazole (PROTONIX) 40 MG tablet Take 1 tablet (40 mg total) by mouth daily. 10/27/19  Yes [name] B, MD  predniSONE (DELTASONE) 5 MG tablet Take 5-30 mg by mouth daily. 11/02/19  Yes [provider]  fluticasone (FLONASE) 50 MCG/ACT nasal spray Place 2 sprays into both  nostrils daily as needed for allergies. 02/08/19   Hubbard Hartshorn, FNP  nystatin (MYCOSTATIN/NYSTOP) powder Apply topically 2 (two) times daily. Patient taking differently: Apply topically as needed (rash).  05/25/18   Dustin Flock, MD    Physical Exam: Vitals:   11/03/19 1538 11/03/19 1653 11/03/19 1700 11/03/19 1800  BP:  (!) 160/93 (!) 128/114 (!) 165/88  Pulse:  (!) 109 105 100  Resp:  15 18 19   Temp:      TempSrc:      SpO2:  97% 98% 95%  Weight: (!) 99.3 kg     Height: 5\' 5"  (1.651 m)        Vitals:   11/03/19 1538 11/03/19 1653 11/03/19 1700 11/03/19 1800  BP:  (!) 160/93 (!) 128/114 (!) 165/88  Pulse:  (!) 109 105 100  Resp:  15 18 19   Temp:      TempSrc:      SpO2:  97% 98% 95%  Weight: (!) 99.3 kg     Height: 5\' 5"  (1.651 m)         Constitutional: Alert and oriented x 3 . Not in any apparent distress HEENT:      Head: Normocephalic and atraumatic.         Eyes: PERLA, EOMI, Conjunctivae are normal. Sclera is non-icteric.       Mouth/Throat: Mucous membranes are moist.       Neck: Supple with no signs of meningismus. Cardiovascular: Regular rate and rhythm. No murmurs, gallops, or rubs. 2+ symmetrical distal pulses are present . No JVD. No LE edema Respiratory: Respiratory effort normal .Lungs sounds clear bilaterally. No wheezes, crackles, or rhonchi.  Gastrointestinal: Soft, tender in left upper quadrant, and non distended with positive bowel sounds. No rebound or guarding. Genitourinary: No CVA tenderness. Musculoskeletal: Nontender with normal range of motion in all extremities. No cyanosis, or erythema of extremities. Neurologic: Normal speech and language. Face is symmetric. Moving all extremities. No gross focal neurologic deficits . Skin: Skin is warm, dry.  No rash or ulcers Psychiatric: Mood and affect are normal Speech and behavior are normal   Labs on Admission: I have personally reviewed following labs and imaging studies  CBC: Recent Labs   Lab 11/03/19 1550  WBC 8.7  NEUTROABS 6.1  HGB 13.3  HCT 40.6  MCV 96.4  PLT 423   Basic Metabolic Panel: Recent Labs  Lab 11/03/19 1550  NA 139  K 4.0  CL 103  CO2 25  GLUCOSE 153*  BUN 22  CREATININE 1.08*  CALCIUM 8.7*   GFR: Estimated Creatinine Clearance: 62.2 mL/min (A) (by C-G formula based on SCr of 1.08 mg/dL (H)). Liver Function Tests: Recent Labs  Lab 11/03/19 1722  AST 28  ALT 18  ALKPHOS 63  BILITOT 0.5  PROT 7.4  ALBUMIN 4.0   Recent Labs  Lab 11/03/19 1722  LIPASE 35   No results for input(s): AMMONIA in the last 168 hours. Coagulation Profile: No results for input(s): INR, PROTIME in the last 168 hours. Cardiac Enzymes: No results for input(s): CKTOTAL, CKMB, CKMBINDEX, TROPONINI in the last 168 hours. BNP (last 3 results) No results for input(s): PROBNP in the last 8760 hours. HbA1C: No results for input(s): HGBA1C in the last 72 hours. CBG: No results for input(s): GLUCAP in the last 168 hours. Lipid Profile: No results for input(s): CHOL, HDL, LDLCALC, TRIG, CHOLHDL, LDLDIRECT in the last 72 hours. Thyroid Function Tests: Recent Labs    11/03/19 1722  TSH 0.591   Anemia Panel: No results for input(s): VITAMINB12, FOLATE, FERRITIN, TIBC, IRON, RETICCTPCT in the last 72 hours. Urine analysis:    Component Value Date/Time   COLORURINE YELLOW (A) 10/24/2019 1236   APPEARANCEUR CLEAR (A) 10/24/2019 1236   APPEARANCEUR Clear 08/03/2014 2125   LABSPEC 1.015 10/24/2019 1236   LABSPEC 1.006 08/03/2014 2125   PHURINE 5.0 10/24/2019 1236   GLUCOSEU NEGATIVE 10/24/2019 1236   GLUCOSEU Negative 08/03/2014 2125   HGBUR NEGATIVE 10/24/2019 1236   BILIRUBINUR NEGATIVE 10/24/2019 1236   BILIRUBINUR neg 06/14/2018 1059   BILIRUBINUR Negative 08/03/2014 2125   KETONESUR NEGATIVE 10/24/2019 1236   PROTEINUR NEGATIVE 10/24/2019 1236   UROBILINOGEN 0.2 06/14/2018 1059   NITRITE NEGATIVE 10/24/2019 1236   LEUKOCYTESUR NEGATIVE 10/24/2019  1236  LEUKOCYTESUR Negative 08/03/2014 2125    Radiological Exams on Admission: DG Chest 2 View  Result Date: 11/03/2019 CLINICAL DATA:  Chest pain, vomiting, coffee-ground emesis EXAM: CHEST - 2 VIEW COMPARISON:  10/24/2019 FINDINGS: Minimal lingular atelectasis or scarring. The lungs are otherwise clear. No pneumothorax or pleural effusion. Cardiac size within normal limits. Pulmonary vascularity normal. No acute bone abnormality. IMPRESSION: No active cardiopulmonary disease. Electronically Signed   By: Fidela Salisbury MD   On: 11/03/2019 18:05   CT Angio Chest PE W/Cm &/Or Wo Cm  Result Date: 11/03/2019 CLINICAL DATA:  Epigastric and chest pain with 2 days of emesis, cough a ground emesis, wheezing EXAM: CT ANGIOGRAPHY CHEST CT ABDOMEN AND PELVIS WITH CONTRAST TECHNIQUE: Multidetector CT imaging of the chest was performed using the standard protocol during bolus administration of intravenous contrast. Multiplanar CT image reconstructions and MIPs were obtained to evaluate the vascular anatomy. Multidetector CT imaging of the abdomen and pelvis was performed using the standard protocol during bolus administration of intravenous contrast. CONTRAST:  103mL OMNIPAQUE IOHEXOL 350 MG/ML SOLN COMPARISON:  Chest CT 01/28/2019, chest radiograph 11/03/2019, CT chest, abdomen and pelvis 05/28/2018 FINDINGS: CTA CHEST FINDINGS Cardiovascular: Satisfactory opacification the pulmonary arteries to the segmental level. No pulmonary artery filling defects are identified. Central pulmonary arteries are normal caliber. Normal heart size. No pericardial effusion. Three-vessel coronary artery calcifications are present. Atherosclerotic plaque within the normal caliber aorta. No acute luminal abnormality. No periaortic stranding or hemorrhage. Normal 3 vessel branching of the aortic arch with calcified plaque within the proximal great vessels. No major venous abnormalities. Mediastinum/Nodes: Few mildly prominent  mediastinal and hilar nodes including a larger 10 mm precarinal node (2/29), and 10 mm right hilar node (2/36) are not significantly changed from prior. No new enlarged or enlarging worrisome mediastinal, hilar or axillary lymph nodes. No mediastinal fluid or gas. Normal thyroid gland and thoracic inlet. Lungs/Pleura: Respiratory motion degraded images. No consolidation, features of edema, pneumothorax, or effusion. Dependent atelectatic changes posteriorly. Some mosaic areas of attenuation in the lungs may reflect areas of air trapping or small airways disease. Musculoskeletal: No acute or worrisome osseous abnormalities in the spine. Stable mild anterior wedging at T12 unchanged from prior with superimposed Schmorl's node formations. Mild multilevel degenerative changes with slightly exaggerated thoracic kyphosis. Additional degenerative changes in the shoulders including a mineralized joint body in the medial recess of the left shoulder. No worrisome chest wall lesions. Review of the MIP images confirms the above findings. CT ABDOMEN and PELVIS FINDINGS Hepatobiliary: No worrisome focal liver lesions. Smooth liver surface contour. Normal hepatic attenuation. Gallbladder largely decompressed at the time of exam imaging with wall thickening appropriate for this degree of distention. No visible calcified gallstones or biliary ductal dilatation. No pericholecystic fluid or inflammation. Pancreas: Unremarkable. No pancreatic ductal dilatation or surrounding inflammatory changes. Spleen: Normal in size. No concerning splenic lesions. Adrenals/Urinary Tract: Normal adrenal glands. Kidneys enhance and excrete symmetrically. No concerning renal lesions. No urolithiasis or hydronephrosis. Urinary bladder is unremarkable. Stomach/Bowel: Distal esophagus, stomach and duodenal sweep are unremarkable. No small bowel wall thickening or dilatation. No evidence of obstruction. Small appendiceal stump/remnant in the right lower  quadrant (7/33-36) with chart indicating prior appendectomy. Slight mobility of the cecum without evidence of volvulus. No colonic dilatation or wall thickening. Scattered colonic diverticula without focal inflammation to suggest diverticulitis. Vascular/Lymphatic: Atherosclerotic calcifications within the abdominal aorta and branch vessels. Multilobular fusiform ectasia of the infrarenal abdominal aorta with maximal diameter measuring up to 3.2 cm  in size. No other aneurysm or ectasia. No enlarged abdominopelvic lymph nodes. Reproductive: Slightly retroverted uterus. No concerning adnexal lesions. Other: No abdominopelvic free fluid or free gas. No bowel containing hernias. Small fat containing umbilical hernia. Musculoskeletal: No acute osseous abnormality or suspicious osseous lesion. Multilevel degenerative changes are present in the imaged portions of the spine. Mild degenerative changes in the hips and pelvis. Review of the MIP images confirms the above findings. IMPRESSION: 1. No evidence of pulmonary embolism. 2. Mosaic areas of attenuation in the lungs may reflect areas of air trapping or small airways disease. 3. No other acute findings in the chest, abdomen or pelvis to explain the patient's symptoms. 4. Multilobular fusiform ectasia of the infrarenal abdominal aorta with maximal diameter measuring up to 3.2 cm in size. Recommend followup by ultrasound in 3 years. This recommendation follows ACR consensus guidelines: White Paper of the ACR Incidental Findings Committee II on Vascular Findings. J Am Coll Radiol 2013; 10:789-794. Aortic aneurysm NOS (ICD10-I71.9) 5. Three-vessel coronary artery calcifications are present. Please note that the presence of coronary artery calcium documents the presence of coronary artery disease, the severity of this disease and any potential stenosis cannot be assessed on this non-gated CT examination. Assessment for potential risk factor modification, dietary therapy or  pharmacologic therapy may be warranted. 6. Aortic Atherosclerosis (ICD10-I70.0). Electronically Signed   By: Lovena Le M.D.   On: 11/03/2019 19:11   CT Abdomen Pelvis W Contrast  Result Date: 11/03/2019 CLINICAL DATA:  Epigastric and chest pain with 2 days of emesis, cough a ground emesis, wheezing EXAM: CT ANGIOGRAPHY CHEST CT ABDOMEN AND PELVIS WITH CONTRAST TECHNIQUE: Multidetector CT imaging of the chest was performed using the standard protocol during bolus administration of intravenous contrast. Multiplanar CT image reconstructions and MIPs were obtained to evaluate the vascular anatomy. Multidetector CT imaging of the abdomen and pelvis was performed using the standard protocol during bolus administration of intravenous contrast. CONTRAST:  3mL OMNIPAQUE IOHEXOL 350 MG/ML SOLN COMPARISON:  Chest CT 01/28/2019, chest radiograph 11/03/2019, CT chest, abdomen and pelvis 05/28/2018 FINDINGS: CTA CHEST FINDINGS Cardiovascular: Satisfactory opacification the pulmonary arteries to the segmental level. No pulmonary artery filling defects are identified. Central pulmonary arteries are normal caliber. Normal heart size. No pericardial effusion. Three-vessel coronary artery calcifications are present. Atherosclerotic plaque within the normal caliber aorta. No acute luminal abnormality. No periaortic stranding or hemorrhage. Normal 3 vessel branching of the aortic arch with calcified plaque within the proximal great vessels. No major venous abnormalities. Mediastinum/Nodes: Few mildly prominent mediastinal and hilar nodes including a larger 10 mm precarinal node (2/29), and 10 mm right hilar node (2/36) are not significantly changed from prior. No new enlarged or enlarging worrisome mediastinal, hilar or axillary lymph nodes. No mediastinal fluid or gas. Normal thyroid gland and thoracic inlet. Lungs/Pleura: Respiratory motion degraded images. No consolidation, features of edema, pneumothorax, or effusion.  Dependent atelectatic changes posteriorly. Some mosaic areas of attenuation in the lungs may reflect areas of air trapping or small airways disease. Musculoskeletal: No acute or worrisome osseous abnormalities in the spine. Stable mild anterior wedging at T12 unchanged from prior with superimposed Schmorl's node formations. Mild multilevel degenerative changes with slightly exaggerated thoracic kyphosis. Additional degenerative changes in the shoulders including a mineralized joint body in the medial recess of the left shoulder. No worrisome chest wall lesions. Review of the MIP images confirms the above findings. CT ABDOMEN and PELVIS FINDINGS Hepatobiliary: No worrisome focal liver lesions. Smooth liver surface contour. Normal  hepatic attenuation. Gallbladder largely decompressed at the time of exam imaging with wall thickening appropriate for this degree of distention. No visible calcified gallstones or biliary ductal dilatation. No pericholecystic fluid or inflammation. Pancreas: Unremarkable. No pancreatic ductal dilatation or surrounding inflammatory changes. Spleen: Normal in size. No concerning splenic lesions. Adrenals/Urinary Tract: Normal adrenal glands. Kidneys enhance and excrete symmetrically. No concerning renal lesions. No urolithiasis or hydronephrosis. Urinary bladder is unremarkable. Stomach/Bowel: Distal esophagus, stomach and duodenal sweep are unremarkable. No small bowel wall thickening or dilatation. No evidence of obstruction. Small appendiceal stump/remnant in the right lower quadrant (7/33-36) with chart indicating prior appendectomy. Slight mobility of the cecum without evidence of volvulus. No colonic dilatation or wall thickening. Scattered colonic diverticula without focal inflammation to suggest diverticulitis. Vascular/Lymphatic: Atherosclerotic calcifications within the abdominal aorta and branch vessels. Multilobular fusiform ectasia of the infrarenal abdominal aorta with maximal  diameter measuring up to 3.2 cm in size. No other aneurysm or ectasia. No enlarged abdominopelvic lymph nodes. Reproductive: Slightly retroverted uterus. No concerning adnexal lesions. Other: No abdominopelvic free fluid or free gas. No bowel containing hernias. Small fat containing umbilical hernia. Musculoskeletal: No acute osseous abnormality or suspicious osseous lesion. Multilevel degenerative changes are present in the imaged portions of the spine. Mild degenerative changes in the hips and pelvis. Review of the MIP images confirms the above findings. IMPRESSION: 1. No evidence of pulmonary embolism. 2. Mosaic areas of attenuation in the lungs may reflect areas of air trapping or small airways disease. 3. No other acute findings in the chest, abdomen or pelvis to explain the patient's symptoms. 4. Multilobular fusiform ectasia of the infrarenal abdominal aorta with maximal diameter measuring up to 3.2 cm in size. Recommend followup by ultrasound in 3 years. This recommendation follows ACR consensus guidelines: White Paper of the ACR Incidental Findings Committee II on Vascular Findings. J Am Coll Radiol 2013; 10:789-794. Aortic aneurysm NOS (ICD10-I71.9) 5. Three-vessel coronary artery calcifications are present. Please note that the presence of coronary artery calcium documents the presence of coronary artery disease, the severity of this disease and any potential stenosis cannot be assessed on this non-gated CT examination. Assessment for potential risk factor modification, dietary therapy or pharmacologic therapy may be warranted. 6. Aortic Atherosclerosis (ICD10-I70.0). Electronically Signed   By: Lovena Le M.D.   On: 11/03/2019 19:11    EKG: Independently reviewed. Interpretation : Sinus tachycardia with no acute ST-T wave changes  Assessment/Plan 64 year old female with CAD, anxiety, HTN, PUD, who presents to the emergency room with a 1 week history of malaise, body aches, subjective fever,  nausea with a few episodes of emesis, one with coffee-ground    Nausea and vomiting, with history of gastric ulcer -Patient presents with left-sided x1 day chest pain, nausea and vomiting for a week, episode of coffee-ground emesis with known history of gastric ulcer -Hemoglobin normal.  Continue to assess -Patient saw her gastroenterologist, Dr. Bonna Gains on 10/27/2019 with plans for EGD given that she had ongoing abdominal pain -Continue PPI -Consider GI consult in the a.m.  Atypical chest pain History of CAD -Patient presents with atypical chest pain, troponin 4, EKG nonacute -Chest pain mostly atypical -Continue to monitor -Continue home nitroglycerin sublingual as needed chest pain, continue aspirin and metoprolol    Acute respiratory failure with hypoxia (HCC) -O2 sat 86% on room air in the ER, etiology uncertain, possible splinting due to chest/abdominal pain -CTA chest negative for PE.  Covid PCR negative -Supplemental O2 to keep sats in  the mid 90s -   Lactic acidosis -Possibly related to recent vomiting.  No other signs to suspect sepsis    AKI (acute kidney injury) ([place]) -Creatinine 1.08, above baseline of 0.73 -Likely prerenal related to recent vomiting -Continue IV fluids     Hypertension goal BP (blood pressure) < 140/90 -Continue home meds    Subclinical hypothyroidism -Continue home meds    DVT prophylaxis: SCDs Code Status: full code  Family Communication:  none  Disposition Plan: Back to previous home environment Consults called: GI Status:obs    Athena Masse MD [facility]     11/03/2019, 9:47 PM

## 2019-11-03 NOTE — Telephone Encounter (Signed)
Patient called stating that she continues to not feeling well with left side chest again, abdominal pain, nauseated and vomiting, body aches and feverish but no fever. Patient went to see Dr. Jefm Bryant yesterday and did some labs, one of them being C Reactive Protein and it was elevated. Patient stated that she knows that she has to have an intestinal infection and is scared of getting sepsis again. I told her that I would call Dr. Scharlene Gloss office to see if they would contact her in reference to her lab results. I don't know what else to tell her. Please advice.

## 2019-11-03 NOTE — ED Notes (Signed)
Dr. Duncan to bedside 

## 2019-11-03 NOTE — Chronic Care Management (AMB) (Signed)
  Chronic Care Management     11/03/2019 Name: Linda Mejia MRN: [id number] DOB: Feb 01, 1956  Primary Care Provider: Towanda Malkin, MD    Returned call to Ms. Linda Mejia. She is a 64 y.o. year old female who is a primary care patient of Towanda Malkin, MD.  She reports "feeling very sick" today. States " I know something is wrong."    She reports episodes of chest pain, shortness of breath, generalized body aches, weakness and nausea. Reports being "hot and cold" and experiencing significant pain during the night. She reports taking nitroglycerin with some relief.   Reports vomiting  yesterday and the day before. States she able to eat a small meal today. States she did not vomit today but described last episode as "dark colored vomit with coffee grounds"   She expressed concerns that she was previously hospitalized for sepsis. Also indicated that she was recently evaluated for chest pain and has not felt well for several days. Indicated that she was too dizzy and lightheaded to drive to the emergency room. Her daughter lives in the home but is currently at work. Emergency services contacted. Anticipating transport to [place] care management team will update her primary care provider. -Follow-up pending discharge disposition.   [facility] [phone]

## 2019-11-03 NOTE — ED Triage Notes (Signed)
Pt to the er for chest pain, vomiting x 2 days, with coffee ground emesis. Pt has multiple complaints. Pt c/o wheezing, pain behind left breast, back pain, and stomach ache. Pt c/o pain everywhere. Last BM was this am.

## 2019-11-03 NOTE — ED Triage Notes (Addendum)
First Nurse Note:  Arrives [facility]  C/O several week history of insomnia.  Also reports one  episode of emesis Tuesday.  4 mg Zofran given  22g right hand.  CBG:  150.  RA sat 86%  Placed on 4l/ [place] SATS improved to 95%.  Has history of COPD

## 2019-11-03 NOTE — Telephone Encounter (Signed)
Linda Manifold, MD  You 8 hours ago (11:10 AM)   The labs were ordered by rheumatology and pt would need to follow up with them to determine if they need further testing. With body aches, fever, she should discuss this with her PCP and she may need covid testing. If she is having diarrhea, order GI profile. If not, proceed with procedures as scheduled.     Called patient to let her know what Linda Mejia had recommended for patient to do and patient then told me that she was at the ED because her PCP had recommended for her to go due to all of her existing symptoms. Patient is now waiting to be admitted. I will mention it to Linda Mejia.

## 2019-11-03 NOTE — ED Notes (Signed)
Pt requesting more pain medication at this time.

## 2019-11-03 NOTE — ED Notes (Signed)
Date and time results received: 11/03/19 1631 (use smartphrase ".now" to insert current time)  Test: lactic acid Critical Value: 2.0  Name of Provider Notified: Charna Archer  Orders Received? None  Or Actions Taken?: Actions Taken: none

## 2019-11-03 NOTE — Telephone Encounter (Signed)
Called Dr. Scharlene Gloss office to let them know about the patient calling us for the labs that were ordered by Dr. Jefm Bryant. They stated that they had received a MyChart message and a phone call from patient already and that they would call her whenever he had the time to review lab results but that at this time he was seeing patients. I told them that I understood but that I was just trying to help the patient. They stated that they would call her back once labs were reviewed by the physician.

## 2019-11-03 NOTE — ED Provider Notes (Signed)
[facility] Emergency Department Provider Note   ____________________________________________   First MD Initiated Contact with Patient 11/03/19 1648     (approximate)  I have reviewed the triage vital signs and the nursing notes.   HISTORY  Chief Complaint Chest Pain    HPI Linda Mejia is a 64 y.o. female with past medical history of CAD, hypertension, pulmonary hypertension, and anxiety who presents to the ED complaining of chest pain. Patient reports that she has been dealing with intermittent sharp and burning pain in the left side of her chest since Monday. It is not exacerbated or alleviated by anything in particular can sometimes move upward in her chest but also moves into the middle of her back. She has been feeling short of breath with the symptoms but denies any fevers or cough. She has had some nausea and vomiting as well as intermittent diarrhea and endorses upper abdominal pain as well. She has not had any sick contacts and states she has been vaccinated for COVID-19. She states her vomiting, weakness, headaches, and malaise have been going on for 3 weeks but she became concerned when chest pain started this week.        Past Medical History:  Diagnosis Date  . Abnormal ankle brachial index (ABI) 12/02/2016  . ADHD (attention deficit hyperactivity disorder)   . Anxiety   . Aortic atherosclerosis ([place]) 12/02/2016   July 2018  . Chronic back pain   . Coronary artery disease   . Herpes genitalis in women   . History of stomach ulcers    x7 this year. Bleeding  . Hypertension   . Prediabetes 12/02/2016  . Pulmonary hypertension ([place]) 03/09/2018   Echo Oct 2019    Patient Active Problem List   Diagnosis Date Noted  . Acute respiratory failure with hypoxia ([place]) 11/03/2019  . Lactic acidosis 11/03/2019  . AKI (acute kidney injury) ([place]) 11/03/2019  . Malaise 11/03/2019  . Cellulitis 01/28/2019  . 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 ANA positive 09/22/2018  . 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 Tenosynovitis of hand 09/22/2018  . Breast mass, right 06/14/2018  . Acute peptic ulcer of stomach   . Stomach irritation   . Abdominal pain, epigastric   . Acute gastric ulcer due to Helicobacter pylori   . Pulmonary hypertension ([place]) 03/09/2018  . Unstable angina ([place]) 01/23/2018  . Medication monitoring encounter 03/18/2017  . Colon cancer screening 03/18/2017  . Coronary artery calcification seen on CT scan 12/02/2016  . Aortic atherosclerosis ([place]) 12/02/2016  . Abnormal ankle brachial index (ABI) 12/02/2016  . Prediabetes 12/02/2016  . Fracture of rib 08/28/2016  . Chest pain 08/11/2016  . Hx of tobacco use, presenting hazards to health 06/09/2016  . Degenerative arthritis of knee, bilateral 12/27/2015  . GI bleed 09/21/2015  . Reflux esophagitis   . Nausea and vomiting   . Polyarthralgia 06/05/2015  . Night sweats 06/05/2015  . Headache 04/12/2015  . Subclinical hypothyroidism 03/26/2015  . Herpes simplex type 2 infection 03/21/2015  . Rhinitis, allergic 03/21/2015  . ADHD (attention deficit hyperactivity disorder), inattentive type 09/20/2014  . Episodic paroxysmal anxiety disorder 09/20/2014  . History of gastric ulcer 09/20/2014  . Substance abuse ([place]) 09/20/2014  . HLD (hyperlipidemia) 10/03/2009  . Coronary artery disease involving native coronary artery of native heart with angina pectoris ([place]) 10/02/2009  . Hypertension goal BP (blood pressure) < 140/90 10/02/2009  Past Surgical History:  Procedure Laterality Date  . APPENDECTOMY    . CARDIAC CATHETERIZATION    . CESAREAN SECTION     x2  . COLON SURGERY     interception  . ESOPHAGOGASTRODUODENOSCOPY (EGD) WITH PROPOFOL N/A 09/21/2015   Procedure: ESOPHAGOGASTRODUODENOSCOPY (EGD) WITH PROPOFOL;  Surgeon: Lucilla Lame, MD;  Location: [facility] ENDOSCOPY;  Service: Endoscopy;  Laterality: N/A;   . ESOPHAGOGASTRODUODENOSCOPY (EGD) WITH PROPOFOL N/A 07/30/2016   Procedure: ESOPHAGOGASTRODUODENOSCOPY (EGD) WITH PROPOFOL;  Surgeon: Jonathon Bellows, MD;  Location: [facility] ENDOSCOPY;  Service: Endoscopy;  Laterality: N/A;  . ESOPHAGOGASTRODUODENOSCOPY (EGD) WITH PROPOFOL N/A 04/23/2018   Procedure: ESOPHAGOGASTRODUODENOSCOPY (EGD) WITH PROPOFOL;  Surgeon: Virgel Manifold, MD;  Location: [facility] ENDOSCOPY;  Service: Endoscopy;  Laterality: N/A;  . LEFT HEART CATH Right 01/25/2018   Procedure: Left Heart Cath and Coronary Angiography;  Surgeon: Dionisio David, MD;  Location: [place] [facility];  Service: Cardiovascular;  Laterality: Right;  . LEFT HEART CATH AND CORONARY ANGIOGRAPHY Right 08/12/2016   Procedure: Left Heart Cath and Coronary Angiography;  Surgeon: Dionisio David, MD;  Location: [place] [facility];  Service: Cardiovascular;  Laterality: Right;  . TONSILLECTOMY    . TUBAL LIGATION      Prior to Admission medications   Medication Sig Start Date End Date Taking? Authorizing Provider  acyclovir (ZOVIRAX) 400 MG tablet Take 1 tablet (400 mg total) by mouth 2 (two) times daily. 02/15/19  Yes Hubbard Hartshorn, FNP  Alirocumab (PRALUENT) 75 MG/ML SOAJ Inject 1 each into the skin every 14 (fourteen) days. 12/09/18  Yes Hubbard Hartshorn, FNP  amphetamine-dextroamphetamine (ADDERALL) 30 MG tablet Take 30 mg by mouth 2 (two) times daily.  12/02/16  Yes Lada, Satira Anis, MD  aspirin EC 81 MG tablet Take 81 mg by mouth daily.   Yes [provider]  cyanocobalamin 1000 MCG tablet Take 1,000 mcg by mouth daily.   Yes [provider]  diazepam (VALIUM) 10 MG tablet Take 1 tablet (10 mg total) by mouth every 8 (eight) hours as needed for anxiety. 09/22/15  Yes Fritzi Mandes, MD  metoprolol tartrate (LOPRESSOR) 50 MG tablet TAKE 1 TABLET BY MOUTH TWICE DAILY Patient taking differently: Take 50 mg by mouth 2 (two) times daily.  08/07/19  Yes Hubbard Hartshorn, FNP  nitroGLYCERIN (NITROSTAT) 0.4 MG  SL tablet Place 1 tablet (0.4 mg total) under the tongue every 5 (five) minutes as needed for chest pain. 01/26/18  Yes Mody, Ulice Bold, MD  pantoprazole (PROTONIX) 40 MG tablet Take 1 tablet (40 mg total) by mouth daily. 10/27/19  Yes [name] B, MD  predniSONE (DELTASONE) 5 MG tablet Take 5-30 mg by mouth daily. 11/02/19  Yes [provider]  fluticasone (FLONASE) 50 MCG/ACT nasal spray Place 2 sprays into both nostrils daily as needed for allergies. 02/08/19   Hubbard Hartshorn, FNP  nystatin (MYCOSTATIN/NYSTOP) powder Apply topically 2 (two) times daily. Patient taking differently: Apply topically as needed (rash).  05/25/18   Dustin Flock, MD    Allergies Amlodipine  Family History  Problem Relation Age of Onset  . Diabetes Mother   . CVA Mother   . Heart disease Father   . Heart disease Sister   . Breast cancer Sister   . Heart disease Brother   . Heart disease Sister   . Heart disease Sister   . Heart disease Sister   . Heart disease Brother   . Heart disease Brother   . Heart disease  Brother   . Heart disease Brother   . Healthy Daughter   . Healthy Daughter     Social History Social History   Tobacco Use  . Smoking status: Former Smoker    Packs/day: 1.00    Years: 30.00    Pack years: 30.00    Types: Cigarettes    Quit date: 2006    Years since quitting: 15.5  . Smokeless tobacco: Never Used  Vaping Use  . Vaping Use: Never used  Substance Use Topics  . Alcohol use: Yes  . Drug use: No    Review of Systems  Constitutional: No fever/chills. Positive for generalized weakness and malaise. Eyes: No visual changes. ENT: No sore throat. Cardiovascular: Positive for chest pain. Respiratory: Positive for shortness of breath. Gastrointestinal: Positive for abdominal pain, nausea, vomiting, and diarrhea.  No constipation. Genitourinary: Negative for dysuria. Musculoskeletal: Negative for back pain. Skin: Negative for rash. Neurological: Negative  for headaches, focal weakness or numbness.  ____________________________________________   PHYSICAL EXAM:  VITAL SIGNS: ED Triage Vitals  Enc Vitals Group     BP 11/03/19 1534 (!) 168/92     Pulse Rate 11/03/19 1534 (!) 123     Resp 11/03/19 1534 19     Temp 11/03/19 1534 99.1 F (37.3 C)     Temp Source 11/03/19 1534 Oral     SpO2 11/03/19 1534 96 %     Weight 11/03/19 1538 (!) 219 lb (99.3 kg)     Height 11/03/19 1538 5\' 5"  (1.651 m)     Head Circumference --      Peak Flow --      Pain Score 11/03/19 1546 2     Pain Loc --      Pain Edu? --      Excl. in [place]? --     Constitutional: Alert and oriented. Eyes: Conjunctivae are normal. Head: Atraumatic. Nose: No congestion/rhinnorhea. Mouth/Throat: Mucous membranes are moist. Neck: Normal ROM Cardiovascular: Tachycardic, regular rhythm. Grossly normal heart sounds. 2+ radial pulses bilaterally. Respiratory: Normal respiratory effort.  No retractions. Lungs CTAB. Gastrointestinal: Soft and tender to palpation in the epigastrium. No distention. Genitourinary: deferred Musculoskeletal: No lower extremity tenderness nor edema. Neurologic:  Normal speech and language. No gross focal neurologic deficits are appreciated. Skin:  Skin is warm, dry and intact. No rash noted. Psychiatric: Mood and affect are normal. Speech and behavior are normal.  ____________________________________________   LABS (all labs ordered are listed, but only abnormal results are displayed)  Labs Reviewed  LACTIC ACID, PLASMA - Abnormal; Notable for the following components:      Result Value   Lactic Acid, Venous 2.0 (*)    All other components within normal limits  BASIC METABOLIC PANEL - Abnormal; Notable for the following components:   Glucose, Bld 153 (*)    Creatinine, Ser 1.08 (*)    Calcium 8.7 (*)    GFR calc non Af Amer 55 (*)    All other components within normal limits  SARS CORONAVIRUS 2 BY RT PCR (HOSPITAL ORDER, [place] [facility])  LACTIC ACID, PLASMA  CBC WITH DIFFERENTIAL/PLATELET  TSH  LIPASE, BLOOD  HEPATIC FUNCTION PANEL  URINALYSIS, COMPLETE (UACMP) WITH MICROSCOPIC  BASIC METABOLIC PANEL  CBC  TROPONIN I (HIGH SENSITIVITY)   ____________________________________________  EKG  ED ECG REPORT I, Blake Divine, the attending physician, personally viewed and interpreted this ECG.   Date: 11/03/2019  EKG Time: 15:40  Rate: 126  Rhythm: sinus tachycardia  Axis: Normal  Intervals:none  ST&T Change: Nonspecific ST/T changes   PROCEDURES  Procedure(s) performed (including Critical Care):  Procedures   ____________________________________________   INITIAL IMPRESSION / ASSESSMENT AND PLAN / ED COURSE       64 year old female with past medical history of CAD, hypertension, pulmonary hypertension, and anxiety who presents to the ED complaining of generalized weakness, malaise, muscle aches for the past couple of weeks but developed chest pain earlier this week.  Symptoms sound atypical for ACS and EKG with no acute ischemic changes.  Patient noted to be hypoxic on room air to 88%, subsequently placed on 2 L nasal cannula with improvement.  CTA was performed and is negative for PE, but shows possible small airway disease.  Covid testing is negative, CT was also performed of abdomen/pelvis and negative for acute process.  Etiology of patient's hypoxia is unclear, case discussed with hospitalist for admission and for further work-up.      ____________________________________________   FINAL CLINICAL IMPRESSION(S) / ED DIAGNOSES  Final diagnoses:  Acute respiratory failure with hypoxia ([place])  Nonspecific chest pain     ED Discharge Orders    None       Note:  This document was prepared using Dragon voice recognition software and may include unintentional dictation errors.   Blake Divine, MD 11/03/19 [phone]

## 2019-11-03 NOTE — ED Notes (Addendum)
This RN at bedside. Pt oxygen sats 88% and sustained. Pt denies increased SOB. Pt placed on 2L [place] for comfort. Sats improved to 95-96% on 2L [place]. Pt also stating she wants something stronger than tylenol for her back pain. MD and pt's RN made aware.

## 2019-11-04 ENCOUNTER — Observation Stay: Payer: Medicare Other

## 2019-11-04 ENCOUNTER — Encounter
Admission: EM | Disposition: A | Discharge status: Home / Self Care | Payer: Self-pay | Source: Home / Self Care | Attending: Family Medicine

## 2019-11-04 ENCOUNTER — Observation Stay: Payer: Medicare Other | Admitting: Anesthesiology

## 2019-11-04 ENCOUNTER — Encounter: Payer: Self-pay | Admitting: Internal Medicine

## 2019-11-04 DIAGNOSIS — R140 Abdominal distension (gaseous): Secondary | ICD-10-CM | POA: Diagnosis not present

## 2019-11-04 DIAGNOSIS — Z8249 Family history of ischemic heart disease and other diseases of the circulatory system: Secondary | ICD-10-CM | POA: Diagnosis not present

## 2019-11-04 DIAGNOSIS — R112 Nausea with vomiting, unspecified: Secondary | ICD-10-CM | POA: Diagnosis present

## 2019-11-04 DIAGNOSIS — K2100 Gastro-esophageal reflux disease with esophagitis, without bleeding: Secondary | ICD-10-CM | POA: Diagnosis present

## 2019-11-04 DIAGNOSIS — Z20822 Contact with and (suspected) exposure to covid-19: Secondary | ICD-10-CM | POA: Diagnosis present

## 2019-11-04 DIAGNOSIS — K2980 Duodenitis without bleeding: Secondary | ICD-10-CM | POA: Diagnosis present

## 2019-11-04 DIAGNOSIS — E038 Other specified hypothyroidism: Secondary | ICD-10-CM | POA: Diagnosis present

## 2019-11-04 DIAGNOSIS — R5381 Other malaise: Secondary | ICD-10-CM | POA: Diagnosis present

## 2019-11-04 DIAGNOSIS — E872 Acidosis: Secondary | ICD-10-CM | POA: Diagnosis present

## 2019-11-04 DIAGNOSIS — F909 Attention-deficit hyperactivity disorder, unspecified type: Secondary | ICD-10-CM | POA: Diagnosis present

## 2019-11-04 DIAGNOSIS — K254 Chronic or unspecified gastric ulcer with hemorrhage: Secondary | ICD-10-CM | POA: Diagnosis present

## 2019-11-04 DIAGNOSIS — R0902 Hypoxemia: Secondary | ICD-10-CM | POA: Diagnosis present

## 2019-11-04 DIAGNOSIS — R079 Chest pain, unspecified: Secondary | ICD-10-CM

## 2019-11-04 DIAGNOSIS — F419 Anxiety disorder, unspecified: Secondary | ICD-10-CM | POA: Diagnosis present

## 2019-11-04 DIAGNOSIS — K2970 Gastritis, unspecified, without bleeding: Secondary | ICD-10-CM | POA: Diagnosis present

## 2019-11-04 DIAGNOSIS — N179 Acute kidney failure, unspecified: Secondary | ICD-10-CM

## 2019-11-04 DIAGNOSIS — I272 Pulmonary hypertension, unspecified: Secondary | ICD-10-CM | POA: Diagnosis present

## 2019-11-04 DIAGNOSIS — I10 Essential (primary) hypertension: Secondary | ICD-10-CM | POA: Diagnosis present

## 2019-11-04 DIAGNOSIS — Z87891 Personal history of nicotine dependence: Secondary | ICD-10-CM | POA: Diagnosis not present

## 2019-11-04 DIAGNOSIS — Z8719 Personal history of other diseases of the digestive system: Secondary | ICD-10-CM | POA: Diagnosis not present

## 2019-11-04 DIAGNOSIS — I25119 Atherosclerotic heart disease of native coronary artery with unspecified angina pectoris: Secondary | ICD-10-CM | POA: Diagnosis present

## 2019-11-04 DIAGNOSIS — Z8619 Personal history of other infectious and parasitic diseases: Secondary | ICD-10-CM | POA: Diagnosis not present

## 2019-11-04 DIAGNOSIS — Z888 Allergy status to other drugs, medicaments and biological substances status: Secondary | ICD-10-CM | POA: Diagnosis not present

## 2019-11-04 DIAGNOSIS — Z9049 Acquired absence of other specified parts of digestive tract: Secondary | ICD-10-CM | POA: Diagnosis not present

## 2019-11-04 DIAGNOSIS — R1012 Left upper quadrant pain: Secondary | ICD-10-CM | POA: Diagnosis not present

## 2019-11-04 DIAGNOSIS — G8929 Other chronic pain: Secondary | ICD-10-CM

## 2019-11-04 DIAGNOSIS — J9601 Acute respiratory failure with hypoxia: Secondary | ICD-10-CM | POA: Diagnosis present

## 2019-11-04 DIAGNOSIS — Z9861 Coronary angioplasty status: Secondary | ICD-10-CM | POA: Diagnosis not present

## 2019-11-04 DIAGNOSIS — K589 Irritable bowel syndrome without diarrhea: Secondary | ICD-10-CM | POA: Diagnosis present

## 2019-11-04 DIAGNOSIS — M549 Dorsalgia, unspecified: Secondary | ICD-10-CM | POA: Diagnosis present

## 2019-11-04 HISTORY — PX: ESOPHAGOGASTRODUODENOSCOPY (EGD) WITH PROPOFOL: SHX5813

## 2019-11-04 LAB — BASIC METABOLIC PANEL
Anion gap: 11 (ref 5–15)
BUN: 17 mg/dL (ref 8–23)
CO2: 27 mmol/L (ref 22–32)
Calcium: 8.6 mg/dL — ABNORMAL LOW (ref 8.9–10.3)
Chloride: 104 mmol/L (ref 98–111)
Creatinine, Ser: 0.76 mg/dL (ref 0.44–1.00)
GFR calc Af Amer: >60 mL/min (ref >60)
GFR calc non Af Amer: >60 mL/min (ref >60)
Glucose, Bld: 141 mg/dL — ABNORMAL HIGH (ref 70–99)
Potassium: 4.2 mmol/L (ref 3.5–5.1)
Sodium: 142 mmol/L (ref 135–145)

## 2019-11-04 LAB — CBC
HCT: 39.7 % (ref 36.0–46.0)
Hemoglobin: 12.3 g/dL (ref 12.0–15.0)
MCH: 31.9 pg (ref 26.0–34.0)
MCHC: 31 g/dL (ref 30.0–36.0)
MCV: 103.1 fL — ABNORMAL HIGH (ref 80.0–100.0)
Platelets: 301 10*3/uL (ref 150–400)
RBC: 3.85 MIL/uL — ABNORMAL LOW (ref 3.87–5.11)
RDW: 13.2 % (ref 11.5–15.5)
WBC: 9.1 10*3/uL (ref 4.0–10.5)
nRBC: 0 % (ref 0.0–0.2)

## 2019-11-04 LAB — GLUCOSE, CAPILLARY: Glucose-Capillary: 106 mg/dL — ABNORMAL HIGH (ref 70–99)

## 2019-11-04 SURGERY — ESOPHAGOGASTRODUODENOSCOPY (EGD) WITH PROPOFOL
Anesthesia: General

## 2019-11-04 MED ORDER — PROPOFOL 500 MG/50ML IV EMUL
INTRAVENOUS | Status: DC | PRN
  Administered 2019-11-04: 150 ug/kg/min via INTRAVENOUS

## 2019-11-04 MED ORDER — LIDOCAINE HCL (CARDIAC) PF 100 MG/5ML IV SOSY
PREFILLED_SYRINGE | INTRAVENOUS | Status: DC | PRN
  Administered 2019-11-04: 50 mg via INTRAVENOUS

## 2019-11-04 MED ORDER — PROPOFOL 10 MG/ML IV BOLUS
INTRAVENOUS | Status: DC | PRN
  Administered 2019-11-04: 100 mg via INTRAVENOUS

## 2019-11-04 MED ORDER — SODIUM CHLORIDE 0.9 % IV SOLN: INTRAVENOUS | Status: DC

## 2019-11-04 NOTE — Anesthesia Preprocedure Evaluation (Addendum)
Anesthesia Evaluation  Patient identified by MRN, date of birth, ID band Patient awake    Reviewed: Allergy & Precautions, NPO status , Patient's Chart, lab work & pertinent test results  History of Anesthesia Complications Negative for: history of anesthetic complications  Airway Mallampati: III       Dental  (+) Upper Dentures, Dental Advidsory Given, Missing, Poor Dentition   Pulmonary shortness of breath and with exertion, neg sleep apnea, COPD, neg recent URI, former smoker,           Cardiovascular hypertension, Pt. on medications (-) angina+ CAD, + Past MI and + Cardiac Stents  (-) CABG and (-) CHF (-) dysrhythmias + Valvular Problems/Murmurs MR   TTE 2020: 1. The left ventricle has normal systolic function, with an ejection  fraction of 55-60%. The cavity size was normal. There is mildly increased  left ventricular wall thickness. Left ventricular diastolic parameters  were normal.  2. The right ventricle has normal systolic function. The cavity was  normal. There is no increase in right ventricular wall thickness.  3. Left atrial size was moderately dilated.  4. The mitral valve is rheumatic. Moderate thickening of the mitral valve  leaflet. Mild calcification of the mitral valve leaflet. Mitral valve  regurgitation is severe by color flow Doppler.  5. The tricuspid valve is normal in structure. Tricuspid valve  regurgitation is mild-moderate.  6. The aortic valve is normal in structure. Aortic valve regurgitation is  trivial by color flow Doppler.  7. Right atrial pressure is estimated at 10 mmHg.    Neuro/Psych neg Seizures PSYCHIATRIC DISORDERS Anxiety    GI/Hepatic Neg liver ROS, PUD, GERD  ,  Endo/Other  neg diabetes  Renal/GU negative Renal ROS     Musculoskeletal   Abdominal   Peds  Hematology   Anesthesia Other Findings Past Medical History: 12/02/2016: Abnormal ankle brachial index  (ABI) No date: ADHD (attention deficit hyperactivity disorder) No date: Anxiety 12/02/2016: Aortic atherosclerosis ([place])     Comment:  July 2018 No date: Chronic back pain No date: Coronary artery disease No date: Herpes genitalis in women No date: History of stomach ulcers     Comment:  x7 this year. Bleeding No date: Hypertension 12/02/2016: Prediabetes 03/09/2018: Pulmonary hypertension ([place])     Comment:  Echo Oct 2019   Reproductive/Obstetrics negative OB ROS                            Anesthesia Physical  Anesthesia Plan  ASA: III  Anesthesia Plan: General   Post-op Pain Management:    Induction: Intravenous  PONV Risk Score and Plan: 3 and Propofol infusion, TIVA and Treatment may vary due to age or medical condition  Airway Management Planned: Nasal Cannula and Natural Airway  Additional Equipment:   Intra-op Plan:   Post-operative Plan:   Informed Consent: I have reviewed the patients History and Physical, chart, labs and discussed the procedure including the risks, benefits and alternatives for the proposed anesthesia with the patient or authorized representative who has indicated his/her understanding and acceptance.       Plan Discussed with:   Anesthesia Plan Comments:         Anesthesia Quick Evaluation

## 2019-11-04 NOTE — Anesthesia Postprocedure Evaluation (Signed)
Anesthesia Post Note  Patient: Linda Mejia  Procedure(s) Performed: ESOPHAGOGASTRODUODENOSCOPY (EGD) WITH PROPOFOL (N/A )  Patient location during evaluation: Endoscopy Anesthesia Type: General Level of consciousness: awake and alert Pain management: pain level controlled Vital Signs Assessment: post-procedure vital signs reviewed and stable Respiratory status: spontaneous breathing, nonlabored ventilation, respiratory function stable and patient connected to nasal cannula oxygen Cardiovascular status: blood pressure returned to baseline and stable Postop Assessment: no apparent nausea or vomiting Anesthetic complications: no   No complications documented.   Last Vitals:  Vitals:   11/04/19 1411 11/04/19 1534  BP: (!) 140/79 114/65  Pulse: 67 71  Resp: 18 13  Temp: (!) 35.8 C (!) 36.1 C  SpO2: 100% 97%    Last Pain:  Vitals:   11/04/19 1534  TempSrc: Temporal  PainSc: 5                  [name]

## 2019-11-04 NOTE — Consult Note (Signed)
[name], MD [address]  [place], [place] [address]  Main: [phone]  Fax: [phone] Pager: [phone]   Consultation  Referring Provider:     No ref. provider found Primary Care Physician:  Towanda Malkin, MD Primary Gastroenterologist:  Dr. Bonna Gains         Reason for Consultation:     Abdominal pain, nausea, vomiting  Date of Admission:  11/03/2019 Date of Consultation:  11/04/2019         HPI:   Linda Mejia is a 64 y.o. female known history of peptic ulcer disease, H. pylori infection, s/p treatment with triple therapy, was recently evaluated by Dr. Bonna Gains as outpatient on 7/15 for epigastric pain, GERD, constipation.  The plan was to undergo upper endoscopy for intestinal metaplasia with gastric mapping as well as evaluate for any recurrent peptic ulcer disease and colonoscopy for screening.  However, due to worsening of symptoms, patient presented to ER yesterday as recommended by her PCP.  She is also undergoing rheumatologic work-up per Dr. Minda Meo elevated ANA and was started empirically on prednisone.  Patient has several upper endoscopies within last 3 years. Patient was hypoxic in the ER, improved with supplemental oxygen at 2 L/min, hemoglobin of 13, normal BUN/creatinine GI is consulted for upper endoscopy as patient is admitted Patient is kept n.p.o., she is c/o LUQ pain, would not want to go home unless the cause of her pain is figured out  NSAIDs: None  Antiplts/Anticoagulants/Anti thrombotics: None  GI Procedures:  Last EGD April 2018 by Dr. Vicente Males for hematemesis 3 nonbleeding gastric antral ulcers reported, largest 6 mm in size Gastritis and duodenitis reported  June 2017 EGD by Dr. Allen Norris for coffee-ground emesis Gastric antrum erosion, gastric erythema, duodenal erythema, grade a esophagitis reported. Hiatal hernia was reported  March 2016 EGD by Dr. Gustavo Lah Gastric ulcers reported at that time as  well  Colonoscopy 2005 for hematochezia and diarrhea Normal terminal ileum, normal colon reported. Impression was IBS  Past Medical History:  Diagnosis Date  . Abnormal ankle brachial index (ABI) 12/02/2016  . ADHD (attention deficit hyperactivity disorder)   . Anxiety   . Aortic atherosclerosis ([place]) 12/02/2016   July 2018  . Chronic back pain   . Coronary artery disease   . Herpes genitalis in women   . History of stomach ulcers    x7 this year. Bleeding  . Hypertension   . Prediabetes 12/02/2016  . Pulmonary hypertension ([place]) 03/09/2018   Echo Oct 2019    Past Surgical History:  Procedure Laterality Date  . APPENDECTOMY    . CARDIAC CATHETERIZATION    . CESAREAN SECTION     x2  . COLON SURGERY     interception  . CORONARY ANGIOPLASTY     stents...  2000  . ESOPHAGOGASTRODUODENOSCOPY (EGD) WITH PROPOFOL N/A 09/21/2015   Procedure: ESOPHAGOGASTRODUODENOSCOPY (EGD) WITH PROPOFOL;  Surgeon: Lucilla Lame, MD;  Location: [facility];  Service: Endoscopy;  Laterality: N/A;  . ESOPHAGOGASTRODUODENOSCOPY (EGD) WITH PROPOFOL N/A 07/30/2016   Procedure: ESOPHAGOGASTRODUODENOSCOPY (EGD) WITH PROPOFOL;  Surgeon: Jonathon Bellows, MD;  Location: [facility];  Service: Endoscopy;  Laterality: N/A;  . ESOPHAGOGASTRODUODENOSCOPY (EGD) WITH PROPOFOL N/A 04/23/2018   Procedure: ESOPHAGOGASTRODUODENOSCOPY (EGD) WITH PROPOFOL;  Surgeon: Virgel Manifold, MD;  Location: [facility];  Service: Endoscopy;  Laterality: N/A;  . LEFT HEART CATH Right 01/25/2018   Procedure: Left Heart Cath and Coronary Angiography;  Surgeon: Dionisio David, MD;  Location: [place] [facility];  Service: Cardiovascular;  Laterality: Right;  . LEFT HEART CATH AND CORONARY ANGIOGRAPHY Right 08/12/2016   Procedure: Left Heart Cath and Coronary Angiography;  Surgeon: Dionisio David, MD;  Location: [place] [facility];  Service: Cardiovascular;  Laterality: Right;  . TONSILLECTOMY    . TUBAL LIGATION      Prior to  Admission medications   Medication Sig Start Date End Date Taking? Authorizing Provider  acyclovir (ZOVIRAX) 400 MG tablet Take 1 tablet (400 mg total) by mouth 2 (two) times daily. 02/15/19  Yes Hubbard Hartshorn, FNP  Alirocumab (PRALUENT) 75 MG/ML SOAJ Inject 1 each into the skin every 14 (fourteen) days. 12/09/18  Yes Hubbard Hartshorn, FNP  amphetamine-dextroamphetamine (ADDERALL) 30 MG tablet Take 30 mg by mouth 2 (two) times daily.  12/02/16  Yes Lada, Satira Anis, MD  aspirin EC 81 MG tablet Take 81 mg by mouth daily.   Yes [provider]  cyanocobalamin 1000 MCG tablet Take 1,000 mcg by mouth daily.   Yes [provider]  diazepam (VALIUM) 10 MG tablet Take 1 tablet (10 mg total) by mouth every 8 (eight) hours as needed for anxiety. 09/22/15  Yes Fritzi Mandes, MD  metoprolol tartrate (LOPRESSOR) 50 MG tablet TAKE 1 TABLET BY MOUTH TWICE DAILY Patient taking differently: Take 50 mg by mouth 2 (two) times daily.  08/07/19  Yes Hubbard Hartshorn, FNP  nitroGLYCERIN (NITROSTAT) 0.4 MG SL tablet Place 1 tablet (0.4 mg total) under the tongue every 5 (five) minutes as needed for chest pain. 01/26/18  Yes Mody, Ulice Bold, MD  pantoprazole (PROTONIX) 40 MG tablet Take 1 tablet (40 mg total) by mouth daily. 10/27/19  Yes [name] B, MD  predniSONE (DELTASONE) 5 MG tablet Take 5-30 mg by mouth daily. 11/02/19  Yes [provider]  fluticasone (FLONASE) 50 MCG/ACT nasal spray Place 2 sprays into both nostrils daily as needed for allergies. 02/08/19   Hubbard Hartshorn, FNP  nystatin (MYCOSTATIN/NYSTOP) powder Apply topically 2 (two) times daily. Patient taking differently: Apply topically as needed (rash).  05/25/18   Dustin Flock, MD    Current Facility-Administered Medications:  .  0.9 %  sodium chloride infusion, , Intravenous, Continuous, Athena Masse, MD, Stopped at 11/04/19 [phone] .  0.9 %  sodium chloride infusion, , Intravenous, Continuous, Aundrea Higginbotham, Tally Due, MD .  Doug Sou  Hold] acetaminophen (TYLENOL) tablet 650 mg, 650 mg, Oral, Q6H PRN **OR** [MAR Hold] acetaminophen (TYLENOL) suppository 650 mg, 650 mg, Rectal, Q6H PRN, Athena Masse, MD .  Doug Sou Hold] amphetamine-dextroamphetamine (ADDERALL) tablet 30 mg, 30 mg, Oral, BID, Athena Masse, MD .  Doug Sou Hold] aspirin EC tablet 81 mg, 81 mg, Oral, Daily, Athena Masse, MD .  Doug Sou Hold] diazepam (VALIUM) tablet 10 mg, 10 mg, Oral, Q8H PRN, Athena Masse, MD, 10 mg at 11/03/19 2315 .  [MAR Hold] HYDROcodone-acetaminophen (NORCO/VICODIN) 5-325 MG per tablet 1-2 tablet, 1-2 tablet, Oral, Q4H PRN, Athena Masse, MD, 2 tablet at 11/04/19 1045 .  [MAR Hold] iohexol (OMNIPAQUE) 300 MG/ML solution 75 mL, 75 mL, Intravenous, Once PRN, Blake Divine, MD .  Doug Sou Hold] metoprolol tartrate (LOPRESSOR) tablet 50 mg, 50 mg, Oral, BID, Athena Masse, MD, 50 mg at 11/03/19 2316 .  [MAR Hold] morphine 2 MG/ML injection 2 mg, 2 mg, Intravenous, Q2H PRN, Athena Masse, MD, 2 mg at 11/04/19 0610 .  [MAR Hold] nitroGLYCERIN (NITROSTAT) SL tablet 0.4 mg, 0.4  mg, Sublingual, Q5 min PRN, Athena Masse, MD .  Doug Sou Hold] ondansetron [facility]) tablet 4 mg, 4 mg, Oral, Q6H PRN **OR** [MAR Hold] ondansetron (ZOFRAN) injection 4 mg, 4 mg, Intravenous, Q6H PRN, Athena Masse, MD .  Doug Sou Hold] pantoprazole (PROTONIX) EC tablet 40 mg, 40 mg, Oral, Daily, Athena Masse, MD  Family History  Problem Relation Age of Onset  . Diabetes Mother   . CVA Mother   . Heart disease Father   . Heart disease Sister   . Breast cancer Sister   . Heart disease Brother   . Heart disease Sister   . Heart disease Sister   . Heart disease Sister   . Heart disease Brother   . Heart disease Brother   . Heart disease Brother   . Heart disease Brother   . Healthy Daughter   . Healthy Daughter      Social History   Tobacco Use  . Smoking status: Former Smoker    Packs/day: 1.00    Years: 30.00    Pack years: 30.00    Types: Cigarettes     Quit date: 2006    Years since quitting: 15.5  . Smokeless tobacco: Never Used  Vaping Use  . Vaping Use: Never used  Substance Use Topics  . Alcohol use: Yes  . Drug use: No    Allergies as of 11/03/2019 - Review Complete 11/03/2019  Allergen Reaction Noted  . Amlodipine  10/24/2019    Review of Systems:    All systems reviewed and negative except where noted in HPI.   Physical Exam:  Vital signs in last 24 hours: Temp:  [96.5 F (35.8 C)-99.1 F (37.3 C)] 96.5 F (35.8 C) (07/23 1411) Pulse Rate:  [67-123] 67 (07/23 1411) Resp:  [9-25] 18 (07/23 1411) BP: (116-168)/(72-114) 140/79 (07/23 1411) SpO2:  [89 %-100 %] 100 % (07/23 1411) Weight:  [99.3 kg] 99.3 kg (07/22 1538)   General:   Pleasant, cooperative in NAD Head:  Normocephalic and atraumatic. Eyes:   No icterus.   Conjunctiva pink. PERRLA. Ears:  Normal auditory acuity. Neck:  Supple; no masses or thyroidomegaly Lungs: Respirations even and unlabored. Lungs clear to auscultation bilaterally.   No wheezes, crackles, or rhonchi.  Heart:  Regular rate and rhythm;  Without murmur, clicks, rubs or gallops Abdomen:  Soft, nondistended, nontender. Normal bowel sounds. No appreciable masses or hepatomegaly.  No rebound or guarding.  Rectal:  Not performed. Msk:  Symmetrical without gross deformities.  Strength normal  Extremities:  Without edema, cyanosis or clubbing. Neurologic:  Alert and oriented x3;  grossly normal neurologically. Skin:  Intact without significant lesions or rashes. Psych:  Alert and cooperative. Normal affect.  LAB RESULTS: CBC Latest Ref Rng & Units 11/04/2019 11/03/2019 10/24/2019  WBC 4.0 - 10.5 K/uL 9.1 8.7 7.4  Hemoglobin 12.0 - 15.0 g/dL 12.3 13.3 13.3  Hematocrit 36 - 46 % 39.7 40.6 39.4  Platelets 150 - 400 K/uL 301 343 324    BMET BMP Latest Ref Rng & Units 11/04/2019 11/03/2019 10/24/2019  Glucose 70 - 99 mg/dL 141(H) 153(H) 108(H)  BUN 8 - 23 mg/dL '17 22 14  ' Creatinine 0.44 - 1.00  mg/dL 0.76 1.08(H) 0.62  BUN/Creat Ratio 6 - 22 (calc) - - -  Sodium 135 - 145 mmol/L 142 139 137  Potassium 3.5 - 5.1 mmol/L 4.2 4.0 3.8  Chloride 98 - 111 mmol/L 104 103 102  CO2 22 - 32 mmol/L 27 25 25  Calcium 8.9 - 10.3 mg/dL 8.6(L) 8.7(L) 8.9    LFT Hepatic Function Latest Ref Rng & Units 11/03/2019 10/24/2019 12/06/2018  Total Protein 6.5 - 8.1 g/dL 7.4 7.7 7.4  Albumin 3.5 - 5.0 g/dL 4.0 4.2 -  AST 15 - 41 U/L '28 21 20  ' ALT 0 - 44 U/L '18 16 12  ' Alk Phosphatase 38 - 126 U/L 63 76 -  Total Bilirubin 0.3 - 1.2 mg/dL 0.5 0.5 0.3  Bilirubin, Direct 0.0 - 0.2 mg/dL <0.1 - -     STUDIES: DG Chest 2 View  Result Date: 11/03/2019 CLINICAL DATA:  Chest pain, vomiting, coffee-ground emesis EXAM: CHEST - 2 VIEW COMPARISON:  10/24/2019 FINDINGS: Minimal lingular atelectasis or scarring. The lungs are otherwise clear. No pneumothorax or pleural effusion. Cardiac size within normal limits. Pulmonary vascularity normal. No acute bone abnormality. IMPRESSION: No active cardiopulmonary disease. Electronically Signed   By: Fidela Salisbury MD   On: 11/03/2019 18:05   CT Angio Chest PE W/Cm &/Or Wo Cm  Result Date: 11/03/2019 CLINICAL DATA:  Epigastric and chest pain with 2 days of emesis, cough a ground emesis, wheezing EXAM: CT ANGIOGRAPHY CHEST CT ABDOMEN AND PELVIS WITH CONTRAST TECHNIQUE: Multidetector CT imaging of the chest was performed using the standard protocol during bolus administration of intravenous contrast. Multiplanar CT image reconstructions and MIPs were obtained to evaluate the vascular anatomy. Multidetector CT imaging of the abdomen and pelvis was performed using the standard protocol during bolus administration of intravenous contrast. CONTRAST:  10m OMNIPAQUE IOHEXOL 350 MG/ML SOLN COMPARISON:  Chest CT 01/28/2019, chest radiograph 11/03/2019, CT chest, abdomen and pelvis 05/28/2018 FINDINGS: CTA CHEST FINDINGS Cardiovascular: Satisfactory opacification the pulmonary arteries to  the segmental level. No pulmonary artery filling defects are identified. Central pulmonary arteries are normal caliber. Normal heart size. No pericardial effusion. Three-vessel coronary artery calcifications are present. Atherosclerotic plaque within the normal caliber aorta. No acute luminal abnormality. No periaortic stranding or hemorrhage. Normal 3 vessel branching of the aortic arch with calcified plaque within the proximal great vessels. No major venous abnormalities. Mediastinum/Nodes: Few mildly prominent mediastinal and hilar nodes including a larger 10 mm precarinal node (2/29), and 10 mm right hilar node (2/36) are not significantly changed from prior. No new enlarged or enlarging worrisome mediastinal, hilar or axillary lymph nodes. No mediastinal fluid or gas. Normal thyroid gland and thoracic inlet. Lungs/Pleura: Respiratory motion degraded images. No consolidation, features of edema, pneumothorax, or effusion. Dependent atelectatic changes posteriorly. Some mosaic areas of attenuation in the lungs may reflect areas of air trapping or small airways disease. Musculoskeletal: No acute or worrisome osseous abnormalities in the spine. Stable mild anterior wedging at T12 unchanged from prior with superimposed Schmorl's node formations. Mild multilevel degenerative changes with slightly exaggerated thoracic kyphosis. Additional degenerative changes in the shoulders including a mineralized joint body in the medial recess of the left shoulder. No worrisome chest wall lesions. Review of the MIP images confirms the above findings. CT ABDOMEN and PELVIS FINDINGS Hepatobiliary: No worrisome focal liver lesions. Smooth liver surface contour. Normal hepatic attenuation. Gallbladder largely decompressed at the time of exam imaging with wall thickening appropriate for this degree of distention. No visible calcified gallstones or biliary ductal dilatation. No pericholecystic fluid or inflammation. Pancreas:  Unremarkable. No pancreatic ductal dilatation or surrounding inflammatory changes. Spleen: Normal in size. No concerning splenic lesions. Adrenals/Urinary Tract: Normal adrenal glands. Kidneys enhance and excrete symmetrically. No concerning renal lesions. No urolithiasis or hydronephrosis. Urinary bladder is unremarkable. Stomach/Bowel:  Distal esophagus, stomach and duodenal sweep are unremarkable. No small bowel wall thickening or dilatation. No evidence of obstruction. Small appendiceal stump/remnant in the right lower quadrant (7/33-36) with chart indicating prior appendectomy. Slight mobility of the cecum without evidence of volvulus. No colonic dilatation or wall thickening. Scattered colonic diverticula without focal inflammation to suggest diverticulitis. Vascular/Lymphatic: Atherosclerotic calcifications within the abdominal aorta and branch vessels. Multilobular fusiform ectasia of the infrarenal abdominal aorta with maximal diameter measuring up to 3.2 cm in size. No other aneurysm or ectasia. No enlarged abdominopelvic lymph nodes. Reproductive: Slightly retroverted uterus. No concerning adnexal lesions. Other: No abdominopelvic free fluid or free gas. No bowel containing hernias. Small fat containing umbilical hernia. Musculoskeletal: No acute osseous abnormality or suspicious osseous lesion. Multilevel degenerative changes are present in the imaged portions of the spine. Mild degenerative changes in the hips and pelvis. Review of the MIP images confirms the above findings. IMPRESSION: 1. No evidence of pulmonary embolism. 2. Mosaic areas of attenuation in the lungs may reflect areas of air trapping or small airways disease. 3. No other acute findings in the chest, abdomen or pelvis to explain the patient's symptoms. 4. Multilobular fusiform ectasia of the infrarenal abdominal aorta with maximal diameter measuring up to 3.2 cm in size. Recommend followup by ultrasound in 3 years. This recommendation  follows ACR consensus guidelines: White Paper of the ACR Incidental Findings Committee II on Vascular Findings. J Am Coll Radiol 2013; 10:789-794. Aortic aneurysm NOS (ICD10-I71.9) 5. Three-vessel coronary artery calcifications are present. Please note that the presence of coronary artery calcium documents the presence of coronary artery disease, the severity of this disease and any potential stenosis cannot be assessed on this non-gated CT examination. Assessment for potential risk factor modification, dietary therapy or pharmacologic therapy may be warranted. 6. Aortic Atherosclerosis (ICD10-I70.0). Electronically Signed   By: Lovena Le M.D.   On: 11/03/2019 19:11   CT Abdomen Pelvis W Contrast  Result Date: 11/03/2019 CLINICAL DATA:  Epigastric and chest pain with 2 days of emesis, cough a ground emesis, wheezing EXAM: CT ANGIOGRAPHY CHEST CT ABDOMEN AND PELVIS WITH CONTRAST TECHNIQUE: Multidetector CT imaging of the chest was performed using the standard protocol during bolus administration of intravenous contrast. Multiplanar CT image reconstructions and MIPs were obtained to evaluate the vascular anatomy. Multidetector CT imaging of the abdomen and pelvis was performed using the standard protocol during bolus administration of intravenous contrast. CONTRAST:  48m OMNIPAQUE IOHEXOL 350 MG/ML SOLN COMPARISON:  Chest CT 01/28/2019, chest radiograph 11/03/2019, CT chest, abdomen and pelvis 05/28/2018 FINDINGS: CTA CHEST FINDINGS Cardiovascular: Satisfactory opacification the pulmonary arteries to the segmental level. No pulmonary artery filling defects are identified. Central pulmonary arteries are normal caliber. Normal heart size. No pericardial effusion. Three-vessel coronary artery calcifications are present. Atherosclerotic plaque within the normal caliber aorta. No acute luminal abnormality. No periaortic stranding or hemorrhage. Normal 3 vessel branching of the aortic arch with calcified plaque  within the proximal great vessels. No major venous abnormalities. Mediastinum/Nodes: Few mildly prominent mediastinal and hilar nodes including a larger 10 mm precarinal node (2/29), and 10 mm right hilar node (2/36) are not significantly changed from prior. No new enlarged or enlarging worrisome mediastinal, hilar or axillary lymph nodes. No mediastinal fluid or gas. Normal thyroid gland and thoracic inlet. Lungs/Pleura: Respiratory motion degraded images. No consolidation, features of edema, pneumothorax, or effusion. Dependent atelectatic changes posteriorly. Some mosaic areas of attenuation in the lungs may reflect areas of air trapping or small airways  disease. Musculoskeletal: No acute or worrisome osseous abnormalities in the spine. Stable mild anterior wedging at T12 unchanged from prior with superimposed Schmorl's node formations. Mild multilevel degenerative changes with slightly exaggerated thoracic kyphosis. Additional degenerative changes in the shoulders including a mineralized joint body in the medial recess of the left shoulder. No worrisome chest wall lesions. Review of the MIP images confirms the above findings. CT ABDOMEN and PELVIS FINDINGS Hepatobiliary: No worrisome focal liver lesions. Smooth liver surface contour. Normal hepatic attenuation. Gallbladder largely decompressed at the time of exam imaging with wall thickening appropriate for this degree of distention. No visible calcified gallstones or biliary ductal dilatation. No pericholecystic fluid or inflammation. Pancreas: Unremarkable. No pancreatic ductal dilatation or surrounding inflammatory changes. Spleen: Normal in size. No concerning splenic lesions. Adrenals/Urinary Tract: Normal adrenal glands. Kidneys enhance and excrete symmetrically. No concerning renal lesions. No urolithiasis or hydronephrosis. Urinary bladder is unremarkable. Stomach/Bowel: Distal esophagus, stomach and duodenal sweep are unremarkable. No small bowel wall  thickening or dilatation. No evidence of obstruction. Small appendiceal stump/remnant in the right lower quadrant (7/33-36) with chart indicating prior appendectomy. Slight mobility of the cecum without evidence of volvulus. No colonic dilatation or wall thickening. Scattered colonic diverticula without focal inflammation to suggest diverticulitis. Vascular/Lymphatic: Atherosclerotic calcifications within the abdominal aorta and branch vessels. Multilobular fusiform ectasia of the infrarenal abdominal aorta with maximal diameter measuring up to 3.2 cm in size. No other aneurysm or ectasia. No enlarged abdominopelvic lymph nodes. Reproductive: Slightly retroverted uterus. No concerning adnexal lesions. Other: No abdominopelvic free fluid or free gas. No bowel containing hernias. Small fat containing umbilical hernia. Musculoskeletal: No acute osseous abnormality or suspicious osseous lesion. Multilevel degenerative changes are present in the imaged portions of the spine. Mild degenerative changes in the hips and pelvis. Review of the MIP images confirms the above findings. IMPRESSION: 1. No evidence of pulmonary embolism. 2. Mosaic areas of attenuation in the lungs may reflect areas of air trapping or small airways disease. 3. No other acute findings in the chest, abdomen or pelvis to explain the patient's symptoms. 4. Multilobular fusiform ectasia of the infrarenal abdominal aorta with maximal diameter measuring up to 3.2 cm in size. Recommend followup by ultrasound in 3 years. This recommendation follows ACR consensus guidelines: White Paper of the ACR Incidental Findings Committee II on Vascular Findings. J Am Coll Radiol 2013; 10:789-794. Aortic aneurysm NOS (ICD10-I71.9) 5. Three-vessel coronary artery calcifications are present. Please note that the presence of coronary artery calcium documents the presence of coronary artery disease, the severity of this disease and any potential stenosis cannot be assessed on  this non-gated CT examination. Assessment for potential risk factor modification, dietary therapy or pharmacologic therapy may be warranted. 6. Aortic Atherosclerosis (ICD10-I70.0). Electronically Signed   By: Lovena Le M.D.   On: 11/03/2019 19:11   DG Abd Portable 1V  Result Date: 11/04/2019 CLINICAL DATA:  Abdominal distension and vomiting. EXAM: PORTABLE ABDOMEN - 1 VIEW COMPARISON:  07/20/2014 and abdomen and pelvis CT obtained yesterday. FINDINGS: Normal bowel gas pattern. Atheromatous arterial calcifications. Lower thoracic spine degenerative changes. IMPRESSION: No acute abnormality. Electronically Signed   By: Claudie Revering M.D.   On: 11/04/2019 12:20      Impression / Plan:   Linda Mejia is a 64 y.o. female with metabolic syndrome, known history of peptic ulcer disease, H. pylori infection treated with triple therapy in the past, history of gastric intestinal metaplasia admitted with nausea, coffee-ground emesis as well as abdominal pain  Normal lipase and LFTs, CT abdomen pelvis with contrast with no evidence of acute intra-abdominal pathology CT PE protocol is negative We will proceed with upper endoscopy with gastric mapping today Trial of sucralfate suspension 3-4 times daily as needed Protonix 40 mg twice daily Maintain n.p.o. Consider gastric emptying study as outpt if EGD is unremarkable  Thank you for involving me in the care of this patient.  Dr Vicente Males will cover for the weekend    LOS: 0 days   Sherri Sear, MD  11/04/2019, 2:37 PM   Note: This dictation was prepared with Dragon dictation along with smaller phrase technology. Any transcriptional errors that result from this process are unintentional.

## 2019-11-04 NOTE — Progress Notes (Signed)
PROGRESS NOTE    Linda Mejia  ZOX:[id number] DOB: 1956/03/16 DOA: 11/03/2019 PCP: Towanda Malkin, MD   Brief Narrative: Linda Mejia is a 64 y.o. female with medical history significant for CAD, anxiety, HTN, PUD. She presented secondary to nausea, vomiting and malaise with associated atypical chest pain. GI consulted for EGD. Antiemetic for nausea/vomiting. Initial chest pain workup is negative.   Assessment & Plan:   Principal Problem:   Nausea and vomiting Active Problems:   Coronary artery disease involving native coronary artery of native heart with angina pectoris (HCC)   Hypertension goal BP (blood pressure) < 140/90   History of gastric ulcer   Subclinical hypothyroidism   Chest pain   Acute respiratory failure with hypoxia (HCC)   Lactic acidosis   AKI (acute kidney injury) ([place])   Malaise   Nausea/vomiting Unknown etiology but possible secondary to known history of gastritis/gastric ulcer disease. Patient does state she previously had diarrhea but that has now resolved. She has associated malaise. Nausea improved. No vomiting currently but she has not taken anything by mouth. -Continue Zofran prn -GI recommendations: EGD today -Continue IV fluids until diet ordered -Continue Protonix  Atypical chest pain Appears related to GI system. ACS workup negative. Per patient, similar to gastritis pain.  Acute respiratory failure with hypoxia Unknown etiology. Placed on oxygen secondary to an SpO2 of 86%. Oxygen saturation looks adequate today. CTA chest negative for acute process, including no PE. -Wean to room air  CAD -Continue aspirin  AKI Mild with a peak creatinine of 1.08. Baseline creatinine of 0.62. Resolved with IV fluids.  Lactic acidosis Mild with a peak of 2 and resolved with IV fluids.  Essential hypertension Patient is on metoprolol as an outpatient. -Continue Metoprolol once diet ordered   DVT prophylaxis: SCDs Code Status:   Code Status:  Full Code Family Communication: None at bedside Disposition Plan: Discharge possible in 24-48 hours if tolerating diet   Consultants:   Gastroenterology  Procedures:   EGD  Antimicrobials:  None    Subjective: Nausea and vomiting improved. Still with some intermittent pain  Objective: Vitals:   11/04/19 0300 11/04/19 0400 11/04/19 0500 11/04/19 1411  BP: 116/73 126/75 126/72 (!) 140/79  Pulse: 74 79 72 67  Resp: (!) 9 (!) 11 (!) 10 18  Temp:  98.4 F (36.9 C)  (!) 96.5 F (35.8 C)  TempSrc:  Oral  Temporal  SpO2: 93% 94% 92% 100%  Weight:      Height:        Intake/Output Summary (Last 24 hours) at 11/04/2019 1514 Last data filed at 11/04/2019 0814 Gross per 24 hour  Intake 2000 ml  Output --  Net 2000 ml   Filed Weights   11/03/19 1538  Weight: (!) 99.3 kg    Examination:  General exam: Appears calm and comfortable Respiratory system: Clear to auscultation. Respiratory effort normal. Cardiovascular system: S1 & S2 heard, RRR. No murmurs, rubs, gallops or clicks. Gastrointestinal system: Abdomen is distended, soft. No organomegaly or masses felt. Normal bowel sounds heard. Central nervous system: Alert and oriented. No focal neurological deficits. Musculoskeletal: No calf tenderness Skin: No cyanosis. No rashes Psychiatry: Judgement and insight appear normal. Mood & affect appropriate.     Data Reviewed: I have personally reviewed following labs and imaging studies  CBC Lab Results  Component Value Date   WBC 9.1 11/04/2019   RBC 3.85 (L) 11/04/2019   HGB 12.3 11/04/2019   HCT 39.7 11/04/2019  MCV 103.1 (H) 11/04/2019   MCH 31.9 11/04/2019   PLT 301 11/04/2019   MCHC 31.0 11/04/2019   RDW 13.2 11/04/2019   LYMPHSABS 2.1 11/03/2019   MONOABS 0.5 11/03/2019   EOSABS 0.0 11/03/2019   BASOSABS 0.0 38/01/1750     Last metabolic panel Lab Results  Component Value Date   NA 142 11/04/2019   K 4.2 11/04/2019   CL 104 11/04/2019   CO2 27  11/04/2019   BUN 17 11/04/2019   CREATININE 0.76 11/04/2019   GLUCOSE 141 (H) 11/04/2019   GFRNONAA >60 11/04/2019   GFRAA >60 11/04/2019   CALCIUM 8.6 (L) 11/04/2019   PHOS 2.8 05/17/2018   PROT 7.4 11/03/2019   ALBUMIN 4.0 11/03/2019   BILITOT 0.5 11/03/2019   ALKPHOS 63 11/03/2019   AST 28 11/03/2019   ALT 18 11/03/2019   ANIONGAP 11 11/04/2019    CBG (last 3)  Recent Labs    11/04/19 1416  GLUCAP 106*     GFR: Estimated Creatinine Clearance: 84 mL/min (by C-G formula based on SCr of 0.76 mg/dL).  Coagulation Profile: No results for input(s): INR, PROTIME in the last 168 hours.  Recent Results (from the past 240 hour(s))  SARS Coronavirus 2 by RT PCR (hospital order, performed in [facility] hospital lab) Nasopharyngeal Nasopharyngeal Swab     Status: None   Collection Time: 11/03/19  6:47 PM   Specimen: Nasopharyngeal Swab  Result Value Ref Range Status   SARS Coronavirus 2 NEGATIVE NEGATIVE Final    Comment: (NOTE) SARS-CoV-2 target nucleic acids are NOT DETECTED.  The SARS-CoV-2 RNA is generally detectable in upper and lower respiratory specimens during the acute phase of infection. The lowest concentration of SARS-CoV-2 viral copies this assay can detect is 250 copies / mL. A negative result does not preclude SARS-CoV-2 infection and should not be used as the sole basis for treatment or other patient management decisions.  A negative result may occur with improper specimen collection / handling, submission of specimen other than nasopharyngeal swab, presence of viral mutation(s) within the areas targeted by this assay, and inadequate number of viral copies (<250 copies / mL). A negative result must be combined with clinical observations, patient history, and epidemiological information.  Fact Sheet for Patients:   StrictlyIdeas.no  Fact Sheet for Healthcare Providers: BankingDealers.co.za  This test is not  yet approved or  cleared by the [place] FDA and has been authorized for detection and/or diagnosis of SARS-CoV-2 by FDA under an Emergency Use Authorization (EUA).  This EUA will remain in effect (meaning this test can be used) for the duration of the COVID-19 declaration under Section 564(b)(1) of the Act, 21 U.S.C. section 360bbb-3(b)(1), unless the authorization is terminated or revoked sooner.  Performed at [facility], [address] [place], [place] [address]         Radiology Studies: DG Chest 2 View  Result Date: 11/03/2019 CLINICAL DATA:  Chest pain, vomiting, coffee-ground emesis EXAM: CHEST - 2 VIEW COMPARISON:  10/24/2019 FINDINGS: Minimal lingular atelectasis or scarring. The lungs are otherwise clear. No pneumothorax or pleural effusion. Cardiac size within normal limits. Pulmonary vascularity normal. No acute bone abnormality. IMPRESSION: No active cardiopulmonary disease. Electronically Signed   By: Fidela Salisbury MD   On: 11/03/2019 18:05   CT Angio Chest PE W/Cm &/Or Wo Cm  Result Date: 11/03/2019 CLINICAL DATA:  Epigastric and chest pain with 2 days of emesis, cough a ground emesis, wheezing EXAM: CT ANGIOGRAPHY CHEST  CT ABDOMEN AND PELVIS WITH CONTRAST TECHNIQUE: Multidetector CT imaging of the chest was performed using the standard protocol during bolus administration of intravenous contrast. Multiplanar CT image reconstructions and MIPs were obtained to evaluate the vascular anatomy. Multidetector CT imaging of the abdomen and pelvis was performed using the standard protocol during bolus administration of intravenous contrast. CONTRAST:  29mL OMNIPAQUE IOHEXOL 350 MG/ML SOLN COMPARISON:  Chest CT 01/28/2019, chest radiograph 11/03/2019, CT chest, abdomen and pelvis 05/28/2018 FINDINGS: CTA CHEST FINDINGS Cardiovascular: Satisfactory opacification the pulmonary arteries to the segmental level. No pulmonary artery filling defects are identified. Central  pulmonary arteries are normal caliber. Normal heart size. No pericardial effusion. Three-vessel coronary artery calcifications are present. Atherosclerotic plaque within the normal caliber aorta. No acute luminal abnormality. No periaortic stranding or hemorrhage. Normal 3 vessel branching of the aortic arch with calcified plaque within the proximal great vessels. No major venous abnormalities. Mediastinum/Nodes: Few mildly prominent mediastinal and hilar nodes including a larger 10 mm precarinal node (2/29), and 10 mm right hilar node (2/36) are not significantly changed from prior. No new enlarged or enlarging worrisome mediastinal, hilar or axillary lymph nodes. No mediastinal fluid or gas. Normal thyroid gland and thoracic inlet. Lungs/Pleura: Respiratory motion degraded images. No consolidation, features of edema, pneumothorax, or effusion. Dependent atelectatic changes posteriorly. Some mosaic areas of attenuation in the lungs may reflect areas of air trapping or small airways disease. Musculoskeletal: No acute or worrisome osseous abnormalities in the spine. Stable mild anterior wedging at T12 unchanged from prior with superimposed Schmorl's node formations. Mild multilevel degenerative changes with slightly exaggerated thoracic kyphosis. Additional degenerative changes in the shoulders including a mineralized joint body in the medial recess of the left shoulder. No worrisome chest wall lesions. Review of the MIP images confirms the above findings. CT ABDOMEN and PELVIS FINDINGS Hepatobiliary: No worrisome focal liver lesions. Smooth liver surface contour. Normal hepatic attenuation. Gallbladder largely decompressed at the time of exam imaging with wall thickening appropriate for this degree of distention. No visible calcified gallstones or biliary ductal dilatation. No pericholecystic fluid or inflammation. Pancreas: Unremarkable. No pancreatic ductal dilatation or surrounding inflammatory changes. Spleen:  Normal in size. No concerning splenic lesions. Adrenals/Urinary Tract: Normal adrenal glands. Kidneys enhance and excrete symmetrically. No concerning renal lesions. No urolithiasis or hydronephrosis. Urinary bladder is unremarkable. Stomach/Bowel: Distal esophagus, stomach and duodenal sweep are unremarkable. No small bowel wall thickening or dilatation. No evidence of obstruction. Small appendiceal stump/remnant in the right lower quadrant (7/33-36) with chart indicating prior appendectomy. Slight mobility of the cecum without evidence of volvulus. No colonic dilatation or wall thickening. Scattered colonic diverticula without focal inflammation to suggest diverticulitis. Vascular/Lymphatic: Atherosclerotic calcifications within the abdominal aorta and branch vessels. Multilobular fusiform ectasia of the infrarenal abdominal aorta with maximal diameter measuring up to 3.2 cm in size. No other aneurysm or ectasia. No enlarged abdominopelvic lymph nodes. Reproductive: Slightly retroverted uterus. No concerning adnexal lesions. Other: No abdominopelvic free fluid or free gas. No bowel containing hernias. Small fat containing umbilical hernia. Musculoskeletal: No acute osseous abnormality or suspicious osseous lesion. Multilevel degenerative changes are present in the imaged portions of the spine. Mild degenerative changes in the hips and pelvis. Review of the MIP images confirms the above findings. IMPRESSION: 1. No evidence of pulmonary embolism. 2. Mosaic areas of attenuation in the lungs may reflect areas of air trapping or small airways disease. 3. No other acute findings in the chest, abdomen or pelvis to explain the patient's symptoms. 4.  Multilobular fusiform ectasia of the infrarenal abdominal aorta with maximal diameter measuring up to 3.2 cm in size. Recommend followup by ultrasound in 3 years. This recommendation follows ACR consensus guidelines: White Paper of the ACR Incidental Findings Committee II on  Vascular Findings. J Am Coll Radiol 2013; 10:789-794. Aortic aneurysm NOS (ICD10-I71.9) 5. Three-vessel coronary artery calcifications are present. Please note that the presence of coronary artery calcium documents the presence of coronary artery disease, the severity of this disease and any potential stenosis cannot be assessed on this non-gated CT examination. Assessment for potential risk factor modification, dietary therapy or pharmacologic therapy may be warranted. 6. Aortic Atherosclerosis (ICD10-I70.0). Electronically Signed   By: Lovena Le M.D.   On: 11/03/2019 19:11   CT Abdomen Pelvis W Contrast  Result Date: 11/03/2019 CLINICAL DATA:  Epigastric and chest pain with 2 days of emesis, cough a ground emesis, wheezing EXAM: CT ANGIOGRAPHY CHEST CT ABDOMEN AND PELVIS WITH CONTRAST TECHNIQUE: Multidetector CT imaging of the chest was performed using the standard protocol during bolus administration of intravenous contrast. Multiplanar CT image reconstructions and MIPs were obtained to evaluate the vascular anatomy. Multidetector CT imaging of the abdomen and pelvis was performed using the standard protocol during bolus administration of intravenous contrast. CONTRAST:  30mL OMNIPAQUE IOHEXOL 350 MG/ML SOLN COMPARISON:  Chest CT 01/28/2019, chest radiograph 11/03/2019, CT chest, abdomen and pelvis 05/28/2018 FINDINGS: CTA CHEST FINDINGS Cardiovascular: Satisfactory opacification the pulmonary arteries to the segmental level. No pulmonary artery filling defects are identified. Central pulmonary arteries are normal caliber. Normal heart size. No pericardial effusion. Three-vessel coronary artery calcifications are present. Atherosclerotic plaque within the normal caliber aorta. No acute luminal abnormality. No periaortic stranding or hemorrhage. Normal 3 vessel branching of the aortic arch with calcified plaque within the proximal great vessels. No major venous abnormalities. Mediastinum/Nodes: Few mildly  prominent mediastinal and hilar nodes including a larger 10 mm precarinal node (2/29), and 10 mm right hilar node (2/36) are not significantly changed from prior. No new enlarged or enlarging worrisome mediastinal, hilar or axillary lymph nodes. No mediastinal fluid or gas. Normal thyroid gland and thoracic inlet. Lungs/Pleura: Respiratory motion degraded images. No consolidation, features of edema, pneumothorax, or effusion. Dependent atelectatic changes posteriorly. Some mosaic areas of attenuation in the lungs may reflect areas of air trapping or small airways disease. Musculoskeletal: No acute or worrisome osseous abnormalities in the spine. Stable mild anterior wedging at T12 unchanged from prior with superimposed Schmorl's node formations. Mild multilevel degenerative changes with slightly exaggerated thoracic kyphosis. Additional degenerative changes in the shoulders including a mineralized joint body in the medial recess of the left shoulder. No worrisome chest wall lesions. Review of the MIP images confirms the above findings. CT ABDOMEN and PELVIS FINDINGS Hepatobiliary: No worrisome focal liver lesions. Smooth liver surface contour. Normal hepatic attenuation. Gallbladder largely decompressed at the time of exam imaging with wall thickening appropriate for this degree of distention. No visible calcified gallstones or biliary ductal dilatation. No pericholecystic fluid or inflammation. Pancreas: Unremarkable. No pancreatic ductal dilatation or surrounding inflammatory changes. Spleen: Normal in size. No concerning splenic lesions. Adrenals/Urinary Tract: Normal adrenal glands. Kidneys enhance and excrete symmetrically. No concerning renal lesions. No urolithiasis or hydronephrosis. Urinary bladder is unremarkable. Stomach/Bowel: Distal esophagus, stomach and duodenal sweep are unremarkable. No small bowel wall thickening or dilatation. No evidence of obstruction. Small appendiceal stump/remnant in the  right lower quadrant (7/33-36) with chart indicating prior appendectomy. Slight mobility of the cecum without evidence of volvulus.  No colonic dilatation or wall thickening. Scattered colonic diverticula without focal inflammation to suggest diverticulitis. Vascular/Lymphatic: Atherosclerotic calcifications within the abdominal aorta and branch vessels. Multilobular fusiform ectasia of the infrarenal abdominal aorta with maximal diameter measuring up to 3.2 cm in size. No other aneurysm or ectasia. No enlarged abdominopelvic lymph nodes. Reproductive: Slightly retroverted uterus. No concerning adnexal lesions. Other: No abdominopelvic free fluid or free gas. No bowel containing hernias. Small fat containing umbilical hernia. Musculoskeletal: No acute osseous abnormality or suspicious osseous lesion. Multilevel degenerative changes are present in the imaged portions of the spine. Mild degenerative changes in the hips and pelvis. Review of the MIP images confirms the above findings. IMPRESSION: 1. No evidence of pulmonary embolism. 2. Mosaic areas of attenuation in the lungs may reflect areas of air trapping or small airways disease. 3. No other acute findings in the chest, abdomen or pelvis to explain the patient's symptoms. 4. Multilobular fusiform ectasia of the infrarenal abdominal aorta with maximal diameter measuring up to 3.2 cm in size. Recommend followup by ultrasound in 3 years. This recommendation follows ACR consensus guidelines: White Paper of the ACR Incidental Findings Committee II on Vascular Findings. J Am Coll Radiol 2013; 10:789-794. Aortic aneurysm NOS (ICD10-I71.9) 5. Three-vessel coronary artery calcifications are present. Please note that the presence of coronary artery calcium documents the presence of coronary artery disease, the severity of this disease and any potential stenosis cannot be assessed on this non-gated CT examination. Assessment for potential risk factor modification, dietary  therapy or pharmacologic therapy may be warranted. 6. Aortic Atherosclerosis (ICD10-I70.0). Electronically Signed   By: Lovena Le M.D.   On: 11/03/2019 19:11   DG Abd Portable 1V  Result Date: 11/04/2019 CLINICAL DATA:  Abdominal distension and vomiting. EXAM: PORTABLE ABDOMEN - 1 VIEW COMPARISON:  07/20/2014 and abdomen and pelvis CT obtained yesterday. FINDINGS: Normal bowel gas pattern. Atheromatous arterial calcifications. Lower thoracic spine degenerative changes. IMPRESSION: No acute abnormality. Electronically Signed   By: Claudie Revering M.D.   On: 11/04/2019 12:20        Scheduled Meds: . [MAR Hold] amphetamine-dextroamphetamine  30 mg Oral BID  . [MAR Hold] aspirin EC  81 mg Oral Daily  . [MAR Hold] metoprolol tartrate  50 mg Oral BID  . [MAR Hold] pantoprazole  40 mg Oral Daily   Continuous Infusions: . sodium chloride Stopped (11/04/19 0814)  . sodium chloride       LOS: 0 days     Cordelia Poche, MD [facility] 11/04/2019, 3:14 PM  If 7PM-7AM, please contact night-coverage [email or web address]

## 2019-11-04 NOTE — Op Note (Signed)
[facility] Gastroenterology Patient Name: Linda Mejia Procedure Date: 11/04/2019 2:50 PM MRN: [id number] Account #: [id number] Date of Birth: 08/18/1955 Admit Type: Inpatient Age: 64 Room: [facility] ENDO ROOM 2 Gender: Female Note Status: Finalized Procedure:             Upper GI endoscopy Indications:           Abdominal pain in the left upper quadrant, Follow-up                         of intestinal metaplasia, Nausea with vomiting Providers:             Lin Landsman MD, MD Medicines:             Monitored Anesthesia Care Complications:         No immediate complications. Estimated blood loss: None. Procedure:             Pre-Anesthesia Assessment:                        - Prior to the procedure, a History and Physical was                         performed, and patient medications and allergies were                         reviewed. The patient is competent. The risks and                         benefits of the procedure and the sedation options and                         risks were discussed with the patient. All questions                         were answered and informed consent was obtained.                         Patient identification and proposed procedure were                         verified by the physician, the nurse, the                         anesthesiologist, the anesthetist and the technician                         in the pre-procedure area in the procedure room in the                         endoscopy suite. Mental Status Examination: alert and                         oriented. Airway Examination: normal oropharyngeal                         airway and neck mobility. Respiratory Examination:                         clear  to auscultation. CV Examination: normal.                         Prophylactic Antibiotics: The patient does not require                         prophylactic antibiotics. Prior Anticoagulants: The                         patient  has taken no previous anticoagulant or                         antiplatelet agents. ASA Grade Assessment: III - A                         patient with severe systemic disease. After reviewing                         the risks and benefits, the patient was deemed in                         satisfactory condition to undergo the procedure. The                         anesthesia plan was to use monitored anesthesia care                         (MAC). Immediately prior to administration of                         medications, the patient was re-assessed for adequacy                         to receive sedatives. The heart rate, respiratory                         rate, oxygen saturations, blood pressure, adequacy of                         pulmonary ventilation, and response to care were                         monitored throughout the procedure. The physical                         status of the patient was re-assessed after the                         procedure.                        After obtaining informed consent, the endoscope was                         passed under direct vision. Throughout the procedure,                         the patient's blood pressure, pulse, and oxygen  saturations were monitored continuously. The Endoscope                         was introduced through the mouth, and advanced to the                         second part of duodenum. The upper GI endoscopy was                         accomplished without difficulty. The patient tolerated                         the procedure well. Findings:      The duodenal bulb and second portion of the duodenum were normal.       Biopsies were taken with a cold forceps for histology.      Diffuse mildly erythematous mucosa without bleeding was found in the       prepyloric region of the stomach.      The gastric body, incisura and gastric antrum were normal. Biopsies were       taken with a cold forceps  for histology.      The cardia and gastric fundus were normal on retroflexion.      Esophagogastric landmarks were identified: the gastroesophageal junction       was found at 40 cm from the incisors.      The gastroesophageal junction and examined esophagus were normal. Impression:            - Normal duodenal bulb and second portion of the                         duodenum. Biopsied.                        - Erythematous mucosa in the prepyloric region of the                         stomach.                        - Normal gastric body, incisura and antrum. Biopsied.                        - Esophagogastric landmarks identified.                        - Normal gastroesophageal junction and esophagus. Recommendation:        - Await pathology results.                        - Return patient to hospital ward for ongoing care.                        - Use a proton pump inhibitor PO BID.                        - Return to GI clinic as previously scheduled. Procedure Code(s):     --- Professional ---                        [id number],  Esophagogastroduodenoscopy, flexible,                         transoral; with biopsy, single or multiple Diagnosis Code(s):     --- Professional ---                        K31.89, Other diseases of stomach and duodenum                        R11.2, Nausea with vomiting, unspecified                        R10.12, Left upper quadrant pain CPT copyright 2019 [employer]. All rights reserved. The codes documented in this report are preliminary and upon coder review may  be revised to meet current compliance requirements. Dr. Ulyess Mort Lin Landsman MD, MD 11/04/2019 3:29:52 PM This report has been signed electronically. Number of Addenda: 0 Note Initiated On: 11/04/2019 2:50 PM Estimated Blood Loss:  Estimated blood loss: none.      [facility]

## 2019-11-04 NOTE — Transfer of Care (Signed)
Immediate Anesthesia Transfer of Care Note  Patient: Linda Mejia  Procedure(s) Performed: ESOPHAGOGASTRODUODENOSCOPY (EGD) WITH PROPOFOL (N/A )  Patient Location: Endoscopy Unit  Anesthesia Type:General  Level of Consciousness: drowsy  Airway & Oxygen Therapy: Patient Spontanous Breathing  Post-op Assessment: Report given to RN and Post -op Vital signs reviewed and stable  Post vital signs: Reviewed and stable  Last Vitals:  Vitals Value Taken Time  BP 114/65 11/04/19 1534  Temp 36.1 C 11/04/19 1534  Pulse 71 11/04/19 1534  Resp 13 11/04/19 1534  SpO2 97 % 11/04/19 1534    Last Pain:  Vitals:   11/04/19 1534  TempSrc: Temporal  PainSc: 5          Complications: No complications documented.

## 2019-11-05 LAB — SEDIMENTATION RATE: Sed Rate: 14 mm/hr (ref 0–30)

## 2019-11-05 NOTE — Evaluation (Signed)
Physical Therapy Evaluation Patient Details Name: Linda Mejia MRN: [id number] DOB: 1955-06-13 Today's Date: 11/05/2019   History of Present Illness  Patient is a 64 year old female who arrived to the emergency room tachycardic with low grade fever; one day history of left sided chest pain and one week history of malaise, body aches, fever, nausea with emesis. PMH includes CAD, anxiety, HTN, PUD, ADHD, stomach ulcers, left heart cath, substance abuse, abnormal BI.    Clinical Impression  Patient is a pleasant 64 year old female who presents with limited capacity for mobility and instability. Patient is independent at baseline and refuses use of any AD. She reports she wants to get better due to upcoming granddaughter being expected. Patient is in bed upon PT arrival and agreeable to evaluation. She is A and Ox4 with good safety awareness .She initially transitions with SUE support with hand on PT hand but demonstrates ability to perform without assistance. Ambulation performed with occasional touches to surfaces in room with CGA. Ambulation is limited by pain in bilateral knees and fatigue. She is challenged with head turns, dual task, and pivots however is functional and safe for basic negotiation of environment. Patient educated on what outpatient PT is and how it will benefit her balance and capacity for mobility. Patient will remain on PT caseload while hospitalized to promote activity as able. Upon discharge patient would benefit from outpatient PT to improve strength, stability, and mobility.      Follow Up Recommendations Outpatient PT    Equipment Recommendations  None recommended by PT;Other (comment) (Patient declines cane or walker)    Recommendations for Other Services       Precautions / Restrictions Precautions Precautions: None Restrictions Weight Bearing Restrictions: No      Mobility  Bed Mobility Overal bed mobility: Modified Independent             General bed  mobility comments: use of UE's with HOB elevated  Transfers Overall transfer level: Modified independent Equipment used: None;1 person hand held assist             General transfer comment: STS with hand on PT hand, other hand on bed, able to perform with both hands on bed as well.  Ambulation/Gait Ambulation/Gait assistance: Min guard Gait Distance (Feet): 60 Feet Assistive device: None Gait Pattern/deviations: Step-through pattern;Antalgic Gait velocity: decreased   General Gait Details: Patient "furniture surfs" occasionally in room touching objects to steady herself, declines walker or cane for stability. Has pain in LE's but no LOB.  Stairs            Wheelchair Mobility    Modified Rankin (Stroke Patients Only)       Balance Overall balance assessment: Needs assistance Sitting-balance support: No upper extremity supported;Feet supported Sitting balance-Leahy Scale: Good Sitting balance - Comments: able to reach inside/outside BOS   Standing balance support: No upper extremity supported Standing balance-Leahy Scale: Fair Standing balance comment: occasional need for SUE support with ambulation to steady herself               High Level Balance Comments: unstable with higher level of balance such as head turns and pivot             Pertinent Vitals/Pain Pain Assessment: Faces Faces Pain Scale: Hurts little more Pain Location: bilateral knees Pain Descriptors / Indicators: Aching Pain Intervention(s): Monitored during session;Repositioned    Home Living Family/patient expects to be discharged to:: Private residence Living Arrangements: Alone Available Help  at Discharge: Family Type of Home: House Home Access: Stairs to enter Entrance Stairs-Rails: Can reach both Entrance Stairs-Number of Steps: 4 Home Layout: One level Home Equipment: Grab bars - tub/shower Additional Comments: Patient ind at home    Prior Function Level of  Independence: Independent         Comments: Patient ind with ambulation, ADL, and iADL     Hand Dominance        Extremity/Trunk Assessment   Upper Extremity Assessment Upper Extremity Assessment: Defer to OT evaluation    Lower Extremity Assessment Lower Extremity Assessment: Generalized weakness (Grossly 4-/5, unable to provide overpressure due to knee pain)       Communication   Communication: No difficulties  Cognition Arousal/Alertness: Awake/alert Behavior During Therapy: WFL for tasks assessed/performed Overall Cognitive Status: Within Functional Limits for tasks assessed                                 General Comments: Patient alert and oriented x4      General Comments General comments (skin integrity, edema, etc.): patient appears well groomed    Exercises Other Exercises Other Exercises: Patient educated on role of PT in acute care setting, safe mobility and transfers. Ambulation within room with stability education. Education on outpatient physical therapy and why it will benefit her balance.   Assessment/Plan    PT Assessment Patient needs continued PT services  PT Problem List Decreased strength;Decreased activity tolerance;Decreased balance;Decreased knowledge of use of DME;Decreased mobility;Obesity;Pain       PT Treatment Interventions DME instruction;Gait training;Stair training;Therapeutic activities;Functional mobility training;Patient/family education;Neuromuscular re-education;Balance training;Therapeutic exercise;Manual techniques    PT Goals (Current goals can be found in the Care Plan section)  Acute Rehab PT Goals Patient Stated Goal: to return home PT Goal Formulation: With patient Time For Goal Achievement: 11/19/19 Potential to Achieve Goals: Fair    Frequency Min 2X/week   Barriers to discharge   would benefit from outpatient PT for balance/strength.    Co-evaluation               AM-PAC PT "6 Clicks"  Mobility  Outcome Measure Help needed turning from your back to your side while in a flat bed without using bedrails?: None Help needed moving from lying on your back to sitting on the side of a flat bed without using bedrails?: A Little Help needed moving to and from a bed to a chair (including a wheelchair)?: None Help needed standing up from a chair using your arms (e.g., wheelchair or bedside chair)?: None Help needed to walk in hospital room?: A Little Help needed climbing 3-5 steps with a railing? : A Little 6 Click Score: 21    End of Session Equipment Utilized During Treatment: Gait belt Activity Tolerance: Patient tolerated treatment well Patient left: in bed;with nursing/sitter in room;with call bell/phone within reach   PT Visit Diagnosis: Unsteadiness on feet (R26.81);Other abnormalities of gait and mobility (R26.89);Muscle weakness (generalized) (M62.81);Pain Pain - Right/Left: Left (bilateral knees) Pain - part of body: Knee    Time: 1308-6578 PT Time Calculation (min) (ACUTE ONLY): 33 min   Charges:   PT Evaluation $PT Eval Moderate Complexity: 1 Mod PT Treatments $Gait Training: 8-22 mins      Janna Arch, PT, DPT   11/05/2019, 4:16 PM

## 2019-11-05 NOTE — Progress Notes (Signed)
PROGRESS NOTE    Linda Mejia  IZT:[id number] DOB: [birth year] DOA: 11/03/2019 PCP: Towanda Malkin, MD   Brief Narrative: Linda Mejia is a 64 y.o. female with medical history significant for CAD, anxiety, HTN, PUD. She presented secondary to nausea, vomiting and malaise with associated atypical chest pain. GI consulted for EGD. Antiemetic for nausea/vomiting. Initial chest pain workup is negative.   Assessment & Plan:   Principal Problem:   Nausea and vomiting Active Problems:   Coronary artery disease involving native coronary artery of native heart with angina pectoris (HCC)   Hypertension goal BP (blood pressure) < 140/90   History of gastric ulcer   Subclinical hypothyroidism   Chest pain   Acute respiratory failure with hypoxia (HCC)   Lactic acidosis   AKI (acute kidney injury) ([place])   Malaise   Hypoxia   Abdominal distension   Nausea/vomiting Unknown etiology but possible secondary to known history of gastritis/gastric ulcer disease. Patient does state she previously had diarrhea but that has now resolved. She has associated malaise. Nausea improved. No vomiting currently but she has not taken anything by mouth. EGD mostly unremarkable; biopsy obtained -Continue Zofran prn -Continue Protonix BID -Outpatient GI follow-up and follow-up biopsy results  Generalized malaise No definitive source. Patient reports on-off issues over the last year with not feeling well. She has seen her PCP for this however she keeps getting different providers. -ESR, CRP, ANA  Atypical chest pain Appears related to GI system. ACS workup negative. Per patient, similar to gastritis pain.  Acute respiratory failure with hypoxia Unknown etiology. Placed on oxygen secondary to an SpO2 of 86%. Oxygen saturation looks adequate today. CTA chest negative for acute process, including no PE. -Wean to room air  CAD -Continue aspirin  AKI Mild with a peak creatinine of 1.08. Baseline  creatinine of 0.62. Resolved with IV fluids.  Lactic acidosis Mild with a peak of 2 and resolved with IV fluids.  Essential hypertension Patient is on metoprolol as an outpatient. -Continue Metoprolol once diet ordered  Chest wall pain Unsure of etiology. Possibly musculoskeletal. No evidence of rash to suggest shingles   DVT prophylaxis: SCDs Code Status:   Code Status: Full Code Family Communication: None at bedside Disposition Plan: Discharge in 24 hours pending ESR, CRP, ANA in addition to PT recommendations   Consultants:   Gastroenterology  Procedures:   UPPER GI ENDOSCOPY Impression:            - Normal duodenal bulb and second portion of the                         duodenum. Biopsied.                        - Erythematous mucosa in the prepyloric region of the                         stomach.                        - Normal gastric body, incisura and antrum. Biopsied.                        - Esophagogastric landmarks identified.                        - Normal gastroesophageal  junction and esophagus. Recommendation:        - Await pathology results.                        - Return patient to hospital ward for ongoing care.                        - Use a proton pump inhibitor PO BID.                        - Return to GI clinic as previously scheduled.  Antimicrobials:  None    Subjective: Nausea resolved. Still with malaise and some new pain underneath left breast  Objective: Vitals:   11/04/19 1700 11/04/19 1757 11/04/19 1953 11/05/19 0439  BP: (!) 136/103 (!) 137/69 (!) 144/72 (!) 98/57  Pulse:  67 101 75  Resp:   20 16  Temp:  97.6 F (36.4 C) 98 F (36.7 C) 97.9 F (36.6 C)  TempSrc:  Oral Oral Oral  SpO2:  92% 94% 93%  Weight:      Height:        Intake/Output Summary (Last 24 hours) at 11/05/2019 1512 Last data filed at 11/05/2019 0439 Gross per 24 hour  Intake 715.92 ml  Output --  Net 715.92 ml   Filed Weights   11/03/19 1538   Weight: (!) 99.3 kg    Examination:  General exam: Appears calm and comfortable Respiratory system: Clear to auscultation. Respiratory effort normal. Cardiovascular system: S1 & S2 heard, RRR. No murmurs, rubs, gallops or clicks. Chest wall: tenderness over ribs under breast on left side with no overlying rash noted Gastrointestinal system: Abdomen is soft and nontender. No organomegaly or masses felt. Normal bowel sounds heard. Central nervous system: Alert and oriented. No focal neurological deficits. Musculoskeletal: No edema. No calf tenderness Skin: No cyanosis. No rashes Psychiatry: Judgement and insight appear normal. Mood & affect appropriate.     Data Reviewed: I have personally reviewed following labs and imaging studies  CBC Lab Results  Component Value Date   WBC 9.1 11/04/2019   RBC 3.85 (L) 11/04/2019   HGB 12.3 11/04/2019   HCT 39.7 11/04/2019   MCV 103.1 (H) 11/04/2019   MCH 31.9 11/04/2019   PLT 301 11/04/2019   MCHC 31.0 11/04/2019   RDW 13.2 11/04/2019   LYMPHSABS 2.1 11/03/2019   MONOABS 0.5 11/03/2019   EOSABS 0.0 11/03/2019   BASOSABS 0.0 54/00/8676     Last metabolic panel Lab Results  Component Value Date   NA 142 11/04/2019   K 4.2 11/04/2019   CL 104 11/04/2019   CO2 27 11/04/2019   BUN 17 11/04/2019   CREATININE 0.76 11/04/2019   GLUCOSE 141 (H) 11/04/2019   GFRNONAA >60 11/04/2019   GFRAA >60 11/04/2019   CALCIUM 8.6 (L) 11/04/2019   PHOS 2.8 05/17/2018   PROT 7.4 11/03/2019   ALBUMIN 4.0 11/03/2019   BILITOT 0.5 11/03/2019   ALKPHOS 63 11/03/2019   AST 28 11/03/2019   ALT 18 11/03/2019   ANIONGAP 11 11/04/2019    CBG (last 3)  Recent Labs    11/04/19 1416  GLUCAP 106*     GFR: Estimated Creatinine Clearance: 84 mL/min (by C-G formula based on SCr of 0.76 mg/dL).  Coagulation Profile: No results for input(s): INR, PROTIME in the last 168 hours.  Recent Results (from the past 240 hour(s))  SARS Coronavirus 2  by RT  PCR (hospital order, performed in [facility] hospital lab) Nasopharyngeal Nasopharyngeal Swab     Status: None   Collection Time: 11/03/19  6:47 PM   Specimen: Nasopharyngeal Swab  Result Value Ref Range Status   SARS Coronavirus 2 NEGATIVE NEGATIVE Final    Comment: (NOTE) SARS-CoV-2 target nucleic acids are NOT DETECTED.  The SARS-CoV-2 RNA is generally detectable in upper and lower respiratory specimens during the acute phase of infection. The lowest concentration of SARS-CoV-2 viral copies this assay can detect is 250 copies / mL. A negative result does not preclude SARS-CoV-2 infection and should not be used as the sole basis for treatment or other patient management decisions.  A negative result may occur with improper specimen collection / handling, submission of specimen other than nasopharyngeal swab, presence of viral mutation(s) within the areas targeted by this assay, and inadequate number of viral copies (<250 copies / mL). A negative result must be combined with clinical observations, patient history, and epidemiological information.  Fact Sheet for Patients:   StrictlyIdeas.no  Fact Sheet for Healthcare Providers: BankingDealers.co.za  This test is not yet approved or  cleared by the [place] FDA and has been authorized for detection and/or diagnosis of SARS-CoV-2 by FDA under an Emergency Use Authorization (EUA).  This EUA will remain in effect (meaning this test can be used) for the duration of the COVID-19 declaration under Section 564(b)(1) of the Act, 21 U.S.C. section 360bbb-3(b)(1), unless the authorization is terminated or revoked sooner.  Performed at [facility], [address] [place], [place] [address]         Radiology Studies: DG Chest 2 View  Result Date: 11/03/2019 CLINICAL DATA:  Chest pain, vomiting, coffee-ground emesis EXAM: CHEST - 2 VIEW COMPARISON:  10/24/2019 FINDINGS:  Minimal lingular atelectasis or scarring. The lungs are otherwise clear. No pneumothorax or pleural effusion. Cardiac size within normal limits. Pulmonary vascularity normal. No acute bone abnormality. IMPRESSION: No active cardiopulmonary disease. Electronically Signed   By: Fidela Salisbury MD   On: 11/03/2019 18:05   CT Angio Chest PE W/Cm &/Or Wo Cm  Result Date: 11/03/2019 CLINICAL DATA:  Epigastric and chest pain with 2 days of emesis, cough a ground emesis, wheezing EXAM: CT ANGIOGRAPHY CHEST CT ABDOMEN AND PELVIS WITH CONTRAST TECHNIQUE: Multidetector CT imaging of the chest was performed using the standard protocol during bolus administration of intravenous contrast. Multiplanar CT image reconstructions and MIPs were obtained to evaluate the vascular anatomy. Multidetector CT imaging of the abdomen and pelvis was performed using the standard protocol during bolus administration of intravenous contrast. CONTRAST:  56m OMNIPAQUE IOHEXOL 350 MG/ML SOLN COMPARISON:  Chest CT 01/28/2019, chest radiograph 11/03/2019, CT chest, abdomen and pelvis 05/28/2018 FINDINGS: CTA CHEST FINDINGS Cardiovascular: Satisfactory opacification the pulmonary arteries to the segmental level. No pulmonary artery filling defects are identified. Central pulmonary arteries are normal caliber. Normal heart size. No pericardial effusion. Three-vessel coronary artery calcifications are present. Atherosclerotic plaque within the normal caliber aorta. No acute luminal abnormality. No periaortic stranding or hemorrhage. Normal 3 vessel branching of the aortic arch with calcified plaque within the proximal great vessels. No major venous abnormalities. Mediastinum/Nodes: Few mildly prominent mediastinal and hilar nodes including a larger 10 mm precarinal node (2/29), and 10 mm right hilar node (2/36) are not significantly changed from prior. No new enlarged or enlarging worrisome mediastinal, hilar or axillary lymph nodes. No mediastinal  fluid or gas. Normal thyroid gland and thoracic inlet. Lungs/Pleura: Respiratory motion  degraded images. No consolidation, features of edema, pneumothorax, or effusion. Dependent atelectatic changes posteriorly. Some mosaic areas of attenuation in the lungs may reflect areas of air trapping or small airways disease. Musculoskeletal: No acute or worrisome osseous abnormalities in the spine. Stable mild anterior wedging at T12 unchanged from prior with superimposed Schmorl's node formations. Mild multilevel degenerative changes with slightly exaggerated thoracic kyphosis. Additional degenerative changes in the shoulders including a mineralized joint body in the medial recess of the left shoulder. No worrisome chest wall lesions. Review of the MIP images confirms the above findings. CT ABDOMEN and PELVIS FINDINGS Hepatobiliary: No worrisome focal liver lesions. Smooth liver surface contour. Normal hepatic attenuation. Gallbladder largely decompressed at the time of exam imaging with wall thickening appropriate for this degree of distention. No visible calcified gallstones or biliary ductal dilatation. No pericholecystic fluid or inflammation. Pancreas: Unremarkable. No pancreatic ductal dilatation or surrounding inflammatory changes. Spleen: Normal in size. No concerning splenic lesions. Adrenals/Urinary Tract: Normal adrenal glands. Kidneys enhance and excrete symmetrically. No concerning renal lesions. No urolithiasis or hydronephrosis. Urinary bladder is unremarkable. Stomach/Bowel: Distal esophagus, stomach and duodenal sweep are unremarkable. No small bowel wall thickening or dilatation. No evidence of obstruction. Small appendiceal stump/remnant in the right lower quadrant (7/33-36) with chart indicating prior appendectomy. Slight mobility of the cecum without evidence of volvulus. No colonic dilatation or wall thickening. Scattered colonic diverticula without focal inflammation to suggest diverticulitis.  Vascular/Lymphatic: Atherosclerotic calcifications within the abdominal aorta and branch vessels. Multilobular fusiform ectasia of the infrarenal abdominal aorta with maximal diameter measuring up to 3.2 cm in size. No other aneurysm or ectasia. No enlarged abdominopelvic lymph nodes. Reproductive: Slightly retroverted uterus. No concerning adnexal lesions. Other: No abdominopelvic free fluid or free gas. No bowel containing hernias. Small fat containing umbilical hernia. Musculoskeletal: No acute osseous abnormality or suspicious osseous lesion. Multilevel degenerative changes are present in the imaged portions of the spine. Mild degenerative changes in the hips and pelvis. Review of the MIP images confirms the above findings. IMPRESSION: 1. No evidence of pulmonary embolism. 2. Mosaic areas of attenuation in the lungs may reflect areas of air trapping or small airways disease. 3. No other acute findings in the chest, abdomen or pelvis to explain the patient's symptoms. 4. Multilobular fusiform ectasia of the infrarenal abdominal aorta with maximal diameter measuring up to 3.2 cm in size. Recommend followup by ultrasound in 3 years. This recommendation follows ACR consensus guidelines: White Paper of the ACR Incidental Findings Committee II on Vascular Findings. J Am Coll Radiol 2013; 10:789-794. Aortic aneurysm NOS (ICD10-I71.9) 5. Three-vessel coronary artery calcifications are present. Please note that the presence of coronary artery calcium documents the presence of coronary artery disease, the severity of this disease and any potential stenosis cannot be assessed on this non-gated CT examination. Assessment for potential risk factor modification, dietary therapy or pharmacologic therapy may be warranted. 6. Aortic Atherosclerosis (ICD10-I70.0). Electronically Signed   By: Lovena Le M.D.   On: 11/03/2019 19:11   CT Abdomen Pelvis W Contrast  Result Date: 11/03/2019 CLINICAL DATA:  Epigastric and chest  pain with 2 days of emesis, cough a ground emesis, wheezing EXAM: CT ANGIOGRAPHY CHEST CT ABDOMEN AND PELVIS WITH CONTRAST TECHNIQUE: Multidetector CT imaging of the chest was performed using the standard protocol during bolus administration of intravenous contrast. Multiplanar CT image reconstructions and MIPs were obtained to evaluate the vascular anatomy. Multidetector CT imaging of the abdomen and pelvis was performed using the standard protocol during  bolus administration of intravenous contrast. CONTRAST:  8m OMNIPAQUE IOHEXOL 350 MG/ML SOLN COMPARISON:  Chest CT 01/28/2019, chest radiograph 11/03/2019, CT chest, abdomen and pelvis 05/28/2018 FINDINGS: CTA CHEST FINDINGS Cardiovascular: Satisfactory opacification the pulmonary arteries to the segmental level. No pulmonary artery filling defects are identified. Central pulmonary arteries are normal caliber. Normal heart size. No pericardial effusion. Three-vessel coronary artery calcifications are present. Atherosclerotic plaque within the normal caliber aorta. No acute luminal abnormality. No periaortic stranding or hemorrhage. Normal 3 vessel branching of the aortic arch with calcified plaque within the proximal great vessels. No major venous abnormalities. Mediastinum/Nodes: Few mildly prominent mediastinal and hilar nodes including a larger 10 mm precarinal node (2/29), and 10 mm right hilar node (2/36) are not significantly changed from prior. No new enlarged or enlarging worrisome mediastinal, hilar or axillary lymph nodes. No mediastinal fluid or gas. Normal thyroid gland and thoracic inlet. Lungs/Pleura: Respiratory motion degraded images. No consolidation, features of edema, pneumothorax, or effusion. Dependent atelectatic changes posteriorly. Some mosaic areas of attenuation in the lungs may reflect areas of air trapping or small airways disease. Musculoskeletal: No acute or worrisome osseous abnormalities in the spine. Stable mild anterior wedging  at T12 unchanged from prior with superimposed Schmorl's node formations. Mild multilevel degenerative changes with slightly exaggerated thoracic kyphosis. Additional degenerative changes in the shoulders including a mineralized joint body in the medial recess of the left shoulder. No worrisome chest wall lesions. Review of the MIP images confirms the above findings. CT ABDOMEN and PELVIS FINDINGS Hepatobiliary: No worrisome focal liver lesions. Smooth liver surface contour. Normal hepatic attenuation. Gallbladder largely decompressed at the time of exam imaging with wall thickening appropriate for this degree of distention. No visible calcified gallstones or biliary ductal dilatation. No pericholecystic fluid or inflammation. Pancreas: Unremarkable. No pancreatic ductal dilatation or surrounding inflammatory changes. Spleen: Normal in size. No concerning splenic lesions. Adrenals/Urinary Tract: Normal adrenal glands. Kidneys enhance and excrete symmetrically. No concerning renal lesions. No urolithiasis or hydronephrosis. Urinary bladder is unremarkable. Stomach/Bowel: Distal esophagus, stomach and duodenal sweep are unremarkable. No small bowel wall thickening or dilatation. No evidence of obstruction. Small appendiceal stump/remnant in the right lower quadrant (7/33-36) with chart indicating prior appendectomy. Slight mobility of the cecum without evidence of volvulus. No colonic dilatation or wall thickening. Scattered colonic diverticula without focal inflammation to suggest diverticulitis. Vascular/Lymphatic: Atherosclerotic calcifications within the abdominal aorta and branch vessels. Multilobular fusiform ectasia of the infrarenal abdominal aorta with maximal diameter measuring up to 3.2 cm in size. No other aneurysm or ectasia. No enlarged abdominopelvic lymph nodes. Reproductive: Slightly retroverted uterus. No concerning adnexal lesions. Other: No abdominopelvic free fluid or free gas. No bowel containing  hernias. Small fat containing umbilical hernia. Musculoskeletal: No acute osseous abnormality or suspicious osseous lesion. Multilevel degenerative changes are present in the imaged portions of the spine. Mild degenerative changes in the hips and pelvis. Review of the MIP images confirms the above findings. IMPRESSION: 1. No evidence of pulmonary embolism. 2. Mosaic areas of attenuation in the lungs may reflect areas of air trapping or small airways disease. 3. No other acute findings in the chest, abdomen or pelvis to explain the patient's symptoms. 4. Multilobular fusiform ectasia of the infrarenal abdominal aorta with maximal diameter measuring up to 3.2 cm in size. Recommend followup by ultrasound in 3 years. This recommendation follows ACR consensus guidelines: White Paper of the ACR Incidental Findings Committee II on Vascular Findings. J Am Coll Radiol 2013; 10:789-794. Aortic aneurysm NOS (ICD10-I71.9)  5. Three-vessel coronary artery calcifications are present. Please note that the presence of coronary artery calcium documents the presence of coronary artery disease, the severity of this disease and any potential stenosis cannot be assessed on this non-gated CT examination. Assessment for potential risk factor modification, dietary therapy or pharmacologic therapy may be warranted. 6. Aortic Atherosclerosis (ICD10-I70.0). Electronically Signed   By: Lovena Le M.D.   On: 11/03/2019 19:11   DG Abd Portable 1V  Result Date: 11/04/2019 CLINICAL DATA:  Abdominal distension and vomiting. EXAM: PORTABLE ABDOMEN - 1 VIEW COMPARISON:  07/20/2014 and abdomen and pelvis CT obtained yesterday. FINDINGS: Normal bowel gas pattern. Atheromatous arterial calcifications. Lower thoracic spine degenerative changes. IMPRESSION: No acute abnormality. Electronically Signed   By: Claudie Revering M.D.   On: 11/04/2019 12:20        Scheduled Meds: . aspirin EC  81 mg Oral Daily  . metoprolol tartrate  50 mg Oral BID  .  pantoprazole  40 mg Oral Daily   Continuous Infusions:    LOS: 1 day     Cordelia Poche, MD [facility] 11/05/2019, 3:12 PM  If 7PM-7AM, please contact night-coverage [email or web address]

## 2019-11-06 LAB — C-REACTIVE PROTEIN: CRP: 0.9 mg/dL (ref <1.0)

## 2019-11-06 NOTE — Progress Notes (Signed)
Linda Mejia  A and O x 4 VSS. Pt tolerating diet well. No complaints of pain or nausea. IV removed intact, prescriptions given. Pt voices understanding of discharge instructions with no further questions. Pt discharged via wheelchair with axillary.   Allergies as of 11/06/2019      Reactions   Amlodipine    Leg swelling   Statins Other (See Comments)      Medication List    TAKE these medications   acyclovir 400 MG tablet Commonly known as: ZOVIRAX Take 1 tablet (400 mg total) by mouth 2 (two) times daily.   amphetamine-dextroamphetamine 30 MG tablet Commonly known as: ADDERALL Take 30 mg by mouth 2 (two) times daily.   aspirin EC 81 MG tablet Take 81 mg by mouth daily.   cyanocobalamin 1000 MCG tablet Take 1,000 mcg by mouth daily.   diazepam 10 MG tablet Commonly known as: VALIUM Take 1 tablet (10 mg total) by mouth every 8 (eight) hours as needed for anxiety.   fluticasone 50 MCG/ACT nasal spray Commonly known as: FLONASE Place 2 sprays into both nostrils daily as needed for allergies.   metoprolol tartrate 50 MG tablet Commonly known as: LOPRESSOR TAKE 1 TABLET BY MOUTH TWICE DAILY   nitroGLYCERIN 0.4 MG SL tablet Commonly known as: NITROSTAT Place 1 tablet (0.4 mg total) under the tongue every 5 (five) minutes as needed for chest pain.   nystatin powder Commonly known as: MYCOSTATIN/NYSTOP Apply topically 2 (two) times daily. What changed:   when to take this  reasons to take this   pantoprazole 40 MG tablet Commonly known as: PROTONIX Take 1 tablet (40 mg total) by mouth daily.   Praluent 75 MG/ML Soaj Generic drug: Alirocumab Inject 1 each into the skin every 14 (fourteen) days.   predniSONE 5 MG tablet Commonly known as: DELTASONE Take 5-30 mg by mouth daily.       Vitals:   11/06/19 0512 11/06/19 1135  BP: (!) 123/60 128/77  Pulse: 74 63  Resp: 16 16  Temp: 98.7 F (37.1 C) 98.2 F (36.8 C)  SpO2: 91% 97%    Linda Mejia

## 2019-11-06 NOTE — Progress Notes (Signed)
OT Cancellation Note  Patient Details Name: Linda Mejia MRN: [id number] DOB: [birth year]   Cancelled Treatment:    Reason Eval/Treat Not Completed: OT screened, no needs identified, will sign off. OT order received and chart reviewed. Per PT and conversation c pt, pt is at baseline for ADLs. Denies need for OT services at this time. MD I room reports pt will d/c home today. Will sign off at this time, please re-consult if new needs arise.  Dessie Coma, M.S. OTR/L  11/06/19, 12:00 PM

## 2019-11-06 NOTE — Discharge Instructions (Signed)
Linda Mejia,  You were in the hospital because of malaise, chest pain and coffee ground emesis. Thankfully there was no bleeding noted in your EGD. Your workup was unremarkable but it may be beneficial to pursue an autoimmune workup as previously recommended.

## 2019-11-06 NOTE — Discharge Summary (Signed)
Physician Discharge Summary  Linda Mejia TDH:[id number] DOB: 09/24/1955 DOA: 11/03/2019  PCP: Towanda Malkin, MD  Admit date: 11/03/2019 Discharge date: 11/06/2019  Admitted From: Home Disposition: Home  Recommendations for Outpatient Follow-up:  1. Follow up with PCP in 1 week 2. Please follow up on the following pending results: ANA, EGD biopsy  Home Health: Outpatient PT Equipment/Devices: None  Discharge Condition: Stable CODE STATUS: Full code Diet recommendation: Heart healthy   Brief/Interim Summary:  Admission HPI written by Athena Masse, MD   Chief Complaint: Nausea, vomiting, malaise  HPI: Linda Mejia is a 64 y.o. female with medical history significant for CAD, anxiety, HTN, PUD, who presents to the emergency room with a 1 week history of malaise, body aches, subjective fever, nausea with a few episodes of emesis, one with coffee-ground, since resolved, with 1 day history of left-sided chest pain, located under the left breast, described as sharp and stabbing, nonradiating of moderate to severe intensity with no aggravating or alleviating factors and no associated shortness of breath or diaphoresis.  She has no cough.  Patient saw her PCP in the last few days and was found to have an elevated ANA and was started empirically on prednisone.  She also was seen by her gastroenterologist on 10/27/2019 with a complaint of abdominal pain and the plan was for repeat EGD and screening.  Her last EGD was April 2018 for hematemesis when she had 3 nonbleeding gastric antral ulcers.  ED Course: On arrival in the emergency room she was tachycardic and had a low-grade temperature of 99.1.  Patient was noted to desat to 86% on room air while in the ER requiring O2 at 2 L to get his sats up to the mid 90s.Marland Kitchen  Her blood work was significant for creatinine of 1.08, up from 0.73 and she had a lactic acid of 2.  Lipase normal at 35.  WBC 8700, hemoglobin normal at 13.3.  Troponin IV.  EKG  with no acute ST-T wave changes.  CTA chest negative for PE.  CT abdomen and pelvis with contrast with no findings to explain symptoms.  Hospitalist consulted for admission.   Hospital course:  Nausea/vomiting Unknown etiology but possible secondary to known history of gastritis/gastric ulcer disease. Patient does state she previously had diarrhea but that has now resolved. She has associated malaise. Nausea improved. No vomiting currently but she has not taken anything by mouth. EGD mostly unremarkable; biopsy obtained and pending.   Generalized malaise No definitive source. Patient reports on-off issues over the last year with not feeling well. She has seen her PCP for this however she keeps getting different providers. ESR/CRP normal. ANA pending.  Atypical chest pain Appears related to GI system. ACS workup negative. Per patient, similar to gastritis pain.  Acute respiratory failure with hypoxia Unknown etiology. Placed on oxygen secondary to an SpO2 of 86%. Oxygen saturation looks adequate today. CTA chest negative for acute process, including no PE.  CAD Continue aspirin  AKI Mild with a peak creatinine of 1.08. Baseline creatinine of 0.62. Resolved with IV fluids.  Lactic acidosis Mild with a peak of 2 and resolved with IV fluids.  Essential hypertension Continue metoprolol  Chest wall pain Unsure of etiology. Possibly musculoskeletal. No evidence of rash to suggest shingles   Discharge Diagnoses:  Principal Problem:   Nausea and vomiting Active Problems:   Coronary artery disease involving native coronary artery of native heart with angina pectoris ([place])   Hypertension  goal BP (blood pressure) < 140/90   History of gastric ulcer   Subclinical hypothyroidism   Chest pain   Acute respiratory failure with hypoxia ([facility])   Lactic acidosis   AKI (acute kidney injury) ([place])   Malaise   Hypoxia   Abdominal distension    Discharge Instructions  Discharge  Instructions    Ambulatory referral to Physical Therapy   Complete by: As directed      Allergies as of 11/06/2019      Reactions   Amlodipine    Leg swelling   Statins Other (See Comments)      Medication List    TAKE these medications   acyclovir 400 MG tablet Commonly known as: ZOVIRAX Take 1 tablet (400 mg total) by mouth 2 (two) times daily.   amphetamine-dextroamphetamine 30 MG tablet Commonly known as: ADDERALL Take 30 mg by mouth 2 (two) times daily.   aspirin EC 81 MG tablet Take 81 mg by mouth daily.   cyanocobalamin 1000 MCG tablet Take 1,000 mcg by mouth daily.   diazepam 10 MG tablet Commonly known as: VALIUM Take 1 tablet (10 mg total) by mouth every 8 (eight) hours as needed for anxiety.   fluticasone 50 MCG/ACT nasal spray Commonly known as: FLONASE Place 2 sprays into both nostrils daily as needed for allergies.   metoprolol tartrate 50 MG tablet Commonly known as: LOPRESSOR TAKE 1 TABLET BY MOUTH TWICE DAILY   nitroGLYCERIN 0.4 MG SL tablet Commonly known as: NITROSTAT Place 1 tablet (0.4 mg total) under the tongue every 5 (five) minutes as needed for chest pain.   nystatin powder Commonly known as: MYCOSTATIN/NYSTOP Apply topically 2 (two) times daily. What changed:   when to take this  reasons to take this   pantoprazole 40 MG tablet Commonly known as: PROTONIX Take 1 tablet (40 mg total) by mouth daily.   Praluent 75 MG/ML Soaj Generic drug: Alirocumab Inject 1 each into the skin every 14 (fourteen) days.   predniSONE 5 MG tablet Commonly known as: DELTASONE Take 5-30 mg by mouth daily.       Follow-up Information    Towanda Malkin, MD. Schedule an appointment as soon as possible for a visit in 1 week(s).   Specialty: Internal Medicine Why: Hospital follow-up Contact information: [address] [place] [address] [phone]              Allergies  Allergen Reactions  . Amlodipine      Leg swelling  . Statins Other (See Comments)    Consultations:  Gastroenterology   Procedures/Studies: DG Chest 2 View  Result Date: 11/03/2019 CLINICAL DATA:  Chest pain, vomiting, coffee-ground emesis EXAM: CHEST - 2 VIEW COMPARISON:  10/24/2019 FINDINGS: Minimal lingular atelectasis or scarring. The lungs are otherwise clear. No pneumothorax or pleural effusion. Cardiac size within normal limits. Pulmonary vascularity normal. No acute bone abnormality. IMPRESSION: No active cardiopulmonary disease. Electronically Signed   By: Fidela Salisbury MD   On: 11/03/2019 18:05   DG Chest 2 View  Result Date: 10/24/2019 CLINICAL DATA:  Shortness of breath over the last 2 weeks. EXAM: CHEST - 2 VIEW COMPARISON:  08/26/2019 FINDINGS: Heart size is normal. Coronary artery calcification and stent. Aortic atherosclerotic calcification. Pulmonary vascularity is normal. The lungs are clear. No acute bone finding. Ordinary thoracic degenerative changes. Probable loose body left shoulder. IMPRESSION: No active disease. Aortic atherosclerosis. Coronary artery calcification and stent. Electronically Signed   By: Jan Fireman.D.  On: 10/24/2019 12:57   CT Angio Chest PE W/Cm &/Or Wo Cm  Result Date: 11/03/2019 CLINICAL DATA:  Epigastric and chest pain with 2 days of emesis, cough a ground emesis, wheezing EXAM: CT ANGIOGRAPHY CHEST CT ABDOMEN AND PELVIS WITH CONTRAST TECHNIQUE: Multidetector CT imaging of the chest was performed using the standard protocol during bolus administration of intravenous contrast. Multiplanar CT image reconstructions and MIPs were obtained to evaluate the vascular anatomy. Multidetector CT imaging of the abdomen and pelvis was performed using the standard protocol during bolus administration of intravenous contrast. CONTRAST:  43m OMNIPAQUE IOHEXOL 350 MG/ML SOLN COMPARISON:  Chest CT 01/28/2019, chest radiograph 11/03/2019, CT chest, abdomen and pelvis 05/28/2018 FINDINGS: CTA CHEST  FINDINGS Cardiovascular: Satisfactory opacification the pulmonary arteries to the segmental level. No pulmonary artery filling defects are identified. Central pulmonary arteries are normal caliber. Normal heart size. No pericardial effusion. Three-vessel coronary artery calcifications are present. Atherosclerotic plaque within the normal caliber aorta. No acute luminal abnormality. No periaortic stranding or hemorrhage. Normal 3 vessel branching of the aortic arch with calcified plaque within the proximal great vessels. No major venous abnormalities. Mediastinum/Nodes: Few mildly prominent mediastinal and hilar nodes including a larger 10 mm precarinal node (2/29), and 10 mm right hilar node (2/36) are not significantly changed from prior. No new enlarged or enlarging worrisome mediastinal, hilar or axillary lymph nodes. No mediastinal fluid or gas. Normal thyroid gland and thoracic inlet. Lungs/Pleura: Respiratory motion degraded images. No consolidation, features of edema, pneumothorax, or effusion. Dependent atelectatic changes posteriorly. Some mosaic areas of attenuation in the lungs may reflect areas of air trapping or small airways disease. Musculoskeletal: No acute or worrisome osseous abnormalities in the spine. Stable mild anterior wedging at T12 unchanged from prior with superimposed Schmorl's node formations. Mild multilevel degenerative changes with slightly exaggerated thoracic kyphosis. Additional degenerative changes in the shoulders including a mineralized joint body in the medial recess of the left shoulder. No worrisome chest wall lesions. Review of the MIP images confirms the above findings. CT ABDOMEN and PELVIS FINDINGS Hepatobiliary: No worrisome focal liver lesions. Smooth liver surface contour. Normal hepatic attenuation. Gallbladder largely decompressed at the time of exam imaging with wall thickening appropriate for this degree of distention. No visible calcified gallstones or biliary  ductal dilatation. No pericholecystic fluid or inflammation. Pancreas: Unremarkable. No pancreatic ductal dilatation or surrounding inflammatory changes. Spleen: Normal in size. No concerning splenic lesions. Adrenals/Urinary Tract: Normal adrenal glands. Kidneys enhance and excrete symmetrically. No concerning renal lesions. No urolithiasis or hydronephrosis. Urinary bladder is unremarkable. Stomach/Bowel: Distal esophagus, stomach and duodenal sweep are unremarkable. No small bowel wall thickening or dilatation. No evidence of obstruction. Small appendiceal stump/remnant in the right lower quadrant (7/33-36) with chart indicating prior appendectomy. Slight mobility of the cecum without evidence of volvulus. No colonic dilatation or wall thickening. Scattered colonic diverticula without focal inflammation to suggest diverticulitis. Vascular/Lymphatic: Atherosclerotic calcifications within the abdominal aorta and branch vessels. Multilobular fusiform ectasia of the infrarenal abdominal aorta with maximal diameter measuring up to 3.2 cm in size. No other aneurysm or ectasia. No enlarged abdominopelvic lymph nodes. Reproductive: Slightly retroverted uterus. No concerning adnexal lesions. Other: No abdominopelvic free fluid or free gas. No bowel containing hernias. Small fat containing umbilical hernia. Musculoskeletal: No acute osseous abnormality or suspicious osseous lesion. Multilevel degenerative changes are present in the imaged portions of the spine. Mild degenerative changes in the hips and pelvis. Review of the MIP images confirms the above findings. IMPRESSION: 1. No evidence  of pulmonary embolism. 2. Mosaic areas of attenuation in the lungs may reflect areas of air trapping or small airways disease. 3. No other acute findings in the chest, abdomen or pelvis to explain the patient's symptoms. 4. Multilobular fusiform ectasia of the infrarenal abdominal aorta with maximal diameter measuring up to 3.2 cm in  size. Recommend followup by ultrasound in 3 years. This recommendation follows ACR consensus guidelines: White Paper of the ACR Incidental Findings Committee II on Vascular Findings. J Am Coll Radiol 2013; 10:789-794. Aortic aneurysm NOS (ICD10-I71.9) 5. Three-vessel coronary artery calcifications are present. Please note that the presence of coronary artery calcium documents the presence of coronary artery disease, the severity of this disease and any potential stenosis cannot be assessed on this non-gated CT examination. Assessment for potential risk factor modification, dietary therapy or pharmacologic therapy may be warranted. 6. Aortic Atherosclerosis (ICD10-I70.0). Electronically Signed   By: Lovena Le M.D.   On: 11/03/2019 19:11   CT Abdomen Pelvis W Contrast  Result Date: 11/03/2019 CLINICAL DATA:  Epigastric and chest pain with 2 days of emesis, cough a ground emesis, wheezing EXAM: CT ANGIOGRAPHY CHEST CT ABDOMEN AND PELVIS WITH CONTRAST TECHNIQUE: Multidetector CT imaging of the chest was performed using the standard protocol during bolus administration of intravenous contrast. Multiplanar CT image reconstructions and MIPs were obtained to evaluate the vascular anatomy. Multidetector CT imaging of the abdomen and pelvis was performed using the standard protocol during bolus administration of intravenous contrast. CONTRAST:  82m OMNIPAQUE IOHEXOL 350 MG/ML SOLN COMPARISON:  Chest CT 01/28/2019, chest radiograph 11/03/2019, CT chest, abdomen and pelvis 05/28/2018 FINDINGS: CTA CHEST FINDINGS Cardiovascular: Satisfactory opacification the pulmonary arteries to the segmental level. No pulmonary artery filling defects are identified. Central pulmonary arteries are normal caliber. Normal heart size. No pericardial effusion. Three-vessel coronary artery calcifications are present. Atherosclerotic plaque within the normal caliber aorta. No acute luminal abnormality. No periaortic stranding or hemorrhage.  Normal 3 vessel branching of the aortic arch with calcified plaque within the proximal great vessels. No major venous abnormalities. Mediastinum/Nodes: Few mildly prominent mediastinal and hilar nodes including a larger 10 mm precarinal node (2/29), and 10 mm right hilar node (2/36) are not significantly changed from prior. No new enlarged or enlarging worrisome mediastinal, hilar or axillary lymph nodes. No mediastinal fluid or gas. Normal thyroid gland and thoracic inlet. Lungs/Pleura: Respiratory motion degraded images. No consolidation, features of edema, pneumothorax, or effusion. Dependent atelectatic changes posteriorly. Some mosaic areas of attenuation in the lungs may reflect areas of air trapping or small airways disease. Musculoskeletal: No acute or worrisome osseous abnormalities in the spine. Stable mild anterior wedging at T12 unchanged from prior with superimposed Schmorl's node formations. Mild multilevel degenerative changes with slightly exaggerated thoracic kyphosis. Additional degenerative changes in the shoulders including a mineralized joint body in the medial recess of the left shoulder. No worrisome chest wall lesions. Review of the MIP images confirms the above findings. CT ABDOMEN and PELVIS FINDINGS Hepatobiliary: No worrisome focal liver lesions. Smooth liver surface contour. Normal hepatic attenuation. Gallbladder largely decompressed at the time of exam imaging with wall thickening appropriate for this degree of distention. No visible calcified gallstones or biliary ductal dilatation. No pericholecystic fluid or inflammation. Pancreas: Unremarkable. No pancreatic ductal dilatation or surrounding inflammatory changes. Spleen: Normal in size. No concerning splenic lesions. Adrenals/Urinary Tract: Normal adrenal glands. Kidneys enhance and excrete symmetrically. No concerning renal lesions. No urolithiasis or hydronephrosis. Urinary bladder is unremarkable. Stomach/Bowel: Distal esophagus,  stomach and  duodenal sweep are unremarkable. No small bowel wall thickening or dilatation. No evidence of obstruction. Small appendiceal stump/remnant in the right lower quadrant (7/33-36) with chart indicating prior appendectomy. Slight mobility of the cecum without evidence of volvulus. No colonic dilatation or wall thickening. Scattered colonic diverticula without focal inflammation to suggest diverticulitis. Vascular/Lymphatic: Atherosclerotic calcifications within the abdominal aorta and branch vessels. Multilobular fusiform ectasia of the infrarenal abdominal aorta with maximal diameter measuring up to 3.2 cm in size. No other aneurysm or ectasia. No enlarged abdominopelvic lymph nodes. Reproductive: Slightly retroverted uterus. No concerning adnexal lesions. Other: No abdominopelvic free fluid or free gas. No bowel containing hernias. Small fat containing umbilical hernia. Musculoskeletal: No acute osseous abnormality or suspicious osseous lesion. Multilevel degenerative changes are present in the imaged portions of the spine. Mild degenerative changes in the hips and pelvis. Review of the MIP images confirms the above findings. IMPRESSION: 1. No evidence of pulmonary embolism. 2. Mosaic areas of attenuation in the lungs may reflect areas of air trapping or small airways disease. 3. No other acute findings in the chest, abdomen or pelvis to explain the patient's symptoms. 4. Multilobular fusiform ectasia of the infrarenal abdominal aorta with maximal diameter measuring up to 3.2 cm in size. Recommend followup by ultrasound in 3 years. This recommendation follows ACR consensus guidelines: White Paper of the ACR Incidental Findings Committee II on Vascular Findings. J Am Coll Radiol 2013; 10:789-794. Aortic aneurysm NOS (ICD10-I71.9) 5. Three-vessel coronary artery calcifications are present. Please note that the presence of coronary artery calcium documents the presence of coronary artery disease, the severity  of this disease and any potential stenosis cannot be assessed on this non-gated CT examination. Assessment for potential risk factor modification, dietary therapy or pharmacologic therapy may be warranted. 6. Aortic Atherosclerosis (ICD10-I70.0). Electronically Signed   By: Lovena Le M.D.   On: 11/03/2019 19:11   US Venous Img Lower Bilateral  Result Date: 10/24/2019 CLINICAL DATA:  Bilateral lower extremity pain. EXAM: BILATERAL LOWER EXTREMITY VENOUS DOPPLER ULTRASOUND TECHNIQUE: Gray-scale sonography with graded compression, as well as color Doppler and duplex ultrasound were performed to evaluate the lower extremity deep venous systems from the level of the common femoral vein and including the common femoral, femoral, profunda femoral, popliteal and calf veins including the posterior tibial, peroneal and gastrocnemius veins when visible. The superficial great saphenous vein was also interrogated. Spectral Doppler was utilized to evaluate flow at rest and with distal augmentation maneuvers in the common femoral, femoral and popliteal veins. COMPARISON:  None. FINDINGS: RIGHT LOWER EXTREMITY Common Femoral Vein: No evidence of thrombus. Normal compressibility, respiratory phasicity and response to augmentation. Saphenofemoral Junction: No evidence of thrombus. Normal compressibility and flow on color Doppler imaging. Profunda Femoral Vein: No evidence of thrombus. Normal compressibility and flow on color Doppler imaging. Femoral Vein: No evidence of thrombus. Normal compressibility, respiratory phasicity and response to augmentation. Popliteal Vein: No evidence of thrombus. Normal compressibility, respiratory phasicity and response to augmentation. Calf Veins: No evidence of thrombus. Normal compressibility and flow on color Doppler imaging. Superficial Great Saphenous Vein: No evidence of thrombus. Normal compressibility. Venous Reflux:  None. Other Findings: No evidence of superficial thrombophlebitis or  abnormal fluid collection. LEFT LOWER EXTREMITY Common Femoral Vein: No evidence of thrombus. Normal compressibility, respiratory phasicity and response to augmentation. Saphenofemoral Junction: No evidence of thrombus. Normal compressibility and flow on color Doppler imaging. Profunda Femoral Vein: No evidence of thrombus. Normal compressibility and flow on color Doppler imaging. Femoral Vein: No  evidence of thrombus. Normal compressibility, respiratory phasicity and response to augmentation. Popliteal Vein: No evidence of thrombus. Normal compressibility, respiratory phasicity and response to augmentation. Calf Veins: No evidence of thrombus. Normal compressibility and flow on color Doppler imaging. Superficial Great Saphenous Vein: No evidence of thrombus. Normal compressibility. Venous Reflux:  None. Other Findings: No evidence of superficial thrombophlebitis or abnormal fluid collection. IMPRESSION: No evidence of deep venous thrombosis in either lower extremity. Electronically Signed   By: Aletta Edouard M.D.   On: 10/24/2019 13:40   DG Abd Portable 1V  Result Date: 11/04/2019 CLINICAL DATA:  Abdominal distension and vomiting. EXAM: PORTABLE ABDOMEN - 1 VIEW COMPARISON:  07/20/2014 and abdomen and pelvis CT obtained yesterday. FINDINGS: Normal bowel gas pattern. Atheromatous arterial calcifications. Lower thoracic spine degenerative changes. IMPRESSION: No acute abnormality. Electronically Signed   By: Claudie Revering M.D.   On: 11/04/2019 12:20      UPPER GI ENDOSCOPY Impression: - Normal duodenal bulb and second portion of the  duodenum. Biopsied. - Erythematous mucosa in the prepyloric region of the  stomach. - Normal gastric body, incisura and antrum. Biopsied. - Esophagogastric landmarks identified. - Normal gastroesophageal junction and  esophagus. Recommendation: - Await pathology results. - Return patient to hospital ward for ongoing care. - Use a proton pump inhibitor PO BID. - Return to GI clinic as previously scheduled.   Subjective: Feeling better. Concerned that symptoms may recur in the future. She reports that she was recommended to follow-up with rheumatology for a previously positive ANA  Discharge Exam: Vitals:   11/06/19 0512 11/06/19 1135  BP: (!) 123/60 128/77  Pulse: 74 63  Resp: 16 16  Temp: 98.7 F (37.1 C) 98.2 F (36.8 C)  SpO2: 91% 97%   Vitals:   11/05/19 1831 11/05/19 1956 11/06/19 0512 11/06/19 1135  BP: 108/78 (!) 111/61 (!) 123/60 128/77  Pulse: 83 75 74 63  Resp:  _0 Temp: 98.1 F (36.7 C) 98.5 F (36.9 C) 98.7 F (37.1 C) 98.2 F (36.8 C)  TempSrc: Oral Oral Oral   SpO2: 94% 94% 91% 97%  Weight:      Height:        General: Pt is alert, awake, not in acute distress Cardiovascular: RRR, S1/S2 +, no rubs, no gallops Respiratory: CTA bilaterally, no wheezing, no rhonchi Abdominal: Soft, NT, ND, bowel sounds + Extremities: no edema, no cyanosis    The results of significant diagnostics from this hospitalization (including imaging, microbiology, ancillary and laboratory) are listed below for reference.     Microbiology: Recent Results (from the past 240 hour(s))  SARS Coronavirus 2 by RT PCR (hospital order, performed in [facility] hospital lab) Nasopharyngeal Nasopharyngeal Swab     Status: None   Collection Time: 11/03/19  6:47 PM   Specimen: Nasopharyngeal Swab  Result Value Ref Range Status   SARS Coronavirus 2 NEGATIVE NEGATIVE Final    Comment: (NOTE) SARS-CoV-2 target nucleic acids are NOT DETECTED.  The SARS-CoV-2 RNA is generally detectable in upper and lower respiratory specimens during the acute phase of infection. The lowest concentration of SARS-CoV-2 viral copies this  assay can detect is 250 copies / mL. A negative result does not preclude SARS-CoV-2 infection and should not be used as the sole basis for treatment or other patient management decisions.  A negative result may occur with improper specimen collection / handling, submission of specimen other than nasopharyngeal swab, presence of viral mutation(s) within the areas targeted by this assay,  and inadequate number of viral copies (<250 copies / mL). A negative result must be combined with clinical observations, patient history, and epidemiological information.  Fact Sheet for Patients:   StrictlyIdeas.no  Fact Sheet for Healthcare Providers: BankingDealers.co.za  This test is not yet approved or  cleared by the [place] FDA and has been authorized for detection and/or diagnosis of SARS-CoV-2 by FDA under an Emergency Use Authorization (EUA).  This EUA will remain in effect (meaning this test can be used) for the duration of the COVID-19 declaration under Section 564(b)(1) of the Act, 21 U.S.C. section 360bbb-3(b)(1), unless the authorization is terminated or revoked sooner.  Performed at [facility] [place], [address] [place], [place] [address]      Labs: BNP (last 3 results) Recent Labs    01/27/19 2128 10/24/19 0946  BNP 41.0 62.7   Basic Metabolic Panel: Recent Labs  Lab 11/03/19 1550 11/04/19 0709  NA 139 142  K 4.0 4.2  CL 103 104  CO2 25 27  GLUCOSE 153* 141*  BUN 22 17  CREATININE 1.08* 0.76  CALCIUM 8.7* 8.6*   Liver Function Tests: Recent Labs  Lab 11/03/19 1722  AST 28  ALT 18  ALKPHOS 63  BILITOT 0.5  PROT 7.4  ALBUMIN 4.0   Recent Labs  Lab 11/03/19 1722  LIPASE 35   No results for input(s): AMMONIA in the last 168 hours. CBC: Recent Labs  Lab 11/03/19 1550 11/04/19 0709  WBC 8.7 9.1  NEUTROABS 6.1  --   HGB 13.3 12.3  HCT 40.6 39.7  MCV 96.4 103.1*  PLT 343 301   Cardiac  Enzymes: No results for input(s): CKTOTAL, CKMB, CKMBINDEX, TROPONINI in the last 168 hours. BNP: Invalid input(s): POCBNP CBG: Recent Labs  Lab 11/04/19 1416  GLUCAP 106*   D-Dimer No results for input(s): DDIMER in the last 72 hours. Hgb A1c No results for input(s): HGBA1C in the last 72 hours. Lipid Profile No results for input(s): CHOL, HDL, LDLCALC, TRIG, CHOLHDL, LDLDIRECT in the last 72 hours. Thyroid function studies Recent Labs    11/03/19 1722  TSH 0.591   Anemia work up No results for input(s): VITAMINB12, FOLATE, FERRITIN, TIBC, IRON, RETICCTPCT in the last 72 hours. Urinalysis    Component Value Date/Time   COLORURINE YELLOW (A) 10/24/2019 1236   APPEARANCEUR CLEAR (A) 10/24/2019 1236   APPEARANCEUR Clear 08/03/2014 2125   LABSPEC 1.015 10/24/2019 1236   LABSPEC 1.006 08/03/2014 2125   PHURINE 5.0 10/24/2019 1236   GLUCOSEU NEGATIVE 10/24/2019 1236   GLUCOSEU Negative 08/03/2014 2125   HGBUR NEGATIVE 10/24/2019 1236   BILIRUBINUR NEGATIVE 10/24/2019 1236   BILIRUBINUR neg 06/14/2018 1059   BILIRUBINUR Negative 08/03/2014 2125   KETONESUR NEGATIVE 10/24/2019 1236   PROTEINUR NEGATIVE 10/24/2019 1236   UROBILINOGEN 0.2 06/14/2018 1059   NITRITE NEGATIVE 10/24/2019 1236   LEUKOCYTESUR NEGATIVE 10/24/2019 1236   LEUKOCYTESUR Negative 08/03/2014 2125   Sepsis Labs Invalid input(s): PROCALCITONIN,  WBC,  LACTICIDVEN Microbiology Recent Results (from the past 240 hour(s))  SARS Coronavirus 2 by RT PCR (hospital order, performed in [place] hospital lab) Nasopharyngeal Nasopharyngeal Swab     Status: None   Collection Time: 11/03/19  6:47 PM   Specimen: Nasopharyngeal Swab  Result Value Ref Range Status   SARS Coronavirus 2 NEGATIVE NEGATIVE Final    Comment: (NOTE) SARS-CoV-2 target nucleic acids are NOT DETECTED.  The SARS-CoV-2 RNA is generally detectable in upper and lower respiratory specimens during the acute  phase of infection. The  lowest concentration of SARS-CoV-2 viral copies this assay can detect is 250 copies / mL. A negative result does not preclude SARS-CoV-2 infection and should not be used as the sole basis for treatment or other patient management decisions.  A negative result may occur with improper specimen collection / handling, submission of specimen other than nasopharyngeal swab, presence of viral mutation(s) within the areas targeted by this assay, and inadequate number of viral copies (<250 copies / mL). A negative result must be combined with clinical observations, patient history, and epidemiological information.  Fact Sheet for Patients:   StrictlyIdeas.no  Fact Sheet for Healthcare Providers: BankingDealers.co.za  This test is not yet approved or  cleared by the [place] FDA and has been authorized for detection and/or diagnosis of SARS-CoV-2 by FDA under an Emergency Use Authorization (EUA).  This EUA will remain in effect (meaning this test can be used) for the duration of the COVID-19 declaration under Section 564(b)(1) of the Act, 21 U.S.C. section 360bbb-3(b)(1), unless the authorization is terminated or revoked sooner.  Performed at [facility], [address] [place], [place] [address]      Time coordinating discharge: 35 minutes  SIGNED:   Cordelia Poche, MD [facility] 11/06/2019, 12:03 PM

## 2019-11-07 ENCOUNTER — Encounter: Payer: Self-pay | Admitting: Gastroenterology

## 2019-11-07 ENCOUNTER — Telehealth: Payer: Self-pay

## 2019-11-07 LAB — ANA W/REFLEX IF POSITIVE: Anti Nuclear Antibody (ANA): NEGATIVE

## 2019-11-07 NOTE — Telephone Encounter (Signed)
Attempted to reach patient for TCM call and schedule hospital follow up appt. No answer and unable to leave message. Pt may reach me directly at [phone]

## 2019-11-08 ENCOUNTER — Telehealth: Payer: Self-pay

## 2019-11-08 LAB — SURGICAL PATHOLOGY

## 2019-11-08 NOTE — Telephone Encounter (Signed)
Copied from [place] [id number]. Topic: Referral - Status >> Nov 08, 2019  3:14 PM Simone Curia D wrote: [birth year] Spoke with patient she is waiting to be placed at [facility] through the [employer], she is currently 3rd on the list.  Patient stated she was not interested in any other resources at this time. Closing referral.  Ambrose Mantle [phone]

## 2019-11-09 ENCOUNTER — Other Ambulatory Visit: Admission: RE | Admit: 2019-11-09 | Payer: Medicare Other | Source: Ambulatory Visit

## 2019-11-09 ENCOUNTER — Telehealth: Payer: Self-pay

## 2019-11-10 NOTE — Telephone Encounter (Signed)
Transition Care Management Follow-up Telephone Call  Date of discharge and from where: 11/06/19 [facility]  How have you been since you were released from the hospital? Pt states she is doing okay but c/o body aching all over  Any questions or concerns? No   Items Reviewed:  Did the pt receive and understand the discharge instructions provided? Yes   Medications obtained and verified? Yes   Any new allergies since your discharge? No   Dietary orders reviewed? Yes  Do you have support at home? Yes   Functional Questionnaire: (I = Independent and D = Dependent) ADLs: I  Bathing/Dressing- I  Meal Prep- I  Eating- I  Maintaining continence- I  Transferring/Ambulation- I  Managing Meds- I  Follow up appointments reviewed:   PCP Hospital f/u appt confirmed? Yes  Scheduled to see Dr. Roxan Hockey on 11/14/19  @ 3:00.  [facility] f/u appt confirmed? Yes  Scheduled to see Dr. Jefm Bryant on 11/17/19.  Are transportation arrangements needed? No   If their condition worsens, is the pt aware to call PCP or go to the Emergency Dept.? Yes  Was the patient provided with contact information for the PCP's office or ED? Yes  Was to pt encouraged to call back with questions or concerns? Yes

## 2019-11-10 NOTE — Telephone Encounter (Signed)
Patient was contacted last week but forgot to document that she had mentioned that she was told by her PCP to go to the hospital and she did. Patient since then she had an EGD done and a repeat CRP. They both came out normal. At this time there is nothing to follow up from.

## 2019-11-11 ENCOUNTER — Ambulatory Visit: Admit: 2019-11-11 | Payer: Medicare Other | Admitting: Gastroenterology

## 2019-11-11 SURGERY — COLONOSCOPY WITH PROPOFOL
Anesthesia: General

## 2019-11-13 NOTE — Progress Notes (Signed)
Patient ID: [name], female    DOB: [birth year], 64 y.o.   MRN: [id number]  PCP: Linda Malkin, MD  Chief Complaint  Patient presents with  . Hospitalization Follow-up    went for this continous sharp burning stabbing that goes from under her left breast around into her back    Subjective:   Linda Mejia is a 64 y.o. female, presents to clinic with CC of the following:  Chief Complaint  Patient presents with  . Hospitalization Follow-up    went for this continous sharp burning stabbing that goes from under her left breast around into her back    HPI:  Patient is a 64 year old female My last visit with the patient was 10/25/2019 with that note reviewed.  Complaint at that visit was leg pains.  She was referred to rheumatology on that visit, and was seen on 11/02/2019, the day before she presented to the emergency room A repeat ANA done approximately 8 days ago was negative. The following assessment/plan from that rheumatology visit is noted: Assessment: Atypical symptoms with lower extremity and upper extremity pain symptoms. A little atypical for PMR Does have decreased range of motion of both shoulders and some limitation of her lumbar spine. Rule out spondylolisthesis. Rule out osteoarthritis of the shoulders versus capsulitis Anxiety Venous stasis  Plan: Sed rate CRP X-ray lumbar spine and both shoulders Prednisone taper See back. May need antidepressant or treatment for myalgia fibromyalgia syndrome  Follows up today for post hospitalization follow-up The discharge summary was as follows:  Admit date: 11/03/2019 Discharge date: 11/06/2019  Admitted From: Home Disposition: Home  Recommendations for Outpatient Follow-up:  1. Follow up with PCP in 1 week 2. Please follow up on the following pending results: ANA, EGD biopsy  Home Health: Outpatient PT Equipment/Devices: None  Discharge Condition: Stable CODE STATUS: Full code Diet recommendation:  Heart healthy   Brief/Interim Summary:  Admission HPI written by Linda Masse, MD  Chief Complaint:Nausea, vomiting, malaise  Linda Mejia a 64 y.o.femalewith medical history significant forCAD, anxiety, HTN, PUD, who presents to the emergency room with a 1 week history of malaise, body aches, subjective fever, nausea with a few episodes of emesis, one with coffee-ground, since resolved, with 1 day history of left-sided chest pain, located under the left breast, described as sharpand stabbing, nonradiating of moderate to severe intensity with no aggravating or alleviating factors and no associated shortness of breath or diaphoresis. She has no cough. Patient saw her PCP in the last few days and was found to have an elevated ANAand was started empirically on prednisone. She also was seen by her gastroenterologist on 10/27/2019 with a complaint of abdominal pain and the plan was for repeat EGD and screening. Her last EGD was April 2018 for hematemesis when she had 3 nonbleeding gastric antral ulcers. ED Course:On arrival in the emergency room she was tachycardic and had a low-grade temperature of 99.1. Patient was noted to desat to 86% on room air while in the ER requiring O2 at 2 L to get his sats up to the mid 90s.Marland Kitchen Her blood work was significant for creatinine of 1.08, up from 0.73 and she had a lactic acid of 2. Lipase normal at 35. WBC 8700, hemoglobin normal at 13.3. Troponin IV. EKG with no acute ST-T wave changes. CTA chest negative for PE. CT abdomen and pelvis with contrast with no findings to explain symptoms. Hospitalist consulted for admission.   Hospital course:  Nausea/vomiting Unknown etiology but possible secondary to known history of gastritis/gastric ulcer disease. Patient does state she previously had diarrhea but that has now resolved. She has associated malaise. Nausea improved. No vomiting currently but she has not taken anything by mouth.EGD  mostly unremarkable; biopsy obtained and pending.   Generalized malaise No definitive source. Patient reports on-off issues over the last year with not feeling well. She has seen her PCP for this however she keeps getting different providers. ESR/CRP normal. ANA pending.  Atypical chest pain Appears related to GI system. ACS workup negative. Per patient, similar to gastritis pain.  Acute respiratory failure with hypoxia Unknown etiology. Placed on oxygen secondary to an SpO2 of 86%. Oxygen saturation looks adequate today. CTA chest negative for acute process, including no PE.  CAD Continue aspirin  AKI Mild with a peak creatinine of 1.08. Baseline creatinine of 0.62. Resolved with IV fluids.  Lactic acidosis Mild with a peak of 2 and resolved with IV fluids.  Essential hypertension Continue metoprolol  Chest wall pain Unsure of etiology. Possibly musculoskeletal. No evidence of rash to suggest shingles   Discharge Diagnoses:  Principal Problem:   Nausea and vomiting Active Problems:   Coronary artery disease involving native coronary artery of native heart with angina pectoris (HCC)   Hypertension goal BP (blood pressure) < 140/90   History of gastric ulcer   Subclinical hypothyroidism   Chest pain   Acute respiratory failure with hypoxia (HCC)   Lactic acidosis   AKI (acute kidney injury) ([place])   Malaise   Hypoxia   Abdominal distension    Discharge Instructions      Discharge Instructions    Ambulatory referral to Physical Therapy   Complete by: As directed           Allergies as of 11/06/2019      Reactions   Amlodipine    Leg swelling   Statins Other (See Comments)         Medication List    TAKE these medications   acyclovir 400 MG tablet Commonly known as: ZOVIRAX Take 1 tablet (400 mg total) by mouth 2 (two) times daily.   amphetamine-dextroamphetamine 30 MG tablet Commonly known as: ADDERALL Take 30 mg by mouth 2  (two) times daily.   aspirin EC 81 MG tablet Take 81 mg by mouth daily.   cyanocobalamin 1000 MCG tablet Take 1,000 mcg by mouth daily.   diazepam 10 MG tablet Commonly known as: VALIUM Take 1 tablet (10 mg total) by mouth every 8 (eight) hours as needed for anxiety.   fluticasone 50 MCG/ACT nasal spray Commonly known as: FLONASE Place 2 sprays into both nostrils daily as needed for allergies.   metoprolol tartrate 50 MG tablet Commonly known as: LOPRESSOR TAKE 1 TABLET BY MOUTH TWICE DAILY   nitroGLYCERIN 0.4 MG SL tablet Commonly known as: NITROSTAT Place 1 tablet (0.4 mg total) under the tongue every 5 (five) minutes as needed for chest pain.   nystatin powder Commonly known as: MYCOSTATIN/NYSTOP Apply topically 2 (two) times daily. What changed:   when to take this  reasons to take this   pantoprazole 40 MG tablet Commonly known as: PROTONIX Take 1 tablet (40 mg total) by mouth daily.   Praluent 75 MG/ML Soaj Generic drug: Alirocumab Inject 1 each into the skin every 14 (fourteen) days.   predniSONE 5 MG tablet Commonly known as: DELTASONE Take 5-30 mg by mouth daily.  Follow-up Information        Linda Malkin, MD. Schedule an appointment as soon as possible for a visit in 1 week(s).   Specialty: Internal Medicine Why: Hospital follow-up Contact information: [address] [place] [address] [phone]                    Allergies  Allergen Reactions  . Amlodipine     Leg swelling  . Statins Other (See Comments)    Consultations:  Gastroenterology   Procedures/Studies: Imaging Results   DG Chest 2 View  Result Date: 11/03/2019 CLINICAL DATA:  Chest pain, vomiting, coffee-ground emesis EXAM: CHEST - 2 VIEW COMPARISON:  10/24/2019 FINDINGS: Minimal lingular atelectasis or scarring. The lungs are otherwise clear. No pneumothorax or pleural effusion. Cardiac size within  normal limits. Pulmonary vascularity normal. No acute bone abnormality. IMPRESSION: No active cardiopulmonary disease. Electronically Signed   By: Fidela Salisbury MD   On: 11/03/2019 18:05   DG Chest 2 View  Result Date: 10/24/2019 CLINICAL DATA:  Shortness of breath over the last 2 weeks. EXAM: CHEST - 2 VIEW COMPARISON:  08/26/2019 FINDINGS: Heart size is normal. Coronary artery calcification and stent. Aortic atherosclerotic calcification. Pulmonary vascularity is normal. The lungs are clear. No acute bone finding. Ordinary thoracic degenerative changes. Probable loose body left shoulder. IMPRESSION: No active disease. Aortic atherosclerosis. Coronary artery calcification and stent. Electronically Signed   By: Nelson Chimes M.D.   On: 10/24/2019 12:57   CT Angio Chest PE W/Cm &/Or Wo Cm  Result Date: 11/03/2019 CLINICAL DATA:  Epigastric and chest pain with 2 days of emesis, cough a ground emesis, wheezing EXAM: CT ANGIOGRAPHY CHEST CT ABDOMEN AND PELVIS WITH CONTRAST TECHNIQUE: Multidetector CT imaging of the chest was performed using the standard protocol during bolus administration of intravenous contrast. Multiplanar CT image reconstructions and MIPs were obtained to evaluate the vascular anatomy. Multidetector CT imaging of the abdomen and pelvis was performed using the standard protocol during bolus administration of intravenous contrast. CONTRAST:  64m OMNIPAQUE IOHEXOL 350 MG/ML SOLN COMPARISON:  Chest CT 01/28/2019, chest radiograph 11/03/2019, CT chest, abdomen and pelvis 05/28/2018 FINDINGS: CTA CHEST FINDINGS Cardiovascular: Satisfactory opacification the pulmonary arteries to the segmental level. No pulmonary artery filling defects are identified. Central pulmonary arteries are normal caliber. Normal heart size. No pericardial effusion. Three-vessel coronary artery calcifications are present. Atherosclerotic plaque within the normal caliber aorta. No acute luminal abnormality. No  periaortic stranding or hemorrhage. Normal 3 vessel branching of the aortic arch with calcified plaque within the proximal great vessels. No major venous abnormalities. Mediastinum/Nodes: Few mildly prominent mediastinal and hilar nodes including a larger 10 mm precarinal node (2/29), and 10 mm right hilar node (2/36) are not significantly changed from prior. No new enlarged or enlarging worrisome mediastinal, hilar or axillary lymph nodes. No mediastinal fluid or gas. Normal thyroid gland and thoracic inlet. Lungs/Pleura: Respiratory motion degraded images. No consolidation, features of edema, pneumothorax, or effusion. Dependent atelectatic changes posteriorly. Some mosaic areas of attenuation in the lungs may reflect areas of air trapping or small airways disease. Musculoskeletal: No acute or worrisome osseous abnormalities in the spine. Stable mild anterior wedging at T12 unchanged from prior with superimposed Schmorl's node formations. Mild multilevel degenerative changes with slightly exaggerated thoracic kyphosis. Additional degenerative changes in the shoulders including a mineralized joint body in the medial recess of the left shoulder. No worrisome chest wall lesions. Review of the MIP images confirms the above findings.  CT ABDOMEN and PELVIS FINDINGS Hepatobiliary: No worrisome focal liver lesions. Smooth liver surface contour. Normal hepatic attenuation. Gallbladder largely decompressed at the time of exam imaging with wall thickening appropriate for this degree of distention. No visible calcified gallstones or biliary ductal dilatation. No pericholecystic fluid or inflammation. Pancreas: Unremarkable. No pancreatic ductal dilatation or surrounding inflammatory changes. Spleen: Normal in size. No concerning splenic lesions. Adrenals/Urinary Tract: Normal adrenal glands. Kidneys enhance and excrete symmetrically. No concerning renal lesions. No urolithiasis or hydronephrosis. Urinary bladder is  unremarkable. Stomach/Bowel: Distal esophagus, stomach and duodenal sweep are unremarkable. No small bowel wall thickening or dilatation. No evidence of obstruction. Small appendiceal stump/remnant in the right lower quadrant (7/33-36) with chart indicating prior appendectomy. Slight mobility of the cecum without evidence of volvulus. No colonic dilatation or wall thickening. Scattered colonic diverticula without focal inflammation to suggest diverticulitis. Vascular/Lymphatic: Atherosclerotic calcifications within the abdominal aorta and branch vessels. Multilobular fusiform ectasia of the infrarenal abdominal aorta with maximal diameter measuring up to 3.2 cm in size. No other aneurysm or ectasia. No enlarged abdominopelvic lymph nodes. Reproductive: Slightly retroverted uterus. No concerning adnexal lesions. Other: No abdominopelvic free fluid or free gas. No bowel containing hernias. Small fat containing umbilical hernia. Musculoskeletal: No acute osseous abnormality or suspicious osseous lesion. Multilevel degenerative changes are present in the imaged portions of the spine. Mild degenerative changes in the hips and pelvis. Review of the MIP images confirms the above findings. IMPRESSION: 1. No evidence of pulmonary embolism. 2. Mosaic areas of attenuation in the lungs may reflect areas of air trapping or small airways disease. 3. No other acute findings in the chest, abdomen or pelvis to explain the patient's symptoms. 4. Multilobular fusiform ectasia of the infrarenal abdominal aorta with maximal diameter measuring up to 3.2 cm in size. Recommend followup by ultrasound in 3 years. This recommendation follows ACR consensus guidelines: White Paper of the ACR Incidental Findings Committee II on Vascular Findings. J Am Coll Radiol 2013; 10:789-794. Aortic aneurysm NOS (ICD10-I71.9) 5. Three-vessel coronary artery calcifications are present. Please note that the presence of coronary artery calcium documents the  presence of coronary artery disease, the severity of this disease and any potential stenosis cannot be assessed on this non-gated CT examination. Assessment for potential risk factor modification, dietary therapy or pharmacologic therapy may be warranted. 6. Aortic Atherosclerosis (ICD10-I70.0). Electronically Signed   By: Lovena Le M.D.   On: 11/03/2019 19:11   CT Abdomen Pelvis W Contrast  Result Date: 11/03/2019 CLINICAL DATA:  Epigastric and chest pain with 2 days of emesis, cough a ground emesis, wheezing EXAM: CT ANGIOGRAPHY CHEST CT ABDOMEN AND PELVIS WITH CONTRAST TECHNIQUE: Multidetector CT imaging of the chest was performed using the standard protocol during bolus administration of intravenous contrast. Multiplanar CT image reconstructions and MIPs were obtained to evaluate the vascular anatomy. Multidetector CT imaging of the abdomen and pelvis was performed using the standard protocol during bolus administration of intravenous contrast. CONTRAST:  26m OMNIPAQUE IOHEXOL 350 MG/ML SOLN COMPARISON:  Chest CT 01/28/2019, chest radiograph 11/03/2019, CT chest, abdomen and pelvis 05/28/2018 FINDINGS: CTA CHEST FINDINGS Cardiovascular: Satisfactory opacification the pulmonary arteries to the segmental level. No pulmonary artery filling defects are identified. Central pulmonary arteries are normal caliber. Normal heart size. No pericardial effusion. Three-vessel coronary artery calcifications are present. Atherosclerotic plaque within the normal caliber aorta. No acute luminal abnormality. No periaortic stranding or hemorrhage. Normal 3 vessel branching of the aortic arch with calcified plaque within the proximal great vessels.  No major venous abnormalities. Mediastinum/Nodes: Few mildly prominent mediastinal and hilar nodes including a larger 10 mm precarinal node (2/29), and 10 mm right hilar node (2/36) are not significantly changed from prior. No new enlarged or enlarging worrisome mediastinal,  hilar or axillary lymph nodes. No mediastinal fluid or gas. Normal thyroid gland and thoracic inlet. Lungs/Pleura: Respiratory motion degraded images. No consolidation, features of edema, pneumothorax, or effusion. Dependent atelectatic changes posteriorly. Some mosaic areas of attenuation in the lungs may reflect areas of air trapping or small airways disease. Musculoskeletal: No acute or worrisome osseous abnormalities in the spine. Stable mild anterior wedging at T12 unchanged from prior with superimposed Schmorl's node formations. Mild multilevel degenerative changes with slightly exaggerated thoracic kyphosis. Additional degenerative changes in the shoulders including a mineralized joint body in the medial recess of the left shoulder. No worrisome chest wall lesions. Review of the MIP images confirms the above findings. CT ABDOMEN and PELVIS FINDINGS Hepatobiliary: No worrisome focal liver lesions. Smooth liver surface contour. Normal hepatic attenuation. Gallbladder largely decompressed at the time of exam imaging with wall thickening appropriate for this degree of distention. No visible calcified gallstones or biliary ductal dilatation. No pericholecystic fluid or inflammation. Pancreas: Unremarkable. No pancreatic ductal dilatation or surrounding inflammatory changes. Spleen: Normal in size. No concerning splenic lesions. Adrenals/Urinary Tract: Normal adrenal glands. Kidneys enhance and excrete symmetrically. No concerning renal lesions. No urolithiasis or hydronephrosis. Urinary bladder is unremarkable. Stomach/Bowel: Distal esophagus, stomach and duodenal sweep are unremarkable. No small bowel wall thickening or dilatation. No evidence of obstruction. Small appendiceal stump/remnant in the right lower quadrant (7/33-36) with chart indicating prior appendectomy. Slight mobility of the cecum without evidence of volvulus. No colonic dilatation or wall thickening. Scattered colonic diverticula without focal  inflammation to suggest diverticulitis. Vascular/Lymphatic: Atherosclerotic calcifications within the abdominal aorta and branch vessels. Multilobular fusiform ectasia of the infrarenal abdominal aorta with maximal diameter measuring up to 3.2 cm in size. No other aneurysm or ectasia. No enlarged abdominopelvic lymph nodes. Reproductive: Slightly retroverted uterus. No concerning adnexal lesions. Other: No abdominopelvic free fluid or free gas. No bowel containing hernias. Small fat containing umbilical hernia. Musculoskeletal: No acute osseous abnormality or suspicious osseous lesion. Multilevel degenerative changes are present in the imaged portions of the spine. Mild degenerative changes in the hips and pelvis. Review of the MIP images confirms the above findings. IMPRESSION: 1. No evidence of pulmonary embolism. 2. Mosaic areas of attenuation in the lungs may reflect areas of air trapping or small airways disease. 3. No other acute findings in the chest, abdomen or pelvis to explain the patient's symptoms. 4. Multilobular fusiform ectasia of the infrarenal abdominal aorta with maximal diameter measuring up to 3.2 cm in size. Recommend followup by ultrasound in 3 years. This recommendation follows ACR consensus guidelines: White Paper of the ACR Incidental Findings Committee II on Vascular Findings. J Am Coll Radiol 2013; 10:789-794. Aortic aneurysm NOS (ICD10-I71.9) 5. Three-vessel coronary artery calcifications are present. Please note that the presence of coronary artery calcium documents the presence of coronary artery disease, the severity of this disease and any potential stenosis cannot be assessed on this non-gated CT examination. Assessment for potential risk factor modification, dietary therapy or pharmacologic therapy may be warranted. 6. Aortic Atherosclerosis (ICD10-I70.0). Electronically Signed   By: Lovena Le M.D.   On: 11/03/2019 19:11   US Venous Img Lower Bilateral  Result Date:  10/24/2019 CLINICAL DATA:  Bilateral lower extremity pain. EXAM: BILATERAL LOWER EXTREMITY VENOUS DOPPLER ULTRASOUND  TECHNIQUE: Gray-scale sonography with graded compression, as well as color Doppler and duplex ultrasound were performed to evaluate the lower extremity deep venous systems from the level of the common femoral vein and including the common femoral, femoral, profunda femoral, popliteal and calf veins including the posterior tibial, peroneal and gastrocnemius veins when visible. The superficial great saphenous vein was also interrogated. Spectral Doppler was utilized to evaluate flow at rest and with distal augmentation maneuvers in the common femoral, femoral and popliteal veins. COMPARISON:  None. FINDINGS: RIGHT LOWER EXTREMITY Common Femoral Vein: No evidence of thrombus. Normal compressibility, respiratory phasicity and response to augmentation. Saphenofemoral Junction: No evidence of thrombus. Normal compressibility and flow on color Doppler imaging. Profunda Femoral Vein: No evidence of thrombus. Normal compressibility and flow on color Doppler imaging. Femoral Vein: No evidence of thrombus. Normal compressibility, respiratory phasicity and response to augmentation. Popliteal Vein: No evidence of thrombus. Normal compressibility, respiratory phasicity and response to augmentation. Calf Veins: No evidence of thrombus. Normal compressibility and flow on color Doppler imaging. Superficial Great Saphenous Vein: No evidence of thrombus. Normal compressibility. Venous Reflux:  None. Other Findings: No evidence of superficial thrombophlebitis or abnormal fluid collection. LEFT LOWER EXTREMITY Common Femoral Vein: No evidence of thrombus. Normal compressibility, respiratory phasicity and response to augmentation. Saphenofemoral Junction: No evidence of thrombus. Normal compressibility and flow on color Doppler imaging. Profunda Femoral Vein: No evidence of thrombus. Normal compressibility and flow on  color Doppler imaging. Femoral Vein: No evidence of thrombus. Normal compressibility, respiratory phasicity and response to augmentation. Popliteal Vein: No evidence of thrombus. Normal compressibility, respiratory phasicity and response to augmentation. Calf Veins: No evidence of thrombus. Normal compressibility and flow on color Doppler imaging. Superficial Great Saphenous Vein: No evidence of thrombus. Normal compressibility. Venous Reflux:  None. Other Findings: No evidence of superficial thrombophlebitis or abnormal fluid collection. IMPRESSION: No evidence of deep venous thrombosis in either lower extremity. Electronically Signed   By: Aletta Edouard M.D.   On: 10/24/2019 13:40   DG Abd Portable 1V  Result Date: 11/04/2019 CLINICAL DATA:  Abdominal distension and vomiting. EXAM: PORTABLE ABDOMEN - 1 VIEW COMPARISON:  07/20/2014 and abdomen and pelvis CT obtained yesterday. FINDINGS: Normal bowel gas pattern. Atheromatous arterial calcifications. Lower thoracic spine degenerative changes. IMPRESSION: No acute abnormality. Electronically Signed   By: Claudie Revering M.D.   On: 11/04/2019 12:20       UPPER GI ENDOSCOPY Impression: - Normal duodenal bulb and second portion of the  duodenum. Biopsied. - Erythematous mucosa in the prepyloric region of the  stomach. - Normal gastric body, incisura and antrum. Biopsied. - Esophagogastric landmarks identified. - Normal gastroesophageal junction and esophagus. Recommendation: - Await pathology results. - Return patient to hospital ward for ongoing care. - Use a proton pump inhibitor PO BID. - Return to GI clinic as previously scheduled.   Since discharge, she notes she has been continually frustrated as have not found an  obvious source to her symptoms.  She described the chest pain she was having is under the left breast, and radiating around to the side, often sharp, with no rash that has developed over time.  It is intermittently worsened at times.  No associated shortness of breath or marked diaphoresis. She also has the body aches, and some malaise feelings at times which continue.  She just has not felt well recently. She has an appointment again with Dr. Jefm Bryant from rheumatology tomorrow morning. I noted there is a lot of good news in the  results of a lot of the testing done here recently not showing [place] an obvious cause and she was understanding of that.  She has not been taking the prednisone since the hospitalization, and states she did not like the way she felt on that and does not want to continue to take that.  That was started by rheumatology.  Lab Results  Component Value Date   WBC 9.1 11/04/2019   HGB 12.3 11/04/2019   HCT 39.7 11/04/2019   MCV 103.1 (H) 11/04/2019   PLT 301 10/08/9483   Last metabolic panel Lab Results  Component Value Date   GLUCOSE 141 (H) 11/04/2019   NA 142 11/04/2019   K 4.2 11/04/2019   CL 104 11/04/2019   CO2 27 11/04/2019   BUN 17 11/04/2019   CREATININE 0.76 11/04/2019   GFRNONAA >60 11/04/2019   GFRAA >60 11/04/2019   CALCIUM 8.6 (L) 11/04/2019   PHOS 2.8 05/17/2018   PROT 7.4 11/03/2019   ALBUMIN 4.0 11/03/2019   BILITOT 0.5 11/03/2019   ALKPHOS 63 11/03/2019   AST 28 11/03/2019   ALT 18 11/03/2019   ANIONGAP 11 11/04/2019    Patient Active Problem List   Diagnosis Date Noted  . Hypoxia 11/04/2019  . Abdominal distension   . Acute respiratory failure with hypoxia ([place]) 11/03/2019  . Lactic acidosis 11/03/2019  . AKI (acute kidney injury) ([place]) 11/03/2019  . Malaise 11/03/2019  . Cellulitis 01/28/2019  . 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 ANA positive 09/22/2018  . 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 Tenosynovitis of hand 09/22/2018  . Breast mass, right 06/14/2018  . Acute peptic ulcer of stomach   . Stomach irritation   . Abdominal pain, epigastric   . Acute gastric ulcer due to Helicobacter pylori   . Pulmonary hypertension ([place]) 03/09/2018  . Unstable angina ([place]) 01/23/2018  . Medication monitoring encounter 03/18/2017  . Colon cancer screening 03/18/2017  . Coronary artery calcification seen on CT scan 12/02/2016  . Aortic atherosclerosis ([place]) 12/02/2016  . Abnormal ankle brachial index (ABI) 12/02/2016  . Prediabetes 12/02/2016  . Fracture of rib 08/28/2016  . Chest pain 08/11/2016  . Hx of tobacco use, presenting hazards to health 06/09/2016  . Degenerative arthritis of knee, bilateral 12/27/2015  . GI bleed 09/21/2015  . Reflux esophagitis   . Nausea and vomiting   . Polyarthralgia 06/05/2015  . Night sweats 06/05/2015  . Headache 04/12/2015  . Subclinical hypothyroidism 03/26/2015  . Herpes simplex type 2 infection 03/21/2015  . Rhinitis, allergic 03/21/2015  . ADHD (attention deficit hyperactivity disorder), inattentive type 09/20/2014  . Episodic paroxysmal anxiety disorder 09/20/2014  . History of gastric ulcer 09/20/2014  . Substance abuse ([place]) 09/20/2014  . HLD (hyperlipidemia) 10/03/2009  . Coronary artery disease involving native coronary artery of native heart with angina pectoris ([place]) 10/02/2009  . Hypertension goal BP (blood pressure) < 140/90 10/02/2009      Current Outpatient Medications:  .  acyclovir (ZOVIRAX) 400 MG tablet, Take 1 tablet (400 mg total) by mouth 2 (two) times daily., Disp: 60 tablet, Rfl: 5 .  Alirocumab (PRALUENT) 75 MG/ML SOAJ, Inject 1 each into the skin every 14 (fourteen) days., Disp: 6 pen, Rfl: 3 .  amphetamine-dextroamphetamine (ADDERALL) 30 MG tablet, Take 30 mg by mouth 2 (two) times daily. , Disp: , Rfl:  .  aspirin EC 81 MG tablet, Take 81 mg by mouth daily., Disp: , Rfl:  .  cyanocobalamin 1000 MCG tablet, Take 1,000 mcg by mouth daily., Disp: , Rfl:  .  diazepam (VALIUM) 10 MG tablet, Take 1 tablet (10 mg total) by mouth every 8 (eight) hours as needed for anxiety., Disp: 30 tablet, Rfl: 0 .  fluticasone (FLONASE) 50 MCG/ACT nasal spray, Place 2 sprays into both nostrils daily as needed for allergies., Disp: 16 g, Rfl: 11 .  metoprolol tartrate (LOPRESSOR) 50 MG tablet, TAKE 1 TABLET BY MOUTH TWICE DAILY (Patient taking differently: Take 50 mg by mouth 2 (two) times daily. ), Disp: 60 tablet, Rfl: 5 .  nystatin (MYCOSTATIN/NYSTOP) powder, Apply topically 2 (two) times daily. (Patient taking differently: Apply topically as needed (rash). ), Disp: 15 g, Rfl: 0 .  pantoprazole (PROTONIX) 40 MG tablet, Take 1 tablet (40 mg total) by mouth daily., Disp: 90 tablet, Rfl: 0 .  nitroGLYCERIN (NITROSTAT) 0.4 MG SL tablet, Place 1 tablet (0.4 mg total) under the tongue every 5 (five) minutes as needed for chest pain. (Patient not taking: Reported on 11/14/2019), Disp: 30 tablet, Rfl: 0   Allergies  Allergen Reactions  . Amlodipine     Leg swelling  . Statins Other (See Comments)     Past Surgical History:  Procedure Laterality Date  . APPENDECTOMY    . CARDIAC CATHETERIZATION    . CESAREAN SECTION     x2  . COLON SURGERY     interception  . CORONARY ANGIOPLASTY     stents...  2000  . ESOPHAGOGASTRODUODENOSCOPY (EGD) WITH PROPOFOL N/A 09/21/2015   Procedure: ESOPHAGOGASTRODUODENOSCOPY (EGD) WITH PROPOFOL;  Surgeon: Lucilla Lame, MD;  Location: [facility] ENDOSCOPY;  Service: Endoscopy;  Laterality: N/A;  . ESOPHAGOGASTRODUODENOSCOPY (EGD) WITH PROPOFOL N/A 07/30/2016   Procedure: ESOPHAGOGASTRODUODENOSCOPY (EGD) WITH PROPOFOL;  Surgeon: Jonathon Bellows, MD;  Location: [facility] ENDOSCOPY;  Service: Endoscopy;  Laterality: N/A;  . ESOPHAGOGASTRODUODENOSCOPY (EGD) WITH PROPOFOL N/A 04/23/2018   Procedure: ESOPHAGOGASTRODUODENOSCOPY (EGD) WITH PROPOFOL;  Surgeon: Virgel Manifold, MD;   Location: [facility] ENDOSCOPY;  Service: Endoscopy;  Laterality: N/A;  . ESOPHAGOGASTRODUODENOSCOPY (EGD) WITH PROPOFOL N/A 11/04/2019   Procedure: ESOPHAGOGASTRODUODENOSCOPY (EGD) WITH PROPOFOL;  Surgeon: Lin Landsman, MD;  Location: [facility] ENDOSCOPY;  Service: Gastroenterology;  Laterality: N/A;  . LEFT HEART CATH Right 01/25/2018   Procedure: Left Heart Cath and Coronary Angiography;  Surgeon: Dionisio David, MD;  Location: [place] CV LAB;  Service: Cardiovascular;  Laterality: Right;  . LEFT HEART CATH AND CORONARY ANGIOGRAPHY Right 08/12/2016   Procedure: Left Heart Cath and Coronary Angiography;  Surgeon: Dionisio David, MD;  Location: [place] CV LAB;  Service: Cardiovascular;  Laterality: Right;  . TONSILLECTOMY    . TUBAL LIGATION       Family History  Problem Relation Age of Onset  . Diabetes Mother   . CVA Mother   . Heart disease Father   . Heart disease Sister   . Breast cancer Sister   . Heart disease Brother   . Heart disease Sister   . Heart disease Sister   . Heart disease Sister   . Heart disease Brother   . Heart disease Brother   . Heart disease Brother   . Heart disease Brother   . Healthy Daughter   . Healthy Daughter      Social History   Tobacco Use  . Smoking status: Former Smoker    Packs/day: 1.00    Years: 30.00    Pack years: 30.00    Types: Cigarettes    Quit date: 2006  Years since quitting: 15.5  . Smokeless tobacco: Never Used  Substance Use Topics  . Alcohol use: Yes    With staff assistance, above reviewed with the patient today.  ROS: As per HPI, otherwise no specific complaints on a limited and focused system review   No results found for this or any previous visit (from the past 72 hour(s)).   PHQ2/9: Depression screen [facility] 2/9 11/14/2019 10/25/2019 09/23/2019 05/27/2019 02/10/2019  Decreased Interest 0 0 0 0 0  Down, Depressed, Hopeless 1 0 1 0 0  PHQ - 2 Score 1 0 1 0 0  Altered sleeping 1 0 - - 0  Tired,  decreased energy 1 0 - - 0  Change in appetite 0 0 - - 0  Feeling bad or failure about yourself  0 0 - - 0  Trouble concentrating 0 0 - - 0  Moving slowly or fidgety/restless 0 0 - - 0  Suicidal thoughts 0 0 - - 0  PHQ-9 Score 3 0 - - 0  Difficult doing work/chores Not difficult at all Not difficult at all - - Not difficult at all  Some recent data might be hidden   PHQ-2/9 Result is reviewed  Fall Risk: Fall Risk  11/14/2019 10/25/2019 05/27/2019 02/10/2019 12/16/2018  Falls in the past year? 0 0 0 0 0  Number falls in past yr: 0 0 - 0 0  Injury with Fall? 0 0 - 0 0  Comment - - - - -  Risk for fall due to : - - Medication side effect;Other (Comment) - -  Risk for fall due to: Comment - - History of Joint Pain - -  Follow up - - Falls prevention discussed Falls evaluation completed Falls prevention discussed      Objective:   Vitals:   11/14/19 1458  BP: 140/80  Pulse: 82  Resp: 16  Temp: 97.9 F (36.6 C)  TempSrc: Temporal  SpO2: 97%  Weight: 217 lb 3.2 oz (98.5 kg)  Height: '5\' 5"'  (1.651 m)    Body mass index is 36.14 kg/m.  Physical Exam   NAD, masked HEENT - [place]/AT, sclera anicteric, PERRL, EOMI, conj - non-inj'ed, pharynx clear Neck - supple, no adenopathy,  Car - RRR without m/g/r Pulm- RR and effort normal at rest, CTA without wheeze or rales Abd - soft, obese,  minimally tender in the epigastric region, nontender otherwise, ND, BS+,  no obvious masses Back - no CVA tenderness,  Ext - no LE edema,  Neuro/psychiatric - affect was not flat, appropriate with conversation             Alert              Grossly non-focal              Speech normal   Results for orders placed or performed during the hospital encounter of 11/03/19  SARS Coronavirus 2 by RT PCR (hospital order, performed in [facility] hospital lab) Nasopharyngeal Nasopharyngeal Swab   Specimen: Nasopharyngeal Swab  Result Value Ref Range   SARS Coronavirus 2 NEGATIVE NEGATIVE  Lactic acid, plasma    Result Value Ref Range   Lactic Acid, Venous 2.0 (HH) 0.5 - 1.9 mmol/L  Lactic acid, plasma  Result Value Ref Range   Lactic Acid, Venous 0.9 0.5 - 1.9 mmol/L  CBC with Differential  Result Value Ref Range   WBC 8.7 4.0 - 10.5 K/uL   RBC 4.21 3.87 - 5.11 MIL/uL   Hemoglobin  13.3 12.0 - 15.0 g/dL   HCT 40.6 36 - 46 %   MCV 96.4 80.0 - 100.0 fL   MCH 31.6 26.0 - 34.0 pg   MCHC 32.8 30.0 - 36.0 g/dL   RDW 13.2 11.5 - 15.5 %   Platelets 343 150 - 400 K/uL   nRBC 0.0 0.0 - 0.2 %   Neutrophils Relative % 70 %   Neutro Abs 6.1 1.7 - 7.7 K/uL   Lymphocytes Relative 24 %   Lymphs Abs 2.1 0.7 - 4.0 K/uL   Monocytes Relative 6 %   Monocytes Absolute 0.5 0 - 1 K/uL   Eosinophils Relative 0 %   Eosinophils Absolute 0.0 0 - 0 K/uL   Basophils Relative 0 %   Basophils Absolute 0.0 0 - 0 K/uL   Immature Granulocytes 0 %   Abs Immature Granulocytes 0.03 0.00 - 0.07 K/uL  Basic metabolic panel  Result Value Ref Range   Sodium 139 135 - 145 mmol/L   Potassium 4.0 3.5 - 5.1 mmol/L   Chloride 103 98 - 111 mmol/L   CO2 25 22 - 32 mmol/L   Glucose, Bld 153 (H) 70 - 99 mg/dL   BUN 22 8 - 23 mg/dL   Creatinine, Ser 1.08 (H) 0.44 - 1.00 mg/dL   Calcium 8.7 (L) 8.9 - 10.3 mg/dL   GFR calc non Af Amer 55 (L) >60 mL/min   GFR calc Af Amer >60 >60 mL/min   Anion gap 11 5 - 15  TSH  Result Value Ref Range   TSH 0.591 0.350 - 4.500 uIU/mL  Lipase, blood  Result Value Ref Range   Lipase 35 11 - 51 U/L  Hepatic function panel  Result Value Ref Range   Total Protein 7.4 6.5 - 8.1 g/dL   Albumin 4.0 3.5 - 5.0 g/dL   AST 28 15 - 41 U/L   ALT 18 0 - 44 U/L   Alkaline Phosphatase 63 38 - 126 U/L   Total Bilirubin 0.5 0.3 - 1.2 mg/dL   Bilirubin, Direct <0.1 0.0 - 0.2 mg/dL   Indirect Bilirubin NOT CALCULATED 0.3 - 0.9 mg/dL  Basic metabolic panel  Result Value Ref Range   Sodium 142 135 - 145 mmol/L   Potassium 4.2 3.5 - 5.1 mmol/L   Chloride 104 98 - 111 mmol/L   CO2 27 22 - 32 mmol/L    Glucose, Bld 141 (H) 70 - 99 mg/dL   BUN 17 8 - 23 mg/dL   Creatinine, Ser 0.76 0.44 - 1.00 mg/dL   Calcium 8.6 (L) 8.9 - 10.3 mg/dL   GFR calc non Af Amer >60 >60 mL/min   GFR calc Af Amer >60 >60 mL/min   Anion gap 11 5 - 15  CBC  Result Value Ref Range   WBC 9.1 4.0 - 10.5 K/uL   RBC 3.85 (L) 3.87 - 5.11 MIL/uL   Hemoglobin 12.3 12.0 - 15.0 g/dL   HCT 39.7 36 - 46 %   MCV 103.1 (H) 80.0 - 100.0 fL   MCH 31.9 26.0 - 34.0 pg   MCHC 31.0 30.0 - 36.0 g/dL   RDW 13.2 11.5 - 15.5 %   Platelets 301 150 - 400 K/uL   nRBC 0.0 0.0 - 0.2 %  Glucose, capillary  Result Value Ref Range   Glucose-Capillary 106 (H) 70 - 99 mg/dL  ESR  Result Value Ref Range   Sed Rate 14 0 - 30 mm/hr  C-reactive protein  Result Value Ref Range   CRP 0.9 <1.0 mg/dL  ANA w/Reflex if Positive  Result Value Ref Range   Anti Nuclear Antibody (ANA) Negative Negative  Surgical pathology  Result Value Ref Range   SURGICAL PATHOLOGY      SURGICAL PATHOLOGY CASE: [id number] PATIENT: Sussan Thurston Surgical Pathology Report     Specimen Submitted: A. Duodenum; cbx B. Stomach, incisure; cbx C. Stomach, antrum, greater curvature; cbx D. Stomach, antrum, lesser curvature; cbx E. Stomach, body, greater curvature; cbx F. Stomach, body, lesser curvature; cbx  Clinical History: Epigastric pain, coffee-ground emesis.  Gastric erythema      DIAGNOSIS: A. DUODENUM; COLD BIOPSY: - DUODENAL MUCOSA WITH NO SIGNIFICANT PATHOLOGIC ALTERATION. - NEGATIVE FOR FEATURES OF CELIAC DISEASE. - NEGATIVE FOR DYSPLASIA AND MALIGNANCY.  B. STOMACH, INCISURE; COLD BIOPSY: - GASTRIC ANTRAL MUCOSA WITH NO SIGNIFICANT PATHOLOGIC ALTERATION. - NEGATIVE FOR ACTIVE INFLAMMATION AND H PYLORI. - NEGATIVE FOR INTESTINAL METAPLASIA, DYSPLASIA, AND MALIGNANCY.  C. STOMACH, ANTRUM, GREATER CURVATURE; COLD BIOPSY: - REACTIVE GASTROPATHY WITH AREAS SUGGESTIVE OF HEALING MUCOSAL INJURY. - FOCAL INTESTINAL METAPLASIA. - NEGATIVE   FOR ACTIVE INFLAMMATION AND H PYLORI. - NEGATIVE FOR DYSPLASIA AND MALIGNANCY.  D. STOMACH, ANTRUM, LESSER CURVATURE; COLD BIOPSY: - GASTRIC ANTRAL MUCOSA WITH NO SIGNIFICANT PATHOLOGIC ALTERATION. - NEGATIVE FOR ACTIVE INFLAMMATION AND H PYLORI. - NEGATIVE FOR INTESTINAL METAPLASIA, DYSPLASIA, AND MALIGNANCY.  E. STOMACH, BODY, GREATER CURVATURE; COLD BIOPSY: - GASTRIC OXYNTIC MUCOSA WITH NO SIGNIFICANT PATHOLOGIC ALTERATION. - NEGATIVE FOR ACTIVE INFLAMMATION AND H PYLORI. - NEGATIVE FOR INTESTINAL METAPLASIA, DYSPLASIA, AND MALIGNANCY.  F. STOMACH, BODY, LESSER CURVATURE; COLD BIOPSY: - GASTRIC OXYNTIC MUCOSA WITH NO SIGNIFICANT PATHOLOGIC ALTERATION. - NEGATIVE FOR ACTIVE INFLAMMATION AND H PYLORI. - NEGATIVE FOR INTESTINAL METAPLASIA, DYSPLASIA, AND MALIGNANCY.    GROSS DESCRIPTION: A. Labeled: cbx duodenum Received: Formalin Tissue fragment(s): Multiple Size: Aggregate, 0.9 x 0.5 x 0.3 cm Description: Tan soft tissue fragments Entirely submitted in 1 cas sette.  B. Labeled: cbx stomach incisure Received: Formalin Tissue fragment(s): Multiple Size: Aggregate, 0.7 x 0.4 x 0.3 cm Description: Tan soft tissue fragments Entirely submitted in 1 cassette.  C. Labeled: cbx antrum greater curvature Received: Formalin Tissue fragment(s): Multiple Size: Aggregate, 0.9 x 0.5 x 0.3 cm Description: Tan soft tissue fragments Entirely submitted in 1 cassette.  D. Labeled: cbx antrum lesser curvature Received: Formalin Tissue fragment(s): 2 Size: Ranging 0.3-0.4 cm Description: Tan soft tissue fragments Entirely submitted in 1 cassette.  E. Labeled: cbx body greater curvature Received: Formalin Tissue fragment(s): Multiple Size: Aggregate, 0.8 x 0.5 x 0.3 cm Description: Tan-white tissue fragments Entirely submitted in 1 cassette.  F. Labeled: cbx body lesser curvature Received: Formalin Tissue fragment(s): 2 Size: Range from 0.3-0.4 cm Description: Tan soft tissue  fragments Entirely submitted in 1 cassette.    Fi nal Diagnosis performed by Betsy Pries, MD.   Electronically signed 11/08/2019 1:20:11PM The electronic signature indicates that the named Attending Pathologist has evaluated the specimen Technical component performed at [facility], [address] [place], [place] [address] Lab: [phone] Dir: Rush Farmer, MD, MMM  Professional component performed at [facility], [facility], [place], [place], [place] [address] Lab: [phone] Dir: Dellia Nims. Rubinas, MD   Troponin I (High Sensitivity)  Result Value Ref Range   Troponin I (High Sensitivity) 4 <18 ng/L       Assessment & Plan:   1. Encounter for examination following treatment at hospital Reviewed the data from her hospitalization.  2. Malaise/body aches Exact source still  not entirely clear, although I am concerned for something more rheumatologic. She has a follow-up appointment tomorrow morning with Dr. Jefm Bryant from rheumatology, and await his input. Question if a medicine like Cymbalta or another entity for a fibromyalgia type of process may be indicated.  Await his input.  3. Hypertension goal BP (blood pressure) < 140/90 Blood pressure was reasonable on check today We will continue to monitor  4. Atypical chest pain The chest pain underneath the left breast almost sounds zoster-like by description, although she has no rash nor has had one in the recent past. An acute cardiac source was excluded with this recent hospitalization. She did have CT scans as well. Gastroenterology was involved in a scope and procedure done, with no H. pylori, no dysplasia or cancer concerns on biopsies done.  She remains on a PPI, and notes that does help with reflux symptoms.   5. Other depression Concerned she is getting a little more depressed without an obvious source to her symptoms, and considering an antidepressant in the near future is certainly indicated,  with one that may help with fibromyalgia type of symptoms possibly a good choice.  With rheumatology follow-up scheduled for tomorrow morning, will hold off on initiating something today, and await their input presently.         Linda Malkin, MD 11/14/19 3:07 PM

## 2019-11-14 ENCOUNTER — Ambulatory Visit (INDEPENDENT_AMBULATORY_CARE_PROVIDER_SITE_OTHER): Payer: Medicare Other | Admitting: Internal Medicine

## 2019-11-14 ENCOUNTER — Other Ambulatory Visit: Payer: Self-pay

## 2019-11-14 ENCOUNTER — Encounter: Payer: Self-pay | Admitting: Internal Medicine

## 2019-11-14 VITALS — BP 140/80 | HR 82 | Temp 97.9°F | Resp 16 | Ht 65.0 in | Wt 217.2 lb

## 2019-11-14 DIAGNOSIS — F419 Anxiety disorder, unspecified: Secondary | ICD-10-CM | POA: Insufficient documentation

## 2019-11-14 DIAGNOSIS — F329 Major depressive disorder, single episode, unspecified: Secondary | ICD-10-CM | POA: Insufficient documentation

## 2019-11-14 DIAGNOSIS — F32A Depression, unspecified: Secondary | ICD-10-CM | POA: Insufficient documentation

## 2019-11-14 DIAGNOSIS — I10 Essential (primary) hypertension: Secondary | ICD-10-CM | POA: Diagnosis not present

## 2019-11-14 DIAGNOSIS — Z09 Encounter for follow-up examination after completed treatment for conditions other than malignant neoplasm: Secondary | ICD-10-CM | POA: Diagnosis not present

## 2019-11-14 DIAGNOSIS — R0789 Other chest pain: Secondary | ICD-10-CM

## 2019-11-14 DIAGNOSIS — F3289 Other specified depressive episodes: Secondary | ICD-10-CM

## 2019-11-14 DIAGNOSIS — R5381 Other malaise: Secondary | ICD-10-CM | POA: Diagnosis not present

## 2019-12-09 ENCOUNTER — Telehealth: Payer: Self-pay

## 2019-12-12 ENCOUNTER — Other Ambulatory Visit: Payer: Self-pay

## 2019-12-12 ENCOUNTER — Encounter: Payer: Self-pay | Admitting: Emergency Medicine

## 2019-12-12 ENCOUNTER — Emergency Department: Payer: Medicare Other

## 2019-12-12 ENCOUNTER — Emergency Department
Admission: EM | Admit: 2019-12-12 | Discharge: 2019-12-12 | Disposition: A | Discharge status: Home / Self Care | Payer: Medicare Other | Attending: Emergency Medicine | Admitting: Emergency Medicine

## 2019-12-12 DIAGNOSIS — R079 Chest pain, unspecified: Secondary | ICD-10-CM | POA: Diagnosis not present

## 2019-12-12 DIAGNOSIS — R5383 Other fatigue: Secondary | ICD-10-CM | POA: Insufficient documentation

## 2019-12-12 DIAGNOSIS — Z5321 Procedure and treatment not carried out due to patient leaving prior to being seen by health care provider: Secondary | ICD-10-CM | POA: Diagnosis not present

## 2019-12-12 LAB — CBC
HCT: 40.1 % (ref 36.0–46.0)
Hemoglobin: 13.8 g/dL (ref 12.0–15.0)
MCH: 32.2 pg (ref 26.0–34.0)
MCHC: 34.4 g/dL (ref 30.0–36.0)
MCV: 93.7 fL (ref 80.0–100.0)
Platelets: 343 10*3/uL (ref 150–400)
RBC: 4.28 MIL/uL (ref 3.87–5.11)
RDW: 12.8 % (ref 11.5–15.5)
WBC: 7.4 10*3/uL (ref 4.0–10.5)
nRBC: 0 % (ref 0.0–0.2)

## 2019-12-12 LAB — BASIC METABOLIC PANEL
Anion gap: 9 (ref 5–15)
BUN: 25 mg/dL — ABNORMAL HIGH (ref 8–23)
CO2: 27 mmol/L (ref 22–32)
Calcium: 8.5 mg/dL — ABNORMAL LOW (ref 8.9–10.3)
Chloride: 100 mmol/L (ref 98–111)
Creatinine, Ser: 1.04 mg/dL — ABNORMAL HIGH (ref 0.44–1.00)
GFR calc Af Amer: >60 mL/min (ref >60)
GFR calc non Af Amer: 57 mL/min — ABNORMAL LOW (ref >60)
Glucose, Bld: 126 mg/dL — ABNORMAL HIGH (ref 70–99)
Potassium: 3.9 mmol/L (ref 3.5–5.1)
Sodium: 136 mmol/L (ref 135–145)

## 2019-12-12 LAB — TROPONIN I (HIGH SENSITIVITY): Troponin I (High Sensitivity): 4 ng/L (ref <18)

## 2019-12-12 MED ORDER — FLUTICASONE PROPIONATE 50 MCG/ACT NA SUSP: 2.0000 | Freq: Every day | NASAL | 11 refills | Status: AC | PRN

## 2019-12-12 NOTE — ED Notes (Signed)
First Nurse Note:  Pt in via [facility] from home.  Per EMS, pt with ongoing fatigue upon exertion x approximately one week, having episode of severe chest pain today with radiation to jaw.  Pt took two Nitro at home prior to EMS arrival.    324 ASA given per EMS.  22G to right hand in place upon arrival.    NAD noted at this time.

## 2019-12-12 NOTE — ED Triage Notes (Signed)
Pt via ems from home with chest pain today; states she had pain that "brought me to my knees." Pt states she has sob that happens "sometimes." She also reports that her left arm hurt when she had the pain. She denies any n/v/unusual sob. Pt states she is "under a lot of stress." She states that she took 2 nitro tablets when the pain hit; pain reduced to manageable level. Pt states that she still has chest pain at this time, but it is "nothing like it was when it hit me." Pt alert & oriented, nad noted.

## 2019-12-14 ENCOUNTER — Telehealth: Payer: Self-pay

## 2019-12-14 NOTE — Telephone Encounter (Signed)
  Chronic Care Management   Outreach Note  12/14/2019 Name: Linda Mejia MRN: [id number] DOB: 1955-09-04  Primary Care Provider: Towanda Malkin, MD Reason for referral : Chronic Care Management   An unsuccessful telephone outreach was attempted today. [name] is currently engaged with the chronic care management team. Per chart review, she was evaluated in the Emergency Department on 12/12/19.  Unable to leave a voice message due to phone messaging not being set up, but will plan to outreach again this week.    Follow Up Plan:  Will attempt outreach again this week.    Cristy [facility] Care Management [facility] [phone]

## 2019-12-16 ENCOUNTER — Ambulatory Visit: Payer: Self-pay

## 2019-12-16 DIAGNOSIS — I25119 Atherosclerotic heart disease of native coronary artery with unspecified angina pectoris: Secondary | ICD-10-CM

## 2019-12-16 NOTE — Chronic Care Management (AMB) (Signed)
Chronic Care Management   Follow Up Note   12/16/2019 Name: Linda Mejia MRN: [id number] DOB: 09/12/1955  Primary Care Provider: Towanda Malkin, MD Reason for referral : Chronic Care Management   Linda Mejia is a 64 y.o. year old female who is a primary care patient of Towanda Malkin, MD. A telephonic outreach was conducted today.   Review of her status, including review of consultants reports, relevant labs and test results was conducted today. Collaboration with appropriate care team members was performed as part of the comprehensive evaluation and provision of chronic care management services.    SDOH (Social Determinants of Health) assessments performed: No    Outpatient Encounter Medications as of 12/16/2019  Medication Sig Note  . acyclovir (ZOVIRAX) 400 MG tablet Take 1 tablet (400 mg total) by mouth 2 (two) times daily.   . Alirocumab (PRALUENT) 75 MG/ML SOAJ Inject 1 each into the skin every 14 (fourteen) days. 11/03/2019: Due now per patient  . amphetamine-dextroamphetamine (ADDERALL) 30 MG tablet Take 30 mg by mouth 2 (two) times daily.    Marland Kitchen aspirin EC 81 MG tablet Take 81 mg by mouth daily.   . cyanocobalamin 1000 MCG tablet Take 1,000 mcg by mouth daily.   . diazepam (VALIUM) 10 MG tablet Take 1 tablet (10 mg total) by mouth every 8 (eight) hours as needed for anxiety.   . fluticasone (FLONASE) 50 MCG/ACT nasal spray Place 2 sprays into both nostrils daily as needed for allergies.   . metoprolol tartrate (LOPRESSOR) 50 MG tablet TAKE 1 TABLET BY MOUTH TWICE DAILY (Patient taking differently: Take 50 mg by mouth 2 (two) times daily. )   . nitroGLYCERIN (NITROSTAT) 0.4 MG SL tablet Place 1 tablet (0.4 mg total) under the tongue every 5 (five) minutes as needed for chest pain. (Patient not taking: Reported on 11/14/2019)   . nystatin (MYCOSTATIN/NYSTOP) powder Apply topically 2 (two) times daily. (Patient taking differently: Apply topically as needed (rash). )   .  pantoprazole (PROTONIX) 40 MG tablet Take 1 tablet (40 mg total) by mouth daily.    No facility-administered encounter medications on file as of 12/16/2019.     Goals Addressed            This Visit's Progress   . COMPLETED: Assist with housing resources       CARE PLAN ENTRY (see longitudinal plan of care for additional care plan information)  Current Barriers:  . Care coordination needs related to community resources and housing assistance.  Clinical Goal(s)  . Over the next 60 days, patient will work with care management team to obtain needed community and housing resources. . Over the next 30 days, patient will update care management team with any urgent concerns or changes in care management needs.   Interventions:  . Discussed housing needs. Confirmed approval for housing at [facility]. Currently #2 on the waiting list and hoping to move within the next 30 days. Reports maintaining weekly contact with the Secondary school teacher. Confirmed that she is able to remain at her current residence until the move. Overall reports feeling well and remains very eager to relocate. She has developed a tentative budget and does not anticipate needs r/t rental cost, meals or transportation. Denies need for urgent assistance or additional referrals.  Agreed to contact the care management team if additional assistance is needed.    Patient Self Care Activities & Deficits:  . Self administers medications  . Calls for new concerns or questions .  Motivated to find housing and continue living independently.  Please see past updates related to this goal by clicking on the "Past Updates" button in the selected goal            PLAN Ms. Mcreynolds will contact the care management team if additional assistance is needed.   [facility] Care Management [facility] [phone]

## 2019-12-21 NOTE — Patient Instructions (Addendum)
Thank you for allowing the Chronic Care Management team to participate in your care.    Goals Addressed            This Visit's Progress   . COMPLETED: Assist with housing resources       CARE PLAN ENTRY (see longitudinal plan of care for additional care plan information)  Current Barriers:  . Care coordination needs related to community resources and housing assistance.  Clinical Goal(s)  . Over the next 60 days, patient will work with care management team to obtain needed community and housing resources. . Over the next 30 days, patient will update care management team with any urgent concerns or changes in care management needs.   Interventions:  . Discussed housing needs. Confirmed approval for housing at [facility]. Currently #2 on the waiting list and hoping to move within the next 30 days. Reports maintaining weekly contact with the Secondary school teacher. Confirmed that she is able to remain at her current residence until the move. Overall reports feeling well and remains very eager to relocate. She has developed a tentative budget and does not anticipate needs r/t rental cost, meals or transportation. Denies need for urgent assistance or additional referrals.  Agreed to contact the care management team if additional assistance is needed.    Patient Self Care Activities & Deficits:  . Self administers medications  . Calls for new concerns or questions . Motivated to find housing and continue living independently.  Please see past updates related to this goal by clicking on the "Past Updates" button in the selected goal         Ms. Iannelli verbalized understanding of the information discussed during the telephonic outreach today. Declined need for a mailed/printed copy of the instructions.   Ms. Schrodt will contact the care management team if additional assistance is needed.    [facility]risty [facility] Care Management [facility] [phone]

## 2019-12-22 ENCOUNTER — Other Ambulatory Visit: Payer: Self-pay

## 2019-12-22 ENCOUNTER — Ambulatory Visit (INDEPENDENT_AMBULATORY_CARE_PROVIDER_SITE_OTHER): Payer: Medicare Other

## 2019-12-22 DIAGNOSIS — Z0000 Encounter for general adult medical examination without abnormal findings: Secondary | ICD-10-CM | POA: Diagnosis not present

## 2019-12-22 DIAGNOSIS — Z789 Other specified health status: Secondary | ICD-10-CM

## 2019-12-22 NOTE — Patient Instructions (Signed)
Linda Mejia , Thank you for taking time to come for your Medicare Wellness Visit. I appreciate your ongoing commitment to your health goals. Please review the following plan we discussed and let me know if I can assist you in the future.   Screening recommendations/referrals: Colonoscopy: Cologuard done 06/14/17. Repeat in 2022. Mammogram: done 06/21/18. Please call [phone] to schedule your mammogram.  Bone Density: due at age 64.  Recommended yearly ophthalmology/optometry visit for glaucoma screening and checkup Recommended yearly dental visit for hygiene and checkup  Vaccinations: Influenza vaccine: due Pneumococcal vaccine: due at age 56 Tdap vaccine: done 06/09/16 Shingles vaccine: Shingrix discussed. Please contact your pharmacy for coverage information.  Covid-19: done 07/18/19 & 08/09/19  Advanced directives: Advance directive discussed with you today. Even though you declined this today please call our office should you change your mind and we can give you the proper paperwork for you to fill out.  Conditions/risks identified: Recommend contacting insurance for mental health benefits and counseling.   Here are some resources to help you if you feel you are in a mental health crisis:  [place] - Call [phone]  for help - Website with more resources: [email or web address].[email or web address]  [facility] - Call [phone] for help. - Mobile Crisis Program available 24 hours a day, 365 days a year. - Available for anyone of any age in [place] counties.  [facility] - Address: [address], [place] - Telephone: [phone]  - Hours of Operation: Sunday - Saturday - 8:00 a.m. - 8:00 p.m. - Medicaid, Medicare (Government Issued Only), BCBS, and [employer], [place], Psychiatrists on-site to provide medication management,  [place], and Peer Support Care.  [facility] - Call [phone] for help. - Mobile Crisis Program available 24 hours a day, 365 days a year. - Available for anyone of any age in [place]   Next appointment: Follow up in one year for your annual wellness visit.   Preventive Care 40-64 Years, Female Preventive care refers to lifestyle choices and visits with your health care provider that can promote health and wellness. What does preventive care include?  A yearly physical exam. This is also called an annual well check.  Dental exams once or twice a year.  Routine eye exams. Ask your health care provider how often you should have your eyes checked.  Personal lifestyle choices, including:  Daily care of your teeth and gums.  Regular physical activity.  Eating a healthy diet.  Avoiding tobacco and drug use.  Limiting alcohol use.  Practicing safe sex.  Taking low-dose aspirin daily starting at age 24.  Taking vitamin and mineral supplements as recommended by your health care provider. What happens during an annual well check? The services and screenings done by your health care provider during your annual well check will depend on your age, overall health, lifestyle risk factors, and family history of disease. Counseling  Your health care provider may ask you questions about your:  Alcohol use.  Tobacco use.  Drug use.  Emotional well-being.  Home and relationship well-being.  Sexual activity.  Eating habits.  Work and work Statistician.  Method of birth control.  Menstrual cycle.  Pregnancy history. Screening  You may have the following tests or measurements:  Height, weight, and BMI.  Blood pressure.  Lipid and cholesterol levels. These may be checked every 5 years, or more frequently  if you are over 19 years old.  Skin check.  Lung cancer screening. You may have this screening every year  starting at age 30 if you have a 30-pack-year history of smoking and currently smoke or have quit within the past 15 years.  Fecal occult blood test (FOBT) of the stool. You may have this test every year starting at age 29.  Flexible sigmoidoscopy or colonoscopy. You may have a sigmoidoscopy every 5 years or a colonoscopy every 10 years starting at age 75.  Hepatitis C blood test.  Hepatitis B blood test.  Sexually transmitted disease (STD) testing.  Diabetes screening. This is done by checking your blood sugar (glucose) after you have not eaten for a while (fasting). You may have this done every 1-3 years.  Mammogram. This may be done every 1-2 years. Talk to your health care provider about when you should start having regular mammograms. This may depend on whether you have a family history of breast cancer.  BRCA-related cancer screening. This may be done if you have a family history of breast, ovarian, tubal, or peritoneal cancers.  Pelvic exam and Pap test. This may be done every 3 years starting at age 12. Starting at age 34, this may be done every 5 years if you have a Pap test in combination with an HPV test.  Bone density scan. This is done to screen for osteoporosis. You may have this scan if you are at high risk for osteoporosis. Discuss your test results, treatment options, and if necessary, the need for more tests with your health care provider. Vaccines  Your health care provider may recommend certain vaccines, such as:  Influenza vaccine. This is recommended every year.  Tetanus, diphtheria, and acellular pertussis (Tdap, Td) vaccine. You may need a Td booster every 10 years.  Zoster vaccine. You may need this after age 41.  Pneumococcal 13-valent conjugate (PCV13) vaccine. You may need this if you have certain conditions and were not previously vaccinated.  Pneumococcal polysaccharide (PPSV23) vaccine. You may need one or two doses if you smoke cigarettes or if you  have certain conditions. Talk to your health care provider about which screenings and vaccines you need and how often you need them. This information is not intended to replace advice given to you by your health care provider. Make sure you discuss any questions you have with your health care provider. Document Released: 04/27/2015 Document Revised: 12/19/2015 Document Reviewed: 01/30/2015 Elsevier Interactive Patient Education  2017 [place] Prevention in the Home Falls can cause injuries. They can happen to people of all ages. There are many things you can do to make your home safe and to help prevent falls. What can I do on the outside of my home?  Regularly fix the edges of walkways and driveways and fix any cracks.  Remove anything that might make you trip as you walk through a door, such as a raised step or threshold.  Trim any bushes or trees on the path to your home.  Use bright outdoor lighting.  Clear any walking paths of anything that might make someone trip, such as rocks or tools.  Regularly check to see if handrails are loose or broken. Make sure that both sides of any steps have handrails.  Any raised decks and porches should have guardrails on the edges.  Have any leaves, snow, or ice cleared regularly.  Use sand or salt on walking paths during winter.  Clean up any  spills in your garage right away. This includes oil or grease spills. What can I do in the bathroom?  Use night lights.  Install grab bars by the toilet and in the tub and shower. Do not use towel bars as grab bars.  Use non-skid mats or decals in the tub or shower.  If you need to sit down in the shower, use a plastic, non-slip stool.  Keep the floor dry. Clean up any water that spills on the floor as soon as it happens.  Remove soap buildup in the tub or shower regularly.  Attach bath mats securely with double-sided non-slip rug tape.  Do not have throw rugs and other things  on the floor that can make you trip. What can I do in the bedroom?  Use night lights.  Make sure that you have a light by your bed that is easy to reach.  Do not use any sheets or blankets that are too big for your bed. They should not hang down onto the floor.  Have a firm chair that has side arms. You can use this for support while you get dressed.  Do not have throw rugs and other things on the floor that can make you trip. What can I do in the kitchen?  Clean up any spills right away.  Avoid walking on wet floors.  Keep items that you use a lot in easy-to-reach places.  If you need to reach something above you, use a strong step stool that has a grab bar.  Keep electrical cords out of the way.  Do not use floor polish or wax that makes floors slippery. If you must use wax, use non-skid floor wax.  Do not have throw rugs and other things on the floor that can make you trip. What can I do with my stairs?  Do not leave any items on the stairs.  Make sure that there are handrails on both sides of the stairs and use them. Fix handrails that are broken or loose. Make sure that handrails are as long as the stairways.  Check any carpeting to make sure that it is firmly attached to the stairs. Fix any carpet that is loose or worn.  Avoid having throw rugs at the top or bottom of the stairs. If you do have throw rugs, attach them to the floor with carpet tape.  Make sure that you have a light switch at the top of the stairs and the bottom of the stairs. If you do not have them, ask someone to add them for you. What else can I do to help prevent falls?  Wear shoes that:  Do not have high heels.  Have rubber bottoms.  Are comfortable and fit you well.  Are closed at the toe. Do not wear sandals.  If you use a stepladder:  Make sure that it is fully opened. Do not climb a closed stepladder.  Make sure that both sides of the stepladder are locked into place.  Ask someone  to hold it for you, if possible.  Clearly mark and make sure that you can see:  Any grab bars or handrails.  First and last steps.  Where the edge of each step is.  Use tools that help you move around (mobility aids) if they are needed. These include:  Canes.  Walkers.  Scooters.  Crutches.  Turn on the lights when you go into a dark area. Replace any light bulbs as soon as they burn  out.  Set up your furniture so you have a clear path. Avoid moving your furniture around.  If any of your floors are uneven, fix them.  If there are any pets around you, be aware of where they are.  Review your medicines with your doctor. Some medicines can make you feel dizzy. This can increase your chance of falling. Ask your doctor what other things that you can do to help prevent falls. This information is not intended to replace advice given to you by your health care provider. Make sure you discuss any questions you have with your health care provider. Document Released: 01/25/2009 Document Revised: 09/06/2015 Document Reviewed: 05/05/2014 Elsevier Interactive Patient Education  2017 [employer].

## 2019-12-22 NOTE — Progress Notes (Signed)
Subjective:   Linda Mejia is a 64 y.o. female who presents for Medicare Annual (Subsequent) preventive examination.  Virtual Visit via Telephone Note  I connected with  Linda Mejia on 12/22/19 at 11:20 AM EDT by telephone and verified that I am speaking with the correct person using two identifiers.  Medicare Annual Wellness visit completed telephonically due to Covid-19 pandemic.   Location: Patient: home Provider: [place]   I discussed the limitations, risks, security and privacy concerns of performing an evaluation and management service by telephone and the availability of in person appointments. The patient expressed understanding and agreed to proceed.  Unable to perform video visit due to video visit attempted and failed and/or patient does not have video capability.   Some vital signs may be absent or patient reported.   Clemetine Marker, LPN    Review of Systems           Objective:    There were no vitals filed for this visit. There is no height or weight on file to calculate BMI.  Advanced Directives 12/12/2019 11/04/2019 11/03/2019 10/24/2019 08/26/2019 01/28/2019 01/27/2019  Does Patient Have a Medical Advance Directive? Yes;No No No No No No No  Would patient like information on creating a medical advance directive? - - - No - Patient declined - No - Patient declined No - Patient declined  Some encounter information is confidential and restricted. Go to Review Flowsheets activity to see all data.    Current Medications (verified) Outpatient Encounter Medications as of 12/22/2019  Medication Sig  . acyclovir (ZOVIRAX) 400 MG tablet Take 1 tablet (400 mg total) by mouth 2 (two) times daily.  . Alirocumab (PRALUENT) 75 MG/ML SOAJ Inject 1 each into the skin every 14 (fourteen) days.  Marland Kitchen amphetamine-dextroamphetamine (ADDERALL) 30 MG tablet Take 30 mg by mouth 2 (two) times daily.   Marland Kitchen aspirin EC 81 MG tablet Take 81 mg by mouth daily.  . cyanocobalamin 1000 MCG tablet Take  1,000 mcg by mouth daily.  . diazepam (VALIUM) 10 MG tablet Take 1 tablet (10 mg total) by mouth every 8 (eight) hours as needed for anxiety.  . fluticasone (FLONASE) 50 MCG/ACT nasal spray Place 2 sprays into both nostrils daily as needed for allergies.  . metoprolol tartrate (LOPRESSOR) 50 MG tablet TAKE 1 TABLET BY MOUTH TWICE DAILY (Patient taking differently: Take 50 mg by mouth 2 (two) times daily. )  . nitroGLYCERIN (NITROSTAT) 0.4 MG SL tablet Place 1 tablet (0.4 mg total) under the tongue every 5 (five) minutes as needed for chest pain. (Patient not taking: Reported on 11/14/2019)  . nystatin (MYCOSTATIN/NYSTOP) powder Apply topically 2 (two) times daily. (Patient taking differently: Apply topically as needed (rash). )  . pantoprazole (PROTONIX) 40 MG tablet Take 1 tablet (40 mg total) by mouth daily.   No facility-administered encounter medications on file as of 12/22/2019.    Allergies (verified) Amlodipine and Statins   History: Past Medical History:  Diagnosis Date  . Abnormal ankle brachial index (ABI) 12/02/2016  . ADHD (attention deficit hyperactivity disorder)   . Anxiety   . Aortic atherosclerosis ([place]) 12/02/2016   July 2018  . Chronic back pain   . Coronary artery disease   . Herpes genitalis in women   . History of stomach ulcers    x7 this year. Bleeding  . Hypertension   . Prediabetes 12/02/2016  . Pulmonary hypertension ([place]) 03/09/2018   Echo Oct 2019   Past Surgical History:  Procedure  Laterality Date  . APPENDECTOMY    . CARDIAC CATHETERIZATION    . CESAREAN SECTION     x2  . COLON SURGERY     interception  . CORONARY ANGIOPLASTY     stents...  2000  . ESOPHAGOGASTRODUODENOSCOPY (EGD) WITH PROPOFOL N/A 09/21/2015   Procedure: ESOPHAGOGASTRODUODENOSCOPY (EGD) WITH PROPOFOL;  Surgeon: Lucilla Lame, MD;  Location: [facility] ENDOSCOPY;  Service: Endoscopy;  Laterality: N/A;  . ESOPHAGOGASTRODUODENOSCOPY (EGD) WITH PROPOFOL N/A 07/30/2016   Procedure:  ESOPHAGOGASTRODUODENOSCOPY (EGD) WITH PROPOFOL;  Surgeon: Jonathon Bellows, MD;  Location: [facility] ENDOSCOPY;  Service: Endoscopy;  Laterality: N/A;  . ESOPHAGOGASTRODUODENOSCOPY (EGD) WITH PROPOFOL N/A 04/23/2018   Procedure: ESOPHAGOGASTRODUODENOSCOPY (EGD) WITH PROPOFOL;  Surgeon: Virgel Manifold, MD;  Location: [facility] ENDOSCOPY;  Service: Endoscopy;  Laterality: N/A;  . ESOPHAGOGASTRODUODENOSCOPY (EGD) WITH PROPOFOL N/A 11/04/2019   Procedure: ESOPHAGOGASTRODUODENOSCOPY (EGD) WITH PROPOFOL;  Surgeon: Lin Landsman, MD;  Location: [facility] ENDOSCOPY;  Service: Gastroenterology;  Laterality: N/A;  . LEFT HEART CATH Right 01/25/2018   Procedure: Left Heart Cath and Coronary Angiography;  Surgeon: Dionisio David, MD;  Location: [place] [facility];  Service: Cardiovascular;  Laterality: Right;  . LEFT HEART CATH AND CORONARY ANGIOGRAPHY Right 08/12/2016   Procedure: Left Heart Cath and Coronary Angiography;  Surgeon: Dionisio David, MD;  Location: [place] [facility];  Service: Cardiovascular;  Laterality: Right;  . TONSILLECTOMY    . TUBAL LIGATION     Family History  Problem Relation Age of Onset  . Diabetes Mother   . CVA Mother   . Heart disease Father   . Heart disease Sister   . Breast cancer Sister   . Heart disease Brother   . Heart disease Sister   . Heart disease Sister   . Heart disease Sister   . Heart disease Brother   . Heart disease Brother   . Heart disease Brother   . Heart disease Brother   . Healthy Daughter   . Healthy Daughter    Social History   Socioeconomic History  . Marital status: Divorced    Spouse name: Not on file  . Number of children: 2  . Years of education: Not on file  . Highest education level: GED or equivalent  Occupational History  . Occupation: disabled  Tobacco Use  . Smoking status: Former Smoker    Packs/day: 1.00    Years: 30.00    Pack years: 30.00    Types: Cigarettes    Quit date: 2006    Years since quitting: 15.6  . Smokeless  tobacco: Never Used  Vaping Use  . Vaping Use: Never used  Substance and Sexual Activity  . Alcohol use: Yes  . Drug use: No  . Sexual activity: Not Currently    Birth control/protection: None, Post-menopausal  Other Topics Concern  . Not on file  Social History Narrative  . Not on file   Social Determinants of Health   Financial Resource Strain:   . Difficulty of Paying Living Expenses: Not on file  Food Insecurity:   . Worried About Charity fundraiser in the Last Year: Not on file  . Ran Out of Food in the Last Year: Not on file  Transportation Needs: No Transportation Needs  . Lack of Transportation (Medical): No  . Lack of Transportation (Non-Medical): No  Physical Activity:   . Days of Exercise per Week: Not on file  . Minutes of Exercise per Session: Not on file  Stress: Stress Concern  Present  . Feeling of Stress : Rather much  Social Connections:   . Frequency of Communication with Friends and Family: Not on file  . Frequency of Social Gatherings with Friends and Family: Not on file  . Attends Religious Services: Not on file  . Active Member of Clubs or Organizations: Not on file  . Attends Archivist Meetings: Not on file  . Marital Status: Not on file    Tobacco Counseling Counseling given: Not Answered   Clinical Intake:                          Activities of Daily Living In your present state of health, do you have any difficulty performing the following activities: 11/14/2019 10/25/2019  Hearing? N N  Vision? N N  Comment - -  Difficulty concentrating or making decisions? N N  Walking or climbing stairs? N Y  Comment - -  Dressing or bathing? N N  Doing errands, shopping? N N  Preparing Food and eating ? - -  Using the Toilet? - -  In the past six months, have you accidently leaked urine? - -  Do you have problems with loss of bowel control? - -  Managing your Medications? - -  Managing your Finances? - -  Housekeeping  or managing your Housekeeping? - -  Some recent data might be hidden    Patient Care Team: Towanda Malkin, MD as PCP - General (Internal Medicine) Neldon Labella, RN as Case Manager  Indicate any recent Medical Services you may have received from other than [facility] providers in the past year (date may be approximate).     Assessment:   This is a routine wellness examination for Emalene.  Hearing/Vision screen No exam data present  Dietary issues and exercise activities discussed:    Goals    . DIET - INCREASE WATER INTAKE     Recommend to drink at least 6-8 8oz glasses of water per day.    . Increase physical activity     Recommend increasing physical activity to at least 3 days per week      Depression Screen [facility] 2/9 Scores 11/14/2019 10/25/2019 09/23/2019 05/27/2019 02/10/2019 12/16/2018 12/06/2018  PHQ - 2 Score 1 0 1 0 0 - 6  PHQ- 9 Score 3 0 - - 0 - 19  Exception Documentation - - - - - Patient refusal -    Fall Risk Fall Risk  11/14/2019 10/25/2019 05/27/2019 02/10/2019 12/16/2018  Falls in the past year? 0 0 0 0 0  Number falls in past yr: 0 0 - 0 0  Injury with Fall? 0 0 - 0 0  Comment - - - - -  Risk for fall due to : - - Medication side effect;Other (Comment) - -  Risk for fall due to: Comment - - History of Joint Pain - -  Follow up - - Falls prevention discussed Falls evaluation completed Falls prevention discussed    Any stairs in or around the home? Yes  If so, are there any without handrails? No  Home free of loose throw rugs in walkways, pet beds, electrical cords, etc? Yes  Adequate lighting in your home to reduce risk of falls? Yes   ASSISTIVE DEVICES UTILIZED TO PREVENT FALLS:  Life alert? No  Use of a cane, walker or w/c? No  Grab bars in the bathroom? No  Shower chair or bench in shower? No  Elevated toilet  seat or a handicapped toilet? No   TIMED UP AND GO:  Was the test performed? No . Telephonic visit.   Cognitive Function: 6CIT deferred  for 2021 AWV - pt emotionally upset during visit.      6CIT Screen 12/16/2018 12/15/2017  What Year? 0 points 0 points  What month? 0 points 0 points  What time? 0 points 0 points  Count back from 20 0 points 0 points  Months in reverse 0 points 0 points  Repeat phrase 2 points 2 points  Total Score 2 2    Immunizations Immunization History  Administered Date(s) Administered  . Influenza Inj Mdck Quad Pf 12/06/2018  . Influenza,inj,Quad PF,6+ Mos 03/26/2015, 06/09/2016, 03/18/2017, 12/15/2017  . PFIZER SARS-COV-2 Vaccination 07/18/2019, 08/09/2019  . Tdap 06/09/2016    TDAP status: Up to date   Flu vaccine: due for 2021 season  Pneumococcal vaccine: due at age 79  Covid-19 vaccine status: Completed vaccines  Qualifies for Shingles Vaccine? Yes   Zostavax completed No   Shingrix Completed?: No.    Education has been provided regarding the importance of this vaccine. Patient has been advised to call insurance company to determine out of pocket expense if they have not yet received this vaccine. Advised may also receive vaccine at local pharmacy or Health Dept. Verbalized acceptance and understanding.  Screening Tests Health Maintenance  Topic Date Due  . PAP SMEAR-Modifier  03/25/2018  . MAMMOGRAM  06/21/2019  . INFLUENZA VACCINE  11/13/2019  . Fecal DNA (Cologuard)  06/24/2020  . TETANUS/TDAP  06/09/2026  . COVID-19 Vaccine  Completed  . Hepatitis C Screening  Completed  . HIV Screening  Completed    Health Maintenance  Health Maintenance Due  Topic Date Due  . PAP SMEAR-Modifier  03/25/2018  . MAMMOGRAM  06/21/2019  . INFLUENZA VACCINE  11/13/2019   Colorectal cancer screening:  Cologuard completed 06/24/17. Repeat every 3 years.   Mammogram status: Completed 06/21/18. Repeat every year. Ordered today.   Bone density screening: due at age 36  Lung Cancer Screening: (Low Dose CT Chest recommended if Age 22-80 years, 30 pack-year currently smoking OR have quit w/in  15years.) does not qualify.   Additional Screening:  Hepatitis C Screening: does qualify; Completed 06/09/16.   Vision Screening: Recommended annual ophthalmology exams for early detection of glaucoma and other disorders of the eye. Is the patient up to date with their annual eye exam?  No  Who is the provider or what is the name of the office in which the patient attends annual eye exams? Plans to establish care at [facility] Screening: Recommended annual dental exams for proper oral hygiene  Community Resource Referral / Chronic Care Management: CRR required this visit?  Yes   CCM required this visit?  No      Plan:     I have personally reviewed and noted the following in the patient's chart:   . Medical and social history . Use of alcohol, tobacco or illicit drugs  . Current medications and supplements . Functional ability and status . Nutritional status . Physical activity . Advanced directives . List of other physicians . Hospitalizations, surgeries, and ER visits in previous 12 months . Vitals . Screenings to include cognitive, depression, and falls . Referrals and appointments  In addition, I have reviewed and discussed with patient certain preventive protocols, quality metrics, and best practice recommendations. A written personalized care plan for preventive services as well as general preventive health  recommendations were provided to patient.     Clemetine Marker, LPN   0/[id number]   Nurse Notes: patient was very upset during telephone visit today stating she had not wanted to discuss what was troubling her but she is very frustrated, angry, sad and extremely anxious about her current living situation with her daughter. PHQ 9 today of 19. She is not taking any antidepressants only valium. She states she feels restless, useless and wakes up panicking with night sweats. Patient has been working with CCM social worker Chrystal Land LCSW to assist in finding  new housing and she is #2 on the wait list for [employer]. Referral sent to C3 team for local counseling resources and patient encouraged to contact insurance for additional mental health benefits. Patient also advised to contact office if needed and encouraged to schedule follow up with Dr. Roxan Hockey if needed.

## 2019-12-26 ENCOUNTER — Ambulatory Visit: Payer: Self-pay

## 2019-12-26 ENCOUNTER — Other Ambulatory Visit: Payer: Self-pay

## 2019-12-26 NOTE — Progress Notes (Signed)
Patient is a

## 2019-12-26 NOTE — Chronic Care Management (AMB) (Signed)
   Care Management   Note  12/26/2019 Name: Linda Mejia MRN: [id number] DOB: June 02, 1955   Care Coordination: Assist with Appointment Scheduling Assisted with scheduling PCP outreach per patient request.  Outreach with Dr. Roxan Hockey scheduled for 12/27/19. This outreach will be virtual per clinic protocol due  to [name] reporting coughing and new onset of respiratory symptoms over the weekend. She confirmed a pending acute outreach with the [place] clinic later today. Informed of pending virtual visit with PCP on 12/27/19 at 1040.     Cristy [facility] Care [facility] [phone]

## 2019-12-27 ENCOUNTER — Encounter: Payer: Medicare Other | Admitting: Internal Medicine

## 2020-01-04 ENCOUNTER — Telehealth: Payer: Self-pay

## 2020-01-04 NOTE — Progress Notes (Signed)
Patient ID: Linda Mejia, female    DOB: December 26, 1955, 64 y.o.   MRN: [id number]  PCP: Towanda Malkin, MD  Chief Complaint  Patient presents with  . Follow-up    would like to discuss possible sleep apnea    Subjective:   Linda Mejia is a 64 y.o. female, presents to clinic with CC of the following:  Chief Complaint  Patient presents with  . Follow-up    would like to discuss possible sleep apnea    HPI:  Patient is a 64 year old female Last visit with me was 11/18/2019 for follow-up after hospitalization. Follows up today after seen for possible Covid concern recent past and after hospitalization 12/12/2019  On 12/26/2019, she was evaluated for Covid infection concern with the assessment as follows:  64 year old female with history of COPD presents for possible Covid infection. She had body aches headache cough and diarrhea. Vital signs are stable, O2 sats 93%. No wheezing but patient did sound tight. Lungs were clear. Covid and influenza test negative. She was started on prednisone taper, albuterol inhaler and Z-Pak.   She did not take any of these medicines due to concerns for SE's  Those sx's resolved   The influenza and Covid test returned negative.  Chest pains -has been evaluated on a prior hospitalization before her last visit with me 11/18/2019 with no acute cardiac source noted, CT scans were done as well as GI being involved with procedures done with no obvious source identified.  She has remained on a PPI which helps with reflux symptoms.  Patient seen again in the emergency room 12/12/2019 for acute chest pain with radiation to the jaw.  She left the ER due to wait and continues to follow up with cardiology after. Had monitor on last three days, just removed this morning.  She continues to be followed by cardiology, with a visit noted 01/02/2020.  She has  follow-ups scheduled with Dr. Clayborn Bigness again in October.             CAD - The patient had a cardiac  catheterization most recently in 2019 showing stable lesions with an occluded RCA but good collaterals  HTN: amlodipine was stopped previously due to swelling in her legs, now taking metoprolol 50 mg twice daily without issues;  Not check her pressures at home BP good last visit with cardiology BP Readings from Last 3 Encounters:  01/05/20 138/88  12/12/19 (!) 144/74  11/14/19 140/80   Denies any recent chest pains, increased shortness of breath (just went away), no increased lower extremity swelling, +  Headaches recently and tylenol helps, vision changes  +ANA/Polyarthralgia/osteoarthritis/malaise:  Followed by rheumatology. Last visit with Dr. Jefm Bryant was 12/15/2019 with the following assessment/plan:  Osteoarthritis  -both shoulders -Lumbar disc disease with spondylolisthesis. May be culprit in her pelvic pain -Degenerative change in the thoracic spine with possible prior compression fracture. Prior injury with rib fractures. Osteopenia on bone density Not enough evidence of inflammatory disease Anxiety Recent chest pain History of gastritis  Plan: Encouraged cardiology opinion and follow-up with psychiatrist. No injection today Tylenol She will call back when having more joint pain.  Was seen a month prior, the day after my last visit with her with the following assessment:  Assessment: Osteoarthritis both shoulders Lumbar disc disease with spondylolisthesis. May be culprit in her pelvic pain Degenerative change in the thoracic spine with possible prior compression fracture. Prior injury with rib fractures. Rule out osteoporosis Not enough evidence  of inflammatory disease Anxiety    Obesity/Prediabetes:  Wt Readings from Last 3 Encounters:  01/05/20 216 lb 6.4 oz (98.2 kg)  12/12/19 198 lb (89.8 kg)  11/14/19 217 lb 3.2 oz (98.5 kg)   Lab Results  Component Value Date   HGBA1C 6.1 (H) 12/06/2018   HGBA1C 5.8 (H) 01/24/2018   HGBA1C 5.8 (H) 03/18/2017   Lab  Results  Component Value Date   LDLCALC 229 (H) 12/06/2018   CREATININE 1.04 (H) 12/12/2019     HLD:  allergic to statins noted, was seeing cardiology for this previously Med regimen - Praluent injection every 2 weeks Lab Results  Component Value Date   CHOL 332 (H) 12/06/2018   HDL 58 12/06/2018   LDLCALC 229 (H) 12/06/2018   TRIG 250 (H) 12/06/2018   CHOLHDL 5.7 (H) 12/06/2018   Anxiety/depression concerns She notes her stresses have been high recently, and thinks that is a factor in her physical symptoms at times.  She has seen psychiatry in the past, and had a televisit yesterday with psychiatry.  She noted she feels better after talking with him again yesterday.  An in person follow-up was felt best and is now scheduled for her after that visit yesterday.  No medications were added to her regimen after that visit yesterday. She denied major depressive concerns after reviewing the PHQ-9 with her today which was very positive, and states " I am not depressed, just stressed".    Patient Active Problem List   Diagnosis Date Noted  . Depression 11/14/2019  . Hypoxia 11/04/2019  . Abdominal distension   . Acute respiratory failure with hypoxia ([place]) 11/03/2019  . Lactic acidosis 11/03/2019  . AKI (acute kidney injury) ([place]) 11/03/2019  . Malaise 11/03/2019  . Cellulitis 01/28/2019  . 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 ANA positive 09/22/2018  . 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 Tenosynovitis of hand 09/22/2018  . Breast mass, right 06/14/2018  . Acute peptic ulcer of stomach   . Stomach irritation   . Abdominal pain, epigastric   . Acute gastric ulcer due to Helicobacter pylori   . Pulmonary hypertension ([place]) 03/09/2018  . Unstable angina ([place]) 01/23/2018  . Medication monitoring encounter 03/18/2017  . Colon cancer screening 03/18/2017  . Coronary artery calcification seen on CT scan 12/02/2016  . Aortic  atherosclerosis ([place]) 12/02/2016  . Abnormal ankle brachial index (ABI) 12/02/2016  . Prediabetes 12/02/2016  . Fracture of rib 08/28/2016  . Atypical chest pain 08/11/2016  . Hx of tobacco use, presenting hazards to health 06/09/2016  . Degenerative arthritis of knee, bilateral 12/27/2015  . GI bleed 09/21/2015  . Reflux esophagitis   . Nausea and vomiting   . Polyarthralgia 06/05/2015  . Night sweats 06/05/2015  . Headache 04/12/2015  . Subclinical hypothyroidism 03/26/2015  . Herpes simplex type 2 infection 03/21/2015  . Rhinitis, allergic 03/21/2015  . ADHD (attention deficit hyperactivity disorder), inattentive type 09/20/2014  . Episodic paroxysmal anxiety disorder 09/20/2014  . History of gastric ulcer 09/20/2014  . Substance abuse ([place]) 09/20/2014  . HLD (hyperlipidemia) 10/03/2009  . Coronary artery disease involving native coronary artery of native heart with angina pectoris ([place]) 10/02/2009  . Hypertension goal BP (blood pressure) < 140/90 10/02/2009      Current Outpatient Medications:  .  acyclovir (ZOVIRAX) 400 MG tablet, Take 1 tablet (400 mg total) by mouth 2 (two) times daily., Disp: 60 tablet, Rfl: 5 .  Alirocumab (PRALUENT) 75 MG/ML SOAJ, Inject 1 each into the skin every 14 (fourteen) days., Disp: 6 pen, Rfl: 3 .  amphetamine-dextroamphetamine (ADDERALL) 30 MG tablet, Take 30 mg by mouth 2 (two) times daily. , Disp: , Rfl:  .  aspirin EC 81 MG tablet, Take 81 mg by mouth daily., Disp: , Rfl:  .  cholecalciferol (VITAMIN D3) 25 MCG (1000 UNIT) tablet, Take 1,000 Units by mouth daily., Disp: , Rfl:  .  cyanocobalamin 1000 MCG tablet, Take 1,000 mcg by mouth daily., Disp: , Rfl:  .  diazepam (VALIUM) 10 MG tablet, Take 1 tablet (10 mg total) by mouth every 8 (eight) hours as needed for anxiety., Disp: 30 tablet, Rfl: 0 .  fluticasone (FLONASE) 50 MCG/ACT nasal spray, Place 2 sprays into both nostrils daily as needed for allergies., Disp: 16 g, Rfl: 11 .   metoprolol tartrate (LOPRESSOR) 50 MG tablet, TAKE 1 TABLET BY MOUTH TWICE DAILY (Patient taking differently: Take 50 mg by mouth 2 (two) times daily. ), Disp: 60 tablet, Rfl: 5 .  nitroGLYCERIN (NITROSTAT) 0.4 MG SL tablet, Place 1 tablet (0.4 mg total) under the tongue every 5 (five) minutes as needed for chest pain., Disp: 30 tablet, Rfl: 0 .  nystatin (MYCOSTATIN/NYSTOP) powder, Apply topically 2 (two) times daily. (Patient taking differently: Apply topically as needed (rash). ), Disp: 15 g, Rfl: 0 .  pantoprazole (PROTONIX) 40 MG tablet, Take 1 tablet (40 mg total) by mouth daily., Disp: 90 tablet, Rfl: 0   Allergies  Allergen Reactions  . Amlodipine     Leg swelling  . Statins Other (See Comments)     Past Surgical History:  Procedure Laterality Date  . APPENDECTOMY    . CARDIAC CATHETERIZATION    . CESAREAN SECTION     x2  . COLON SURGERY     interception  . CORONARY ANGIOPLASTY     stents...  2000  . ESOPHAGOGASTRODUODENOSCOPY (EGD) WITH PROPOFOL N/A 09/21/2015   Procedure: ESOPHAGOGASTRODUODENOSCOPY (EGD) WITH PROPOFOL;  Surgeon: Lucilla Lame, MD;  Location: [facility] ENDOSCOPY;  Service: Endoscopy;  Laterality: N/A;  . ESOPHAGOGASTRODUODENOSCOPY (EGD) WITH PROPOFOL N/A 07/30/2016   Procedure: ESOPHAGOGASTRODUODENOSCOPY (EGD) WITH PROPOFOL;  Surgeon: Jonathon Bellows, MD;  Location: [facility] ENDOSCOPY;  Service: Endoscopy;  Laterality: N/A;  . ESOPHAGOGASTRODUODENOSCOPY (EGD) WITH PROPOFOL N/A 04/23/2018   Procedure: ESOPHAGOGASTRODUODENOSCOPY (EGD) WITH PROPOFOL;  Surgeon: Virgel Manifold, MD;  Location: [facility] ENDOSCOPY;  Service: Endoscopy;  Laterality: N/A;  . ESOPHAGOGASTRODUODENOSCOPY (EGD) WITH PROPOFOL N/A 11/04/2019   Procedure: ESOPHAGOGASTRODUODENOSCOPY (EGD) WITH PROPOFOL;  Surgeon: Lin Landsman, MD;  Location: [facility] ENDOSCOPY;  Service: Gastroenterology;  Laterality: N/A;  . EYE SURGERY  Dec 2021   Droopy eye lids  . LEFT HEART CATH Right 01/25/2018   Procedure: Left  Heart Cath and Coronary Angiography;  Surgeon: Dionisio David, MD;  Location: [place] CV LAB;  Service: Cardiovascular;  Laterality: Right;  . LEFT HEART CATH AND CORONARY ANGIOGRAPHY Right 08/12/2016   Procedure: Left Heart Cath and Coronary Angiography;  Surgeon: Dionisio David, MD;  Location: [place] CV LAB;  Service: Cardiovascular;  Laterality: Right;  . SMALL INTESTINE SURGERY    . TONSILLECTOMY    . TUBAL LIGATION       Family History  Problem Relation Age of Onset  . Diabetes Mother   . CVA Mother   . Heart disease Father   . Heart disease Sister   . Breast cancer Sister   . Heart disease Brother   .  Heart disease Sister   . Heart disease Sister   . Heart disease Sister   . Heart disease Brother   . Heart disease Brother   . Heart disease Brother   . Heart disease Brother   . Healthy Daughter   . Healthy Daughter      Social History   Tobacco Use  . Smoking status: Former Smoker    Packs/day: 1.00    Years: 30.00    Pack years: 30.00    Types: Cigarettes    Quit date: 2006    Years since quitting: 15.7  . Smokeless tobacco: Never Used  . Tobacco comment: I quit smoking cigarettes  Substance Use Topics  . Alcohol use: Yes    Alcohol/week: 1.0 standard drink    Types: 1 Glasses of wine per week    Comment: occasionally     With staff assistance, above reviewed with the patient today.  ROS: As per HPI, otherwise no specific complaints on a limited and focused system review   No results found for this or any previous visit (from the past 72 hour(s)).   PHQ2/9: Depression screen [facility] 2/9 01/05/2020 12/22/2019 11/14/2019 10/25/2019 09/23/2019  Decreased Interest 3 3 0 0 0  Down, Depressed, Hopeless - 3 1 0 1  PHQ - 2 Score 3 6 1  0 1  Altered sleeping 3 3 1  0 -  Tired, decreased energy 3 - 1 0 -  Change in appetite 3 0 0 0 -  Feeling bad or failure about yourself  3 3 0 0 -  Trouble concentrating 1 3 0 0 -  Moving slowly or fidgety/restless 3 3 0 0  -  Suicidal thoughts 0 1 0 0 -  PHQ-9 Score 19 19 3  0 -  Difficult doing work/chores Extremely dIfficult Very difficult Not difficult at all Not difficult at all -  Some recent data might be hidden   PHQ-2/9 Result reviewed, seeing psychiatry, had visit yesterday  Fall Risk: Fall Risk  01/05/2020 12/27/2019 12/22/2019 11/14/2019 10/25/2019  Falls in the past year? 0 0 0 0 0  Number falls in past yr: 0 0 0 0 0  Injury with Fall? 0 0 0 0 0  Comment - - - - -  Risk for fall due to : - - No Fall Risks - -  Risk for fall due to: Comment - - - - -  Follow up Falls evaluation completed Falls evaluation completed Falls prevention discussed - -      Objective:   Vitals:   01/05/20 1045  BP: 138/88  Pulse: 83  Resp: 16  Temp: 97.9 F (36.6 C)  TempSrc: Oral  SpO2: 98%  Weight: 216 lb 6.4 oz (98.2 kg)  Height: 5\' 5"  (1.651 m)    Body mass index is 36.01 kg/m.  Physical Exam   NAD, masked, mildly anxious appearing HEENT - [place]/AT, sclera anicteric, PERRL, EOMI, conj - non-inj'ed, pharynx clear Neck - supple, no adenopathy,  Car - RRR without m/g/r Pulm- RR and effort normal at rest, CTA without wheeze or rales Abd - soft,obese,diffusely nontender,  Back - no CVA tenderness, Ext - no LE edema,  Neuro/psychiatric - affect was not flat, appropriate with conversation Alert  Grossly non-focal  Speech normal   Results for orders placed or performed during the hospital encounter of 40/76/80  Basic metabolic panel  Result Value Ref Range   Sodium 136 135 - 145 mmol/L   Potassium 3.9 3.5 - 5.1 mmol/L  Chloride 100 98 - 111 mmol/L   CO2 27 22 - 32 mmol/L   Glucose, Bld 126 (H) 70 - 99 mg/dL   BUN 25 (H) 8 - 23 mg/dL   Creatinine, Ser 1.04 (H) 0.44 - 1.00 mg/dL   Calcium 8.5 (L) 8.9 - 10.3 mg/dL   GFR calc non Af Amer 57 (L) >60 mL/min   GFR calc Af Amer >60 >60 mL/min   Anion gap 9 5 - 15  CBC  Result Value Ref Range   WBC 7.4 4.0 - 10.5  K/uL   RBC 4.28 3.87 - 5.11 MIL/uL   Hemoglobin 13.8 12.0 - 15.0 g/dL   HCT 40.1 36 - 46 %   MCV 93.7 80.0 - 100.0 fL   MCH 32.2 26.0 - 34.0 pg   MCHC 34.4 30.0 - 36.0 g/dL   RDW 12.8 11.5 - 15.5 %   Platelets 343 150 - 400 K/uL   nRBC 0.0 0.0 - 0.2 %  Troponin I (High Sensitivity)  Result Value Ref Range   Troponin I (High Sensitivity) 4 <18 ng/L       Assessment & Plan:   1. Encounter for screening mammogram for malignant neoplasm of breast  - MM 3D SCREEN BREAST BILATERAL; Future  2. Chest pain, unspecified type She denied chest pain here in the recent past, and has had a couple episodes prompting presentations to the emergency room as noted above. She continues to follow-up with cardiology, with work-up continuing.  3. Coronary artery disease involving native coronary artery of native heart with angina pectoris ([place]) As above, continuing to follow with cardiology  4. Hypertension goal BP (blood pressure) < 140/90 Blood pressure reasonable today, continues to follow with cardiology to help as well.  5. Mixed hyperlipidemia Was started on an injectable medicine to help as she has an allergy to statin products. Continue with cardiology follow-up.  6. Statin intolerance As noted above  7. Osteoarthritis of multiple joints, unspecified osteoarthritis type Continues to follow with rheumatology.  8. Prediabetes Noted we do need to check an A1c again soon, and she did note she has had a lot of blood test done recently, especially with some emergency room visits. Agreed to not have to check, although would like to check before the end of the year at the latest, and will do so on a follow-up visit.  9. Class 2 severe obesity due to excess calories with serious comorbidity and body mass index (BMI) of 36.0 to 36.9 in adult [facility]) Weight today about the same as it was in very early August.  She has not had much luck trying to successfully lose weight in her past.  10. Anxiety  and depression Do agree that her anxiety is probably contributing to some symptoms in the past, and is something that needs to be better managed. She did touch base with psychiatry yesterday, and will continue to follow with them. Discussed the potential of medicines that may be helpful in addition, and she will continue to follow-up with psychiatry in this realm. She had a very positive PHQ-9, although denied major depressive symptoms when discussed, and states that she is more stressed and anxious than depressed.  11. Need for influenza vaccination Flu shot given today.   We will tentatively schedule follow-up in 3 months time, sooner as needed. Will need labs checked at that time including an A1c as part of that lab panel. Continue to follow-up with cardiology in the short-term.  Towanda Malkin, MD 01/05/20 12:12 PM

## 2020-01-04 NOTE — Telephone Encounter (Signed)
    MA9/22/2021 1st Attempt  Name: Linda Mejia   MRN: [id number]   DOB: 1956-01-09   AGE: 64 y.o.   GENDER: female   PCP Towanda Malkin, MD.   01/04/20 Spoke with patient she does not need counseling services right now. She is continuing to check with the manager at [facility] and is searching the [employer] for housing.  No other resources are needed at this time. She will call if any further assistance is needed. Closing referral.    Boyce Keltner, AAS Paralegal, [place] . Embedded Care Coordination [facility]  [facility] Management  [address],  [address] [email or web address]  [phone]   [email or web address]

## 2020-01-05 ENCOUNTER — Other Ambulatory Visit: Payer: Self-pay | Admitting: Internal Medicine

## 2020-01-05 ENCOUNTER — Encounter: Payer: Self-pay | Admitting: Internal Medicine

## 2020-01-05 ENCOUNTER — Ambulatory Visit (INDEPENDENT_AMBULATORY_CARE_PROVIDER_SITE_OTHER): Payer: Medicare Other | Admitting: Internal Medicine

## 2020-01-05 ENCOUNTER — Other Ambulatory Visit: Payer: Self-pay

## 2020-01-05 VITALS — BP 138/88 | HR 83 | Temp 97.9°F | Resp 16 | Ht 65.0 in | Wt 216.4 lb

## 2020-01-05 DIAGNOSIS — I10 Essential (primary) hypertension: Secondary | ICD-10-CM | POA: Diagnosis not present

## 2020-01-05 DIAGNOSIS — F32A Depression, unspecified: Secondary | ICD-10-CM

## 2020-01-05 DIAGNOSIS — M159 Polyosteoarthritis, unspecified: Secondary | ICD-10-CM

## 2020-01-05 DIAGNOSIS — F419 Anxiety disorder, unspecified: Secondary | ICD-10-CM

## 2020-01-05 DIAGNOSIS — R079 Chest pain, unspecified: Secondary | ICD-10-CM

## 2020-01-05 DIAGNOSIS — R7303 Prediabetes: Secondary | ICD-10-CM

## 2020-01-05 DIAGNOSIS — Z1231 Encounter for screening mammogram for malignant neoplasm of breast: Secondary | ICD-10-CM

## 2020-01-05 DIAGNOSIS — E66812 Obesity, class 2: Secondary | ICD-10-CM

## 2020-01-05 DIAGNOSIS — E782 Mixed hyperlipidemia: Secondary | ICD-10-CM

## 2020-01-05 DIAGNOSIS — I25119 Atherosclerotic heart disease of native coronary artery with unspecified angina pectoris: Secondary | ICD-10-CM | POA: Diagnosis not present

## 2020-01-05 DIAGNOSIS — Z23 Encounter for immunization: Secondary | ICD-10-CM

## 2020-01-05 DIAGNOSIS — Z6836 Body mass index (BMI) 36.0-36.9, adult: Secondary | ICD-10-CM

## 2020-01-05 DIAGNOSIS — Z789 Other specified health status: Secondary | ICD-10-CM

## 2020-01-05 DIAGNOSIS — F329 Major depressive disorder, single episode, unspecified: Secondary | ICD-10-CM

## 2020-01-09 ENCOUNTER — Ambulatory Visit: Payer: Self-pay | Admitting: Internal Medicine

## 2020-01-09 ENCOUNTER — Other Ambulatory Visit: Payer: Self-pay

## 2020-01-09 ENCOUNTER — Encounter: Payer: Self-pay | Admitting: Internal Medicine

## 2020-01-09 MED ORDER — NITROGLYCERIN 0.4 MG SL SUBL: 0.4000 mg | SUBLINGUAL_TABLET | SUBLINGUAL | 0 refills | Status: DC | PRN

## 2020-01-27 ENCOUNTER — Other Ambulatory Visit: Payer: Self-pay

## 2020-01-27 MED ORDER — PANTOPRAZOLE SODIUM 40 MG PO TBEC
40.0000 mg | DELAYED_RELEASE_TABLET | Freq: Every day | ORAL | 0 refills | Status: DC
Start: 2020-01-27 — End: 2020-05-08

## 2020-01-30 ENCOUNTER — Encounter: Payer: Self-pay | Admitting: Emergency Medicine

## 2020-01-30 ENCOUNTER — Telehealth: Payer: Self-pay | Admitting: Gastroenterology

## 2020-01-30 ENCOUNTER — Emergency Department
Admission: EM | Admit: 2020-01-30 | Discharge: 2020-01-30 | Disposition: A | Discharge status: Home / Self Care | Payer: Medicare Other | Attending: Emergency Medicine | Admitting: Emergency Medicine

## 2020-01-30 ENCOUNTER — Other Ambulatory Visit: Payer: Self-pay

## 2020-01-30 DIAGNOSIS — Z7982 Long term (current) use of aspirin: Secondary | ICD-10-CM | POA: Insufficient documentation

## 2020-01-30 DIAGNOSIS — R000 Tachycardia, unspecified: Secondary | ICD-10-CM

## 2020-01-30 DIAGNOSIS — K2900 Acute gastritis without bleeding: Secondary | ICD-10-CM

## 2020-01-30 DIAGNOSIS — K2960 Other gastritis without bleeding: Secondary | ICD-10-CM | POA: Insufficient documentation

## 2020-01-30 DIAGNOSIS — Z87891 Personal history of nicotine dependence: Secondary | ICD-10-CM | POA: Diagnosis not present

## 2020-01-30 DIAGNOSIS — R197 Diarrhea, unspecified: Secondary | ICD-10-CM | POA: Diagnosis not present

## 2020-01-30 DIAGNOSIS — Z79899 Other long term (current) drug therapy: Secondary | ICD-10-CM | POA: Insufficient documentation

## 2020-01-30 DIAGNOSIS — R109 Unspecified abdominal pain: Secondary | ICD-10-CM | POA: Diagnosis present

## 2020-01-30 DIAGNOSIS — I25119 Atherosclerotic heart disease of native coronary artery with unspecified angina pectoris: Secondary | ICD-10-CM | POA: Diagnosis not present

## 2020-01-30 DIAGNOSIS — K921 Melena: Secondary | ICD-10-CM

## 2020-01-30 LAB — COMPREHENSIVE METABOLIC PANEL
ALT: 19 U/L (ref 0–44)
AST: 26 U/L (ref 15–41)
Albumin: 4.5 g/dL (ref 3.5–5.0)
Alkaline Phosphatase: 62 U/L (ref 38–126)
Anion gap: 11 (ref 5–15)
BUN: 12 mg/dL (ref 8–23)
CO2: 26 mmol/L (ref 22–32)
Calcium: 9.7 mg/dL (ref 8.9–10.3)
Chloride: 101 mmol/L (ref 98–111)
Creatinine, Ser: 0.87 mg/dL (ref 0.44–1.00)
GFR, Estimated: >60 mL/min (ref >60)
Glucose, Bld: 103 mg/dL — ABNORMAL HIGH (ref 70–99)
Potassium: 3.9 mmol/L (ref 3.5–5.1)
Sodium: 138 mmol/L (ref 135–145)
Total Bilirubin: 0.7 mg/dL (ref 0.3–1.2)
Total Protein: 7.8 g/dL (ref 6.5–8.1)

## 2020-01-30 LAB — CBC
HCT: 41.9 % (ref 36.0–46.0)
Hemoglobin: 14.1 g/dL (ref 12.0–15.0)
MCH: 32 pg (ref 26.0–34.0)
MCHC: 33.7 g/dL (ref 30.0–36.0)
MCV: 95.2 fL (ref 80.0–100.0)
Platelets: 376 10*3/uL (ref 150–400)
RBC: 4.4 MIL/uL (ref 3.87–5.11)
RDW: 13.2 % (ref 11.5–15.5)
WBC: 7.3 10*3/uL (ref 4.0–10.5)
nRBC: 0 % (ref 0.0–0.2)

## 2020-01-30 LAB — SAMPLE TO BLOOD BANK

## 2020-01-30 LAB — LIPASE, BLOOD: Lipase: 35 U/L (ref 11–51)

## 2020-01-30 MED ORDER — PANTOPRAZOLE SODIUM 40 MG PO TBEC: 40.0000 mg | DELAYED_RELEASE_TABLET | Freq: Every day | ORAL | 1 refills | Status: DC

## 2020-01-30 MED ORDER — ALUM & MAG HYDROXIDE-SIMETH 200-200-20 MG/5ML PO SUSP
30.0000 mL | Freq: Once | ORAL | Status: AC
  Administered 2020-01-30: 30 mL via ORAL
  Filled 2020-01-30: qty 30

## 2020-01-30 MED ORDER — LIDOCAINE VISCOUS HCL 2 % MT SOLN
15.0000 mL | Freq: Once | OROMUCOSAL | Status: AC
  Administered 2020-01-30: 15 mL via ORAL
  Filled 2020-01-30: qty 15

## 2020-01-30 MED ORDER — PANTOPRAZOLE SODIUM 40 MG PO TBEC
40.0000 mg | DELAYED_RELEASE_TABLET | Freq: Once | ORAL | Status: AC
  Administered 2020-01-30: 40 mg via ORAL
  Filled 2020-01-30: qty 1

## 2020-01-30 NOTE — Telephone Encounter (Signed)
Patient wants to know how soon she can be seen. She's experiencing a burning sensation in her abdomin and vomiting. Per patient.

## 2020-01-30 NOTE — ED Provider Notes (Signed)
[facility] Emergency Department Provider Note ____________________________________________   First MD Initiated Contact with Patient 01/30/20 1647     (approximate)  I have reviewed the triage vital signs and the nursing notes.  HISTORY  Chief Complaint Abdominal Pain, Emesis, and Diarrhea   HPI Linda Mejia is a 64 y.o. femalewho presents to the ED for evaluation of abdominal pain, emesis and diarrhea.  Chart review indicates telephone encounter with her GI physician this morning, establishing appointment to be seen on November 10 as an outpatient. 10/2019 EGD with gastric erythema    Patient reports 1 week of nausea, vomiting and diarrhea that resolved in the past 1-2 days.  She reports taking many OTC medications such as Tums, Imodium, Tylenol helps her symptoms.  She reports her diarrhea improving, as she has not had a bowel movement in 1 day now.  She reports that she has had melena for the past 3-4 days, she shows me multiple pictures on her phone of her stool in the toilet bowl.  1 of these is certainly melanotic, and the most recent 1 was more brown in color.  No hematochezia noted.  She reports abdominal cramping that has been constant over the past 1 week, and improving.  Currently 1/10.  She reports feeling tired and is very anxious, requesting to leave before even see her.  She reports feeling better and that she only came here because her daughter was worried that her stomach was bleeding.  She agrees to take oral medications of a GI cocktail, Protonix and a p.o. challenge as she reports being hungry at this time.  She indicates that she does not want to stay for a CT scan or any further testing because "my anxiety is killing me I just need to get out of here."  Past Medical History:  Diagnosis Date  . Abnormal ankle brachial index (ABI) 12/02/2016  . ADHD (attention deficit hyperactivity disorder)   . AKI (acute kidney injury) ([place])   . Allergy 2020    Can't take amlopine. Causes swelling  . Anxiety   . Aortic atherosclerosis ([place]) 12/02/2016   July 2018  . Arthritis   . Chronic back pain   . Coronary artery disease   . Depression 2020   I think everyone is depressed at this unprecedented time!  Marland Kitchen GERD (gastroesophageal reflux disease)   . Herpes genitalis in women   . History of stomach ulcers    x7 this year. Bleeding  . HLD (hyperlipidemia)   . HYPERLIPIDEMIA-MIXED   . Hypertension   . Prediabetes 12/02/2016  . Pulmonary hypertension ([place]) 03/09/2018   Echo Oct 2019  . 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preiser), left ([place])   . Subclinical hypothyroidism   . Subclinical hypothyroidism   . Ulcer     Patient Active Problem List   Diagnosis Date Noted  . Osteoarthritis of multiple joints 01/05/2020  . Anxiety and depression 11/14/2019  . Hypoxia 11/04/2019  . Abdominal distension   . Acute respiratory failure with hypoxia ([place]) 11/03/2019  . Lactic acidosis 11/03/2019  . AKI (acute kidney injury) ([place]) 11/03/2019  . Malaise 11/03/2019  . Cellulitis 01/28/2019  . 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 ANA positive 09/22/2018  . 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preiser), left ([place]) 09/22/2018  . Tenosynovitis of hand 09/22/2018  . Breast mass, right 06/14/2018  . Acute peptic ulcer of stomach   . Stomach irritation   . Abdominal pain, epigastric   .  Acute gastric ulcer due to Helicobacter pylori   . Pulmonary hypertension ([place]) 03/09/2018  . Unstable angina ([place]) 01/23/2018  . Medication monitoring encounter 03/18/2017  . Colon cancer screening 03/18/2017  . Coronary artery calcification seen on CT scan 12/02/2016  . Aortic atherosclerosis ([place]) 12/02/2016  . Abnormal ankle brachial index (ABI) 12/02/2016  . Prediabetes 12/02/2016  . Fracture of rib 08/28/2016  . Atypical chest pain 08/11/2016  . Hx of tobacco use, presenting hazards to health 06/09/2016  . Degenerative arthritis of knee,  bilateral 12/27/2015  . Class 2 severe obesity due to excess calories with serious comorbidity and body mass index (BMI) of 36.0 to 36.9 in adult ([place]) 12/27/2015  . GI bleed 09/21/2015  . Reflux esophagitis   . Nausea and vomiting   . Polyarthralgia 06/05/2015  . Night sweats 06/05/2015  . Headache 04/12/2015  . Subclinical hypothyroidism 03/26/2015  . Herpes simplex type 2 infection 03/21/2015  . Rhinitis, allergic 03/21/2015  . ADHD (attention deficit hyperactivity disorder), inattentive type 09/20/2014  . Episodic paroxysmal anxiety disorder 09/20/2014  . History of gastric ulcer 09/20/2014  . Substance abuse ([place]) 09/20/2014  . HLD (hyperlipidemia) 10/03/2009  . Coronary artery disease involving native coronary artery of native heart with angina pectoris ([place]) 10/02/2009  . Hypertension goal BP (blood pressure) < 140/90 10/02/2009    Past Surgical History:  Procedure Laterality Date  . APPENDECTOMY    . CARDIAC CATHETERIZATION    . CESAREAN SECTION     x2  . COLON SURGERY     interception  . CORONARY ANGIOPLASTY     stents...  2000  . ESOPHAGOGASTRODUODENOSCOPY (EGD) WITH PROPOFOL N/A 09/21/2015   Procedure: ESOPHAGOGASTRODUODENOSCOPY (EGD) WITH PROPOFOL;  Surgeon: Lucilla Lame, MD;  Location: [facility] ENDOSCOPY;  Service: Endoscopy;  Laterality: N/A;  . ESOPHAGOGASTRODUODENOSCOPY (EGD) WITH PROPOFOL N/A 07/30/2016   Procedure: ESOPHAGOGASTRODUODENOSCOPY (EGD) WITH PROPOFOL;  Surgeon: Jonathon Bellows, MD;  Location: [facility] ENDOSCOPY;  Service: Endoscopy;  Laterality: N/A;  . ESOPHAGOGASTRODUODENOSCOPY (EGD) WITH PROPOFOL N/A 04/23/2018   Procedure: ESOPHAGOGASTRODUODENOSCOPY (EGD) WITH PROPOFOL;  Surgeon: Virgel Manifold, MD;  Location: [facility] ENDOSCOPY;  Service: Endoscopy;  Laterality: N/A;  . ESOPHAGOGASTRODUODENOSCOPY (EGD) WITH PROPOFOL N/A 11/04/2019   Procedure: ESOPHAGOGASTRODUODENOSCOPY (EGD) WITH PROPOFOL;  Surgeon: Lin Landsman, MD;  Location: [facility] ENDOSCOPY;  Service:  Gastroenterology;  Laterality: N/A;  . EYE SURGERY  Dec 2021   Droopy eye lids  . LEFT HEART CATH Right 01/25/2018   Procedure: Left Heart Cath and Coronary Angiography;  Surgeon: Dionisio David, MD;  Location: [place] CV LAB;  Service: Cardiovascular;  Laterality: Right;  . LEFT HEART CATH AND CORONARY ANGIOGRAPHY Right 08/12/2016   Procedure: Left Heart Cath and Coronary Angiography;  Surgeon: Dionisio David, MD;  Location: [place] CV LAB;  Service: Cardiovascular;  Laterality: Right;  . SMALL INTESTINE SURGERY    . TONSILLECTOMY    . TUBAL LIGATION      Prior to Admission medications   Medication Sig Start Date End Date Taking? Authorizing Provider  acyclovir (ZOVIRAX) 400 MG tablet Take 1 tablet (400 mg total) by mouth 2 (two) times daily. 02/15/19   Hubbard Hartshorn, FNP  Alirocumab (PRALUENT) 75 MG/ML SOAJ Inject 1 each into the skin every 14 (fourteen) days. 12/09/18   Hubbard Hartshorn, FNP  amphetamine-dextroamphetamine (ADDERALL) 30 MG tablet Take 30 mg by mouth 2 (two) times daily.  12/02/16   Arnetha Courser, MD  aspirin EC 81 MG tablet Take 81 mg by  mouth daily.    [provider]  cholecalciferol (VITAMIN D3) 25 MCG (1000 UNIT) tablet Take 1,000 Units by mouth daily.    [provider]  cyanocobalamin 1000 MCG tablet Take 1,000 mcg by mouth daily.    [provider]  diazepam (VALIUM) 10 MG tablet Take 1 tablet (10 mg total) by mouth every 8 (eight) hours as needed for anxiety. 09/22/15   Fritzi Mandes, MD  fluticasone (FLONASE) 50 MCG/ACT nasal spray Place 2 sprays into both nostrils daily as needed for allergies. 12/12/19   Towanda Malkin, MD  metoprolol tartrate (LOPRESSOR) 50 MG tablet TAKE 1 TABLET BY MOUTH TWICE DAILY Patient taking differently: Take 50 mg by mouth 2 (two) times daily.  08/07/19   Hubbard Hartshorn, FNP  nitroGLYCERIN (NITROSTAT) 0.4 MG SL tablet Place 1 tablet (0.4 mg total) under the tongue every 5 (five) minutes as needed  for chest pain. 01/09/20   Towanda Malkin, MD  nystatin (MYCOSTATIN/NYSTOP) powder Apply topically 2 (two) times daily. Patient taking differently: Apply topically as needed (rash).  05/25/18   Dustin Flock, MD  pantoprazole (PROTONIX) 40 MG tablet Take 1 tablet (40 mg total) by mouth daily. 01/27/20   Virgel Manifold, MD  pantoprazole (PROTONIX) 40 MG tablet Take 1 tablet (40 mg total) by mouth daily. 01/30/20 01/29/21  Vladimir Crofts, MD  PROAIR RESPICLICK 026 [phone] Base) MCG/ACT AEPB Inhale 2 puffs into the lungs every 6 (six) hours as needed. 12/28/19   [provider]    Allergies Amlodipine and Statins  Family History  Problem Relation Age of Onset  . Diabetes Mother   . CVA Mother   . Heart disease Father   . Heart disease Sister   . Breast cancer Sister   . Heart disease Brother   . Heart disease Sister   . Heart disease Sister   . Heart disease Sister   . Heart disease Brother   . Heart disease Brother   . Heart disease Brother   . Heart disease Brother   . Healthy Daughter   . Healthy Daughter     Social History Social History   Tobacco Use  . Smoking status: Former Smoker    Packs/day: 1.00    Years: 30.00    Pack years: 30.00    Types: Cigarettes    Quit date: 2006    Years since quitting: 15.8  . Smokeless tobacco: Never Used  . Tobacco comment: I quit smoking cigarettes  Vaping Use  . Vaping Use: Never used  Substance Use Topics  . Alcohol use: Yes    Alcohol/week: 1.0 standard drink    Types: 1 Glasses of wine per week    Comment: occasionally   . Drug use: No    Review of Systems  Constitutional: No fever/chills Eyes: No visual changes. ENT: No sore throat. Cardiovascular: Denies chest pain. Respiratory: Denies shortness of breath. Gastrointestinal:  No constipation. Positive for abdominal pain, nausea and vomiting and diarrhea. Genitourinary: Negative for dysuria. Musculoskeletal: Negative for back pain. Skin:  Negative for rash. Neurological: Negative for headaches, focal weakness or numbness.  ____________________________________________   PHYSICAL EXAM:  VITAL SIGNS: Vitals:   01/30/20 1143 01/30/20 1829  BP: (!) 163/96 (!) 151/74  Pulse: (!) 113 92  Resp: 18 15  Temp: 98.4 F (36.9 C)   SpO2: 93% 95%      Constitutional: Alert and oriented.  Quite anxious.  Fidgeting in bed, frequently tapping her feet and asking when  she can leave. Eyes: Conjunctivae are normal. PERRL. EOMI. Head: Atraumatic. Nose: No congestion/rhinnorhea. Mouth/Throat: Mucous membranes are moist.  Oropharynx non-erythematous. Neck: No stridor. No cervical spine tenderness to palpation. Cardiovascular: Tachycardic rate, regular rhythm. Grossly normal heart sounds.  Good peripheral circulation. Upon reevaluation after medications and p.o. challenge, no longer tachycardic. Respiratory: Normal respiratory effort.  No retractions. Lungs CTAB. Gastrointestinal: Soft , nondistended, nontender to palpation. No abdominal bruits. No CVA tenderness.  Benign abdomen. Musculoskeletal: No lower extremity tenderness nor edema.  No joint effusions. No signs of acute trauma. Neurologic:  Normal speech and language. No gross focal neurologic deficits are appreciated. No gait instability noted. Skin:  Skin is warm, dry and intact. No rash noted. Psychiatric: Linear thought processes without suicidality.  Quite anxious.  ____________________________________________   LABS (all labs ordered are listed, but only abnormal results are displayed)  Labs Reviewed  COMPREHENSIVE METABOLIC PANEL - Abnormal; Notable for the following components:      Result Value   Glucose, Bld 103 (*)    All other components within normal limits  LIPASE, BLOOD  CBC  URINALYSIS, COMPLETE (UACMP) WITH MICROSCOPIC  SAMPLE TO BLOOD BANK   ____________________________________________  12 Lead EKG  Sinus rhythm, rate of 104 bpm.  Normal axis and  intervals.  No evidence of acute ischemia. ____________________________________________   PROCEDURES and INTERVENTIONS  Procedure(s) performed (including Critical Care):  Procedures  Medications  alum & mag hydroxide-simeth (MAALOX/MYLANTA) 200-200-20 MG/5ML suspension 30 mL (30 mLs Oral Given 01/30/20 1807)    And  lidocaine (XYLOCAINE) 2 % viscous mouth solution 15 mL (15 mLs Oral Given 01/30/20 1807)  pantoprazole (PROTONIX) EC tablet 40 mg (40 mg Oral Given 01/30/20 1808)    ____________________________________________   MDM / ED COURSE  64 year old woman with known gastritis presents anxious after a week of diarrhea, amenable to outpatient management with GI follow-up.  Patient tachycardic in triage, likely due to her anxiety, otherwise normal vital signs on room air.  Exam demonstrates a very anxious patient who has a benign abdomen, linear thought processes and no emergent psychiatric symptoms.  Her blood work is reassuring without decrease in her H/H.  Patient reports resolution of symptoms after GI cocktail and p.o. Protonix.  I discussed with the patient the possibility of coexisting intra-abdominal pathology beyond gastritis, and is missing this with not performing CT imaging of her abdomen/pelvis.  She acknowledges this and requests discharge.  She has follow-up with GI in the next 3 weeks.  We discussed return precautions for the ED.  Patient medically stable discharge home.  Clinical Course as of Jan 29 1841  Mon Jan 30, 2020  1739 Patient just roomed in a hallway bed after 6.5hr wait. She is requesting dishcarge at this time before my initial evaluation.     [DS]  1937 Patient reports feeling much better and is requesting discharge.   [DS]    Clinical Course User Index [DS] Vladimir Crofts, MD     ____________________________________________   FINAL CLINICAL IMPRESSION(S) / ED DIAGNOSES  Final diagnoses:  Other acute gastritis without hemorrhage  Melena  Sinus  tachycardia     ED Discharge Orders         Ordered    pantoprazole (PROTONIX) 40 MG tablet  Daily        01/30/20 1822           Khaleah Duer Tamala Julian   Note:  This document was prepared using Dragon voice recognition software and may include unintentional dictation errors.  Vladimir Crofts, MD 01/30/20 [phone]

## 2020-01-30 NOTE — Telephone Encounter (Signed)
Called patient back and she di not answer. I will also send her a MyChart message letting her know what to do and when we could see her.

## 2020-01-30 NOTE — Discharge Instructions (Signed)
Please follow-up with your GI physician as we discussed on the November 10 appointment.  Please pick up the prescription for Protonix and start taking this daily until you see them.  Return to the ED with any worsening symptoms

## 2020-01-30 NOTE — ED Triage Notes (Signed)
Pt ambulatory to triage with c/o n/v/d since 10/9.  Also states burning abdominal pain.  PT states hx of bleeding ulcers and feels similar.  Pt reports dizziness and feeling weak.  Pt reports emesis is coffee ground and she has dark tarry stools.

## 2020-01-30 NOTE — ED Notes (Signed)
[place] and a type and screen sent to the lab.

## 2020-02-20 ENCOUNTER — Other Ambulatory Visit: Payer: Self-pay

## 2020-02-22 ENCOUNTER — Ambulatory Visit: Payer: Medicare Other | Admitting: Gastroenterology

## 2020-03-13 ENCOUNTER — Other Ambulatory Visit: Payer: Self-pay | Admitting: Internal Medicine

## 2020-03-13 DIAGNOSIS — I25119 Atherosclerotic heart disease of native coronary artery with unspecified angina pectoris: Secondary | ICD-10-CM

## 2020-03-13 DIAGNOSIS — I10 Essential (primary) hypertension: Secondary | ICD-10-CM

## 2020-03-13 MED ORDER — METOPROLOL TARTRATE 50 MG PO TABS: 50.0000 mg | ORAL_TABLET | Freq: Two times a day (BID) | ORAL | 5 refills | Status: DC

## 2020-03-13 NOTE — Telephone Encounter (Signed)
Pt request refill  metoprolol tartrate (LOPRESSOR) 50 MG tablet  90 day supply  [id number] [address], [place] Phone:  [phone]  Fax:  [phone]     Pt did go to the pharmacy, but he was rude.

## 2020-03-29 NOTE — Progress Notes (Deleted)
Patient is a 64 year old female Last visit with me was 01/05/2020  Follows up today   A screening mammogram was ordered on that last visit and not yet obtained.  She was seen in the emergency department 01/30/2020 with the following noted:  64 year old woman with known gastritis presents anxious after a week of diarrhea, amenable to outpatient management with GI follow-up.  Patient tachycardic in triage, likely due to her anxiety, otherwise normal vital signs on room air.  Exam demonstrates a very anxious patient who has a benign abdomen, linear thought processes and no emergent psychiatric symptoms.  Her blood work is reassuring without decrease in her H/H.  Patient reports resolution of symptoms after GI cocktail and p.o. Protonix.  I discussed with the patient the possibility of coexisting intra-abdominal pathology beyond gastritis, and is missing this with not performing CT imaging of her abdomen/pelvis.  She acknowledges this and requests discharge.  She has follow-up with GI in the next 3 weeks.  We discussed return precautions for the ED.  Patient medically stable discharge home.   Chest pains -had been evaluated on a prior hospitalization before her visit with me 11/18/2019 with no acute cardiac source noted, CT scans were done as well as GI being involved with procedures done with no obvious source identified.  She has remained on a PPI which helps with reflux symptoms.  She was last seen by cardiology 02/14/2020 after having a pharmacological stress test and echocardiogram on 02/08/2020 with no marked findings of concern.  64 y.o. female with  1. Atypical chest pain  2. Need for vaccination  3. Angina at rest (CMS-HCC)  4. SOB (shortness of breath)  5. Tachycardia  6. Heart palpitations  7. Essential hypertension  8. Fatigue, unspecified type  9. Hypoxemia  10. PMR (polymyalgia rheumatica) (CMS-HCC)  11. Class 2 obesity due to excess calories without serious comorbidity with body mass  index (BMI) of 37.0 to 37.9 in adult   Plan  1 atypical chest pain continue current medical therapy 2 fatigue generalized recommend routine aerobic exercise and rest 3 obesity recommend weight loss exercise portion control 4 asthma mild recurrent continue inhalers as necessary for dyspneic symptoms 5 GERD by history patient maintained on Protonix therapy for reflux symptoms 6 shortness of breath probably related to asthma continue medical therapy including inhalers 7 PMR chronic stable follow-up with otology as necessary 8 depression mild continue Celexa 20 mg daily 9 have the patient follow-up in 2 weeks  Return in about 2 weeks (around 02/28/2020) -she did not follow-up for that visit.  CAD - The patient had a cardiac catheterization most recently in 2019 showing stable lesions with an occluded RCA but good collaterals  HTN: amlodipine was stopped previously due to swelling in her legs, now taking metoprolol 50 mg twice daily without issues; Not check her pressures at home BP good last visit with cardiology BP Readings from Last 3 Encounters:  01/30/20 (!) 151/74  01/05/20 138/88  12/12/19 (!) 144/74    Denies significant increased chest pains, no increased shortness of breath, no increased lower extremity swelling, +  Headaches recently and tylenol helps,   +ANA/Polyarthralgia/osteoarthritis/malaise: Followed by rheumatology. Last visit with Dr. Jefm Bryant was 02/20/2020 with the following assessment/plan:  Assessment: Osteoarthritis  - shoulders -Lumbar disc disease with spondylolisthesis. May be culprit in her pelvic pain -Degenerative change in the thoracic spine with possible prior compression fracture. Prior injury with rib fractures.  Right olecranon bursitis  Osteopenia on bone density Not enough evidence  of inflammatory disease Anxiety Recent chest pain History of gastritis  Plan: Procedure: Left shoulder prepped in a sterile manner, injected with  2ccs. Xylocaine, 2ccs. Bupivacaine .25 % , 1 cc. Kenalog-40. Shoulder exercises Voltaren gel to the elbow. Report if it swells Trial of meloxicam 7.5 mg See back 3 to 4 months Physical therapy for her back Reviewed films and exam with her daughter and patient    Obesity/Prediabetes: Wt Readings from Last 3 Encounters:  01/30/20 220 lb (99.8 kg)  01/05/20 216 lb 6.4 oz (98.2 kg)  12/12/19 198 lb (89.8 kg)    Lab Results  Component Value Date   HGBA1C 6.1 (H) 12/06/2018   HGBA1C 5.8 (H) 01/24/2018   HGBA1C 5.8 (H) 03/18/2017   Lab Results  Component Value Date   LDLCALC 229 (H) 12/06/2018   CREATININE 0.87 01/30/2020     HLD:  allergic to statinsnoted, seeing cardiology  Med regimen - Praluent injection every 2 weeks Lab Results  Component Value Date   CHOL 332 (H) 12/06/2018   HDL 58 12/06/2018   LDLCALC 229 (H) 12/06/2018   TRIG 250 (H) 12/06/2018   CHOLHDL 5.7 (H) 12/06/2018    Anxiety/depression concerns She notes her stresses have been high recently, and thinks that is a factor in her physical symptoms at times.  She has seen psychiatry in the past, and had a televisit with psychiatry the day before our last visit noted.   She noted she felt better after talking with them again with an in person follow-up  scheduled after that televisit.    No medications were added to her regimen after that visit yesterday. She denied major depressive concerns after reviewing the PHQ-9 with her today which was very positive, and states " I am not depressed, just stressed".   NAD, masked, mildly anxious appearing HEENT - Page/AT, sclera anicteric, PERRL, EOMI, conj - non-inj'ed, pharynx clear Neck - supple, no adenopathy,  Car - RRR without m/g/r Pulm- RR and effort normal at rest, CTA without wheeze or rales Abd - soft,obese,diffusely nontender,  Back - no CVA tenderness, Ext - no LE edema,  Neuro/psychiatric - affect was not flat, appropriate with  conversation Alert  Grossly non-focal  Speech normal   1.  Screening mammogram Was ordered last visit and not yet obtained  2. Chest pain, unspecified type She continues to follow-up with cardiology, with work-up as outlined above. She missed the last follow-up with them that was scheduled.  3. Coronary artery disease involving native coronary artery of native heart with angina pectoris ([place]) As above, continuing to follow with cardiology  4. Hypertension goal BP (blood pressure) < 140/90 Blood pressure reasonable today, continues to follow with cardiology to help as well.  5. Mixed hyperlipidemia Was started on an injectable medicine by cardiology to help as she has an allergy to statin products. Continue with cardiology follow-up.  6. Statin intolerance As noted above  7. Osteoarthritis of multiple joints, unspecified osteoarthritis type Continues to follow with rheumatology.  8. Prediabetes We will recheck an A1c  9. Class 2 severe obesity due to excess calories with serious comorbidity and body mass index (BMI) of 36.0 to 36.9 in adult [facility]) Concerned with some recent increased weight, and discussed the importance of healthy weight maintenance in the future.  10. Anxiety and depression Do agree that her anxiety is probably contributing to some symptoms in the past, and is something that needs to be better managed. She did touch base with psychiatry yesterday,  and will continue to follow with them. Discussed the potential of medicines that may be helpful in addition, and she will continue to follow-up with psychiatry in this realm. She had a very positive PHQ-9, although denied major depressive symptoms when discussed, and states that she is more stressed and anxious than depressed.    Follow-up again in 3 months time, sooner as needed Mutually agreed that since I was unable to get the A1c via fingerstick today in the  office (told out of strips to do so), rechecking labs again on her next follow-up visit, and told to come fasting at that visit to do so.  She is aware that the follow-up will be with a different provider as I will be leaving the practice prior to that planned follow-up

## 2020-03-30 ENCOUNTER — Ambulatory Visit: Payer: Medicare Other | Admitting: Internal Medicine

## 2020-04-20 ENCOUNTER — Other Ambulatory Visit: Payer: Self-pay | Admitting: Internal Medicine

## 2020-04-20 MED ORDER — ACYCLOVIR 400 MG PO TABS: 400.0000 mg | ORAL_TABLET | Freq: Two times a day (BID) | ORAL | 5 refills | Status: AC

## 2020-04-20 NOTE — Telephone Encounter (Signed)
Medication Refill - Medication: acyclovir (ZOVIRAX) 400 MG tablet     Preferred Pharmacy (with phone number or street name):  [id number] [address], [place] [facility] Phone:  [phone]  Fax:  [phone]       Agent: Please be advised that RX refills may take up to 3 business days. We ask that you follow-up with your pharmacy.

## 2020-05-07 NOTE — Progress Notes (Signed)
Name: Linda Mejia   MRN: [id number]    DOB: Apr 23, 1955   Date:05/08/2020       Progress Note  Subjective  Chief Complaint  Acute: Follow up from recent fall  HPI  Right knee pain: she has a history of OA , also second fall in the past 3 months. . She states first fall was going over a baby gate , second fall , was three days ago, ground had snow , she picked up a stick and slipped in the snowed and fell face forward, hit her forehead and right knee. She has noticed numbness and effusion on right knee. She sees Dr. Jefm Bryant ( her Rheumatologist ) and also Dr. Grandville Silos ( Ortho - [facility] ).   She has a history of GI bleed and GERD, needs to follow up with Dr. Bonna Gains, she is out of Pantoprazole and I will send one month supply since she will be taking ibuprofen   Patient Active Problem List   Diagnosis Date Noted  . Osteoarthritis of multiple joints 01/05/2020  . Anxiety and depression 11/14/2019  . Hypoxia 11/04/2019  . Abdominal distension   . Acute respiratory failure with hypoxia ([place]) 11/03/2019  . Lactic acidosis 11/03/2019  . AKI (acute kidney injury) ([place]) 11/03/2019  . Malaise 11/03/2019  . Cellulitis 01/28/2019  . 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 ANA positive 09/22/2018  . 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preiser), left ([place]) 09/22/2018  . Tenosynovitis of hand 09/22/2018  . Breast mass, right 06/14/2018  . Acute peptic ulcer of stomach   . Stomach irritation   . Abdominal pain, epigastric   . Acute gastric ulcer due to Helicobacter pylori   . Pulmonary hypertension ([place]) 03/09/2018  . Unstable angina ([place]) 01/23/2018  . Medication monitoring encounter 03/18/2017  . Colon cancer screening 03/18/2017  . Coronary artery calcification seen on CT scan 12/02/2016  . Aortic atherosclerosis ([place]) 12/02/2016  . Abnormal ankle brachial index (ABI) 12/02/2016  . Prediabetes 12/02/2016  . Fracture of rib 08/28/2016  . Atypical chest pain  08/11/2016  . Hx of tobacco use, presenting hazards to health 06/09/2016  . Degenerative arthritis of knee, bilateral 12/27/2015  . Class 2 severe obesity due to excess calories with serious comorbidity and body mass index (BMI) of 36.0 to 36.9 in adult ([place]) 12/27/2015  . GI bleed 09/21/2015  . Reflux esophagitis   . Nausea and vomiting   . Polyarthralgia 06/05/2015  . Night sweats 06/05/2015  . Headache 04/12/2015  . Subclinical hypothyroidism 03/26/2015  . Herpes simplex type 2 infection 03/21/2015  . Rhinitis, allergic 03/21/2015  . ADHD (attention deficit hyperactivity disorder), inattentive type 09/20/2014  . Episodic paroxysmal anxiety disorder 09/20/2014  . History of gastric ulcer 09/20/2014  . Substance abuse ([place]) 09/20/2014  . HLD (hyperlipidemia) 10/03/2009  . Coronary artery disease involving native coronary artery of native heart with angina pectoris ([place]) 10/02/2009  . Hypertension goal BP (blood pressure) < 140/90 10/02/2009    Past Surgical History:  Procedure Laterality Date  . APPENDECTOMY    . CARDIAC CATHETERIZATION    . CESAREAN SECTION     x2  . COLON SURGERY     interception  . CORONARY ANGIOPLASTY     stents...  2000  . ESOPHAGOGASTRODUODENOSCOPY (EGD) WITH PROPOFOL N/A 09/21/2015   Procedure: ESOPHAGOGASTRODUODENOSCOPY (EGD) WITH PROPOFOL;  Surgeon: Lucilla Lame, MD;  Location: [facility] ENDOSCOPY;  Service: Endoscopy;  Laterality: N/A;  . ESOPHAGOGASTRODUODENOSCOPY (EGD) WITH PROPOFOL N/A  07/30/2016   Procedure: ESOPHAGOGASTRODUODENOSCOPY (EGD) WITH PROPOFOL;  Surgeon: Jonathon Bellows, MD;  Location: [facility] ENDOSCOPY;  Service: Endoscopy;  Laterality: N/A;  . ESOPHAGOGASTRODUODENOSCOPY (EGD) WITH PROPOFOL N/A 04/23/2018   Procedure: ESOPHAGOGASTRODUODENOSCOPY (EGD) WITH PROPOFOL;  Surgeon: Virgel Manifold, MD;  Location: [facility] ENDOSCOPY;  Service: Endoscopy;  Laterality: N/A;  . ESOPHAGOGASTRODUODENOSCOPY (EGD) WITH PROPOFOL N/A 11/04/2019   Procedure:  ESOPHAGOGASTRODUODENOSCOPY (EGD) WITH PROPOFOL;  Surgeon: Lin Landsman, MD;  [facility] END[facility]COPY;  Service: Gastroenterology;  Laterality: N/A;  . EYE SURGERY  Dec 2021   Droopy eye lids  . LEFT HEART CATH Right 01/25/2018   Procedure: Left Heart Cath and Coronary Angiography;  Surgeon: Dionisio David, MD;  Location: [place] CV LAB;  Service: Cardiovascular;  Laterality: Right;  . LEFT HEART CATH AND CORONARY ANGIOGRAPHY Right 08/12/2016   Procedure: Left Heart Cath and Coronary Angiography;  Surgeon: Dionisio David, MD;  Location: [place] CV LAB;  Service: Cardiovascular;  Laterality: Right;  . SMALL INTESTINE SURGERY    . TONSILLECTOMY    . TUBAL LIGATION      Family History  Problem Relation Age of Onset  . Diabetes Mother   . CVA Mother   . Heart disease Father   . Heart disease Sister   . Breast cancer Sister   . Heart disease Brother   . Heart disease Sister   . Heart disease Sister   . Heart disease Sister   . Heart disease Brother   . Heart disease Brother   . Heart disease Brother   . Heart disease Brother   . Healthy Daughter   . Healthy Daughter     Social History   Tobacco Use  . Smoking status: Former Smoker    Packs/day: 1.00    Years: 30.00    Pack years: 30.00    Types: Cigarettes    Quit date: 2006    Years since quitting: 16.0  . Smokeless tobacco: Never Used  . Tobacco comment: I quit smoking cigarettes  Substance Use Topics  . Alcohol use: Yes    Alcohol/week: 1.0 standard drink    Types: 1 Glasses of wine per week    Comment: occasionally      Current Outpatient Medications:  .  acyclovir (ZOVIRAX) 400 MG tablet, Take 1 tablet (400 mg total) by mouth 2 (two) times daily., Disp: 60 tablet, Rfl: 5 .  Alirocumab (PRALUENT) 75 MG/ML SOAJ, Inject 1 each into the skin every 14 (fourteen) days., Disp: 6 pen, Rfl: 3 .  amphetamine-dextroamphetamine (ADDERALL) 30 MG tablet, Take 30 mg by mouth 2 (two) times daily. , Disp: , Rfl:   .  aspirin EC 81 MG tablet, Take 81 mg by mouth daily., Disp: , Rfl:  .  cholecalciferol (VITAMIN D3) 25 MCG (1000 UNIT) tablet, Take 1,000 Units by mouth daily., Disp: , Rfl:  .  cyanocobalamin 1000 MCG tablet, Take 1,000 mcg by mouth daily., Disp: , Rfl:  .  diazepam (VALIUM) 10 MG tablet, Take 1 tablet (10 mg total) by mouth every 8 (eight) hours as needed for anxiety., Disp: 30 tablet, Rfl: 0 .  fluticasone (FLONASE) 50 MCG/ACT nasal spray, Place 2 sprays into both nostrils daily as needed for allergies., Disp: 16 g, Rfl: 11 .  metoprolol tartrate (LOPRESSOR) 50 MG tablet, Take 1 tablet (50 mg total) by mouth 2 (two) times daily., Disp: 60 tablet, Rfl: 5 .  nystatin (MYCOSTATIN/NYSTOP) powder, Apply topically 2 (two) times daily. (Patient taking differently: Apply  topically as needed (rash).), Disp: 15 g, Rfl: 0 .  pantoprazole (PROTONIX) 40 MG tablet, Take 1 tablet (40 mg total) by mouth daily., Disp: 30 tablet, Rfl: 0 .  Albuterol Sulfate (PROAIR RESPICLICK) 852 (90 Base) MCG/ACT AEPB, Inhale 2 puffs into the lungs every 6 (six) hours. (Patient not taking: Reported on 05/08/2020), Disp: , Rfl:  .  citalopram (CELEXA) 20 MG tablet, Take 1 tablet by mouth daily. (Patient not taking: Reported on 05/08/2020), Disp: , Rfl:  .  meloxicam (MOBIC) 7.5 MG tablet, Take 1 tablet by mouth daily. (Patient not taking: Reported on 05/08/2020), Disp: , Rfl:  .  pantoprazole (PROTONIX) 40 MG tablet, Take 1 tablet (40 mg total) by mouth daily. (Patient not taking: Reported on 05/08/2020), Disp: 30 tablet, Rfl: 1  Allergies  Allergen Reactions  . Amlodipine     Leg swelling  . Statins Other (See Comments)    I personally reviewed active problem list, medication list, allergies, family history, social history, health maintenance with the patient/caregiver today.   ROS  Constitutional: Negative for fever or weight change.  Respiratory: Negative for cough and shortness of breath.   Cardiovascular: Negative  for chest pain or palpitations.  Gastrointestinal: Negative for abdominal pain, no bowel changes.  Musculoskeletal: positive for gait problem , positive for  joint swelling.  Skin: positive for under breast rash  Neurological: Negative for dizziness or headache.  No other specific complaints in a complete review of systems (except as listed in HPI above).  Objective  Vitals:   05/08/20 0745  BP: 138/88  Pulse: 76  Resp: 16  Temp: 98 F (36.7 C)  TempSrc: Oral  SpO2: 96%  Weight: 227 lb 6.4 oz (103.1 kg)  Height: 5\' 5"  (1.651 m)    Body mass index is 37.84 kg/m.  Physical Exam  Constitutional: Patient appears well-developed and well-nourished. Obese  No distress.  HEENT: head atraumatic, normocephalic, pupils equal and reactive to light, neck supple Cardiovascular: Normal rate, regular rhythm and normal heart sounds.  No murmur heard. No BLE edema. Pulmonary/Chest: Effort normal and breath sounds normal. No respiratory distress. Abdominal: Soft.  There is no tenderness. Muscular skeletal: effusion right knee, crepitus with extension of both knees, no redness or increase in warmth, very tender during palpation of patella, rom of knee and also some paresthesia on right anterior knee  Psychiatric: Patient has a normal mood and affect. behavior is normal. Judgment and thought content normal.   PHQ2/9: Depression screen [facility] 2/9 05/08/2020 01/05/2020 12/22/2019 11/14/2019 10/25/2019  Decreased Interest 3 3 3  0 0  Down, Depressed, Hopeless 2 - 3 1 0  PHQ - 2 Score 5 3 6 1  0  Altered sleeping 0 3 3 1  0  Tired, decreased energy 3 3 - 1 0  Change in appetite 1 3 0 0 0  Feeling bad or failure about yourself  3 3 3  0 0  Trouble concentrating 0 1 3 0 0  Moving slowly or fidgety/restless 0 3 3 0 0  Suicidal thoughts 0 0 1 0 0  PHQ-9 Score 12 19 19 3  0  Difficult doing work/chores - Extremely dIfficult Very difficult Not difficult at all Not difficult at all  Some recent data might be  hidden    Positive   Fall Risk: Fall Risk  05/08/2020 01/05/2020 12/27/2019 12/22/2019 11/14/2019  Falls in the past year? 1 0 0 0 0  Number falls in past yr: 1 0 0 0 0  Injury with Fall?  1 0 0 0 0  Comment - - - - -  Risk for fall due to : - - - No Fall Risks -  Risk for fall due to: Comment - - - - -  Follow up - Falls evaluation completed Falls evaluation completed Falls prevention discussed -     Functional Status Survey: Is the patient deaf or have difficulty hearing?: No Does the patient have difficulty seeing, even when wearing glasses/contacts?: No Does the patient have difficulty concentrating, remembering, or making decisions?: No Does the patient have difficulty walking or climbing stairs?: Yes Does the patient have difficulty dressing or bathing?: No Does the patient have difficulty doing errands alone such as visiting a doctor's office or shopping?: No    Assessment & Plan  1. Osteoarthritis of multiple joints, unspecified osteoarthritis type  - pantoprazole (PROTONIX) 40 MG tablet; Take 1 tablet (40 mg total) by mouth daily.  Dispense: 30 tablet; Refill: 0 - ibuprofen (ADVIL) 600 MG tablet; Take 1 tablet (600 mg total) by mouth every 8 (eight) hours as needed.  Dispense: 60 tablet; Refill: 0 - DG Knee 4 Views W/Patella Right; Future  2. Effusion of bursa of right knee  - pantoprazole (PROTONIX) 40 MG tablet; Take 1 tablet (40 mg total) by mouth daily.  Dispense: 30 tablet; Refill: 0 - ibuprofen (ADVIL) 600 MG tablet; Take 1 tablet (600 mg total) by mouth every 8 (eight) hours as needed.  Dispense: 60 tablet; Refill: 0 - DG Knee 4 Views W/Patella Right; Future  3. History of GI bleed   4. History of recent fall  - DG Knee 4 Views W/Patella Right; Future  5. Intertrigo  - nystatin (MYCOSTATIN/NYSTOP) powder; Apply topically 2 (two) times daily.  Dispense: 60 g; Refill: 0

## 2020-05-08 ENCOUNTER — Ambulatory Visit
Admission: RE | Admit: 2020-05-08 | Discharge: 2020-05-08 | Disposition: A | Discharge status: Home / Self Care | Payer: Medicare Other | Attending: Family Medicine | Admitting: Family Medicine

## 2020-05-08 ENCOUNTER — Ambulatory Visit (INDEPENDENT_AMBULATORY_CARE_PROVIDER_SITE_OTHER): Payer: Medicare Other | Admitting: Family Medicine

## 2020-05-08 ENCOUNTER — Other Ambulatory Visit: Payer: Self-pay | Admitting: Family Medicine

## 2020-05-08 ENCOUNTER — Other Ambulatory Visit: Payer: Self-pay

## 2020-05-08 ENCOUNTER — Ambulatory Visit
Admission: RE | Admit: 2020-05-08 | Discharge: 2020-05-08 | Disposition: A | Discharge status: Home / Self Care | Payer: Medicare Other | Source: Ambulatory Visit | Attending: Family Medicine | Admitting: Family Medicine

## 2020-05-08 ENCOUNTER — Encounter: Payer: Self-pay | Admitting: Family Medicine

## 2020-05-08 VITALS — BP 138/88 | HR 76 | Temp 98.0°F | Resp 16 | Ht 65.0 in | Wt 227.4 lb

## 2020-05-08 DIAGNOSIS — Z9181 History of falling: Secondary | ICD-10-CM

## 2020-05-08 DIAGNOSIS — Z8719 Personal history of other diseases of the digestive system: Secondary | ICD-10-CM | POA: Diagnosis not present

## 2020-05-08 DIAGNOSIS — L304 Erythema intertrigo: Secondary | ICD-10-CM

## 2020-05-08 DIAGNOSIS — M25461 Effusion, right knee: Secondary | ICD-10-CM

## 2020-05-08 DIAGNOSIS — M159 Polyosteoarthritis, unspecified: Secondary | ICD-10-CM | POA: Insufficient documentation

## 2020-05-08 DIAGNOSIS — F10920 Alcohol use, unspecified with intoxication, uncomplicated: Secondary | ICD-10-CM | POA: Insufficient documentation

## 2020-05-08 DIAGNOSIS — D473 Essential (hemorrhagic) thrombocythemia: Secondary | ICD-10-CM

## 2020-05-08 HISTORY — DX: Essential (hemorrhagic) thrombocythemia: D47.3

## 2020-05-08 MED ORDER — NYSTATIN 100000 UNIT/GM EX POWD: Freq: Two times a day (BID) | CUTANEOUS | 0 refills | Status: DC

## 2020-05-08 MED ORDER — PANTOPRAZOLE SODIUM 40 MG PO TBEC: 40.0000 mg | DELAYED_RELEASE_TABLET | Freq: Every day | ORAL | 0 refills | Status: DC

## 2020-05-08 MED ORDER — IBUPROFEN 600 MG PO TABS: 600.0000 mg | ORAL_TABLET | Freq: Three times a day (TID) | ORAL | 0 refills | Status: DC | PRN

## 2020-05-08 NOTE — Telephone Encounter (Signed)
  Notes to clinic:  requesting 90 day  Review for change   Requested Prescriptions  Pending Prescriptions Disp Refills   pantoprazole (PROTONIX) 40 MG tablet [Pharmacy Med Name: PANTOPRAZOLE 40MG  TABLETS] 90 tablet     Sig: TAKE 1 TABLET(40 MG) BY MOUTH DAILY      Gastroenterology: Proton Pump Inhibitors Passed - 05/08/2020  9:19 AM      Passed - Valid encounter within last 12 months    Recent Outpatient Visits           Today Osteoarthritis of multiple joints, unspecified osteoarthritis type   [facility] Steele Sizer, MD   4 months ago Encounter for screening mammogram for malignant neoplasm of breast   [place], MD   5 months ago Encounter for examination following treatment at hospital   [place], MD   6 months ago Pain in both lower extremities   [facility] Tow[place] Malkin, MD   1 year ago Cellulitis of lower extremity, unspecified laterality   [place], FNP       Future Appointments             In 1 month [facility] [place]   In 7 months  [facility], [facility]

## 2020-06-20 ENCOUNTER — Encounter: Payer: Self-pay | Admitting: Family Medicine

## 2020-06-20 ENCOUNTER — Telehealth (INDEPENDENT_AMBULATORY_CARE_PROVIDER_SITE_OTHER): Payer: Medicare Other | Admitting: Family Medicine

## 2020-06-20 DIAGNOSIS — Z20822 Contact with and (suspected) exposure to covid-19: Secondary | ICD-10-CM

## 2020-06-20 DIAGNOSIS — J069 Acute upper respiratory infection, unspecified: Secondary | ICD-10-CM

## 2020-06-20 NOTE — Patient Instructions (Addendum)
Our plans for today:  - See below for self-isolation guidelines. You may end your quarantine if you have a negative test or, if positive, once you are 10 days from symptom onset and fever free for 24 hours without use of tylenol or ibuprofen.  - If your first test is negative, I recommend retesting a few days later given your symptom onset was today and you have known COVID exposure.  - If your test is positive, we will be referring you for COVID treatment with either the monoclonal antibody treatment or oral antiviral. Someone would be contacting you about scheduling this if you qualify. We are experiencing a Producer, television/film/video of this and the oral antivirals as well as a backlog of patients needing treatment so this may cause a delay. - Certainly, if you are having difficulties breathing or unable to keep down fluids, go to the Emergency Department.   Take care and seek immediate care sooner if you develop any concerns.   Dr. Ky Barban

## 2020-06-20 NOTE — Progress Notes (Signed)
Virtual Visit Note  I connected with Linda Mejia on 06/20/20 at  9:20 AM EST by a video enabled telemedicine application and verified that I am speaking with the correct person using two identifiers.  Location: Patient: home Provider: [facility]   I discussed the limitations of evaluation and management by telemedicine and the availability of in person appointments. The patient expressed understanding and agreed to proceed.  History of Present Illness:  UPPER RESPIRATORY TRACT INFECTION - symptom onset: 06/20/20 - was around grandchildren and daughter who tested positive for COVID yesterday - has received 3 doses of [place] vaccine. - main symptoms: stuffy nose, headache, throat scratchy. - has not been tested yet. Going to the HD today for testing.  Fever: no Cough: no Shortness of breath: no Wheezing: no Chest pain: no Chest tightness: no Chest congestion: no Nasal congestion: yes Runny nose: yes Sneezing: no Sore throat: yes Sinus pressure: yes Headache: yes Ear pain: no  Ear pressure: no  Vomiting: no Sick contacts: yes Recurrent sinusitis: no Relief with OTC cold/cough medications: hasn't tried  Treatments attempted:  tylenol, flonase    Observations/Objective:  Patient had trouble connecting to video visit, entirety of visit conducted over the phone.  Speaks in full sentences, no respiratory distress.  Assessment and Plan:  Viral URI With COVID exposure, testing today. Doing well with mild sx. Would be a candidate COVID tx referral if positive. Reviewed OTC symptom relief, self-quarantine guidelines, and emergency precautions.     I discussed the assessment and treatment plan with the patient. The patient was provided an opportunity to ask questions and all were answered. The patient agreed with the plan and demonstrated an understanding of the instructions.   The patient was advised to call back or seek an in-person evaluation if the symptoms worsen or if the  condition fails to improve as anticipated.  I provided 7 minutes of non-face-to-face time during this encounter.   Myles Gip, DO

## 2020-06-23 ENCOUNTER — Emergency Department: Payer: Medicare Other

## 2020-06-23 ENCOUNTER — Encounter: Payer: Self-pay | Admitting: Intensive Care

## 2020-06-23 ENCOUNTER — Observation Stay
Admission: EM | Admit: 2020-06-23 | Discharge: 2020-06-26 | Disposition: A | Discharge status: Home / Self Care | Payer: Medicare Other | Attending: Internal Medicine | Admitting: Internal Medicine

## 2020-06-23 ENCOUNTER — Observation Stay: Payer: Medicare Other

## 2020-06-23 ENCOUNTER — Other Ambulatory Visit: Payer: Self-pay

## 2020-06-23 DIAGNOSIS — Z20822 Contact with and (suspected) exposure to covid-19: Secondary | ICD-10-CM | POA: Diagnosis not present

## 2020-06-23 DIAGNOSIS — F419 Anxiety disorder, unspecified: Secondary | ICD-10-CM | POA: Diagnosis not present

## 2020-06-23 DIAGNOSIS — Z8711 Personal history of peptic ulcer disease: Secondary | ICD-10-CM | POA: Insufficient documentation

## 2020-06-23 DIAGNOSIS — Z7982 Long term (current) use of aspirin: Secondary | ICD-10-CM | POA: Diagnosis not present

## 2020-06-23 DIAGNOSIS — R112 Nausea with vomiting, unspecified: Secondary | ICD-10-CM | POA: Diagnosis present

## 2020-06-23 DIAGNOSIS — I700 Atherosclerosis of aorta: Secondary | ICD-10-CM | POA: Insufficient documentation

## 2020-06-23 DIAGNOSIS — I2510 Atherosclerotic heart disease of native coronary artery without angina pectoris: Secondary | ICD-10-CM | POA: Insufficient documentation

## 2020-06-23 DIAGNOSIS — Z888 Allergy status to other drugs, medicaments and biological substances status: Secondary | ICD-10-CM | POA: Insufficient documentation

## 2020-06-23 DIAGNOSIS — Z8249 Family history of ischemic heart disease and other diseases of the circulatory system: Secondary | ICD-10-CM | POA: Diagnosis not present

## 2020-06-23 DIAGNOSIS — R195 Other fecal abnormalities: Secondary | ICD-10-CM | POA: Insufficient documentation

## 2020-06-23 DIAGNOSIS — R6510 Systemic inflammatory response syndrome (SIRS) of non-infectious origin without acute organ dysfunction: Secondary | ICD-10-CM | POA: Diagnosis not present

## 2020-06-23 DIAGNOSIS — M6281 Muscle weakness (generalized): Secondary | ICD-10-CM | POA: Insufficient documentation

## 2020-06-23 DIAGNOSIS — R768 Other specified abnormal immunological findings in serum: Secondary | ICD-10-CM | POA: Diagnosis present

## 2020-06-23 DIAGNOSIS — A419 Sepsis, unspecified organism: Secondary | ICD-10-CM | POA: Diagnosis present

## 2020-06-23 DIAGNOSIS — R0902 Hypoxemia: Secondary | ICD-10-CM | POA: Diagnosis not present

## 2020-06-23 DIAGNOSIS — Z833 Family history of diabetes mellitus: Secondary | ICD-10-CM | POA: Diagnosis not present

## 2020-06-23 DIAGNOSIS — K219 Gastro-esophageal reflux disease without esophagitis: Secondary | ICD-10-CM | POA: Diagnosis not present

## 2020-06-23 DIAGNOSIS — R0602 Shortness of breath: Secondary | ICD-10-CM | POA: Insufficient documentation

## 2020-06-23 DIAGNOSIS — Z79899 Other long term (current) drug therapy: Secondary | ICD-10-CM | POA: Insufficient documentation

## 2020-06-23 DIAGNOSIS — Z6836 Body mass index (BMI) 36.0-36.9, adult: Secondary | ICD-10-CM

## 2020-06-23 DIAGNOSIS — E785 Hyperlipidemia, unspecified: Secondary | ICD-10-CM | POA: Diagnosis not present

## 2020-06-23 DIAGNOSIS — R109 Unspecified abdominal pain: Secondary | ICD-10-CM | POA: Insufficient documentation

## 2020-06-23 DIAGNOSIS — R197 Diarrhea, unspecified: Secondary | ICD-10-CM | POA: Insufficient documentation

## 2020-06-23 DIAGNOSIS — F410 Panic disorder [episodic paroxysmal anxiety] without agoraphobia: Secondary | ICD-10-CM | POA: Diagnosis present

## 2020-06-23 DIAGNOSIS — I10 Essential (primary) hypertension: Secondary | ICD-10-CM | POA: Insufficient documentation

## 2020-06-23 DIAGNOSIS — F900 Attention-deficit hyperactivity disorder, predominantly inattentive type: Secondary | ICD-10-CM | POA: Diagnosis present

## 2020-06-23 DIAGNOSIS — R7303 Prediabetes: Secondary | ICD-10-CM | POA: Insufficient documentation

## 2020-06-23 DIAGNOSIS — Z87891 Personal history of nicotine dependence: Secondary | ICD-10-CM | POA: Insufficient documentation

## 2020-06-23 DIAGNOSIS — E782 Mixed hyperlipidemia: Secondary | ICD-10-CM

## 2020-06-23 DIAGNOSIS — J069 Acute upper respiratory infection, unspecified: Secondary | ICD-10-CM | POA: Diagnosis not present

## 2020-06-23 DIAGNOSIS — R6520 Severe sepsis without septic shock: Secondary | ICD-10-CM | POA: Diagnosis present

## 2020-06-23 DIAGNOSIS — F32A Depression, unspecified: Secondary | ICD-10-CM

## 2020-06-23 DIAGNOSIS — R509 Fever, unspecified: Secondary | ICD-10-CM | POA: Diagnosis present

## 2020-06-23 DIAGNOSIS — B009 Herpesviral infection, unspecified: Secondary | ICD-10-CM | POA: Diagnosis present

## 2020-06-23 DIAGNOSIS — M2550 Pain in unspecified joint: Secondary | ICD-10-CM | POA: Diagnosis present

## 2020-06-23 DIAGNOSIS — Z8619 Personal history of other infectious and parasitic diseases: Secondary | ICD-10-CM | POA: Diagnosis not present

## 2020-06-23 HISTORY — DX: Encounter for screening for human immunodeficiency virus (HIV): Z11.4

## 2020-06-23 LAB — COMPREHENSIVE METABOLIC PANEL
ALT: 14 U/L (ref 0–44)
AST: 27 U/L (ref 15–41)
Albumin: 4 g/dL (ref 3.5–5.0)
Alkaline Phosphatase: 82 U/L (ref 38–126)
Anion gap: 11 (ref 5–15)
BUN: 14 mg/dL (ref 8–23)
CO2: 25 mmol/L (ref 22–32)
Calcium: 8.2 mg/dL — ABNORMAL LOW (ref 8.9–10.3)
Chloride: 99 mmol/L (ref 98–111)
Creatinine, Ser: 0.73 mg/dL (ref 0.44–1.00)
GFR, Estimated: >60 mL/min (ref >60)
Glucose, Bld: 128 mg/dL — ABNORMAL HIGH (ref 70–99)
Potassium: 4.2 mmol/L (ref 3.5–5.1)
Sodium: 135 mmol/L (ref 135–145)
Total Bilirubin: 0.7 mg/dL (ref 0.3–1.2)
Total Protein: 7.4 g/dL (ref 6.5–8.1)

## 2020-06-23 LAB — CBC WITH DIFFERENTIAL/PLATELET
Abs Immature Granulocytes: 0.06 10*3/uL (ref 0.00–0.07)
Basophils Absolute: 0.1 10*3/uL (ref 0.0–0.1)
Basophils Relative: 0 %
Eosinophils Absolute: 0.1 10*3/uL (ref 0.0–0.5)
Eosinophils Relative: 0 %
HCT: 40.6 % (ref 36.0–46.0)
Hemoglobin: 13.2 g/dL (ref 12.0–15.0)
Immature Granulocytes: 0 %
Lymphocytes Relative: 12 %
Lymphs Abs: 1.8 10*3/uL (ref 0.7–4.0)
MCH: 31.4 pg (ref 26.0–34.0)
MCHC: 32.5 g/dL (ref 30.0–36.0)
MCV: 96.4 fL (ref 80.0–100.0)
Monocytes Absolute: 1 10*3/uL (ref 0.1–1.0)
Monocytes Relative: 7 %
Neutro Abs: 12.4 10*3/uL — ABNORMAL HIGH (ref 1.7–7.7)
Neutrophils Relative %: 81 %
Platelets: 322 10*3/uL (ref 150–400)
RBC: 4.21 MIL/uL (ref 3.87–5.11)
RDW: 12.2 % (ref 11.5–15.5)
WBC: 15.3 10*3/uL — ABNORMAL HIGH (ref 4.0–10.5)
nRBC: 0 % (ref 0.0–0.2)

## 2020-06-23 LAB — LACTIC ACID, PLASMA
Lactic Acid, Venous: 1.2 mmol/L (ref 0.5–1.9)
Lactic Acid, Venous: 2.2 mmol/L (ref 0.5–1.9)
Lactic Acid, Venous: 1.3 mmol/L (ref 0.5–1.9)

## 2020-06-23 LAB — URINALYSIS, COMPLETE (UACMP) WITH MICROSCOPIC
Bacteria, UA: NONE SEEN
Bilirubin Urine: NEGATIVE
Glucose, UA: NEGATIVE mg/dL
Ketones, ur: NEGATIVE mg/dL
Leukocytes,Ua: NEGATIVE
Nitrite: NEGATIVE
Protein, ur: NEGATIVE mg/dL
Specific Gravity, Urine: 1.001 — ABNORMAL LOW (ref 1.005–1.030)
Squamous Epithelial / HPF: NONE SEEN (ref 0–5)
WBC, UA: NONE SEEN WBC/hpf (ref 0–5)
pH: 7 (ref 5.0–8.0)

## 2020-06-23 LAB — LIPASE, BLOOD: Lipase: 26 U/L (ref 11–51)

## 2020-06-23 LAB — PROCALCITONIN: Procalcitonin: <0.1 ng/mL

## 2020-06-23 LAB — RESP PANEL BY RT-PCR (FLU A&B, COVID) ARPGX2
Influenza A by PCR: NEGATIVE
Influenza B by PCR: NEGATIVE
SARS Coronavirus 2 by RT PCR: NEGATIVE

## 2020-06-23 LAB — BRAIN NATRIURETIC PEPTIDE: B Natriuretic Peptide: 141.4 pg/mL — ABNORMAL HIGH (ref 0.0–100.0)

## 2020-06-23 LAB — TSH: TSH: 0.892 u[IU]/mL (ref 0.350–4.500)

## 2020-06-23 MED ORDER — SODIUM CHLORIDE 0.9 % IV SOLN: INTRAVENOUS | Status: DC

## 2020-06-23 MED ORDER — CITALOPRAM HYDROBROMIDE 20 MG PO TABS
20.0000 mg | ORAL_TABLET | Freq: Every day | ORAL | Status: DC
  Filled 2020-06-23: qty 1

## 2020-06-23 MED ORDER — METOPROLOL TARTRATE 50 MG PO TABS
50.0000 mg | ORAL_TABLET | Freq: Two times a day (BID) | ORAL | Status: DC
  Administered 2020-06-24: 50 mg via ORAL
  Filled 2020-06-23: qty 1

## 2020-06-23 MED ORDER — ASPIRIN EC 81 MG PO TBEC
81.0000 mg | DELAYED_RELEASE_TABLET | Freq: Every day | ORAL | Status: DC
  Administered 2020-06-24 – 2020-06-26 (×3): 81 mg via ORAL
  Filled 2020-06-23 (×3): qty 1

## 2020-06-23 MED ORDER — METRONIDAZOLE IN NACL 5-0.79 MG/ML-% IV SOLN
500.0000 mg | Freq: Three times a day (TID) | INTRAVENOUS | Status: DC
  Administered 2020-06-24 – 2020-06-25 (×4): 500 mg via INTRAVENOUS
  Filled 2020-06-23 (×6): qty 100

## 2020-06-23 MED ORDER — ACETAMINOPHEN 325 MG PO TABS
650.0000 mg | ORAL_TABLET | Freq: Once | ORAL | Status: AC
  Administered 2020-06-23: 650 mg via ORAL

## 2020-06-23 MED ORDER — SODIUM CHLORIDE 0.9 % IV BOLUS (SEPSIS)
800.0000 mL | Freq: Once | INTRAVENOUS | Status: AC
  Administered 2020-06-24: 01:00:00 800 mL via INTRAVENOUS

## 2020-06-23 MED ORDER — SODIUM CHLORIDE 0.9 % IV BOLUS (SEPSIS)
1000.0000 mL | Freq: Once | INTRAVENOUS | Status: AC
  Administered 2020-06-24: 1000 mL via INTRAVENOUS

## 2020-06-23 MED ORDER — VANCOMYCIN HCL 1500 MG/300ML IV SOLN
1500.0000 mg | Freq: Once | INTRAVENOUS | Status: AC
  Administered 2020-06-24: 05:00:00 1500 mg via INTRAVENOUS
  Filled 2020-06-23 (×2): qty 300

## 2020-06-23 MED ORDER — IOHEXOL 300 MG/ML  SOLN
100.0000 mL | Freq: Once | INTRAMUSCULAR | Status: AC | PRN
  Administered 2020-06-23: 100 mL via INTRAVENOUS

## 2020-06-23 MED ORDER — ACYCLOVIR 200 MG PO CAPS
400.0000 mg | ORAL_CAPSULE | Freq: Two times a day (BID) | ORAL | Status: DC
  Administered 2020-06-24 – 2020-06-26 (×5): 400 mg via ORAL
  Filled 2020-06-23 (×6): qty 2

## 2020-06-23 MED ORDER — ACETAMINOPHEN 650 MG RE SUPP: 325.0000 mg | Freq: Four times a day (QID) | RECTAL | Status: DC | PRN

## 2020-06-23 MED ORDER — SODIUM CHLORIDE 0.9 % IV BOLUS
1000.0000 mL | Freq: Once | INTRAVENOUS | Status: AC
  Administered 2020-06-23: 1000 mL via INTRAVENOUS

## 2020-06-23 MED ORDER — DIAZEPAM 5 MG PO TABS
10.0000 mg | ORAL_TABLET | Freq: Three times a day (TID) | ORAL | Status: DC | PRN
  Administered 2020-06-25 (×2): 10 mg via ORAL
  Filled 2020-06-23 (×2): qty 2

## 2020-06-23 MED ORDER — THIAMINE HCL 100 MG/ML IJ SOLN: 100.0000 mg | Freq: Every day | INTRAMUSCULAR | Status: DC

## 2020-06-23 MED ORDER — FLUTICASONE PROPIONATE 50 MCG/ACT NA SUSP
2.0000 | Freq: Every day | NASAL | Status: DC | PRN
  Filled 2020-06-23: qty 16

## 2020-06-23 MED ORDER — PANTOPRAZOLE SODIUM 40 MG PO TBEC
40.0000 mg | DELAYED_RELEASE_TABLET | Freq: Two times a day (BID) | ORAL | Status: DC
  Administered 2020-06-24 – 2020-06-26 (×5): 40 mg via ORAL
  Filled 2020-06-23 (×5): qty 1

## 2020-06-23 MED ORDER — VANCOMYCIN HCL IN DEXTROSE 1-5 GM/200ML-% IV SOLN: 1000.0000 mg | Freq: Once | INTRAVENOUS | Status: DC

## 2020-06-23 MED ORDER — ADULT MULTIVITAMIN W/MINERALS CH
1.0000 | ORAL_TABLET | Freq: Every day | ORAL | Status: DC
  Administered 2020-06-24 – 2020-06-26 (×3): 1 via ORAL
  Filled 2020-06-23 (×3): qty 1

## 2020-06-23 MED ORDER — ACETAMINOPHEN 325 MG PO TABS
325.0000 mg | ORAL_TABLET | Freq: Four times a day (QID) | ORAL | Status: DC | PRN
  Filled 2020-06-23: qty 1

## 2020-06-23 MED ORDER — FOLIC ACID 1 MG PO TABS
1.0000 mg | ORAL_TABLET | Freq: Every day | ORAL | Status: DC
  Administered 2020-06-24 – 2020-06-26 (×3): 1 mg via ORAL
  Filled 2020-06-23 (×3): qty 1

## 2020-06-23 MED ORDER — THIAMINE HCL 100 MG PO TABS
100.0000 mg | ORAL_TABLET | Freq: Every day | ORAL | Status: DC
  Administered 2020-06-24 – 2020-06-26 (×3): 100 mg via ORAL
  Filled 2020-06-23 (×3): qty 1

## 2020-06-23 MED ORDER — VITAMIN B-12 1000 MCG PO TABS
1000.0000 ug | ORAL_TABLET | Freq: Every day | ORAL | Status: DC
  Administered 2020-06-24 – 2020-06-26 (×3): 1000 ug via ORAL
  Filled 2020-06-23 (×3): qty 1

## 2020-06-23 MED ORDER — LORAZEPAM 2 MG/ML IJ SOLN: 1.0000 mg | INTRAMUSCULAR | Status: DC | PRN

## 2020-06-23 MED ORDER — SODIUM CHLORIDE 0.9 % IV SOLN
2.0000 g | Freq: Once | INTRAVENOUS | Status: AC
  Administered 2020-06-24: 01:00:00 2 g via INTRAVENOUS
  Filled 2020-06-23: qty 2

## 2020-06-23 MED ORDER — ONDANSETRON HCL 4 MG/2ML IJ SOLN: 4.0000 mg | Freq: Four times a day (QID) | INTRAMUSCULAR | Status: DC | PRN

## 2020-06-23 MED ORDER — LORAZEPAM 1 MG PO TABS: 1.0000 mg | ORAL_TABLET | ORAL | Status: DC | PRN

## 2020-06-23 MED ORDER — AMPHETAMINE-DEXTROAMPHETAMINE 30 MG PO TABS: 30.0000 mg | ORAL_TABLET | Freq: Two times a day (BID) | ORAL | Status: DC

## 2020-06-23 MED ORDER — ONDANSETRON HCL 4 MG PO TABS: 4.0000 mg | ORAL_TABLET | Freq: Four times a day (QID) | ORAL | Status: DC | PRN

## 2020-06-23 MED ORDER — ACETAMINOPHEN 325 MG PO TABS
ORAL_TABLET | ORAL | Status: AC
  Filled 2020-06-23: qty 2

## 2020-06-23 MED ORDER — ENOXAPARIN SODIUM 60 MG/0.6ML ~~LOC~~ SOLN
0.5000 mg/kg | SUBCUTANEOUS | Status: DC
  Administered 2020-06-24 – 2020-06-25 (×3): 47.5 mg via SUBCUTANEOUS
  Filled 2020-06-23 (×3): qty 0.6

## 2020-06-23 MED ORDER — CITALOPRAM HYDROBROMIDE 20 MG PO TABS: 10.0000 mg | ORAL_TABLET | Freq: Every day | ORAL | Status: DC

## 2020-06-23 MED ORDER — VANCOMYCIN HCL IN DEXTROSE 1-5 GM/200ML-% IV SOLN
1000.0000 mg | Freq: Once | INTRAVENOUS | Status: AC
  Administered 2020-06-24: 04:00:00 1000 mg via INTRAVENOUS
  Filled 2020-06-23: qty 200

## 2020-06-23 NOTE — ED Notes (Signed)
Pt with CT. 

## 2020-06-23 NOTE — ED Notes (Signed)
Nasopharyngeal swab sent to lab.

## 2020-06-23 NOTE — ED Notes (Addendum)
Pt c/o fever, body aches, right and left rib pain, SOB, sore throat, mild cough. Pt states she thew up this morning and states she's either constipated or has diarrhea for the last month and her stool is dark. Pt reports she took tylenol several hours ago. Pt states her daughter and grand daughter had covid and she was exposed last week. Breath sounds clear bilaterally, cough noted.

## 2020-06-23 NOTE — ED Notes (Signed)
Oxygen dips down to 87-90% with ambulation

## 2020-06-23 NOTE — ED Triage Notes (Signed)
Patient c/o severe body aches and fever that started this AM. Temp 102 at home

## 2020-06-23 NOTE — ED Triage Notes (Signed)
Pt in via EMS from home with c/o fever, chills, bodyaches that started this am. 102.2 oral temp. 118/88, HR 87, 94% RA

## 2020-06-23 NOTE — ED Notes (Addendum)
Pt c/o headache and body aches only at this time.

## 2020-06-23 NOTE — ED Notes (Signed)
Patient placed on 2L Oxygen during triage

## 2020-06-23 NOTE — H&P (Addendum)
History and Physical   Linda Mejia STM:[id number] DOB: Jun 09, 1955 DOA: 06/23/2020  PCP: Steele Sizer, MD  Outpatient Specialists: Dr. Clayborn Bigness, [facility] Cardiology and Dr. Percell Boston, Rheumatology Patient coming from: home  I have personally briefly reviewed patient's old medical records in [place].  Chief Concern: fever, nausea, vomiting, diarrhea  HPI: Linda Mejia is a 65 y.o. female with medical history significant for anxiety, arthritis, CAD, history of duodenitis, erosive gastritis, hypertension, irregular Z-line of the esophagus, history of Helicobacter pylori infection, obesity, hypertension, rheumatoid arthritis, GERD, presented to the emergency department for chief concerns of fever, nausea, vomiting, diarrhea.  Linda Mejia reports that she developed fever, nausea, and vomiting.  She states that she vomited once and she denies red blood and coffee ground, at 5 AM. She endorsed diffused joint pain, and chills. She took two tylenols which improved her symptoms. She endorses the last time she felt this way, she was diagnosed and treated for septic shock, approximately 2 years ago.  She endorses diarrhea, watery, 2-3 bowel movements per day, and has been taking diarrhea suppression medication.  She states that she has been having diarrhea for 1 week.  She endorses dark stool and this has been going on since February 2022. She takes daily PPI, protonix. She endorses lost of appetite for one month.   She endorses sick contacts specifically, she reports her daughter and her granddaughter tested positive for covid, 06/17/20 and patient was exposed 06/17/20.  She denies changes to her diet.  She denies recent antibiotic use and/or steroid use.  She denies history of C. difficile infection.  She reports that the last injection she had for chronic joint pain was 3 months ago.  She cannot localize any new joint swelling or new joint pain.  She denies any new rashes.  Social history: lives by  herself. Former tobacco user, 1 ppd, quit in 2006. Started smoking at age 17. She occasionally drinks etoh, two glasses of wine, three times per month. 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   Vaccinations: three doses for covid-19 with [place] on 07/18/2019, 08/09/2019, and 04/10/2020.  ROS: Constitutional: + weight change, + fever ENT/Mouth: + sore throat, no rhinorrhea Eyes: no eye pain, no vision changes Cardiovascular: no chest pain, + dyspnea,  no edema, no palpitations Respiratory: no cough, no sputum, no wheezing Gastrointestinal: + nausea, + vomiting, no diarrhea, no constipation Genitourinary: no urinary incontinence, no dysuria, no hematuria Musculoskeletal: no arthralgias, no myalgias Skin: no skin lesions, no pruritus, Neuro: + weakness, no loss of consciousness, no syncope Psych: no anxiety, no depression, + decrease appetite Heme/Lymph: no bruising, no bleeding  ED Course: Discussed with ED provider, patient requiring hospitalization due to possible Covid infection.  Covid test PCR was negative in the ED  Temperature was 100.2 Fahrenheit, respiratory rate 17, heart rate of 78, blood pressure 126/67, patient desatted to 87% on room air with ambulation.  Patient does not require oxygen supplementation at baseline.  On 2 L nasal cannula she was satting at 91%.  Vitals in the emergency department was remarkable for lactic acid initially 1.2 and increased to 2.2, pro-Cal less than 0.10, serum creatinine of 0.73, WBC 15.3, hemoglobin 13.2, platelets 322.  Assessment/Plan  Principal Problem:   Severe sepsis (HCC) Active Problems:   ADHD (attention deficit hyperactivity disorder), inattentive type   Hypertension goal BP (blood pressure) < 140/90   HLD (hyperlipidemia)   Episodic paroxysmal anxiety  disorder   History of gastric ulcer   Herpes simplex type 2 infection   Polyarthralgia   Class 2 severe obesity due to excess  calories with serious comorbidity and body mass index (BMI) of 36.0 to 36.9 in adult [facility])   History of tobacco use   ANA positive   Anxiety and depression   Intractable vomiting with nausea   # Severe sepsis-source work-up in progress # Shortness of breath with acute hypoxia -Given recent nuclear medicine stress test and echo, doubt heart failure exacerbation and or ACS -WBC 15.3, lactic acid 2.2, temperature of 102.2 at home status post two Tylenols at home, acute hypoxia -Query idiopathic C. difficile colitis -UA per EDP was read as negative for nitrates and leukocytes -Blood cultures x2 -Procalcitonin was negative per ED provider -C. difficile panel, GI panel -Lactic x2 more labs ordered, TSH ordered -Procalcitonin in the a.m. -CT abdomen and pelvis with contrast ordered -CBC in the a.m. -Sepsis antibiotic for unknown source initiated: Cefepime and vancomycin per pharmacy, metronidazole -Ordered stat sepsis bolus -Normal saline 150 cc/h -Stat EKG ordered -As patient was recently exposed to 2 family members diagnosed with COVID-19, would recommend that if she does not improve her respiratory status a.m. team to retest her for COVID-19 in 1 to 2 days  # GERD-history of H. pylori infection -Hemoglobin hematocrit is stable and at her baseline -CBC in the a.m. -If her hemoglobin hematocrit drops in the a.m. should she continue to have dark stool, I recommend a.m. team to consult GI in the a.m.  # HSV 2 infection-resumed acyclovir 400 mg twice daily  # Anxiety-resumed home diazepam 10 mg every 8 hours as needed for anxiety # Depression -Celexa 20 mg daily History of alcohol intoxication though she denies regular p.o. alcohol intake -CIWA protocol initiated  # ADHD-Adderall 30 mg tablet p.o. twice daily resumed  # Hypertension-metoprolol tartrate 50 mg resumed for 06/24/2020 as she is currently being worked up for sepsis at this time  # History of polymyalgia  rheumatica-outpatient follow-up  Chart reviewed.   Nuclear medicine stress test and echo on 02/08/2020: Impression was read as normal myocardial perfusion scan no evidence of stress-induced myocardial ischemia.  There is mild left ventricular enlargement during stress. ejection fraction of 63%  Echo on 02/08/2020 was read: Left ventricular EF of 50% estimated, normal left ventricular systolic function, normal right ventricular systolic function, trivial regurgitation noted, no valvular stenosis, trivial MR, TR, PR  DVT prophylaxis: Enoxaparin, TED hose Code Status: Full code Diet: Heart healthy/carb modified Family Communication: No Disposition Plan: Pending clinical course Consults called: No Admission status: Observation MedSurg with telemetry  Past Medical History:  Diagnosis Date  . Abnormal ankle brachial index (ABI) 12/02/2016  . ADHD (attention deficit hyperactivity disorder)   . AKI (acute kidney injury) ([place])   . Allergy 2020   Can't take amlopine. Causes swelling  . Anxiety   . Aortic atherosclerosis ([place]) 12/02/2016   July 2018  . Arthritis   . Chronic back pain   . Coronary artery disease   . Depression 2020   I think everyone is depressed at this unprecedented time!  . Encounter for HIV (human immunodeficiency virus) test   . Essential (hemorrhagic) thrombocythemia ([place]) 05/08/2020  . GERD (gastroesophageal reflux disease)   . Herpes genitalis in women   . History of stomach ulcers    x7 this year. Bleeding  . HLD (hyperlipidemia)   . HYPERLIPIDEMIA-MIXED   . Hypertension   . Prediabetes 12/02/2016  .  Pulmonary hypertension ([place]) 03/09/2018   Echo Oct 2019  . 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preiser), left ([place])   . Subclinical hypothyroidism   . Subclinical hypothyroidism   . Ulcer    Past Surgical History:  Procedure Laterality Date  . APPENDECTOMY    . CARDIAC CATHETERIZATION    . CESAREAN SECTION     x2  . COLON SURGERY     interception  . CORONARY  ANGIOPLASTY     stents...  2000  . ESOPHAGOGASTRODUODENOSCOPY (EGD) WITH PROPOFOL N/A 09/21/2015   Procedure: ESOPHAGOGASTRODUODENOSCOPY (EGD) WITH PROPOFOL;  Surgeon: Lucilla Lame, MD;  Location: [facility] ENDOSCOPY;  Service: Endoscopy;  Laterality: N/A;  . ESOPHAGOGASTRODUODENOSCOPY (EGD) WITH PROPOFOL N/A 07/30/2016   Procedure: ESOPHAGOGASTRODUODENOSCOPY (EGD) WITH PROPOFOL;  Surgeon: Jonathon Bellows, MD;  Location: [facility] ENDOSCOPY;  Service: Endoscopy;  Laterality: N/A;  . ESOPHAGOGASTRODUODENOSCOPY (EGD) WITH PROPOFOL N/A 04/23/2018   Procedure: ESOPHAGOGASTRODUODENOSCOPY (EGD) WITH PROPOFOL;  Surgeon: Virgel Manifold, MD;  Location: [facility] ENDOSCOPY;  Service: Endoscopy;  Laterality: N/A;  . ESOPHAGOGASTRODUODENOSCOPY (EGD) WITH PROPOFOL N/A 11/04/2019   Procedure: ESOPHAGOGASTRODUODENOSCOPY (EGD) WITH PROPOFOL;  Surgeon: Lin Landsman, MD;  Location: [facility] ENDOSCOPY;  Service: Gastroenterology;  Laterality: N/A;  . EYE SURGERY  Dec 2021   Droopy eye lids  . LEFT HEART CATH Right 01/25/2018   Procedure: Left Heart Cath and Coronary Angiography;  Surgeon: Dionisio David, MD;  Location: [place] [facility];  Service: Cardiovascular;  Laterality: Right;  . LEFT HEART CATH AND CORONARY ANGIOGRAPHY Right 08/12/2016   Procedure: Left Heart Cath and Coronary Angiography;  Surgeon: Dionisio David, MD;  Location: [place] [facility] LAB;  Service: Cardiovascular;  Laterality: Right;  . SMALL INTESTINE SURGERY    . TONSILLECTOMY    . TUBAL LIGATION     Social History:  reports that she quit smoking about 16 years ago. Her smoking use included cigarettes. She has a 30.00 pack-year smoking history. She has never used smokeless tobacco. She reports current alcohol use of about 1.0 standard drink of alcohol per week. She reports that she does not use drugs.  Allergies  Allergen Reactions  . Amlodipine     Leg swelling  . Statins Other (See Comments)   Family History  Problem Relation Age of Onset  .  Diabetes Mother   . CVA Mother   . Heart disease Father   . Heart disease Sister   . Breast cancer Sister   . Heart disease Brother   . Heart disease Sister   . Heart disease Sister   . Heart disease Sister   . Heart disease Brother   . Heart disease Brother   . Heart disease Brother   . Heart disease Brother   . Healthy Daughter   . Healthy Daughter    Family history: Family history reviewed and not pertinent  Prior to Admission medications   Medication Sig Start Date End Date Taking? Authorizing Provider  acyclovir (ZOVIRAX) 400 MG tablet Take 1 tablet (400 mg total) by mouth 2 (two) times daily. 04/20/20   Towanda Malkin, MD  Alirocumab (PRALUENT) 75 MG/ML SOAJ Inject 1 each into the skin every 14 (fourteen) days. 12/09/18   Hubbard Hartshorn, FNP  amphetamine-dextroamphetamine (ADDERALL) 30 MG tablet Take 30 mg by mouth 2 (two) times daily. 12/02/16   Myer Haff, MD  aspirin EC 81 MG tablet Take 81 mg by mouth daily.    [provider]  cholecalciferol (VITAMIN D3) 25 MCG (1000 UNIT) tablet Take 1,000  Units by mouth daily.    [provider]  cyanocobalamin 1000 MCG tablet Take 1,000 mcg by mouth daily.    [provider]  diazepam (VALIUM) 10 MG tablet Take 10 mg by mouth 3 (three) times daily as needed.    Myer Haff, MD  fluticasone (FLONASE) 50 MCG/ACT nasal spray Place 2 sprays into both nostrils daily as needed for allergies. 12/12/19   Towanda Malkin, MD  metoprolol tartrate (LOPRESSOR) 50 MG tablet Take 1 tablet (50 mg total) by mouth 2 (two) times daily. 03/13/20   Towanda Malkin, MD  nystatin (MYCOSTATIN/NYSTOP) powder Apply topically 2 (two) times daily. 05/08/20   Steele Sizer, MD  pantoprazole (PROTONIX) 40 MG tablet Take 1 tablet (40 mg total) by mouth daily. 05/08/20   Steele Sizer, MD   Physical Exam: Vitals:   06/23/20 2000 06/23/20 2015 06/23/20 2016 06/23/20 2100  BP: 125/76   126/67  Pulse: 81 85  84  Resp:  19   17  Temp:    98.9 F (37.2 C)  TempSrc:    Oral  SpO2: 95% (!) 83% 90% 98%  Weight:      Height:       Constitutional: appears age-appropriate, NAD, calm, comfortable Eyes: PERRL, lids and conjunctivae normal ENMT: Mucous membranes are moist. Posterior pharynx clear of any exudate or lesions. Age-appropriate dentition. Hearing appropriate Neck: normal, supple, no masses, no thyromegaly Respiratory: clear to auscultation bilaterally, no wheezing, no crackles. Normal respiratory effort. No accessory muscle use.  Cardiovascular: Regular rate and rhythm, no murmurs / rubs / gallops. No extremity edema. 2+ pedal pulses. No carotid bruits.  Abdomen: Obese abdomen, generalized tenderness, no masses palpated, no hepatosplenomegaly. Bowel sounds positive.  Musculoskeletal: no clubbing / cyanosis. No joint deformity upper and lower extremities. Good ROM, no contractures, no atrophy. Normal muscle tone.  Skin: no rashes, lesions, ulcers. No induration Neurologic: Sensation intact. Strength 5/5 in all 4.  Psychiatric: Normal judgment and insight. Alert and oriented x 3. Normal mood.   EKG: ordered and read as sinus rate of 82 and qtc of 484  Chest x-ray on Admission: I personally reviewed and I agree with radiologist reading as below.  DG Chest Portable 1 View  Result Date: 06/23/2020 CLINICAL DATA:  Shortness of breath EXAM: PORTABLE CHEST 1 VIEW COMPARISON:  12/12/2019 FINDINGS: The heart size and mediastinal contours are within normal limits. Both lungs are clear. The visualized skeletal structures are unremarkable. IMPRESSION: No acute abnormality of the lungs in AP portable projection. Electronically Signed   By: Eddie Candle M.D.   On: 06/23/2020 17:46   Labs on Admission: I have personally reviewed following labs  CBC: Recent Labs  Lab 06/23/20 1559  WBC 15.3*  NEUTROABS 12.4*  HGB 13.2  HCT 40.6  MCV 96.4  PLT 808   Basic Metabolic Panel: Recent Labs  Lab 06/23/20 1559   NA 135  K 4.2  CL 99  CO2 25  GLUCOSE 128*  BUN 14  CREATININE 0.73  CALCIUM 8.2*   GFR: Estimated Creatinine Clearance: 82.7 mL/min (by C-G formula based on SCr of 0.73 mg/dL).   Liver Function Tests: Recent Labs  Lab 06/23/20 1559  AST 27  ALT 14  ALKPHOS 82  BILITOT 0.7  PROT 7.4  ALBUMIN 4.0   Urine analysis:    Component Value Date/Time   COLORURINE COLORLESS (A) 06/23/2020 1657   APPEARANCEUR CLEAR (A) 06/23/2020 1657   APPEARANCEUR Clear 08/03/2014 2125   LABSPEC 1.001 (  L) 06/23/2020 1657   LABSPEC 1.006 08/03/2014 2125   PHURINE 7.0 06/23/2020 1657   GLUCOSEU NEGATIVE 06/23/2020 1657   GLUCOSEU Negative 08/03/2014 2125   HGBUR SMALL (A) 06/23/2020 1657   BILIRUBINUR NEGATIVE 06/23/2020 1657   BILIRUBINUR neg 06/14/2018 1059   BILIRUBINUR Negative 08/03/2014 2125   KETONESUR NEGATIVE 06/23/2020 1657   PROTEINUR NEGATIVE 06/23/2020 1657   UROBILINOGEN 0.2 06/14/2018 1059   NITRITE NEGATIVE 06/23/2020 1657   LEUKOCYTESUR NEGATIVE 06/23/2020 1657   LEUKOCYTESUR Negative 08/03/2014 2125   CRITICAL CARE Performed by: Briant Cedar Valynn Schamberger  Total critical care time: 35 minutes  Critical care time was exclusive of separately billable procedures and treating other patients.  Critical care was necessary to treat or prevent imminent or life-threatening deterioration. Severe sepsis with organ dysfunction. Acute respiratory failure.  Critical care was time spent personally by me on the following activities: development of treatment plan with patient and/or surrogate as well as nursing, discussions with consultants, evaluation of patient's response to treatment, examination of patient, obtaining history from patient or surrogate, ordering and performing treatments and interventions, ordering and review of laboratory studies, ordering and review of radiographic studies, pulse oximetry and re-evaluation of patient's condition.  Dawt Reeb N Ilias Stcharles D.O. [facility]  If 7PM-7AM,  please contact overnight-coverage provider If 7AM-7PM, please contact day coverage provider [email or web address]  06/23/2020, 10:31 PM

## 2020-06-23 NOTE — ED Provider Notes (Addendum)
[facility] Emergency Department Provider Note  ____________________________________________  Time seen: Approximately 5:32 PM  I have reviewed the triage vital signs and the nursing notes.   HISTORY  Chief Complaint Fever and Generalized Body Aches    HPI Linda JOLLEEN SEMAN is a 65 y.o. female who presents the emergency department complaining of body aches, lower rib/epigastric pain, mild cough, sore throat.  Patient states that symptoms began this morning.  Her temp max at home was 102.2 F.  She has had one episode of emesis and has had some ongoing diarrhea or constipation over the last month.  Patient states that she was exposed to both her daughter and granddaughter who had Covid last week.  Again she developed symptoms today.  No headache, visual changes, neck pain or stiffness, chest pain, frank abdominal pain, dysuria, polyuria or hematuria.  She is taken Tylenol and Motrin at home for symptoms.  No other medications prior to arrival.  History of thrombocytopenia, arthritis, PE, coronary artery disease, hypertension.         Past Medical History:  Diagnosis Date  . Abnormal ankle brachial index (ABI) 12/02/2016  . ADHD (attention deficit hyperactivity disorder)   . AKI (acute kidney injury) ([place])   . Allergy 2020   Can't take amlopine. Causes swelling  . Anxiety   . Aortic atherosclerosis ([place]) 12/02/2016   July 2018  . Arthritis   . Chronic back pain   . Coronary artery disease   . Depression 2020   I think everyone is depressed at this unprecedented time!  . Encounter for HIV (human immunodeficiency virus) test   . Essential (hemorrhagic) thrombocythemia ([place]) 05/08/2020  . GERD (gastroesophageal reflux disease)   . Herpes genitalis in women   . History of stomach ulcers    x7 this year. Bleeding  . HLD (hyperlipidemia)   . HYPERLIPIDEMIA-MIXED   . Hypertension   . Prediabetes 12/02/2016  . Pulmonary hypertension ([place]) 03/09/2018   Echo Oct 2019   . 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preiser), left ([place])   . Subclinical hypothyroidism   . Subclinical hypothyroidism   . Ulcer     Patient Active Problem List   Diagnosis Date Noted  . Essential (hemorrhagic) thrombocythemia ([place]) 05/08/2020  . Osteoarthritis of multiple joints 01/05/2020  . Anxiety and depression 11/14/2019  . 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 ANA positive 09/22/2018  . 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preiser), left ([place]) 09/22/2018  . Tenosynovitis of hand 09/22/2018  . Stomach irritation   . Pulmonary hypertension ([place]) 03/09/2018  . Unstable angina ([place]) 01/23/2018  . Coronary artery calcification seen on CT scan 12/02/2016  . Aortic atherosclerosis ([place]) 12/02/2016  . Abnormal ankle brachial index (ABI) 12/02/2016  . Prediabetes 12/02/2016  . History of tobacco use 06/09/2016  . Degenerative arthritis of knee, bilateral 12/27/2015  . Class 2 severe obesity due to excess calories with serious comorbidity and body mass index (BMI) of 36.0 to 36.9 in adult ([place]) 12/27/2015  . Reflux esophagitis   . Polyarthralgia 06/05/2015  . Subclinical hypothyroidism 03/26/2015  . Herpes simplex type 2 infection 03/21/2015  . Rhinitis, allergic 03/21/2015  . ADHD (attention deficit hyperactivity disorder), inattentive type 09/20/2014  . Episodic paroxysmal anxiety disorder 09/20/2014  . History of gastric ulcer 09/20/2014  . HLD (hyperlipidemia) 10/03/2009  . Coronary artery disease involving native coronary artery of native heart with angina pectoris ([place]) 10/02/2009  . Hypertension goal BP (blood pressure) < 140/90 10/02/2009  Past Surgical History:  Procedure Laterality Date  . APPENDECTOMY    . CARDIAC CATHETERIZATION    . CESAREAN SECTION     x2  . COLON SURGERY     interception  . CORONARY ANGIOPLASTY     stents...  2000  . ESOPHAGOGASTRODUODENOSCOPY (EGD) WITH PROPOFOL N/A 09/21/2015   Procedure: ESOPHAGOGASTRODUODENOSCOPY  (EGD) WITH PROPOFOL;  Surgeon: Lucilla Lame, MD;  Location: [facility] ENDOSCOPY;  Service: Endoscopy;  Laterality: N/A;  . ESOPHAGOGASTRODUODENOSCOPY (EGD) WITH PROPOFOL N/A 07/30/2016   Procedure: ESOPHAGOGASTRODUODENOSCOPY (EGD) WITH PROPOFOL;  Surgeon: Jonathon Bellows, MD;  Location: [facility] ENDOSCOPY;  Service: Endoscopy;  Laterality: N/A;  . ESOPHAGOGASTRODUODENOSCOPY (EGD) WITH PROPOFOL N/A 04/23/2018   Procedure: ESOPHAGOGASTRODUODENOSCOPY (EGD) WITH PROPOFOL;  Surgeon: Virgel Manifold, MD;  Location: [facility] ENDOSCOPY;  Service: Endoscopy;  Laterality: N/A;  . ESOPHAGOGASTRODUODENOSCOPY (EGD) WITH PROPOFOL N/A 11/04/2019   Procedure: ESOPHAGOGASTRODUODENOSCOPY (EGD) WITH PROPOFOL;  Surgeon: Lin Landsman, MD;  [facility] ENDOSCOPY;  Service: Gastroenterology;  Laterality: N/A;  . EYE SURGERY  Dec 2021   Droopy eye lids  . LEFT HEART CATH Right 01/25/2018   Procedure: Left Heart Cath and Coronary Angiography;  Surgeon: Dionisio David, MD;  Location: [place] CV LAB;  Service: Cardiovascular;  Laterality: Right;  . LEFT HEART CATH AND CORONARY ANGIOGRAPHY Right 08/12/2016   Procedure: Left Heart Cath and Coronary Angiography;  Surgeon: Dionisio David, MD;  Location: [place] [facility];  Service: Cardiovascular;  Laterality: Right;  . SMALL INTESTINE SURGERY    . TONSILLECTOMY    . TUBAL LIGATION      Prior to Admission medications   Medication Sig Start Date End Date Taking? Authorizing Provider  acyclovir (ZOVIRAX) 400 MG tablet Take 1 tablet (400 mg total) by mouth 2 (two) times daily. 04/20/20   Towanda Malkin, MD  Alirocumab (PRALUENT) 75 MG/ML SOAJ Inject 1 each into the skin every 14 (fourteen) days. 12/09/18   Hubbard Hartshorn, FNP  amphetamine-dextroamphetamine (ADDERALL) 30 MG tablet Take 30 mg by mouth 2 (two) times daily. 12/02/16   Myer Haff, MD  aspirin EC 81 MG tablet Take 81 mg by mouth daily.    [provider]  cholecalciferol (VITAMIN D3) 25 MCG (1000  UNIT) tablet Take 1,000 Units by mouth daily.    [provider]  cyanocobalamin 1000 MCG tablet Take 1,000 mcg by mouth daily.    [provider]  diazepam (VALIUM) 10 MG tablet Take 10 mg by mouth 3 (three) times daily as needed.    Myer Haff, MD  fluticasone (FLONASE) 50 MCG/ACT nasal spray Place 2 sprays into both nostrils daily as needed for allergies. 12/12/19   Towanda Malkin, MD  metoprolol tartrate (LOPRESSOR) 50 MG tablet Take 1 tablet (50 mg total) by mouth 2 (two) times daily. 03/13/20   Towanda Malkin, MD  nystatin (MYCOSTATIN/NYSTOP) powder Apply topically 2 (two) times daily. 05/08/20   Steele Sizer, MD  pantoprazole (PROTONIX) 40 MG tablet Take 1 tablet (40 mg total) by mouth daily. 05/08/20   Steele Sizer, MD    Allergies Amlodipine and Statins  Family History  Problem Relation Age of Onset  . Diabetes Mother   . CVA Mother   . Heart disease Father   . Heart disease Sister   . Breast cancer Sister   . Heart disease Brother   . Heart disease Sister   . Heart disease Sister   . Heart disease Sister   . Heart disease Brother   .  Heart disease Brother   . Heart disease Brother   . Heart disease Brother   . Healthy Daughter   . Healthy Daughter     Social History Social History   Tobacco Use  . Smoking status: Former Smoker    Packs/day: 1.00    Years: 30.00    Pack years: 30.00    Types: Cigarettes    Quit date: 2006    Years since quitting: 16.2  . Smokeless tobacco: Never Used  . Tobacco comment: I quit smoking cigarettes  Vaping Use  . Vaping Use: Never used  Substance Use Topics  . Alcohol use: Yes    Alcohol/week: 1.0 standard drink    Types: 1 Glasses of wine per week    Comment: occasionally   . Drug use: No     Review of Systems  Constitutional: Positive fever/chills.  Positive for body aches Eyes: No visual changes. No discharge ENT: Positive for nasal congestion and sore throat Cardiovascular: no  chest pain. Respiratory: Positive cough. No SOB. Gastrointestinal: No abdominal pain.  Positive for nausea and one episode of emesis.  Intermittent diarrhea and constipation times a month. Genitourinary: Negative for dysuria. No hematuria Musculoskeletal: Negative for musculoskeletal pain. Skin: Negative for rash, abrasions, lacerations, ecchymosis. Neurological: Negative for headaches, focal weakness or numbness.  10 System ROS otherwise negative.  ____________________________________________   PHYSICAL EXAM:  VITAL SIGNS: ED Triage Vitals  Enc Vitals Group     BP 06/23/20 1551 130/76     Pulse Rate 06/23/20 1551 84     Resp 06/23/20 1656 19     Temp 06/23/20 1551 100.2 F (37.9 C)     Temp Source 06/23/20 1551 Oral     SpO2 06/23/20 1551 91 %     Weight 06/23/20 1556 213 lb (96.6 kg)     Height 06/23/20 1556 5\' 5"  (1.651 m)     Head Circumference --      Peak Flow --      Pain Score 06/23/20 1556 8     Pain Loc --      Pain Edu? --      Excl. in [place]? --      Constitutional: Alert and oriented. Well appearing and in no acute distress. Eyes: Conjunctivae are normal. PERRL. EOMI. Head: Atraumatic. ENT:      Ears: EACs and TMs unremarkable bilaterally      Nose: No congestion/rhinnorhea.      Mouth/Throat: Mucous membranes are moist.  Neck: No stridor.  Neck is supple full range of motion Hematological/Lymphatic/Immunilogical: No cervical lymphadenopathy. Cardiovascular: Normal rate, regular rhythm. Normal S1 and S2.  Good peripheral circulation. Respiratory: Normal respiratory effort without tachypnea or retractions. Lungs CTAB. Good air entry to the bases with no decreased or absent breath sounds. Gastrointestinal: Bowel sounds 4 quadrants. Soft and nontender to palpation. No guarding or rigidity. No palpable masses. No distention. No CVA tenderness. Musculoskeletal: Full range of motion to all extremities. No gross deformities appreciated. Neurologic:  Normal speech  and language. No gross focal neurologic deficits are appreciated.  Skin:  Skin is warm, dry and intact. No rash noted. Psychiatric: Mood and affect are normal. Speech and behavior are normal. Patient exhibits appropriate insight and judgement.   ____________________________________________   LABS (all labs ordered are listed, but only abnormal results are displayed)  Labs Reviewed  CBC WITH DIFFERENTIAL/PLATELET - Abnormal; Notable for the following components:      Result Value   WBC 15.3 (*)  Neutro Abs 12.4 (*)    All other components within normal limits  COMPREHENSIVE METABOLIC PANEL - Abnormal; Notable for the following components:   Glucose, Bld 128 (*)    Calcium 8.2 (*)    All other components within normal limits  LACTIC ACID, PLASMA - Abnormal; Notable for the following components:   Lactic Acid, Venous 2.2 (*)    All other components within normal limits  URINALYSIS, COMPLETE (UACMP) WITH MICROSCOPIC - Abnormal; Notable for the following components:   Color, Urine COLORLESS (*)    APPearance CLEAR (*)    Specific Gravity, Urine 1.001 (*)    Hgb urine dipstick SMALL (*)    All other components within normal limits  RESP PANEL BY RT-PCR (FLU A&B, COVID) ARPGX2  CULTURE, BLOOD (ROUTINE X 2)  CULTURE, BLOOD (ROUTINE X 2)  LACTIC ACID, PLASMA  PROCALCITONIN  LIPASE, BLOOD   ____________________________________________  EKG   ____________________________________________  RADIOLOGY I personally viewed and evaluated these images as part of my medical decision making, as well as reviewing the written report by the radiologist.  ED Provider Interpretation: No acute cardiopulmonary abnormality on chest x-ray  DG Chest Portable 1 View  Result Date: 06/23/2020 CLINICAL DATA:  Shortness of breath EXAM: PORTABLE CHEST 1 VIEW COMPARISON:  12/12/2019 FINDINGS: The heart size and mediastinal contours are within normal limits. Both lungs are clear. The visualized skeletal  structures are unremarkable. IMPRESSION: No acute abnormality of the lungs in AP portable projection. Electronically Signed   By: Eddie Candle M.D.   On: 06/23/2020 17:46    ____________________________________________    PROCEDURES  Procedure(s) performed:    Procedures    Medications  acetaminophen (TYLENOL) tablet 650 mg (650 mg Oral Given 06/23/20 1743)  sodium chloride 0.9 % bolus 1,000 mL (0 mLs Intravenous Stopped 06/23/20 2024)     ____________________________________________   INITIAL IMPRESSION / ASSESSMENT AND PLAN / ED COURSE  Pertinent labs & imaging results that were available during my care of the patient were reviewed by me and considered in my medical decision making (see chart for details).  Review of the [place] CSRS was performed in accordance of the [place] prior to dispensing any controlled drugs.           Patient's diagnosis is consistent with respiratory failure, viral illness.  Patient presented to the emergency department complaining of URI symptoms that started today.  She had fevers and chills, nasal congestion, sore throat, cough or shortness of breath.  Patient had had some ongoing GI complaints over the last month that were unchanged.  Patient denies any chest pain.  She endorsed recent exposure to Covid with 2 family members.  Again symptoms began today.  Temp max at home was 102.2 F.  Overall physical exam was reassuring.  Patient's vitals were relatively stable at rest, however on ambulation her oxygen saturation fell to 83%.  Patient remained slightly hypoxic in the upper 80s while supine and patient was placed on 2 L of oxygen.  Patient does not wear oxygen at home.  Chest x-ray without acute findings.  At this time with hypoxia with ambulation I feel the patient will require admission.  I discussed the patient with the hospitalist service who agrees to admit at this time..     ____________________________________________  FINAL CLINICAL  IMPRESSION(S) / ED DIAGNOSES  Final diagnoses:  Hypoxia  Upper respiratory tract infection, unspecified type      NEW MEDICATIONS STARTED DURING THIS VISIT:  ED Discharge Orders  None          This chart was dictated using voice recognition software/Dragon. Despite best efforts to proofread, errors can occur which can change the meaning. Any change was purely unintentional.    Cuthriell, Jarome Lamas 06/23/20 2125    Duffy Bruce, MD 06/23/20 2142    Duffy Bruce, MD  ADDENDUM: Chart and patient active problem list was changed to reflect that patient did not report a history of alcohol dependence or abuse. This had been erroneously added to her active problem list by a prior provider, and the current problem list was updated to remove this to more accurately reflect her history.     Duffy Bruce, MD 07/09/20 [phone]

## 2020-06-23 NOTE — ED Notes (Signed)
IV attempted x2 without success. Alternate RN to try.

## 2020-06-24 DIAGNOSIS — A419 Sepsis, unspecified organism: Secondary | ICD-10-CM

## 2020-06-24 DIAGNOSIS — R6520 Severe sepsis without septic shock: Secondary | ICD-10-CM | POA: Diagnosis not present

## 2020-06-24 LAB — LACTIC ACID, PLASMA: Lactic Acid, Venous: 0.9 mmol/L (ref 0.5–1.9)

## 2020-06-24 LAB — CBC
HCT: 35.6 % — ABNORMAL LOW (ref 36.0–46.0)
Hemoglobin: 11.3 g/dL — ABNORMAL LOW (ref 12.0–15.0)
MCH: 31.1 pg (ref 26.0–34.0)
MCHC: 31.7 g/dL (ref 30.0–36.0)
MCV: 98.1 fL (ref 80.0–100.0)
Platelets: 269 10*3/uL (ref 150–400)
RBC: 3.63 MIL/uL — ABNORMAL LOW (ref 3.87–5.11)
RDW: 12.5 % (ref 11.5–15.5)
WBC: 13.9 10*3/uL — ABNORMAL HIGH (ref 4.0–10.5)
nRBC: 0 % (ref 0.0–0.2)

## 2020-06-24 LAB — C DIFFICILE QUICK SCREEN W PCR REFLEX
C Diff antigen: NEGATIVE
C Diff interpretation: NOT DETECTED
C Diff toxin: NEGATIVE

## 2020-06-24 LAB — COMPREHENSIVE METABOLIC PANEL
ALT: 13 U/L (ref 0–44)
AST: 18 U/L (ref 15–41)
Albumin: 3.4 g/dL — ABNORMAL LOW (ref 3.5–5.0)
Alkaline Phosphatase: 75 U/L (ref 38–126)
Anion gap: 7 (ref 5–15)
BUN: 11 mg/dL (ref 8–23)
CO2: 25 mmol/L (ref 22–32)
Calcium: 7.6 mg/dL — ABNORMAL LOW (ref 8.9–10.3)
Chloride: 105 mmol/L (ref 98–111)
Creatinine, Ser: 0.59 mg/dL (ref 0.44–1.00)
GFR, Estimated: >60 mL/min (ref >60)
Glucose, Bld: 109 mg/dL — ABNORMAL HIGH (ref 70–99)
Potassium: 3.5 mmol/L (ref 3.5–5.1)
Sodium: 137 mmol/L (ref 135–145)
Total Bilirubin: 0.6 mg/dL (ref 0.3–1.2)
Total Protein: 6.3 g/dL — ABNORMAL LOW (ref 6.5–8.1)

## 2020-06-24 LAB — D-DIMER, QUANTITATIVE: D-Dimer, Quant: 0.62 ug/mL-FEU — ABNORMAL HIGH (ref 0.00–0.50)

## 2020-06-24 LAB — PROTIME-INR
INR: 1.1 (ref 0.8–1.2)
Prothrombin Time: 13.4 seconds (ref 11.4–15.2)

## 2020-06-24 LAB — HIV ANTIBODY (ROUTINE TESTING W REFLEX): HIV Screen 4th Generation wRfx: NONREACTIVE

## 2020-06-24 LAB — PROCALCITONIN: Procalcitonin: 0.12 ng/mL

## 2020-06-24 LAB — TROPONIN I (HIGH SENSITIVITY)
Troponin I (High Sensitivity): 6 ng/L (ref <18)
Troponin I (High Sensitivity): 8 ng/L (ref <18)

## 2020-06-24 MED ORDER — ACETAMINOPHEN 325 MG PO TABS
650.0000 mg | ORAL_TABLET | Freq: Four times a day (QID) | ORAL | Status: DC | PRN
  Administered 2020-06-24 – 2020-06-25 (×4): 650 mg via ORAL
  Filled 2020-06-24 (×3): qty 2

## 2020-06-24 MED ORDER — METOPROLOL TARTRATE 25 MG PO TABS
25.0000 mg | ORAL_TABLET | Freq: Two times a day (BID) | ORAL | Status: DC
  Administered 2020-06-24: 20:00:00 25 mg via ORAL
  Filled 2020-06-24: qty 1

## 2020-06-24 MED ORDER — AMPHETAMINE-DEXTROAMPHETAMINE 10 MG PO TABS
30.0000 mg | ORAL_TABLET | Freq: Two times a day (BID) | ORAL | Status: DC
  Administered 2020-06-24 – 2020-06-26 (×5): 30 mg via ORAL
  Filled 2020-06-24 (×5): qty 3

## 2020-06-24 MED ORDER — MORPHINE SULFATE (PF) 2 MG/ML IV SOLN
1.0000 mg | Freq: Once | INTRAVENOUS | Status: AC
Start: 2020-06-24 — End: 2020-06-24
  Administered 2020-06-24: 06:00:00 1 mg via INTRAVENOUS
  Filled 2020-06-24: qty 1

## 2020-06-24 MED ORDER — SODIUM CHLORIDE 0.9 % IV SOLN
2.0000 g | Freq: Three times a day (TID) | INTRAVENOUS | Status: DC
  Administered 2020-06-24 – 2020-06-25 (×4): 2 g via INTRAVENOUS
  Filled 2020-06-24 (×6): qty 2

## 2020-06-24 MED ORDER — VANCOMYCIN HCL 1500 MG/300ML IV SOLN
1500.0000 mg | INTRAVENOUS | Status: DC
  Administered 2020-06-25: 1500 mg via INTRAVENOUS
  Filled 2020-06-24: qty 300

## 2020-06-24 NOTE — Progress Notes (Addendum)
Pharmacy Antibiotic Note  Linda Mejia is a 65 y.o. female admitted on 06/23/2020 with sepsis from unknown source.  Pharmacy has been consulted for Cefepime and Vancomycin dosing.  Pt also ordered Flagyl.  Plan: Ordered Cefepime 2 gm per indication and renal fxn.  Pt ordered LD Vancomycin 2500 mg per wt: 96.6 kg Vancomycin 1500 mg IV Q 24 hrs.  Goal AUC 400-550. Expected AUC: 534.0 SCr used: 0.8 (Actual 0.59), Vd 0.5 (BMI 35.45)  Pharmacy will continue to follow and adjust abx per renal fxn and order Vanc levels when warranted.   Height: 5\' 5"  (165.1 cm) Weight: 96.6 kg (213 lb) IBW/kg (Calculated) : 57  Temp (24hrs), Avg:99.1 F (37.3 C), Min:98.2 F (36.8 C), Max:100.2 F (37.9 C)  Recent Labs  Lab 06/23/20 1559 06/23/20 1723 06/23/20 2233 06/24/20 0023  WBC 15.3*  --   --  13.9*  CREATININE 0.73  --   --  0.59  LATICACIDVEN  --  1.2  2.2* 1.3  --     Estimated Creatinine Clearance: 82.7 mL/min (by C-G formula based on SCr of 0.59 mg/dL).    Allergies  Allergen Reactions  . Amlodipine     Leg swelling  . Statins Other (See Comments)    Antimicrobials this admission: 03/13 Cefepime >>  03/13 Vancomcyin >>  03/13 Flagyl >>  Microbiology results: 03/12 BCx: Pending 03/12 GI Panel: Pending   Thank you for allowing pharmacy to be a part of this patient's care.  Renda Rolls, PharmD, [facility] 06/24/2020 1:13 AM

## 2020-06-24 NOTE — Plan of Care (Signed)
Plan of care progressing, no acute changes this shift.

## 2020-06-24 NOTE — Progress Notes (Addendum)
Progress Note    Linda Mejia  JKK:[id number] DOB: 12/08/1955  DOA: 06/23/2020 PCP: Steele Sizer, MD    Brief Narrative:     Medical records reviewed and are as summarized below:  Linda Mejia is an 65 y.o. female with medical history significant for anxiety, arthritis, CAD, history of duodenitis, erosive gastritis, hypertension, irregular Z-line of the esophagus, history of Helicobacter pylori infection, obesity, hypertension, rheumatoid arthritis, GERD, presented to the emergency department for chief concerns of fever, nausea, vomiting, diarrhea.  Assessment/Plan:   Principal Problem:   Severe sepsis (HCC) Active Problems:   ADHD (attention deficit hyperactivity disorder), inattentive type   Hypertension goal BP (blood pressure) < 140/90   HLD (hyperlipidemia)   Episodic paroxysmal anxiety disorder   History of gastric ulcer   Herpes simplex type 2 infection   Polyarthralgia   Class 2 severe obesity due to excess calories with serious comorbidity and body mass index (BMI) of 36.0 to 36.9 in adult [facility])   History of tobacco use   ANA positive   Anxiety and depression   Intractable vomiting with nausea    Severe sepsis-source work-up in progress -WBC 15.3, lactic acid 2.2, temperature of 102.2 at home status post two Tylenols at home, acute hypoxia -Query idiopathic C. difficile colitis- stool studies pending -UA unremarkable -Blood cultures x2 -Procalcitonin was negative initally- trend -C. difficile panel, GI panel -Lactic x2 more labs ordered, TSH ordered -CT abdomen and pelvis with contrast ordered -Sepsis antibiotic for unknown source initiated: Cefepime and vancomycin per pharmacy, metronidazole -As patient was recently exposed to 2 family members diagnosed with COVID-19, would recommend that if she does not improve can retest her for COVID-19 in 1 to 2 days--- she is vaccinated   GERD-history of H. pylori infection -Hemoglobin hematocrit is stable and at her  baseline -trend h/h   HSV 2 infection-resumed acyclovir 400 mg twice daily   Anxiety-resumed home diazepam 10 mg every 8 hours as needed for anxiety  Depression -denied being on Celexa 20 mg daily so this was d/c'd Patient denies alcohol use -CIWA protocol initiated   ADHD-Adderall 30 mg tablet p.o. twice daily resumed   Hypertension -metoprolol tartrate 50 mg resumed at a lower dose   History of polymyalgia rheumatica-outpatient follow-up  obesity Body mass index is 35.45 kg/m.   Subpleural lung nodule: will need follow up CT scan in 6 months  Family Communication/Anticipated D/C date and plan/Code Status   DVT prophylaxis: Lovenox ordered. Code Status: Full Code.  Disposition Plan: Status is: Observation  The patient will require care spanning > 2 midnights and should be moved to inpatient because: Inpatient level of care appropriate due to severity of illness  Dispo: The patient is from: Home              Anticipated d/c is to: Home              Patient currently is not medically stable to d/c.   Difficult to place patient No    Medical Consultants:   None.    Subjective:   C/o hurting all over  Objective:    Vitals:   06/23/20 2245 06/24/20 0011 06/24/20 0512 06/24/20 0900  BP: (!) 118/59 121/89 117/82 119/86  Pulse: 77 75 89 90  Resp: 15 16    Temp:  98.2 F (36.8 C) 98.4 F (36.9 C) 98.4 F (36.9 C)  TempSrc:  Oral Oral Oral  SpO2: 93% 96% 95% 95%  Weight:  Height:        Intake/Output Summary (Last 24 hours) at 06/24/2020 1149 Last data filed at 06/24/2020 0949 Gross per 24 hour  Intake 1750.94 ml  Output --  Net 1750.94 ml   Filed Weights   06/23/20 1556  Weight: 96.6 kg    Exam:  General: Appearance:    Obese female who appears uncomfortable     Lungs:     respirations unlabored  Heart:    Normal heart rate. Normal rhythm. No murmurs, rubs, or gallops.   MS:   All extremities are intact.   Neurologic:   Awake, alert,  oriented x 3. No apparent focal neurological           defect.     Data Reviewed:   I have personally reviewed following labs and imaging studies:  Labs: Labs show the following:   Basic Metabolic Panel: Recent Labs  Lab 06/23/20 1559 06/24/20 0023  NA 135 137  K 4.2 3.5  CL 99 105  CO2 25 25  GLUCOSE 128* 109*  BUN 14 11  CREATININE 0.73 0.59  CALCIUM 8.2* 7.6*   GFR Estimated Creatinine Clearance: 82.7 mL/min (by C-G formula based on SCr of 0.59 mg/dL). Liver Function Tests: Recent Labs  Lab 06/23/20 1559 06/24/20 0023  AST 27 18  ALT 14 13  ALKPHOS 82 75  BILITOT 0.7 0.6  PROT 7.4 6.3*  ALBUMIN 4.0 3.4*   Recent Labs  Lab 06/23/20 July 13, 2231  LIPASE 26   No results for input(s): AMMONIA in the last 168 hours. Coagulation profile Recent Labs  Lab 06/24/20 0023  INR 1.1    CBC: Recent Labs  Lab 06/23/20 1559 06/24/20 0023  WBC 15.3* 13.9*  NEUTROABS 12.4*  --   HGB 13.2 11.3*  HCT 40.6 35.6*  MCV 96.4 98.1  PLT 322 269   Cardiac Enzymes: No results for input(s): CKTOTAL, CKMB, CKMBINDEX, TROPONINI in the last 168 hours. BNP (last 3 results) No results for input(s): PROBNP in the last 8760 hours. CBG: No results for input(s): GLUCAP in the last 168 hours. D-Dimer: Recent Labs    06/24/20 0023  DDIMER 0.62*   Hgb A1c: No results for input(s): HGBA1C in the last 72 hours. Lipid Profile: No results for input(s): CHOL, HDL, LDLCALC, TRIG, CHOLHDL, LDLDIRECT in the last 72 hours. Thyroid function studies: Recent Labs    06/23/20 Jul 13, 2231  TSH 0.892   Anemia work up: No results for input(s): VITAMINB12, FOLATE, FERRITIN, TIBC, IRON, RETICCTPCT in the last 72 hours. Sepsis Labs: Recent Labs  Lab 06/23/20 1559 06/23/20 1723 06/23/20 07-13-31 06/24/20 0023  PROCALCITON  --  <0.10  --  0.12  WBC 15.3*  --   --  13.9*  LATICACIDVEN  --  1.2  2.2* 1.3 0.9    Microbiology Recent Results (from the past 240 hour(s))  Resp Panel by RT-PCR (Flu  A&B, Covid) Nasopharyngeal Swab     Status: None   Collection Time: 06/23/20  4:57 PM   Specimen: Nasopharyngeal Swab; Nasopharyngeal(NP) swabs in vial transport medium  Result Value Ref Range Status   SARS Coronavirus 2 by RT PCR NEGATIVE NEGATIVE Final    Comment: (NOTE) SARS-CoV-2 target nucleic acids are NOT DETECTED.  The SARS-CoV-2 RNA is generally detectable in upper respiratory specimens during the acute phase of infection. The lowest concentration of SARS-CoV-2 viral copies this assay can detect is 138 copies/mL. A negative result does not preclude SARS-Cov-2 infection and should not be used as the  sole basis for treatment or other patient management decisions. A negative result may occur with  improper specimen collection/handling, submission of specimen other than nasopharyngeal swab, presence of viral mutation(s) within the areas targeted by this assay, and inadequate number of viral copies(<138 copies/mL). A negative result must be combined with clinical observations, patient history, and epidemiological information. The expected result is Negative.  Fact Sheet for Patients:  EntrepreneurPulse.com.au  Fact Sheet for Healthcare Providers:  IncredibleEmployment.be  This test is no t yet approved or cleared by the [place] FDA and  has been authorized for detection and/or diagnosis of SARS-CoV-2 by FDA under an Emergency Use Authorization (EUA). This EUA will remain  in effect (meaning this test can be used) for the duration of the COVID-19 declaration under Section 564(b)(1) of the Act, 21 U.S.C.section 360bbb-3(b)(1), unless the authorization is terminated  or revoked sooner.       Influenza A by PCR NEGATIVE NEGATIVE Final   Influenza B by PCR NEGATIVE NEGATIVE Final    Comment: (NOTE) The Xpert Xpress SARS-CoV-2/FLU/RSV plus assay is intended as an aid in the diagnosis of influenza from Nasopharyngeal swab specimens  and should not be used as a sole basis for treatment. Nasal washings and aspirates are unacceptable for Xpert Xpress SARS-CoV-2/FLU/RSV testing.  Fact Sheet for Patients: EntrepreneurPulse.com.au  Fact Sheet for Healthcare Providers: IncredibleEmployment.be  This test is not yet approved or cleared by the [place] FDA and has been authorized for detection and/or diagnosis of SARS-CoV-2 by FDA under an Emergency Use Authorization (EUA). This EUA will remain in effect (meaning this test can be used) for the duration of the COVID-19 declaration under Section 564(b)(1) of the Act, 21 U.S.C. section 360bbb-3(b)(1), unless the authorization is terminated or revoked.  Performed at [facility], [address] [place], [place] [address]   Blood culture (routine x 2)     Status: None (Preliminary result)   Collection Time: 06/23/20  5:23 PM   Specimen: BLOOD  Result Value Ref Range Status   Specimen Description BLOOD RIGHT ARM  Final   Special Requests   Final    BOTTLES DRAWN AEROBIC AND ANAEROBIC Blood Culture adequate volume   Culture   Final    NO GROWTH < 24 HOURS Performed at [facility], [address], [place], [place] [address]    Report Status PENDING  Incomplete  Blood culture (routine x 2)     Status: None (Preliminary result)   Collection Time: 06/23/20  5:23 PM   Specimen: BLOOD  Result Value Ref Range Status   Specimen Description BLOOD RIGHT ARM  Final   Special Requests   Final    BOTTLES DRAWN AEROBIC AND ANAEROBIC Blood Culture results may not be optimal due to an excessive volume of blood received in culture bottles   Culture   Final    NO GROWTH < 12 HOURS Performed at [facility], [address], [place], [place] [address]    Report Status PENDING  Incomplete    Procedures and diagnostic studies:  CT ABDOMEN PELVIS W CONTRAST  Result Date: 06/23/2020 CLINICAL DATA:  Abdominal  pain, fever Infectious gastroenteritis or colitis Abdominal pain, acute, nonlocalized Fever, 102.2, diarrhea x 1 week, wbc 15.3, lactic acid 2.2 EXAM: CT ABDOMEN AND PELVIS WITH CONTRAST TECHNIQUE: Multidetector CT imaging of the abdomen and pelvis was performed using the standard protocol following bolus administration of intravenous contrast. CONTRAST:  189mL OMNIPAQUE IOHEXOL 300 MG/ML  SOLN COMPARISON:  CT  abdomen pelvis 11/03/2019, CT angio chest 11/03/2019. FINDINGS: Lower chest: Interval development of an 8 mm subpleural paravertebral nodular-like density within the left lower lobe with associated subjacent atelectasis/scarring. Coronary artery calcifications. Hepatobiliary: The hepatic parenchyma is diffusely hypodense compared to the splenic parenchyma consistent with fatty infiltration. No focal liver abnormality. No gallstones, gallbladder wall thickening, or pericholecystic fluid. No biliary dilatation. Pancreas: No focal lesion. Normal pancreatic contour. No surrounding inflammatory changes. No main pancreatic ductal dilatation. Spleen: Normal in size without focal abnormality. Adrenals/Urinary Tract: No adrenal nodule bilaterally. Bilateral kidneys enhance symmetrically. Subcentimeter hypodensities are too small to characterize. No hydronephrosis. No hydroureter. The urinary bladder is unremarkable. On delayed imaging, there is no urothelial wall thickening and there are no filling defects in the opacified portions of the bilateral collecting systems or ureters. Stomach/Bowel: Stomach is within normal limits. No evidence of bowel wall thickening or dilatation. Possible second portion of the duodenum diverticula. The lumen of the cecum is fluid-filled. No pericolonic fat stranding. No pneumatosis. Status post appendectomy. Vascular/Lymphatic: Infrarenal focal dilatation of the aorta measuring up to 2.7 cm. Otherwise no abdominal aorta or iliac aneurysm. Moderate to severe calcified and noncalcified  atherosclerotic plaque of the aorta and its branches. No abdominal, pelvic, or inguinal lymphadenopathy. Reproductive: Uterus and bilateral adnexa are unremarkable. Other: No intraperitoneal free fluid. No intraperitoneal free gas. No organized fluid collection. Musculoskeletal: No abdominal wall hernia or abnormality. No suspicious lytic or blastic osseous lesions. No acute displaced fracture. Multilevel mild degenerative changes of the spine. Grade 1 anterolisthesis of L5-4 on L5. IMPRESSION: 1. Fluid-filled cecal lumen suggestive of a fast transition state. 2. 8 mm subpleural paravertebral nodular-like density within the left lower lobe with associated subjacent atelectasis/scarring. Non-contrast chest CT at 6-12 months is recommended. If the nodule is stable at time of repeat CT, then future CT at 18-24 months (from today's scan) is considered optional for low-risk patients, but is recommended for high-risk patients. This recommendation follows the consensus statement: Guidelines for Management of Incidental Pulmonary Nodules Detected on CT Images: From the Fleischner Society 2017; Radiology 2017; 284:228-243. 3.  Aortic Atherosclerosis (ICD10-I70.0). Electronically Signed   By: Iven Finn M.D.   On: 06/23/2020 22:48   DG Chest Portable 1 View  Result Date: 06/23/2020 CLINICAL DATA:  Shortness of breath EXAM: PORTABLE CHEST 1 VIEW COMPARISON:  12/12/2019 FINDINGS: The heart size and mediastinal contours are within normal limits. Both lungs are clear. The visualized skeletal structures are unremarkable. IMPRESSION: No acute abnormality of the lungs in AP portable projection. Electronically Signed   By: Eddie Candle M.D.   On: 06/23/2020 17:46    Medications:    acyclovir  400 mg Oral BID   amphetamine-dextroamphetamine  30 mg Oral BID   aspirin EC  81 mg Oral Daily   enoxaparin (LOVENOX) injection  0.5 mg/kg Subcutaneous E95M   folic acid  1 mg Oral Daily   metoprolol tartrate  50 mg Oral BID    multivitamin with minerals  1 tablet Oral Daily   pantoprazole  40 mg Oral BID   thiamine  100 mg Oral Daily   Or   thiamine  100 mg Intravenous Daily   cyanocobalamin  1,000 mcg Oral Daily   Continuous Infusions:  ceFEPime (MAXIPIME) IV 2 g (06/24/20 0949)   metronidazole 150 mL/hr at 06/24/20 0506   [START ON 06/25/2020] vancomycin       LOS: 0 days   Geradine Girt  [facility]   How to contact  the [facility] Attending or Consulting provider [place] or covering provider during after hours [place], for this patient?  Check the care team in [facility] and look for a) attending/consulting [facility] provider listed and b) the [facility] team listed Log into [email or web address] and use [facility] universal password to access. If you do not have the password, please contact the hospital operator. Locate the [facility] provider you are looking for under [facility] and page to a number that you can be directly reached. If you still have difficulty reaching the provider, please page the [facility] (Director on Call) for the Hospitalists listed on amion for assistance.  06/24/2020, 11:49 AM

## 2020-06-24 NOTE — Progress Notes (Addendum)
Patient alert and oriented, complained of pain, morphine given x1 with improvement.

## 2020-06-25 DIAGNOSIS — A419 Sepsis, unspecified organism: Secondary | ICD-10-CM | POA: Diagnosis not present

## 2020-06-25 DIAGNOSIS — R6520 Severe sepsis without septic shock: Secondary | ICD-10-CM | POA: Diagnosis not present

## 2020-06-25 LAB — BASIC METABOLIC PANEL
Anion gap: 5 (ref 5–15)
BUN: 12 mg/dL (ref 8–23)
CO2: 26 mmol/L (ref 22–32)
Calcium: 7.6 mg/dL — ABNORMAL LOW (ref 8.9–10.3)
Chloride: 109 mmol/L (ref 98–111)
Creatinine, Ser: 0.65 mg/dL (ref 0.44–1.00)
GFR, Estimated: >60 mL/min (ref >60)
Glucose, Bld: 95 mg/dL (ref 70–99)
Potassium: 4 mmol/L (ref 3.5–5.1)
Sodium: 140 mmol/L (ref 135–145)

## 2020-06-25 LAB — GASTROINTESTINAL PANEL BY PCR, STOOL (REPLACES STOOL CULTURE)

## 2020-06-25 LAB — CBC
HCT: 32.2 % — ABNORMAL LOW (ref 36.0–46.0)
Hemoglobin: 10.3 g/dL — ABNORMAL LOW (ref 12.0–15.0)
MCH: 31.9 pg (ref 26.0–34.0)
MCHC: 32 g/dL (ref 30.0–36.0)
MCV: 99.7 fL (ref 80.0–100.0)
Platelets: 223 10*3/uL (ref 150–400)
RBC: 3.23 MIL/uL — ABNORMAL LOW (ref 3.87–5.11)
RDW: 12.5 % (ref 11.5–15.5)
WBC: 5.5 10*3/uL (ref 4.0–10.5)
nRBC: 0 % (ref 0.0–0.2)

## 2020-06-25 LAB — RESPIRATORY PANEL BY PCR

## 2020-06-25 LAB — SARS CORONAVIRUS 2 (TAT 6-24 HRS): SARS Coronavirus 2: NEGATIVE

## 2020-06-25 LAB — OCCULT BLOOD X 1 CARD TO LAB, STOOL: Fecal Occult Bld: NEGATIVE

## 2020-06-25 MED ORDER — RISAQUAD PO CAPS
2.0000 | ORAL_CAPSULE | Freq: Every day | ORAL | Status: DC
  Administered 2020-06-25 – 2020-06-26 (×2): 2 via ORAL
  Filled 2020-06-25 (×2): qty 2

## 2020-06-25 MED ORDER — METOPROLOL TARTRATE 25 MG PO TABS
12.5000 mg | ORAL_TABLET | Freq: Two times a day (BID) | ORAL | Status: DC
  Filled 2020-06-25: qty 1

## 2020-06-25 MED ORDER — ASPIRIN-ACETAMINOPHEN-CAFFEINE 250-250-65 MG PO TABS
1.0000 | ORAL_TABLET | Freq: Once | ORAL | Status: AC
  Administered 2020-06-25: 1 via ORAL
  Filled 2020-06-25: qty 1

## 2020-06-25 MED ORDER — METOPROLOL TARTRATE 25 MG PO TABS
25.0000 mg | ORAL_TABLET | Freq: Two times a day (BID) | ORAL | Status: DC
  Administered 2020-06-25 – 2020-06-26 (×2): 25 mg via ORAL
  Filled 2020-06-25 (×2): qty 1

## 2020-06-25 NOTE — Progress Notes (Addendum)
Progress Note    Linda Mejia  DGL:[id number] DOB: [birth year]  DOA: 06/23/2020 PCP: Steele Sizer, MD    Brief Narrative:     Medical records reviewed and are as summarized below:  Linda Mejia is an 65 y.o. female with medical history significant for anxiety, arthritis, CAD, history of duodenitis, erosive gastritis, hypertension, irregular Z-line of the esophagus, history of Helicobacter pylori infection, obesity, hypertension, rheumatoid arthritis, GERD, presented to the emergency department for chief concerns of fever, nausea, vomiting, diarrhea.    Assessment/Plan:   Principal Problem:   Severe sepsis (HCC) Active Problems:   ADHD (attention deficit hyperactivity disorder), inattentive type   Hypertension goal BP (blood pressure) < 140/90   HLD (hyperlipidemia)   Episodic paroxysmal anxiety disorder   History of gastric ulcer   Herpes simplex type 2 infection   Polyarthralgia   Class 2 severe obesity due to excess calories with serious comorbidity and body mass index (BMI) of 36.0 to 36.9 in adult [facility])   History of tobacco use   ANA positive   Anxiety and depression   Intractable vomiting with nausea   SIR with no source found-source work-up in progress -WBC 15.3, lactic acid 2.2, temperature of 102.2 at home status post two Tylenols at home, acute hypoxia -stool studies negative -UA unremarkable -Blood cultures x2 -Procalcitonin was negative initally- trend -Lactic acid and TSH normal -CT abdomen and pelvis with contrast: with out source  -d/c abx for now -As patient was recently exposed to 2 family members diagnosed with COVID-19, she is vaccinated, recheck swab along with NP for other viruses -does say she has some congestion -upon discussion with pharmacy: pt takes alirocumab (Praluent) q14 days with last dose 3/11 (PCSK9 inhibitor for cholesterol). This does have ADR of diarrhea and flu-like symptoms. -check stool for blood  GERD-history of H. pylori  infection -Hemoglobin hematocrit is stable and at her baseline -trend h/h  HSV 2 infection-resumed acyclovir 400 mg twice daily  Anxiety-resumed home diazepam 10 mg every 8 hours as needed for anxiety  Depression -denied being on Celexa 20 mg daily so this was d/c'd  History of alcohol intoxication though she denies regular p.o. alcohol intake -CIWA protocol initiated  ADHD-Adderall 30 mg tablet p.o. twice daily resumed  Hypertension -on metoprolol tartrate 50 mg resumed at a lower dose due to hypotension  History of polymyalgia rheumatica-outpatient follow-up -? If patient should be changed from praluent  obesity Body mass index is 35.45 kg/m.   Subpleural lung nodule: will need follow up CT scan in 6 months  Family Communication/Anticipated D/C date and plan/Code Status   DVT prophylaxis: Lovenox ordered. Code Status: Full Code.  Disposition Plan: Status is: Observation  The patient will require care spanning > 2 midnights and should be moved to inpatient because: Inpatient level of care appropriate due to severity of illness  Dispo: The patient is from: Home              Anticipated d/c is to: Home              Patient currently is not medically stable to d/c.   Difficult to place patient No    Medical Consultants:    None.    Subjective:   Still not feeling well, c/o loose stools for months- she describes as dark  Objective:    Vitals:   06/25/20 0054 06/25/20 0432 06/25/20 0743 06/25/20 1156  BP: 120/63 (!) 99/55 (!) 103/54 (!) 141/75  Pulse:  85 78 78 94  Resp: 16 16 15 17   Temp: 98.5 F (36.9 C) 99.1 F (37.3 C) 98.2 F (36.8 C) 98.5 F (36.9 C)  TempSrc: Oral Oral Oral Oral  SpO2: 95% 94% 95% 97%  Weight:      Height:        Intake/Output Summary (Last 24 hours) at 06/25/2020 1232 Last data filed at 06/24/2020 1600 Gross per 24 hour  Intake 480 ml  Output --  Net 480 ml   Filed Weights   06/23/20 1556  Weight: 96.6 kg     Exam:  General: Appearance:    Obese female in no acute distress     Lungs:     respirations unlabored  Heart:    Normal heart rate. Normal rhythm. No murmurs, rubs, or gallops.   MS:   All extremities are intact.   Neurologic:   Awake, alert, oriented x 3.      Data Reviewed:   I have personally reviewed following labs and imaging studies:  Labs: Labs show the following:   Basic Metabolic Panel: Recent Labs  Lab 06/23/20 1559 06/24/20 0023 06/25/20 0424  NA 135 137 140  K 4.2 3.5 4.0  CL 99 105 109  CO2 25 25 26   GLUCOSE 128* 109* 95  BUN 14 11 12   CREATININE 0.73 0.59 0.65  CALCIUM 8.2* 7.6* 7.6*   GFR Estimated Creatinine Clearance: 82.7 mL/min (by C-G formula based on SCr of 0.65 mg/dL). Liver Function Tests: Recent Labs  Lab 06/23/20 1559 06/24/20 0023  AST 27 18  ALT 14 13  ALKPHOS 82 75  BILITOT 0.7 0.6  PROT 7.4 6.3*  ALBUMIN 4.0 3.4*   Recent Labs  Lab 06/23/20 07/21/31  LIPASE 26   No results for input(s): AMMONIA in the last 168 hours. Coagulation profile Recent Labs  Lab 06/24/20 0023  INR 1.1    CBC: Recent Labs  Lab 06/23/20 1559 06/24/20 0023 06/25/20 0424  WBC 15.3* 13.9* 5.5  NEUTROABS 12.4*  --   --   HGB 13.2 11.3* 10.3*  HCT 40.6 35.6* 32.2*  MCV 96.4 98.1 99.7  PLT 322 269 223   Cardiac Enzymes: No results for input(s): CKTOTAL, CKMB, CKMBINDEX, TROPONINI in the last 168 hours. BNP (last 3 results) No results for input(s): PROBNP in the last 8760 hours. CBG: No results for input(s): GLUCAP in the last 168 hours. D-Dimer: Recent Labs    06/24/20 0023  DDIMER 0.62*   Hgb A1c: No results for input(s): HGBA1C in the last 72 hours. Lipid Profile: No results for input(s): CHOL, HDL, LDLCALC, TRIG, CHOLHDL, LDLDIRECT in the last 72 hours. Thyroid function studies: Recent Labs    06/23/20 07-21-31  TSH 0.892   Anemia work up: No results for input(s): VITAMINB12, FOLATE, FERRITIN, TIBC, IRON, RETICCTPCT in the  last 72 hours. Sepsis Labs: Recent Labs  Lab 06/23/20 1559 06/23/20 1723 06/23/20 2233 06/24/20 0023 06/25/20 0424  PROCALCITON  --  <0.10  --  0.12  --   WBC 15.3*  --   --  13.9* 5.5  LATICACIDVEN  --  1.2  2.2* 1.3 0.9  --     Microbiology Recent Results (from the past 240 hour(s))  Resp Panel by RT-PCR (Flu A&B, Covid) Nasopharyngeal Swab     Status: None   Collection Time: 06/23/20  4:57 PM   Specimen: Nasopharyngeal Swab; Nasopharyngeal(NP) swabs in vial transport medium  Result Value Ref Range Status   SARS Coronavirus 2 by  RT PCR NEGATIVE NEGATIVE Final    Comment: (NOTE) SARS-CoV-2 target nucleic acids are NOT DETECTED.  The SARS-CoV-2 RNA is generally detectable in upper respiratory specimens during the acute phase of infection. The lowest concentration of SARS-CoV-2 viral copies this assay can detect is 138 copies/mL. A negative result does not preclude SARS-Cov-2 infection and should not be used as the sole basis for treatment or other patient management decisions. A negative result may occur with  improper specimen collection/handling, submission of specimen other than nasopharyngeal swab, presence of viral mutation(s) within the areas targeted by this assay, and inadequate number of viral copies(<138 copies/mL). A negative result must be combined with clinical observations, patient history, and epidemiological information. The expected result is Negative.  Fact Sheet for Patients:  EntrepreneurPulse.com.au  Fact Sheet for Healthcare Providers:  IncredibleEmployment.be  This test is no t yet approved or cleared by the [place] FDA and  has been authorized for detection and/or diagnosis of SARS-CoV-2 by FDA under an Emergency Use Authorization (EUA). This EUA will remain  in effect (meaning this test can be used) for the duration of the COVID-19 declaration under Section 564(b)(1) of the Act, 21 U.S.C.section  360bbb-3(b)(1), unless the authorization is terminated  or revoked sooner.       Influenza A by PCR NEGATIVE NEGATIVE Final   Influenza B by PCR NEGATIVE NEGATIVE Final    Comment: (NOTE) The Xpert Xpress SARS-CoV-2/FLU/RSV plus assay is intended as an aid in the diagnosis of influenza from Nasopharyngeal swab specimens and should not be used as a sole basis for treatment. Nasal washings and aspirates are unacceptable for Xpert Xpress SARS-CoV-2/FLU/RSV testing.  Fact Sheet for Patients: EntrepreneurPulse.com.au  Fact Sheet for Healthcare Providers: IncredibleEmployment.be  This test is not yet approved or cleared by the [place] FDA and has been authorized for detection and/or diagnosis of SARS-CoV-2 by FDA under an Emergency Use Authorization (EUA). This EUA will remain in effect (meaning this test can be used) for the duration of the COVID-19 declaration under Section 564(b)(1) of the Act, 21 U.S.C. section 360bbb-3(b)(1), unless the authorization is terminated or revoked.  Performed at [facility], [address] [place], [place] [address]   Blood culture (routine x 2)     Status: None (Preliminary result)   Collection Time: 06/23/20  5:23 PM   Specimen: BLOOD  Result Value Ref Range Status   Specimen Description BLOOD RIGHT ARM  Final   Special Requests   Final    BOTTLES DRAWN AEROBIC AND ANAEROBIC Blood Culture adequate volume   Culture   Final    NO GROWTH 2 DAYS Performed at [facility], [address], [place], [place] [address]    Report Status PENDING  Incomplete  Blood culture (routine x 2)     Status: None (Preliminary result)   Collection Time: 06/23/20  5:23 PM   Specimen: BLOOD  Result Value Ref Range Status   Specimen Description BLOOD RIGHT ARM  Final   Special Requests   Final    BOTTLES DRAWN AEROBIC AND ANAEROBIC Blood Culture results may not be optimal due to an excessive volume of  blood received in culture bottles   Culture   Final    NO GROWTH 2 DAYS Performed at [facility], [address], [place], [place] [address]    Report Status PENDING  Incomplete  C Difficile Quick Screen w PCR reflex     Status: None   Collection Time: 06/23/20 10:33 PM  Specimen: STOOL  Result Value Ref Range Status   C Diff antigen NEGATIVE NEGATIVE Final   C Diff toxin NEGATIVE NEGATIVE Final   C Diff interpretation No C. difficile detected.  Final    Comment: Performed at [facility], [address] [place], [place] [address]  Gastrointestinal Panel by PCR , Stool     Status: None   Collection Time: 06/23/20 10:33 PM   Specimen: Stool  Result Value Ref Range Status   Campylobacter species NOT DETECTED NOT DETECTED Final   Plesimonas shigelloides NOT DETECTED NOT DETECTED Final   Salmonella species NOT DETECTED NOT DETECTED Final   Yersinia enterocolitica NOT DETECTED NOT DETECTED Final   Vibrio species NOT DETECTED NOT DETECTED Final   Vibrio cholerae NOT DETECTED NOT DETECTED Final   Enteroaggregative E coli (EAEC) NOT DETECTED NOT DETECTED Final   Enteropathogenic E coli (EPEC) NOT DETECTED NOT DETECTED Final   Enterotoxigenic E coli (ETEC) NOT DETECTED NOT DETECTED Final   Shiga like toxin producing E coli (STEC) NOT DETECTED NOT DETECTED Final   Shigella/Enteroinvasive E coli (EIEC) NOT DETECTED NOT DETECTED Final   Cryptosporidium NOT DETECTED NOT DETECTED Final   Cyclospora cayetanensis NOT DETECTED NOT DETECTED Final   Entamoeba histolytica NOT DETECTED NOT DETECTED Final   Giardia lamblia NOT DETECTED NOT DETECTED Final   Adenovirus F40/41 NOT DETECTED NOT DETECTED Final   Astrovirus NOT DETECTED NOT DETECTED Final   Norovirus GI/GII NOT DETECTED NOT DETECTED Final   Rotavirus A NOT DETECTED NOT DETECTED Final   Sapovirus (I, II, IV, and V) NOT DETECTED NOT DETECTED Final    Comment: Performed at [facility], [address]  [place], [place] [address]    Procedures and diagnostic studies:  CT ABDOMEN PELVIS W CONTRAST  Result Date: 06/23/2020 CLINICAL DATA:  Abdominal pain, fever Infectious gastroenteritis or colitis Abdominal pain, acute, nonlocalized Fever, 102.2, diarrhea x 1 week, wbc 15.3, lactic acid 2.2 EXAM: CT ABDOMEN AND PELVIS WITH CONTRAST TECHNIQUE: Multidetector CT imaging of the abdomen and pelvis was performed using the standard protocol following bolus administration of intravenous contrast. CONTRAST:  176mL OMNIPAQUE IOHEXOL 300 MG/ML  SOLN COMPARISON:  CT abdomen pelvis 11/03/2019, CT angio chest 11/03/2019. FINDINGS: Lower chest: Interval development of an 8 mm subpleural paravertebral nodular-like density within the left lower lobe with associated subjacent atelectasis/scarring. Coronary artery calcifications. Hepatobiliary: The hepatic parenchyma is diffusely hypodense compared to the splenic parenchyma consistent with fatty infiltration. No focal liver abnormality. No gallstones, gallbladder wall thickening, or pericholecystic fluid. No biliary dilatation. Pancreas: No focal lesion. Normal pancreatic contour. No surrounding inflammatory changes. No main pancreatic ductal dilatation. Spleen: Normal in size without focal abnormality. Adrenals/Urinary Tract: No adrenal nodule bilaterally. Bilateral kidneys enhance symmetrically. Subcentimeter hypodensities are too small to characterize. No hydronephrosis. No hydroureter. The urinary bladder is unremarkable. On delayed imaging, there is no urothelial wall thickening and there are no filling defects in the opacified portions of the bilateral collecting systems or ureters. Stomach/Bowel: Stomach is within normal limits. No evidence of bowel wall thickening or dilatation. Possible second portion of the duodenum diverticula. The lumen of the cecum is fluid-filled. No pericolonic fat stranding. No pneumatosis. Status post appendectomy. Vascular/Lymphatic: Infrarenal focal  dilatation of the aorta measuring up to 2.7 cm. Otherwise no abdominal aorta or iliac aneurysm. Moderate to severe calcified and noncalcified atherosclerotic plaque of the aorta and its branches. No abdominal, pelvic, or inguinal lymphadenopathy. Reproductive: Uterus and bilateral adnexa are unremarkable. Other: No intraperitoneal  free fluid. No intraperitoneal free gas. No organized fluid collection. Musculoskeletal: No abdominal wall hernia or abnormality. No suspicious lytic or blastic osseous lesions. No acute displaced fracture. Multilevel mild degenerative changes of the spine. Grade 1 anterolisthesis of L5-4 on L5. IMPRESSION: 1. Fluid-filled cecal lumen suggestive of a fast transition state. 2. 8 mm subpleural paravertebral nodular-like density within the left lower lobe with associated subjacent atelectasis/scarring. Non-contrast chest CT at 6-12 months is recommended. If the nodule is stable at time of repeat CT, then future CT at 18-24 months (from today's scan) is considered optional for low-risk patients, but is recommended for high-risk patients. This recommendation follows the consensus statement: Guidelines for Management of Incidental Pulmonary Nodules Detected on CT Images: From the Fleischner Society 2017; Radiology 2017; 284:228-243. 3.  Aortic Atherosclerosis (ICD10-I70.0). Electronically Signed   By: Iven Finn M.D.   On: 06/23/2020 22:48   DG Chest Portable 1 View  Result Date: 06/23/2020 CLINICAL DATA:  Shortness of breath EXAM: PORTABLE CHEST 1 VIEW COMPARISON:  12/12/2019 FINDINGS: The heart size and mediastinal contours are within normal limits. Both lungs are clear. The visualized skeletal structures are unremarkable. IMPRESSION: No acute abnormality of the lungs in AP portable projection. Electronically Signed   By: Eddie Candle M.D.   On: 06/23/2020 17:46    Medications:   . acyclovir  400 mg Oral BID  . amphetamine-dextroamphetamine  30 mg Oral BID  . aspirin EC  81 mg  Oral Daily  . enoxaparin (LOVENOX) injection  0.5 mg/kg Subcutaneous Q24H  . folic acid  1 mg Oral Daily  . metoprolol tartrate  12.5 mg Oral BID  . multivitamin with minerals  1 tablet Oral Daily  . pantoprazole  40 mg Oral BID  . thiamine  100 mg Oral Daily   Or  . thiamine  100 mg Intravenous Daily  . cyanocobalamin  1,000 mcg Oral Daily   Continuous Infusions: . ceFEPime (MAXIPIME) IV Stopped (06/25/20 7371)  . metronidazole Stopped (06/25/20 0610)  . vancomycin 1,500 mg (06/25/20 0626)     LOS: 0 days   Geradine Girt  [facility]   How to contact the [facility] Attending or Consulting provider [place] or covering provider during after hours [place], for this patient?  1. Check the care team in [facility] and look for a) attending/consulting [facility] provider listed and b) the [facility] team listed 2. Log into [email or web address] and use [place]'s universal password to access. If you do not have the password, please contact the hospital operator. 3. Locate the [facility] provider you are looking for under [facility] and page to a number that you can be directly reached. 4. If you still have difficulty reaching the provider, please page the [facility] (Director on Call) for the Hospitalists listed on amion for assistance.  06/25/2020, 12:32 PM

## 2020-06-25 NOTE — Evaluation (Signed)
Physical Therapy Evaluation Patient Details Name: Linda Mejia MRN: [id number] DOB: August 01, 1955 Today's Date: 06/25/2020   History of Present Illness  Pt is a 65 y.o. female with medical history significant for anxiety, arthritis, CAD, history of duodenitis, erosive gastritis, hypertension, irregular Z-line of the esophagus, history of Helicobacter pylori infection, obesity, HSV 2 infection, hypertension, rheumatoid arthritis, GERD, presented to the emergency department for chief concerns of fever, nausea, vomiting, diarrhea.  MD assessment includes: Severe sepsis, anxiety, depression, ADHD, and subpleural lung nodule.    Clinical Impression  Pt was pleasant and motivated to participate during the session.  Pt was Ind with all functional tasks and demonstrated good functional strength, control, and stability throughout the session.  Pt reported no adverse symptoms during the session other than general body aches that did not worsen with activity and with SpO2 and HR WNL during the session.  No skilled PT needs identified at this time.  Will complete PT orders at this time but will reassess pt pending a change in status upon receipt of new PT orders.      Follow Up Recommendations No PT follow up    Equipment Recommendations  None recommended by PT    Recommendations for Other Services       Precautions / Restrictions Precautions Precautions: None Restrictions Weight Bearing Restrictions: No      Mobility  Bed Mobility Overal bed mobility: Independent             General bed mobility comments: Good speed and effort with bed mobility tasks    Transfers Overall transfer level: Independent               General transfer comment: Good eccentric and concentric control and stability  Ambulation/Gait Ambulation/Gait assistance: Independent Gait Distance (Feet): 50 Feet Assistive device: None Gait Pattern/deviations: WFL(Within Functional Limits) Gait velocity: WNL    General Gait Details: Pt steady amb in room only secondary to patient on isolation; steady with start/stops, 180 deg turns, and navigating tight spaces  Stairs            Wheelchair Mobility    Modified Rankin (Stroke Patients Only)       Balance Overall balance assessment: No apparent balance deficits (not formally assessed)                                           Pertinent Vitals/Pain Pain Assessment: 0-10 Pain Score: 8  Pain Location: General body aches and HA Pain Descriptors / Indicators: Aching Pain Intervention(s): Premedicated before session;Monitored during session    Home Living Family/patient expects to be discharged to:: Private residence Living Arrangements: Children Available Help at Discharge: Family;Available PRN/intermittently Type of Home: House Home Access: Stairs to enter Entrance Stairs-Rails: Right Entrance Stairs-Number of Steps: 3 Home Layout: One level Home Equipment: Grab bars - tub/shower      Prior Function Level of Independence: Independent         Comments: Ind amb without an AD, Ind with ADLs, two falls in the last year, one pt slipped on ice and the other pt tripped stepping over baby gait     Hand Dominance        Extremity/Trunk Assessment   Upper Extremity Assessment Upper Extremity Assessment: Overall WFL for tasks assessed    Lower Extremity Assessment Lower Extremity Assessment: Overall WFL for tasks assessed  Communication   Communication: No difficulties  Cognition Arousal/Alertness: Awake/alert Behavior During Therapy: WFL for tasks assessed/performed Overall Cognitive Status: Within Functional Limits for tasks assessed                                        General Comments General comments (skin integrity, edema, etc.): Pt steady with feet together and eyes closed, steady with head turns, able to perform BLE SLS 5-8 sec limited by knee pain in SLS but steady  throughout    Exercises     Assessment/Plan    PT Assessment Patent does not need any further PT services  PT Problem List         PT Treatment Interventions      PT Goals (Current goals can be found in the Care Plan section)  Acute Rehab PT Goals PT Goal Formulation: All assessment and education complete, DC therapy    Frequency     Barriers to discharge        Co-evaluation               AM-PAC PT "6 Clicks" Mobility  Outcome Measure Help needed turning from your back to your side while in a flat bed without using bedrails?: None Help needed moving from lying on your back to sitting on the side of a flat bed without using bedrails?: None Help needed moving to and from a bed to a chair (including a wheelchair)?: None Help needed standing up from a chair using your arms (e.g., wheelchair or bedside chair)?: None Help needed to walk in hospital room?: None Help needed climbing 3-5 steps with a railing? : None 6 Click Score: 24    End of Session   Activity Tolerance: Patient tolerated treatment well Patient left: in bed;with call bell/phone within reach Nurse Communication: Mobility status PT Visit Diagnosis: Muscle weakness (generalized) (M62.81)    Time: 2122-4825 PT Time Calculation (min) (ACUTE ONLY): 18 min   Charges:   PT Evaluation $PT Eval Low Complexity: 1 Low         D. Scott Natallie Ravenscroft PT, DPT 06/25/20, 11:05 AM

## 2020-06-26 DIAGNOSIS — A419 Sepsis, unspecified organism: Secondary | ICD-10-CM | POA: Diagnosis not present

## 2020-06-26 DIAGNOSIS — R6520 Severe sepsis without septic shock: Secondary | ICD-10-CM | POA: Diagnosis not present

## 2020-06-26 LAB — BASIC METABOLIC PANEL
Anion gap: 7 (ref 5–15)
BUN: 7 mg/dL — ABNORMAL LOW (ref 8–23)
CO2: 28 mmol/L (ref 22–32)
Calcium: 8.2 mg/dL — ABNORMAL LOW (ref 8.9–10.3)
Chloride: 106 mmol/L (ref 98–111)
Creatinine, Ser: 0.59 mg/dL (ref 0.44–1.00)
GFR, Estimated: >60 mL/min (ref >60)
Glucose, Bld: 92 mg/dL (ref 70–99)
Potassium: 3.6 mmol/L (ref 3.5–5.1)
Sodium: 141 mmol/L (ref 135–145)

## 2020-06-26 LAB — IRON AND TIBC
Iron: 45 ug/dL (ref 28–170)
Saturation Ratios: 18 % (ref 10.4–31.8)
TIBC: 248 ug/dL — ABNORMAL LOW (ref 250–450)
UIBC: 203 ug/dL

## 2020-06-26 LAB — CBC
HCT: 35.8 % — ABNORMAL LOW (ref 36.0–46.0)
Hemoglobin: 11.8 g/dL — ABNORMAL LOW (ref 12.0–15.0)
MCH: 31.6 pg (ref 26.0–34.0)
MCHC: 33 g/dL (ref 30.0–36.0)
MCV: 96 fL (ref 80.0–100.0)
Platelets: 214 10*3/uL (ref 150–400)
RBC: 3.73 MIL/uL — ABNORMAL LOW (ref 3.87–5.11)
RDW: 12.3 % (ref 11.5–15.5)
WBC: 3.3 10*3/uL — ABNORMAL LOW (ref 4.0–10.5)
nRBC: 0 % (ref 0.0–0.2)

## 2020-06-26 LAB — FERRITIN: Ferritin: 155 ng/mL (ref 11–307)

## 2020-06-26 LAB — VITAMIN B12: Vitamin B-12: 490 pg/mL (ref 180–914)

## 2020-06-26 MED ORDER — RISAQUAD PO CAPS: 2.0000 | ORAL_CAPSULE | Freq: Every day | ORAL | 0 refills | Status: DC

## 2020-06-26 NOTE — Care Management Obs Status (Signed)
[facility] NOTIFICATION   Patient Details  Name: Linda Mejia MRN: [id number] Date of Birth: [birth year]   Medicare Observation Status Notification Given:  Yes    Shelbie Hutching, RN 06/26/2020, 9:42 AM

## 2020-06-26 NOTE — TOC Initial Note (Signed)
Transition of Care [facility]) - Initial/Assessment Note    Patient Details  Name: Linda Mejia MRN: [id number] Date of Birth: Oct 09, 1955  Transition of [facility]) CM/SW Contact:    Shelbie Hutching, RN Phone Number: 06/26/2020, 9:53 AM  Clinical Narrative:                 Patient placed under observation for upper respiratory infection.  RNCM was able to meet with patient at the bedside.  Patient is from home where she lives in an apartment alone.  At discharge patient is going to her daughter's home in [place].  Patient reports that she is living on the second floor and is having trouble with the stairs so she will be moving to just one level.   RNCM received a consult for substance abuse resources.  Patient reports that she does enjoy a glass of wine from time to time but does not have a problem with alcohol.  Patient is very upset that consult was placed for substance abuse and would like for the diagnosis to be taken off of her chart.  RNCM did provide patient with the number for patient experience.  She is very concerned that the emergency room and hospital would assume that she has an alcohol problem.    Patient does need a ride home her daughter does not get off work until 16.  RNCM will set up [facility] when patient is ready for discharge.     Expected Discharge Plan: Home/Self Care Barriers to Discharge: Barriers Resolved   Patient Goals and CMS Choice Patient states their goals for this hospitalization and ongoing recovery are:: Patient is very glad to be going home      Expected Discharge Plan and Services Expected Discharge Plan: Home/Self Care   Discharge Planning Services: CM Consult   Living arrangements for the past 2 months: Apartment Expected Discharge Date: 06/26/20               DME Arranged: N/A DME Agency: NA       HH Arranged: NA [place] Agency: NA        Prior Living Arrangements/Services Living arrangements for the past 2 months: Apartment Lives with::  Self Patient language and need for interpreter reviewed:: Yes Do you feel safe going back to the place where you live?: Yes      Need for Family Participation in Patient Care: Yes (Comment) Care giver support system in place?: Yes (comment) (daughter)   Criminal Activity/Legal Involvement Pertinent to Current Situation/Hospitalization: No - Comment as needed  Activities of Daily Living Home Assistive Devices/Equipment: None ADL Screening (condition at time of admission) Patient's cognitive ability adequate to safely complete daily activities?: Yes Is the patient deaf or have difficulty hearing?: No Does the patient have difficulty seeing, even when wearing glasses/contacts?: No Does the patient have difficulty concentrating, remembering, or making decisions?: No Patient able to express need for assistance with ADLs?: No Does the patient have difficulty dressing or bathing?: No Independently performs ADLs?: Yes (appropriate for developmental age) Does the patient have difficulty walking or climbing stairs?: No Weakness of Legs: None Weakness of Arms/Hands: None  Permission Sought/Granted   Permission granted to share information with : No              Emotional Assessment Appearance:: Appears stated age Attitude/Demeanor/Rapport: Engaged,Charismatic Affect (typically observed): Accepting,Pleasant Orientation: : Oriented to Self,Oriented to Place,Oriented to  Time,Oriented to Situation Alcohol / Substance Use: Not Applicable Psych Involvement: No (comment)  Admission diagnosis:  Hypoxia [R09.02] Intractable vomiting with nausea [R11.2] Upper respiratory tract infection, unspecified type [J06.9] Patient Active Problem List   Diagnosis Date Noted  . Intractable vomiting with nausea 06/23/2020  . Severe sepsis ([place]) 06/23/2020  . Alcohol intoxication, uncomplicated ([place]) 02/40/9735  . Essential (hemorrhagic) thrombocythemia ([place]) 05/08/2020  . Osteoarthritis of multiple  joints 01/05/2020  . Anxiety and depression 11/14/2019  . 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 ANA positive 09/22/2018  . 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preiser), left ([place]) 09/22/2018  . Tenosynovitis of hand 09/22/2018  . Stomach irritation   . Pulmonary hypertension ([place]) 03/09/2018  . Unstable angina ([place]) 01/23/2018  . Coronary artery calcification seen on CT scan 12/02/2016  . Aortic atherosclerosis ([place]) 12/02/2016  . Abnormal ankle brachial index (ABI) 12/02/2016  . Prediabetes 12/02/2016  . History of tobacco use 06/09/2016  . Degenerative arthritis of knee, bilateral 12/27/2015  . Class 2 severe obesity due to excess calories with serious comorbidity and body mass index (BMI) of 36.0 to 36.9 in adult ([place]) 12/27/2015  . Reflux esophagitis   . Polyarthralgia 06/05/2015  . Subclinical hypothyroidism 03/26/2015  . Herpes simplex type 2 infection 03/21/2015  . Rhinitis, allergic 03/21/2015  . ADHD (attention deficit hyperactivity disorder), inattentive type 09/20/2014  . Episodic paroxysmal anxiety disorder 09/20/2014  . History of gastric ulcer 09/20/2014  . Substance abuse ([place]) 09/20/2014  . HLD (hyperlipidemia) 10/03/2009  . Coronary artery disease involving native coronary artery of native heart with angina pectoris ([place]) 10/02/2009  . Hypertension goal BP (blood pressure) < 140/90 10/02/2009   PCP:  Steele Sizer, MD Pharmacy:   [facility], [place] [address] [facility] [address] [place] [address] Phone: [phone] Fax: [phone]     Social Determinants of Health (SDOH) Interventions    Readmission Risk Interventions No flowsheet data found.

## 2020-06-26 NOTE — Discharge Summary (Signed)
Physician Discharge Summary  TRINE FREAD NTZ:[id number] DOB: 09/25/1955 DOA: 06/23/2020  PCP: Steele Sizer, MD  Admit date: 06/23/2020 Discharge date: 06/26/2020  Admitted From: home Discharge disposition: home   Recommendations for Outpatient Follow-Up:   ? If symptoms from praluent injection: pt takes alirocumab (Praluent) q14 days with last dose 3/11 (PCSK9 inhibitor for cholesterol). This does have ADR of diarrhea and flu-like symptoms. Subpleural lung nodule: will need follow up CT scan in 6 months  Discharge Diagnosis:   SIRS Active Problems:   ADHD (attention deficit hyperactivity disorder), inattentive type   Hypertension goal BP (blood pressure) < 140/90   HLD (hyperlipidemia)   Episodic paroxysmal anxiety disorder   History of gastric ulcer   Herpes simplex type 2 infection   Polyarthralgia   Class 2 severe obesity due to excess calories with serious comorbidity and body mass index (BMI) of 36.0 to 36.9 in adult [facility])   History of tobacco use   ANA positive   Anxiety and depression   Intractable vomiting with nausea    Discharge Condition: Improved.  Diet recommendation: Low sodium, heart healthy.  Carbohydrate-modified.    Wound care: None.  Code status: Full.   History of Present Illness:   Linda Mejia is a 65 y.o. female with medical history significant for anxiety, arthritis, CAD, history of duodenitis, erosive gastritis, hypertension, irregular Z-line of the esophagus, history of Helicobacter pylori infection, obesity, hypertension, rheumatoid arthritis, GERD, presented to the emergency department for chief concerns of fever, nausea, vomiting, diarrhea.  Ms. Sprankle reports that she developed fever, nausea, and vomiting.  She states that she vomited once and she denies red blood and coffee ground, at 5 AM. She endorsed diffused joint pain, and chills. She took two tylenols which improved her symptoms. She endorses the last time she felt this way, she  was diagnosed and treated for septic shock, approximately 2 years ago.  She endorses diarrhea, watery, 2-3 bowel movements per day, and has been taking diarrhea suppression medication.  She states that she has been having diarrhea for 1 week.  She endorses dark stool and this has been going on since February 2022. She takes daily PPI, protonix. She endorses lost of appetite for one month.   She endorses sick contacts specifically, she reports her daughter and her granddaughter tested positive for covid, 06/17/20 and patient was exposed 06/17/20.  She denies changes to her diet.  She denies recent antibiotic use and/or steroid use.  She denies history of C. difficile infection.  She reports that the last injection she had for chronic joint pain was 3 months ago.  She cannot localize any new joint swelling or new joint pain.  She denies any new rashes.  Social history: lives by herself. Former tobacco user, 1 ppd, quit in 2006. Started smoking at age 29. She occasionally drinks etoh, two glasses of wine, three times per month. 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     Hospital Course by Problem:   SIR with no source found-do not have SEPSIS -WBC 15.3, lactic acid 2.2, temperature of 102.2 at home status posttwoTylenols at home, acute hypoxia -stool studies negative -UA unremarkable -Blood cultures x2 -Procalcitonin was negative -Lactic acid and TSH normal -CT abdomen and pelvis with contrast: with out source  -d/c abx for now -NP swab and COVID swab negative -upon discussion with pharmacy: pt takes alirocumab (Praluent) q14 days with last dose  3/11 (PCSK9 inhibitor for cholesterol). This does have ADR of diarrhea and flu-like symptoms. -heme negative  GERD-history of H. pylori infection -Hemoglobin hematocrit is stable and at her baseline -heme negative  HSV2 infection-resumed acyclovir 400 mg twice daily  Anxiety-resumed home  diazepam 10 mg every 8 hours as needed for anxiety  Depression -denied being on Celexa 20 mg daily so this was d/c'd  Denies alcohol use-- no sign of withdrawal  ADHD-Adderall 30 mg tablet p.o. twice daily resumed  Hypertension -on metoprolol tartrate 50 mg resumed upon d/c  History of polymyalgia rheumatica-outpatient follow-up -? If patient should be changed from praluent  obesity Body mass index is 35.45 kg/m.       Medical Consultants:      Discharge Exam:   Vitals:   06/26/20 0534 06/26/20 0833  BP: 120/77 (!) 157/86  Pulse: 79 81  Resp:  18  Temp:  97.8 F (36.6 C)  SpO2:  97%   Vitals:   06/26/20 0028 06/26/20 0427 06/26/20 0534 06/26/20 0833  BP: 126/69 (!) 125/113 120/77 (!) 157/86  Pulse: 85 79 79 81  Resp: 18 16  18   Temp: 97.6 F (36.4 C) 97.9 F (36.6 C)  97.8 F (36.6 C)  TempSrc: Oral Oral    SpO2: 97% 91%  97%  Weight:      Height:        General exam: Appears calm and comfortable.   The results of significant diagnostics from this hospitalization (including imaging, microbiology, ancillary and laboratory) are listed below for reference.     Procedures and Diagnostic Studies:   CT ABDOMEN PELVIS W CONTRAST  Result Date: 06/23/2020 CLINICAL DATA:  Abdominal pain, fever Infectious gastroenteritis or colitis Abdominal pain, acute, nonlocalized Fever, 102.2, diarrhea x 1 week, wbc 15.3, lactic acid 2.2 EXAM: CT ABDOMEN AND PELVIS WITH CONTRAST TECHNIQUE: Multidetector CT imaging of the abdomen and pelvis was performed using the standard protocol following bolus administration of intravenous contrast. CONTRAST:  171mL OMNIPAQUE IOHEXOL 300 MG/ML  SOLN COMPARISON:  CT abdomen pelvis 11/03/2019, CT angio chest 11/03/2019. FINDINGS: Lower chest: Interval development of an 8 mm subpleural paravertebral nodular-like density within the left lower lobe with associated subjacent atelectasis/scarring. Coronary artery calcifications.  Hepatobiliary: The hepatic parenchyma is diffusely hypodense compared to the splenic parenchyma consistent with fatty infiltration. No focal liver abnormality. No gallstones, gallbladder wall thickening, or pericholecystic fluid. No biliary dilatation. Pancreas: No focal lesion. Normal pancreatic contour. No surrounding inflammatory changes. No main pancreatic ductal dilatation. Spleen: Normal in size without focal abnormality. Adrenals/Urinary Tract: No adrenal nodule bilaterally. Bilateral kidneys enhance symmetrically. Subcentimeter hypodensities are too small to characterize. No hydronephrosis. No hydroureter. The urinary bladder is unremarkable. On delayed imaging, there is no urothelial wall thickening and there are no filling defects in the opacified portions of the bilateral collecting systems or ureters. Stomach/Bowel: Stomach is within normal limits. No evidence of bowel wall thickening or dilatation. Possible second portion of the duodenum diverticula. The lumen of the cecum is fluid-filled. No pericolonic fat stranding. No pneumatosis. Status post appendectomy. Vascular/Lymphatic: Infrarenal focal dilatation of the aorta measuring up to 2.7 cm. Otherwise no abdominal aorta or iliac aneurysm. Moderate to severe calcified and noncalcified atherosclerotic plaque of the aorta and its branches. No abdominal, pelvic, or inguinal lymphadenopathy. Reproductive: Uterus and bilateral adnexa are unremarkable. Other: No intraperitoneal free fluid. No intraperitoneal free gas. No organized fluid collection. Musculoskeletal: No abdominal wall hernia or abnormality. No suspicious lytic or blastic osseous lesions. No  acute displaced fracture. Multilevel mild degenerative changes of the spine. Grade 1 anterolisthesis of L5-4 on L5. IMPRESSION: 1. Fluid-filled cecal lumen suggestive of a fast transition state. 2. 8 mm subpleural paravertebral nodular-like density within the left lower lobe with associated subjacent  atelectasis/scarring. Non-contrast chest CT at 6-12 months is recommended. If the nodule is stable at time of repeat CT, then future CT at 18-24 months (from today's scan) is considered optional for low-risk patients, but is recommended for high-risk patients. This recommendation follows the consensus statement: Guidelines for Management of Incidental Pulmonary Nodules Detected on CT Images: From the Fleischner Society 2017; Radiology 2017; 284:228-243. 3.  Aortic Atherosclerosis (ICD10-I70.0). Electronically Signed   By: Iven Finn M.D.   On: 06/23/2020 22:48   DG Chest Portable 1 View  Result Date: 06/23/2020 CLINICAL DATA:  Shortness of breath EXAM: PORTABLE CHEST 1 VIEW COMPARISON:  12/12/2019 FINDINGS: The heart size and mediastinal contours are within normal limits. Both lungs are clear. The visualized skeletal structures are unremarkable. IMPRESSION: No acute abnormality of the lungs in AP portable projection. Electronically Signed   By: Eddie Candle M.D.   On: 06/23/2020 17:46     Labs:   Basic Metabolic Panel: Recent Labs  Lab 06/23/20 1559 06/24/20 0023 06/25/20 0424 06/26/20 0542  NA 135 137 140 141  K 4.2 3.5 4.0 3.6  CL 99 105 109 106  CO2 25 25 26 28   GLUCOSE 128* 109* 95 92  BUN 14 11 12  7*  CREATININE 0.73 0.59 0.65 0.59  CALCIUM 8.2* 7.6* 7.6* 8.2*   GFR Estimated Creatinine Clearance: 82.7 mL/min (by C-G formula based on SCr of 0.59 mg/dL). Liver Function Tests: Recent Labs  Lab 06/23/20 1559 06/24/20 0023  AST 27 18  ALT 14 13  ALKPHOS 82 75  BILITOT 0.7 0.6  PROT 7.4 6.3*  ALBUMIN 4.0 3.4*   Recent Labs  Lab 06/23/20 2233  LIPASE 26   No results for input(s): AMMONIA in the last 168 hours. Coagulation profile Recent Labs  Lab 06/24/20 0023  INR 1.1    CBC: Recent Labs  Lab 06/23/20 1559 06/24/20 0023 06/25/20 0424 06/26/20 0542  WBC 15.3* 13.9* 5.5 3.3*  NEUTROABS 12.4*  --   --   --   HGB 13.2 11.3* 10.3* 11.8*  HCT 40.6 35.6*  32.2* 35.8*  MCV 96.4 98.1 99.7 96.0  PLT 322 269 223 214   Cardiac Enzymes: No results for input(s): CKTOTAL, CKMB, CKMBINDEX, TROPONINI in the last 168 hours. BNP: Invalid input(s): POCBNP CBG: No results for input(s): GLUCAP in the last 168 hours. D-Dimer Recent Labs    06/24/20 0023  DDIMER 0.62*   Hgb A1c No results for input(s): HGBA1C in the last 72 hours. Lipid Profile No results for input(s): CHOL, HDL, LDLCALC, TRIG, CHOLHDL, LDLDIRECT in the last 72 hours. Thyroid function studies Recent Labs    06/23/20 2233  TSH 0.892   Anemia work up Recent Labs    06/26/20 0542  VITAMINB12 490  FERRITIN 155  TIBC 248*  IRON 45   Microbiology Recent Results (from the past 240 hour(s))  Resp Panel by RT-PCR (Flu A&B, Covid) Nasopharyngeal Swab     Status: None   Collection Time: 06/23/20  4:57 PM   Specimen: Nasopharyngeal Swab; Nasopharyngeal(NP) swabs in vial transport medium  Result Value Ref Range Status   SARS Coronavirus 2 by RT PCR NEGATIVE NEGATIVE Final    Comment: (NOTE) SARS-CoV-2 target nucleic acids are NOT DETECTED.  The SARS-CoV-2 RNA is generally detectable in upper respiratory specimens during the acute phase of infection. The lowest concentration of SARS-CoV-2 viral copies this assay can detect is 138 copies/mL. A negative result does not preclude SARS-Cov-2 infection and should not be used as the sole basis for treatment or other patient management decisions. A negative result may occur with  improper specimen collection/handling, submission of specimen other than nasopharyngeal swab, presence of viral mutation(s) within the areas targeted by this assay, and inadequate number of viral copies(<138 copies/mL). A negative result must be combined with clinical observations, patient history, and epidemiological information. The expected result is Negative.  Fact Sheet for Patients:  EntrepreneurPulse.com.au  Fact Sheet for  Healthcare Providers:  IncredibleEmployment.be  This test is no t yet approved or cleared by the [place] FDA and  has been authorized for detection and/or diagnosis of SARS-CoV-2 by FDA under an Emergency Use Authorization (EUA). This EUA will remain  in effect (meaning this test can be used) for the duration of the COVID-19 declaration under Section 564(b)(1) of the Act, 21 U.S.C.section 360bbb-3(b)(1), unless the authorization is terminated  or revoked sooner.       Influenza A by PCR NEGATIVE NEGATIVE Final   Influenza B by PCR NEGATIVE NEGATIVE Final    Comment: (NOTE) The Xpert Xpress SARS-CoV-2/FLU/RSV plus assay is intended as an aid in the diagnosis of influenza from Nasopharyngeal swab specimens and should not be used as a sole basis for treatment. Nasal washings and aspirates are unacceptable for Xpert Xpress SARS-CoV-2/FLU/RSV testing.  Fact Sheet for Patients: EntrepreneurPulse.com.au  Fact Sheet for Healthcare Providers: IncredibleEmployment.be  This test is not yet approved or cleared by the [place] FDA and has been authorized for detection and/or diagnosis of SARS-CoV-2 by FDA under an Emergency Use Authorization (EUA). This EUA will remain in effect (meaning this test can be used) for the duration of the COVID-19 declaration under Section 564(b)(1) of the Act, 21 U.S.C. section 360bbb-3(b)(1), unless the authorization is terminated or revoked.  Performed at [facility], [address] [place], [place] [address]   Blood culture (routine x 2)     Status: None (Preliminary result)   Collection Time: 06/23/20  5:23 PM   Specimen: BLOOD  Result Value Ref Range Status   Specimen Description BLOOD RIGHT ARM  Final   Special Requests   Final    BOTTLES DRAWN AEROBIC AND ANAEROBIC Blood Culture adequate volume   Culture   Final    NO GROWTH 3 DAYS Performed at [facility],  [address], [place], [place] [address]    Report Status PENDING  Incomplete  Blood culture (routine x 2)     Status: None (Preliminary result)   Collection Time: 06/23/20  5:23 PM   Specimen: BLOOD  Result Value Ref Range Status   Specimen Description BLOOD RIGHT ARM  Final   Special Requests   Final    BOTTLES DRAWN AEROBIC AND ANAEROBIC Blood Culture results may not be optimal due to an excessive volume of blood received in culture bottles   Culture   Final    NO GROWTH 3 DAYS Performed at [facility], [address], [place], [place] [address]    Report Status PENDING  Incomplete  C Difficile Quick Screen w PCR reflex     Status: None   Collection Time: 06/23/20 10:33 PM   Specimen: STOOL  Result Value Ref Range Status   C Diff antigen NEGATIVE NEGATIVE Final  C Diff toxin NEGATIVE NEGATIVE Final   C Diff interpretation No C. difficile detected.  Final    Comment: Performed at [facility], [address] [place], [place] [address]  Gastrointestinal Panel by PCR , Stool     Status: None   Collection Time: 06/23/20 10:33 PM   Specimen: Stool  Result Value Ref Range Status   Campylobacter species NOT DETECTED NOT DETECTED Final   Plesimonas shigelloides NOT DETECTED NOT DETECTED Final   Salmonella species NOT DETECTED NOT DETECTED Final   Yersinia enterocolitica NOT DETECTED NOT DETECTED Final   Vibrio species NOT DETECTED NOT DETECTED Final   Vibrio cholerae NOT DETECTED NOT DETECTED Final   Enteroaggregative E coli (EAEC) NOT DETECTED NOT DETECTED Final   Enteropathogenic E coli (EPEC) NOT DETECTED NOT DETECTED Final   Enterotoxigenic E coli (ETEC) NOT DETECTED NOT DETECTED Final   Shiga like toxin producing E coli (STEC) NOT DETECTED NOT DETECTED Final   Shigella/Enteroinvasive E coli (EIEC) NOT DETECTED NOT DETECTED Final   Cryptosporidium NOT DETECTED NOT DETECTED Final   Cyclospora cayetanensis NOT DETECTED NOT DETECTED Final   Entamoeba  histolytica NOT DETECTED NOT DETECTED Final   Giardia lamblia NOT DETECTED NOT DETECTED Final   Adenovirus F40/41 NOT DETECTED NOT DETECTED Final   Astrovirus NOT DETECTED NOT DETECTED Final   Norovirus GI/GII NOT DETECTED NOT DETECTED Final   Rotavirus A NOT DETECTED NOT DETECTED Final   Sapovirus (I, II, IV, and V) NOT DETECTED NOT DETECTED Final    Comment: Performed at [facility], [address] [place], [place] [address]  Respiratory (~20 pathogens) panel by PCR     Status: None   Collection Time: 06/25/20 11:18 AM   Specimen: Nasopharyngeal Swab; Respiratory  Result Value Ref Range Status   Adenovirus NOT DETECTED NOT DETECTED Final   Coronavirus 229E NOT DETECTED NOT DETECTED Final    Comment: (NOTE) The Coronavirus on the Respiratory Panel, DOES NOT test for the novel  Coronavirus (2019 nCoV)    Coronavirus HKU1 NOT DETECTED NOT DETECTED Final   Coronavirus NL63 NOT DETECTED NOT DETECTED Final   Coronavirus OC43 NOT DETECTED NOT DETECTED Final   Metapneumovirus NOT DETECTED NOT DETECTED Final   Rhinovirus / Enterovirus NOT DETECTED NOT DETECTED Final   Influenza A NOT DETECTED NOT DETECTED Final   Influenza B NOT DETECTED NOT DETECTED Final   Parainfluenza Virus 1 NOT DETECTED NOT DETECTED Final   Parainfluenza Virus 2 NOT DETECTED NOT DETECTED Final   Parainfluenza Virus 3 NOT DETECTED NOT DETECTED Final   Parainfluenza Virus 4 NOT DETECTED NOT DETECTED Final   Respiratory Syncytial Virus NOT DETECTED NOT DETECTED Final   Bordetella pertussis NOT DETECTED NOT DETECTED Final   Bordetella Parapertussis NOT DETECTED NOT DETECTED Final   Chlamydophila pneumoniae NOT DETECTED NOT DETECTED Final   Mycoplasma pneumoniae NOT DETECTED NOT DETECTED Final    Comment: Performed at [facility], [place], [place], [place] [address]  SARS CORONAVIRUS 2 (TAT 6-24 HRS) Nasopharyngeal Nasopharyngeal Swab     Status: None   Collection Time: 06/25/20 11:18 AM    Specimen: Nasopharyngeal Swab  Result Value Ref Range Status   SARS Coronavirus 2 NEGATIVE NEGATIVE Final    Comment: (NOTE) SARS-CoV-2 target nucleic acids are NOT DETECTED.  The SARS-CoV-2 RNA is generally detectable in upper and lower respiratory specimens during the acute phase of infection. Negative results do not preclude SARS-CoV-2 infection, do not rule out co-infections with other  pathogens, and should not be used as the sole basis for treatment or other patient management decisions. Negative results must be combined with clinical observations, patient history, and epidemiological information. The expected result is Negative.  Fact Sheet for Patients: SugarRoll.be  Fact Sheet for Healthcare Providers: https://www.woods-mathews.com/  This test is not yet approved or cleared by the [place] FDA and  has been authorized for detection and/or diagnosis of SARS-CoV-2 by FDA under an Emergency Use Authorization (EUA). This EUA will remain  in effect (meaning this test can be used) for the duration of the COVID-19 declaration under Se ction 564(b)(1) of the Act, 21 U.S.C. section 360bbb-3(b)(1), unless the authorization is terminated or revoked sooner.  Performed at [facility], [place] [address] [place], [place] [address]      Discharge Instructions:   Discharge Instructions    Diet - low sodium heart healthy   Complete by: As directed    Diet Carb Modified   Complete by: As directed    Discharge instructions   Complete by: As directed    Discuss with physicians any alternative to praluent Can take OTC pro-biotic   Increase activity slowly   Complete by: As directed      Allergies as of 06/26/2020      Reactions   Amlodipine    Leg swelling   Statins Other (See Comments)      Medication List    STOP taking these medications   Praluent 75 MG/ML Soaj Generic drug: Alirocumab     TAKE these medications    acyclovir 400 MG tablet Commonly known as: ZOVIRAX Take 1 tablet (400 mg total) by mouth 2 (two) times daily.   amphetamine-dextroamphetamine 30 MG tablet Commonly known as: ADDERALL Take 30 mg by mouth 2 (two) times daily.   aspirin EC 81 MG tablet Take 81 mg by mouth daily.   cholecalciferol 25 MCG (1000 UNIT) tablet Commonly known as: VITAMIN D3 Take 1,000 Units by mouth daily.   cyanocobalamin 1000 MCG tablet Take 1,000 mcg by mouth daily.   diazepam 10 MG tablet Commonly known as: VALIUM Take 10 mg by mouth 3 (three) times daily as needed.   fluticasone 50 MCG/ACT nasal spray Commonly known as: FLONASE Place 2 sprays into both nostrils daily as needed for allergies.   metoprolol tartrate 50 MG tablet Commonly known as: LOPRESSOR Take 1 tablet (50 mg total) by mouth 2 (two) times daily.   nystatin powder Commonly known as: MYCOSTATIN/NYSTOP Apply topically 2 (two) times daily.   pantoprazole 40 MG tablet Commonly known as: PROTONIX Take 1 tablet (40 mg total) by mouth daily.       Follow-up Information    Steele Sizer, MD In 1 week.   Specialty: Family Medicine Why: Patient to make own follow up appt Office not answering phone at this time Contact information: [address] [place] [address] [phone]                Time coordinating discharge: 35 min  Signed:  Geradine Girt DO  [facility] 06/26/2020, 12:53 PM

## 2020-06-26 NOTE — Progress Notes (Signed)
Patient denies alcohol use- this is supported by no sign of withdrawal and no score on CIWA. JV

## 2020-06-28 LAB — CULTURE, BLOOD (ROUTINE X 2)
Culture: NO GROWTH
Culture: NO GROWTH
Special Requests: ADEQUATE

## 2020-07-18 ENCOUNTER — Other Ambulatory Visit: Payer: Self-pay

## 2020-07-18 ENCOUNTER — Ambulatory Visit (INDEPENDENT_AMBULATORY_CARE_PROVIDER_SITE_OTHER): Payer: Medicare Other | Admitting: Gastroenterology

## 2020-07-18 ENCOUNTER — Encounter: Payer: Self-pay | Admitting: Gastroenterology

## 2020-07-18 VITALS — BP 159/97 | HR 80 | Temp 98.1°F | Ht 65.0 in | Wt 213.2 lb

## 2020-07-18 DIAGNOSIS — R197 Diarrhea, unspecified: Secondary | ICD-10-CM

## 2020-07-18 DIAGNOSIS — R159 Full incontinence of feces: Secondary | ICD-10-CM

## 2020-07-18 DIAGNOSIS — Z1211 Encounter for screening for malignant neoplasm of colon: Secondary | ICD-10-CM

## 2020-07-18 MED ORDER — NA SULFATE-K SULFATE-MG SULF 17.5-3.13-1.6 GM/177ML PO SOLN
ORAL | 0 refills | Status: DC
Start: 2020-07-18 — End: 2020-08-22

## 2020-07-18 NOTE — Progress Notes (Signed)
Linda Antigua, MD [address]  [place]  [place], [place] [address]  Main: [phone]  Fax: [phone]   Primary Care Physician: Steele Sizer, MD   Chief complaint: Diarrhea, fecal incontinence  HPI: AANIKA Mejia is a 65 y.o. female who reports 84-month history of diarrhea and fecal incontinence.  Reports at least 3-4 loose bowel moments today, with no blood.  Patient was recently discharged from the hospital when she was admitted with SIRS of unknown etiology.  She had GI profile and C. difficile testing done which was negative for any infectious etiology.  Patient denies any urinary incontinence.  Has not had any recent colonoscopy.  Screening colonoscopy was planned previously but patient was unable to get it done.  Patient takes Imodium only as needed and has only been taking once during the day when she does take it.  Current Outpatient Medications  Medication Sig Dispense Refill  . acyclovir (ZOVIRAX) 400 MG tablet Take 1 tablet (400 mg total) by mouth 2 (two) times daily. 60 tablet 5  . amphetamine-dextroamphetamine (ADDERALL) 30 MG tablet Take 30 mg by mouth 2 (two) times daily.    Marland Kitchen aspirin EC 81 MG tablet Take 81 mg by mouth daily.    . diazepam (VALIUM) 10 MG tablet Take 10 mg by mouth 3 (three) times daily as needed.    . fluticasone (FLONASE) 50 MCG/ACT nasal spray Place 2 sprays into both nostrils daily as needed for allergies. 16 g 11  . metoprolol tartrate (LOPRESSOR) 50 MG tablet Take 1 tablet (50 mg total) by mouth 2 (two) times daily. 60 tablet 5  . Na Sulfate-K Sulfate-Mg Sulf 17.5-3.13-1.6 GM/177ML SOLN At 5 PM the day before procedure take 1 bottle and 5 hours before procedure take 1 bottle. 354 mL 0  . nystatin (MYCOSTATIN/NYSTOP) powder Apply topically 2 (two) times daily. 60 g 0  . pantoprazole (PROTONIX) 40 MG tablet Take 1 tablet (40 mg total) by mouth daily. 30 tablet 0  . cholecalciferol (VITAMIN D3) 25 MCG (1000 UNIT) tablet Take 1,000 Units  by mouth daily. (Patient not taking: Reported on 07/18/2020)    . cyanocobalamin 1000 MCG tablet Take 1,000 mcg by mouth daily. (Patient not taking: Reported on 07/18/2020)     No current facility-administered medications for this visit.    Allergies as of 07/18/2020 - Review Complete 07/18/2020  Allergen Reaction Noted  . Amlodipine  10/24/2019  . Statins Other (See Comments) 11/04/2019    ROS:  General: Negative for anorexia, weight loss, fever, chills, fatigue, weakness. ENT: Negative for hoarseness, difficulty swallowing , nasal congestion. CV: Negative for chest pain, angina, palpitations, dyspnea on exertion, peripheral edema.  Respiratory: Negative for dyspnea at rest, dyspnea on exertion, cough, sputum, wheezing.  GI: See history of present illness. GU:  Negative for dysuria, hematuria, urinary incontinence, urinary frequency, nocturnal urination.  Endo: Negative for unusual weight change.    Physical Examination:   BP (!) 159/97   Pulse 80   Temp 98.1 F (36.7 C) (Oral)   Ht 5\' 5"  (1.651 m)   Wt 213 lb 3.2 oz (96.7 kg)   BMI 35.48 kg/m   General: Well-nourished, well-developed in no acute distress.  Eyes: No icterus. Conjunctivae pink. Mouth: Oropharyngeal mucosa moist and pink , no lesions erythema or exudate. Neck: Supple, Trachea midline Abdomen: Bowel sounds are normal, nontender, nondistended, no hepatosplenomegaly or masses, no abdominal bruits or hernia , no rebound or guarding.   Extremities: No lower extremity edema.  No clubbing or deformities. Neuro: Alert and oriented x 3.  Grossly intact. Skin: Warm and dry, no jaundice.   Psych: Alert and cooperative, normal mood and affect.   Labs: CMP     Component Value Date/Time   NA 141 06/26/2020 0542   NA 144 06/05/2015 1203   NA 142 08/03/2014 2009   K 3.6 06/26/2020 0542   K 3.6 08/03/2014 2009   CL 106 06/26/2020 0542   CL 108 08/03/2014 2009   CO2 28 06/26/2020 0542   CO2 27 08/03/2014 2009    GLUCOSE 92 06/26/2020 0542   GLUCOSE 102 (H) 08/03/2014 2009   BUN 7 (L) 06/26/2020 0542   BUN 13 06/05/2015 1203   BUN 15 08/03/2014 2009   CREATININE 0.59 06/26/2020 0542   CREATININE 0.78 12/06/2018 1406   CALCIUM 8.2 (L) 06/26/2020 0542   CALCIUM 8.3 (L) 08/03/2014 2009   PROT 6.3 (L) 06/24/2020 0023   PROT 6.9 08/03/2014 2009   ALBUMIN 3.4 (L) 06/24/2020 0023   ALBUMIN 3.9 08/03/2014 2009   AST 18 06/24/2020 0023   AST 24 08/03/2014 2009   ALT 13 06/24/2020 0023   ALT 12 (L) 08/03/2014 2009   ALKPHOS 75 06/24/2020 0023   ALKPHOS 63 08/03/2014 2009   BILITOT 0.6 06/24/2020 0023   BILITOT <0.1 (L) 08/03/2014 2009   GFRNONAA >60 06/26/2020 0542   GFRNONAA 81 12/06/2018 1406   GFRAA >60 12/12/2019 1619   GFRAA 94 12/06/2018 1406   Lab Results  Component Value Date   WBC 3.3 (L) 06/26/2020   HGB 11.8 (L) 06/26/2020   HCT 35.8 (L) 06/26/2020   MCV 96.0 06/26/2020   PLT 214 06/26/2020    Imaging Studies: CT ABDOMEN PELVIS W CONTRAST  Result Date: 06/23/2020 CLINICAL DATA:  Abdominal pain, fever Infectious gastroenteritis or colitis Abdominal pain, acute, nonlocalized Fever, 102.2, diarrhea x 1 week, wbc 15.3, lactic acid 2.2 EXAM: CT ABDOMEN AND PELVIS WITH CONTRAST TECHNIQUE: Multidetector CT imaging of the abdomen and pelvis was performed using the standard protocol following bolus administration of intravenous contrast. CONTRAST:  141mL OMNIPAQUE IOHEXOL 300 MG/ML  SOLN COMPARISON:  CT abdomen pelvis 11/03/2019, CT angio chest 11/03/2019. FINDINGS: Lower chest: Interval development of an 8 mm subpleural paravertebral nodular-like density within the left lower lobe with associated subjacent atelectasis/scarring. Coronary artery calcifications. Hepatobiliary: The hepatic parenchyma is diffusely hypodense compared to the splenic parenchyma consistent with fatty infiltration. No focal liver abnormality. No gallstones, gallbladder wall thickening, or pericholecystic fluid. No  biliary dilatation. Pancreas: No focal lesion. Normal pancreatic contour. No surrounding inflammatory changes. No main pancreatic ductal dilatation. Spleen: Normal in size without focal abnormality. Adrenals/Urinary Tract: No adrenal nodule bilaterally. Bilateral kidneys enhance symmetrically. Subcentimeter hypodensities are too small to characterize. No hydronephrosis. No hydroureter. The urinary bladder is unremarkable. On delayed imaging, there is no urothelial wall thickening and there are no filling defects in the opacified portions of the bilateral collecting systems or ureters. Stomach/Bowel: Stomach is within normal limits. No evidence of bowel wall thickening or dilatation. Possible second portion of the duodenum diverticula. The lumen of the cecum is fluid-filled. No pericolonic fat stranding. No pneumatosis. Status post appendectomy. Vascular/Lymphatic: Infrarenal focal dilatation of the aorta measuring up to 2.7 cm. Otherwise no abdominal aorta or iliac aneurysm. Moderate to severe calcified and noncalcified atherosclerotic plaque of the aorta and its branches. No abdominal, pelvic, or inguinal lymphadenopathy. Reproductive: Uterus and bilateral adnexa are unremarkable. Other: No intraperitoneal free fluid. No intraperitoneal free gas. No  organized fluid collection. Musculoskeletal: No abdominal wall hernia or abnormality. No suspicious lytic or blastic osseous lesions. No acute displaced fracture. Multilevel mild degenerative changes of the spine. Grade 1 anterolisthesis of L5-4 on L5. IMPRESSION: 1. Fluid-filled cecal lumen suggestive of a fast transition state. 2. 8 mm subpleural paravertebral nodular-like density within the left lower lobe with associated subjacent atelectasis/scarring. Non-contrast chest CT at 6-12 months is recommended. If the nodule is stable at time of repeat CT, then future CT at 18-24 months (from today's scan) is considered optional for low-risk patients, but is recommended  for high-risk patients. This recommendation follows the consensus statement: Guidelines for Management of Incidental Pulmonary Nodules Detected on CT Images: From the Fleischner Society 2017; Radiology 2017; 284:228-243. 3.  Aortic Atherosclerosis (ICD10-I70.0). Electronically Signed   By: Iven Finn M.D.   On: 06/23/2020 22:48   DG Chest Portable 1 View  Result Date: 06/23/2020 CLINICAL DATA:  Shortness of breath EXAM: PORTABLE CHEST 1 VIEW COMPARISON:  12/12/2019 FINDINGS: The heart size and mediastinal contours are within normal limits. Both lungs are clear. The visualized skeletal structures are unremarkable. IMPRESSION: No acute abnormality of the lungs in AP portable projection. Electronically Signed   By: Eddie Candle M.D.   On: 06/23/2020 17:46    Assessment and Plan:   ELVERA ALMARIO is a 65 y.o. y/o female 61-month history of diarrhea, with negative infectious panel  Patient is frustrated with her symptoms as it is affecting her quality of life Would recommend colonoscopy to rule out microscopic colitis and for screening as well  I have discussed alternative options, risks & benefits,  which include, but are not limited to, bleeding, infection, perforation,respiratory complication & drug reaction.  The patient agrees with this plan & written consent will be obtained.    I have advised her to start taking Imodium twice a day scheduled and hold for hard stools or constipation  In addition to that, she can take extra doses as needed, as long as they are at least 2 hours apart and no more than 6 pills a day and she verbalized understanding of the instructions  If no microscopic colitis present, consider cholestyramine, fecal elastase,, diet changes and other measures  Dr Linda Mejia

## 2020-07-20 ENCOUNTER — Other Ambulatory Visit: Payer: Self-pay

## 2020-07-30 ENCOUNTER — Telehealth: Payer: Self-pay

## 2020-07-30 ENCOUNTER — Encounter: Payer: Self-pay | Admitting: Family Medicine

## 2020-07-30 NOTE — Telephone Encounter (Signed)
Copied from [employer] [id number]. Topic: General - Other >> Jul 27, 2020 12:24 PM Celene Kras wrote: Reason for CRM: Pt called stating that she went through her mychart where it states that she has been diagnosed with Cancer. She states that she has never received this diagnosis and is requesting to have someone give her a call back to discuss. Please advise.

## 2020-08-17 ENCOUNTER — Ambulatory Visit
Admission: RE | Admit: 2020-08-17 | Discharge: 2020-08-17 | Disposition: A | Discharge status: Home / Self Care | Payer: Medicare Other | Attending: Gastroenterology | Admitting: Gastroenterology

## 2020-08-17 ENCOUNTER — Encounter
Admission: RE | Disposition: A | Discharge status: Home / Self Care | Payer: Self-pay | Source: Home / Self Care | Attending: Gastroenterology

## 2020-08-17 ENCOUNTER — Ambulatory Visit: Payer: Medicare Other | Admitting: Anesthesiology

## 2020-08-17 DIAGNOSIS — Z888 Allergy status to other drugs, medicaments and biological substances status: Secondary | ICD-10-CM | POA: Diagnosis not present

## 2020-08-17 DIAGNOSIS — K635 Polyp of colon: Secondary | ICD-10-CM

## 2020-08-17 DIAGNOSIS — D124 Benign neoplasm of descending colon: Secondary | ICD-10-CM | POA: Insufficient documentation

## 2020-08-17 DIAGNOSIS — D122 Benign neoplasm of ascending colon: Secondary | ICD-10-CM | POA: Diagnosis not present

## 2020-08-17 DIAGNOSIS — K649 Unspecified hemorrhoids: Secondary | ICD-10-CM | POA: Diagnosis not present

## 2020-08-17 DIAGNOSIS — K5790 Diverticulosis of intestine, part unspecified, without perforation or abscess without bleeding: Secondary | ICD-10-CM | POA: Diagnosis not present

## 2020-08-17 DIAGNOSIS — Z955 Presence of coronary angioplasty implant and graft: Secondary | ICD-10-CM | POA: Insufficient documentation

## 2020-08-17 DIAGNOSIS — Z79899 Other long term (current) drug therapy: Secondary | ICD-10-CM | POA: Diagnosis not present

## 2020-08-17 DIAGNOSIS — Z7982 Long term (current) use of aspirin: Secondary | ICD-10-CM | POA: Insufficient documentation

## 2020-08-17 DIAGNOSIS — K5730 Diverticulosis of large intestine without perforation or abscess without bleeding: Secondary | ICD-10-CM | POA: Diagnosis not present

## 2020-08-17 DIAGNOSIS — K52839 Microscopic colitis, unspecified: Secondary | ICD-10-CM

## 2020-08-17 DIAGNOSIS — Z87891 Personal history of nicotine dependence: Secondary | ICD-10-CM | POA: Insufficient documentation

## 2020-08-17 DIAGNOSIS — Z1211 Encounter for screening for malignant neoplasm of colon: Secondary | ICD-10-CM | POA: Insufficient documentation

## 2020-08-17 DIAGNOSIS — K648 Other hemorrhoids: Secondary | ICD-10-CM | POA: Insufficient documentation

## 2020-08-17 HISTORY — PX: COLONOSCOPY WITH PROPOFOL: SHX5780

## 2020-08-17 SURGERY — COLONOSCOPY WITH PROPOFOL
Anesthesia: General

## 2020-08-17 MED ORDER — PROPOFOL 500 MG/50ML IV EMUL
INTRAVENOUS | Status: AC
  Filled 2020-08-17: qty 50

## 2020-08-17 MED ORDER — SODIUM CHLORIDE 0.9 % IV SOLN
INTRAVENOUS | Status: DC
  Administered 2020-08-17: 20 mL/h via INTRAVENOUS

## 2020-08-17 MED ORDER — PROPOFOL 500 MG/50ML IV EMUL
INTRAVENOUS | Status: DC | PRN
  Administered 2020-08-17: 200 ug/kg/min via INTRAVENOUS

## 2020-08-17 MED ORDER — PROPOFOL 10 MG/ML IV BOLUS
INTRAVENOUS | Status: AC
  Filled 2020-08-17: qty 20

## 2020-08-17 MED ORDER — LIDOCAINE HCL (PF) 2 % IJ SOLN
INTRAMUSCULAR | Status: AC
  Filled 2020-08-17: qty 5

## 2020-08-17 MED ORDER — PROPOFOL 10 MG/ML IV BOLUS
INTRAVENOUS | Status: DC | PRN
  Administered 2020-08-17: 60 mg via INTRAVENOUS
  Administered 2020-08-17: 90 mg via INTRAVENOUS

## 2020-08-17 MED ORDER — LIDOCAINE 2% (20 MG/ML) 5 ML SYRINGE
INTRAMUSCULAR | Status: DC | PRN
  Administered 2020-08-17: 50 mg via INTRAVENOUS

## 2020-08-17 NOTE — Anesthesia Preprocedure Evaluation (Signed)
Anesthesia Evaluation  Patient identified by MRN, date of birth, ID band Patient awake    Reviewed: Allergy & Precautions, H&P , NPO status , Patient's Chart, lab work & pertinent test results, reviewed documented beta blocker date and time   Airway Mallampati: II   Neck ROM: full    Dental  (+) Poor Dentition   Pulmonary neg pulmonary ROS, former smoker,    Pulmonary exam normal        Cardiovascular Exercise Tolerance: Good hypertension, + angina with exertion + CAD  Normal cardiovascular exam Rhythm:regular Rate:Normal     Neuro/Psych PSYCHIATRIC DISORDERS Anxiety Depression negative neurological ROS     GI/Hepatic Neg liver ROS, GERD  Medicated,  Endo/Other  Hypothyroidism   Renal/GU Renal disease  negative genitourinary   Musculoskeletal   Abdominal   Peds  Hematology negative hematology ROS (+)   Anesthesia Other Findings Past Medical History: 12/02/2016: Abnormal ankle brachial index (ABI) No date: ADHD (attention deficit hyperactivity disorder) No date: AKI (acute kidney injury) ([place]) 2020: Allergy     Comment:  Can't take amlopine. Causes swelling No date: Anxiety 12/02/2016: Aortic atherosclerosis ([place])     Comment:  July 2018 No date: Arthritis No date: Chronic back pain No date: Coronary artery disease 2020: Depression     Comment:  I think everyone is depressed at this unprecedented               time! No date: Encounter for HIV (human immunodeficiency virus) test 05/08/2020: Essential (hemorrhagic) thrombocythemia ([place]) No date: GERD (gastroesophageal reflux disease) No date: Herpes genitalis in women No date: History of stomach ulcers     Comment:  x7 this year. 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  2000 09/21/2015: ESOPHAGOGASTRODUODENOSCOPY (EGD) WITH PROPOFOL; N/A     Comment:  Procedure: ESOPHAGOGASTRODUODENOSCOPY (EGD) WITH               PROPOFOL;  Surgeon: Lucilla Lame, MD;  Location: [facility]               ENDOSCOPY;  Service: Endoscopy;  Laterality: N/A; 07/30/2016: ESOPHAGOGASTRODUODENOSCOPY (EGD) WITH PROPOFOL; N/A     Comment:  Procedure: ESOPHAGOGASTRODUODENOSCOPY (EGD) WITH               PROPOFOL;  Surgeon: Jonathon Bellows, MD;  Location: [facility]               ENDOSCOPY;  Service: Endoscopy;  Laterality: N/A; 04/23/2018: ESOPHAGOGASTRODUODENOSCOPY (EGD) WITH PROPOFOL; N/A     Comment:  Procedure: ESOPHAGOGASTRODUODENOSCOPY (EGD) WITH               PROPOFOL;  Surgeon: Virgel Manifold, MD;  Location:               [facility] ENDOSCOPY;  Service: Endoscopy;  Laterality: N/A; 11/04/2019: ESOPHAGOGASTRODUODENOSCOPY (EGD) WITH PROPOFOL; N/A     Comment:  Procedure: ESOPHAGOGASTRODUODENOSCOPY (EGD) WITH               PROPOFOL;  Surgeon: Lin Landsman, MD;  Location:  [facility] ENDOSCOPY;  Service: Gastroenterology;  Laterality:               N/A; Dec 2021: EYE SURGERY     Comment:  Droopy eye lids 01/25/2018: LEFT HEART CATH; Right     Comment:  Procedure: Left Heart Cath and Coronary Angiography;                Surgeon: Dionisio David, MD;  Location: [facility]              [facility];  Service: Cardiovascular;  Laterality: Right; 08/12/2016: LEFT HEART CATH AND CORONARY ANGIOGRAPHY; Right     Comment:  Procedure: Left Heart Cath and Coronary Angiography;                Surgeon: Dionisio David, MD;  Location: [facility]               [facility];  Service:  Cardiovascular;  Laterality: Right; No date: SMALL INTESTINE SURGERY No date: TONSILLECTOMY No date: TUBAL LIGATION BMI    Body Mass Index: 33.95 kg/m     Reproductive/Obstetrics negative OB ROS                             Anesthesia Physical Anesthesia Plan  ASA: III  Anesthesia Plan: General   Post-op Pain Management:    Induction:   PONV Risk Score and Plan:   Airway Management Planned:   Additional Equipment:   Intra-op Plan:   Post-operative Plan:   Informed Consent: I have reviewed the patients History and Physical, chart, labs and discussed the procedure including the risks, benefits and alternatives for the proposed anesthesia with the patient or authorized representative who has indicated his/her understanding and acceptance.     Dental Advisory Given  Plan Discussed with: CRNA  Anesthesia Plan Comments:         Anesthesia Quick Evaluation

## 2020-08-17 NOTE — Op Note (Signed)
[facility] Gastroenterology Patient Name: Linda Mejia Procedure Date: 08/17/2020 9:28 AM MRN: [id number] Account #: [id number] Date of Birth: Jun 10, 1955 Admit Type: Outpatient Age: 65 Room: [facility] ENDO ROOM 2 Gender: Female Note Status: Finalized Procedure:             Colonoscopy Indications:           Screening for colorectal malignant neoplasm Providers:             Marveline Profeta B. Bonna Gains MD, MD Referring MD:          Bethena Roys. Sowles, MD (Referring MD) Medicines:             Monitored Anesthesia Care Complications:         No immediate complications. Procedure:             Pre-Anesthesia Assessment:                        - ASA Grade Assessment: II - A patient with mild                         systemic disease.                        - Prior to the procedure, a History and Physical was                         performed, and patient medications, allergies and                         sensitivities were reviewed. The patient's tolerance                         of previous anesthesia was reviewed.                        - The risks and benefits of the procedure and the                         sedation options and risks were discussed with the                         patient. All questions were answered and informed                         consent was obtained.                        - Patient identification and proposed procedure were                         verified prior to the procedure by the physician, the                         nurse, the anesthesiologist, the anesthetist and the                         technician. The procedure was verified in the                         procedure  room.                        After obtaining informed consent, the colonoscope was                         passed under direct vision. Throughout the procedure,                         the patient's blood pressure, pulse, and oxygen                         saturations were monitored  continuously. The                         Colonoscope was introduced through the anus and                         advanced to the the terminal ileum. The colonoscopy                         was performed with ease. The patient tolerated the                         procedure well. The quality of the bowel preparation                         was good. Findings:      The perianal and digital rectal examinations were normal.      Two sessile polyps were found in the ascending colon. The polyps were 3       to 4 mm in size. These polyps were removed with a cold biopsy forceps.       Resection and retrieval were complete.      Three flat polyps were found in the descending colon. The polyps were 5       to 6 mm in size. These polyps were removed with a cold snare. Resection       and retrieval were complete.      A 3 mm polyp was found in the descending colon. The polyp was sessile.       The polyp was removed with a cold biopsy forceps. Resection and       retrieval were complete.      Diverticula were found in the sigmoid colon.      The exam was otherwise without abnormality.      The rectum, sigmoid colon, descending colon, transverse colon, ascending       colon and cecum appeared normal. Biopsies for histology were taken with       a cold forceps for evaluation of microscopic colitis.      Non-bleeding internal hemorrhoids were found during retroflexion.      No additional abnormalities were found on retroflexion. Impression:            - Two 3 to 4 mm polyps in the ascending colon, removed                         with a cold biopsy forceps. Resected and retrieved.                        -  Three 5 to 6 mm polyps in the descending colon,                         removed with a cold snare. Resected and retrieved.                        - One 3 mm polyp in the descending colon, removed with                         a cold biopsy forceps. Resected and retrieved.                        -  Diverticulosis in the sigmoid colon.                        - The examination was otherwise normal.                        - The rectum, sigmoid colon, descending colon,                         transverse colon, ascending colon and cecum are                         normal. Biopsied.                        - Non-bleeding internal hemorrhoids. Recommendation:        - Discharge patient to home (with escort).                        - Advance diet as tolerated.                        - Continue present medications.                        - Await pathology results.                        - Repeat colonoscopy date to be determined after                         pending pathology results are reviewed.                        - The findings and recommendations were discussed with                         the patient.                        - The findings and recommendations were discussed with                         the patient's family.                        - Return to primary care physician as previously  scheduled.                        - High fiber diet. Procedure Code(s):     --- Professional ---                        [phone], Colonoscopy, flexible; with removal of                         tumor(s), polyp(s), or other lesion(s) by snare                         technique                        [id number], 43, Colonoscopy, flexible; with biopsy, single                         or multiple Diagnosis Code(s):     --- Professional ---                        K63.5, Polyp of colon                        Z12.11, Encounter for screening for malignant neoplasm                         of colon CPT copyright 2019 American Medical Association. All rights reserved. The codes documented in this report are preliminary and upon coder review may  be revised to meet current compliance requirements.  [name], MD Margretta Sidle B. Bonna Gains MD, MD 08/17/2020 10:51:32 AM This report has  been signed electronically. Number of Addenda: 0 Note Initiated On: 08/17/2020 9:28 AM Scope Withdrawal Time: 0 hours 30 minutes 6 seconds  Total Procedure Duration: 0 hours 42 minutes 35 seconds  Estimated Blood Loss:  Estimated blood loss: none.      [facility]

## 2020-08-17 NOTE — H&P (Signed)
Linda Antigua, MD [address], [place], [place], [place], [address] [address], [place], [place], [place], [address] Phone: [phone]  Fax: [phone]  Primary Care Physician:  Steele Sizer, MD   Pre-Procedure History & Physical: HPI:  GILLIE FLEITES is a 65 y.o. female is here for a colonoscopy.   Past Medical History:  Diagnosis Date  . Abnormal ankle brachial index (ABI) 12/02/2016  . ADHD (attention deficit hyperactivity disorder)   . AKI (acute kidney injury) ([place])   . Allergy 2020   Can't take amlopine. Causes swelling  . Anxiety   . Aortic atherosclerosis ([place]) 12/02/2016   July 2018  . Arthritis   . Chronic back pain   . Coronary artery disease   . Depression 2020   I think everyone is depressed at this unprecedented time!  . Encounter for HIV (human immunodeficiency virus) test   . Essential (hemorrhagic) thrombocythemia ([place]) 05/08/2020  . GERD (gastroesophageal reflux disease)   . Herpes genitalis in women   . History of stomach ulcers    x7 this year. Bleeding  . HLD (hyperlipidemia)   . HYPERLIPIDEMIA-MIXED   . Hypertension   . Prediabetes 12/02/2016  . Pulmonary hypertension ([place]) 03/09/2018   Echo Oct 2019  . 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preiser), left ([place])   . Subclinical hypothyroidism   . Subclinical hypothyroidism   . Ulcer     Past Surgical History:  Procedure Laterality Date  . APPENDECTOMY    . CARDIAC CATHETERIZATION    . CESAREAN SECTION     x2  . COLON SURGERY     interception  . CORONARY ANGIOPLASTY     stents...  2000  . ESOPHAGOGASTRODUODENOSCOPY (EGD) WITH PROPOFOL N/A 09/21/2015   Procedure: ESOPHAGOGASTRODUODENOSCOPY (EGD) WITH PROPOFOL;  Surgeon: Lucilla Lame, MD;  Location: [facility] ENDOSCOPY;  Service: Endoscopy;  Laterality: N/A;  . ESOPHAGOGASTRODUODENOSCOPY (EGD) WITH PROPOFOL N/A 07/30/2016   Procedure: ESOPHAGOGASTRODUODENOSCOPY (EGD) WITH PROPOFOL;  Surgeon: Jonathon Bellows, MD;  Location: [facility] ENDOSCOPY;  Service:  Endoscopy;  Laterality: N/A;  . ESOPHAGOGASTRODUODENOSCOPY (EGD) WITH PROPOFOL N/A 04/23/2018   Procedure: ESOPHAGOGASTRODUODENOSCOPY (EGD) WITH PROPOFOL;  Surgeon: Virgel Manifold, MD;  Location: [facility] ENDOSCOPY;  Service: Endoscopy;  Laterality: N/A;  . ESOPHAGOGASTRODUODENOSCOPY (EGD) WITH PROPOFOL N/A 11/04/2019   Procedure: ESOPHAGOGASTRODUODENOSCOPY (EGD) WITH PROPOFOL;  Surgeon: Lin Landsman, MD;  Location: [facility] ENDOSCOPY;  Service: Gastroenterology;  Laterality: N/A;  . EYE SURGERY  Dec 2021   Droopy eye lids  . LEFT HEART CATH Right 01/25/2018   Procedure: Left Heart Cath and Coronary Angiography;  Surgeon: Dionisio David, MD;  Location: [facility];  Service: Cardiovascular;  Laterality: Right;  . LEFT HEART CATH AND CORONARY ANGIOGRAPHY Right 08/12/2016   Procedure: Left Heart Cath and Coronary Angiography;  Surgeon: Dionisio David, MD;  Location: [facility];  Service: Cardiovascular;  Laterality: Right;  . SMALL INTESTINE SURGERY    . TONSILLECTOMY    . TUBAL LIGATION      Prior to Admission medications   Medication Sig Start Date End Date Taking? Authorizing Provider  acyclovir (ZOVIRAX) 400 MG tablet Take 1 tablet (400 mg total) by mouth 2 (two) times daily. 04/20/20  Yes Towanda Malkin, MD  amphetamine-dextroamphetamine (ADDERALL) 30 MG tablet Take 30 mg by mouth 2 (two) times daily. 12/02/16  Yes Myer Haff, MD  aspirin EC 81 MG tablet Take 81 mg by mouth daily.   Yes [provider]  diazepam (VALIUM) 10 MG tablet Take  10 mg by mouth 3 (three) times daily as needed.   Yes Myer Haff, MD  fluticasone (FLONASE) 50 MCG/ACT nasal spray Place 2 sprays into both nostrils daily as needed for allergies. 12/12/19  Yes Towanda Malkin, MD  metoprolol tartrate (LOPRESSOR) 50 MG tablet Take 1 tablet (50 mg total) by mouth 2 (two) times daily. 03/13/20  Yes Towanda Malkin, MD  Na Sulfate-K Sulfate-Mg Sulf 17.5-3.13-1.6 GM/177ML  SOLN At 5 PM the day before procedure take 1 bottle and 5 hours before procedure take 1 bottle. 07/18/20  Yes Linda Mejia B, MD  nystatin (MYCOSTATIN/NYSTOP) powder Apply topically 2 (two) times daily. 05/08/20  Yes Sowles, Drue Stager, MD  pantoprazole (PROTONIX) 40 MG tablet Take 1 tablet (40 mg total) by mouth daily. 05/08/20  Yes Sowles, Drue Stager, MD  cholecalciferol (VITAMIN D3) 25 MCG (1000 UNIT) tablet Take 1,000 Units by mouth daily. Patient not taking: Reported on 07/18/2020    [provider]  cyanocobalamin 1000 MCG tablet Take 1,000 mcg by mouth daily. Patient not taking: Reported on 07/18/2020    [provider]    Allergies as of 07/18/2020 - Review Complete 07/18/2020  Allergen Reaction Noted  . Amlodipine  10/24/2019  . Statins Other (See Comments) 11/04/2019    Family History  Problem Relation Age of Onset  . Diabetes Mother   . CVA Mother   . Heart disease Father   . Heart disease Sister   . Breast cancer Sister   . Heart disease Brother   . Heart disease Sister   . Heart disease Sister   . Heart disease Sister   . Heart disease Brother   . Heart disease Brother   . Heart disease Brother   . Heart disease Brother   . Healthy Daughter   . Healthy Daughter     Social History   Socioeconomic History  . Marital status: Divorced    Spouse name: Not on file  . Number of children: 2  . Years of education: Not on file  . Highest education level: GED or equivalent  Occupational History  . Occupation: disabled  Tobacco Use  . Smoking status: Former Smoker    Packs/day: 1.00    Years: 30.00    Pack years: 30.00    Types: Cigarettes    Quit date: 2006    Years since quitting: 16.3  . Smokeless tobacco: Never Used  . Tobacco comment: I quit smoking cigarettes  Vaping Use  . Vaping Use: Never used  Substance and Sexual Activity  . Alcohol use: Yes    Alcohol/week: 1.0 standard drink    Types: 1 Glasses of wine per week    Comment:  occasionally   . Drug use: No  . Sexual activity: Not Currently    Birth control/protection: Post-menopausal, None  Other Topics Concern  . Not on file  Social History Narrative  . Not on file   Social Determinants of Health   Financial Resource Strain: Low Risk   . Difficulty of Paying Living Expenses: Not hard at all  Food Insecurity: No Food Insecurity  . Worried About Charity fundraiser in the Last Year: Never true  . Ran Out of Food in the Last Year: Never true  Transportation Needs: No Transportation Needs  . Lack of Transportation (Medical): No  . Lack of Transportation (Non-Medical): No  Physical Activity: Inactive  . Days of Exercise per Week: 0 days  . Minutes of Exercise per Session: 0 min  Stress:  Stress Concern Present  . Feeling of Stress : Very much  Social Connections: Unknown  . Frequency of Communication with Friends and Family: Patient refused  . Frequency of Social Gatherings with Friends and Family: Patient refused  . Attends Religious Services: Patient refused  . Active Member of Clubs or Organizations: Patient refused  . Attends Archivist Meetings: Patient refused  . Marital Status: Divorced  Human resources officer Violence: Not At Risk  . Fear of Current or Ex-Partner: No  . Emotionally Abused: No  . Physically Abused: No  . Sexually Abused: No    Review of Systems: See HPI, otherwise negative ROS  Physical Exam: BP (!) 150/110   Pulse 67   Temp (!) 97.3 F (36.3 C) (Skin)   Resp 20   Ht 5\' 5"  (1.651 m)   Wt 92.5 kg   SpO2 99%   BMI 33.95 kg/m  General:   Alert,  pleasant and cooperative in NAD Head:  Normocephalic and atraumatic. Neck:  Supple; no masses or thyromegaly. Lungs:  Clear throughout to auscultation, normal respiratory effort.    Heart:  +S1, +S2, Regular rate and rhythm, No edema. Abdomen:  Soft, nontender and nondistended. Normal bowel sounds, without guarding, and without rebound.   Neurologic:  Alert and  oriented  x4;  grossly normal neurologically.  Impression/Plan: AVAREY YAEGER is here for a colonoscopy to be performed for average risk screening.  Risks, benefits, limitations, and alternatives regarding  colonoscopy have been reviewed with the patient.  Questions have been answered.  All parties agreeable.   Virgel Manifold, MD  08/17/2020, 9:28 AM

## 2020-08-17 NOTE — Anesthesia Postprocedure Evaluation (Signed)
Anesthesia Post Note  Patient: Linda Mejia  Procedure(s) Performed: COLONOSCOPY WITH PROPOFOL (N/A )  Patient location during evaluation: PACU Anesthesia Type: General Level of consciousness: awake and alert Pain management: pain level controlled Vital Signs Assessment: post-procedure vital signs reviewed and stable Respiratory status: spontaneous breathing, nonlabored ventilation, respiratory function stable and patient connected to nasal cannula oxygen Cardiovascular status: blood pressure returned to baseline and stable Postop Assessment: no apparent nausea or vomiting Anesthetic complications: no   No complications documented.   Last Vitals:  Vitals:   08/17/20 1049 08/17/20 1059  BP: 138/74   Pulse: (!) 36 (!) 53  Resp: 17 11  Temp:    SpO2: 93% 94%    Last Pain:  Vitals:   08/17/20 1059  TempSrc:   PainSc: 0-No pain                 Molli Barrows

## 2020-08-17 NOTE — Transfer of Care (Signed)
Immediate Anesthesia Transfer of Care Note  Patient: Linda Mejia  Procedure(s) Performed: COLONOSCOPY WITH PROPOFOL (N/A )  Patient Location: Endoscopy Unit  Anesthesia Type:General  Level of Consciousness: awake  Airway & Oxygen Therapy: Patient Spontanous Breathing  Post-op Assessment: Post -op Vital signs reviewed and stable  Post vital signs: stable  Last Vitals:  Vitals Value Taken Time  BP 128/98 08/17/20 1029  Temp    Pulse 65 08/17/20 1033  Resp 15 08/17/20 1033  SpO2 93 % 08/17/20 1033  Vitals shown include unvalidated device data.  Last Pain:  Vitals:   08/17/20 1029  TempSrc:   PainSc: 0-No pain         Complications: No complications documented.

## 2020-08-20 ENCOUNTER — Encounter: Payer: Self-pay | Admitting: Gastroenterology

## 2020-08-20 LAB — SURGICAL PATHOLOGY

## 2020-08-21 ENCOUNTER — Other Ambulatory Visit: Payer: Self-pay | Admitting: Gastroenterology

## 2020-08-21 ENCOUNTER — Encounter: Payer: Self-pay | Admitting: Gastroenterology

## 2020-08-21 DIAGNOSIS — R197 Diarrhea, unspecified: Secondary | ICD-10-CM

## 2020-08-22 ENCOUNTER — Encounter: Payer: Self-pay | Admitting: Family Medicine

## 2020-08-22 ENCOUNTER — Telehealth (INDEPENDENT_AMBULATORY_CARE_PROVIDER_SITE_OTHER): Payer: Medicare Other | Admitting: Family Medicine

## 2020-08-22 ENCOUNTER — Other Ambulatory Visit: Payer: Self-pay

## 2020-08-22 ENCOUNTER — Telehealth: Payer: Self-pay

## 2020-08-22 ENCOUNTER — Inpatient Hospital Stay: Payer: Medicare Other | Admitting: Family Medicine

## 2020-08-22 ENCOUNTER — Other Ambulatory Visit (HOSPITAL_COMMUNITY)
Admission: RE | Admit: 2020-08-22 | Discharge: 2020-08-22 | Disposition: A | Discharge status: Home / Self Care | Payer: Medicare Other | Source: Ambulatory Visit | Attending: Family Medicine | Admitting: Family Medicine

## 2020-08-22 VITALS — BP 138/88 | HR 70 | Temp 97.8°F | Resp 16 | Ht 65.0 in | Wt 207.0 lb

## 2020-08-22 DIAGNOSIS — Z124 Encounter for screening for malignant neoplasm of cervix: Secondary | ICD-10-CM

## 2020-08-22 DIAGNOSIS — D692 Other nonthrombocytopenic purpura: Secondary | ICD-10-CM | POA: Diagnosis not present

## 2020-08-22 DIAGNOSIS — Z0000 Encounter for general adult medical examination without abnormal findings: Secondary | ICD-10-CM

## 2020-08-22 DIAGNOSIS — Z23 Encounter for immunization: Secondary | ICD-10-CM

## 2020-08-22 DIAGNOSIS — Z1231 Encounter for screening mammogram for malignant neoplasm of breast: Secondary | ICD-10-CM | POA: Diagnosis not present

## 2020-08-22 DIAGNOSIS — Z1151 Encounter for screening for human papillomavirus (HPV): Secondary | ICD-10-CM | POA: Insufficient documentation

## 2020-08-22 DIAGNOSIS — Z01419 Encounter for gynecological examination (general) (routine) without abnormal findings: Secondary | ICD-10-CM | POA: Diagnosis present

## 2020-08-22 MED ORDER — SHINGRIX 50 MCG/0.5ML IM SUSR: 0.5000 mL | Freq: Once | INTRAMUSCULAR | 1 refills | Status: AC

## 2020-08-22 NOTE — Telephone Encounter (Signed)
Spoke with Linda Mejia she was confused as to why visit was changed to CPE, I clarified the reason for the visit change. She verbalized understanding and was appreciative for the clarification. She had no further questions or concerns at this time.    Copied from [place] [phone]. Topic: General - Other >> Aug 22, 2020 12:56 PM Leward Quan A wrote: Reason for CRM: Patient daughter Christena Deem called in asking to speak to Dr Ancil Boozer or her nurse to discuss visit from today did not elaborate. She hope to get a call back today   please call Ph# [phone]

## 2020-08-22 NOTE — Progress Notes (Deleted)
Name: Linda Mejia   MRN: [id number]    DOB: [birth year]   Date:08/22/2020       Progress Note  [facility] Follow Up  I connected with  Linda Mejia on 08/22/20 at 11:00 AM EDT by telephone and verified that I am speaking with the correct person using two identifiers.  I discussed the limitations, risks, security and privacy concerns of performing an evaluation and management service by telephone and the availability of in person appointments. Staff also discussed with the patient that there may be a patient responsible charge related to this service. Patient agreed on having a virtual visit  Patient Location: *** Provider Location: *** Additional Individuals present: ***  HPI  *** Patient Active Problem List   Diagnosis Date Noted  . Screen for colon cancer   . Polyp of ascending colon   . Polyp of sigmoid colon   . Microscopic colitis   . Polyp of descending colon   . Intractable vomiting with nausea 06/23/2020  . Severe sepsis ([place]) 06/23/2020  . Essential (hemorrhagic) thrombocythemia ([place]) 05/08/2020  . Osteoarthritis of multiple joints 01/05/2020  . Anxiety and depression 11/14/2019  . 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 ANA positive 09/22/2018  . 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preiser), left ([place]) 09/22/2018  . Tenosynovitis of hand 09/22/2018  . Stomach irritation   . Pulmonary hypertension ([place]) 03/09/2018  . Unstable angina ([place]) 01/23/2018  . Coronary artery calcification seen on CT scan 12/02/2016  . Aortic atherosclerosis ([place]) 12/02/2016  . Abnormal ankle brachial index (ABI) 12/02/2016  . Prediabetes 12/02/2016  . History of tobacco use 06/09/2016  . Degenerative arthritis of knee, bilateral 12/27/2015  . Class 2 severe obesity due to excess calories with serious comorbidity and body mass index (BMI) of 36.0 to 36.9 in adult ([place]) 12/27/2015  . Reflux esophagitis   . Polyarthralgia 06/05/2015  .  Subclinical hypothyroidism 03/26/2015  . Herpes simplex type 2 infection 03/21/2015  . Rhinitis, allergic 03/21/2015  . ADHD (attention deficit hyperactivity disorder), inattentive type 09/20/2014  . Episodic paroxysmal anxiety disorder 09/20/2014  . History of gastric ulcer 09/20/2014  . Substance abuse ([place]) 09/20/2014  . HLD (hyperlipidemia) 10/03/2009  . Coronary artery disease involving native coronary artery of native heart with angina pectoris ([place]) 10/02/2009  . Hypertension goal BP (blood pressure) < 140/90 10/02/2009    Past Surgical History:  Procedure Laterality Date  . APPENDECTOMY    . CARDIAC CATHETERIZATION    . CESAREAN SECTION     x2  . COLON SURGERY     interception  . COLONOSCOPY WITH PROPOFOL N/A 08/17/2020   Procedure: COLONOSCOPY WITH PROPOFOL;  Surgeon: Virgel Manifold, MD;  Location: [facility] ENDOSCOPY;  Service: Endoscopy;  Laterality: N/A;  . CORONARY ANGIOPLASTY     stents...  2000  . ESOPHAGOGASTRODUODENOSCOPY (EGD) WITH PROPOFOL N/A 09/21/2015   Procedure: ESOPHAGOGASTRODUODENOSCOPY (EGD) WITH PROPOFOL;  Surgeon: Lucilla Lame, MD;  Location: [facility] ENDOSCOPY;  Service: Endoscopy;  Laterality: N/A;  . ESOPHAGOGASTRODUODENOSCOPY (EGD) WITH PROPOFOL N/A 07/30/2016   Procedure: ESOPHAGOGASTRODUODENOSCOPY (EGD) WITH PROPOFOL;  Surgeon: Jonathon Bellows, MD;  Location: [facility] ENDOSCOPY;  Service: Endoscopy;  Laterality: N/A;  . ESOPHAGOGASTRODUODENOSCOPY (EGD) WITH PROPOFOL N/A 04/23/2018   Procedure: ESOPHAGOGASTRODUODENOSCOPY (EGD) WITH PROPOFOL;  Surgeon: Virgel Manifold, MD;  Location: [facility] ENDOSCOPY;  Service: Endoscopy;  Laterality: N/A;  . ESOPHAGOGASTRODUODENOSCOPY (EGD) WITH PROPOFOL N/A 11/04/2019   Procedure: ESOPHAGOGASTRODUODENOSCOPY (EGD) WITH PROPOFOL;  Surgeon:  Lin Landsman, MD;  Location: [facility] ENDOSCOPY;  Service: Gastroenterology;  Laterality: N/A;  . EYE SURGERY  Dec 2021   Droopy eye lids  . LEFT HEART CATH Right 01/25/2018   Procedure: Left  Heart Cath and Coronary Angiography;  Surgeon: Dionisio David, MD;  Location: [facility];  Service: Cardiovascular;  Laterality: Right;  . LEFT HEART CATH AND CORONARY ANGIOGRAPHY Right 08/12/2016   Procedure: Left Heart Cath and Coronary Angiography;  Surgeon: Dionisio David, MD;  Location: [facility];  Service: Cardiovascular;  Laterality: Right;  . SMALL INTESTINE SURGERY    . TONSILLECTOMY    . TUBAL LIGATION      Family History  Problem Relation Age of Onset  . Diabetes Mother   . CVA Mother   . Heart disease Father   . Heart disease Sister   . Breast cancer Sister   . Heart disease Brother   . Heart disease Sister   . Heart disease Sister   . Heart disease Sister   . Heart disease Brother   . Heart disease Brother   . Heart disease Brother   . Heart disease Brother   . Healthy Daughter   . Healthy Daughter     Social History   Socioeconomic History  . Marital status: Divorced    Spouse name: Not on file  . Number of children: 2  . Years of education: Not on file  . Highest education level: GED or equivalent  Occupational History  . Occupation: disabled  Tobacco Use  . Smoking status: Former Smoker    Packs/day: 1.00    Years: 30.00    Pack years: 30.00    Types: Cigarettes    Quit date: 2006    Years since quitting: 16.3  . Smokeless tobacco: Never Used  . Tobacco comment: I quit smoking cigarettes  Vaping Use  . Vaping Use: Never used  Substance and Sexual Activity  . Alcohol use: Yes    Alcohol/week: 1.0 standard drink    Types: 1 Glasses of wine per week    Comment: occasionally   . Drug use: No  . Sexual activity: Not Currently    Birth control/protection: Post-menopausal, None  Other Topics Concern  . Not on file  Social History Narrative  . Not on file   Social Determinants of Health   Financial Resource Strain: Low Risk   . Difficulty of Paying Living Expenses: Not hard at all  Food Insecurity: No Food Insecurity  .  Worried About Charity fundraiser in the Last Year: Never true  . Ran Out of Food in the Last Year: Never true  Transportation Needs: No Transportation Needs  . Lack of Transportation (Medical): No  . Lack of Transportation (Non-Medical): No  Physical Activity: Inactive  . Days of Exercise per Week: 0 days  . Minutes of Exercise per Session: 0 min  Stress: Stress Concern Present  . Feeling of Stress : Very much  Social Connections: Unknown  . Frequency of Communication with Friends and Family: Patient refused  . Frequency of Social Gatherings with Friends and Family: Patient refused  . Attends Religious Services: Patient refused  . Active Member of Clubs or Organizations: Patient refused  . Attends Archivist Meetings: Patient refused  . Marital Status: Divorced  Human resources officer Violence: Not At Risk  . Fear of Current or Ex-Partner: No  . Emotionally Abused: No  . Physically Abused: No  . Sexually Abused: No  Current Outpatient Medications:  .  acyclovir (ZOVIRAX) 400 MG tablet, Take 1 tablet (400 mg total) by mouth 2 (two) times daily., Disp: 60 tablet, Rfl: 5 .  amphetamine-dextroamphetamine (ADDERALL) 30 MG tablet, Take 30 mg by mouth 2 (two) times daily., Disp: , Rfl:  .  aspirin EC 81 MG tablet, Take 81 mg by mouth daily., Disp: , Rfl:  .  cholecalciferol (VITAMIN D3) 25 MCG (1000 UNIT) tablet, Take 1,000 Units by mouth daily. (Patient not taking: Reported on 07/18/2020), Disp: , Rfl:  .  cyanocobalamin 1000 MCG tablet, Take 1,000 mcg by mouth daily. (Patient not taking: Reported on 07/18/2020), Disp: , Rfl:  .  diazepam (VALIUM) 10 MG tablet, Take 10 mg by mouth 3 (three) times daily as needed., Disp: , Rfl:  .  fluticasone (FLONASE) 50 MCG/ACT nasal spray, Place 2 sprays into both nostrils daily as needed for allergies., Disp: 16 g, Rfl: 11 .  metoprolol tartrate (LOPRESSOR) 50 MG tablet, Take 1 tablet (50 mg total) by mouth 2 (two) times daily., Disp: 60 tablet,  Rfl: 5 .  Na Sulfate-K Sulfate-Mg Sulf 17.5-3.13-1.6 GM/177ML SOLN, At 5 PM the day before procedure take 1 bottle and 5 hours before procedure take 1 bottle., Disp: 354 mL, Rfl: 0 .  nystatin (MYCOSTATIN/NYSTOP) powder, Apply topically 2 (two) times daily., Disp: 60 g, Rfl: 0 .  pantoprazole (PROTONIX) 40 MG tablet, Take 1 tablet (40 mg total) by mouth daily., Disp: 30 tablet, Rfl: 0  Allergies  Allergen Reactions  . Amlodipine     Leg swelling  . Statins Other (See Comments)    I personally reviewed {Reviewed:14835} with the patient/caregiver today.   ROS ***  Objective  Virtual encounter, vitals not obtained.  There is no height or weight on file to calculate BMI.  Physical Exam ***  No results found for this or any previous visit (from the past 72 hour(s)).  PHQ2/9: Depression screen [facility] 2/9 06/20/2020 05/08/2020 01/05/2020 12/22/2019 11/14/2019  Decreased Interest 0 3 3 3  0  Down, Depressed, Hopeless 0 2 - 3 1  PHQ - 2 Score 0 5 3 6 1   Altered sleeping 0 0 3 3 1   Tired, decreased energy 3 3 3  - 1  Change in appetite 1 1 3  0 0  Feeling bad or failure about yourself  0 3 3 3  0  Trouble concentrating 0 0 1 3 0  Moving slowly or fidgety/restless 0 0 3 3 0  Suicidal thoughts 0 0 0 1 0  PHQ-9 Score 4 12 19 19 3   Difficult doing work/chores Not difficult at all - Extremely dIfficult Very difficult Not difficult at all  Some recent data might be hidden   PHQ-2/9 Result is {gen negative/positive:315881}.    Fall Risk: Fall Risk  05/08/2020 01/05/2020 12/27/2019 12/22/2019 11/14/2019  Falls in the past year? 1 0 0 0 0  Number falls in past yr: 1 0 0 0 0  Injury with Fall? 1 0 0 0 0  Comment - - - - -  Risk for fall due to : - - - No Fall Risks -  Risk for fall due to: Comment - - - - -  Follow up - Falls evaluation completed Falls evaluation completed Falls prevention discussed -   ***  Assessment & Plan  There are no diagnoses linked to this encounter.  I discussed the  assessment and treatment plan with the patient. The patient was provided an opportunity to ask questions and all  were answered. The patient agreed with the plan and demonstrated an understanding of the instructions.   The patient was advised to call back or seek an in-person evaluation if the symptoms worsen or if the condition fails to improve as anticipated.  I provided *** minutes of non-face-to-face time during this encounter.  Steele Sizer, MD

## 2020-08-22 NOTE — Progress Notes (Deleted)
Name: Linda Mejia   MRN: [id number]    DOB: 22-Dec-1955   Date:08/22/2020       Progress Note  Subjective  Chief Complaint  Hospital Follow Up  HPI  ***  Patient Active Problem List   Diagnosis Date Noted  . Screen for colon cancer   . Polyp of ascending colon   . Polyp of sigmoid colon   . Microscopic colitis   . Polyp of descending colon   . Intractable vomiting with nausea 06/23/2020  . Severe sepsis (HCC) 06/23/2020  . Essential (hemorrhagic) thrombocythemia (HCC) 05/08/2020  . Osteoarthritis of multiple joints 01/05/2020  . Anxiety and depression 11/14/2019  . 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 ANA positive 09/22/2018  . 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preiser), left (HCC) 09/22/2018  . Tenosynovitis of hand 09/22/2018  . Stomach irritation   . Pulmonary hypertension (HCC) 03/09/2018  . Unstable angina (HCC) 01/23/2018  . Coronary artery calcification seen on CT scan 12/02/2016  . Aortic atherosclerosis (HCC) 12/02/2016  . Abnormal ankle brachial index (ABI) 12/02/2016  . Prediabetes 12/02/2016  . History of tobacco use 06/09/2016  . Degenerative arthritis of knee, bilateral 12/27/2015  . Class 2 severe obesity due to excess calories with serious comorbidity and body mass index (BMI) of 36.0 to 36.9 in adult (HCC) 12/27/2015  . Reflux esophagitis   . Polyarthralgia 06/05/2015  . Subclinical hypothyroidism 03/26/2015  . Herpes simplex type 2 infection 03/21/2015  . Rhinitis, allergic 03/21/2015  . ADHD (attention deficit hyperactivity disorder), inattentive type 09/20/2014  . Episodic paroxysmal anxiety disorder 09/20/2014  . History of gastric ulcer 09/20/2014  . Substance abuse (HCC) 09/20/2014  . HLD (hyperlipidemia) 10/03/2009  . Coronary artery disease involving native coronary artery of native heart with angina pectoris (HCC) 10/02/2009  . Hypertension goal BP (blood pressure) < 140/90 10/02/2009    Past Surgical History:   Procedure Laterality Date  . APPENDECTOMY    . CARDIAC CATHETERIZATION    . CESAREAN SECTION     x2  . COLON SURGERY     interception  . COLONOSCOPY WITH PROPOFOL N/A 08/17/2020   Procedure: COLONOSCOPY WITH PROPOFOL;  Surgeon: Pasty Spillers, MD;  Location: [facility] ENDOSCOPY;  Service: Endoscopy;  Laterality: N/A;  . CORONARY ANGIOPLASTY     stents...  2000  . ESOPHAGOGASTRODUODENOSCOPY (EGD) WITH PROPOFOL N/A 09/21/2015   Procedure: ESOPHAGOGASTRODUODENOSCOPY (EGD) WITH PROPOFOL;  Surgeon: Midge Minium, MD;  Location: [facility] ENDOSCOPY;  Service: Endoscopy;  Laterality: N/A;  . ESOPHAGOGASTRODUODENOSCOPY (EGD) WITH PROPOFOL N/A 07/30/2016   Procedure: ESOPHAGOGASTRODUODENOSCOPY (EGD) WITH PROPOFOL;  Surgeon: Wyline Mood, MD;  Location: [facility] ENDOSCOPY;  Service: Endoscopy;  Laterality: N/A;  . ESOPHAGOGASTRODUODENOSCOPY (EGD) WITH PROPOFOL N/A 04/23/2018   Procedure: ESOPHAGOGASTRODUODENOSCOPY (EGD) WITH PROPOFOL;  Surgeon: Pasty Spillers, MD;  Location: [facility] ENDOSCOPY;  Service: Endoscopy;  Laterality: N/A;  . ESOPHAGOGASTRODUODENOSCOPY (EGD) WITH PROPOFOL N/A 11/04/2019   Procedure: ESOPHAGOGASTRODUODENOSCOPY (EGD) WITH PROPOFOL;  Surgeon: Toney Reil, MD;  Location: [facility] ENDOSCOPY;  Service: Gastroenterology;  Laterality: N/A;  . EYE SURGERY  Dec 2021   Droopy eye lids  . LEFT HEART CATH Right 01/25/2018   Procedure: Left Heart Cath and Coronary Angiography;  Surgeon: Laurier Nancy, MD;  Location: [facility] INVASIVE CV LAB;  Service: Cardiovascular;  Laterality: Right;  . LEFT HEART CATH AND CORONARY ANGIOGRAPHY Right 08/12/2016   Procedure: Left Heart Cath and Coronary Angiography;  Surgeon: Laurier Nancy, MD;  Location: [facility] INVASIVE CV LAB;  Service: Cardiovascular;  Laterality: Right;  . SMALL INTESTINE SURGERY    . TONSILLECTOMY    . TUBAL LIGATION      Family History  Problem Relation Age of Onset  . Diabetes Mother   . CVA Mother   . Heart disease Father   . Heart  disease Sister   . Breast cancer Sister   . Heart disease Brother   . Heart disease Sister   . Heart disease Sister   . Heart disease Sister   . Heart disease Brother   . Heart disease Brother   . Heart disease Brother   . Heart disease Brother   . Healthy Daughter   . Healthy Daughter     Social History   Tobacco Use  . Smoking status: Former Smoker    Packs/day: 1.00    Years: 30.00    Pack years: 30.00    Types: Cigarettes    Quit date: 2006    Years since quitting: 16.3  . Smokeless tobacco: Never Used  . Tobacco comment: I quit smoking cigarettes  Substance Use Topics  . Alcohol use: Yes    Alcohol/week: 1.0 standard drink    Types: 1 Glasses of wine per week    Comment: occasionally      Current Outpatient Medications:  .  acyclovir (ZOVIRAX) 400 MG tablet, Take 1 tablet (400 mg total) by mouth 2 (two) times daily., Disp: 60 tablet, Rfl: 5 .  amphetamine-dextroamphetamine (ADDERALL) 30 MG tablet, Take 30 mg by mouth 2 (two) times daily., Disp: , Rfl:  .  aspirin EC 81 MG tablet, Take 81 mg by mouth daily., Disp: , Rfl:  .  cholecalciferol (VITAMIN D3) 25 MCG (1000 UNIT) tablet, Take 1,000 Units by mouth daily. (Patient not taking: Reported on 07/18/2020), Disp: , Rfl:  .  cyanocobalamin 1000 MCG tablet, Take 1,000 mcg by mouth daily. (Patient not taking: Reported on 07/18/2020), Disp: , Rfl:  .  diazepam (VALIUM) 10 MG tablet, Take 10 mg by mouth 3 (three) times daily as needed., Disp: , Rfl:  .  fluticasone (FLONASE) 50 MCG/ACT nasal spray, Place 2 sprays into both nostrils daily as needed for allergies., Disp: 16 g, Rfl: 11 .  metoprolol tartrate (LOPRESSOR) 50 MG tablet, Take 1 tablet (50 mg total) by mouth 2 (two) times daily., Disp: 60 tablet, Rfl: 5 .  Na Sulfate-K Sulfate-Mg Sulf 17.5-3.13-1.6 GM/177ML SOLN, At 5 PM the day before procedure take 1 bottle and 5 hours before procedure take 1 bottle., Disp: 354 mL, Rfl: 0 .  nystatin (MYCOSTATIN/NYSTOP) powder, Apply  topically 2 (two) times daily., Disp: 60 g, Rfl: 0 .  pantoprazole (PROTONIX) 40 MG tablet, Take 1 tablet (40 mg total) by mouth daily., Disp: 30 tablet, Rfl: 0  Allergies  Allergen Reactions  . Amlodipine     Leg swelling  . Statins Other (See Comments)    I personally reviewed {Reviewed:14835} with the patient/caregiver today.   ROS  ***  Objective  There were no vitals filed for this visit.  There is no height or weight on file to calculate BMI.  Physical Exam ***  Recent Results (from the past 2160 hour(s))  CBC with Differential     Status: Abnormal   Collection Time: 06/23/20  3:59 PM  Result Value Ref Range   WBC 15.3 (H) 4.0 - 10.5 K/uL   RBC 4.21 3.87 - 5.11 MIL/uL   Hemoglobin 13.2 12.0 - 15.0 g/dL   HCT 40.6 36.0 - 46.0 %  MCV 96.4 80.0 - 100.0 fL   MCH 31.4 26.0 - 34.0 pg   MCHC 32.5 30.0 - 36.0 g/dL   RDW 12.2 11.5 - 15.5 %   Platelets 322 150 - 400 K/uL   nRBC 0.0 0.0 - 0.2 %   Neutrophils Relative % 81 %   Neutro Abs 12.4 (H) 1.7 - 7.7 K/uL   Lymphocytes Relative 12 %   Lymphs Abs 1.8 0.7 - 4.0 K/uL   Monocytes Relative 7 %   Monocytes Absolute 1.0 0.1 - 1.0 K/uL   Eosinophils Relative 0 %   Eosinophils Absolute 0.1 0.0 - 0.5 K/uL   Basophils Relative 0 %   Basophils Absolute 0.1 0.0 - 0.1 K/uL   Immature Granulocytes 0 %   Abs Immature Granulocytes 0.06 0.00 - 0.07 K/uL    Comment: Performed at [facility], [address] [place], [place] [address]  Comprehensive metabolic panel     Status: Abnormal   Collection Time: 06/23/20  3:59 PM  Result Value Ref Range   Sodium 135 135 - 145 mmol/L   Potassium 4.2 3.5 - 5.1 mmol/L   Chloride 99 98 - 111 mmol/L   CO2 25 22 - 32 mmol/L   Glucose, Bld 128 (H) 70 - 99 mg/dL    Comment: Glucose reference range applies only to samples taken after fasting for at least 8 hours.   BUN 14 8 - 23 mg/dL   Creatinine, Ser 0.73 0.44 - 1.00 mg/dL   Calcium 8.2 (L) 8.9 - 10.3 mg/dL   Total Protein  7.4 6.5 - 8.1 g/dL   Albumin 4.0 3.5 - 5.0 g/dL   AST 27 15 - 41 U/L   ALT 14 0 - 44 U/L   Alkaline Phosphatase 82 38 - 126 U/L   Total Bilirubin 0.7 0.3 - 1.2 mg/dL   GFR, Estimated >60 >60 mL/min    Comment: (NOTE) Calculated using the CKD-EPI Creatinine Equation (2021)    Anion gap 11 5 - 15    Comment: Performed at [facility], [address] [place], [place] [address]  Resp Panel by RT-PCR (Flu A&B, Covid) Nasopharyngeal Swab     Status: None   Collection Time: 06/23/20  4:57 PM   Specimen: Nasopharyngeal Swab; Nasopharyngeal(NP) swabs in vial transport medium  Result Value Ref Range   SARS Coronavirus 2 by RT PCR NEGATIVE NEGATIVE    Comment: (NOTE) SARS-CoV-2 target nucleic acids are NOT DETECTED.  The SARS-CoV-2 RNA is generally detectable in upper respiratory specimens during the acute phase of infection. The lowest concentration of SARS-CoV-2 viral copies this assay can detect is 138 copies/mL. A negative result does not preclude SARS-Cov-2 infection and should not be used as the sole basis for treatment or other patient management decisions. A negative result may occur with  improper specimen collection/handling, submission of specimen other than nasopharyngeal swab, presence of viral mutation(s) within the areas targeted by this assay, and inadequate number of viral copies(<138 copies/mL). A negative result must be combined with clinical observations, patient history, and epidemiological information. The expected result is Negative.  Fact Sheet for Patients:  EntrepreneurPulse.com.au  Fact Sheet for Healthcare Providers:  IncredibleEmployment.be  This test is no t yet approved or cleared by the [place] FDA and  has been authorized for detection and/or diagnosis of SARS-CoV-2 by FDA under an Emergency Use Authorization (EUA). This EUA will remain  in effect (meaning this test can be used) for the duration of  the  COVID-19 declaration under Section 564(b)(1) of the Act, 21 U.S.C.section 360bbb-3(b)(1), unless the authorization is terminated  or revoked sooner.       Influenza A by PCR NEGATIVE NEGATIVE   Influenza B by PCR NEGATIVE NEGATIVE    Comment: (NOTE) The Xpert Xpress SARS-CoV-2/FLU/RSV plus assay is intended as an aid in the diagnosis of influenza from Nasopharyngeal swab specimens and should not be used as a sole basis for treatment. Nasal washings and aspirates are unacceptable for Xpert Xpress SARS-CoV-2/FLU/RSV testing.  Fact Sheet for Patients: EntrepreneurPulse.com.au  Fact Sheet for Healthcare Providers: IncredibleEmployment.be  This test is not yet approved or cleared by the [place] FDA and has been authorized for detection and/or diagnosis of SARS-CoV-2 by FDA under an Emergency Use Authorization (EUA). This EUA will remain in effect (meaning this test can be used) for the duration of the COVID-19 declaration under Section 564(b)(1) of the Act, 21 U.S.C. section 360bbb-3(b)(1), unless the authorization is terminated or revoked.  Performed at [facility], [address] [place], [place] [address]   Urinalysis, Complete w Microscopic Urine, Clean Catch     Status: Abnormal   Collection Time: 06/23/20  4:57 PM  Result Value Ref Range   Color, Urine COLORLESS (A) YELLOW   APPearance CLEAR (A) CLEAR   Specific Gravity, Urine 1.001 (L) 1.005 - 1.030   pH 7.0 5.0 - 8.0   Glucose, UA NEGATIVE NEGATIVE mg/dL   Hgb urine dipstick SMALL (A) NEGATIVE   Bilirubin Urine NEGATIVE NEGATIVE   Ketones, ur NEGATIVE NEGATIVE mg/dL   Protein, ur NEGATIVE NEGATIVE mg/dL   Nitrite NEGATIVE NEGATIVE   Leukocytes,Ua NEGATIVE NEGATIVE   WBC, UA NONE SEEN 0 - 5 WBC/hpf   Bacteria, UA NONE SEEN NONE SEEN   Squamous Epithelial / LPF NONE SEEN 0 - 5    Comment: Performed at [facility], [address] [place],  [place] [address]  Lactic acid, plasma     Status: None   Collection Time: 06/23/20  5:23 PM  Result Value Ref Range   Lactic Acid, Venous 1.2 0.5 - 1.9 mmol/L    Comment: Performed at [facility], [address] [place], [place] [address]  Lactic acid, plasma     Status: Abnormal   Collection Time: 06/23/20  5:23 PM  Result Value Ref Range   Lactic Acid, Venous 2.2 (HH) 0.5 - 1.9 mmol/L    Comment: CRITICAL RESULT CALLED TO, READ BACK BY AND VERIFIED WITH  LEXI OLIVER 06/23/20 @ 1815 ADL Performed at [facility], [address] [place], [place] [address]   Blood culture (routine x 2)     Status: None   Collection Time: 06/23/20  5:23 PM   Specimen: BLOOD  Result Value Ref Range   Specimen Description BLOOD RIGHT ARM    Special Requests      BOTTLES DRAWN AEROBIC AND ANAEROBIC Blood Culture adequate volume   Culture      NO GROWTH 5 DAYS Performed at [facility], [address] [place], [place] [address]    Report Status 06/28/2020 FINAL   Blood culture (routine x 2)     Status: None   Collection Time: 06/23/20  5:23 PM   Specimen: BLOOD  Result Value Ref Range   Specimen Description BLOOD RIGHT ARM    Special Requests      BOTTLES DRAWN AEROBIC AND ANAEROBIC Blood Culture results may not be optimal due to an excessive volume of blood received  in culture bottles   Culture      NO GROWTH 5 DAYS Performed at [facility], [address] [place], [place] [address]    Report Status 06/28/2020 FINAL   Procalcitonin - Baseline     Status: None   Collection Time: 06/23/20  5:23 PM  Result Value Ref Range   Procalcitonin <0.10 ng/mL    Comment:        Interpretation: PCT (Procalcitonin) <= 0.5 ng/mL: Systemic infection (sepsis) is not likely. Local bacterial infection is possible. (NOTE)       Sepsis PCT Algorithm           Lower Respiratory Tract                                      Infection PCT Algorithm     ----------------------------     ----------------------------         PCT < 0.25 ng/mL                PCT < 0.10 ng/mL          Strongly encourage             Strongly discourage   discontinuation of antibiotics    initiation of antibiotics    ----------------------------     -----------------------------       PCT 0.25 - 0.50 ng/mL            PCT 0.10 - 0.25 ng/mL               OR       >80% decrease in PCT            Discourage initiation of                                            antibiotics      Encourage discontinuation           of antibiotics    ----------------------------     -----------------------------         PCT >= 0.50 ng/mL              PCT 0.26 - 0.50 ng/mL               AND        <80% decrease in PCT             Encourage initiation of                                             antibiotics       Encourage continuation           of antibiotics    ----------------------------     -----------------------------        PCT >= 0.50 ng/mL                  PCT > 0.50 ng/mL               AND         increase in PCT                  Strongly encourage  initiation of antibiotics    Strongly encourage escalation           of antibiotics                                     -----------------------------                                           PCT <= 0.25 ng/mL                                                 OR                                        > 80% decrease in PCT                                      Discontinue / Do not initiate                                             antibiotics  Performed at [facility], [address] [place], [place] [address]   Lipase, blood     Status: None   Collection Time: 06/23/20 10:33 PM  Result Value Ref Range   Lipase 26 11 - 51 U/L    Comment: Performed at [facility], [address] [place], [place] [address]  C Difficile Quick Screen w PCR reflex     Status:  None   Collection Time: 06/23/20 10:33 PM   Specimen: STOOL  Result Value Ref Range   C Diff antigen NEGATIVE NEGATIVE   C Diff toxin NEGATIVE NEGATIVE   C Diff interpretation No C. difficile detected.     Comment: Performed at [facility], [address] [place], [place] [address]  Gastrointestinal Panel by PCR , Stool     Status: None   Collection Time: 06/23/20 10:33 PM   Specimen: Stool  Result Value Ref Range   Campylobacter species NOT DETECTED NOT DETECTED   Plesimonas shigelloides NOT DETECTED NOT DETECTED   Salmonella species NOT DETECTED NOT DETECTED   Yersinia enterocolitica NOT DETECTED NOT DETECTED   Vibrio species NOT DETECTED NOT DETECTED   Vibrio cholerae NOT DETECTED NOT DETECTED   Enteroaggregative E coli (EAEC) NOT DETECTED NOT DETECTED   Enteropathogenic E coli (EPEC) NOT DETECTED NOT DETECTED   Enterotoxigenic E coli (ETEC) NOT DETECTED NOT DETECTED   Shiga like toxin producing E coli (STEC) NOT DETECTED NOT DETECTED   Shigella/Enteroinvasive E coli (EIEC) NOT DETECTED NOT DETECTED   Cryptosporidium NOT DETECTED NOT DETECTED   Cyclospora cayetanensis NOT DETECTED NOT DETECTED   Entamoeba histolytica NOT DETECTED NOT DETECTED   Giardia lamblia NOT DETECTED NOT DETECTED   Adenovirus F40/41 NOT DETECTED NOT DETECTED   Astrovirus NOT DETECTED NOT DETECTED   Norovirus GI/GII NOT DETECTED NOT DETECTED  Rotavirus A NOT DETECTED NOT DETECTED   Sapovirus (I, II, IV, and V) NOT DETECTED NOT DETECTED    Comment: Performed at [facility], [address] [place], [place] [address]  HIV Antibody (routine testing w rflx)     Status: None   Collection Time: 06/23/20 10:33 PM  Result Value Ref Range   HIV Screen 4th Generation wRfx Non Reactive Non Reactive    Comment: Performed at [facility], [address], [place], [place] [address]  TSH     Status: None   Collection Time: 06/23/20 10:33 PM  Result Value Ref Range   TSH 0.892 0.350 -  4.500 uIU/mL    Comment: Performed by a 3rd Generation assay with a functional sensitivity of <=0.01 uIU/mL. Performed at [facility], [address] [place], [place] [address]   Lactic acid, plasma     Status: None   Collection Time: 06/23/20 10:33 PM  Result Value Ref Range   Lactic Acid, Venous 1.3 0.5 - 1.9 mmol/L    Comment: Performed at [facility], [address] [place], [place] [address]  Brain natriuretic peptide     Status: Abnormal   Collection Time: 06/23/20 10:33 PM  Result Value Ref Range   B Natriuretic Peptide 141.4 (H) 0.0 - 100.0 pg/mL    Comment: Performed at [facility], [address] [place], [place] [address]  Protime-INR     Status: None   Collection Time: 06/24/20 12:23 AM  Result Value Ref Range   Prothrombin Time 13.4 11.4 - 15.2 seconds   INR 1.1 0.8 - 1.2    Comment: (NOTE) INR goal varies based on device and disease states. Performed at [facility], [address] [place], [place] [address]   Procalcitonin     Status: None   Collection Time: 06/24/20 12:23 AM  Result Value Ref Range   Procalcitonin 0.12 ng/mL    Comment:        Interpretation: PCT (Procalcitonin) <= 0.5 ng/mL: Systemic infection (sepsis) is not likely. Local bacterial infection is possible. (NOTE)       Sepsis PCT Algorithm           Lower Respiratory Tract                                      Infection PCT Algorithm    ----------------------------     ----------------------------         PCT < 0.25 ng/mL                PCT < 0.10 ng/mL          Strongly encourage             Strongly discourage   discontinuation of antibiotics    initiation of antibiotics    ----------------------------     -----------------------------       PCT 0.25 - 0.50 ng/mL            PCT 0.10 - 0.25 ng/mL               OR       >80% decrease in PCT            Discourage initiation of  antibiotics       Encourage discontinuation           of antibiotics    ----------------------------     -----------------------------         PCT >= 0.50 ng/mL              PCT 0.26 - 0.50 ng/mL               AND        <80% decrease in PCT             Encourage initiation of                                             antibiotics       Encourage continuation           of antibiotics    ----------------------------     -----------------------------        PCT >= 0.50 ng/mL                  PCT > 0.50 ng/mL               AND         increase in PCT                  Strongly encourage                                      initiation of antibiotics    Strongly encourage escalation           of antibiotics                                     -----------------------------                                           PCT <= 0.25 ng/mL                                                 OR                                        > 80% decrease in PCT                                      Discontinue / Do not initiate                                             antibiotics  Performed at [facility], [address] [place], [place] [address]   Lactic acid, plasma     Status: None   Collection  Time: 06/24/20 12:23 AM  Result Value Ref Range   Lactic Acid, Venous 0.9 0.5 - 1.9 mmol/L    Comment: Performed at [facility], [address] [place], [place] [address]  Comprehensive metabolic panel     Status: Abnormal   Collection Time: 06/24/20 12:23 AM  Result Value Ref Range   Sodium 137 135 - 145 mmol/L   Potassium 3.5 3.5 - 5.1 mmol/L   Chloride 105 98 - 111 mmol/L   CO2 25 22 - 32 mmol/L   Glucose, Bld 109 (H) 70 - 99 mg/dL    Comment: Glucose reference range applies only to samples taken after fasting for at least 8 hours.   BUN 11 8 - 23 mg/dL   Creatinine, Ser 0.59 0.44 - 1.00 mg/dL   Calcium 7.6 (L) 8.9 - 10.3 mg/dL   Total Protein 6.3 (L) 6.5 - 8.1 g/dL   Albumin 3.4 (L) 3.5 -  5.0 g/dL   AST 18 15 - 41 U/L   ALT 13 0 - 44 U/L   Alkaline Phosphatase 75 38 - 126 U/L   Total Bilirubin 0.6 0.3 - 1.2 mg/dL   GFR, Estimated >60 >60 mL/min    Comment: (NOTE) Calculated using the CKD-EPI Creatinine Equation (2021)    Anion gap 7 5 - 15    Comment: Performed at [facility], [address] [place], [place] [address]  CBC     Status: Abnormal   Collection Time: 06/24/20 12:23 AM  Result Value Ref Range   WBC 13.9 (H) 4.0 - 10.5 K/uL   RBC 3.63 (L) 3.87 - 5.11 MIL/uL   Hemoglobin 11.3 (L) 12.0 - 15.0 g/dL   HCT 35.6 (L) 36.0 - 46.0 %   MCV 98.1 80.0 - 100.0 fL   MCH 31.1 26.0 - 34.0 pg   MCHC 31.7 30.0 - 36.0 g/dL   RDW 12.5 11.5 - 15.5 %   Platelets 269 150 - 400 K/uL   nRBC 0.0 0.0 - 0.2 %    Comment: Performed at [facility], [address] [place], [place] [address]  D-dimer, quantitative     Status: Abnormal   Collection Time: 06/24/20 12:23 AM  Result Value Ref Range   D-Dimer, Quant 0.62 (H) 0.00 - 0.50 ug/mL-FEU    Comment: (NOTE) At the manufacturer cut-off value of 0.5 g/mL FEU, this assay has a negative predictive value of 95-100%.This assay is intended for use in conjunction with a clinical pretest probability (PTP) assessment model to exclude pulmonary embolism (PE) and deep venous thrombosis (DVT) in outpatients suspected of PE or DVT. Results should be correlated with clinical presentation. Performed at [facility], [address] [place], [place] [address] CORRECTED ON 03/13 AT 0603: PREVIOUSLY REPORTED AS <0.27   Troponin I (High Sensitivity)     Status: None   Collection Time: 06/24/20 12:23 AM  Result Value Ref Range   Troponin I (High Sensitivity) 6 <18 ng/L    Comment: (NOTE) Elevated high sensitivity troponin I (hsTnI) values and significant  changes across serial measurements may suggest ACS but many other  chronic and acute conditions are known to elevate hsTnI results.  Refer to the "Links"  section for chest pain algorithms and additional  guidance. Performed at [facility], [place],  [address]   Troponin I (High Sensitivity)     Status: None   Collection Time: 06/24/20  5:17 AM  Result Value Ref Range   Troponin  I (High Sensitivity) 8 <18 ng/L    Comment: (NOTE) Elevated high sensitivity troponin I (hsTnI) values and significant  changes across serial measurements may suggest ACS but many other  chronic and acute conditions are known to elevate hsTnI results.  Refer to the "Links" section for chest pain algorithms and additional  guidance. Performed at [facility], [address] [place], [place] [address]   CBC     Status: Abnormal   Collection Time: 06/25/20  4:24 AM  Result Value Ref Range   WBC 5.5 4.0 - 10.5 K/uL   RBC 3.23 (L) 3.87 - 5.11 MIL/uL   Hemoglobin 10.3 (L) 12.0 - 15.0 g/dL   HCT 32.2 (L) 36.0 - 46.0 %   MCV 99.7 80.0 - 100.0 fL   MCH 31.9 26.0 - 34.0 pg   MCHC 32.0 30.0 - 36.0 g/dL   RDW 12.5 11.5 - 15.5 %   Platelets 223 150 - 400 K/uL   nRBC 0.0 0.0 - 0.2 %    Comment: Performed at [facility], [address], [place], [place] [address]  Basic metabolic panel     Status: Abnormal   Collection Time: 06/25/20  4:24 AM  Result Value Ref Range   Sodium 140 135 - 145 mmol/L   Potassium 4.0 3.5 - 5.1 mmol/L   Chloride 109 98 - 111 mmol/L   CO2 26 22 - 32 mmol/L   Glucose, Bld 95 70 - 99 mg/dL    Comment: Glucose reference range applies only to samples taken after fasting for at least 8 hours.   BUN 12 8 - 23 mg/dL   Creatinine, Ser 0.65 0.44 - 1.00 mg/dL   Calcium 7.6 (L) 8.9 - 10.3 mg/dL   GFR, Estimated >60 >60 mL/min    Comment: (NOTE) Calculated using the CKD-EPI Creatinine Equation (2021)    Anion gap 5 5 - 15    Comment: Performed at [facility], [address] [place], [place] [address]  Respiratory (~20 pathogens) panel by PCR     Status: None   Collection Time:  06/25/20 11:18 AM   Specimen: Nasopharyngeal Swab; Respiratory  Result Value Ref Range   Adenovirus NOT DETECTED NOT DETECTED   Coronavirus 229E NOT DETECTED NOT DETECTED    Comment: (NOTE) The Coronavirus on the Respiratory Panel, DOES NOT test for the novel  Coronavirus (2019 nCoV)    Coronavirus HKU1 NOT DETECTED NOT DETECTED   Coronavirus NL63 NOT DETECTED NOT DETECTED   Coronavirus OC43 NOT DETECTED NOT DETECTED   Metapneumovirus NOT DETECTED NOT DETECTED   Rhinovirus / Enterovirus NOT DETECTED NOT DETECTED   Influenza A NOT DETECTED NOT DETECTED   Influenza B NOT DETECTED NOT DETECTED   Parainfluenza Virus 1 NOT DETECTED NOT DETECTED   Parainfluenza Virus 2 NOT DETECTED NOT DETECTED   Parainfluenza Virus 3 NOT DETECTED NOT DETECTED   Parainfluenza Virus 4 NOT DETECTED NOT DETECTED   Respiratory Syncytial Virus NOT DETECTED NOT DETECTED   Bordetella pertussis NOT DETECTED NOT DETECTED   Bordetella Parapertussis NOT DETECTED NOT DETECTED   Chlamydophila pneumoniae NOT DETECTED NOT DETECTED   Mycoplasma pneumoniae NOT DETECTED NOT DETECTED    Comment: Performed at [facility] [address], [place], [place] [address]  SARS CORONAVIRUS 2 (TAT 6-24 HRS) Nasopharyngeal Nasopharyngeal Swab     Status: None   Collection Time: 06/25/20 11:18 AM   Specimen: Nasopharyngeal Swab  Result Value Ref Range   SARS Coronavirus 2 NEGATIVE  NEGATIVE    Comment: (NOTE) SARS-CoV-2 target nucleic acids are NOT DETECTED.  The SARS-CoV-2 RNA is generally detectable in upper and lower respiratory specimens during the acute phase of infection. Negative results do not preclude SARS-CoV-2 infection, do not rule out co-infections with other pathogens, and should not be used as the sole basis for treatment or other patient management decisions. Negative results must be combined with clinical observations, patient history, and epidemiological information. The expected result is  Negative.  Fact Sheet for Patients: HairSlick.nohttps://www.fda.gov/media/138098/download  Fact Sheet for Healthcare Providers: quierodirigir.comhttps://www.fda.gov/media/138095/download  This test is not yet approved or cleared by the [place] FDA and  has been authorized for detection and/or diagnosis of SARS-CoV-2 by FDA under an Emergency Use Authorization (EUA). This EUA will remain  in effect (meaning this test can be used) for the duration of the COVID-19 declaration under Se ction 564(b)(1) of the Act, 21 U.S.C. section 360bbb-3(b)(1), unless the authorization is terminated or revoked sooner.  Performed at [facility], [address] [place], [place] [address]   Occult blood card to lab, stool     Status: None   Collection Time: 06/25/20  6:07 PM  Result Value Ref Range   Fecal Occult Bld NEGATIVE NEGATIVE    Comment: Performed at [facility], [address] [place], [place] [address]  CBC     Status: Abnormal   Collection Time: 06/26/20  5:42 AM  Result Value Ref Range   WBC 3.3 (L) 4.0 - 10.5 K/uL   RBC 3.73 (L) 3.87 - 5.11 MIL/uL   Hemoglobin 11.8 (L) 12.0 - 15.0 g/dL   HCT 09.835.8 (L) 11.936.0 - 14.746.0 %   MCV 96.0 80.0 - 100.0 fL   MCH 31.6 26.0 - 34.0 pg   MCHC 33.0 30.0 - 36.0 g/dL   RDW 82.912.3 56.211.5 - 13.015.5 %   Platelets 214 150 - 400 K/uL   nRBC 0.0 0.0 - 0.2 %    Comment: Performed at [facility], [address] [place], [place] [address]  Basic metabolic panel     Status: Abnormal   Collection Time: 06/26/20  5:42 AM  Result Value Ref Range   Sodium 141 135 - 145 mmol/L   Potassium 3.6 3.5 - 5.1 mmol/L   Chloride 106 98 - 111 mmol/L   CO2 28 22 - 32 mmol/L   Glucose, Bld 92 70 - 99 mg/dL    Comment: Glucose reference range applies only to samples taken after fasting for at least 8 hours.   BUN 7 (L) 8 - 23 mg/dL   Creatinine, Ser 4.690.59 0.44 - 1.00 mg/dL   Calcium 8.2 (L) 8.9 - 10.3 mg/dL   GFR, Estimated >62>60 >95>60 mL/min    Comment: (NOTE) Calculated  using the CKD-EPI Creatinine Equation (2021)    Anion gap 7 5 - 15    Comment: Performed at [facility], [address] [place], [place] [address]  Iron and TIBC     Status: Abnormal   Collection Time: 06/26/20  5:42 AM  Result Value Ref Range   Iron 45 28 - 170 ug/dL   TIBC 244248 (L) 010250 - 272450 ug/dL   Saturation Ratios 18 10.4 - 31.8 %   UIBC 203 ug/dL    Comment: Performed at [facility], [address] [place], [place] [address]  Ferritin     Status: None   Collection Time: 06/26/20  5:42 AM  Result Value Ref Range  Ferritin 155 11 - 307 ng/mL    Comment: Performed at [facility], [address] [place], [place] [address]  Vitamin B12     Status: None   Collection Time: 06/26/20  5:42 AM  Result Value Ref Range   Vitamin B-12 490 180 - 914 pg/mL    Comment: (NOTE) This assay is not validated for testing neonatal or myeloproliferative syndrome specimens for Vitamin B12 levels. Performed at [facility], [place] [address], [place], [place] [address]   Surgical pathology     Status: None   Collection Time: 08/17/20  9:56 AM  Result Value Ref Range   SURGICAL PATHOLOGY      SURGICAL PATHOLOGY CASE: [id number] PATIENT: Sakshi Forsee Surgical Pathology Report     Specimen Submitted: A. Colon, random; cbx B. Colon polyp x6, as cbx(2) des coldS(3) des cbx(1)  Clinical History: Screening colonoscopy Z12.11.  Colon polyps, diverticulosis, hemorrhoids      DIAGNOSIS: A. COLON, RANDOM; COLD BIOPSY: - COLONIC MUCOSA WITH NO SIGNIFICANT PATHOLOGIC ALTERATION. - NEGATIVE FOR ACTIVE INFLAMMATION AND FEATURES OF CHRONICITY. - NEGATIVE FOR MICROSCOPIC COLITIS, DYSPLASIA, AND MALIGNANCY.  B. COLON POLYP X6, ASCENDING AND DESCENDING; COLD BIOPSY AND COLD SNARE: - TUBULAR ADENOMA, MULTIPLE FRAGMENTS. - NEGATIVE FOR HIGH-GRADE DYSPLASIA AND MALIGNANCY.  GROSS DESCRIPTION: A. Labeled: Random colon cold biopsies, rule out microscopic  colitis Received: In formalin Collection time: 9:56 AM on 08/17/2020 Placed into formalin time: 9:56 AM on 08/17/2020 Tissue fragment(s): 4 Size: 1 x 0.3 x 0.1 cm Description: Aggregate of tan tissue fragments Enti rely submitted in one cassette.  B. Labeled: Ascending colon polyp cold biopsy x2, descending polyp cold snare x3, descending polyp cold biopsy x1 Received: In formalin Collection time: 9:57 AM on 08/17/2020 Placed into formalin time: 9:57 AM on 08/17/2020 Tissue fragment(s): Multiple Size: 2 x 0.7 x 0.2 cm Description: Aggregate of tan tissue fragments admixed with fecal matter Entirely submitted in one cassette.  BD 08/17/20   Final Diagnosis performed by Betsy Pries, MD.   Electronically signed 08/20/2020 8:54:05AM The electronic signature indicates that the named Attending Pathologist has evaluated the specimen Technical component performed at [facility], [address] [place], [place] [address] Lab: [phone] Dir: Rush Farmer, MD, MMM  Professional component performed at [facility], [facility], [place], [place], [place] [address] Lab: [phone] Dir: Dellia Nims. Rubinas, MD     Diabetic Foot Exam: Diabetic Foot Exam - Simple   No data filed    ***  PHQ2/9: Depression screen [facility] 2/9 06/20/2020 05/08/2020 01/05/2020 12/22/2019 11/14/2019  Decreased Interest 0 3 3 3  0  Down, Depressed, Hopeless 0 2 - 3 1  PHQ - 2 Score 0 5 3 6 1   Altered sleeping 0 0 3 3 1   Tired, decreased energy 3 3 3  - 1  Change in appetite 1 1 3  0 0  Feeling bad or failure about yourself  0 3 3 3  0  Trouble concentrating 0 0 1 3 0  Moving slowly or fidgety/restless 0 0 3 3 0  Suicidal thoughts 0 0 0 1 0  PHQ-9 Score 4 12 19 19 3   Difficult doing work/chores Not difficult at all - Extremely dIfficult Very difficult Not difficult at all  Some recent data might be hidden    phq 9 is {gen pos [id number] ***  Fall Risk: Fall Risk  05/08/2020 01/05/2020 12/27/2019 12/22/2019  11/14/2019  Falls in the past year? 1 0 0 0 0  Number falls  in past yr: 1 0 0 0 0  Injury with Fall? 1 0 0 0 0  Comment - - - - -  Risk for fall due to : - - - No Fall Risks -  Risk for fall due to: Comment - - - - -  Follow up - Falls evaluation completed Falls evaluation completed Falls prevention discussed -   ***   Functional Status Survey:   ***   Assessment & Plan  *** There are no diagnoses linked to this encounter.

## 2020-08-22 NOTE — Patient Instructions (Signed)
Preventive Care 64-64 Years Old, Female Preventive care refers to lifestyle choices and visits with your health care provider that can promote health and wellness. This includes:  A yearly physical exam. This is also called an annual wellness visit.  Regular dental and eye exams.  Immunizations.  Screening for certain conditions.  Healthy lifestyle choices, such as: ? Eating a healthy diet. ? Getting regular exercise. ? Not using drugs or products that contain nicotine and tobacco. ? Limiting alcohol use. What can I expect for my preventive care visit? Physical exam Your health care provider will check your:  Height and weight. These may be used to calculate your BMI (body mass index). BMI is a measurement that tells if you are at a healthy weight.  Heart rate and blood pressure.  Body temperature.  Skin for abnormal spots. Counseling Your health care provider may ask you questions about your:  Past medical problems.  Family's medical history.  Alcohol, tobacco, and drug use.  Emotional well-being.  Home life and relationship well-being.  Sexual activity.  Diet, exercise, and sleep habits.  Work and work Statistician.  Access to firearms.  Method of birth control.  Menstrual cycle.  Pregnancy history. What immunizations do I need? Vaccines are usually given at various ages, according to a schedule. Your health care provider will recommend vaccines for you based on your age, medical history, and lifestyle or other factors, such as travel or where you work.   What tests do I need? Blood tests  Lipid and cholesterol levels. These may be checked every 5 years, or more often if you are over 64 years old.  Hepatitis C test.  Hepatitis B test. Screening  Lung cancer screening. You may have this screening every year starting at age 64 if you have a 30-pack-year history of smoking and currently smoke or have quit within the past 15 years.  Colorectal cancer  screening. ? All adults should have this screening starting at age 64 and continuing until age 17. ? Your health care provider may recommend screening at age 64 if you are at increased risk. ? You will have tests every 1-10 years, depending on your results and the type of screening test.  Diabetes screening. ? This is done by checking your blood sugar (glucose) after you have not eaten for a while (fasting). ? You may have this done every 1-3 years.  Mammogram. ? This may be done every 1-2 years. ? Talk with your health care provider about when you should start having regular mammograms. This may depend on whether you have a family history of breast cancer.  BRCA-related cancer screening. This may be done if you have a family history of breast, ovarian, tubal, or peritoneal cancers.  Pelvic exam and Pap test. ? This may be done every 3 years starting at age 64. ? Starting at age 64, this may be done every 5 years if you have a Pap test in combination with an HPV test. Other tests  STD (sexually transmitted disease) testing, if you are at risk.  Bone density scan. This is done to screen for osteoporosis. You may have this scan if you are at high risk for osteoporosis. Talk with your health care provider about your test results, treatment options, and if necessary, the need for more tests. Follow these instructions at home: Eating and drinking  Eat a diet that includes fresh fruits and vegetables, whole grains, lean protein, and low-fat dairy products.  Take vitamin and mineral supplements  as recommended by your health care provider.  Do not drink alcohol if: ? Your health care provider tells you not to drink. ? You are pregnant, may be pregnant, or are planning to become pregnant.  If you drink alcohol: ? Limit how much you have to 0-1 drink a day. ? Be aware of how much alcohol is in your drink. In the [place] one drink equals one 12 oz bottle of beer (355 mL), one 5 oz glass of  wine (148 mL), or one 1 oz glass of hard liquor (44 mL).   Lifestyle  Take daily care of your teeth and gums. Brush your teeth every morning and night with fluoride toothpaste. Floss one time each day.  Stay active. Exercise for at least 30 minutes 5 or more days each week.  Do not use any products that contain nicotine or tobacco, such as cigarettes, e-cigarettes, and chewing tobacco. If you need help quitting, ask your health care provider.  Do not use drugs.  If you are sexually active, practice safe sex. Use a condom or other form of protection to prevent STIs (sexually transmitted infections).  If you do not wish to become pregnant, use a form of birth control. If you plan to become pregnant, see your health care provider for a prepregnancy visit.  If told by your health care provider, take low-dose aspirin daily starting at age 64.  Find healthy ways to cope with stress, such as: ? Meditation, yoga, or listening to music. ? Journaling. ? Talking to a trusted person. ? Spending time with friends and family. Safety  Always wear your seat belt while driving or riding in a vehicle.  Do not drive: ? If you have been drinking alcohol. Do not ride with someone who has been drinking. ? When you are tired or distracted. ? While texting.  Wear a helmet and other protective equipment during sports activities.  If you have firearms in your house, make sure you follow all gun safety procedures. What's next?  Visit your health care provider once a year for an annual wellness visit.  Ask your health care provider how often you should have your eyes and teeth checked.  Stay up to date on all vaccines. This information is not intended to replace advice given to you by your health care provider. Make sure you discuss any questions you have with your health care provider. Document Revised: 01/03/2020 Document Reviewed: 12/10/2017 Elsevier Patient Education  2021 [employer]  

## 2020-08-22 NOTE — Progress Notes (Signed)
Name: Linda Mejia   MRN: [id number]    DOB: 07-26-1955   Date:08/22/2020       Progress Note  Subjective  Chief Complaint  Annual Exam  HPI  Patient presents for annual CPE.  She was originally scheduled for hospital follow up from March, but it was changed to CPE since she is due and seeing GI. No SOB. She will return for regular follow up soon She does not want to have labs done today   Diet: lack of appetite, also has diarrhea and bowel urgency, seeing Dr. Bonna Gains  Exercise: discussed importance of physical activity   [place] Office Visit from 01/05/2020 in [facility]  AUDIT-C Score 1     Depression: Phq 9 is  Positive - frustrated because of diarrhea  Depression screen [facility] 2/9 08/22/2020 06/20/2020 05/08/2020 01/05/2020 12/22/2019  Decreased Interest 1 0 '3 3 3  ' Down, Depressed, Hopeless 1 0 2 - 3  PHQ - 2 Score 2 0 '5 3 6  ' Altered sleeping 0 0 0 3 3  Tired, decreased energy '3 3 3 3 ' -  Change in appetite '3 1 1 3 ' 0  Feeling bad or failure about yourself  0 0 '3 3 3  ' Trouble concentrating 0 0 0 1 3  Moving slowly or fidgety/restless 0 0 0 3 3  Suicidal thoughts 0 0 0 0 1  PHQ-9 Score '8 4 12 19 19  ' Difficult doing work/chores - Not difficult at all - Extremely dIfficult Very difficult  Some recent data might be hidden   Hypertension: BP Readings from Last 3 Encounters:  08/22/20 138/88  08/17/20 138/74  07/18/20 (!) 159/97   Obesity: Wt Readings from Last 3 Encounters:  08/22/20 207 lb (93.9 kg)  08/17/20 204 lb (92.5 kg)  07/18/20 213 lb 3.2 oz (96.7 kg)   BMI Readings from Last 3 Encounters:  08/22/20 34.45 kg/m  08/17/20 33.95 kg/m  07/18/20 35.48 kg/m     Vaccines:   Shingrix: 38-64 yo and ask insurance if covered when patient above 9 yo - rx sent to local pharmacy  Pneumonia: educated and discussed with patient. Flu: educated and discussed with patient.  Hep C Screening: 06/09/16 STD testing and prevention (HIV/chl/gon/syphilis):  06/23/20 Intimate partner violence: negative Sexual History : not sexually in 3 years  Menstrual History/LMP/Abnormal Bleeding: discussed post-menopausal bleeding  Incontinence Symptoms: denies problems   Breast cancer:  - Last Mammogram: Ordered 01/05/20 - BRCA gene screening: N/A  Osteoporosis: Discussed high calcium and vitamin D supplementation, weight bearing exercises  Cervical cancer screening: Ordered today  Skin cancer: Discussed monitoring for atypical lesions  Colorectal cancer: up to date  Lung cancer:  Low Dose CT Chest recommended if Age 72-80 years, 20 pack-year currently smoking OR have quit w/in 15years. Patient does not qualify.   ECG: 06/23/20  Advanced Care Planning: A voluntary discussion about advance care planning including the explanation and discussion of advance directives.  Discussed health care proxy and Living will, and the patient was able to identify a health care proxy as daughter - Ernie Hew   Lipids: Lab Results  Component Value Date   CHOL 332 (H) 12/06/2018   CHOL 194 05/29/2018   CHOL 190 03/09/2018   Lab Results  Component Value Date   HDL 58 12/06/2018   HDL 34 (L) 05/29/2018   HDL 54 03/09/2018   Lab Results  Component Value Date   LDLCALC 229 (H) 12/06/2018   LDLCALC 119 (H) 05/29/2018  LDLCALC 107 (H) 03/09/2018   Lab Results  Component Value Date   TRIG 250 (H) 12/06/2018   TRIG 203 (H) 05/29/2018   TRIG 172 (H) 03/09/2018   Lab Results  Component Value Date   CHOLHDL 5.7 (H) 12/06/2018   CHOLHDL 5.7 05/29/2018   CHOLHDL 3.5 03/09/2018   No results found for: LDLDIRECT  Glucose: Glucose  Date Value Ref Range Status  08/03/2014 102 (H) mg/dL Final    Comment:    65-99 NOTE: New Reference Range  06/20/14   07/28/2014 105 (H) mg/dL Final    Comment:    65-99 NOTE: New Reference Range  06/20/14   07/20/2014 109 (H) mg/dL Final    Comment:    65-99 NOTE: New Reference Range  06/20/14    Glucose, Bld  Date  Value Ref Range Status  06/26/2020 92 70 - 99 mg/dL Final    Comment:    Glucose reference range applies only to samples taken after fasting for at least 8 hours.  06/25/2020 95 70 - 99 mg/dL Final    Comment:    Glucose reference range applies only to samples taken after fasting for at least 8 hours.  06/24/2020 109 (H) 70 - 99 mg/dL Final    Comment:    Glucose reference range applies only to samples taken after fasting for at least 8 hours.   Glucose-Capillary  Date Value Ref Range Status  11/04/2019 106 (H) 70 - 99 mg/dL Final    Comment:    Glucose reference range applies only to samples taken after fasting for at least 8 hours.  06/01/2018 83 70 - 99 mg/dL Final  06/01/2018 91 70 - 99 mg/dL Final    Patient Active Problem List   Diagnosis Date Noted  . Screen for colon cancer   . Polyp of ascending colon   . Polyp of sigmoid colon   . Microscopic colitis   . Polyp of descending colon   . Intractable vomiting with nausea 06/23/2020  . Severe sepsis ([place]) 06/23/2020  . Essential (hemorrhagic) thrombocythemia ([employer]) 05/08/2020  . Osteoarthritis of multiple joints 01/05/2020  . Anxiety and depression 11/14/2019  . 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 ANA positive 09/22/2018  . 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preiser), left ([place]) 09/22/2018  . Tenosynovitis of hand 09/22/2018  . Stomach irritation   . Pulmonary hypertension ([place]) 03/09/2018  . Unstable angina ([place]) 01/23/2018  . Coronary artery calcification seen on CT scan 12/02/2016  . Aortic atherosclerosis ([place]) 12/02/2016  . Abnormal ankle brachial index (ABI) 12/02/2016  . Prediabetes 12/02/2016  . Degenerative arthritis of knee, bilateral 12/27/2015  . Class 2 severe obesity due to excess calories with serious comorbidity and body mass index (BMI) of 36.0 to 36.9 in adult ([place]) 12/27/2015  . Reflux esophagitis   . Polyarthralgia 06/05/2015  . Subclinical hypothyroidism 03/26/2015  .  Herpes simplex type 2 infection 03/21/2015  . Rhinitis, allergic 03/21/2015  . ADHD (attention deficit hyperactivity disorder), inattentive type 09/20/2014  . Episodic paroxysmal anxiety disorder 09/20/2014  . History of gastric ulcer 09/20/2014  . Substance abuse ([place]) 09/20/2014  . HLD (hyperlipidemia) 10/03/2009  . Coronary artery disease involving native coronary artery of native heart with angina pectoris ([place]) 10/02/2009  . Hypertension goal BP (blood pressure) < 140/90 10/02/2009    Past Surgical History:  Procedure Laterality Date  . APPENDECTOMY    . CARDIAC CATHETERIZATION    . CESAREAN SECTION  x2  . COLON SURGERY     interception  . COLONOSCOPY WITH PROPOFOL N/A 08/17/2020   Procedure: COLONOSCOPY WITH PROPOFOL;  Surgeon: Virgel Manifold, MD;  Location: [facility] ENDOSCOPY;  Service: Endoscopy;  Laterality: N/A;  . CORONARY ANGIOPLASTY     stents...  2000  . ESOPHAGOGASTRODUODENOSCOPY (EGD) WITH PROPOFOL N/A 09/21/2015   Procedure: ESOPHAGOGASTRODUODENOSCOPY (EGD) WITH PROPOFOL;  Surgeon: Lucilla Lame, MD;  Location: [facility] ENDOSCOPY;  Service: Endoscopy;  Laterality: N/A;  . ESOPHAGOGASTRODUODENOSCOPY (EGD) WITH PROPOFOL N/A 07/30/2016   Procedure: ESOPHAGOGASTRODUODENOSCOPY (EGD) WITH PROPOFOL;  Surgeon: Jonathon Bellows, MD;  Location: [facility] ENDOSCOPY;  Service: Endoscopy;  Laterality: N/A;  . ESOPHAGOGASTRODUODENOSCOPY (EGD) WITH PROPOFOL N/A 04/23/2018   Procedure: ESOPHAGOGASTRODUODENOSCOPY (EGD) WITH PROPOFOL;  Surgeon: Virgel Manifold, MD;  Location: [facility] ENDOSCOPY;  Service: Endoscopy;  Laterality: N/A;  . ESOPHAGOGASTRODUODENOSCOPY (EGD) WITH PROPOFOL N/A 11/04/2019   Procedure: ESOPHAGOGASTRODUODENOSCOPY (EGD) WITH PROPOFOL;  Surgeon: Lin Landsman, MD;  Location: [facility] ENDOSCOPY;  Service: Gastroenterology;  Laterality: N/A;  . EYE SURGERY  Dec 2021   Droopy eye lids  . LEFT HEART CATH Right 01/25/2018   Procedure: Left Heart Cath and Coronary Angiography;   Surgeon: Dionisio David, MD;  Location: [place] [facility];  Service: Cardiovascular;  Laterality: Right;  . LEFT HEART CATH AND CORONARY ANGIOGRAPHY Right 08/12/2016   Procedure: Left Heart Cath and Coronary Angiography;  Surgeon: Dionisio David, MD;  Location: [place] [facility];  Service: Cardiovascular;  Laterality: Right;  . SMALL INTESTINE SURGERY    . TONSILLECTOMY    . TUBAL LIGATION      Family History  Problem Relation Age of Onset  . Diabetes Mother   . CVA Mother   . Heart disease Father   . Heart disease Sister   . Breast cancer Sister   . Heart disease Brother   . Heart disease Sister   . Heart disease Sister   . Heart disease Sister   . Heart disease Brother   . Heart disease Brother   . Heart disease Brother   . Heart disease Brother   . Healthy Daughter   . Healthy Daughter     Social History   Socioeconomic History  . Marital status: Divorced    Spouse name: Not on file  . Number of children: 2  . Years of education: Not on file  . Highest education level: GED or equivalent  Occupational History  . Occupation: disabled  Tobacco Use  . Smoking status: Former Smoker    Packs/day: 1.00    Years: 30.00    Pack years: 30.00    Types: Cigarettes    Quit date: 2006    Years since quitting: 16.3  . Smokeless tobacco: Never Used  . Tobacco comment: I quit smoking cigarettes  Vaping Use  . Vaping Use: Never used  Substance and Sexual Activity  . Alcohol use: Yes    Alcohol/week: 1.0 standard drink    Types: 1 Glasses of wine per week    Comment: occasionally   . Drug use: No  . Sexual activity: Not Currently    Birth control/protection: Post-menopausal, None  Other Topics Concern  . Not on file  Social History Narrative  . Not on file   Social Determinants of Health   Financial Resource Strain: Low Risk   . Difficulty of Paying Living Expenses: Not very hard  Food Insecurity: No Food Insecurity  . Worried About Charity fundraiser in the  Last Year:  Never true  . Ran Out of Food in the Last Year: Never true  Transportation Needs: No Transportation Needs  . Lack of Transportation (Medical): No  . Lack of Transportation (Non-Medical): No  Physical Activity: Inactive  . Days of Exercise per Week: 0 days  . Minutes of Exercise per Session: 0 min  Stress: No Stress Concern Present  . Feeling of Stress : Only a little  Social Connections: Moderately Isolated  . Frequency of Communication with Friends and Family: More than three times a week  . Frequency of Social Gatherings with Friends and Family: Once a week  . Attends Religious Services: More than 4 times per year  . Active Member of Clubs or Organizations: No  . Attends Archivist Meetings: Never  . Marital Status: Widowed  Intimate Partner Violence: Not At Risk  . Fear of Current or Ex-Partner: No  . Emotionally Abused: No  . Physically Abused: No  . Sexually Abused: No     Current Outpatient Medications:  .  acyclovir (ZOVIRAX) 400 MG tablet, Take 1 tablet (400 mg total) by mouth 2 (two) times daily., Disp: 60 tablet, Rfl: 5 .  amphetamine-dextroamphetamine (ADDERALL) 30 MG tablet, Take 30 mg by mouth 2 (two) times daily., Disp: , Rfl:  .  aspirin EC 81 MG tablet, Take 81 mg by mouth daily., Disp: , Rfl:  .  diazepam (VALIUM) 10 MG tablet, Take 10 mg by mouth 3 (three) times daily as needed., Disp: , Rfl:  .  fluticasone (FLONASE) 50 MCG/ACT nasal spray, Place 2 sprays into both nostrils daily as needed for allergies., Disp: 16 g, Rfl: 11 .  metoprolol tartrate (LOPRESSOR) 50 MG tablet, Take 1 tablet (50 mg total) by mouth 2 (two) times daily., Disp: 60 tablet, Rfl: 5 .  nystatin (MYCOSTATIN/NYSTOP) powder, Apply topically 2 (two) times daily., Disp: 60 g, Rfl: 0 .  pantoprazole (PROTONIX) 40 MG tablet, Take 1 tablet (40 mg total) by mouth daily., Disp: 30 tablet, Rfl: 0  Allergies  Allergen Reactions  . Amlodipine     Leg swelling  . Statins Other  (See Comments)     ROS  Constitutional: Negative for fever or weight change.  Respiratory: Negative for cough and shortness of breath.   Cardiovascular: Negative for chest pain or palpitations.  Gastrointestinal: Negative for abdominal pain, but has  bowel changes.  Musculoskeletal: Negative for gait problem or joint swelling.  Skin: Negative for rash.  Neurological: Negative for dizziness or headache.  No other specific complaints in a complete review of systems (except as listed in HPI above).  Objective  Vitals:   08/22/20 1059  BP: 138/88  Pulse: 70  Resp: 16  Temp: 97.8 F (36.6 C)  TempSrc: Oral  SpO2: 96%  Weight: 207 lb (93.9 kg)  Height: '5\' 5"'  (1.651 m)    Body mass index is 34.45 kg/m.  Physical Exam  Constitutional: Patient appears well-developed and well-nourished. No distress.  HENT: Head: Normocephalic and atraumatic. Ears: B TMs ok, no erythema or effusion some wax on right ear canal ; Nose: Not done . Mouth/Throat: not done  Eyes: Conjunctivae and EOM are normal. Pupils are equal, round, and reactive to light. No scleral icterus.  Neck: Normal range of motion. Neck supple. No JVD present. No thyromegaly present.  Cardiovascular: Normal rate, regular rhythm and normal heart sounds.  No murmur heard. No BLE edema. Pulmonary/Chest: Effort normal and breath sounds normal. No respiratory distress. Abdominal: Soft. Bowel sounds are normal,  no distension. There is no tenderness. no masses Breast: no lumps or masses, no nipple discharge or rashes FEMALE GENITALIA:  External genitalia normal External urethra normal Vaginal vault normal without discharge or lesions Cervix normal without discharge or lesions Bimanual exam normal without masses RECTAL: not done  Musculoskeletal: Normal range of motion, no joint effusions. No gross deformities Neurological: he is alert and oriented to person, place, and time. No cranial nerve deficit. Coordination, balance,  strength, speech and gait are normal.  Skin: Skin is warm and dry. No rash noted. Senile purpura on left arm and left hand  Psychiatric: Patient has a normal mood and affect. behavior is normal. Judgment and thought content normal.    Fall Risk: Fall Risk  08/22/2020 05/08/2020 01/05/2020 12/27/2019 12/22/2019  Falls in the past year? 1 1 0 0 0  Number falls in past yr: 1 1 0 0 0  Injury with Fall? 0 1 0 0 0  Comment - - - - -  Risk for fall due to : - - - - No Fall Risks  Risk for fall due to: Comment - - - - -  Follow up - - Falls evaluation completed Falls evaluation completed Falls prevention discussed     Functional Status Survey: Is the patient deaf or have difficulty hearing?: No Does the patient have difficulty seeing, even when wearing glasses/contacts?: No Does the patient have difficulty concentrating, remembering, or making decisions?: No Does the patient have difficulty walking or climbing stairs?: No Does the patient have difficulty dressing or bathing?: No Does the patient have difficulty doing errands alone such as visiting a doctor's office or shopping?: No   Assessment & Plan  1. Well adult exam   2. Cervical cancer screening  - Cytology - PAP  3. Breast cancer screening by mammogram  - MM 3D SCREEN BREAST BILATERAL; Future  4. Senile purpura ([place])  Reassurance given   5. Need for shingles vaccine  - Zoster Vaccine Adjuvanted [facility]) injection; Inject 0.5 mLs into the muscle once for 1 dose.  Dispense: 0.5 mL; Refill: 1  -USPSTF grade A and B recommendations reviewed with patient; age-appropriate recommendations, preventive care, screening tests, etc discussed and encouraged; healthy living encouraged; see AVS for patient education given to patient -Discussed importance of 150 minutes of physical activity weekly, eat two servings of fish weekly, eat one serving of tree nuts ( cashews, pistachios, pecans, almonds.Marland Kitchen) every other day, eat 6 servings of  fruit/vegetables daily and drink plenty of water and avoid sweet beverages.

## 2020-08-23 LAB — CYTOLOGY - PAP
Comment: NEGATIVE
Diagnosis: NEGATIVE
High risk HPV: NEGATIVE

## 2020-08-29 ENCOUNTER — Ambulatory Visit (INDEPENDENT_AMBULATORY_CARE_PROVIDER_SITE_OTHER): Payer: Medicare Other | Admitting: Podiatry

## 2020-08-29 ENCOUNTER — Encounter: Payer: Self-pay | Admitting: Podiatry

## 2020-08-29 ENCOUNTER — Ambulatory Visit (INDEPENDENT_AMBULATORY_CARE_PROVIDER_SITE_OTHER): Payer: Medicare Other

## 2020-08-29 ENCOUNTER — Other Ambulatory Visit: Payer: Self-pay

## 2020-08-29 DIAGNOSIS — M722 Plantar fascial fibromatosis: Secondary | ICD-10-CM | POA: Diagnosis not present

## 2020-08-29 MED ORDER — MELOXICAM 15 MG PO TABS: 15.0000 mg | ORAL_TABLET | Freq: Every day | ORAL | 3 refills | Status: DC

## 2020-08-29 MED ORDER — METHYLPREDNISOLONE 4 MG PO TBPK: ORAL_TABLET | ORAL | 0 refills | Status: DC

## 2020-08-29 MED ORDER — TRIAMCINOLONE ACETONIDE 40 MG/ML IJ SUSP
20.0000 mg | Freq: Once | INTRAMUSCULAR | Status: AC
  Administered 2020-08-29: 20 mg

## 2020-08-29 NOTE — Progress Notes (Signed)
Subjective:  Patient ID: Linda Mejia, female    DOB: 06/12/1955,  MRN: [id number] HPI Chief Complaint  Patient presents with  . Foot Pain    Plantar heel left - aching, numbness x 4 months, no treatment, treated for PF in right foot years ago  . New Patient (Initial Visit)    65 y.o. female presents with the above complaint.   ROS: Denies fever chills nausea vomiting muscle aches pains calf pain back pain chest pain shortness of breath.  Past Medical History:  Diagnosis Date  . Abnormal ankle brachial index (ABI) 12/02/2016  . ADHD (attention deficit hyperactivity disorder)   . AKI (acute kidney injury) ([place])   . Allergy 2020   Can't take amlopine. Causes swelling  . Anxiety   . Aortic atherosclerosis ([place]) 12/02/2016   July 2018  . Arthritis   . Chronic back pain   . Coronary artery disease   . Depression 2020   I think everyone is depressed at this unprecedented time!  . Encounter for HIV (human immunodeficiency virus) test   . Essential (hemorrhagic) thrombocythemia ([place]) 05/08/2020  . GERD (gastroesophageal reflux disease)   . Herpes genitalis in women   . History of stomach ulcers    x7 this year. Bleeding  . HLD (hyperlipidemia)   . HYPERLIPIDEMIA-MIXED   . Hypertension   . Prediabetes 12/02/2016  . Pulmonary hypertension ([place]) 03/09/2018   Echo Oct 2019  . 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preiser), left ([place])   . Subclinical hypothyroidism   . Subclinical hypothyroidism   . Ulcer    Past Surgical History:  Procedure Laterality Date  . APPENDECTOMY    . CARDIAC CATHETERIZATION    . CESAREAN SECTION     x2  . COLON SURGERY     interception  . COLONOSCOPY WITH PROPOFOL N/A 08/17/2020   Procedure: COLONOSCOPY WITH PROPOFOL;  Surgeon: Virgel Manifold, MD;  Location: [facility] ENDOSCOPY;  Service: Endoscopy;  Laterality: N/A;  . CORONARY ANGIOPLASTY     stents...  2000  . ESOPHAGOGASTRODUODENOSCOPY (EGD) WITH PROPOFOL N/A 09/21/2015   Procedure: ESOPHAGOGASTRODUODENOSCOPY  (EGD) WITH PROPOFOL;  Surgeon: Lucilla Lame, MD;  Location: [facility] ENDOSCOPY;  Service: Endoscopy;  Laterality: N/A;  . ESOPHAGOGASTRODUODENOSCOPY (EGD) WITH PROPOFOL N/A 07/30/2016   Procedure: ESOPHAGOGASTRODUODENOSCOPY (EGD) WITH PROPOFOL;  Surgeon: Jonathon Bellows, MD;  Location: [facility] ENDOSCOPY;  Service: Endoscopy;  Laterality: N/A;  . ESOPHAGOGASTRODUODENOSCOPY (EGD) WITH PROPOFOL N/A 04/23/2018   Procedure: ESOPHAGOGASTRODUODENOSCOPY (EGD) WITH PROPOFOL;  Surgeon: Virgel Manifold, MD;  Location: [facility] ENDOSCOPY;  Service: Endoscopy;  Laterality: N/A;  . ESOPHAGOGASTRODUODENOSCOPY (EGD) WITH PROPOFOL N/A 11/04/2019   Procedure: ESOPHAGOGASTRODUODENOSCOPY (EGD) WITH PROPOFOL;  Surgeon: Lin Landsman, MD;  Location: [facility] ENDOSCOPY;  Service: Gastroenterology;  Laterality: N/A;  . EYE SURGERY  Dec 2021   Droopy eye lids  . LEFT HEART CATH Right 01/25/2018   Procedure: Left Heart Cath and Coronary Angiography;  Surgeon: Dionisio David, MD;  Location: [place] [facility];  Service: Cardiovascular;  Laterality: Right;  . LEFT HEART CATH AND CORONARY ANGIOGRAPHY Right 08/12/2016   Procedure: Left Heart Cath and Coronary Angiography;  Surgeon: Dionisio David, MD;  Location: [place] [facility];  Service: Cardiovascular;  Laterality: Right;  . SMALL INTESTINE SURGERY    . TONSILLECTOMY    . TUBAL LIGATION      Current Outpatient Medications:  .  meloxicam (MOBIC) 15 MG tablet, Take 1 tablet (15 mg total) by mouth daily., Disp: 30 tablet, Rfl: 3 .  methylPREDNISolone (MEDROL DOSEPAK) 4 MG TBPK tablet, 6 day dose pack - take as directed, Disp: 21 tablet, Rfl: 0 .  acyclovir (ZOVIRAX) 400 MG tablet, Take 1 tablet (400 mg total) by mouth 2 (two) times daily., Disp: 60 tablet, Rfl: 5 .  amphetamine-dextroamphetamine (ADDERALL) 30 MG tablet, Take 30 mg by mouth 2 (two) times daily., Disp: , Rfl:  .  aspirin EC 81 MG tablet, Take 81 mg by mouth daily., Disp: , Rfl:  .  diazepam (VALIUM) 10 MG tablet,  Take 10 mg by mouth 3 (three) times daily as needed., Disp: , Rfl:  .  fluticasone (FLONASE) 50 MCG/ACT nasal spray, Place 2 sprays into both nostrils daily as needed for allergies., Disp: 16 g, Rfl: 11 .  metoprolol tartrate (LOPRESSOR) 50 MG tablet, Take 1 tablet (50 mg total) by mouth 2 (two) times daily., Disp: 60 tablet, Rfl: 5 .  nystatin (MYCOSTATIN/NYSTOP) powder, Apply topically 2 (two) times daily., Disp: 60 g, Rfl: 0 .  pantoprazole (PROTONIX) 40 MG tablet, Take 1 tablet (40 mg total) by mouth daily., Disp: 30 tablet, Rfl: 0  Allergies  Allergen Reactions  . Amlodipine     Leg swelling  . Statins Other (See Comments)   Review of Systems Objective:  There were no vitals filed for this visit.  General: Well developed, nourished, in no acute distress, alert and oriented x3   Dermatological: Skin is warm, dry and supple bilateral. Nails x 10 are well maintained; remaining integument appears unremarkable at this time. There are no open sores, no preulcerative lesions, no rash or signs of infection present.  Vascular: Dorsalis Pedis artery and Posterior Tibial artery pedal pulses are 2/4 bilateral with immedate capillary fill time. Pedal hair growth present. No varicosities and no lower extremity edema present bilateral.   Neruologic: Grossly intact via light touch bilateral. Vibratory intact via tuning fork bilateral. Protective threshold with Semmes Wienstein monofilament intact to all pedal sites bilateral. Patellar and Achilles deep tendon reflexes 2+ bilateral. No Babinski or clonus noted bilateral.   Musculoskeletal: No gross boney pedal deformities bilateral. No pain, crepitus, or limitation noted with foot and ankle range of motion bilateral. Muscular strength 5/5 in all groups tested bilateral.  Pain on palpation mid calcaneal tubercle of the left heel.  Gait: Unassisted, Nonantalgic.    Radiographs:  Radiographs taken today demonstrate osseously mature individual soft  tissue increase in density plantar fascial kidney insertion site no fractures of the calcaneus.  No acute findings.  Assessment & Plan:   Assessment: Planter fasciitis left.  Plan: I injected the area today with Kenalog and local anesthetic started her on oral medications consisting of methylprednisolone to be followed by meloxicam.  Dispensed a plantar fascial brace discussed appropriate shoe gear stretching exercises and ice therapy.  I will follow-up with her in 1 month.  She would like to consider surgery to her left foot like she had on her right.     Linda Mejia, [place]

## 2020-08-29 NOTE — Patient Instructions (Signed)

## 2020-09-18 ENCOUNTER — Other Ambulatory Visit: Payer: Self-pay

## 2020-09-18 DIAGNOSIS — M25461 Effusion, right knee: Secondary | ICD-10-CM

## 2020-09-18 DIAGNOSIS — M159 Polyosteoarthritis, unspecified: Secondary | ICD-10-CM

## 2020-09-18 MED ORDER — PANTOPRAZOLE SODIUM 40 MG PO TBEC: 40.0000 mg | DELAYED_RELEASE_TABLET | Freq: Every day | ORAL | 0 refills | Status: DC

## 2020-09-18 NOTE — Telephone Encounter (Signed)
Looks like this was last filled by you 10/2019 and 01/2020...Marland Kitchen please advise if okay for pt to continue medication

## 2020-09-20 ENCOUNTER — Other Ambulatory Visit: Payer: Self-pay

## 2020-09-20 ENCOUNTER — Ambulatory Visit
Admission: RE | Admit: 2020-09-20 | Discharge: 2020-09-20 | Disposition: A | Discharge status: Home / Self Care | Payer: Medicare Other | Source: Ambulatory Visit | Attending: Family Medicine | Admitting: Family Medicine

## 2020-09-20 DIAGNOSIS — Z1231 Encounter for screening mammogram for malignant neoplasm of breast: Secondary | ICD-10-CM | POA: Diagnosis not present

## 2020-10-03 ENCOUNTER — Other Ambulatory Visit: Payer: Self-pay

## 2020-10-03 ENCOUNTER — Encounter: Payer: Self-pay | Admitting: Podiatry

## 2020-10-03 ENCOUNTER — Ambulatory Visit (INDEPENDENT_AMBULATORY_CARE_PROVIDER_SITE_OTHER): Payer: Medicare Other | Admitting: Podiatry

## 2020-10-03 DIAGNOSIS — M722 Plantar fascial fibromatosis: Secondary | ICD-10-CM | POA: Diagnosis not present

## 2020-10-03 NOTE — Progress Notes (Signed)
She presents today for follow-up of her left heel she states that she would like to just go ahead and have the surgery done to the left one just like she did on the right 1.  States that the foot is not any better after the injection.  Objective: Vital signs are stable is alert orient x3 I reviewed her past medical history medications allergies surgery social history and review of systems.  Pain on palpation medial calcaneal tubercle of the left heel.  Medial band Planter fasciitis.  Assessment: Medial band plantar fasciitis.  Plan: Consent her today for medial band endoscopic plantar fasciotomy.  Discussed appropriate possible postop complications which may include but not limited to postop pain bleeding swelling infection recurrence need further surgery overcorrection under correction also digit loss of limb loss of life continue pain.  I will follow-up with her in the near future for surgical intervention.

## 2020-10-05 ENCOUNTER — Telehealth: Payer: Self-pay

## 2020-10-05 NOTE — Telephone Encounter (Signed)
Pt caled and given an appt for 10-08-2020

## 2020-10-05 NOTE — Telephone Encounter (Signed)
Copied from [place] [id number]. Topic: General - Other >> Oct 05, 2020  9:58 AM Antonieta Iba C wrote: Reason for CRM: pt called in to schedule an apt. Pt says that she is experiencing vaginal itching, frequent urination. Pt says that she was also suppose to have lab work at her last visit with provider. Pt would like to know if a provider is able to see her sooner than next available?      CB: [phone] phone

## 2020-10-08 ENCOUNTER — Telehealth: Payer: Medicare Other | Admitting: Family Medicine

## 2020-10-17 ENCOUNTER — Telehealth: Payer: Self-pay

## 2020-10-17 NOTE — Telephone Encounter (Signed)
Linda Mejia called to reschedule her surgery with Dr. Milinda Pointer from 11/09/2020 to 11/23/2020. She stated her daughter didn't have the time off on 11/09/2020 and needed to reschedule. I notified Dr. Milinda Pointer and [name] with [place]

## 2020-10-18 ENCOUNTER — Other Ambulatory Visit: Payer: Self-pay | Admitting: Family Medicine

## 2020-10-18 DIAGNOSIS — L304 Erythema intertrigo: Secondary | ICD-10-CM

## 2020-10-18 NOTE — Telephone Encounter (Signed)
Requested medication (s) are due for refill today: yes   Requested medication (s) are on the active medication list: yes   Last refill:  05/08/2020  Future visit scheduled; yes  Notes to clinic:  this refill cannot be delegated    Requested Prescriptions  Pending Prescriptions Disp Refills   NYSTATIN powder [Pharmacy Med Name: NYSTOP TOP PWDR 100,000 60GM] 60 g 0    Sig: APPLY TO THE AFFECTED AREA ON SKIN TWICE DAILY      Off-Protocol Failed - 10/18/2020 12:01 PM      Failed - Medication not assigned to a protocol, review manually.      Passed - Valid encounter within last 12 months    Recent Outpatient Visits           1 month ago Well adult exam   [facility] Steele Sizer, MD   4 months ago Viral URI   [place], DO   5 months ago Osteoarthritis of multiple joints, unspecified osteoarthritis type   [facility] Steele Sizer, MD   9 months ago Encounter for screening mammogram for malignant neoplasm of breast   [facility] Towanda Malkin, MD   11 months ago Encounter for examination following treatment at hospital   [place], MD       Future Appointments             In 2 weeks Steele Sizer, MD [facility], [place]   In 2 months  [facility], [facility]

## 2020-10-18 NOTE — Telephone Encounter (Signed)
Last appt 10-08-2020 next is 11-01-2020

## 2020-10-20 ENCOUNTER — Emergency Department
Admission: EM | Admit: 2020-10-20 | Discharge: 2020-10-20 | Disposition: A | Discharge status: Home / Self Care | Payer: Medicare Other | Attending: Emergency Medicine | Admitting: Emergency Medicine

## 2020-10-20 ENCOUNTER — Other Ambulatory Visit: Payer: Self-pay

## 2020-10-20 DIAGNOSIS — Z7982 Long term (current) use of aspirin: Secondary | ICD-10-CM | POA: Diagnosis not present

## 2020-10-20 DIAGNOSIS — R309 Painful micturition, unspecified: Secondary | ICD-10-CM | POA: Diagnosis not present

## 2020-10-20 DIAGNOSIS — R1030 Lower abdominal pain, unspecified: Secondary | ICD-10-CM | POA: Insufficient documentation

## 2020-10-20 DIAGNOSIS — R102 Pelvic and perineal pain: Secondary | ICD-10-CM | POA: Insufficient documentation

## 2020-10-20 DIAGNOSIS — M545 Low back pain, unspecified: Secondary | ICD-10-CM | POA: Insufficient documentation

## 2020-10-20 DIAGNOSIS — Z87891 Personal history of nicotine dependence: Secondary | ICD-10-CM | POA: Insufficient documentation

## 2020-10-20 DIAGNOSIS — Z79899 Other long term (current) drug therapy: Secondary | ICD-10-CM | POA: Diagnosis not present

## 2020-10-20 DIAGNOSIS — I25119 Atherosclerotic heart disease of native coronary artery with unspecified angina pectoris: Secondary | ICD-10-CM | POA: Diagnosis not present

## 2020-10-20 DIAGNOSIS — E039 Hypothyroidism, unspecified: Secondary | ICD-10-CM | POA: Diagnosis not present

## 2020-10-20 DIAGNOSIS — I10 Essential (primary) hypertension: Secondary | ICD-10-CM | POA: Insufficient documentation

## 2020-10-20 DIAGNOSIS — N949 Unspecified condition associated with female genital organs and menstrual cycle: Secondary | ICD-10-CM

## 2020-10-20 LAB — WET PREP, GENITAL
Clue Cells Wet Prep HPF POC: NONE SEEN
Sperm: NONE SEEN
Trich, Wet Prep: NONE SEEN
Yeast Wet Prep HPF POC: NONE SEEN

## 2020-10-20 LAB — CBC
HCT: 42.9 % (ref 36.0–46.0)
Hemoglobin: 14 g/dL (ref 12.0–15.0)
MCH: 31 pg (ref 26.0–34.0)
MCHC: 32.6 g/dL (ref 30.0–36.0)
MCV: 95.1 fL (ref 80.0–100.0)
Platelets: 323 10*3/uL (ref 150–400)
RBC: 4.51 MIL/uL (ref 3.87–5.11)
RDW: 13.6 % (ref 11.5–15.5)
WBC: 7.2 10*3/uL (ref 4.0–10.5)
nRBC: 0 % (ref 0.0–0.2)

## 2020-10-20 LAB — COMPREHENSIVE METABOLIC PANEL
ALT: 14 U/L (ref 0–44)
AST: 23 U/L (ref 15–41)
Albumin: 4.1 g/dL (ref 3.5–5.0)
Alkaline Phosphatase: 66 U/L (ref 38–126)
Anion gap: 12 (ref 5–15)
BUN: 17 mg/dL (ref 8–23)
CO2: 24 mmol/L (ref 22–32)
Calcium: 8.4 mg/dL — ABNORMAL LOW (ref 8.9–10.3)
Chloride: 98 mmol/L (ref 98–111)
Creatinine, Ser: 0.65 mg/dL (ref 0.44–1.00)
GFR, Estimated: >60 mL/min (ref >60)
Glucose, Bld: 97 mg/dL (ref 70–99)
Potassium: 4.5 mmol/L (ref 3.5–5.1)
Sodium: 134 mmol/L — ABNORMAL LOW (ref 135–145)
Total Bilirubin: 0.6 mg/dL (ref 0.3–1.2)
Total Protein: 7.5 g/dL (ref 6.5–8.1)

## 2020-10-20 LAB — URINALYSIS, COMPLETE (UACMP) WITH MICROSCOPIC
Bacteria, UA: NONE SEEN
Bilirubin Urine: NEGATIVE
Glucose, UA: NEGATIVE mg/dL
Hgb urine dipstick: NEGATIVE
Ketones, ur: NEGATIVE mg/dL
Leukocytes,Ua: NEGATIVE
Nitrite: NEGATIVE
Protein, ur: NEGATIVE mg/dL
Specific Gravity, Urine: 1.013 (ref 1.005–1.030)
pH: 7 (ref 5.0–8.0)

## 2020-10-20 NOTE — ED Triage Notes (Addendum)
Pt states noticed a "BALLOON" like structure hanging out of vagina. Pt states since may she has had vaginal pain. Pt states her skin is "blotchy". Pt states she is having lower pelvic pain and lower back pain. Pt also states she has been having frequent urination and complains of burning and itching to her vaginal lips.

## 2020-10-20 NOTE — ED Notes (Signed)
Pt reports severe right groin/pelvic pain 6/10 with vaginal itching, redness of vulva. Used monistat OTC with no relief of symptoms and yesterday noticed a "balloon structure" hanging from her vagina which is not present today per pt report. Provider at bedside.

## 2020-10-20 NOTE — ED Provider Notes (Signed)
[facility] Emergency Department Provider Note   ____________________________________________   I have reviewed the triage vital signs and the nursing notes.   HISTORY  Chief Complaint Vaginal Pain   History limited by: Not Limited   HPI Linda Mejia is a 65 y.o. female who presents to the emergency department today because of concern for vaginal pain and bulging. She states that her symptoms started back in May after a pap smear. She says that she started at first noticing some itchiness to that area. She then started developing low back and low abdominal discomfort. She has noticed some difficulty with urination. Over the past couple of days she states she has felt like there is a "balloon" down there. The patient states she has not been sexually active in over 3 years.    Records reviewed. Per medical record review patient has a history of CAD, AKI.  Past Medical History:  Diagnosis Date   Abnormal ankle brachial index (ABI) 12/02/2016   ADHD (attention deficit hyperactivity disorder)    AKI (acute kidney injury) ([place])    Allergy 2020   Can't take amlopine. Causes swelling   Anxiety    Aortic atherosclerosis ([place]) 12/02/2016   July 2018   Arthritis    Chronic back pain    Coronary artery disease    Depression 2020   I think everyone is depressed at this unprecedented time!   Encounter for HIV (human immunodeficiency virus) test    Essential (hemorrhagic) thrombocythemia ([place]) 05/08/2020   GERD (gastroesophageal reflux disease)    Herpes genitalis in women    History of stomach ulcers    x7 this year. 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[place]), left [facility])    Subclinical hypothyroidism    Subclinical hypothyroidism    Ulcer     Patient Active Problem List   Diagnosis Date Noted   Screen for  colon cancer    Polyp of ascending colon    Polyp of sigmoid colon    Microscopic colitis    Polyp of descending colon    Intractable vomiting with nausea 06/23/2020   Severe sepsis ([place]) 06/23/2020   Essential (hemorrhagic) thrombocythemia ([place]) 05/08/2020   Osteoarthritis of multiple joints 01/05/2020   Anxiety and depression 11/14/2019   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 12/16/2018   ANA positive 09/22/2018   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[place]), 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   Tenosynovitis of hand 09/22/2018   Stomach irritation    Pulmonary hypertension ([place]) 03/09/2018   Unstable angina ([place]) 01/23/2018   Coronary artery calcification seen on CT scan 12/02/2016   Aortic atherosclerosis ([place]) 12/02/2016   Abnormal ankle brachial index (ABI) 12/02/2016   Prediabetes 12/02/2016   Degenerative arthritis of knee, bilateral 12/27/2015   Class 2 severe obesity due to excess calories with serious comorbidity and body mass index (BMI) of 36.0 to 36.9 in adult ([place]) 12/27/2015   Reflux esophagitis    Polyarthralgia 06/05/2015   Subclinical hypothyroidism 03/26/2015   Herpes simplex type 2 infection 03/21/2015   Rhinitis, allergic 03/21/2015   ADHD (attention deficit hyperactivity disorder), inattentive type 09/20/2014   Episodic paroxysmal anxiety disorder 09/20/2014   History of gastric ulcer 09/20/2014   Substance abuse ([place]) 09/20/2014   HLD (hyperlipidemia) 10/03/2009   Coronary artery disease involving native coronary artery of  native heart with angina pectoris ([place]) 10/02/2009   Hypertension goal BP (blood pressure) < 140/90 10/02/2009    Past Surgical History:  Procedure Laterality Date   APPENDECTOMY     CARDIAC CATHETERIZATION     CESAREAN SECTION     x2   COLON SURGERY     interception   COLONOSCOPY WITH PROPOFOL N/A 08/17/2020   Procedure: COLONOSCOPY WITH PROPOFOL;  Surgeon: Virgel Manifold, MD;  Location: [facility] ENDOSCOPY;  Service: Endoscopy;   Laterality: N/A;   CORONARY ANGIOPLASTY     stents...  2000   ESOPHAGOGASTRODUODENOSCOPY (EGD) WITH PROPOFOL N/A 09/21/2015   Procedure: ESOPHAGOGASTRODUODENOSCOPY (EGD) WITH PROPOFOL;  Surgeon: Lucilla Lame, MD;  Location: [facility] ENDOSCOPY;  Service: Endoscopy;  Laterality: N/A;   ESOPHAGOGASTRODUODENOSCOPY (EGD) WITH PROPOFOL N/A 07/30/2016   Procedure: ESOPHAGOGASTRODUODENOSCOPY (EGD) WITH PROPOFOL;  Surgeon: Jonathon Bellows, MD;  Location: [facility] ENDOSCOPY;  Service: Endoscopy;  Laterality: N/A;   ESOPHAGOGASTRODUODENOSCOPY (EGD) WITH PROPOFOL N/A 04/23/2018   Procedure: ESOPHAGOGASTRODUODENOSCOPY (EGD) WITH PROPOFOL;  Surgeon: Virgel Manifold, MD;  Location: [facility] ENDOSCOPY;  Service: Endoscopy;  Laterality: N/A;   ESOPHAGOGASTRODUODENOSCOPY (EGD) WITH PROPOFOL N/A 11/04/2019   Procedure: ESOPHAGOGASTRODUODENOSCOPY (EGD) WITH PROPOFOL;  Surgeon: Lin Landsman, MD;  Location: [facility] ENDOSCOPY;  Service: Gastroenterology;  Laterality: N/A;   EYE SURGERY  Dec 2021   Droopy eye lids   LEFT HEART CATH Right 01/25/2018   Procedure: Left Heart Cath and Coronary Angiography;  Surgeon: Dionisio David, MD;  Location: [place] CV LAB;  Service: Cardiovascular;  Laterality: Right;   LEFT HEART CATH AND CORONARY ANGIOGRAPHY Right 08/12/2016   Procedure: Left Heart Cath and Coronary Angiography;  Surgeon: Dionisio David, MD;  Location: [place] CV LAB;  Service: Cardiovascular;  Laterality: Right;   SMALL INTESTINE SURGERY     TONSILLECTOMY     TUBAL LIGATION      Prior to Admission medications   Medication Sig Start Date End Date Taking? Authorizing Provider  acyclovir (ZOVIRAX) 400 MG tablet Take 1 tablet (400 mg total) by mouth 2 (two) times daily. 04/20/20   [place] Malkin, MD  amphetamine-dextroamphetamine (ADDERALL) 30 MG tablet Take 30 mg by mouth 2 (two) times daily. 12/02/16   Myer Haff, MD  aspirin EC 81 MG tablet Take 81 mg by mouth daily.    [provider]   diazepam (VALIUM) 10 MG tablet Take 10 mg by mouth 3 (three) times daily as needed.    Myer Haff, MD  fluticasone (FLONASE) 50 MCG/ACT nasal spray Place 2 sprays into both nostrils daily as needed for allergies. 12/12/19   [place] Malkin, MD  meloxicam (MOBIC) 15 MG tablet Take 1 tablet (15 mg total) by mouth daily. 08/29/20   Hyatt, Max T, DPM  metoprolol tartrate (LOPRESSOR) 50 MG tablet Take 1 tablet (50 mg total) by mouth 2 (two) times daily. 03/13/20   [place] Malkin, MD  nystatin (MYCOSTATIN/NYSTOP) powder Apply topically 2 (two) times daily. 05/08/20   Steele Sizer, MD  pantoprazole (PROTONIX) 40 MG tablet Take 1 tablet (40 mg total) by mouth daily. 09/18/20   Virgel Manifold, MD    Allergies Amlodipine and Statins  Family History  Problem Relation Age of Onset   Diabetes Mother    CVA Mother    Heart disease Father    Heart disease Sister    Breast cancer Sister    Heart disease Brother    Heart disease Sister    Heart disease Sister  Heart disease Sister    Heart disease Brother    Heart disease Brother    Heart disease Brother    Heart disease Brother    Healthy Daughter    Healthy Daughter     Social History Social History   Tobacco Use   Smoking status: Former    Packs/day: 1.00    Years: 30.00    Pack years: 30.00    Types: Cigarettes    Quit date: 2006    Years since quitting: 16.5   Smokeless tobacco: Never   Tobacco comments:    I quit smoking cigarettes  Vaping Use   Vaping Use: Never used  Substance Use Topics   Alcohol use: Yes    Alcohol/week: 1.0 standard drink    Types: 1 Glasses of wine per week    Comment: occasionally    Drug use: No    Review of Systems Constitutional: No fever/chills Eyes: No visual changes. ENT: No sore throat. Cardiovascular: Denies chest pain. Respiratory: Denies shortness of breath. Gastrointestinal: No abdominal pain.  No nausea, no vomiting.  No diarrhea.   Genitourinary: Positive  for vaginal discomfort, urinary difficulty.  Musculoskeletal: Negative for back pain. Skin: Negative for rash. Neurological: Negative for headaches, focal weakness or numbness.  ____________________________________________   PHYSICAL EXAM:  VITAL SIGNS: ED Triage Vitals [10/20/20 0533]  Enc Vitals Group     BP (!) 163/107     Pulse Rate 76     Resp 16     Temp 98 F (36.7 C)     Temp Source Oral     SpO2 100 %     Weight 201 lb (91.2 kg)     Height 5\' 5"  (1.651 m)     Head Circumference      Peak Flow      Pain Score 6   Constitutional: Alert and oriented.  Eyes: Conjunctivae are normal.  ENT      Head: Normocephalic and atraumatic.      Nose: No congestion/rhinnorhea.      Mouth/Throat: Mucous membranes are moist.      Neck: No stridor. Hematological/Lymphatic/Immunilogical: No cervical lymphadenopathy. Cardiovascular: Normal rate, regular rhythm.  No murmurs, rubs, or gallops.  Respiratory: Normal respiratory effort without tachypnea nor retractions. Breath sounds are clear and equal bilaterally. No wheezes/rales/rhonchi. Gastrointestinal: Soft and non tender. No rebound. No guarding.  Genitourinary: No external lesions. No bleeding. Question small anterior prolapse. Pelvic exam performed with Newman Pies RN present Musculoskeletal: Normal range of motion in all extremities. No lower extremity edema. Neurologic:  Normal speech and language. No gross focal neurologic deficits are appreciated.  Skin:  Skin is warm, dry and intact. No rash noted. Psychiatric: Mood and affect are normal. Speech and behavior are normal. Patient exhibits appropriate insight and judgment.  ____________________________________________    LABS (pertinent positives/negatives)  CMP wnl except na 134, ca 8.4 UA clear, rbc and wbc 0-5, bacteria none seen CBC wbc 7.2, hgb 14.0, plt 323 Wet prep none seen yeast or clue  cells ____________________________________________   EKG  None  ____________________________________________    RADIOLOGY  None   ____________________________________________   PROCEDURES  Procedures  ____________________________________________   INITIAL IMPRESSION / ASSESSMENT AND PLAN / ED COURSE  Pertinent labs & imaging results that were available during my care of the patient were reviewed by me and considered in my medical decision making (see chart for details).   Patient presents to the emergency department today with vaginal concerns. I do have  concern that the patient has an anterior prolapse. Did discuss this with the patient. Do think she would benefit from ob/gyn follow up. Wet prep was negative. No UTI. No external lesions.  ____________________________________________   FINAL CLINICAL IMPRESSION(S) / ED DIAGNOSES  Final diagnoses:  Vaginal discomfort     Note: This dictation was prepared with Dragon dictation. Any transcriptional errors that result from this process are unintentional     Nance Pear, MD 10/20/20 1323

## 2020-10-20 NOTE — Discharge Instructions (Addendum)
Please seek medical attention for any high fevers, chest pain, shortness of breath, change in behavior, persistent vomiting, bloody stool or any other new or concerning symptoms.  

## 2020-10-24 ENCOUNTER — Telehealth: Payer: Self-pay | Admitting: Urology

## 2020-10-24 NOTE — Telephone Encounter (Signed)
DOS ---11/23/20  EPF LEFT--- [place] - 04/14/2018  PLAN DEDUCTIBLE - $0.00 OUT OF POCKET - $7,550.00 W/ $7,345.85 REMAINING COINSURANCE - 20% COPAY - $0.00   PER [employer] WEB SITE CPT CODE 21828 Notification or Prior Authorization is not required for the requested services.  Decision ID #:[id number]

## 2020-11-01 ENCOUNTER — Other Ambulatory Visit: Payer: Self-pay | Admitting: Physician Assistant

## 2020-11-01 ENCOUNTER — Other Ambulatory Visit: Payer: Self-pay

## 2020-11-01 ENCOUNTER — Ambulatory Visit: Payer: Medicare Other | Admitting: Family Medicine

## 2020-11-01 DIAGNOSIS — I10 Essential (primary) hypertension: Secondary | ICD-10-CM

## 2020-11-01 DIAGNOSIS — I25119 Atherosclerotic heart disease of native coronary artery with unspecified angina pectoris: Secondary | ICD-10-CM

## 2020-11-01 MED ORDER — METOPROLOL SUCCINATE ER 50 MG PO TB24: 50.0000 mg | ORAL_TABLET | Freq: Every day | ORAL | 3 refills | Status: DC

## 2020-11-01 MED ORDER — METOPROLOL TARTRATE 50 MG PO TABS: 50.0000 mg | ORAL_TABLET | Freq: Two times a day (BID) | ORAL | 5 refills | Status: DC

## 2020-11-03 IMAGING — CT CT ABD-PELV W/ CM
2 of 5 series · 16 of 46 positions shown, 18 images · IV contrast (iopamidol)
Comparison: 08/26/2017

CLINICAL DATA: Abdominal pain and bloating for 4 months. Nausea.
Constipation. Previous appendectomy.

EXAM:
CT ABDOMEN AND PELVIS WITH CONTRAST
TECHNIQUE: Multidetector CT imaging of the abdomen and pelvis was performed
using the standard protocol following bolus administration of
intravenous contrast.
CONTRAST:  100mL YDWXYY-DPP IOPAMIDOL (YDWXYY-DPP) INJECTION 61%

[Series 2: abd pelvis · axial · 0.68mm/px · z∈[-1541,-1116]mm · 13 of 97 slices shown, 15 images (1 of 2)]
[im 6/97  soft-tissue]
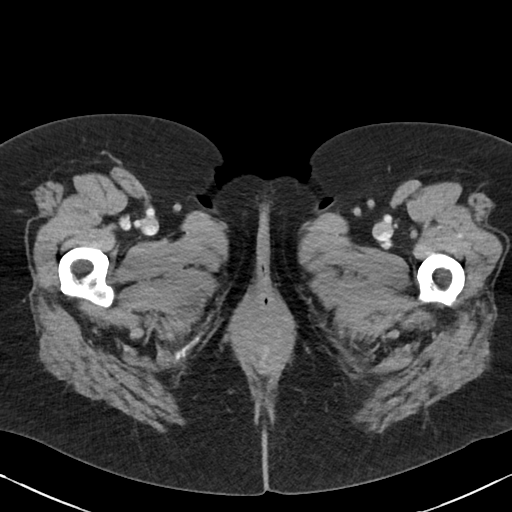
[im 6/97  bone]
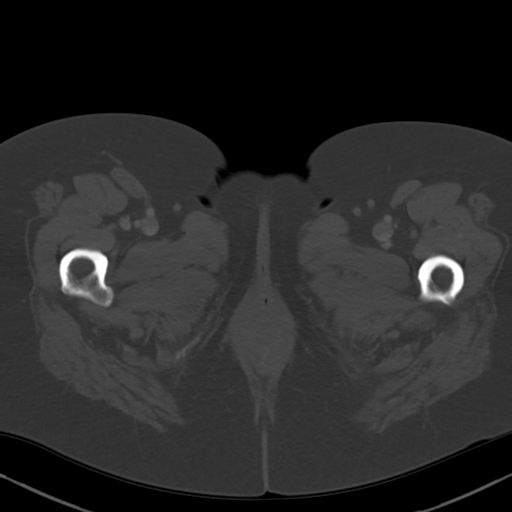
[im 12/97  soft-tissue]
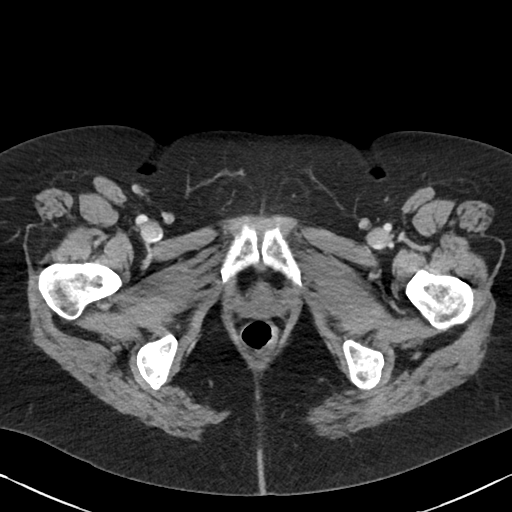
[im 23/97  soft-tissue]
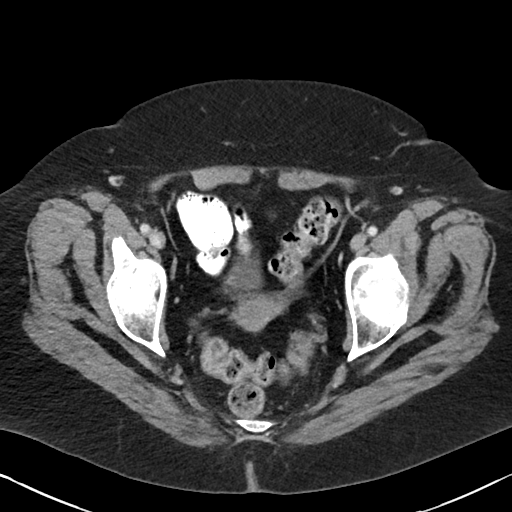
[im 29/97  soft-tissue]
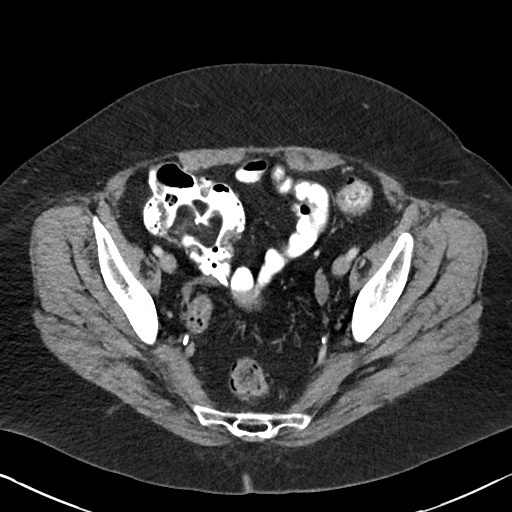
[im 34/97  soft-tissue]
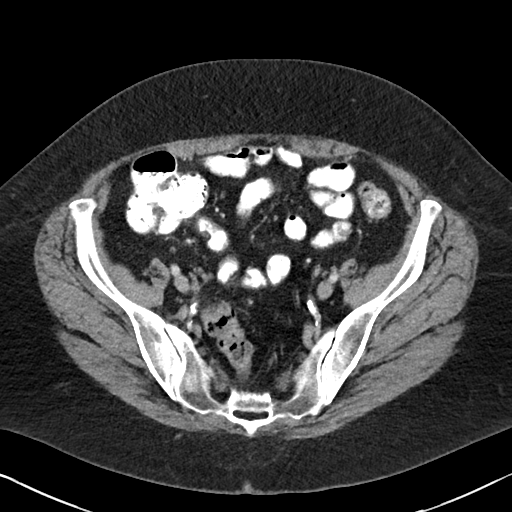
[im 40/97  soft-tissue]
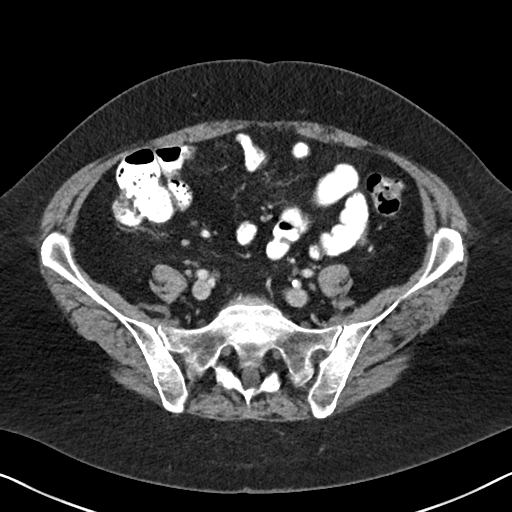
[im 51/97  soft-tissue]
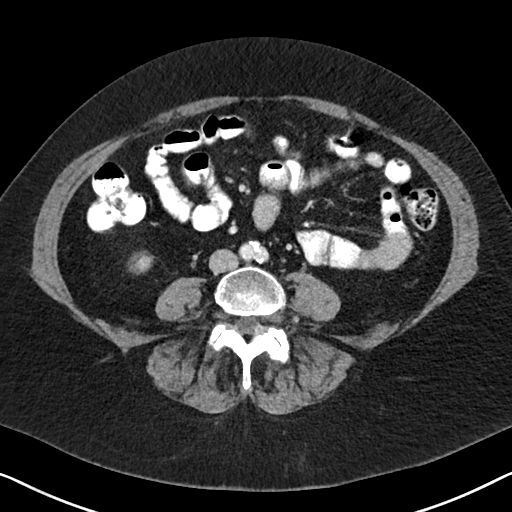
[im 57/97  soft-tissue]
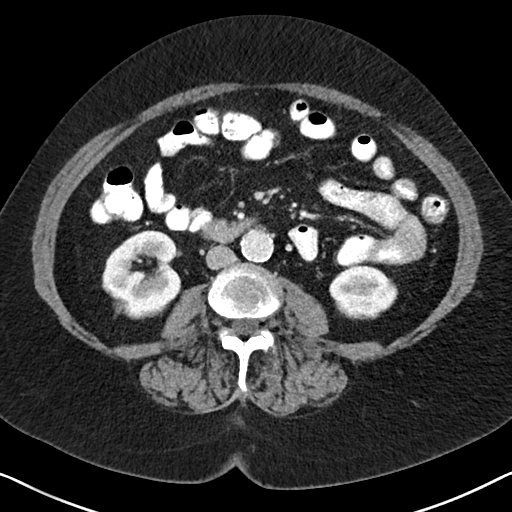
[im 63/97  soft-tissue]
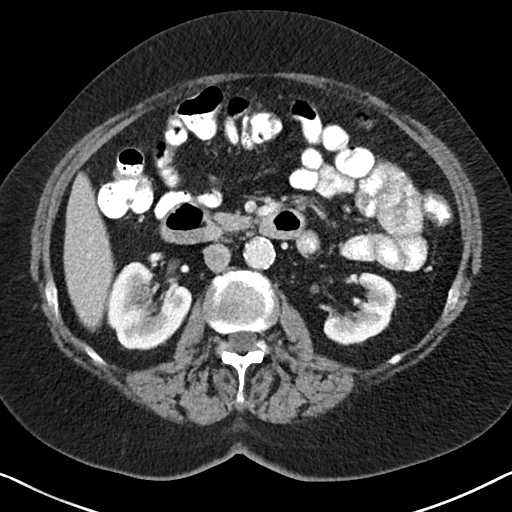
[im 63/97  bone]
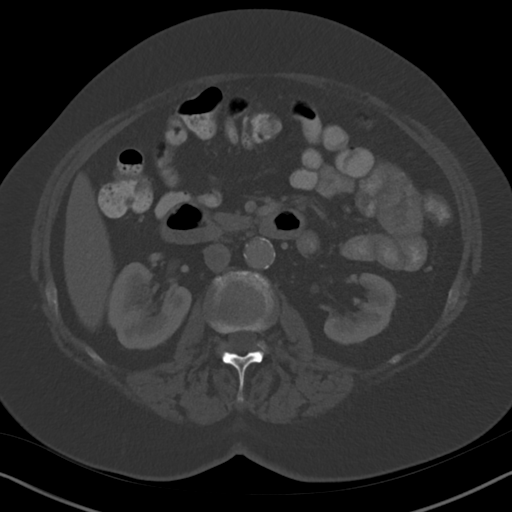
[im 68/97  soft-tissue]
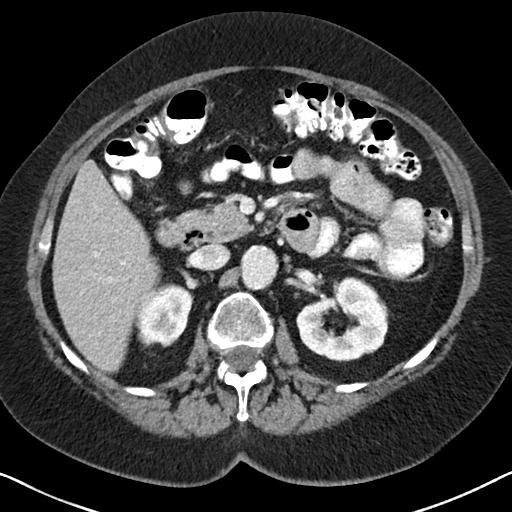
[im 74/97  soft-tissue]
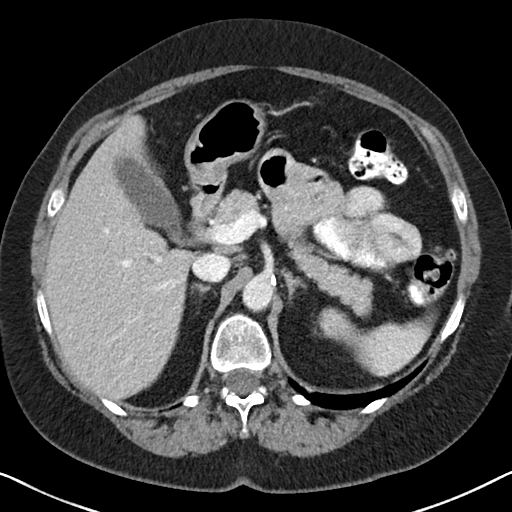
[im 85/97  soft-tissue]
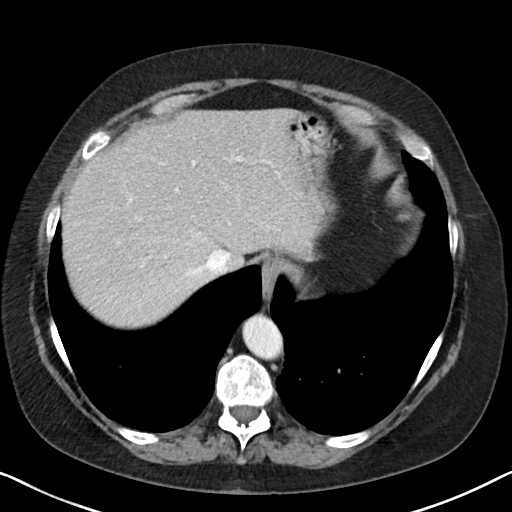
[im 91/97  soft-tissue]
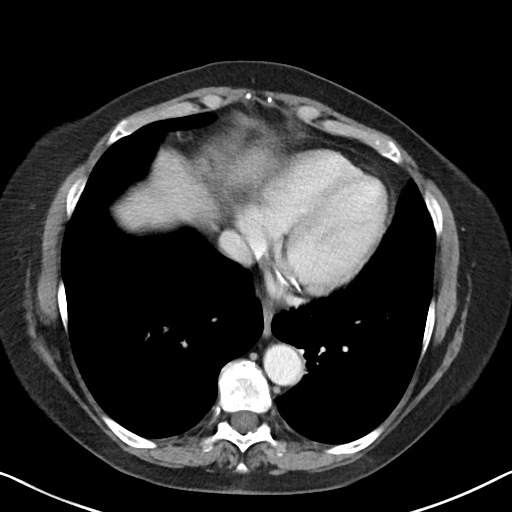

[Series 4: abd pelvis · coronal · 0.68mm/px · 3 of 165 slices shown (2 of 2)]
[im 55/165  soft-tissue]
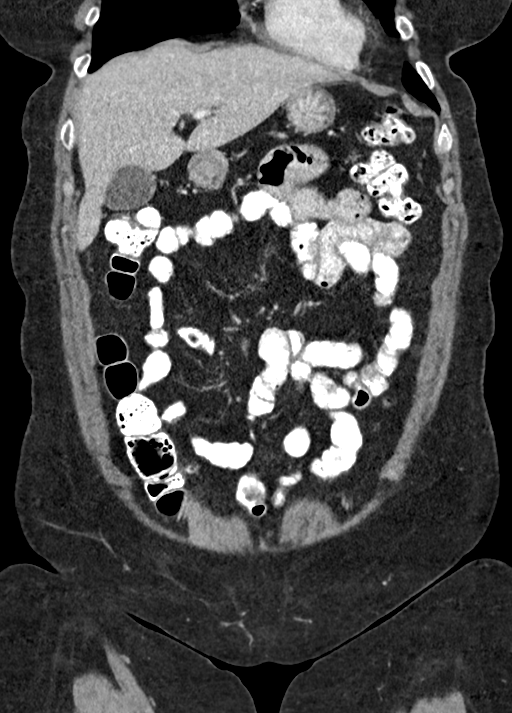
[im 73/165  soft-tissue]
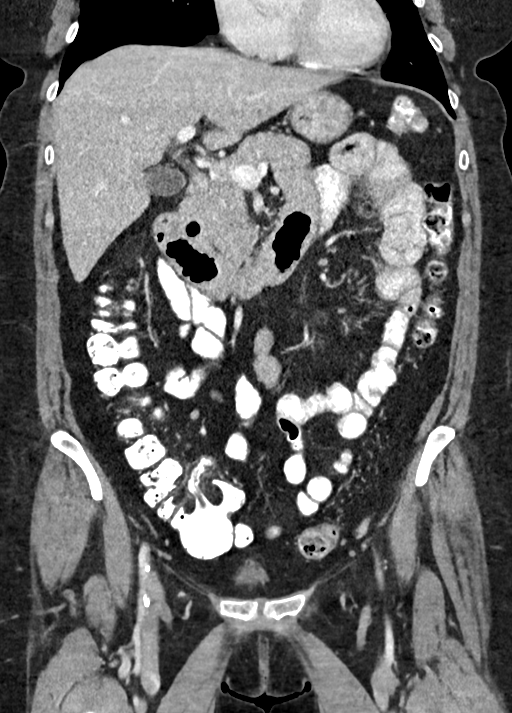
[im 92/165  soft-tissue]
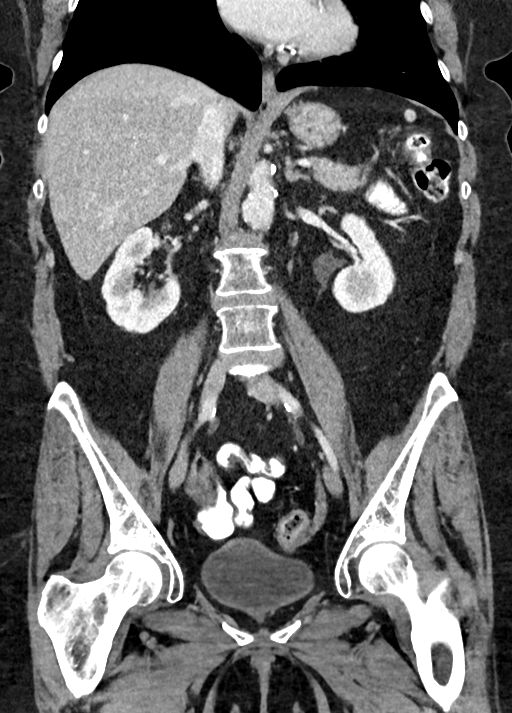

[16 of 46 positions shown; findings below may reference images not displayed]

FINDINGS: Lower Chest: No acute findings.

Hepatobiliary: No hepatic masses identified. Gallbladder is
unremarkable.

Pancreas:  No mass or inflammatory changes.

Spleen: Within normal limits in size and appearance.

Adrenals/Urinary Tract: No masses identified. No evidence of
hydronephrosis. Unremarkable unopacified urinary bladder.

Stomach/Bowel: No evidence of obstruction, inflammatory process or
abnormal fluid collections.

Vascular/Lymphatic: No pathologically enlarged lymph nodes. No
abdominal aortic aneurysm. Aortic atherosclerosis.

Reproductive:  No mass or other significant abnormality.

Other:  None.

Musculoskeletal:  No suspicious bone lesions identified.
IMPRESSION: Negative.  No acute findings or other significant abnormality.

## 2020-11-14 ENCOUNTER — Encounter: Payer: Medicare Other | Admitting: Podiatry

## 2020-11-21 ENCOUNTER — Other Ambulatory Visit: Payer: Self-pay | Admitting: Podiatry

## 2020-11-21 ENCOUNTER — Encounter: Payer: Medicare Other | Admitting: Podiatry

## 2020-11-21 MED ORDER — ONDANSETRON HCL 4 MG PO TABS: 4.0000 mg | ORAL_TABLET | Freq: Three times a day (TID) | ORAL | 0 refills | Status: DC | PRN

## 2020-11-21 MED ORDER — OXYCODONE-ACETAMINOPHEN 10-325 MG PO TABS: 1.0000 | ORAL_TABLET | Freq: Three times a day (TID) | ORAL | 0 refills | Status: AC | PRN

## 2020-11-21 MED ORDER — CEPHALEXIN 500 MG PO CAPS
500.0000 mg | ORAL_CAPSULE | Freq: Three times a day (TID) | ORAL | 0 refills | Status: DC
Start: 2020-11-21 — End: 2021-01-09

## 2020-11-23 DIAGNOSIS — M722 Plantar fascial fibromatosis: Secondary | ICD-10-CM | POA: Diagnosis not present

## 2020-11-23 DIAGNOSIS — M25572 Pain in left ankle and joints of left foot: Secondary | ICD-10-CM | POA: Diagnosis not present

## 2020-11-25 ENCOUNTER — Other Ambulatory Visit: Payer: Self-pay

## 2020-11-25 ENCOUNTER — Emergency Department: Payer: Medicare Other

## 2020-11-25 ENCOUNTER — Telehealth: Payer: Self-pay | Admitting: Podiatry

## 2020-11-25 ENCOUNTER — Emergency Department
Admission: EM | Admit: 2020-11-25 | Discharge: 2020-11-25 | Disposition: A | Discharge status: Home / Self Care | Payer: Medicare Other | Attending: Emergency Medicine | Admitting: Emergency Medicine

## 2020-11-25 DIAGNOSIS — I2510 Atherosclerotic heart disease of native coronary artery without angina pectoris: Secondary | ICD-10-CM | POA: Diagnosis not present

## 2020-11-25 DIAGNOSIS — Z955 Presence of coronary angioplasty implant and graft: Secondary | ICD-10-CM | POA: Diagnosis not present

## 2020-11-25 DIAGNOSIS — R918 Other nonspecific abnormal finding of lung field: Secondary | ICD-10-CM | POA: Diagnosis not present

## 2020-11-25 DIAGNOSIS — Z20822 Contact with and (suspected) exposure to covid-19: Secondary | ICD-10-CM | POA: Diagnosis not present

## 2020-11-25 DIAGNOSIS — Z79899 Other long term (current) drug therapy: Secondary | ICD-10-CM | POA: Insufficient documentation

## 2020-11-25 DIAGNOSIS — Z743 Need for continuous supervision: Secondary | ICD-10-CM | POA: Diagnosis not present

## 2020-11-25 DIAGNOSIS — R791 Abnormal coagulation profile: Secondary | ICD-10-CM | POA: Diagnosis not present

## 2020-11-25 DIAGNOSIS — R0689 Other abnormalities of breathing: Secondary | ICD-10-CM | POA: Diagnosis not present

## 2020-11-25 DIAGNOSIS — I10 Essential (primary) hypertension: Secondary | ICD-10-CM | POA: Insufficient documentation

## 2020-11-25 DIAGNOSIS — Z87891 Personal history of nicotine dependence: Secondary | ICD-10-CM | POA: Diagnosis not present

## 2020-11-25 DIAGNOSIS — Z7982 Long term (current) use of aspirin: Secondary | ICD-10-CM | POA: Diagnosis not present

## 2020-11-25 DIAGNOSIS — G4489 Other headache syndrome: Secondary | ICD-10-CM | POA: Diagnosis not present

## 2020-11-25 DIAGNOSIS — R410 Disorientation, unspecified: Secondary | ICD-10-CM | POA: Insufficient documentation

## 2020-11-25 DIAGNOSIS — R509 Fever, unspecified: Secondary | ICD-10-CM | POA: Insufficient documentation

## 2020-11-25 LAB — PROTIME-INR
INR: 1 (ref 0.8–1.2)
Prothrombin Time: 12.8 seconds (ref 11.4–15.2)

## 2020-11-25 LAB — COMPREHENSIVE METABOLIC PANEL
ALT: 16 U/L (ref 0–44)
AST: 24 U/L (ref 15–41)
Albumin: 3.9 g/dL (ref 3.5–5.0)
Alkaline Phosphatase: 71 U/L (ref 38–126)
Anion gap: 10 (ref 5–15)
BUN: 17 mg/dL (ref 8–23)
CO2: 30 mmol/L (ref 22–32)
Calcium: 8.7 mg/dL — ABNORMAL LOW (ref 8.9–10.3)
Chloride: 94 mmol/L — ABNORMAL LOW (ref 98–111)
Creatinine, Ser: 0.67 mg/dL (ref 0.44–1.00)
GFR, Estimated: >60 mL/min (ref >60)
Glucose, Bld: 124 mg/dL — ABNORMAL HIGH (ref 70–99)
Potassium: 4.2 mmol/L (ref 3.5–5.1)
Sodium: 134 mmol/L — ABNORMAL LOW (ref 135–145)
Total Bilirubin: 0.6 mg/dL (ref 0.3–1.2)
Total Protein: 7.3 g/dL (ref 6.5–8.1)

## 2020-11-25 LAB — CBC WITH DIFFERENTIAL/PLATELET
Abs Immature Granulocytes: 0.03 10*3/uL (ref 0.00–0.07)
Basophils Absolute: 0 10*3/uL (ref 0.0–0.1)
Basophils Relative: 1 %
Eosinophils Absolute: 0.1 10*3/uL (ref 0.0–0.5)
Eosinophils Relative: 1 %
HCT: 39.1 % (ref 36.0–46.0)
Hemoglobin: 12.9 g/dL (ref 12.0–15.0)
Immature Granulocytes: 0 %
Lymphocytes Relative: 28 %
Lymphs Abs: 2.2 10*3/uL (ref 0.7–4.0)
MCH: 32.7 pg (ref 26.0–34.0)
MCHC: 33 g/dL (ref 30.0–36.0)
MCV: 99.2 fL (ref 80.0–100.0)
Monocytes Absolute: 0.8 10*3/uL (ref 0.1–1.0)
Monocytes Relative: 10 %
Neutro Abs: 4.8 10*3/uL (ref 1.7–7.7)
Neutrophils Relative %: 60 %
Platelets: 280 10*3/uL (ref 150–400)
RBC: 3.94 MIL/uL (ref 3.87–5.11)
RDW: 14 % (ref 11.5–15.5)
WBC: 7.9 10*3/uL (ref 4.0–10.5)
nRBC: 0 % (ref 0.0–0.2)

## 2020-11-25 LAB — URINALYSIS, COMPLETE (UACMP) WITH MICROSCOPIC
Bacteria, UA: NONE SEEN
Bilirubin Urine: NEGATIVE
Glucose, UA: NEGATIVE mg/dL
Ketones, ur: NEGATIVE mg/dL
Leukocytes,Ua: NEGATIVE
Nitrite: NEGATIVE
Protein, ur: NEGATIVE mg/dL
Specific Gravity, Urine: 1.017 (ref 1.005–1.030)
Squamous Epithelial / HPF: NONE SEEN (ref 0–5)
pH: 5 (ref 5.0–8.0)

## 2020-11-25 LAB — LACTIC ACID, PLASMA: Lactic Acid, Venous: 1.2 mmol/L (ref 0.5–1.9)

## 2020-11-25 LAB — RESP PANEL BY RT-PCR (FLU A&B, COVID) ARPGX2
Influenza A by PCR: NEGATIVE
Influenza B by PCR: NEGATIVE
SARS Coronavirus 2 by RT PCR: NEGATIVE

## 2020-11-25 LAB — APTT: aPTT: 25 seconds (ref 24–36)

## 2020-11-25 MED ORDER — SODIUM CHLORIDE 0.9 % IV BOLUS (SEPSIS)
1000.0000 mL | Freq: Once | INTRAVENOUS | Status: AC
  Administered 2020-11-25: 1000 mL via INTRAVENOUS

## 2020-11-25 MED ORDER — SODIUM CHLORIDE 0.9 % IV SOLN
500.0000 mg | INTRAVENOUS | Status: DC
  Administered 2020-11-25: 500 mg via INTRAVENOUS
  Filled 2020-11-25: qty 500

## 2020-11-25 MED ORDER — SODIUM CHLORIDE 0.9 % IV SOLN
2.0000 g | INTRAVENOUS | Status: DC
  Administered 2020-11-25: 2 g via INTRAVENOUS
  Filled 2020-11-25: qty 20

## 2020-11-25 MED ORDER — LACTATED RINGERS IV SOLN: INTRAVENOUS | Status: DC

## 2020-11-25 NOTE — Telephone Encounter (Signed)
Spoke with the patient's daughter Olivia Mackie and she said her mother has been running a low-grade temperature at 100.7 degrees" does not seem or feel like herself and her head feels tingly".  Denied chest pain shortness of breath calf or knee pain.  She is on Keflex.  Doubtful it is a surgical site infection but with the other associated symptoms beyond low-grade temp I recommended she be evaluated at the ER and she will take her there today.  Lanae Crumbly, DPM 11/25/2020

## 2020-11-25 NOTE — Sepsis Progress Note (Signed)
Elink is following this code sepsis ?

## 2020-11-25 NOTE — ED Notes (Signed)
Pt given warm blankets.

## 2020-11-25 NOTE — ED Notes (Signed)
Pt contacted daughter to pick her up. Pt alert and oriented times 4

## 2020-11-25 NOTE — ED Notes (Signed)
Purewic placed on pt at this time.

## 2020-11-25 NOTE — Discharge Instructions (Addendum)
Your chest x-ray, urine test, blood tests, Covid/flu test, and CT scan of the head were all normal today. Your surgery site on the left foot is healing well and does not show any signs of infection. Please continue to monitor your symptoms and follow up with your primary care doctor this week.

## 2020-11-25 NOTE — ED Triage Notes (Signed)
BIB ems from home. foot surgery friday. Hallucinated last nigh6t. been running fever. currently 99.6 HR 82 Sinus on 12lead. 88% on RA placed on O2 and now at 93%. Pain in left leg.  20G LAC 134/64 BGL 131

## 2020-11-25 NOTE — ED Provider Notes (Signed)
[facility] Emergency Department Provider Note  ____________________________________________  Time seen: Approximately 10:07 PM  I have reviewed the triage vital signs and the nursing notes.   HISTORY  Chief Complaint Fever    HPI Linda Mejia is a 65 y.o. female who has a history of anxiety, GERD, hypertension who comes ED complaining of fever last night with feeling confused.  EMS report room air oxygen saturation of 88%.  She had foot surgery for Planter fasciitis 2 days ago and has persistent pain in the left leg which has remained the same since surgery.  No worsening symptoms.  Denies chest pain shortness of breath cough abdominal pain vomiting diarrhea or syncope.  She notes a history of sepsis and so came to the ED out of caution to be evaluated.    Past Medical History:  Diagnosis Date   Abnormal ankle brachial index (ABI) 12/02/2016   ADHD (attention deficit hyperactivity disorder)    AKI (acute kidney injury) ([place])    Allergy 2020   Can't take amlopine. Causes swelling   Anxiety    Aortic atherosclerosis ([place]) 12/02/2016   July 2018   Arthritis    Chronic back pain    Coronary artery disease    Depression 2020   I think everyone is depressed at this unprecedented time!   Encounter for HIV (human immunodeficiency virus) test    Essential (hemorrhagic) thrombocythemia ([place]) 05/08/2020   GERD (gastroesophageal reflux disease)    Herpes genitalis in women    History of stomach ulcers    x7 this year. 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[place]), left [facility])    Subclinical hypothyroidism    Subclinical hypothyroidism    Ulcer      Patient Active Problem List   Diagnosis Date Noted   Screen for colon cancer    Polyp of ascending colon    Polyp of sigmoid colon    Microscopic colitis     Polyp of descending colon    Intractable vomiting with nausea 06/23/2020   Severe sepsis ([place]) 06/23/2020   Essential (hemorrhagic) thrombocythemia ([place]) 05/08/2020   Osteoarthritis of multiple joints 01/05/2020   Anxiety and depression 11/14/2019   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 12/16/2018   ANA positive 09/22/2018   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preiser), 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/22/2018   Tenosynovitis of hand 09/22/2018   Stomach irritation    Pulmonary hypertension ([place]) 03/09/2018   Unstable angina ([place]) 01/23/2018   Coronary artery calcification seen on CT scan 12/02/2016   Aortic atherosclerosis ([place]) 12/02/2016   Abnormal ankle brachial index (ABI) 12/02/2016   Prediabetes 12/02/2016   Degenerative arthritis of knee, bilateral 12/27/2015   Class 2 severe obesity due to excess calories with serious comorbidity and body mass index (BMI) of 36.0 to 36.9 in adult ([place]) 12/27/2015   Reflux esophagitis    Polyarthralgia 06/05/2015   Subclinical hypothyroidism 03/26/2015   Herpes simplex type 2 infection 03/21/2015   Rhinitis, allergic 03/21/2015   ADHD (attention deficit hyperactivity disorder), inattentive type 09/20/2014   Episodic paroxysmal anxiety disorder 09/20/2014   History of gastric ulcer 09/20/2014   Substance abuse ([place]) 09/20/2014   HLD (hyperlipidemia) 10/03/2009   Coronary artery disease involving native coronary artery of native heart with angina pectoris ([place]) 10/02/2009   Hypertension goal BP (blood pressure) <  140/90 10/02/2009     Past Surgical History:  Procedure Laterality Date   APPENDECTOMY     CARDIAC CATHETERIZATION     CESAREAN SECTION     x2   COLON SURGERY     interception   COLONOSCOPY WITH PROPOFOL N/A 08/17/2020   Procedure: COLONOSCOPY WITH PROPOFOL;  Surgeon: Virgel Manifold, MD;  Location: [facility] ENDOSCOPY;  Service: Endoscopy;  Laterality: N/A;   CORONARY ANGIOPLASTY     stents...  2000   ESOPHAGOGASTRODUODENOSCOPY  (EGD) WITH PROPOFOL N/A 09/21/2015   Procedure: ESOPHAGOGASTRODUODENOSCOPY (EGD) WITH PROPOFOL;  Surgeon: Lucilla Lame, MD;  Location: [facility] ENDOSCOPY;  Service: Endoscopy;  Laterality: N/A;   ESOPHAGOGASTRODUODENOSCOPY (EGD) WITH PROPOFOL N/A 07/30/2016   Procedure: ESOPHAGOGASTRODUODENOSCOPY (EGD) WITH PROPOFOL;  Surgeon: Jonathon Bellows, MD;  Location: [facility] ENDOSCOPY;  Service: Endoscopy;  Laterality: N/A;   ESOPHAGOGASTRODUODENOSCOPY (EGD) WITH PROPOFOL N/A 04/23/2018   Procedure: ESOPHAGOGASTRODUODENOSCOPY (EGD) WITH PROPOFOL;  Surgeon: Virgel Manifold, MD;  Location: [facility] ENDOSCOPY;  Service: Endoscopy;  Laterality: N/A;   ESOPHAGOGASTRODUODENOSCOPY (EGD) WITH PROPOFOL N/A 11/04/2019   Procedure: ESOPHAGOGASTRODUODENOSCOPY (EGD) WITH PROPOFOL;  Surgeon: Lin Landsman, MD;  Location: [facility] ENDOSCOPY;  Service: Gastroenterology;  Laterality: N/A;   EYE SURGERY  Dec 2021   Droopy eye lids   LEFT HEART CATH Right 01/25/2018   Procedure: Left Heart Cath and Coronary Angiography;  Surgeon: Dionisio David, MD;  Location: [place] CV LAB;  Service: Cardiovascular;  Laterality: Right;   LEFT HEART CATH AND CORONARY ANGIOGRAPHY Right 08/12/2016   Procedure: Left Heart Cath and Coronary Angiography;  Surgeon: Dionisio David, MD;  Location: [place] CV LAB;  Service: Cardiovascular;  Laterality: Right;   SMALL INTESTINE SURGERY     TONSILLECTOMY     TUBAL LIGATION       Prior to Admission medications   Medication Sig Start Date End Date Taking? Authorizing Provider  acyclovir (ZOVIRAX) 400 MG tablet Take 1 tablet (400 mg total) by mouth 2 (two) times daily. 04/20/20   Towanda Malkin, MD  amphetamine-dextroamphetamine (ADDERALL) 30 MG tablet Take 30 mg by mouth 2 (two) times daily. 12/02/16   Myer Haff, MD  aspirin EC 81 MG tablet Take 81 mg by mouth daily.    [provider]  cephALEXin (KEFLEX) 500 MG capsule Take 1 capsule (500 mg total) by mouth 3 (three) times  daily. 11/21/20   Hyatt, Max T, DPM  diazepam (VALIUM) 10 MG tablet Take 10 mg by mouth 3 (three) times daily as needed.    Myer Haff, MD  fluticasone (FLONASE) 50 MCG/ACT nasal spray Place 2 sprays into both nostrils daily as needed for allergies. 12/12/19   Towanda Malkin, MD  meloxicam (MOBIC) 15 MG tablet Take 1 tablet (15 mg total) by mouth daily. 08/29/20   Hyatt, Max T, DPM  metoprolol succinate (TOPROL-XL) 50 MG 24 hr tablet Take 1 tablet (50 mg total) by mouth daily. Take with or immediately following a meal. 11/01/20   Sable Feil, PA-C  metoprolol tartrate (LOPRESSOR) 50 MG tablet Take 1 tablet (50 mg total) by mouth 2 (two) times daily. 11/01/20   Sable Feil, PA-C  NYSTATIN powder APPLY TO THE AFFECTED AREA ON SKIN TWICE DAILY 10/22/20   Ancil Boozer, Drue Stager, MD  ondansetron (ZOFRAN) 4 MG tablet Take 1 tablet (4 mg total) by mouth every 8 (eight) hours as needed. 11/21/20   Hyatt, Max T, DPM  oxyCODONE-acetaminophen (PERCOCET) 10-325 MG tablet Take 1 tablet by mouth every  8 (eight) hours as needed for up to 7 days for pain. 11/21/20 11/28/20  Hyatt, Max T, DPM  pantoprazole (PROTONIX) 40 MG tablet Take 1 tablet (40 mg total) by mouth daily. 09/18/20   Virgel Manifold, MD     Allergies Amlodipine and Statins   Family History  Problem Relation Age of Onset   Diabetes Mother    CVA Mother    Heart disease Father    Heart disease Sister    Breast cancer Sister    Heart disease Brother    Heart disease Sister    Heart disease Sister    Heart disease Sister    Heart disease Brother    Heart disease Brother    Heart disease Brother    Heart disease Brother    Healthy Daughter    Healthy Daughter     Social History Social History   Tobacco Use   Smoking status: Former    Packs/day: 1.00    Years: 30.00    Pack years: 30.00    Types: Cigarettes    Quit date: 2006    Years since quitting: 16.6   Smokeless tobacco: Never   Tobacco comments:    I quit smoking  cigarettes  Vaping Use   Vaping Use: Never used  Substance Use Topics   Alcohol use: Yes    Alcohol/week: 1.0 standard drink    Types: 1 Glasses of wine per week    Comment: occasionally    Drug use: No    Review of Systems  Constitutional:   Positive fever and chills.  ENT:   No sore throat. No rhinorrhea. Cardiovascular:   No chest pain or syncope. Respiratory:   No dyspnea or cough. Gastrointestinal:   Negative for abdominal pain, vomiting and diarrhea.  Musculoskeletal:   Negative for focal pain or swelling All other systems reviewed and are negative except as documented above in ROS and HPI.  ____________________________________________   PHYSICAL EXAM:  VITAL SIGNS: ED Triage Vitals  Enc Vitals Group     BP 11/25/20 1806 (!) 160/114     Pulse Rate 11/25/20 1806 85     Resp 11/25/20 1806 (!) 24     Temp 11/25/20 1806 99 F (37.2 C)     Temp Source 11/25/20 1806 Oral     SpO2 11/25/20 1806 96 %     Weight 11/25/20 1808 200 lb (90.7 kg)     Height 11/25/20 1808 '5\' 5"'$  (1.651 m)     Head Circumference --      Peak Flow --      Pain Score 11/25/20 1808 0     Pain Loc --      Pain Edu? --      Excl. in [place]? --     Vital signs reviewed, nursing assessments reviewed.   Constitutional:   Alert and oriented. Non-toxic appearance. Eyes:   Conjunctivae are normal. EOMI. PERRL. ENT      Head:   Normocephalic and atraumatic.      Nose:   Wearing a mask.      Mouth/Throat:   Wearing a mask.      Neck:   No meningismus. Full ROM. Hematological/Lymphatic/Immunilogical:   No cervical lymphadenopathy. Cardiovascular:   RRR. Symmetric bilateral radial and DP pulses.  No murmurs. Cap refill less than 2 seconds. Respiratory:   Normal respiratory effort without tachypnea/retractions. Breath sounds are clear and equal bilaterally. No wheezes/rales/rhonchi.  Oxygen saturation 96% on room air Gastrointestinal:  Soft and nontender. Non distended. There is no CVA tenderness.  No  rebound, rigidity, or guarding. Genitourinary:   deferred Musculoskeletal:   Normal range of motion in all extremities. No joint effusions.  No lower extremity tenderness.  No edema.  Left leg unwrapped.  No inflammation or tenderness.  Recent surgical incisions are clean and dry, no signs of infection, no purulent drainage.  No significant swelling of the foot. Neurologic:   Normal speech and language.  Motor grossly intact. No acute focal neurologic deficits are appreciated.  Skin:    Skin is warm, dry and intact. No rash noted.  No petechiae, purpura, or bullae.  ____________________________________________    LABS (pertinent positives/negatives) (all labs ordered are listed, but only abnormal results are displayed) Labs Reviewed  COMPREHENSIVE METABOLIC PANEL - Abnormal; Notable for the following components:      Result Value   Sodium 134 (*)    Chloride 94 (*)    Glucose, Bld 124 (*)    Calcium 8.7 (*)    All other components within normal limits  URINALYSIS, COMPLETE (UACMP) WITH MICROSCOPIC - Abnormal; Notable for the following components:   Color, Urine YELLOW (*)    APPearance CLEAR (*)    Hgb urine dipstick SMALL (*)    All other components within normal limits  RESP PANEL BY RT-PCR (FLU A&B, COVID) ARPGX2  CULTURE, BLOOD (ROUTINE X 2)  CULTURE, BLOOD (ROUTINE X 2)  URINE CULTURE  LACTIC ACID, PLASMA  CBC WITH DIFFERENTIAL/PLATELET  PROTIME-INR  APTT   ____________________________________________   EKG Interpreted by me Sinus rhythm rate of 81, normal axis and intervals.  Normal QRS ST segments and T waves.   ____________________________________________    RADIOLOGY  CT HEAD WO CONTRAST (5MM)  Result Date: 11/25/2020 CLINICAL DATA:  Altered mental status EXAM: CT HEAD WITHOUT CONTRAST TECHNIQUE: Contiguous axial images were obtained from the base of the skull through the vertex without intravenous contrast. COMPARISON:  03/03/2015 FINDINGS: Brain: No  evidence of acute infarction, hemorrhage, hydrocephalus, extra-axial collection or mass lesion/mass effect. Vascular: No hyperdense vessel or unexpected calcification. Skull: Normal. Negative for fracture or focal lesion. Sinuses/Orbits: No acute finding. Other: None. IMPRESSION: No acute intracranial abnormality noted. Electronically Signed   By: Inez Catalina M.D.   On: 11/25/2020 21:21   DG Chest Port 1 View  Result Date: 11/25/2020 CLINICAL DATA:  History of recent foot surgery with possible sepsis, initial encounter EXAM: PORTABLE CHEST 1 VIEW COMPARISON:  06/23/2020 FINDINGS: Cardiac shadow is within normal limits. The lungs are well aerated bilaterally. No focal infiltrate or effusion is seen. No bony abnormality is noted. IMPRESSION: No active disease. Electronically Signed   By: Inez Catalina M.D.   On: 11/25/2020 18:37    ____________________________________________   PROCEDURES Procedures  ____________________________________________  DIFFERENTIAL DIAGNOSIS   Pneumonia, UTI, COVID, dehydration, electrolyte abnormality, intracranial mass  CLINICAL IMPRESSION / ASSESSMENT AND PLAN / ED COURSE  Medications ordered in the ED: Medications  lactated ringers infusion ( Intravenous New Bag/Given 11/25/20 1828)  cefTRIAXone (ROCEPHIN) 2 g in sodium chloride 0.9 % 100 mL IVPB (0 g Intravenous Stopped 11/25/20 1848)  azithromycin (ZITHROMAX) 500 mg in sodium chloride 0.9 % 250 mL IVPB (0 mg Intravenous Stopped 11/25/20 1948)  sodium chloride 0.9 % bolus 1,000 mL (1,000 mLs Intravenous New Bag/Given 11/25/20 1825)    Pertinent labs & imaging results that were available during my care of the patient were reviewed by me and considered in my medical decision making (see  chart for details).  Bertram Millard ZIONNAH CASTANOS was evaluated in Emergency Department on 11/25/2020 for the symptoms described in the history of present illness. She was evaluated in the context of the global COVID-19 pandemic, which necessitated  consideration that the patient might be at risk for infection with the SARS-CoV-2 virus that causes COVID-19. Institutional protocols and algorithms that pertain to the evaluation of patients at risk for COVID-19 are in a state of rapid change based on information released by regulatory bodies including the CDC and federal and state organizations. These policies and algorithms were followed during the patient's care in the ED.   Patient presents with fever, concern for confusion last night.  In the ED vital signs are normal, oxygenation is normal.  Extensive work-up undertaken which is all unremarkable and reassuring including chest x-ray labs COVID and flu and CT head.  Exam is benign and reassuring, no signs of soft tissue infection.  On reevaluation, patient states that she feels fine, she is comfortable with discharge, monitoring symptoms, following up with primary care this week.  She will return to the emergency department if she has any new or worsening symptoms.  Suspect this is postop fever related to healing process, no underlying infectious source.  Stable for discharge.  Left lower leg was rewrapped by myself prior to discharge with gauze, Ace, Curlex and her walking boot similar to how it had been done after surgery.      ____________________________________________   FINAL CLINICAL IMPRESSION(S) / ED DIAGNOSES    Final diagnoses:  Fever, unspecified fever cause     ED Discharge Orders     None       Portions of this note were generated with dragon dictation software. Dictation errors may occur despite best attempts at proofreading.    Carrie Mew, MD 11/25/20 2225

## 2020-11-25 NOTE — Progress Notes (Signed)
CODE SEPSIS - PHARMACY COMMUNICATION  **Broad Spectrum Antibiotics should be administered within 1 hour of Sepsis diagnosis**  Time Code Sepsis Called/Page Received: 1815  Antibiotics Ordered: Ceftriaxone 2g IV q24 Azithromycin 500 mg q24h  Time of 1st antibiotic administration: [place] ,PharmD Clinical Pharmacist  11/25/2020  6:15 PM

## 2020-11-27 LAB — URINE CULTURE: Culture: NO GROWTH

## 2020-11-28 ENCOUNTER — Ambulatory Visit (INDEPENDENT_AMBULATORY_CARE_PROVIDER_SITE_OTHER): Payer: Medicare Other | Admitting: Podiatry

## 2020-11-28 ENCOUNTER — Encounter: Payer: Self-pay | Admitting: Podiatry

## 2020-11-28 ENCOUNTER — Other Ambulatory Visit: Payer: Self-pay

## 2020-11-28 DIAGNOSIS — M722 Plantar fascial fibromatosis: Secondary | ICD-10-CM

## 2020-11-28 DIAGNOSIS — Z9889 Other specified postprocedural states: Secondary | ICD-10-CM

## 2020-11-28 NOTE — Progress Notes (Signed)
She presents today first postop visit 11/23/2020 EPF left.  She went to the emergency department after surgery for a throbbing headache was given 2 bags of IV antibiotics and was told that she probably had an infection though this was only 1 to 2 days after surgery.  Objective: Vital signs are stable alert and oriented x3.  Pulses are palpable.  There is no erythema edema cellulitis drainage or odor.  Sutures are intact margins are well coapted.  Assessment: Well-healing surgical foot.  Plan: Redressed today with a light bandage and placed her back in her cam boot which she will leave on 24/7 till follow-up with her in 1 week.

## 2020-11-30 LAB — CULTURE, BLOOD (ROUTINE X 2)
Culture: NO GROWTH
Culture: NO GROWTH
Special Requests: ADEQUATE
Special Requests: ADEQUATE

## 2020-12-05 ENCOUNTER — Telehealth: Payer: Self-pay | Admitting: *Deleted

## 2020-12-05 ENCOUNTER — Encounter: Payer: Medicare Other | Admitting: Podiatry

## 2020-12-05 NOTE — Telephone Encounter (Signed)
"  I want to know when I can get my foot wet.  I had surgery on 11/23/2020.  Can they let me know through my MyChart?"  I will send his assistant a message to call you back."

## 2020-12-12 ENCOUNTER — Encounter: Payer: Self-pay | Admitting: Podiatry

## 2020-12-12 ENCOUNTER — Ambulatory Visit (INDEPENDENT_AMBULATORY_CARE_PROVIDER_SITE_OTHER): Payer: Medicare Other | Admitting: Podiatry

## 2020-12-12 ENCOUNTER — Other Ambulatory Visit: Payer: Self-pay

## 2020-12-12 DIAGNOSIS — M722 Plantar fascial fibromatosis: Secondary | ICD-10-CM

## 2020-12-12 DIAGNOSIS — Z9889 Other specified postprocedural states: Secondary | ICD-10-CM

## 2020-12-12 NOTE — Progress Notes (Signed)
She presents today 2-week status post endoscopic plantar fasciotomy left foot states that he is feeling good ready to get out of this boot.  Denies fever chills nausea vomiting muscle aches pains calf pain back pain chest pain shortness of breath.  States that she has made short trips to her house without her boot.  Objective: Vital signs are stable alert oriented x3.  Pulses are palpable.  There is no erythema edema cellulitis drainage or odor sutures are intact to the medial lateral borders and were removed today in total margins remain well coapted.  No pain on palpation of the heel.  Assessment: Well-healing surgical endoscopic fasciotomy.  Plan: Put her back in her cam walker today however I will allow her to start utilizing tennis shoes during the day no bare feet flip-flops sandals or dress shoes otherwise she is to maintain her boot or her tennis shoe and will continue the use of the boot at nighttime for 1 more month.

## 2020-12-19 ENCOUNTER — Ambulatory Visit (INDEPENDENT_AMBULATORY_CARE_PROVIDER_SITE_OTHER): Payer: Medicare Other | Admitting: Podiatry

## 2020-12-19 ENCOUNTER — Encounter: Payer: Medicare Other | Admitting: Podiatry

## 2020-12-19 ENCOUNTER — Encounter: Payer: Self-pay | Admitting: Podiatry

## 2020-12-19 ENCOUNTER — Other Ambulatory Visit: Payer: Self-pay

## 2020-12-19 DIAGNOSIS — M722 Plantar fascial fibromatosis: Secondary | ICD-10-CM

## 2020-12-19 DIAGNOSIS — Z9889 Other specified postprocedural states: Secondary | ICD-10-CM

## 2020-12-19 NOTE — Progress Notes (Signed)
She presents today for postop visit date of surgery 11/23/2020 status post endoscopic plantar fasciotomy states that it feels tingly but it has not hurt.  She presents today in her cam walker.  Objective: Vital signs are stable she alert and oriented x3 there is no erythema edema cellulitis drainage or odor she has no pain on palpation medial calcaneal tubercle of the left foot.  Assessment: Well-healing surgical foot.  Plan: Encouraged her to wear tennis shoe at all times with exception of nighttime when she is continue to wear the cam walker to bed for the next month.  Follow-up with her in about 2 weeks

## 2020-12-25 ENCOUNTER — Ambulatory Visit: Payer: Medicare Other

## 2021-01-02 ENCOUNTER — Encounter: Payer: Medicare Other | Admitting: Podiatry

## 2021-01-09 ENCOUNTER — Ambulatory Visit (INDEPENDENT_AMBULATORY_CARE_PROVIDER_SITE_OTHER): Payer: Medicare Other | Admitting: Podiatry

## 2021-01-09 ENCOUNTER — Other Ambulatory Visit: Payer: Self-pay

## 2021-01-09 ENCOUNTER — Encounter: Payer: Self-pay | Admitting: Podiatry

## 2021-01-09 DIAGNOSIS — M722 Plantar fascial fibromatosis: Secondary | ICD-10-CM

## 2021-01-09 DIAGNOSIS — Z9889 Other specified postprocedural states: Secondary | ICD-10-CM

## 2021-01-09 NOTE — Progress Notes (Signed)
She presents today for her postop visit date of surgery 11/23/2020 status post EPF left states that they EPF portion of the procedure feels perfect she still has numbness and tingling that is in the hallux left.  And has noticed a small fatty knot area on the medial band of the plantar fascia.  She states that she has not been wearing her night splint as I had recommended.  Objective: Vital signs are stable alert and oriented x3 she has a 1 cm in diameter plantar fibroma to the medial band of the plantar fascia distal to the mid arch.  No open lesions or wounds are noted.  She has no pain on palpation of the surgical site.  Numbness and tingling of the toe is present upon touching but sensation is intact.  Assessment well-healing endoscopic plantar fasciotomy.  Some paresthesias still present most likely from surgery and plantar fibroma has developed.  Plan: We discussed possible need for injection of the plantar fibroma I expressed to her that the numbness and tingling should resolve and she needs to continue to wear her shoes on a regular basis.  I will follow-up with her in 1 month if necessary.

## 2021-01-25 ENCOUNTER — Emergency Department
Admission: EM | Admit: 2021-01-25 | Discharge: 2021-01-25 | Disposition: A | Discharge status: Home / Self Care | Payer: Medicare Other

## 2021-01-30 DIAGNOSIS — I2510 Atherosclerotic heart disease of native coronary artery without angina pectoris: Secondary | ICD-10-CM | POA: Insufficient documentation

## 2021-02-04 ENCOUNTER — Other Ambulatory Visit: Payer: Self-pay | Admitting: Internal Medicine

## 2021-02-04 DIAGNOSIS — I729 Aneurysm of unspecified site: Secondary | ICD-10-CM

## 2021-02-11 ENCOUNTER — Other Ambulatory Visit: Payer: Self-pay | Admitting: Internal Medicine

## 2021-02-11 DIAGNOSIS — I729 Aneurysm of unspecified site: Secondary | ICD-10-CM

## 2021-02-13 ENCOUNTER — Encounter: Payer: Self-pay | Admitting: Podiatry

## 2021-02-13 ENCOUNTER — Other Ambulatory Visit: Payer: Self-pay

## 2021-02-13 ENCOUNTER — Ambulatory Visit (INDEPENDENT_AMBULATORY_CARE_PROVIDER_SITE_OTHER): Payer: Medicare Other | Admitting: Podiatry

## 2021-02-13 DIAGNOSIS — Z9889 Other specified postprocedural states: Secondary | ICD-10-CM

## 2021-02-13 DIAGNOSIS — M722 Plantar fascial fibromatosis: Secondary | ICD-10-CM

## 2021-02-17 NOTE — Progress Notes (Signed)
  Subjective:  Patient ID: Linda Mejia, female    DOB: [birth year],  MRN: [id number]  Chief Complaint  Patient presents with   Routine Post Op    DOS 11/23/2020 EPF LT   "The heel is still sore, but this knot in my arch is what's really hurting." Suggested injection today and back to see Hyatt in 2 weeks for eval and possible get scheduled for MRI-she would like to proceed with the surgical route    65 y.o. female presents with the above complaint. History confirmed with patient.  She is a painful mass in the middle of the left arch  Objective:  Physical Exam: warm, good capillary refill, no trophic changes or ulcerative lesions, normal DP and PT pulses, and normal sensory exam. Left Foot: Well-healed surgical incision there is a painful fibroma in the middle of the arch Assessment:   1. Plantar fascial fibromatosis of left foot   2. Plantar fasciitis, left   3. Status post left foot surgery      Plan:  Patient was evaluated and treated and all questions answered.  Discussed etiology and treatment options of plantar fibromas as well as injections versus surgical excision.  I recommended injection today.  Following sterile prep with alcohol a regional field block was given with lidocaine and Marcaine.  I then injected 5 mg of Kenalog and 4 mg of dexamethasone directly into the lesion in multiple sites.  She tolerated procedure well and they were dressed with Band-Aids.  She will follow-up with Dr. Milinda Pointer at her next visit in regards to this  Return in about 1 month (around 03/15/2021) for check Fibroma .

## 2021-03-04 ENCOUNTER — Other Ambulatory Visit: Payer: Self-pay | Admitting: *Deleted

## 2021-03-04 DIAGNOSIS — Z87891 Personal history of nicotine dependence: Secondary | ICD-10-CM

## 2021-03-11 ENCOUNTER — Other Ambulatory Visit: Payer: Self-pay | Admitting: Internal Medicine

## 2021-03-11 DIAGNOSIS — S299XXA Unspecified injury of thorax, initial encounter: Secondary | ICD-10-CM

## 2021-03-11 DIAGNOSIS — W19XXXA Unspecified fall, initial encounter: Secondary | ICD-10-CM

## 2021-03-12 ENCOUNTER — Other Ambulatory Visit: Payer: Self-pay

## 2021-03-12 ENCOUNTER — Ambulatory Visit
Admission: RE | Admit: 2021-03-12 | Discharge: 2021-03-12 | Disposition: A | Discharge status: Home / Self Care | Payer: Medicare Other | Source: Ambulatory Visit | Attending: Internal Medicine | Admitting: Internal Medicine

## 2021-03-12 DIAGNOSIS — I2510 Atherosclerotic heart disease of native coronary artery without angina pectoris: Secondary | ICD-10-CM | POA: Insufficient documentation

## 2021-03-12 DIAGNOSIS — I700 Atherosclerosis of aorta: Secondary | ICD-10-CM | POA: Diagnosis not present

## 2021-03-12 DIAGNOSIS — R079 Chest pain, unspecified: Secondary | ICD-10-CM | POA: Insufficient documentation

## 2021-03-12 DIAGNOSIS — Y939 Activity, unspecified: Secondary | ICD-10-CM | POA: Diagnosis not present

## 2021-03-12 DIAGNOSIS — W19XXXA Unspecified fall, initial encounter: Secondary | ICD-10-CM | POA: Insufficient documentation

## 2021-03-12 DIAGNOSIS — S299XXA Unspecified injury of thorax, initial encounter: Secondary | ICD-10-CM | POA: Insufficient documentation

## 2021-03-12 DIAGNOSIS — Y929 Unspecified place or not applicable: Secondary | ICD-10-CM | POA: Diagnosis not present

## 2021-03-12 LAB — POCT I-STAT CREATININE: Creatinine, Ser: 1.3 mg/dL — ABNORMAL HIGH (ref 0.44–1.00)

## 2021-03-12 MED ORDER — IOHEXOL 350 MG/ML SOLN
75.0000 mL | Freq: Once | INTRAVENOUS | Status: AC | PRN
  Administered 2021-03-12: 75 mL via INTRAVENOUS

## 2021-03-18 ENCOUNTER — Encounter: Payer: Medicare Other | Admitting: Podiatry

## 2021-03-29 ENCOUNTER — Other Ambulatory Visit: Payer: Self-pay

## 2021-03-29 ENCOUNTER — Other Ambulatory Visit: Payer: Self-pay | Admitting: Acute Care

## 2021-03-29 ENCOUNTER — Encounter: Payer: Self-pay | Admitting: Acute Care

## 2021-03-29 ENCOUNTER — Telehealth (INDEPENDENT_AMBULATORY_CARE_PROVIDER_SITE_OTHER): Payer: Medicare Other | Admitting: Acute Care

## 2021-03-29 ENCOUNTER — Telehealth: Payer: Self-pay | Admitting: Acute Care

## 2021-03-29 DIAGNOSIS — Z87891 Personal history of nicotine dependence: Secondary | ICD-10-CM | POA: Diagnosis not present

## 2021-03-29 NOTE — Telephone Encounter (Signed)
Lm x1 for patient.  

## 2021-03-29 NOTE — Telephone Encounter (Signed)
Pt had virtual visit on 12/16. Pt states there was a misunderstanding. Pt states it was written that "pt stopped smoking in 2016" when pt actually stopped smoking in 2006. Pt would like this corrected please as she is very proud of herself for it being 16 years. Please advise [phone]

## 2021-03-29 NOTE — Progress Notes (Addendum)
Second late entry  Please see note charted per Doroteo Glassman regarding updated phone call 12/22  Late entry  Linda Mejia contacted the clinic to update her quit date as it was initially documented incorrectly. On original questioning the patient quit date as reported at 2007 but Linda Mejia corrected this late to 10/2004. This Mejia be corrected in her chart to refect the appropriate quit date. Also this Mejia place Linda Mejia out of the window for the LDCT screening protocol as her quit date surpasses the 15 year mark.   Virtual Visit via Video Note  I connected with Linda Mejia on 02/26/21 at  2:00 PM EST by telephone and verified that I am speaking with the correct person using two identifiers.  Location: Patient: Home Provider: Working from home   I discussed the limitations, risks, security and privacy concerns of performing an evaluation and management service by telephone and the availability of in person appointments. I also discussed with the patient that there may be a patient responsible charge related to this service. The patient expressed understanding and agreed to proceed.  Shared Decision Making Visit Lung Cancer Screening Program [phone])   Eligibility: Age 65 y.o. Pack Years Smoking History Calculation 35 (# packs/per year x # years smoked) Recent History of coughing up blood  no Unexplained weight loss? no ( >Than 15 pounds within the last 6 months ) Prior History Lung / other cancer no (Diagnosis within the last 5 years already requiring surveillance chest CT Scans). Smoking Status Former Smoker Former Smokers: Years since quit: 5 years  Quit Date: 2017  Visit Components: Discussion included one or more decision making aids. yes Discussion included risk/benefits of screening. yes Discussion included potential follow up diagnostic testing for abnormal scans. yes Discussion included meaning and risk of over diagnosis. yes Discussion included meaning and risk of False  Positives. yes Discussion included meaning of total radiation exposure. yes  Counseling Included: Importance of adherence to annual lung cancer LDCT screening. yes Impact of comorbidities on ability to participate in the program. yes Ability and willingness to under diagnostic treatment. yes  Smoking Cessation Counseling: Current Smokers:  Discussed importance of smoking cessation. yes Information about tobacco cessation classes and interventions provided to patient. yes Patient provided with "ticket" for LDCT Scan. yes Symptomatic Patient. no  Counseling NA Diagnosis Code: Tobacco Use Z72.0 Asymptomatic Patient yes  Counseling (Intermediate counseling: > three minutes counseling) [id number] Former Smokers:  Discussed the importance of maintaining cigarette abstinence. yes Diagnosis Code: Personal History of Nicotine Dependence. D98.338 Information about tobacco cessation classes and interventions provided to patient. Yes Patient provided with "ticket" for LDCT Scan. yes Written Order for Lung Cancer Screening with LDCT placed in Epic. Yes (CT Chest Lung Cancer Screening Low Dose W/O CM) [id number] Z12.2-Screening of respiratory organs Z87.891-Personal history of nicotine dependence   I spent 25 minutes of face to face time with her discussing the risks and benefits of lung cancer screening. We viewed a power point together that explained in detail the above noted topics. We took the time to pause the power point at intervals to allow for questions to be asked and answered to ensure understanding. We discussed that she had taken the single most powerful action possible to decrease her risk of developing lung cancer when she quit smoking. I counseled her to remain smoke free, and to contact me if she ever had the desire to smoke again so that I can provide resources and tools to help support  the effort to remain smoke free. We discussed the time and location of the scan, and that either  Doroteo Glassman RN or I Mejia call with the results within  24-48 hours of receiving them. She has my card and contact information in the event she needs to speak with me, in addition to a copy of the power point we reviewed as a resource. She verbalized understanding of all of the above and had no further questions upon leaving the office.     I explained to the patient that there has been a high incidence of coronary artery disease noted on these exams. I explained that this is a non-gated exam therefore degree or severity cannot be determined. This patient is on statin therapy. I have asked the patient to follow-up with their PCP regarding any incidental finding of coronary artery disease and management with diet or medication as they feel is clinically indicated. The patient verbalized understanding of the above and had no further questions.  Tabria Steines D. Kenton Kingfisher, NP-C [facility] Personal contact information can be found on Amion  03/29/2021, 2:22 PM

## 2021-03-29 NOTE — Patient Instructions (Signed)
Thank you for participating in the [facility]. °It was our pleasure to meet you today. °We will call you with the results of your scan within the next few days. °Your scan will be assigned a Lung RADS category score by the physicians reading the scans.  °This Lung RADS score determines follow up scanning.  °See below for description of categories, and follow up screening recommendations. °We will be in touch to schedule your follow up screening annually or based on recommendations of our providers. °We will fax a copy of your scan results to your Primary Care Physician, or the physician who referred you to the program, to ensure they have the results. °Please call the office if you have any questions or concerns regarding your scanning experience or results.  °Our office number is [phone]. °Please speak with Denise Phelps, RN. She is our Lung Cancer Screening RN. °If she is unavailable when you call, please have the office staff send her a message. She will return your call at her earliest convenience. °Remember, if your scan is normal, we will scan you annually as long as you continue to meet the criteria for the program. (Age 55-77, Current smoker or smoker who has quit within the last 15 years). °If you are a smoker, remember, quitting is the single most powerful action that you can take to decrease your risk of lung cancer and other pulmonary, breathing related problems. °We know quitting is hard, and we are here to help.  °Please let us know if there is anything we can do to help you meet your goal of quitting. °If you are a former smoker, congratulations. We are proud of you! Remain smoke free! °Remember you can refer friends or family members through the number above.  °We will screen them to make sure they meet criteria for the program. °Thank you for helping us take better care of you by participating in Lung Screening. ° °You can receive free nicotine replacement therapy  ( patches, gum or mints) by calling 1-800-QUIT NOW. Please call so we can get you on the path to becoming  a non-smoker. I know it is hard, but you can do this! ° °Lung RADS Categories: ° °Lung RADS 1: no nodules or definitely non-concerning nodules.  °Recommendation is for a repeat annual scan in 12 months. ° °Lung RADS 2:  nodules that are non-concerning in appearance and behavior with a very low likelihood of becoming an active cancer. °Recommendation is for a repeat annual scan in 12 months. ° °Lung RADS 3: nodules that are probably non-concerning , includes nodules with a low likelihood of becoming an active cancer.  Recommendation is for a 6-month repeat screening scan. Often noted after an upper respiratory illness. We will be in touch to make sure you have no questions, and to schedule your 6-month scan. ° °Lung RADS 4 A: nodules with concerning findings, recommendation is most often for a follow up scan in 3 months or additional testing based on our provider's assessment of the scan. We will be in touch to make sure you have no questions and to schedule the recommended 3 month follow up scan. ° °Lung RADS 4 B:  indicates findings that are concerning. We will be in touch with you to schedule additional diagnostic testing based on our provider's  assessment of the scan. ° °Hypnosis for smoking cessation  °[employer] °[phone] ° °Acupuncture for smoking cessation  °[facility] °[phone]  °

## 2021-04-01 ENCOUNTER — Ambulatory Visit: Payer: Medicare Other

## 2021-04-02 NOTE — Telephone Encounter (Signed)
Called and spoke to pt. Pt states her smoking quit date was 10/2004. Will forward this to Gerald Leitz, NP, to correct the office note.    Also, pt states she had to cancel her LDCT chest but is having trouble rescheduling this. Will forward to [place] to help get this rescheduled.

## 2021-04-02 NOTE — Telephone Encounter (Signed)
If she quit in 10/2004 then she doesn't qualify for the LCS program that is 16 years since she quit by my calculations

## 2021-04-04 ENCOUNTER — Encounter: Payer: Self-pay | Admitting: Acute Care

## 2021-04-04 NOTE — Telephone Encounter (Signed)
I called and spoke with pt regarding her smoking history. Pt is pretty sure she quit somewhere between 2006 and 2007. She is unsure of the exact date but knows that it is somewhere between those two dates. Low dose CT has been rescheduled.

## 2021-04-08 ENCOUNTER — Other Ambulatory Visit: Payer: Self-pay

## 2021-04-08 ENCOUNTER — Encounter: Payer: Self-pay | Admitting: *Deleted

## 2021-04-08 ENCOUNTER — Emergency Department: Payer: Medicare Other

## 2021-04-08 ENCOUNTER — Emergency Department
Admission: EM | Admit: 2021-04-08 | Discharge: 2021-04-08 | Disposition: A | Discharge status: Home / Self Care | Payer: Medicare Other | Attending: Emergency Medicine | Admitting: Emergency Medicine

## 2021-04-08 DIAGNOSIS — R0602 Shortness of breath: Secondary | ICD-10-CM | POA: Diagnosis not present

## 2021-04-08 DIAGNOSIS — R079 Chest pain, unspecified: Secondary | ICD-10-CM | POA: Insufficient documentation

## 2021-04-08 DIAGNOSIS — Z5321 Procedure and treatment not carried out due to patient leaving prior to being seen by health care provider: Secondary | ICD-10-CM | POA: Diagnosis not present

## 2021-04-08 LAB — BASIC METABOLIC PANEL
Anion gap: 10 (ref 5–15)
BUN: 22 mg/dL (ref 8–23)
CO2: 29 mmol/L (ref 22–32)
Calcium: 8.8 mg/dL — ABNORMAL LOW (ref 8.9–10.3)
Chloride: 100 mmol/L (ref 98–111)
Creatinine, Ser: 0.97 mg/dL (ref 0.44–1.00)
GFR, Estimated: >60 mL/min (ref >60)
Glucose, Bld: 114 mg/dL — ABNORMAL HIGH (ref 70–99)
Potassium: 3.9 mmol/L (ref 3.5–5.1)
Sodium: 139 mmol/L (ref 135–145)

## 2021-04-08 LAB — TROPONIN I (HIGH SENSITIVITY)
Troponin I (High Sensitivity): 5 ng/L (ref <18)
Troponin I (High Sensitivity): 6 ng/L (ref <18)

## 2021-04-08 LAB — CBC
HCT: 37.8 % (ref 36.0–46.0)
Hemoglobin: 12.2 g/dL (ref 12.0–15.0)
MCH: 31.6 pg (ref 26.0–34.0)
MCHC: 32.3 g/dL (ref 30.0–36.0)
MCV: 97.9 fL (ref 80.0–100.0)
Platelets: 314 10*3/uL (ref 150–400)
RBC: 3.86 MIL/uL — ABNORMAL LOW (ref 3.87–5.11)
RDW: 13.2 % (ref 11.5–15.5)
WBC: 7.5 10*3/uL (ref 4.0–10.5)
nRBC: 0 % (ref 0.0–0.2)

## 2021-04-08 NOTE — ED Triage Notes (Signed)
Pt presents w/ chest pain and shortness of breath since 1100 today. Pt experienced n/v at the same time. Pt continues to vomit. Pt also c/o constipation 2-3 days. Pt took 4 doses of laxative today w/o relief.

## 2021-04-12 ENCOUNTER — Ambulatory Visit: Payer: Medicare Other

## 2021-05-01 ENCOUNTER — Ambulatory Visit: Payer: Medicare Other | Admitting: Podiatry

## 2021-05-06 ENCOUNTER — Ambulatory Visit
Admission: RE | Admit: 2021-05-06 | Discharge: 2021-05-06 | Disposition: A | Discharge status: Home / Self Care | Payer: Medicare Other | Source: Ambulatory Visit | Attending: Acute Care | Admitting: Acute Care

## 2021-05-06 DIAGNOSIS — Z87891 Personal history of nicotine dependence: Secondary | ICD-10-CM

## 2021-05-07 ENCOUNTER — Telehealth: Payer: Self-pay | Admitting: Acute Care

## 2021-05-08 NOTE — Telephone Encounter (Signed)
Spoke with patient to review LDCT results.  She received her letter and had a few questions.  Reviewed lung scan results showing no areas of concern for the lungs.  Patient asked about PAH and Atherosclerosis.  She recently had stents placed by cardiology and has a follow soon with the provider and will discuss these findings more at that visit.  Patient acknowledged understanding that arterial calcifications and is on statin therapy. Discussed the [place] and best resource for evaluation is with cardiologist.  Patient had no further questions.

## 2021-05-08 NOTE — Telephone Encounter (Signed)
Spoke to patient, who is requesting CT results.  Judson Roch and Langley Gauss, please advise. Thanks

## 2021-05-22 ENCOUNTER — Ambulatory Visit: Payer: Medicare Other | Admitting: Podiatry

## 2021-05-22 ENCOUNTER — Encounter: Payer: Self-pay | Admitting: Podiatry

## 2021-05-22 ENCOUNTER — Other Ambulatory Visit (INDEPENDENT_AMBULATORY_CARE_PROVIDER_SITE_OTHER): Payer: Self-pay | Admitting: Vascular Surgery

## 2021-05-22 DIAGNOSIS — I739 Peripheral vascular disease, unspecified: Secondary | ICD-10-CM

## 2021-05-22 DIAGNOSIS — M79604 Pain in right leg: Secondary | ICD-10-CM

## 2021-05-22 NOTE — Telephone Encounter (Signed)
Scheduled pt 8am Monday with Dr Milinda Pointer

## 2021-05-23 ENCOUNTER — Ambulatory Visit (INDEPENDENT_AMBULATORY_CARE_PROVIDER_SITE_OTHER): Payer: Medicare Other | Admitting: Nurse Practitioner

## 2021-05-23 ENCOUNTER — Other Ambulatory Visit: Payer: Self-pay

## 2021-05-23 ENCOUNTER — Encounter (INDEPENDENT_AMBULATORY_CARE_PROVIDER_SITE_OTHER): Payer: Self-pay | Admitting: Nurse Practitioner

## 2021-05-23 ENCOUNTER — Ambulatory Visit (INDEPENDENT_AMBULATORY_CARE_PROVIDER_SITE_OTHER): Payer: Medicare Other

## 2021-05-23 VITALS — BP 154/83 | HR 73 | Resp 16 | Wt 212.0 lb

## 2021-05-23 DIAGNOSIS — R1031 Right lower quadrant pain: Secondary | ICD-10-CM

## 2021-05-23 DIAGNOSIS — M79604 Pain in right leg: Secondary | ICD-10-CM

## 2021-05-23 DIAGNOSIS — M79605 Pain in left leg: Secondary | ICD-10-CM

## 2021-05-23 DIAGNOSIS — I739 Peripheral vascular disease, unspecified: Secondary | ICD-10-CM | POA: Diagnosis not present

## 2021-05-23 DIAGNOSIS — M2550 Pain in unspecified joint: Secondary | ICD-10-CM

## 2021-05-27 ENCOUNTER — Other Ambulatory Visit: Payer: Self-pay

## 2021-05-27 ENCOUNTER — Encounter: Payer: Self-pay | Admitting: Podiatry

## 2021-05-27 ENCOUNTER — Ambulatory Visit (INDEPENDENT_AMBULATORY_CARE_PROVIDER_SITE_OTHER): Payer: Medicare Other | Admitting: Podiatry

## 2021-05-27 DIAGNOSIS — M19072 Primary osteoarthritis, left ankle and foot: Secondary | ICD-10-CM

## 2021-05-27 DIAGNOSIS — S93692A Other sprain of left foot, initial encounter: Secondary | ICD-10-CM

## 2021-05-27 MED ORDER — GABAPENTIN 300 MG PO CAPS: 300.0000 mg | ORAL_CAPSULE | Freq: Three times a day (TID) | ORAL | 3 refills | Status: DC

## 2021-05-27 NOTE — Progress Notes (Signed)
She presents today for follow-up of pain to her left foot.  She states that it tingles the left foot feels cold and the big toe is numb she states that that pain shoots up from the outside she states that she can hardly do anything at all and is starting to affect her family life.  She also goes on to say that she does not want any more injections.  Objective: Vital signs are stable she is alert and oriented x3 she has pain on palpation medial calcaneal tubercle and some degree proximal to the dorsum of the foot as well as the central arch or plantar fibroma is present.  She states that the plantar fibroma is less painful than it was previously.  Assessment: Cannot rule out further tearing of the plantar fascia from the previous EPF.  Also cannot rule out osteoarthritic change.  Plan: Currently secondary to failed attempts were requesting an MRI of this left rear foot to rule out internal derangement i.e. tears and osteoarthritic change.  If this does not yield enough information nerve conduction velocity exam may be necessary as she does relate history of back pain.  I also started her on gabapentin 300 mg 3 times a day she will start out at night initially and increase over the next 2 to 3 weeks to 3 times a day

## 2021-06-02 ENCOUNTER — Encounter (INDEPENDENT_AMBULATORY_CARE_PROVIDER_SITE_OTHER): Payer: Self-pay | Admitting: Nurse Practitioner

## 2021-06-02 NOTE — Progress Notes (Signed)
Subjective:    Patient ID: Linda Mejia, female    DOB: December 07, 1955, 66 y.o.   MRN: [id number] Chief Complaint  Patient presents with   New Patient (Initial Visit)    Ref [place] abi consult,PVD,right groin & bilateral leg pain    Linda Mejia is a 66 year old female that presents today for evaluation of ongoing leg pain and right groin pain.  The patient notes that the pain is largely in her right lower extremity not her left.  However referral notes indicate that at the time of referral she was having bilateral foot pain.  She currently denies having any numbness, tingling, burning or sensations consistent with neuropathy.  She denies any classic claudication-like symptoms or rest pain.  She does note that the groin pain is worse when she moves from a sitting to standing position and tries to begin walking.  It was also observed that when trying to tie her shoe she had severe difficulty with the range of motion in her right leg area.  The patient was recently seen hospital due to a recent MI.  Today noninvasive studies show an ABI of 1.11 on the right and 1.04 on the left.  The patient has biphasic waveforms in the anterior tibial artery with triphasic posterior tibial artery.  The patient has normal toe waveforms on the right.  She has triphasic tibial artery waveforms on the left with normal toe waveforms.  She has a TBI of 0.89 on the right and 1.00 on the left.   Review of Systems  Cardiovascular:  Negative for leg swelling.  Musculoskeletal:  Positive for arthralgias. Negative for joint swelling.  All other systems reviewed and are negative.     Objective:   Physical Exam Vitals reviewed.  HENT:     Head: Normocephalic.  Cardiovascular:     Rate and Rhythm: Normal rate.     Pulses:          Dorsalis pedis pulses are 1+ on the right side and 1+ on the left side.       Posterior tibial pulses are 1+ on the right side and 1+ on the left side.  Pulmonary:     Effort: Pulmonary effort is  normal.  Skin:    General: Skin is warm and dry.  Neurological:     Mental Status: She is alert and oriented to person, place, and time.  Psychiatric:        Mood and Affect: Mood normal.        Behavior: Behavior normal.        Thought Content: Thought content normal.        Judgment: Judgment normal.    BP (!) 154/83 (BP Location: Left Arm)    Pulse 73    Resp 16    Wt 212 lb (96.2 kg)    BMI 35.28 kg/m   Past Medical History:  Diagnosis Date   Abnormal ankle brachial index (ABI) 12/02/2016   ADHD (attention deficit hyperactivity disorder)    AKI (acute kidney injury) ([place])    Allergy 2020   Cant take amlopine. Causes swelling   Anxiety    Aortic atherosclerosis ([place]) 12/02/2016   July 2018   Arthritis    Chronic back pain    Coronary artery disease    Depression 2020   I think everyone is depressed at this unprecedented time!   Encounter for HIV (human immunodeficiency virus) test    Essential (hemorrhagic) thrombocythemia ([place]) 05/08/2020   GERD (gastroesophageal  reflux disease)    Herpes genitalis in women    History of stomach ulcers    x7 this year. 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[place]), left (HCC)    Subclinical hypothyroidism    Subclinical hypothyroidism    Ulcer     Social History   Socioeconomic History   Marital status: Divorced    Spouse name: Not on file   Number of children: 2   Years of education: Not on file   Highest education level: GED or equivalent  Occupational History   Occupation: disabled  Tobacco Use   Smoking status: Former    Packs/day: 1.00    Years: 35.00    Pack years: 35.00    Types: Cigarettes    Quit date: 10/2004    Years since quitting: 16.6   Smokeless tobacco: Never   Tobacco comments:    I quit smoking cigarettes  Vaping Use   Vaping Use: Never used   Substance and Sexual Activity   Alcohol use: Yes    Alcohol/week: 1.0 standard drink    Types: 1 Glasses of wine per week    Comment: occasionally    Drug use: No   Sexual activity: Not Currently    Birth control/protection: Post-menopausal, None  Other Topics Concern   Not on file  Social History Narrative   Not on file   Social Determinants of Health   Financial Resource Strain: Low Risk    Difficulty of Paying Living Expenses: Not very hard  Food Insecurity: No Food Insecurity   Worried About Charity fundraiser in the Last Year: Never true   Ran Out of Food in the Last Year: Never true  Transportation Needs: No Transportation Needs   Lack of Transportation (Medical): No   Lack of Transportation (Non-Medical): No  Physical Activity: Inactive   Days of Exercise per Week: 0 days   Minutes of Exercise per Session: 0 min  Stress: No Stress Concern Present   Feeling of Stress : Only a little  Social Connections: Moderately Isolated   Frequency of Communication with Friends and Family: More than three times a week   Frequency of Social Gatherings with Friends and Family: Once a week   Attends Religious Services: More than 4 times per year   Active Member of [employer] or Organizations: No   Attends Archivist Meetings: Never   Marital Status: Widowed  Human resources officer Violence: Not At Risk   Fear of Current or Ex-Partner: No   Emotionally Abused: No   Physically Abused: No   Sexually Abused: No    Past Surgical History:  Procedure Laterality Date   APPENDECTOMY     CARDIAC CATHETERIZATION     CESAREAN SECTION     x2   COLON SURGERY     interception   COLONOSCOPY WITH PROPOFOL N/A 08/17/2020   Procedure: COLONOSCOPY WITH PROPOFOL;  Surgeon: Virgel Manifold, MD;  Location: [facility];  Service: Endoscopy;  Laterality: N/A;   CORONARY ANGIOPLASTY     stents...  2000   ESOPHAGOGASTRODUODENOSCOPY (EGD) WITH PROPOFOL N/A 09/21/2015   Procedure:  ESOPHAGOGASTRODUODENOSCOPY (EGD) WITH PROPOFOL;  Surgeon: Lucilla Lame, MD;  Location: [facility];  Service: Endoscopy;  Laterality: N/A;   ESOPHAGOGASTRODUODENOSCOPY (EGD) WITH PROPOFOL N/A 07/30/2016   Procedure: ESOPHAGOGASTRODUODENOSCOPY (EGD) WITH PROPOFOL;  Surgeon: Jonathon Bellows,  MD;  Location: [facility] ENDOS[facility];  Service: Endoscopy;  Laterality: N/A;   ESOPHAGOGASTRODUODENOSCOPY (EGD) WITH PROPOFOL N/A 04/23/2018   Procedure: ESOPHAGOGASTRODUODENOSCOPY (EGD) WITH PROPOFOL;  Surgeon: Virgel Manifold, MD;  Location: [facility];  Service: Endoscopy;  Laterality: N/A;   ESOPHAGOGASTRODUODENOSCOPY (EGD) WITH PROPOFOL N/A 11/04/2019   Procedure: ESOPHAGOGASTRODUODENOSCOPY (EGD) WITH PROPOFOL;  Surgeon: Lin Landsman, MD;  Location: [facility] END[facility]COPY;  Service: Gastroenterology;  Laterality: N/A;   EYE SURGERY  Dec 2021   Droopy eye lids   LEFT HEART CATH Right 01/25/2018   Procedure: Left Heart Cath and Coronary Angiography;  Surgeon: Dionisio David, MD;  Location: [place] [facility];  Service: Cardiovascular;  Laterality: Right;   LEFT HEART CATH AND CORONARY ANGIOGRAPHY Right 08/12/2016   Procedure: Left Heart Cath and Coronary Angiography;  Surgeon: Dionisio David, MD;  Location: [place] [facility];  Service: Cardiovascular;  Laterality: Right;   SMALL INTESTINE SURGERY     TONSILLECTOMY     TUBAL LIGATION      Family History  Problem Relation Age of Onset   Diabetes Mother    CVA Mother    Heart disease Father    Heart disease Sister    Breast cancer Sister    Heart disease Brother    Heart disease Sister    Heart disease Sister    Heart disease Sister    Heart disease Brother    Heart disease Brother    Heart disease Brother    Heart disease Brother    Healthy Daughter    Healthy Daughter     Allergies  Allergen Reactions   Amlodipine     Leg swelling   Statins Other (See Comments)    CBC Latest Ref Rng & Units 04/08/2021 11/25/2020 10/20/2020  WBC 4.0 -  10.5 K/uL 7.5 7.9 7.2  Hemoglobin 12.0 - 15.0 g/dL 12.2 12.9 14.0  Hematocrit 36.0 - 46.0 % 37.8 39.1 42.9  Platelets 150 - 400 K/uL 314 280 323      CMP     Component Value Date/Time   NA 139 04/08/2021 2023   NA 144 06/05/2015 1203   NA 142 08/03/2014 2009   K 3.9 04/08/2021 2023   K 3.6 08/03/2014 2009   CL 100 04/08/2021 2023   CL 108 08/03/2014 2009   CO2 29 04/08/2021 2023   CO2 27 08/03/2014 2009   GLUCOSE 114 (H) 04/08/2021 2023   GLUCOSE 102 (H) 08/03/2014 2009   BUN 22 04/08/2021 2023   BUN 13 06/05/2015 1203   BUN 15 08/03/2014 2009   CREATININE 0.97 04/08/2021 2023   CREATININE 0.78 12/06/2018 1406   CALCIUM 8.8 (L) 04/08/2021 2023   CALCIUM 8.3 (L) 08/03/2014 2009   PROT 7.3 11/25/2020 1815   PROT 6.9 08/03/2014 2009   ALBUMIN 3.9 11/25/2020 1815   ALBUMIN 3.9 08/03/2014 2009   AST 24 11/25/2020 1815   AST 24 08/03/2014 2009   ALT 16 11/25/2020 1815   ALT 12 (L) 08/03/2014 2009   ALKPHOS 71 11/25/2020 1815   ALKPHOS 63 08/03/2014 2009   BILITOT 0.6 11/25/2020 1815   BILITOT <0.1 (L) 08/03/2014 2009   GFRNONAA >60 04/08/2021 2023   GFRNONAA 81 12/06/2018 1406   GFRAA >60 12/12/2019 1619   GFRAA 94 12/06/2018 1406     VAS [place] ABI WITH/WO TBI  Result Date: 05/30/2021  LOWER EXTREMITY DOPPLER STUDY Patient Name:  KELANI ROBART  Date of Exam:   05/23/2021 Medical Rec #: [id number]  Accession #:    [id number] Date of Birth: 1956-03-08    Patient Gender: F Patient Age:   52 years Exam Location:  [facility] Procedure:      VAS [place] ABI WITH/WO TBI Referring Phys: [facility] --------------------------------------------------------------------------------  Indications: Rest pain, peripheral artery disease, and Rt Groin pain.  Performing Technologist: Almira Coaster RVS  Examination Guidelines: A complete evaluation includes at minimum, Doppler waveform signals and systolic blood pressure reading at the level of bilateral brachial, anterior tibial, and  posterior tibial arteries, when vessel segments are accessible. Bilateral testing is considered an integral part of a complete examination. Photoelectric Plethysmograph (PPG) waveforms and toe systolic pressure readings are included as required and additional duplex testing as needed. Limited examinations for reoccurring indications may be performed as noted.  ABI Findings: +---------+------------------+-----+---------+--------+  Right     Rt Pressure (mmHg) Index Waveform  Comment   +---------+------------------+-----+---------+--------+  Brachial  159                                          +---------+------------------+-----+---------+--------+  ATA       167                1.04  biphasic            +---------+------------------+-----+---------+--------+  PTA       177                1.11  triphasic           +---------+------------------+-----+---------+--------+  Great Toe 142                0.89  Normal              +---------+------------------+-----+---------+--------+ +---------+------------------+-----+---------+-------+  Left      Lt Pressure (mmHg) Index Waveform  Comment  +---------+------------------+-----+---------+-------+  Brachial  160                                         +---------+------------------+-----+---------+-------+  ATA       149                0.93  triphasic          +---------+------------------+-----+---------+-------+  PTA       166                1.04  triphasic          +---------+------------------+-----+---------+-------+  Great Toe 160                1.00  Normal             +---------+------------------+-----+---------+-------+  Summary: Right: Resting right ankle-brachial index is within normal range. No evidence of significant right lower extremity arterial disease. The right toe-brachial index is normal. Imaged and acquired Waveforms of the Right PTA,ATA and Peroneal Artery. Flow seen in Ankle level Vessels but Very Minimal in the Rt ATA. Left: Resting left  ankle-brachial index is within normal range. No evidence of significant left lower extremity arterial disease. The left toe-brachial index is normal. Waveforms and Imaging obtained in the Left PTA,ATA and Peroneal Artery; Normal Flow seen.  *See table(s) above for measurements and observations.  Electronically signed by Hortencia Pilar MD on 05/30/2021 at 3:52:39 PM.    Final  Assessment & Mejia:   1. Right inguinal pain In physical exam today it is noted that the patient has difficulty with movement and full range of motion of her right hip.  This is likely the source of her ongoing inguinal pain versus vascular issues.    2. Polyarthralgia I suspect that this is a large cause of the patient's groin pain.  The patient has decreased range of motion in that right hip area.  Patient is recommended to follow with PCP for further evaluation of hip and her back issues.  3. Bilateral leg pain The patient does have evidence of mild tibial level PAD in her right lower extremity.  This does not account for any of the pain that she is experiencing in her left lower extremity.  The patient does have chronic reflux studies which will help to evaluate for possible venous insufficiency causing heaviness.  Overall no intervention is indicated at this time but we will continue with annual follow-up for PAD.    Current Outpatient Medications on File Prior to Visit  Medication Sig Dispense Refill   acyclovir (ZOVIRAX) 400 MG tablet Take 1 tablet (400 mg total) by mouth 2 (two) times daily. 60 tablet 5   amphetamine-dextroamphetamine (ADDERALL) 30 MG tablet Take 30 mg by mouth 2 (two) times daily.     aspirin EC 81 MG tablet Take 81 mg by mouth daily.     BRILINTA 90 MG TABS tablet Take by mouth.     clopidogrel (PLAVIX) 75 MG tablet Take 75 mg by mouth daily.     diazepam (VALIUM) 10 MG tablet Take 10 mg by mouth 3 (three) times daily as needed.     fluticasone (FLONASE) 50 MCG/ACT nasal spray Place 2  sprays into both nostrils daily as needed for allergies. 16 g 11   losartan-hydrochlorothiazide (HYZAAR) 50-12.5 MG tablet Take 1 tablet by mouth daily.     metoprolol tartrate (LOPRESSOR) 50 MG tablet Take 1 tablet (50 mg total) by mouth 2 (two) times daily. 60 tablet 5   nitroGLYCERIN (NITROSTAT) 0.4 MG SL tablet Place under the tongue.     NYSTATIN powder APPLY TO THE AFFECTED AREA ON SKIN TWICE DAILY 60 g 0   PRALUENT 150 MG/ML SOAJ Inject into the skin.     PROAIR RESPICLICK 458 (90 Base) MCG/ACT AEPB Inhale 2 puffs into the lungs every 6 (six) hours as needed.     No current facility-administered medications on file prior to visit.    There are no Patient Instructions on file for this visit. No follow-ups on file.   Kris Hartmann, NP

## 2021-06-03 ENCOUNTER — Encounter: Payer: Self-pay | Admitting: Podiatry

## 2021-06-05 ENCOUNTER — Encounter: Payer: Self-pay | Admitting: Podiatry

## 2021-06-05 ENCOUNTER — Other Ambulatory Visit: Payer: Self-pay

## 2021-06-05 ENCOUNTER — Ambulatory Visit (INDEPENDENT_AMBULATORY_CARE_PROVIDER_SITE_OTHER): Payer: Medicare Other | Admitting: Podiatry

## 2021-06-05 DIAGNOSIS — M722 Plantar fascial fibromatosis: Secondary | ICD-10-CM

## 2021-06-05 NOTE — Progress Notes (Signed)
She presents today for a follow-up of her painful foot.  She states that her foot is getting worse.  She states that anytime she touches it to the forts exquisitely tender.  She states that that her pain in her foot is causing her breath and her family.  She had an central and lateral bands.  Done at [facility] rather than having one done here or I can read it.  She had one done there were unable to have access to her films.  She really does relate a history of heart attack and is now on blood thinners.  Objective: Vital signs are stable alert and oriented.  The majority of her pain is located in the plantar heel left.  It appears to be central band.  Assessment: Per MRI she has chronic inflammation of the central lateral band.  Total fasciotomy with PRP left foot she will need to be off of her blood thinners and we will talk to Dr. Towanda Malkin for that.  We consented her today making sure that she understands that there is no guarantee this will work for her.  She states that she would like to try something even if it does not work.

## 2021-06-06 ENCOUNTER — Encounter: Payer: Self-pay | Admitting: Podiatry

## 2021-06-11 ENCOUNTER — Telehealth: Payer: Self-pay | Admitting: Urology

## 2021-06-11 NOTE — Telephone Encounter (Signed)
DOS - 07/12/21  EPF LEFT --- 73403 PRP  [employer] EFFECTIVE DATE - 04/14/21  PLAN DEDUCTIBLE  - $0.00 OUT OF POCKET - $8,300.00 W/ $8,278.50 REMAINING COINSURANCE - 20% COPAY - $0.00  PER [employer] WEBSITE FOR CPT CODE 70964 Notification or Prior Authorization is not required for the requested services  Decision ID #:[id number]

## 2021-06-17 ENCOUNTER — Other Ambulatory Visit: Payer: Self-pay

## 2021-06-17 ENCOUNTER — Ambulatory Visit
Admission: RE | Admit: 2021-06-17 | Discharge: 2021-06-17 | Disposition: A | Discharge status: Home / Self Care | Payer: Medicare Other | Source: Ambulatory Visit | Attending: Podiatry | Admitting: Podiatry

## 2021-06-17 DIAGNOSIS — S93692A Other sprain of left foot, initial encounter: Secondary | ICD-10-CM

## 2021-06-17 DIAGNOSIS — M19072 Primary osteoarthritis, left ankle and foot: Secondary | ICD-10-CM

## 2021-06-18 ENCOUNTER — Telehealth: Payer: Self-pay | Admitting: *Deleted

## 2021-06-18 NOTE — Telephone Encounter (Signed)
-----   Message from Garrel Ridgel, [place] sent at 06/18/2021  7:24 AM EST ----- ?Caryl Pina to let her know that her MRI demonstrated exactly what we thought it would she does have Planter fasciitis of the lateral band and it does show up where the medial band had been released previously without surgery.  So I think the total fasciotomy is what she needs.  As long as Dr. Wilburn Cornelia will allow her to come off of her blood thinners I think will be fine with the procedure planned. ?

## 2021-06-25 ENCOUNTER — Telehealth: Payer: Self-pay

## 2021-06-25 NOTE — Telephone Encounter (Signed)
Received cardiac clearance from Dr. Tilda Burrow for surgery on 07/12/2021 with Dr. Milinda Pointer. Dr. Tilda Burrow recommended Bertram Millard to stop Aspirin and Brillimta 5 days prior to surgery then start back 2 days after. I notified Kahlyn today and she stated she understood. ?

## 2021-06-27 ENCOUNTER — Ambulatory Visit (INDEPENDENT_AMBULATORY_CARE_PROVIDER_SITE_OTHER): Payer: Medicare Other | Admitting: Vascular Surgery

## 2021-06-27 ENCOUNTER — Encounter (INDEPENDENT_AMBULATORY_CARE_PROVIDER_SITE_OTHER): Payer: Medicare Other

## 2021-07-02 ENCOUNTER — Telehealth: Payer: Self-pay | Admitting: Podiatry

## 2021-07-02 NOTE — Telephone Encounter (Signed)
Pt called specifically asking for you to give her a call back after clinic. Please advise.  ?

## 2021-07-03 ENCOUNTER — Telehealth: Payer: Self-pay | Admitting: *Deleted

## 2021-07-03 NOTE — Telephone Encounter (Signed)
"  I have been trying to get my medical records straightened out for months now.  There's a diagnosis in there that says I have substance abuse.  I am not a substance abuser.  I don't want Dr. Milinda Pointer thinking bad of me.  I don't want no one thinking that if I have surgery that they will not be able to give me the needed treatment or medication.  It just upsets me that that is in my records.  [place] put that misinformation in my chart and I've been trying to get it out."  I can come in to see him and talk to him about this."  An appointment is not needed.  I will let him know.  "I'd appreciate it." ?

## 2021-07-04 NOTE — Telephone Encounter (Signed)
Patient had questions regarding notes from [facility] - answered questions for her. ?

## 2021-07-10 ENCOUNTER — Other Ambulatory Visit: Payer: Self-pay | Admitting: Podiatry

## 2021-07-10 MED ORDER — OXYCODONE-ACETAMINOPHEN 10-325 MG PO TABS
1.0000 | ORAL_TABLET | Freq: Three times a day (TID) | ORAL | 0 refills | Status: AC | PRN
Start: 2021-07-10 — End: 2021-07-17

## 2021-07-10 MED ORDER — ONDANSETRON HCL 4 MG PO TABS: 4.0000 mg | ORAL_TABLET | Freq: Three times a day (TID) | ORAL | 0 refills | Status: DC | PRN

## 2021-07-10 MED ORDER — CEPHALEXIN 500 MG PO CAPS: 500.0000 mg | ORAL_CAPSULE | Freq: Three times a day (TID) | ORAL | 0 refills | Status: DC

## 2021-07-12 DIAGNOSIS — M722 Plantar fascial fibromatosis: Secondary | ICD-10-CM | POA: Diagnosis not present

## 2021-07-17 ENCOUNTER — Ambulatory Visit (INDEPENDENT_AMBULATORY_CARE_PROVIDER_SITE_OTHER): Payer: Medicare Other | Admitting: Podiatry

## 2021-07-17 ENCOUNTER — Encounter: Payer: Self-pay | Admitting: Podiatry

## 2021-07-17 DIAGNOSIS — Z9889 Other specified postprocedural states: Secondary | ICD-10-CM

## 2021-07-17 DIAGNOSIS — S93692A Other sprain of left foot, initial encounter: Secondary | ICD-10-CM

## 2021-07-17 NOTE — Progress Notes (Signed)
She presents today for follow-up of her total endoscopic plantar fasciotomy with PRP injection of the her heel.  Date of surgery was 07/12/2021. ? ?Objective: Vital signs are stable alert oriented x3 dressed her dressing intact presents with her cam walker.  Once all was removed demonstrates sutures are intact margins well coapted no pain on palpation of the heel. ? ?Assessment: Resolving Planter fasciitis.  Status post endoscopic plantar fasciotomy. ? ?Plan: Discussed etiology pathology and surgical therapies.  Redressed today with light dressing and compression bandage put her back in a cam walker which she will remain 24 hours a day 7 days a week until next week where we will remove the sutures.  May need to keep her in the cam boot at that time as well. ?

## 2021-07-24 ENCOUNTER — Ambulatory Visit (INDEPENDENT_AMBULATORY_CARE_PROVIDER_SITE_OTHER): Payer: Medicare Other | Admitting: Podiatry

## 2021-07-24 ENCOUNTER — Encounter: Payer: Self-pay | Admitting: Podiatry

## 2021-07-24 DIAGNOSIS — Z9889 Other specified postprocedural states: Secondary | ICD-10-CM

## 2021-07-24 DIAGNOSIS — S93692A Other sprain of left foot, initial encounter: Secondary | ICD-10-CM

## 2021-07-24 NOTE — Progress Notes (Signed)
She presents today for her second postop visit date of surgery 07/12/2021 total endoscopic fasciotomy with PRP injection.  She states that is burning in the back of the heel and she is getting some numbness in the ball of her foot.  States the heel itself really does not hurt that badly. ? ?Objective: Presents today with her cam walker.  It appears that she has not even been out of it since we last saw her.  Band-Aids are still intact.  Once removed demonstrates sutures are intact once removed demonstrates margins well coapted.  No ecchymosis in the medial longitudinal arch no pain on palpation while distracting her with conversation ? ?Assessment: Well-healing surgical foot.  I think her other symptoms are associated with the use of the cam boot. ? ?Plan: Sutures removed today Band-Aids placed put her back in her cam boot follow-up with her in 1 week may consider removing the boot at that point. ?

## 2021-07-25 ENCOUNTER — Encounter: Payer: Self-pay | Admitting: Podiatry

## 2021-07-25 ENCOUNTER — Other Ambulatory Visit: Payer: Self-pay | Admitting: Podiatry

## 2021-07-25 MED ORDER — OXYCODONE-ACETAMINOPHEN 10-325 MG PO TABS: 1.0000 | ORAL_TABLET | Freq: Three times a day (TID) | ORAL | 0 refills | Status: AC | PRN

## 2021-07-30 ENCOUNTER — Encounter: Payer: Self-pay | Admitting: *Deleted

## 2021-07-30 ENCOUNTER — Other Ambulatory Visit: Payer: Self-pay | Admitting: Podiatry

## 2021-07-30 DIAGNOSIS — M5431 Sciatica, right side: Secondary | ICD-10-CM

## 2021-08-07 ENCOUNTER — Ambulatory Visit (INDEPENDENT_AMBULATORY_CARE_PROVIDER_SITE_OTHER): Payer: Medicare Other | Admitting: Podiatry

## 2021-08-07 DIAGNOSIS — S93692A Other sprain of left foot, initial encounter: Secondary | ICD-10-CM

## 2021-08-07 NOTE — Progress Notes (Signed)
She presents today date of surgery 07/12/2021 total endoscopic fasciotomy done with PRP injection left heel.  She states that is doing good and ready to get out of this boot if I can. ? ?Objective: Vital signs are stable she is alert and oriented x3 left foot still demonstrates well-healing incision site no erythema edema cellulitis drainage odor no ecchymosis.  She has no tenderness on palpation of the plantar aspect of the heel between the 2 ports. ? ?Assessment well-healing surgical foot. ? ?Plan: We will allow her to wear her tennis shoes during the daytime she is to wear the cam boot for another 3 weeks at nighttime.  She is not to walk barefoot at all.  I will follow-up with her around May 10. ?

## 2021-08-21 ENCOUNTER — Encounter: Payer: Medicare Other | Admitting: Podiatry

## 2021-08-26 ENCOUNTER — Encounter: Payer: Medicare Other | Admitting: Podiatry

## 2021-09-11 ENCOUNTER — Encounter: Payer: Medicare Other | Admitting: Podiatry

## 2021-09-30 ENCOUNTER — Ambulatory Visit (INDEPENDENT_AMBULATORY_CARE_PROVIDER_SITE_OTHER): Payer: Medicare Other | Admitting: Podiatry

## 2021-09-30 ENCOUNTER — Encounter: Payer: Self-pay | Admitting: Podiatry

## 2021-09-30 DIAGNOSIS — S93692A Other sprain of left foot, initial encounter: Secondary | ICD-10-CM

## 2021-09-30 DIAGNOSIS — Z9889 Other specified postprocedural states: Secondary | ICD-10-CM

## 2021-09-30 NOTE — Progress Notes (Signed)
She presents today date of surgery 07/12/2021 total endoscopic plantar fasciotomy left foot with PRP injection left heel.  States that is doing good she is very happy with the outcome thus far.  Denies fever chills nausea vomiting muscle aches pains calf pain back pain chest pain shortness of breath.  Objective: Vital signs are stable alert oriented x3 there is no erythema edema cellulitis drainage odor no pain on palpation of the left heel.  Appears to have gone on to heal uneventfully.  Assessment: Well-healing total endoscopic plantar fasciotomy.  Plan: Follow-up with me on an as-needed basis once again gave her information regarding appropriate shoe gear.

## 2021-11-06 ENCOUNTER — Encounter: Payer: Self-pay | Admitting: Nurse Practitioner

## 2021-11-06 ENCOUNTER — Ambulatory Visit: Payer: Self-pay | Admitting: Nurse Practitioner

## 2021-11-06 DIAGNOSIS — Z113 Encounter for screening for infections with a predominantly sexual mode of transmission: Secondary | ICD-10-CM

## 2021-11-06 DIAGNOSIS — B009 Herpesviral infection, unspecified: Secondary | ICD-10-CM

## 2021-11-06 LAB — WET PREP FOR TRICH, YEAST, CLUE
Trichomonas Exam: NEGATIVE
Yeast Exam: NEGATIVE

## 2021-11-06 MED ORDER — ACYCLOVIR 800 MG PO TABS: 800.0000 mg | ORAL_TABLET | Freq: Two times a day (BID) | ORAL | 0 refills | Status: AC

## 2021-11-06 NOTE — Progress Notes (Signed)
[facility] Department  STI clinic/screening visit [address] [place] [address] [phone]  Subjective:  Linda Mejia is a 66 y.o. female being seen today for an STI screening visit. The patient reports they do have symptoms.  Patient reports that they do not desire a pregnancy in the next year.   They reported they are not interested in discussing contraception today.  Patient is postmenopausal.    No LMP recorded. Patient is postmenopausal.   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 12/16/2018   ANA positive 09/22/2018   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preiser), left (HCC) 09/22/2018   Tenosynovitis of hand 09/22/2018   Stomach irritation    Pulmonary hypertension (HCC) 03/09/2018   Unstable angina (HCC) 01/23/2018   Coronary artery calcification seen on CT scan 12/02/2016   Aortic atherosclerosis (HCC) 12/02/2016   Abnormal ankle brachial index (ABI) 12/02/2016   Prediabetes 12/02/2016   Degenerative arthritis of knee, bilateral 12/27/2015   Class 2 severe obesity due to excess calories with serious comorbidity and body mass index (BMI) of 36.0 to 36.9 in adult (HCC) 12/27/2015   Reflux esophagitis    Polyarthralgia 06/05/2015   Subclinical hypothyroidism 03/26/2015   Herpes simplex type 2 infection 03/21/2015   Rhinitis, allergic 03/21/2015   ADHD (attention deficit hyperactivity disorder), inattentive type 09/20/2014   Episodic paroxysmal anxiety disorder  09/20/2014   History of gastric ulcer 09/20/2014   Substance abuse (HCC) 09/20/2014   HLD (hyperlipidemia) 10/03/2009   Coronary artery disease involving native coronary artery of native heart with angina pectoris (HCC) 10/02/2009   Hypertension goal BP (blood pressure) < 140/90 10/02/2009    Chief Complaint  Patient presents with   SEXUALLY TRANSMITTED DISEASE    Screening- patient stated she is having vaginal discomfort and itching     HPI  Patient reports to clinic today due to vaginal irritation and itching for at least 4 weeks.     Last HIV test per patient/review of record was 06/23/2020 Patient reports last pap was 08/22/2020.   Screening for MPX risk: Does the patient have an unexplained rash? No Is the patient MSM? No Does the patient endorse multiple sex partners or anonymous sex partners? No Did the patient have close or sexual contact with a person diagnosed with MPX? No Has the patient traveled outside the [place] where MPX is endemic? No Is there a high clinical suspicion for MPX-- evidenced by one of the following No  -Unlikely to be chickenpox  -Lymphadenopathy  -Rash that present in same phase of evolution on any given body part See flowsheet for further details and programmatic requirements.   Immunization history:  Immunization History  Administered Date(s) Administered   Influenza Inj Mdck Quad Pf 12/06/2018, 01/16/2021   Influenza,inj,Quad PF,6+ Mos 03/26/2015, 06/09/2016, 03/18/2017, 12/15/2017, 01/05/2020   PFIZER Comirnaty(Gray Top)Covid-19 Tri-Sucrose Vaccine 07/18/2019, 08/09/2019, 04/10/2020   PFIZER(Purple Top)SARS-COV-2 Vaccination 07/18/2019, 08/09/2019, 04/10/2020   Tdap 06/09/2016  The following portions of the patient's history were reviewed and updated as appropriate: allergies, current medications, past medical history, past social history, past surgical history and problem list.  Objective:  There were no vitals filed for this  visit.  Physical Exam Constitutional:      Appearance: Normal appearance.  HENT:     Head: Normocephalic. No abrasion, masses or laceration. Hair is normal.     Right Ear: External ear normal.     Left Ear: External ear normal.     Nose: Nose normal.     Mouth/Throat:     Lips: Pink.     Mouth: Mucous membranes are moist. No oral lesions.     Dentition: No dental caries.     Pharynx: No oropharyngeal exudate or posterior oropharyngeal erythema.     Tonsils: No tonsillar exudate or tonsillar abscesses.     Comments: Missing dentition Eyes:     General: Lids are normal.        Right eye: No discharge.        Left eye: No discharge.     Conjunctiva/sclera: Conjunctivae normal.     Right eye: No exudate.    Left eye: No exudate. Abdominal:     General: Abdomen is flat.     Palpations: Abdomen is soft.     Tenderness: There is no abdominal tenderness. There is no rebound.  Genitourinary:    Pubic Area: No rash or pubic lice.      Labia:        Right: No rash, tenderness, lesion or injury.        Left: No rash, tenderness, lesion or injury.      Vagina: Normal. No vaginal discharge, erythema or lesions.     Cervix: No cervical motion tenderness, discharge, lesion or erythema.     Uterus: Not enlarged and not tender.      Rectum: Normal.       Comments: Amount Discharge: small  Odor: No pH: less than 4.5 Adheres to vaginal wall: No Color: color of discharge matches the Tearsa Kowalewski swab   Tiny tender lesions noted to inner labia, tenderness noted into the vagina area.  Small tear noted to the entrance of the vagina.  Bright red area noted around the external os. Musculoskeletal:     Cervical back: Full passive range of motion without pain, normal range of motion and neck supple.  Lymphadenopathy:     Cervical: No cervical adenopathy.     Right cervical: No superficial, deep or posterior cervical adenopathy.    Left cervical: No superficial, deep or posterior cervical adenopathy.      Upper Body:     Right upper body: No supraclavicular, axillary or epitrochlear adenopathy.     Left upper body: No supraclavicular, axillary or epitrochlear adenopathy.     Lower Body: No right inguinal adenopathy. No left inguinal adenopathy.  Skin:    General: Skin is warm and dry.     Findings: No lesion or rash.  Neurological:     Mental Status: She is alert and oriented to person, place, and time.  Psychiatric:        Attention and Perception: Attention normal.        Mood and Affect: Mood normal.        Speech: Speech normal.        Behavior: Behavior normal. Behavior is cooperative.      Assessment and Plan:  SRIHITA PROTHEROE is a 66 y.o. female presenting to  the [facility] Department for STI screening  1. Screening for venereal disease -66 year old female in clinic for STD screening. -Patient accepted all screenings including vaginal CT/GC, wet prep, and declines bloodwork for HIV/RPR.  Patient meets criteria for HepB screening? No. Ordered? No - low risk Patient meets criteria for HepC screening? No. Ordered? No - low risk  Treat wet prep per standing order Discussed time line for State Lab results and that patient will be called with positive results and encouraged patient to call if she had not heard in 2 weeks.  Counseled to return or seek care for continued or worsening symptoms Recommended condom use with all sex  Patient is currently not using  contraception  to prevent pregnancy.    - WET PREP FOR TRICH, YEAST, CLUE - Chlamydia/Gonorrhea  Lab  2. Herpes -Patient states she has been taking suppressive therapy for Herpes.  Currently in an outbreak.  Patient instructed to stop suppressive therapy and take doses episodically for 5 days.  May resume suppressive therapy once episodic dose is complete.   - acyclovir (ZOVIRAX) 800 MG tablet; Take 1 tablet (800 mg total) by mouth 2 (two) times daily for 5 days.  Dispense: 10 tablet; Refill:  0   Total time spent: 30 minutes  Return if symptoms worsen or fail to improve.    Glenna Fellows, FNP

## 2021-11-06 NOTE — Progress Notes (Unsigned)
Pt here for STD screening.  Wet mount results reviewed, no treatment required per SO.  Condoms given.  Windle Guard, RN

## 2021-11-11 ENCOUNTER — Ambulatory Visit: Payer: Medicare Other

## 2021-12-11 ENCOUNTER — Other Ambulatory Visit: Payer: Self-pay | Admitting: Internal Medicine

## 2021-12-11 DIAGNOSIS — Z1231 Encounter for screening mammogram for malignant neoplasm of breast: Secondary | ICD-10-CM

## 2022-01-02 ENCOUNTER — Ambulatory Visit
Admission: RE | Admit: 2022-01-02 | Discharge: 2022-01-02 | Disposition: A | Discharge status: Home / Self Care | Payer: Medicare Other | Source: Ambulatory Visit | Attending: Internal Medicine | Admitting: Internal Medicine

## 2022-01-02 DIAGNOSIS — Z1231 Encounter for screening mammogram for malignant neoplasm of breast: Secondary | ICD-10-CM | POA: Insufficient documentation

## 2022-02-10 ENCOUNTER — Encounter (INDEPENDENT_AMBULATORY_CARE_PROVIDER_SITE_OTHER): Payer: Self-pay

## 2022-05-23 ENCOUNTER — Encounter (INDEPENDENT_AMBULATORY_CARE_PROVIDER_SITE_OTHER): Payer: Medicare Other

## 2022-05-23 ENCOUNTER — Ambulatory Visit (INDEPENDENT_AMBULATORY_CARE_PROVIDER_SITE_OTHER): Payer: 59 | Admitting: Nurse Practitioner

## 2022-06-27 ENCOUNTER — Other Ambulatory Visit: Payer: Self-pay | Admitting: Orthopedic Surgery

## 2022-06-27 DIAGNOSIS — M8700 Idiopathic aseptic necrosis of unspecified bone: Secondary | ICD-10-CM

## 2022-06-27 DIAGNOSIS — M19032 Primary osteoarthritis, left wrist: Secondary | ICD-10-CM

## 2022-12-22 ENCOUNTER — Ambulatory Visit (INDEPENDENT_AMBULATORY_CARE_PROVIDER_SITE_OTHER): Payer: 59 | Admitting: Podiatry

## 2022-12-22 DIAGNOSIS — M19072 Primary osteoarthritis, left ankle and foot: Secondary | ICD-10-CM | POA: Diagnosis not present

## 2022-12-22 MED ORDER — TRIAMCINOLONE ACETONIDE 40 MG/ML IJ SUSP
20.0000 mg | Freq: Once | INTRAMUSCULAR | Status: AC
Start: 2022-12-22 — End: 2022-12-22
  Administered 2022-12-22: 20 mg

## 2022-12-22 NOTE — Progress Notes (Signed)
She presents today chief complaint of pain across the dorsal aspect of her left foot.  She states that it hurts when I am walking or wearing shoes.  Objective: Vital signs are stable she is alert and oriented x 3.  Pulses are palpable.  She has pain on frontal plane range of motion of the tarsometatarsal joints inversion and eversion.  Pain on direct palpation of the dorsal aspect of the foot at the tarsometatarsal joints and just distal to those joints.  Assessment: Capsulitis osteoarthritis tarsometatarsal joints dorsum left foot.  Plan: Injected 10 mg of Kenalog 5 mg Marcaine across the dorsum of the foot today.  Tolerated procedure well patient stated that she was feeling better as she was leaving the office.

## 2023-02-10 ENCOUNTER — Encounter: Payer: Self-pay | Admitting: Podiatry

## 2023-02-10 ENCOUNTER — Ambulatory Visit (INDEPENDENT_AMBULATORY_CARE_PROVIDER_SITE_OTHER): Payer: 59 | Admitting: Podiatry

## 2023-02-10 VITALS — Ht 65.0 in | Wt 212.0 lb

## 2023-02-10 DIAGNOSIS — M19072 Primary osteoarthritis, left ankle and foot: Secondary | ICD-10-CM

## 2023-02-10 DIAGNOSIS — M19071 Primary osteoarthritis, right ankle and foot: Secondary | ICD-10-CM

## 2023-02-10 MED ORDER — MELOXICAM 15 MG PO TABS: 15.0000 mg | ORAL_TABLET | Freq: Every day | ORAL | 1 refills | Status: DC

## 2023-02-10 MED ORDER — BETAMETHASONE SOD PHOS & ACET 6 (3-3) MG/ML IJ SUSP
3.0000 mg | Freq: Once | INTRAMUSCULAR | Status: AC
Start: 2023-02-10 — End: 2023-02-10
  Administered 2023-02-10: 3 mg via INTRA_ARTICULAR

## 2023-02-10 NOTE — Progress Notes (Signed)
Chief Complaint  Patient presents with   Foot Pain    Patient here for injection bilateral, has wedding coming up okay 'd per DR.Hyatt Feels like feet have no padding would like information as per what to do    HPI: 67 y.o. female presenting today for follow-up evaluation of bilateral arthritis and pain to the bilateral feet.  Patient receives injections in the past which have helped significantly.  She has been diagnosis with DJD/osteoarthritis of the midtarsal joints bilateral left greater than the right.  Presenting for further treatment evaluation  Past Medical History:  Diagnosis Date   Abnormal ankle brachial index (ABI) 12/02/2016   ADHD (attention deficit hyperactivity disorder)    AKI (acute kidney injury) (HCC)    Allergy 2020   Can't take amlopine. Causes swelling   Anxiety    Aortic atherosclerosis (HCC) 12/02/2016   July 2018   Arthritis    Chronic back pain    Coronary artery disease    Depression 2020   I think everyone is depressed at this unprecedented time!   Encounter for HIV (human immunodeficiency virus) test    Essential (hemorrhagic) thrombocythemia (HCC) 05/08/2020   GERD (gastroesophageal reflux disease)    Herpes genitalis in women    History of stomach ulcers    x7 this year. Bleeding   HLD (hyperlipidemia)    HYPERLIPIDEMIA-MIXED    Hypertension    Prediabetes 12/02/2016   Pulmonary hypertension (HCC) 03/09/2018   Echo Oct 2019   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preiser), left (HCC)    Subclinical hypothyroidism    Subclinical hypothyroidism    Ulcer     Past Surgical History:  Procedure Laterality Date   APPENDECTOMY     CARDIAC CATHETERIZATION     CESAREAN SECTION     x2   COLON SURGERY     interception   COLONOSCOPY WITH PROPOFOL N/A 08/17/2020   Procedure: COLONOSCOPY WITH PROPOFOL;  Surgeon: Pasty Spillers, MD;  Location: [facility] ENDOSCOPY;  Service: Endoscopy;  Laterality: N/A;   CORONARY ANGIOPLASTY     stents...  2000    ESOPHAGOGASTRODUODENOSCOPY (EGD) WITH PROPOFOL N/A 09/21/2015   Procedure: ESOPHAGOGASTRODUODENOSCOPY (EGD) WITH PROPOFOL;  Surgeon: Midge Minium, MD;  Location: [facility] ENDOSCOPY;  Service: Endoscopy;  Laterality: N/A;   ESOPHAGOGASTRODUODENOSCOPY (EGD) WITH PROPOFOL N/A 07/30/2016   Procedure: ESOPHAGOGASTRODUODENOSCOPY (EGD) WITH PROPOFOL;  Surgeon: Wyline Mood, MD;  Location: [facility] ENDOSCOPY;  Service: Endoscopy;  Laterality: N/A;   ESOPHAGOGASTRODUODENOSCOPY (EGD) WITH PROPOFOL N/A 04/23/2018   Procedure: ESOPHAGOGASTRODUODENOSCOPY (EGD) WITH PROPOFOL;  Surgeon: Pasty Spillers, MD;  Location: [facility] ENDOSCOPY;  Service: Endoscopy;  Laterality: N/A;   ESOPHAGOGASTRODUODENOSCOPY (EGD) WITH PROPOFOL N/A 11/04/2019   Procedure: ESOPHAGOGASTRODUODENOSCOPY (EGD) WITH PROPOFOL;  Surgeon: Toney Reil, MD;  Location: [facility] ENDOSCOPY;  Service: Gastroenterology;  Laterality: N/A;   EYE SURGERY  Dec 2021   Droopy eye lids   LEFT HEART CATH Right 01/25/2018   Procedure: Left Heart Cath and Coronary Angiography;  Surgeon: Laurier Nancy, MD;  Location: [facility] INVASIVE CV LAB;  Service: Cardiovascular;  Laterality: Right;   LEFT HEART CATH AND CORONARY ANGIOGRAPHY Right 08/12/2016   Procedure: Left Heart Cath and Coronary Angiography;  Surgeon: Laurier Nancy, MD;  Location: [facility] INVASIVE CV LAB;  Service: Cardiovascular;  Laterality: Right;   SMALL INTESTINE SURGERY     TONSILLECTOMY     TUBAL LIGATION      Allergies  Allergen Reactions   Amlodipine     Leg swelling   Statins  Other (See Comments)     Physical Exam: General: The patient is alert and oriented x3 in no acute distress.  Dermatology: Skin is warm, dry and supple bilateral lower extremities.   Vascular: VAS [place] ABI WITH/WO TBI 05/23/2021 ABI Findings:  +---------+------------------+-----+---------+--------+  Right   Rt Pressure (mmHg)IndexWaveform Comment   +---------+------------------+-----+---------+--------+   Brachial 159                                       +---------+------------------+-----+---------+--------+  ATA     167               1.04 biphasic           +---------+------------------+-----+---------+--------+  PTA     177               1.11 triphasic          +---------+------------------+-----+---------+--------+  Great Toe142               0.89 Normal             +---------+------------------+-----+---------+--------+   +---------+------------------+-----+---------+-------+  Left    Lt Pressure (mmHg)IndexWaveform Comment  +---------+------------------+-----+---------+-------+  Brachial 160                                      +---------+------------------+-----+---------+-------+  ATA     149               0.93 triphasic         +---------+------------------+-----+---------+-------+  PTA     166               1.04 triphasic         +---------+------------------+-----+---------+-------+  Great Toe160               1.00 Normal            +---------+------------------+-----+---------+-------+  Summary:  Right: Resting right ankle-brachial index is within normal range. No evidence of significant right lower extremity arterial disease. The right toe-brachial index is normal.   Imaged and acquired Waveforms of the Right PTA,ATA and Peroneal Artery. Flow seen in Ankle level Vessels but Very Minimal in the Rt ATA. Left: Resting left ankle-brachial index is within normal range. No evidence of significant left lower extremity arterial disease. The left  toe-brachial index is normal.   Waveforms and Imaging obtained in the Left PTA,ATA and Peroneal Artery; Normal Flow seen.   Neurological: Grossly intact via light touch  Musculoskeletal Exam: No pedal deformities noted.  There is tenderness with palpation throughout the bilateral midfoot/midtarsal joints.  Pain with range of motion as well  Assessment/Plan of Care: 1.  Bilateral  midfoot DJD/arthritis  -Patient evaluated -Injection of 0.5 cc Celestone Soluspan injected the bilateral midfoot/midtarsal joints -Prescription for meloxicam 15 mg daily as needed -OTC power step insoles were dispensed today to support the medial longitudinal arch of the foot and alleviate pressure from the midtarsal joints.  Patient felt significant relief with this -Advised against going barefoot. -Return to clinic as needed       Felecia Shelling, DPM [facility]  Dr. Felecia Shelling, DPM    [address]  [place], [place] [address]                Office [phone]  Fax [phone]

## 2023-04-05 ENCOUNTER — Other Ambulatory Visit: Payer: Self-pay | Admitting: Podiatry

## 2023-04-29 ENCOUNTER — Other Ambulatory Visit: Payer: Self-pay | Admitting: Internal Medicine

## 2023-04-29 DIAGNOSIS — Z1231 Encounter for screening mammogram for malignant neoplasm of breast: Secondary | ICD-10-CM

## 2023-07-17 ENCOUNTER — Ambulatory Visit (INDEPENDENT_AMBULATORY_CARE_PROVIDER_SITE_OTHER): Admitting: Podiatry

## 2023-07-17 ENCOUNTER — Encounter: Payer: Self-pay | Admitting: Podiatry

## 2023-07-17 VITALS — Ht 65.0 in | Wt 212.0 lb

## 2023-07-17 DIAGNOSIS — M19072 Primary osteoarthritis, left ankle and foot: Secondary | ICD-10-CM

## 2023-07-17 DIAGNOSIS — M19071 Primary osteoarthritis, right ankle and foot: Secondary | ICD-10-CM | POA: Diagnosis not present

## 2023-07-17 NOTE — Progress Notes (Signed)
 Chief Complaint  Patient presents with   Foot Pain    Patient is here for bilateral foot injections HX: bilateral PF, Arthritis of midtarsal joint of right foot,Osteoarthritis of left midfoot    HPI: 68 y.o. female presenting today for follow-up evaluation of bilateral arthritis and pain to the bilateral feet.  Patient receives injections in the past which have helped significantly.  She has been diagnosis with DJD/osteoarthritis of the midtarsal joints bilateral left greater than the right.  Presenting for further treatment evaluation  Past Medical History:  Diagnosis Date   Abnormal ankle brachial index (ABI) 12/02/2016   ADHD (attention deficit hyperactivity disorder)    AKI (acute kidney injury) (HCC)    Allergy 2020   Can't take amlopine. Causes swelling   Anxiety    Aortic atherosclerosis (HCC) 12/02/2016   July 2018   Arthritis    Chronic back pain    Coronary artery disease    Depression 2020   I think everyone is depressed at this unprecedented time!   Encounter for HIV (human immunodeficiency virus) test    Essential (hemorrhagic) thrombocythemia (HCC) 05/08/2020   GERD (gastroesophageal reflux disease)    Herpes genitalis in women    History of stomach ulcers    x7 this year. Bleeding   HLD (hyperlipidemia)    HYPERLIPIDEMIA-MIXED    Hypertension    Prediabetes 12/02/2016   Pulmonary hypertension (HCC) 03/09/2018   Echo Oct 2019   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preiser), left (HCC)    Subclinical hypothyroidism    Subclinical hypothyroidism    Ulcer     Past Surgical History:  Procedure Laterality Date   APPENDECTOMY     CARDIAC CATHETERIZATION     CESAREAN SECTION     x2   COLON SURGERY     interception   COLONOSCOPY WITH PROPOFOL  N/A 08/17/2020   Procedure: COLONOSCOPY WITH PROPOFOL ;  Surgeon: Irby Mannan, MD;  Location: [facility] ENDOSCOPY;  Service: Endoscopy;  Laterality: N/A;   CORONARY ANGIOPLASTY     stents...  2000   ESOPHAGOGASTRODUODENOSCOPY  (EGD) WITH PROPOFOL  N/A 09/21/2015   Procedure: ESOPHAGOGASTRODUODENOSCOPY (EGD) WITH PROPOFOL ;  Surgeon: Marnee Sink, MD;  Location: [facility] ENDOSCOPY;  Service: Endoscopy;  Laterality: N/A;   ESOPHAGOGASTRODUODENOSCOPY (EGD) WITH PROPOFOL  N/A 07/30/2016   Procedure: ESOPHAGOGASTRODUODENOSCOPY (EGD) WITH PROPOFOL ;  Surgeon: Luke Salaam, MD;  Location: [facility] ENDOSCOPY;  Service: Endoscopy;  Laterality: N/A;   ESOPHAGOGASTRODUODENOSCOPY (EGD) WITH PROPOFOL  N/A 04/23/2018   Procedure: ESOPHAGOGASTRODUODENOSCOPY (EGD) WITH PROPOFOL ;  Surgeon: Irby Mannan, MD;  Location: [facility] ENDOSCOPY;  Service: Endoscopy;  Laterality: N/A;   ESOPHAGOGASTRODUODENOSCOPY (EGD) WITH PROPOFOL  N/A 11/04/2019   Procedure: ESOPHAGOGASTRODUODENOSCOPY (EGD) WITH PROPOFOL ;  Surgeon: Selena Daily, MD;  Location: [facility] ENDOSCOPY;  Service: Gastroenterology;  Laterality: N/A;   EYE SURGERY  Dec 2021   Droopy eye lids   LEFT HEART CATH Right 01/25/2018   Procedure: Left Heart Cath and Coronary Angiography;  Surgeon: Cherrie Cornwall, MD;  Location[facility] INVASIVE CV LAB;  Service: Cardiovascular;  Laterality: Right;   LEFT HEART CATH AND CORONARY ANGIOGRAPHY Right 08/12/2016   Procedure: Left Heart Cath and Coronary Angiography;  Surgeon: Cherrie Cornwall, MD;  Location: [facility] INVASIVE CV LAB;  Service: Cardiovascular;  Laterality: Right;   SMALL INTESTINE SURGERY     TONSILLECTOMY     TUBAL LIGATION      Allergies  Allergen Reactions   Amlodipine      Leg swelling   Statins Other (See Comments)  Physical Exam: General: The patient is alert and oriented x3 in no acute distress.  Dermatology: Skin is warm, dry and supple bilateral lower extremities.   Vascular: VAS US  ABI WITH/WO TBI 05/23/2021 ABI Findings:  +---------+------------------+-----+---------+--------+  Right   Rt Pressure (mmHg)IndexWaveform Comment   +---------+------------------+-----+---------+--------+  Brachial 159                                        +---------+------------------+-----+---------+--------+  ATA     167               1.04 biphasic           +---------+------------------+-----+---------+--------+  PTA     177               1.11 triphasic          +---------+------------------+-----+---------+--------+  Great Toe142               0.89 Normal             +---------+------------------+-----+---------+--------+   +---------+------------------+-----+---------+-------+  Left    Lt Pressure (mmHg)IndexWaveform Comment  +---------+------------------+-----+---------+-------+  Brachial 160                                      +---------+------------------+-----+---------+-------+  ATA     149               0.93 triphasic         +---------+------------------+-----+---------+-------+  PTA     166               1.04 triphasic         +---------+------------------+-----+---------+-------+  Great Toe160               1.00 Normal            +---------+------------------+-----+---------+-------+  Summary:  Right: Resting right ankle-brachial index is within normal range. No evidence of significant right lower extremity arterial disease. The right toe-brachial index is normal.   Imaged and acquired Waveforms of the Right PTA,ATA and Peroneal Artery. Flow seen in Ankle level Vessels but Very Minimal in the Rt ATA. Left: Resting left ankle-brachial index is within normal range. No evidence of significant left lower extremity arterial disease. The left  toe-brachial index is normal.   Waveforms and Imaging obtained in the Left PTA,ATA and Peroneal Artery; Normal Flow seen.   Neurological: Grossly intact via light touch  Musculoskeletal Exam: No pedal deformities noted.  There is tenderness with palpation throughout the bilateral midfoot/midtarsal joints.  Pain with range of motion as well  Assessment/Plan of Care: 1.  Bilateral midfoot DJD/arthritis  -Patient  evaluated -Injection of 0.5 cc Celestone  Soluspan injected the bilateral midfoot/midtarsal joints - Continue meloxicam  15 mg daily as needed - Continue OTC power step insoles that were dispensed today to support the medial longitudinal arch of the foot and alleviate pressure from the midtarsal joints.  Patient felt significant relief with this -Advised against going barefoot. -Return to clinic as needed       Dot Gazella, DPM [facility]  Dr. Dot Gazella, DPM    [address]  [place], [place] [address]                Office [phone]  Fax [phone]

## 2023-08-04 DIAGNOSIS — M19072 Primary osteoarthritis, left ankle and foot: Secondary | ICD-10-CM | POA: Diagnosis not present

## 2023-08-04 DIAGNOSIS — M19071 Primary osteoarthritis, right ankle and foot: Secondary | ICD-10-CM | POA: Diagnosis not present

## 2023-08-04 MED ORDER — BETAMETHASONE SOD PHOS & ACET 6 (3-3) MG/ML IJ SUSP
3.0000 mg | Freq: Once | INTRAMUSCULAR | Status: AC
  Administered 2023-08-04: 3 mg via INTRA_ARTICULAR

## 2023-08-18 ENCOUNTER — Encounter

## 2023-08-21 ENCOUNTER — Ambulatory Visit
Admission: RE | Admit: 2023-08-21 | Discharge: 2023-08-21 | Disposition: A | Discharge status: Home / Self Care | Source: Ambulatory Visit | Attending: Internal Medicine | Admitting: Internal Medicine

## 2023-08-21 DIAGNOSIS — Z1231 Encounter for screening mammogram for malignant neoplasm of breast: Secondary | ICD-10-CM | POA: Diagnosis present

## 2023-09-20 ENCOUNTER — Other Ambulatory Visit: Payer: Self-pay

## 2023-09-20 ENCOUNTER — Emergency Department

## 2023-09-20 ENCOUNTER — Emergency Department: Admission: EM | Admit: 2023-09-20 | Discharge: 2023-09-20 | Disposition: A | Discharge status: Home / Self Care

## 2023-09-20 DIAGNOSIS — I2510 Atherosclerotic heart disease of native coronary artery without angina pectoris: Secondary | ICD-10-CM | POA: Insufficient documentation

## 2023-09-20 DIAGNOSIS — R0789 Other chest pain: Secondary | ICD-10-CM | POA: Insufficient documentation

## 2023-09-20 DIAGNOSIS — I10 Essential (primary) hypertension: Secondary | ICD-10-CM | POA: Diagnosis not present

## 2023-09-20 LAB — CBC
HCT: 37 % (ref 36.0–46.0)
Hemoglobin: 12.1 g/dL (ref 12.0–15.0)
MCH: 31.9 pg (ref 26.0–34.0)
MCHC: 32.7 g/dL (ref 30.0–36.0)
MCV: 97.6 fL (ref 80.0–100.0)
Platelets: 372 10*3/uL (ref 150–400)
RBC: 3.79 MIL/uL — ABNORMAL LOW (ref 3.87–5.11)
RDW: 12.6 % (ref 11.5–15.5)
WBC: 7.1 10*3/uL (ref 4.0–10.5)
nRBC: 0 % (ref 0.0–0.2)

## 2023-09-20 LAB — BASIC METABOLIC PANEL WITH GFR
Anion gap: 10 (ref 5–15)
BUN: 26 mg/dL — ABNORMAL HIGH (ref 8–23)
CO2: 22 mmol/L (ref 22–32)
Calcium: 8.1 mg/dL — ABNORMAL LOW (ref 8.9–10.3)
Chloride: 103 mmol/L (ref 98–111)
Creatinine, Ser: 1.16 mg/dL — ABNORMAL HIGH (ref 0.44–1.00)
GFR, Estimated: 52 mL/min — ABNORMAL LOW (ref >60)
Glucose, Bld: 104 mg/dL — ABNORMAL HIGH (ref 70–99)
Potassium: 4.6 mmol/L (ref 3.5–5.1)
Sodium: 135 mmol/L (ref 135–145)

## 2023-09-20 LAB — HEPATIC FUNCTION PANEL
ALT: 16 U/L (ref 0–44)
AST: 23 U/L (ref 15–41)
Albumin: 3.9 g/dL (ref 3.5–5.0)
Alkaline Phosphatase: 42 U/L (ref 38–126)
Bilirubin, Direct: <0.1 mg/dL (ref 0.0–0.2)
Total Bilirubin: 0.4 mg/dL (ref 0.0–1.2)
Total Protein: 6.9 g/dL (ref 6.5–8.1)

## 2023-09-20 LAB — LIPASE, BLOOD: Lipase: 68 U/L — ABNORMAL HIGH (ref 11–51)

## 2023-09-20 LAB — TROPONIN I (HIGH SENSITIVITY)
Troponin I (High Sensitivity): 6 ng/L (ref <18)
Troponin I (High Sensitivity): 6 ng/L (ref <18)

## 2023-09-20 NOTE — ED Provider Notes (Signed)
 [facility] Provider Note    Event Date/Time   First MD Initiated Contact with Patient 09/20/23 1401     (approximate)   History   Chest Pain  Pt to ED from [facility] for chest pain, 'pinching' and 'stabbing' below R breast since 1 week. Also mid upper abdominal pain since 1 week. Also some R shoulder, neck and arm pain intermittently this week. Hx CAD. Took 1 NTG 3 days ago. At this moment pain is in R shoulder, not chest. Denies dizziness, nausea. Skin is dry. Has been having some 'buzzing' feeling to L foot this week, has upcoming appt with podiatrist. No blood thinners.   HPI Linda Mejia is a 68 y.o. female PMH hypertension, hyperlipidemia, CAD presents for evaluation of chest pain. -Patient noticed very brief twinges (1-3 sec) of chest discomfort behind her right lateral breast yesterday when she was going through her kitchen cabinets.  Notes she has been very active over the past week doing more physical exertion than usual.  Is not having any exertional chest discomfort while walking, climbing stairs etc.  Patient did wake this morning around 5 AM with mild epigastric discomfort, had gone out to dinner the night before for her birthday.  No ongoing discomfort, lasted only few minutes.  Told her daughter about this who became concerned and asked patient to come to hospital for evaluation.  Went to clinic, sent to ED for further eval. - Tells me this feels very different than prior heart problems.  Was admitted to outside hospital in October 2022 - No pleuritic discomfort, no history of DVT/PE, no recent surgery/tissue/travel, no hormone use, no leg swelling  Per chart review of outside hospitalization in 01/2021, concern for unstable angina at that time, stress test with reversible defect, cath performed with proximal LAD stenosis, stented proximal to previous stent      Physical Exam   Triage Vital Signs: ED Triage Vitals  Encounter Vitals Group     BP 09/20/23  1335 (!) 147/70     Systolic BP Percentile --      Diastolic BP Percentile --      Pulse Rate 09/20/23 1335 72     Resp 09/20/23 1335 20     Temp 09/20/23 1335 98.6 F (37 C)     Temp Source 09/20/23 1335 Oral     SpO2 09/20/23 1335 94 %     Weight 09/20/23 1333 185 lb (83.9 kg)     Height 09/20/23 1333 5\' 5"  (1.651 m)     Head Circumference --      Peak Flow --      Pain Score 09/20/23 1330 4     Pain Loc --      Pain Education --      Exclude from Growth Chart --     Most recent vital signs: Vitals:   09/20/23 1335 09/20/23 1415  BP: (!) 147/70   Pulse: 72   Resp: 20   Temp: 98.6 F (37 C)   SpO2: 94% 93%     General: Awake, no distress.  CV:  Good peripheral perfusion. RRR, RP 2+ Resp:  Normal effort. CTAB Abd:  No distention. Nontender to deep palpation throughout Other:     ED Results / Procedures / Treatments   Labs (all labs ordered are listed, but only abnormal results are displayed) Labs Reviewed  BASIC METABOLIC PANEL WITH GFR - Abnormal; Notable for the following components:      Result Value  Glucose, Bld 104 (*)    BUN 26 (*)    Creatinine, Ser 1.16 (*)    Calcium  8.1 (*)    GFR, Estimated 52 (*)    All other components within normal limits  CBC - Abnormal; Notable for the following components:   RBC 3.79 (*)    All other components within normal limits  LIPASE, BLOOD - Abnormal; Notable for the following components:   Lipase 68 (*)    All other components within normal limits  HEPATIC FUNCTION PANEL  TROPONIN I (HIGH SENSITIVITY)  TROPONIN I (HIGH SENSITIVITY)     EKG  See ED course below.    RADIOLOGY Radiology interpreted by myself and radiology report reviewed.    PROCEDURES:  Critical Care performed: No  Procedures   MEDICATIONS ORDERED IN ED: Medications - No data to display   IMPRESSION / MDM / ASSESSMENT AND PLAN / ED COURSE  I reviewed the triage vital signs and the nursing notes.                               DDX/MDM/AP: Differential diagnosis includes, but is not limited to, ACS, dyspepsia, MSK strain.  Do not clinically suspect PE.  Plan: -EKG - Chest x-ray - Labs - Reassess  Patient's presentation is most consistent with acute presentation with potential threat to life or bodily function.  The patient is on the cardiac monitor to evaluate for evidence of arrhythmia and/or significant heart rate changes.  ED course below.  Workup unremarkable, patient feels very well and is requesting discharge home.  Is amenable to staying for second troponin--will proceed with discharge assuming unremarkable.  Does have elevated heart score at baseline though presentation is atypical here and I do doubt cardiac etiology.  Patient declines hospital admission and would strongly prefer to follow-up outpatient which I believe is reasonable.  Signed out to oncoming ED provider pending repeat troponin.  Clinical Course as of 09/20/23 1612  Sun Sep 20, 2023  1405 BMP reviewed, overall unremarkable, very mild bump in creatinine  CBC reviewed, unremarkable [MM]  1407 Trop wnl [MM]  1407 Chest x-ray reviewed, unremarkable [MM]  1407 Ecg = sinus rhythm, rate 73, no ST elevation or depression, no significant repolarization abnormality, normal axis, normal intervals.  No evidence of ischemia nor arrhythmia on my interpretation. [MM]    Clinical Course User Index [MM] Collis Deaner, MD     FINAL CLINICAL IMPRESSION(S) / ED DIAGNOSES   Final diagnoses:  Atypical chest pain     Rx / DC Orders   ED Discharge Orders     None        Note:  This document was prepared using Dragon voice recognition software and may include unintentional dictation errors.   Collis Deaner, MD 09/20/23 [phone]

## 2023-09-20 NOTE — Discharge Instructions (Addendum)
 Your evaluation in the emergency department is overall reassuring.  I am unsure as to the exact cause of your brief episodes of discomfort, but your heart testing today was normal.  As discussed, it is important that you follow-up with your primary provider and cardiologist for reevaluation.  Return to the emergency department with any new or worsening symptoms.

## 2023-09-20 NOTE — ED Triage Notes (Signed)
 Pt to ED from [facility] for chest pain, 'pinching' and 'stabbing' below R breast since 1 week. Also mid upper abdominal pain since 1 week. Also some R shoulder, neck and arm pain intermittently this week. Hx CAD. Took 1 NTG 3 days ago. At this moment pain is in R shoulder, not chest. Denies dizziness, nausea. Skin is dry. Has been having some 'buzzing' feeling to L foot this week, has upcoming appt with podiatrist. No blood thinners.

## 2023-09-20 NOTE — ED Provider Notes (Signed)
 Care of this patient assumed from prior physician at 1500 pending repeat troponin disposition. Please see prior physician note for further details.  Briefly this is a 68 year old female with history of CAD who presented with brief episodes of chest pain yesterday.  Does not feel like prior ischemic pain.  Initial workup reassuring including negative troponin.  Minimally elevated lipase, not consistent with acute pancreatitis.  Signed out to me pending second troponin.  If negative, anticipated discharge with outpatient follow-up.  4:43 PM Repeat troponin remained within normal limits.  Patient reassessed.  She remains pain-free.  She is comfortable with discharge home and outpatient follow-up.  Strict return precautions provided.   Claria Crofts, MD 09/20/23 [phone]

## 2023-09-22 ENCOUNTER — Encounter: Payer: Self-pay | Admitting: Podiatry

## 2023-09-22 ENCOUNTER — Ambulatory Visit (INDEPENDENT_AMBULATORY_CARE_PROVIDER_SITE_OTHER): Admitting: Podiatry

## 2023-09-22 VITALS — Ht 65.0 in | Wt 185.0 lb

## 2023-09-22 DIAGNOSIS — M19072 Primary osteoarthritis, left ankle and foot: Secondary | ICD-10-CM

## 2023-09-22 DIAGNOSIS — M19071 Primary osteoarthritis, right ankle and foot: Secondary | ICD-10-CM | POA: Diagnosis not present

## 2023-09-22 DIAGNOSIS — M5416 Radiculopathy, lumbar region: Secondary | ICD-10-CM

## 2023-09-22 MED ORDER — METHYLPREDNISOLONE 4 MG PO TBPK
ORAL_TABLET | ORAL | 0 refills | Status: AC
Start: 2023-09-22 — End: ?

## 2023-09-22 NOTE — Progress Notes (Signed)
 Chief Complaint  Patient presents with   Injections    Pt is here to get injection in both her feet due to pain.    HPI: 68 y.o. female presenting today for follow-up evaluation of bilateral arthritis and pain to the bilateral feet.  Patient receives injections in the past which have helped significantly.  She has been diagnosis with DJD/osteoarthritis of the midtarsal joints bilateral left greater than the right.  Presenting for further treatment evaluation  Past Medical History:  Diagnosis Date   Abnormal ankle brachial index (ABI) 12/02/2016   ADHD (attention deficit hyperactivity disorder)    AKI (acute kidney injury) (HCC)    Allergy 2020   Can't take amlopine. Causes swelling   Anxiety    Aortic atherosclerosis (HCC) 12/02/2016   July 2018   Arthritis    Chronic back pain    Coronary artery disease    Depression 2020   I think everyone is depressed at this unprecedented time!   Encounter for HIV (human immunodeficiency virus) test    Essential (hemorrhagic) thrombocythemia (HCC) 05/08/2020   GERD (gastroesophageal reflux disease)    Herpes genitalis in women    History of stomach ulcers    x7 this year. Bleeding   HLD (hyperlipidemia)    HYPERLIPIDEMIA-MIXED    Hypertension    Prediabetes 12/02/2016   Pulmonary hypertension (HCC) 03/09/2018   Echo Oct 2019   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preiser), left (HCC)    Subclinical hypothyroidism    Subclinical hypothyroidism    Ulcer     Past Surgical History:  Procedure Laterality Date   APPENDECTOMY     CARDIAC CATHETERIZATION     CESAREAN SECTION     x2   COLON SURGERY     interception   COLONOSCOPY WITH PROPOFOL  N/A 08/17/2020   Procedure: COLONOSCOPY WITH PROPOFOL ;  Surgeon: Janalyn Keene NOVAK, MD;  Location: [facility] ENDOSCOPY;  Service: Endoscopy;  Laterality: N/A;   CORONARY ANGIOPLASTY     stents...  2000   ESOPHAGOGASTRODUODENOSCOPY (EGD) WITH PROPOFOL  N/A 09/21/2015   Procedure: ESOPHAGOGASTRODUODENOSCOPY  (EGD) WITH PROPOFOL ;  Surgeon: Rogelia Copping, MD;  Location: [facility] ENDOSCOPY;  Service: Endoscopy;  Laterality: N/A;   ESOPHAGOGASTRODUODENOSCOPY (EGD) WITH PROPOFOL  N/A 07/30/2016   Procedure: ESOPHAGOGASTRODUODENOSCOPY (EGD) WITH PROPOFOL ;  Surgeon: Ruel Kung, MD;  Location: [facility] ENDOSCOPY;  Service: Endoscopy;  Laterality: N/A;   ESOPHAGOGASTRODUODENOSCOPY (EGD) WITH PROPOFOL  N/A 04/23/2018   Procedure: ESOPHAGOGASTRODUODENOSCOPY (EGD) WITH PROPOFOL ;  Surgeon: Janalyn Keene NOVAK, MD;  Location: [facility] ENDOSCOPY;  Service: Endoscopy;  Laterality: N/A;   ESOPHAGOGASTRODUODENOSCOPY (EGD) WITH PROPOFOL  N/A 11/04/2019   Procedure: ESOPHAGOGASTRODUODENOSCOPY (EGD) WITH PROPOFOL ;  Surgeon: Unk Corinn Skiff, MD;  Location: [facility] ENDOSCOPY;  Service: Gastroenterology;  Laterality: N/A;   EYE SURGERY  Dec 2021   Droopy eye lids   LEFT HEART CATH Right 01/25/2018   Procedure: Left Heart Cath and Coronary Angiography;  Surgeon: Fernand Denyse LABOR, MD;  Location: [facility] INVASIVE CV LAB;  Service: Cardiovascular;  Laterality: Right;   LEFT HEART CATH AND CORONARY ANGIOGRAPHY Right 08/12/2016   Procedure: Left Heart Cath and Coronary Angiography;  Surgeon: Denyse LABOR Fernand, MD;  Location: [facility] INVASIVE CV LAB;  Service: Cardiovascular;  Laterality: Right;   SMALL INTESTINE SURGERY     TONSILLECTOMY     TUBAL LIGATION      Allergies  Allergen Reactions   Amlodipine      Leg swelling   Statins Other (See Comments)     Physical Exam: General: The patient is alert and  oriented x3 in no acute distress.  Dermatology: Skin is warm, dry and supple bilateral lower extremities.   Vascular: VAS US  ABI WITH/WO TBI 05/23/2021 ABI Findings:  +---------+------------------+-----+---------+--------+  Right   Rt Pressure (mmHg)IndexWaveform Comment   +---------+------------------+-----+---------+--------+  Brachial 159                                        +---------+------------------+-----+---------+--------+  ATA     167               1.04 biphasic           +---------+------------------+-----+---------+--------+  PTA     177               1.11 triphasic          +---------+------------------+-----+---------+--------+  Great Toe142               0.89 Normal             +---------+------------------+-----+---------+--------+   +---------+------------------+-----+---------+-------+  Left    Lt Pressure (mmHg)IndexWaveform Comment  +---------+------------------+-----+---------+-------+  Brachial 160                                      +---------+------------------+-----+---------+-------+  ATA     149               0.93 triphasic         +---------+------------------+-----+---------+-------+  PTA     166               1.04 triphasic         +---------+------------------+-----+---------+-------+  Great Toe160               1.00 Normal            +---------+------------------+-----+---------+-------+  Summary:  Right: Resting right ankle-brachial index is within normal range. No evidence of significant right lower extremity arterial disease. The right toe-brachial index is normal.   Imaged and acquired Waveforms of the Right PTA,ATA and Peroneal Artery. Flow seen in Ankle level Vessels but Very Minimal in the Rt ATA. Left: Resting left ankle-brachial index is within normal range. No evidence of significant left lower extremity arterial disease. The left  toe-brachial index is normal.   Waveforms and Imaging obtained in the Left PTA,ATA and Peroneal Artery; Normal Flow seen.   Neurological: Grossly intact via light touch  Musculoskeletal Exam: No pedal deformities noted.  There is tenderness with palpation throughout the bilateral midfoot/midtarsal joints.  Pain with range of motion as well  Assessment/Plan of Care: 1.  Bilateral midfoot DJD/arthritis 2.  Chronic lumbar  radiculopathy  -Patient evaluated -Injection of 0.5 cc Celestone  Soluspan injected the bilateral midfoot/midtarsal joints - Continue meloxicam  15 mg daily as needed - Continue OTC power step insoles that were dispensed today to support the medial longitudinal arch of the foot and alleviate pressure from the midtarsal joints.  Patient felt significant relief with this -Advised against going barefoot. -Referral placed for neurosurgery to evaluate  chronic lumbar back pain -Return to clinic as needed       Thresa EMERSON Sar, DPM [facility]  Dr. Thresa EMERSON Sar, DPM    [address]  [place], [place] [address]                Office [phone]  Fax [phone]

## 2023-10-05 DIAGNOSIS — M19071 Primary osteoarthritis, right ankle and foot: Secondary | ICD-10-CM

## 2023-10-05 DIAGNOSIS — M19072 Primary osteoarthritis, left ankle and foot: Secondary | ICD-10-CM | POA: Diagnosis not present

## 2023-10-05 MED ORDER — BETAMETHASONE SOD PHOS & ACET 6 (3-3) MG/ML IJ SUSP
3.0000 mg | Freq: Once | INTRAMUSCULAR | Status: AC
  Administered 2023-10-05: 3 mg via INTRA_ARTICULAR

## 2023-11-13 ENCOUNTER — Other Ambulatory Visit: Payer: Self-pay | Admitting: Internal Medicine

## 2023-11-13 DIAGNOSIS — F17210 Nicotine dependence, cigarettes, uncomplicated: Secondary | ICD-10-CM

## 2023-11-30 ENCOUNTER — Ambulatory Visit

## 2023-12-02 ENCOUNTER — Ambulatory Visit
Admission: RE | Admit: 2023-12-02 | Discharge: 2023-12-02 | Disposition: A | Discharge status: Home / Self Care | Source: Ambulatory Visit | Attending: Internal Medicine | Admitting: Internal Medicine

## 2023-12-02 DIAGNOSIS — F17210 Nicotine dependence, cigarettes, uncomplicated: Secondary | ICD-10-CM | POA: Insufficient documentation

## 2024-02-03 ENCOUNTER — Other Ambulatory Visit: Payer: Self-pay | Admitting: Family Medicine

## 2024-02-03 DIAGNOSIS — R936 Abnormal findings on diagnostic imaging of limbs: Secondary | ICD-10-CM

## 2024-02-05 ENCOUNTER — Ambulatory Visit: Admission: RE | Admit: 2024-02-05 | Source: Home / Self Care | Admitting: Gastroenterology

## 2024-02-05 ENCOUNTER — Encounter: Admission: RE | Payer: Self-pay | Source: Home / Self Care

## 2024-02-05 SURGERY — COLONOSCOPY
Anesthesia: General

## 2024-02-08 ENCOUNTER — Ambulatory Visit
Admission: RE | Admit: 2024-02-08 | Discharge: 2024-02-08 | Disposition: A | Discharge status: Home / Self Care | Source: Ambulatory Visit | Attending: Family Medicine | Admitting: Family Medicine

## 2024-02-08 DIAGNOSIS — R936 Abnormal findings on diagnostic imaging of limbs: Secondary | ICD-10-CM | POA: Diagnosis present
